# Patient Record
Sex: Male | Born: 1970
Health system: Southern US, Community
[De-identification: ages and names within clinical notes are randomized; demographics above are authoritative.]

## PROBLEM LIST (undated history)

## (undated) DIAGNOSIS — N186 End stage renal disease: Secondary | ICD-10-CM

## (undated) DIAGNOSIS — E079 Disorder of thyroid, unspecified: Secondary | ICD-10-CM

## (undated) DIAGNOSIS — N189 Chronic kidney disease, unspecified: Secondary | ICD-10-CM

## (undated) DIAGNOSIS — Z992 Dependence on renal dialysis: Secondary | ICD-10-CM

## (undated) DIAGNOSIS — IMO0002 Reserved for concepts with insufficient information to code with codable children: Secondary | ICD-10-CM

## (undated) DIAGNOSIS — M329 Systemic lupus erythematosus, unspecified: Secondary | ICD-10-CM

## (undated) HISTORY — DX: Chronic kidney disease, unspecified: N18.9

## (undated) HISTORY — DX: Disorder of thyroid, unspecified: E07.9

---

## 2004-05-10 ENCOUNTER — Ambulatory Visit (HOSPITAL_COMMUNITY): Admission: RE | Admit: 2004-05-10 | Discharge: 2004-05-10 | Payer: Self-pay | Admitting: Emergency Medicine

## 2004-05-10 ENCOUNTER — Ambulatory Visit: Payer: Self-pay | Admitting: Cardiology

## 2004-05-10 ENCOUNTER — Inpatient Hospital Stay (HOSPITAL_COMMUNITY): Admission: AD | Admit: 2004-05-10 | Discharge: 2004-05-13 | Payer: Self-pay | Admitting: Cardiology

## 2004-05-12 ENCOUNTER — Encounter: Payer: Self-pay | Admitting: Cardiology

## 2004-06-24 ENCOUNTER — Ambulatory Visit: Payer: Self-pay | Admitting: Cardiology

## 2006-11-21 ENCOUNTER — Emergency Department (HOSPITAL_COMMUNITY): Admission: EM | Admit: 2006-11-21 | Discharge: 2006-11-21 | Payer: Self-pay | Admitting: Emergency Medicine

## 2006-11-23 ENCOUNTER — Emergency Department (HOSPITAL_COMMUNITY): Admission: EM | Admit: 2006-11-23 | Discharge: 2006-11-23 | Payer: Self-pay | Admitting: Emergency Medicine

## 2007-09-14 ENCOUNTER — Ambulatory Visit: Payer: Self-pay | Admitting: Family Medicine

## 2007-09-14 ENCOUNTER — Inpatient Hospital Stay (HOSPITAL_COMMUNITY): Admission: AD | Admit: 2007-09-14 | Discharge: 2007-09-15 | Payer: Self-pay | Admitting: Family Medicine

## 2007-09-15 ENCOUNTER — Encounter: Payer: Self-pay | Admitting: Family Medicine

## 2010-09-09 NOTE — Discharge Summary (Signed)
[name], [name]               ACCOUNT NO.:  [id number]   MEDICAL RECORD NO.:  [id number]          PATIENT TYPE:  INP   LOCATION:  [id number]                         FACILITY:  [facility]   PHYSICIAN:  Talbert Cage, M.D.DATE OF BIRTH:  1971/03/31   DATE OF ADMISSION:  09/14/2007  DATE OF DISCHARGE:  09/15/2007                               DISCHARGE SUMMARY   .   REASON FOR HOSPITALIZATION:  Shortness of breath and right-sided chest  pain.   DISCHARGE DIAGNOSIS:  Lupus-related pleuritis with right-sided pleural  effusion.   ADDITIONAL DIAGNOSES:  1. Systemic lupus erythematosus.  2. Cardiomegaly.  3. Recurrent lupus pleuritis.   DISCHARGE MEDICATIONS:  1. Plaquenil 200 mg p.o. daily.  2. Prednisone 60 mg p.o. daily for the next 10 days.  3. Ibuprofen 800 mg three times daily for the next 10 days.  4. Percocet 5/325 mg 1-2 tabs every 4-6 hours as needed for chest      pain.   DISCHARGE INSTRUCTIONS:  1. The patient is taking medications as mentioned previously.  2. The patient is seeking medical attention for worsening chest pain,      shortness of breath, feeling like he will pass out, or any other      concerning symptoms.  3. The patient is to call Dr. Elmon Else office to seek followup      appointment sooner than it scheduled in August, preferably within      the next 2-3 weeks.   SIGNIFICANT FINDINGS:  1. Admission workup.  The patient presented as a direct admission from      [facility] with an outside chest x-ray showed a right-sided      pleural effusion.  Chest x-ray was repeated at [facility] and      corroborated the right-sided pleural effusion that was at moderate      size.  Cardiomegaly was also demonstrated on this chest x-ray.      This looked to me like an interval decrease from previous chest x-      ray that was done in July 2008, although that chest x-ray showed      cardiomegaly as well.  Lateral decubitus film was obtained and      showed  free-flowing right-sided pleural effusion.  Laboratory      workup showed a CBC with a white count of 5.5, hemoglobin of 12.5,      and platelet count of 304.  Complete metabolic panel was completely      within normal limits except for slightly low sodium at 134 and      slightly low potassium at 3.4 and a slightly low albumin at 3.0.      Cardiac enzymes were cycled x1 and found to be negative.      Erythrocyte sedimentation rate was elevated at 91 and C-reactive      protein was also elevated at 17.9.  2. Further inpatient workup given the cardiomegaly demonstrated on      chest x-ray.  The patient was sent for echocardiogram at the time      of this dictation.  That echocardiogram has not been read and will      be pending at the time of the patient's discharge.   BRIEF HOSPITAL COURSE:  The patient is a 40 year old man diagnosed with  lupus in 2006.  He presented to [facility] with shortness of  breath and oxygen saturation of 91%.  The patient states that for the  previous 2-3 days, he has not been able to a deep breath without sharp  right-sided pain.  He has also not been able to lay flat.  He denied any  chest heaviness or chest pressure.  Given the patient's past history of  pleural effusions of the same sort that had responded well to  prednisone, the patient was admitted and placed on oral prednisone 60 mg  once daily.  The patient was also placed on IV Toradol scheduled and was  given morphine and Percocet for pain control.  The patient did well with  this regimen and by hospital day #2 was breathing more comfortably.  He  had stable oxygen saturations and stable vital signs.  The patient  remained afebrile throughout his hospital course, did not demonstrate  any rash or indication of an infectious etiology.  Given the patient's  demonstrated cardiomegaly on chest x-ray that looked like it possibly  might  be marginally increased from previous chest x-ray in July  2008.  Cardiac echocardiogram was ordered.  EKG was performed on this admission  and did not show findings consistent with pericarditis, pericardial  effusion, myocarditis, or other cardiac myopathy.  Echocardiogram was  ordered to evaluate chest x-ray findings further and provided a baseline  for the patient showed subsequent evaluation and workup be necessary.  The patient did not demonstrate any signs of an infected pericarditis or  any other significant cardiac pathology.  On hospital day #2, the  patient was discharged to complete a 9-day course of prednisone with  instructions to call Dr. Dr. Elmon Else office to see if he could be seen  before his next scheduled appointment in August.  The patient expresses  agreement and understanding with this plan and is agreeable to discharge  to home.   PROCEDURES:  None.   CONSULTATIONS:  None.   ISSUES FOR FOLLOWUP:  Pending issues at the time of discharge.  The  patient has cardiac echo that was performed on the date of discharge,  Sep 15, 2007.  His cardiac echo has not been read yet.  We have asked  our echo lab to fax a copy of the read to Dr. Elmon Else office.  They  have agreed to do this but if Dr. Charlestine Night has not received this fax, he  should call [facility] to have a copy of this read sent to his  office.   DISCHARGE CONDITION:  Stable, good.   DISPOSITION:  The patient is discharged to home.      Eugenie Norrie, MD  Electronically Signed      Talbert Cage, M.D.  Electronically Signed    TE/MEDQ  D:  09/15/2007  T:  09/16/2007  Job:  [id number]

## 2010-09-09 NOTE — H&P (Signed)
[name], [name]               ACCOUNT NO.:  [id number]   MEDICAL RECORD NO.:  [id number]          PATIENT TYPE:  INP   LOCATION:  [id number]                         FACILITY:  [facility]   PHYSICIAN:  Blane Ohara McDiarmid, M.D.DATE OF BIRTH:  1970/07/17   DATE OF ADMISSION:  09/14/2007  DATE OF DISCHARGE:                              HISTORY & PHYSICAL   REASON FOR HOSPITALIZATION:  Shortness of breath with reported right  pleural effusion on outside chest x-ray.   HISTORY OF PRESENT ILLNESS:  The patient is a 40 year old male diagnosed  with lupus in 2006, who presents as a direct admit from [facility] with a complaint of shortness of breath and chest pain on deep  inspiration.  Chest x-ray at [facility] showed a right pleural  effusion with cardiomegaly and an O2 sat was performed there that read  91% on room air.  [facility] doctor discussed the patient with  his rheumatologist, Dr. Charlestine Night, who advised admission directly to [facility].  The patient reports a sharp right-sided chest  pain with deep inspiration, radiating to his right shoulder.  He also  complains of shortness of breath with lying supine.  He has to maintain  a posture of 90 degrees upright to keep from getting short of breath.  The patient denies any chest pressure or heaviness.  He denies any  nausea and vomiting.  He does report subjective chills and sweats.  Denies fevers.  He denies a rash or history of rash with his history of  lupus.  The patient denies any problems with gait or speech.  He states  that his pain is almost gone  as long as he sits upright and he is  taking shallow breath.  The patient reports he had an episode similar to  this about 1 year ago that responded well to a course of prednisone.  The patient thinks the flares associated with his lupus might come on  with increased stress, which has been the case for him lately, as he has  had an increased work  load and stress at work.   ALLERGIES:  No known drug allergies.   MEDICATIONS:  1. Plaquenil 200 mg p.o. daily.  2. Ibuprofen as needed.   PAST MEDICAL HISTORY:  1. Lupus, diagnosed in 2006.  2. Cardiomegaly.  3. History of lupus.  4. Pneumonitis.   FAMILY HISTORY:  Negative for hypertension, coronary artery disease, and  diabetes.  Positive rheumatoid arthritis in his family.  The patient has  no other family members with lupus.   SOCIAL HISTORY:  The patient lives with room mates, in [place].  He  works for [employer] and has an increased work load and stress, now  that prom season has started.   REVIEW OF SYSTEMS:  Negative for chest pressure or heaviness.  No  nausea, vomiting, or diarrhea.  No bowel or bladder problems.  The  patient denies rash.  He does have positional shortness of breath.  He  does have chills and sweats.  No fever.  The patient denies  any problems  with vision or gait.  He denies any numbness or tingling.   PHYSICAL EXAMINATION:  VITAL SIGNS:  At [place], temperature 98.1,  respiratory rate 16, heart rate 85, and blood pressure 112/75.  GENERAL:  No acute distress, sitting upright, talks incomplete  sentences, has subtle shallowed breathing.  HEENT:  Conjunctivae pink.  Sclerae are clear.  The extraocular muscles  are intact.  CARDIOVASCULAR:  Regular rate and rhythm.  No murmurs, gallops, or rubs.  NECK:  There is no evidence of JVD, on exam.  LUNGS:  The patient exhibits shallow respirations.  Decreased breath  sounds are noted at the right base.  There are positive rales in the  right base and he is also dull to percussion in the right base.  His  left side is normal to auscultation and percussion.  ABDOMEN:  Positive bowel sounds.  Soft, nontender, and nondistended.  EXTREMITIES:  Distal pulses 2+.  No cyanosis, clubbing, or edema.  SKIN:  Warm with good turgor, well-perfused.  There is no rash on the  extensor surfaces or otherwise.    Chest x-ray from [place] read by [facility] radiologist  is significant for right-sided pleural effusion with cardiomegaly.  Cardiac silhouette appears increased from chest x-ray performed in July  2008, although cardiomegaly was noted on this chest x-ray as well.   EKG:  There is no diffuse ST-elevation that would indicate pericarditis.  There is evidence of LV overload.  There is normal QRS voltage.  Normal  sinus rhythm.  There is T-wave inversion noticed in aVF in lead 3.  The  patient does not present with any lab work.   ASSESSMENT AND PLAN:  The patient is a 40 year old male who presents  [facility] with shortness of breath and evidence for right-sided  pleural effusion on chest x-ray at an outside facility.  1. Pulmonary.  We will follow the patient's O2 saturations.  We will      consider obtaining a lateral decubitus film for evaluation for a      possible therapeutic tap.  Currently, we will start the patient on      prednisone 60 mg p.o. daily with consideration for conservative      management, waiting for his pleural effusion to resorb, and      monitoring for a clinical improvement.  Supplemental O2 as      necessary for decreased oxygen saturation.  The patient does not      appear to be in respiratory distress at this time.  2. Cardiac.  Cardiomegaly is demonstrated on chest x-ray.  Although      this does not appear to be pericarditis, we will obtain a      transthoracic echo to evaluate for pericardial effusion,      pericarditis, myocarditis, or cardiac myopathy.  We will also      obtain one set of cardiac enzymes x1.  We will also obtain a repeat      EKG while the patient is here.  Steroids as mentioned previously      for pleural effusion.  We will likely provide the treatment for      possible pericarditis or pericardial effusion.  If the patient does      appear to be symptomatic, consider pericardiocentesis.  We will      follow an EKG  and cardiac enzymes as already mentioned.  3. Lupus.  We will continue the patient on his home Plaquenil  200 mg      p.o. daily.  We will also add prednisone 60 mg p.o. daily as well.      We will obtain the baseline set of electrolytes including a      complete metabolic panel.  We will also obtain CBC with      differential.  The patient has not demonstrated any fevers and this      does not appear to be an infection-related phenomenon but problems      of pulmonary and cardiac are most likely related to the patient's      lupus.  4. Prophylaxis.  We will allow the patient to ambulate ad lib.  5. FEN/GI.  We will allow the patient to have a regular diet and we      will not provide any IV fluids at this time.   DISPOSITION:  Pending.   FOLLOWUP:  Chest x-ray, Echocardiography, cardiac enzymes, and followup  with the lab work.   The patient is admitted for pain control, for initiation of therapy for  this likely lupus-related exacerbation and inflammatory process.  We  will monitor the patient for clinical improvement and initiate the  management as indicated, including the possibility of therapeutic tap  and possibly the pericardiocentesis or further cardiac evaluation beyond  transthoracic echo.      Eugenie Norrie, MD  Electronically Signed      Blane Ohara McDiarmid, M.D.  Electronically Signed    TE/MEDQ  D:  09/14/2007  T:  09/15/2007  Job:  [id number]
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
## 2010-09-12 NOTE — H&P (Signed)
[name], [name]               ACCOUNT NO.:  [id number]   MEDICAL RECORD NO.:  [id number]          PATIENT TYPE:  INP   LOCATION:  2035                         FACILITY:  [facility]   PHYSICIAN:  Iona Beard L. Andree Elk, M.D.  DATE OF BIRTH:  1970/07/09   DATE OF ADMISSION:  05/10/2004  DATE OF DISCHARGE:                                HISTORY & PHYSICAL   His primary ID is Dr. Delaney Meigs, who is in family medicine, who referred him  here today.  He has no cardiologist.  It is May 10, 2004, at 8 p.m.   CHIEF COMPLAINT:  Chest pain, presyncope.   HISTORY OF PRESENT ILLNESS:  A 40 year old Caucasian male with no past  medical history presents with presyncope and chest pain to Dr. Delaney Meigs, at the  family medicine urgent care clinic.  The patient states for approximately 1-  1/2 years he has had some left upper shoulder pain, sharp in nature, which  has progressively gotten worse, especially over the last 3 weeks.  The chest  pain has progressed to the point that it is in the left part of his lower  chest in addition to the left upper chest.  He states laying down or laying  flat makes the pain worse and sitting up relieves it.  He also has had a  fever which has been agile in nature, today it was 101.  He denies shortness  of breath, no nausea, vomiting.  He does have diaphoresis.  No PND.  He does  have a cough, no lower extremity edema.  Does have orthopnea.  No lumps or  bumps or rashes.  No loss or gain of weight.  No problems with urination or  bowel movements.  The reason the patient presented to the family care doctor  is because symptoms progressed to the point he became presyncope last night.  His family medicine doctor called me when he referred him to get a chest CT.  I saw the patient in the CT scanner and evaluated him and directly admitted  him to room 2035.   PAST MEDICAL HISTORY:  He has none.   REVIEW OF SYSTEMS:  No recent travel, no sick contacts.   CT scan of the chest  showed a 1.________-cm pericardial effusion and also a  left pleural effusion, no tamponade.   SOCIAL HISTORY:  Lives with his wife.  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  He  has no insurance.  No tobacco, alcohol or drugs.   FAMILY HISTORY:  Mother died of cancer.  Father is alive, in 40s, and  healthy.  Siblings healthy x2.   REVIEW OF SYSTEMS:  Positive for fevers, chills, sweats, chest pain,  orthopnea or presyncope.   PHYSICAL EXAMINATION:  In general, alert and oriented x3 with pain laying  flat mild in nature.  His pulse was 110, other vitals are pending.  NECK:  JVD is negligible, no bruits.  LYMPHADENOPATHY:  None.  CARDIOVASCULAR:  Normal S1, S2.  A questionable rub.  LUNGS:  Decreased breath sounds approximately 1/4 the way up in the left  lung base,  otherwise clear to auscultation.  SKIN:  No rashes or lesions.  ABDOMEN:  Soft, nontender, nondistended.  Positive bowel sounds.  EXTREMITIES:  No clubbing, cyanosis or edema.  Strong pulses.   CHEST X-RAY:  Cardiomegaly, left pleural effusion.   LABS:  Pending.   EKG:  Pending.   ASSESSMENT AND PLAN:  Pericarditis with pericardial effusion, most likely  post-viral, but considering symptomatology and CT scan results will complete  the workup.  I will order a CBC with differential, viral titer, __________,  ESR, ANA, rheumatoid factor, place a PPD, Chem-7, blood cultures x2, cardiac  enzymes x1.  Will start nafcillin concerning for bacterial etiology, after  the cultures are performed, and also given nonsteroidal anti-inflammatory  drugs q.8 hours for pain.  Patient is Full Code.       GLA/MEDQ  D:  05/10/2004  T:  05/10/2004  Job:  [id number]

## 2011-01-21 LAB — COMPREHENSIVE METABOLIC PANEL
ALT: 35
AST: 25
Albumin: 3 — ABNORMAL LOW
Alkaline Phosphatase: 83
BUN: 10
CO2: 28
Calcium: 8.5
Chloride: 99
Creatinine, Ser: 0.85
GFR calc Af Amer: >60
GFR calc non Af Amer: >60
Glucose, Bld: 99
Potassium: 3.4 — ABNORMAL LOW
Sodium: 134 — ABNORMAL LOW
Total Bilirubin: 0.8
Total Protein: 8.2

## 2011-01-21 LAB — CBC
HCT: 36.9 — ABNORMAL LOW
HCT: 37.7 — ABNORMAL LOW
Hemoglobin: 12.5 — ABNORMAL LOW
Hemoglobin: 12.9 — ABNORMAL LOW
MCHC: 33.9
MCHC: 34.1
MCV: 85.8
MCV: 86.4
Platelets: 301
Platelets: 304
RBC: 4.27
RBC: 4.4
RDW: 12.9
RDW: 13.3
WBC: 5.5
WBC: 6.4

## 2011-01-21 LAB — DIFFERENTIAL
Basophils Absolute: 0
Basophils Relative: 0
Eosinophils Absolute: 0.1
Eosinophils Relative: 1
Lymphocytes Relative: 19
Lymphs Abs: 1.2
Monocytes Absolute: 0.7
Monocytes Relative: 10
Neutro Abs: 4.5
Neutrophils Relative %: 70

## 2011-01-21 LAB — SEDIMENTATION RATE: Sed Rate: 91 — ABNORMAL HIGH

## 2011-01-21 LAB — CARDIAC PANEL(CRET KIN+CKTOT+MB+TROPI)
CK, MB: 0.6
Relative Index: INVALID
Total CK: 33
Troponin I: 0.01

## 2011-01-21 LAB — C-REACTIVE PROTEIN: CRP: 17.9 — ABNORMAL HIGH (ref <0.6)

## 2011-02-09 LAB — POCT CARDIAC MARKERS
CKMB, poc: <1 — ABNORMAL LOW
Myoglobin, poc: 48.5
Operator id: 277751
Troponin i, poc: <0.05

## 2011-02-09 LAB — I-STAT 8, (EC8 V) (CONVERTED LAB)
Acid-Base Excess: 2
BUN: 13
Bicarbonate: 28.5 — ABNORMAL HIGH
Chloride: 103
Glucose, Bld: 122 — ABNORMAL HIGH
HCT: 41
Hemoglobin: 13.9
Operator id: 189501
Potassium: 4.3
Sodium: 138
TCO2: 30
pCO2, Ven: 49.9
pH, Ven: 7.365 — ABNORMAL HIGH

## 2011-02-09 LAB — CBC
HCT: 38.1 — ABNORMAL LOW
HCT: 38.3 — ABNORMAL LOW
Hemoglobin: 13
Hemoglobin: 13
MCHC: 33.9
MCHC: 34.2
MCV: 85.9
MCV: 85.9
Platelets: 312
Platelets: 405 — ABNORMAL HIGH
RBC: 4.43
RBC: 4.46
RDW: 11.9
RDW: 12
WBC: 5.9
WBC: 6.5

## 2011-02-09 LAB — DIFFERENTIAL
Basophils Absolute: 0
Basophils Absolute: 0
Basophils Relative: 0
Basophils Relative: 0
Eosinophils Absolute: 0
Eosinophils Absolute: 0.1
Eosinophils Relative: 0
Eosinophils Relative: 1
Lymphocytes Relative: 13
Lymphocytes Relative: 18
Lymphs Abs: 0.8
Lymphs Abs: 1.2
Monocytes Absolute: 0.2
Monocytes Absolute: 0.5
Monocytes Relative: 4
Monocytes Relative: 8
Neutro Abs: 4.7
Neutro Abs: 4.9
Neutrophils Relative %: 73
Neutrophils Relative %: 83 — ABNORMAL HIGH

## 2011-02-09 LAB — COMPREHENSIVE METABOLIC PANEL
ALT: 24
AST: 21
Albumin: 3.1 — ABNORMAL LOW
Alkaline Phosphatase: 79
BUN: 12
CO2: 30
Calcium: 8.9
Chloride: 101
Creatinine, Ser: 0.91
GFR calc Af Amer: >60
GFR calc non Af Amer: >60
Glucose, Bld: 90
Potassium: 3.9
Sodium: 137
Total Bilirubin: 0.5
Total Protein: 7.7

## 2011-02-09 LAB — POCT I-STAT CREATININE
Creatinine, Ser: 0.7
Operator id: 189501

## 2011-02-09 LAB — B-NATRIURETIC PEPTIDE (CONVERTED LAB): Pro B Natriuretic peptide (BNP): 31

## 2011-04-26 ENCOUNTER — Ambulatory Visit: Payer: Self-pay

## 2011-04-26 DIAGNOSIS — J159 Unspecified bacterial pneumonia: Secondary | ICD-10-CM

## 2012-09-02 ENCOUNTER — Ambulatory Visit: Payer: Self-pay | Admitting: Family Medicine

## 2012-09-02 ENCOUNTER — Ambulatory Visit: Payer: Self-pay

## 2012-09-02 VITALS — BP 128/80 | HR 82 | Temp 98.3°F | Resp 16 | Ht 71.0 in | Wt 190.0 lb

## 2012-09-02 DIAGNOSIS — R0789 Other chest pain: Secondary | ICD-10-CM

## 2012-09-02 DIAGNOSIS — R0781 Pleurodynia: Secondary | ICD-10-CM

## 2012-09-02 DIAGNOSIS — R079 Chest pain, unspecified: Secondary | ICD-10-CM

## 2012-09-02 MED ORDER — HYDROCODONE-ACETAMINOPHEN 5-325 MG PO TABS: 1.0000 | ORAL_TABLET | Freq: Every evening | ORAL | Status: DC | PRN
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
## 2012-09-19 ENCOUNTER — Telehealth: Payer: Self-pay

## 2012-09-19 NOTE — Telephone Encounter (Signed)
Pt is calling to get results of xrays done last week of ribs and sternum Please call pt to advise

## 2012-09-20 NOTE — Telephone Encounter (Signed)
No rib fx no sternal fx. Have called patient to advise. Left message. He is advised to call back if he has further questions

## 2015-03-09 ENCOUNTER — Ambulatory Visit (INDEPENDENT_AMBULATORY_CARE_PROVIDER_SITE_OTHER): Payer: Self-pay

## 2015-03-09 ENCOUNTER — Ambulatory Visit (INDEPENDENT_AMBULATORY_CARE_PROVIDER_SITE_OTHER): Payer: Self-pay | Admitting: Emergency Medicine

## 2015-03-09 VITALS — BP 148/62 | HR 87 | Temp 98.8°F | Resp 16 | Ht 72.0 in | Wt 204.4 lb

## 2015-03-09 DIAGNOSIS — M329 Systemic lupus erythematosus, unspecified: Secondary | ICD-10-CM

## 2015-03-09 DIAGNOSIS — M3213 Lung involvement in systemic lupus erythematosus: Secondary | ICD-10-CM | POA: Insufficient documentation

## 2015-03-09 MED ORDER — CHLOROQUINE PHOSPHATE 250 MG PO TABS: 250.0000 mg | ORAL_TABLET | Freq: Every day | ORAL | Status: DC

## 2015-03-09 NOTE — Progress Notes (Signed)
Subjective:  Patient ID: [name], male    DOB: 01-Jul-1970  Age: 44 y.o. MRN: [id number]  CC: Edema and Lupus   HPI [name] presents   Patient with a history of systemic lupus erythematosus. He has not seen his rheumatologist for 4 years. Is not taking any maintenance medication. He over the last several days noticed a progressive increase swelling and discomfort in his fingers particularly the metacarpophalangeal and PIP joints. He has no history of injury or overuse. He has shortness of breath with exertion. Says he is unable take a deep breath without pain in his left chest. He denies any cough fever chills nausea vomiting or other complaints. He has no history of injury.  History [name] has no past medical history on file.   He has no past surgical history on file.   His  family history is not on file.  He   reports that he has never smoked. He does not have any smokeless tobacco history on file. He reports that he does not drink alcohol or use illicit drugs.  Outpatient Prescriptions Prior to Visit  Medication Sig Dispense Refill  . HYDROcodone-acetaminophen (NORCO/VICODIN) 5-325 MG per tablet Take 1 tablet by mouth at bedtime as needed for pain. (Patient not taking: Reported on 03/09/2015) 10 tablet 0   No facility-administered medications prior to visit.    Social History   Social History  . Marital Status: Single    Spouse Name: N/A  . Number of Children: N/A  . Years of Education: N/A   Social History Main Topics  . Smoking status: Never Smoker   . Smokeless tobacco: None  . Alcohol Use: No  . Drug Use: No  . Sexual Activity: Not Asked   Other Topics Concern  . None   Social History Narrative     Review of Systems  Constitutional: Negative for fever, chills and appetite change.  HENT: Negative for congestion, ear pain, postnasal drip, sinus pressure and sore throat.   Eyes: Negative for pain and redness.  Respiratory: Positive for cough  and shortness of breath. Negative for wheezing.   Cardiovascular: Negative for leg swelling.  Gastrointestinal: Negative for nausea, vomiting, abdominal pain, diarrhea, constipation and blood in stool.  Endocrine: Negative for polyuria.  Genitourinary: Negative for dysuria, urgency, frequency and flank pain.  Musculoskeletal: Negative for gait problem.  Skin: Negative for rash.  Neurological: Negative for weakness and headaches.  Psychiatric/Behavioral: Negative for confusion and decreased concentration. The patient is not nervous/anxious.     Objective:  BP 148/62 mmHg  Pulse 87  Temp(Src) 98.8 F (37.1 C) (Oral)  Resp 16  Ht 6' (1.829 m)  Wt 204 lb 6.4 oz (92.715 kg)  BMI 27.72 kg/m2  SpO2 96%  Physical Exam  Constitutional: He is oriented to person, place, and time. He appears well-developed and well-nourished. No distress.  HENT:  Head: Normocephalic and atraumatic.  Right Ear: External ear normal.  Left Ear: External ear normal.  Nose: Nose normal.  Eyes: Conjunctivae and EOM are normal. Pupils are equal, round, and reactive to light. No scleral icterus.  Neck: Normal range of motion. Neck supple. No tracheal deviation present.  Cardiovascular: Normal rate, regular rhythm and normal heart sounds.   Pulmonary/Chest: Effort normal. No respiratory distress. He has decreased breath sounds in the left middle field and the left lower field. He has no wheezes. He has no rales.  Abdominal: He exhibits no mass. There is no tenderness. There is no  rebound and no guarding.  Musculoskeletal: He exhibits no edema.  Lymphadenopathy:    He has no cervical adenopathy.  Neurological: He is alert and oriented to person, place, and time. Coordination normal.  Skin: Skin is warm and dry. No rash noted.  Psychiatric: He has a normal mood and affect. His behavior is normal.      Assessment & Plan:   [name] was seen today for edema and lupus.  Diagnoses and all orders for this  visit:  Lupus (systemic lupus erythematosus) ([place]) -     Ambulatory referral to Rheumatology -     DG Chest 2 View; Future  Other orders -     chloroquine (ARALEN) 250 MG tablet; Take 1 tablet (250 mg total) by mouth daily.   I am having [name] on chloroquine. I am also having him maintain his HYDROcodone-acetaminophen and ibuprofen.  Meds ordered this encounter  Medications  . ibuprofen (ADVIL,MOTRIN) 200 MG tablet    Sig: Take 200 mg by mouth every 6 (six) hours as needed.  . chloroquine (ARALEN) 250 MG tablet    Sig: Take 1 tablet (250 mg total) by mouth daily.    Dispense:  30 tablet    Refill:  2     he was referred to the emergency room for further evaluation treatment of his pleural effusion  Appropriate red flag conditions were discussed with the patient as well as actions that should be taken.  Patient expressed his understanding.  Follow-up: Return if symptoms worsen or fail to improve.  Roselee Culver, MD  [facility] reading (PRIMARY) by  Dr. Ouida Sills.  Large left pleural effusion.

## 2015-03-10 ENCOUNTER — Observation Stay (HOSPITAL_COMMUNITY)
Admission: EM | Admit: 2015-03-10 | Discharge: 2015-03-11 | Disposition: A | Discharge status: Home / Self Care | Payer: Self-pay | Attending: Internal Medicine | Admitting: Internal Medicine

## 2015-03-10 ENCOUNTER — Encounter (HOSPITAL_COMMUNITY): Payer: Self-pay | Admitting: Emergency Medicine

## 2015-03-10 ENCOUNTER — Emergency Department (HOSPITAL_COMMUNITY): Payer: MEDICAID

## 2015-03-10 ENCOUNTER — Emergency Department (HOSPITAL_COMMUNITY): Payer: Self-pay

## 2015-03-10 DIAGNOSIS — D72819 Decreased white blood cell count, unspecified: Secondary | ICD-10-CM | POA: Insufficient documentation

## 2015-03-10 DIAGNOSIS — M3213 Lung involvement in systemic lupus erythematosus: Secondary | ICD-10-CM | POA: Diagnosis present

## 2015-03-10 DIAGNOSIS — Z9889 Other specified postprocedural states: Secondary | ICD-10-CM | POA: Insufficient documentation

## 2015-03-10 DIAGNOSIS — R0600 Dyspnea, unspecified: Secondary | ICD-10-CM | POA: Diagnosis present

## 2015-03-10 DIAGNOSIS — Z9981 Dependence on supplemental oxygen: Secondary | ICD-10-CM | POA: Insufficient documentation

## 2015-03-10 DIAGNOSIS — M329 Systemic lupus erythematosus, unspecified: Principal | ICD-10-CM | POA: Insufficient documentation

## 2015-03-10 DIAGNOSIS — J90 Pleural effusion, not elsewhere classified: Secondary | ICD-10-CM | POA: Insufficient documentation

## 2015-03-10 HISTORY — DX: Systemic lupus erythematosus, unspecified: M32.9

## 2015-03-10 HISTORY — DX: Reserved for concepts with insufficient information to code with codable children: IMO0002

## 2015-03-10 LAB — CBC WITH DIFFERENTIAL/PLATELET
Basophils Absolute: 0 10*3/uL (ref 0.0–0.1)
Basophils Relative: 0 %
Eosinophils Absolute: 0 10*3/uL (ref 0.0–0.7)
Eosinophils Relative: 0 %
HCT: 38.5 % — ABNORMAL LOW (ref 39.0–52.0)
Hemoglobin: 13.3 g/dL (ref 13.0–17.0)
Lymphocytes Relative: 16 %
Lymphs Abs: 0.5 10*3/uL — ABNORMAL LOW (ref 0.7–4.0)
MCH: 30.9 pg (ref 26.0–34.0)
MCHC: 34.5 g/dL (ref 30.0–36.0)
MCV: 89.5 fL (ref 78.0–100.0)
Monocytes Absolute: 0.1 10*3/uL (ref 0.1–1.0)
Monocytes Relative: 3 %
Neutro Abs: 2.4 10*3/uL (ref 1.7–7.7)
Neutrophils Relative %: 81 %
Platelets: 247 10*3/uL (ref 150–400)
RBC: 4.3 MIL/uL (ref 4.22–5.81)
RDW: 12.1 % (ref 11.5–15.5)
WBC: 3 10*3/uL — ABNORMAL LOW (ref 4.0–10.5)

## 2015-03-10 LAB — I-STAT CHEM 8, ED
BUN: 12 mg/dL (ref 6–20)
Calcium, Ion: 1.1 mmol/L — ABNORMAL LOW (ref 1.12–1.23)
Chloride: 107 mmol/L (ref 101–111)
Creatinine, Ser: 0.8 mg/dL (ref 0.61–1.24)
Glucose, Bld: 115 mg/dL — ABNORMAL HIGH (ref 65–99)
HCT: 41 % (ref 39.0–52.0)
Hemoglobin: 13.9 g/dL (ref 13.0–17.0)
Potassium: 4.3 mmol/L (ref 3.5–5.1)
Sodium: 141 mmol/L (ref 135–145)
TCO2: 20 mmol/L (ref 0–100)

## 2015-03-10 LAB — I-STAT TROPONIN, ED: Troponin i, poc: 0 ng/mL (ref 0.00–0.08)

## 2015-03-10 MED ORDER — METHYLPREDNISOLONE SODIUM SUCC 40 MG IJ SOLR
40.0000 mg | Freq: Every day | INTRAMUSCULAR | Status: DC
  Administered 2015-03-11: 40 mg via INTRAVENOUS
  Filled 2015-03-10: qty 1

## 2015-03-10 MED ORDER — METHYLPREDNISOLONE SODIUM SUCC 125 MG IJ SOLR
125.0000 mg | Freq: Once | INTRAMUSCULAR | Status: AC
  Administered 2015-03-10: 125 mg via INTRAVENOUS
  Filled 2015-03-10: qty 2

## 2015-03-10 MED ORDER — IOHEXOL 350 MG/ML SOLN
100.0000 mL | Freq: Once | INTRAVENOUS | Status: AC | PRN
  Administered 2015-03-10: 100 mL via INTRAVENOUS

## 2015-03-10 MED ORDER — HYDROCODONE-ACETAMINOPHEN 5-325 MG PO TABS: 1.0000 | ORAL_TABLET | ORAL | Status: DC | PRN

## 2015-03-10 MED ORDER — ONDANSETRON HCL 4 MG PO TABS: 4.0000 mg | ORAL_TABLET | Freq: Four times a day (QID) | ORAL | Status: DC | PRN

## 2015-03-10 MED ORDER — ENOXAPARIN SODIUM 40 MG/0.4ML ~~LOC~~ SOLN
40.0000 mg | SUBCUTANEOUS | Status: DC
  Administered 2015-03-10: 40 mg via SUBCUTANEOUS
  Filled 2015-03-10: qty 0.4

## 2015-03-10 MED ORDER — KETOROLAC TROMETHAMINE 30 MG/ML IJ SOLN
30.0000 mg | Freq: Four times a day (QID) | INTRAMUSCULAR | Status: DC
  Administered 2015-03-10 – 2015-03-11 (×2): 30 mg via INTRAVENOUS
  Filled 2015-03-10 (×6): qty 1

## 2015-03-10 MED ORDER — SODIUM CHLORIDE 0.9 % IJ SOLN
3.0000 mL | Freq: Two times a day (BID) | INTRAMUSCULAR | Status: DC
  Administered 2015-03-10 – 2015-03-11 (×2): 3 mL via INTRAVENOUS

## 2015-03-10 MED ORDER — KETOROLAC TROMETHAMINE 30 MG/ML IJ SOLN
30.0000 mg | Freq: Once | INTRAMUSCULAR | Status: AC
  Administered 2015-03-10: 30 mg via INTRAVENOUS
  Filled 2015-03-10: qty 1

## 2015-03-10 MED ORDER — HYDROMORPHONE HCL 1 MG/ML IJ SOLN
1.0000 mg | INTRAMUSCULAR | Status: DC | PRN
  Administered 2015-03-11: 1 mg via INTRAVENOUS
  Filled 2015-03-10: qty 1

## 2015-03-10 MED ORDER — ONDANSETRON HCL 4 MG/2ML IJ SOLN: 4.0000 mg | Freq: Four times a day (QID) | INTRAMUSCULAR | Status: DC | PRN

## 2015-03-10 NOTE — ED Notes (Addendum)
Pt c/o fluid on the lungs. He reports he was seen at [facility] today and they told him to come here for fluid on L lung. Hx of thoracentesis. He reports shortness of breath

## 2015-03-10 NOTE — ED Notes (Signed)
Pt's O2 sat on room air fluctuated between 89% and 93% while ambulating in the hall.

## 2015-03-10 NOTE — ED Notes (Signed)
Discontinue DG Chest 2 View, per EDP's verbal order.

## 2015-03-10 NOTE — ED Notes (Signed)
Pt and pt's sister refused to have chest xray done---- stated, "I just had an x-ray a few hours ago and they told me that you can access the results.  I will not be paying double."

## 2015-03-10 NOTE — ED Notes (Addendum)
Resting quietly with eye closed. Easily arousable. Verbally responsive. A/O x4. Resp even and unlabored. ABC's intact. SR on monitor. IV saline lock patent and intact. Sister at bedside.

## 2015-03-10 NOTE — Progress Notes (Signed)
Pt signed out to me this AM. No holding orders placed yet. Asked for tele bed.  Faye Ramsay, MD  [facility] Pager [phone]  If 7PM-7AM, please contact night-coverage [email or web address] Password [email or web address]

## 2015-03-10 NOTE — ED Notes (Addendum)
Dr. Sabra Heck, admitting MD at bedside.

## 2015-03-10 NOTE — ED Provider Notes (Signed)
CSN: [id number]     Arrival date & time 03/10/15  0231 History   First MD Initiated Contact with Patient 03/10/15 [phone]     Chief Complaint  Patient presents with  . Lupus  . Pleural Effusion     (Consider location/radiation/quality/duration/timing/severity/associated sxs/prior Treatment) Patient is a 44 y.o. male presenting with shortness of breath. The history is provided by the patient.  Shortness of Breath Severity:  Moderate Onset quality:  Gradual Timing:  Constant Progression:  Worsening Chronicity:  Recurrent Context: activity   Relieved by:  Nothing Worsened by:  Nothing tried Ineffective treatments:  None tried Associated symptoms: no abdominal pain, no diaphoresis, no fever, no neck pain, no swollen glands, no vomiting and no wheezing   Associated symptoms comment:  Joint pain and swelling Risk factors: no recent surgery     Past Medical History  Diagnosis Date  . Lupus ([place])    History reviewed. No pertinent past surgical history. No family history on file. Social History  Substance Use Topics  . Smoking status: Never Smoker   . Smokeless tobacco: None  . Alcohol Use: No    Review of Systems  Constitutional: Negative for fever and diaphoresis.  Respiratory: Positive for shortness of breath. Negative for wheezing.   Cardiovascular: Negative for palpitations.  Gastrointestinal: Negative for vomiting and abdominal pain.  Musculoskeletal: Negative for neck pain.  All other systems reviewed and are negative.     Allergies  Review of patient's allergies indicates no known allergies.  Home Medications   Prior to Admission medications   Medication Sig Start Date End Date Taking? Authorizing Provider  ibuprofen (ADVIL,MOTRIN) 200 MG tablet Take 400 mg by mouth every 6 (six) hours as needed for moderate pain.    Yes Historical Provider, MD  chloroquine (ARALEN) 250 MG tablet Take 1 tablet (250 mg total) by mouth daily. Patient not taking: Reported on  03/10/2015 03/09/15   Roselee Culver, MD   BP 126/81 mmHg  Pulse 92  Temp(Src) 97.9 F (36.6 C) (Oral)  Resp 20  SpO2 93% Physical Exam  Constitutional: He is oriented to person, place, and time. He appears well-developed and well-nourished. No distress.  HENT:  Head: Normocephalic and atraumatic.  Mouth/Throat: Oropharynx is clear and moist.  Eyes: Conjunctivae are normal. Pupils are equal, round, and reactive to light.  Neck: Normal range of motion. Neck supple.  Cardiovascular: Normal rate, regular rhythm and intact distal pulses.   Pulmonary/Chest: Effort normal. No respiratory distress. He has decreased breath sounds in the left middle field and the left lower field. He has no wheezes. He has no rhonchi. He has no rales.  Abdominal: Soft. Bowel sounds are normal. There is no tenderness. There is no rebound and no guarding.  Musculoskeletal: Normal range of motion.  Neurological: He is alert and oriented to person, place, and time.  Skin: Skin is warm and dry. He is not diaphoretic.  Psychiatric: He has a normal mood and affect.    ED Course  Procedures (including critical care time) Labs Review Labs Reviewed  CBC WITH DIFFERENTIAL/PLATELET - Abnormal; Notable for the following:    WBC 3.0 (*)    HCT 38.5 (*)    Lymphs Abs 0.5 (*)    All other components within normal limits  I-STAT CHEM 8, ED - Abnormal; Notable for the following:    Glucose, Bld 115 (*)    Calcium, Ion 1.10 (*)    All other components within normal limits  Randolm Idol, ED  Imaging Review Dg Chest 2 View  03/09/2015  CLINICAL DATA:  Chest pain, shortness of breath EXAM: CHEST  2 VIEW COMPARISON:  09/02/2012 FINDINGS: Moderate left pleural effusion. Associated left lower lobe opacity, likely compressive atelectasis. Right lung is clear. No pneumothorax. The heart is normal in size. Visualized osseous structures are within normal limits. IMPRESSION: Moderate left pleural effusion.  Electronically Signed   By: Julian Hy M.D.   On: 03/09/2015 15:41   Ct Angio Chest Pe W/cm &/or Wo Cm  03/10/2015  CLINICAL DATA:  Effusion with pain. EXAM: CT ANGIOGRAPHY CHEST WITH CONTRAST TECHNIQUE: Multidetector CT imaging of the chest was performed using the standard protocol during bolus administration of intravenous contrast. Multiplanar CT image reconstructions and MIPs were obtained to evaluate the vascular anatomy. CONTRAST:  177mL OMNIPAQUE IOHEXOL 350 MG/ML SOLN COMPARISON:  05/10/2004 FINDINGS: THORACIC INLET/BODY WALL: No acute abnormality. MEDIASTINUM: Normal heart size. No pericardial effusion. Motion degraded CTA but overall diagnostic to the segmental level and likely best obtainable- negative for pulmonary embolism. Negative thoracic aorta. No adenopathy. LUNG WINDOWS: There is a moderate layering left pleural effusion with borderline thin pleural enhancement dependently. Trace right pleural effusion. Multi segment left lower lobe atelectasis. Small pulmonary nodules in the right lower lobe on series 8, image 53 are stable and considered benign. UPPER ABDOMEN: No acute findings. OSSEOUS: Remote, healed sternal body fracture. No acute fracture. No suspicious lytic or blastic lesions. Review of the MIP images confirms the above findings. IMPRESSION: 1. No evidence of pulmonary embolism. 2. Moderate left and small right pleural effusion with multi segment left lower lobe atelectasis. Effusions were also seen by chest CT 05/10/2004, question relationship to patient's lupus. Electronically Signed   By: Monte Fantasia M.D.   On: 03/10/2015 06:40   I have personally reviewed and evaluated these images and lab results as part of my medical decision-making.   EKG Interpretation   Date/Time:  Sunday March 10 2015 05:06:39 EST Ventricular Rate:  98 PR Interval:  162 QRS Duration: 83 QT Interval:  353 QTC Calculation: 451 R Axis:   53 Text Interpretation:  Sinus rhythm Confirmed  by [facility]  MD, Burak Zerbe  ([id number]) on 03/10/2015 5:30:45 AM      MDM   Final diagnoses:  Lupus ([place])  Pleural effusion    Has history of pericarditis and pericardial effusion with pleural effusion with new O2 requirement will need admission for thoracentesis and medication stabilization.    Case d/w Dr. Eudelia Bunch, will sign off to am team     Deazia Lampi, MD 03/10/15 [phone]

## 2015-03-10 NOTE — H&P (Signed)
[facility] History and Physical  [name] [name] DOB: Jan 06, 1971 DOA: 03/10/2015  Referring physician: ED physician, [place]  PCP: No PCP Per Patient   Chief Complaint: dyspnea   HPI:  Pt is 44 yo male with known history of lupus on Chloroquine, presented to [facility] ED with main concern of several days duration of progressively worsening dyspnea that initially started with exertion and has progressed to dyspnea at rest over the past 24 - 48 hours, associated with malaise and poor oral intake. Pt denies fevers, chills, chest pain, no recent similar events.   In ED, pt noted to be hemodynamically stable with VSS, imaging studies notable for left pleural effusion. TRH asked to admit for evaluation and management. Pt started on solumedrol 125 mg IV in ED.   Assessment and Plan: Active Problems:   Acute respiratory distress with hypoxia due to Left sided pleural effusion, ? Related to lupus flare  - oxygen saturation 92%on 2 L oxygen in ED - will admit to telemetry unit - keep on oxygen and continue Solumedrol IV for now - IR consulted fot thoracentesis, Ok to be done in AM - will request fluid to be sent for analysis     Leukopenia - from lupus - CBC in AM  DVT prophylaxis - Lovenox SQ  Radiological Exams on Admission: Dg Chest 2 View 03/09/2015   Moderate left pleural effusion.  Ct Angio Chest Pe W/cm &/or Wo Cm 03/10/2015   No evidence of pulmonary embolism. 2. Moderate left and small right pleural effusion with multi segment left lower lobe atelectasis. Effusions were also seen by chest CT 05/10/2004, question relationship to patient's lupus.   Code Status: Full Family Communication: Pt and sister at bedside Disposition Plan: Admit for further evaluation    [name] [facility] [name]  Review of Systems:  Constitutional: Negative for diaphoresis.  HENT: Negative for hearing loss, ear pain, nosebleeds, congestion, sore throat, neck pain, tinnitus and ear  discharge.   Eyes: Negative for blurred vision, double vision, photophobia, pain, discharge and redness.  Respiratory: Negative for wheezing and stridor.   Cardiovascular: Negative for chest pain, palpitations, orthopnea, claudication and leg swelling.  Gastrointestinal: Negative for nausea, vomiting and abdominal pain. Genitourinary: Negative for dysuria, urgency, frequency, hematuria and flank pain.  Musculoskeletal: Negative for myalgias, back pain, joint pain and falls.  Skin: Negative for itching and rash.  Neurological: Negative for dizziness and weakness.  Endo/Heme/Allergies: Negative for environmental allergies and polydipsia. Does not bruise/bleed easily.  Psychiatric/Behavioral: Negative for suicidal ideas. The patient is not nervous/anxious.      Past Medical History  Diagnosis Date  . Lupus ([place])    Social History:  reports that he has never smoked. He does not have any smokeless tobacco history on file. He reports that he does not drink alcohol or use illicit drugs.  No Known Allergies  Pt denies family history of HTN or DM   Prior to Admission medications   Medication Sig Start Date End Date Taking? Authorizing Provider  ibuprofen (ADVIL,MOTRIN) 200 MG tablet Take 400 mg by mouth every 6 (six) hours as needed for moderate pain.    Yes Historical Provider, MD  chloroquine (ARALEN) 250 MG tablet Take 1 tablet (250 mg total) by mouth daily. Patient not taking: Reported on 03/10/2015 03/09/15   Roselee Culver, MD    Physical Exam: Filed Vitals:   03/10/15 0700 03/10/15 0730 03/10/15 0800 03/10/15 0830  BP: 121/81 122/70 116/83 120/85  Pulse: 94 91 90  85  Temp:      TempSrc:      Resp: 17 18 17 16   SpO2: 92% 97% 99% 98%    Physical Exam  Constitutional: Appears well-developed. No distress.  HENT: Normocephalic. External right and left ear normal.  Eyes: Conjunctivae and EOM are normal. PERRLA, no scleral icterus.  Neck: Normal ROM. Neck supple. No JVD. No  tracheal deviation. No thyromegaly.  CVS: RRR, S1/S2 +, no murmurs, no gallops, no carotid bruit.  Pulmonary: Effort and breath sounds normal, left sided crackles  Abdominal: Soft. BS +,  no distension, tenderness, rebound or guarding.  Musculoskeletal: Normal range of motion. No edema and no tenderness.  Lymphadenopathy: No lymphadenopathy noted, cervical, inguinal. Neuro: Alert. Normal reflexes, muscle tone coordination. No cranial nerve deficit. Skin: Skin is warm and dry. No rash noted. Not diaphoretic. No erythema. No pallor.  Psychiatric: Normal mood and affect. Behavior, judgment, thought content normal.   Labs on Admission:  Basic Metabolic Panel:  Recent Labs Lab 03/10/15 0513  NA 141  K 4.3  CL 107  GLUCOSE 115*  BUN 12  CREATININE 0.80   CBC:  Recent Labs Lab 03/10/15 0508 03/10/15 0513  WBC 3.0*  --   NEUTROABS 2.4  --   HGB 13.3 13.9  HCT 38.5* 41.0  MCV 89.5  --   PLT 247  --    EKG: pending   If 7PM-7AM, please contact night-coverage [email or web address] Password [facility] 03/10/2015, 8:44 AM

## 2015-03-10 NOTE — ED Notes (Signed)
Ambulated the pt from the room to the end of the  hallway and back to room 91%-93% O2 did drop to 89% once

## 2015-03-11 ENCOUNTER — Observation Stay (HOSPITAL_COMMUNITY): Payer: Self-pay

## 2015-03-11 DIAGNOSIS — Z9889 Other specified postprocedural states: Secondary | ICD-10-CM | POA: Insufficient documentation

## 2015-03-11 DIAGNOSIS — J90 Pleural effusion, not elsewhere classified: Secondary | ICD-10-CM | POA: Insufficient documentation

## 2015-03-11 LAB — CBC WITH DIFFERENTIAL/PLATELET
Basophils Absolute: 0 10*3/uL (ref 0.0–0.1)
Basophils Relative: 0 %
Eosinophils Absolute: 0 10*3/uL (ref 0.0–0.7)
Eosinophils Relative: 0 %
HCT: 36.1 % — ABNORMAL LOW (ref 39.0–52.0)
Hemoglobin: 12.6 g/dL — ABNORMAL LOW (ref 13.0–17.0)
Lymphocytes Relative: 16 %
Lymphs Abs: 0.9 10*3/uL (ref 0.7–4.0)
MCH: 30.7 pg (ref 26.0–34.0)
MCHC: 34.9 g/dL (ref 30.0–36.0)
MCV: 87.8 fL (ref 78.0–100.0)
Monocytes Absolute: 0.6 10*3/uL (ref 0.1–1.0)
Monocytes Relative: 11 %
Neutro Abs: 3.9 10*3/uL (ref 1.7–7.7)
Neutrophils Relative %: 73 %
Platelets: 248 10*3/uL (ref 150–400)
RBC: 4.11 MIL/uL — ABNORMAL LOW (ref 4.22–5.81)
RDW: 12.1 % (ref 11.5–15.5)
WBC: 5.4 10*3/uL (ref 4.0–10.5)

## 2015-03-11 LAB — BODY FLUID CELL COUNT WITH DIFFERENTIAL
Eos, Fluid: 0 %
Lymphs, Fluid: 50 %
Monocyte-Macrophage-Serous Fluid: 43 % — ABNORMAL LOW (ref 50–90)
Neutrophil Count, Fluid: 7 % (ref 0–25)
Total Nucleated Cell Count, Fluid: 1098 cu mm — ABNORMAL HIGH (ref 0–1000)

## 2015-03-11 LAB — GLUCOSE, PERITONEAL FLUID: Glucose, Peritoneal Fluid: 95 mg/dL

## 2015-03-11 LAB — LACTATE DEHYDROGENASE, PLEURAL OR PERITONEAL FLUID: LD, Fluid: 92 U/L — ABNORMAL HIGH (ref 3–23)

## 2015-03-11 LAB — BASIC METABOLIC PANEL
Anion gap: 7 (ref 5–15)
BUN: 22 mg/dL — ABNORMAL HIGH (ref 6–20)
CO2: 20 mmol/L — ABNORMAL LOW (ref 22–32)
Calcium: 8.2 mg/dL — ABNORMAL LOW (ref 8.9–10.3)
Chloride: 105 mmol/L (ref 101–111)
Creatinine, Ser: 0.74 mg/dL (ref 0.61–1.24)
GFR calc Af Amer: >60 mL/min (ref >60)
GFR calc non Af Amer: >60 mL/min (ref >60)
Glucose, Bld: 116 mg/dL — ABNORMAL HIGH (ref 65–99)
Potassium: 3.8 mmol/L (ref 3.5–5.1)
Sodium: 132 mmol/L — ABNORMAL LOW (ref 135–145)

## 2015-03-11 LAB — GRAM STAIN

## 2015-03-11 LAB — PROTEIN, BODY FLUID: Total protein, fluid: 4.8 g/dL

## 2015-03-11 LAB — PATHOLOGIST SMEAR REVIEW

## 2015-03-11 MED ORDER — PREDNISONE 5 MG PO TABS: ORAL_TABLET | ORAL | Status: DC

## 2015-03-11 MED ORDER — IBUPROFEN 600 MG PO TABS: 600.0000 mg | ORAL_TABLET | Freq: Four times a day (QID) | ORAL | Status: DC | PRN

## 2015-03-11 MED ORDER — OMEPRAZOLE 20 MG PO CPDR: 20.0000 mg | DELAYED_RELEASE_CAPSULE | Freq: Every day | ORAL | Status: DC

## 2015-03-11 NOTE — Progress Notes (Signed)
Patient discharged home with family, discharge instructions given and explain to patient and he verbalized understanding, denies any pain/distress. No wound noted, skin intact.

## 2015-03-11 NOTE — Discharge Summary (Signed)
Physician Discharge Summary  [name] [id number] DOB: October 31, 1970 DOA: 03/10/2015  PCP: No PCP Per Patient  Admit date: 03/10/2015 Discharge date: 03/11/2015  Recommendations for Outpatient Follow-up:  1. Pt will need to follow up with PCP in 2-3 weeks post discharge 2. Please obtain BMP to evaluate electrolytes and kidney function  Discharge Diagnoses:  Active Problems:   Lupus ([place])   Dyspnea  Discharge Condition: Stable  Diet recommendation: Heart healthy diet discussed in details   HPI:  Pt is 44 yo male with known history of lupus on Chloroquine, presented to [facility] ED with main concern of several days duration of progressively worsening dyspnea that initially started with exertion and has progressed to dyspnea at rest over the past 24 - 48 hours, associated with malaise and poor oral intake. Pt denies fevers, chills, chest pain, no recent similar events.   In ED, pt noted to be hemodynamically stable with VSS, imaging studies notable for left pleural effusion. TRH asked to admit for evaluation and management. Pt started on solumedrol 125 mg IV in ED.   Assessment and Plan: Active Problems:  Acute respiratory distress with hypoxia due to Left sided pleural effusion, ? Related to lupus flare  - pt wants to go home, pleural fluid analysis pending and pt made aware to call me back in 1-2 days to find out results  - keep on prednisone with planned tapering  - pt is s/p thoracentesis, 1.3 L fluid removed    Leukopenia - from lupus  Radiological Exams on Admission: Dg Chest 2 View 03/09/2015 Moderate left pleural effusion.  Ct Angio Chest Pe W/cm &/or Wo Cm 03/10/2015 No evidence of pulmonary embolism. 2. Moderate left and small right pleural effusion with multi segment left lower lobe atelectasis. Effusions were also seen by chest CT 05/10/2004, question relationship to patient's lupus.   Code Status: Full Family Communication: Pt and sister at  bedside Disposition Plan: Home   Discharge Exam: Filed Vitals:   03/11/15 1000  BP: 133/94  Pulse: 74  Temp:   Resp: 20   Filed Vitals:   03/11/15 0429 03/11/15 0908 03/11/15 0920 03/11/15 1000  BP: 126/83 128/78 133/84 133/94  Pulse: 61   74  Temp: 97.7 F (36.5 C)     TempSrc: Oral     Resp: 18   20  Height:      Weight:      SpO2: 98%   95%    General: Pt is alert, follows commands appropriately, not in acute distress Cardiovascular: Regular rate and rhythm, S1/S2 +, no murmurs, no rubs, no gallops Respiratory: Clear to auscultation bilaterally, no wheezing, no crackles, no rhonchi Abdominal: Soft, non tender, non distended, bowel sounds +, no guarding  Discharge Instructions  Discharge Instructions    Diet - low sodium heart healthy    Complete by:  As directed      Increase activity slowly    Complete by:  As directed             Medication List    STOP taking these medications        chloroquine 250 MG tablet  Commonly known as:  ARALEN      TAKE these medications        ibuprofen 600 MG tablet  Commonly known as:  ADVIL,MOTRIN  Take 1 tablet (600 mg total) by mouth every 6 (six) hours as needed for moderate pain.     omeprazole 20 MG capsule  Commonly known as:  PRILOSEC  Take 1 capsule (20 mg total) by mouth daily.     predniSONE 5 MG tablet  Commonly known as:  DELTASONE  Take 40 mg tablet tomorrow 03/12/2015 and taper down by 5 mg daily until completed           Follow-up Information    Schedule an appointment as soon as possible for a visit with Campbell Lerner, MD.   Specialty:  Rheumatology   Contact information:   [address] [place] [address] [phone]       Call Faye Ramsay, MD.   Specialty:  Internal Medicine   Why:  As needed call my cell phone [phone]   Contact information:   [address] [place] [address] [phone]        The results of  significant diagnostics from this hospitalization (including imaging, microbiology, ancillary and laboratory) are listed below for reference.     Microbiology: No results found for this or any previous visit (from the past 240 hour(s)).   Labs: Basic Metabolic Panel:  Recent Labs Lab 03/10/15 0513 03/11/15 0419  NA 141 132*  K 4.3 3.8  CL 107 105  CO2  --  20*  GLUCOSE 115* 116*  BUN 12 22*  CREATININE 0.80 0.74  CALCIUM  --  8.2*   CBC:  Recent Labs Lab 03/10/15 0508 03/10/15 0513 03/11/15 0419  WBC 3.0*  --  5.4  NEUTROABS 2.4  --  3.9  HGB 13.3 13.9 12.6*  HCT 38.5* 41.0 36.1*  MCV 89.5  --  87.8  PLT 247  --  248   SIGNED: Time coordinating discharge: 30 minutes  Faye Ramsay, MD  [facility] 03/11/2015, 10:28 AM Pager [phone]  If 7PM-7AM, please contact night-coverage [email or web address] Password [email or web address]

## 2015-03-11 NOTE — Procedures (Signed)
Successful US guided left thoracentesis. Yielded 1 liter of serous fluid. Pt tolerated procedure well. No immediate complications.  Specimen was sent for labs. CXR ordered.  Tsosie Billing D PA-C 03/11/2015 9:29 AM

## 2015-03-11 NOTE — Discharge Instructions (Signed)
Pleural Effusion °A pleural effusion is an abnormal buildup of fluid in the layers of tissue between your lungs and the inside of your chest (pleural space). These two layers of tissue that line both your lungs and the inside of your chest are called pleura. Usually, there is no air in the space between the pleura, only a thin layer of fluid. If left untreated, a large amount of fluid can build up and cause the lung to collapse. A pleural effusion is usually caused by another disease that requires treatment. °The two main types of pleural effusion are: °· Transudative pleural effusion. This happens when fluid leaks into the pleural space because of a low protein count in your blood or high blood pressure in your vessels. Heart failure often causes this. °· Exudative infusion. This occurs when fluid collects in the pleural space from blocked blood vessels or lymph vessels. Some lung diseases, injuries, and cancers can cause this type of effusion. °CAUSES °Pleural effusion can be caused by: °· Heart failure. °· A blood clot in the lung (pulmonary embolism). °· Pneumonia. °· Cancer. °· Liver failure (cirrhosis). °· Kidney disease. °· Complications from surgery, such as from open heart surgery. °SIGNS AND SYMPTOMS °In some cases, pleural effusion may cause no symptoms. Symptoms can include: °· Shortness of breath, especially when lying down. °· Chest pain, often worse when taking a deep breath. °· Fever. °· Dry cough that is lasting (chronic). °· Hiccups. °· Rapid breathing. °An underlying condition that is causing the pleural effusion (such as heart failure, pneumonia, blood clots, tuberculosis, or cancer) may also cause additional symptoms. °DIAGNOSIS °Your health care provider may suspect pleural effusion based on your symptoms and medical history. Your health care provider will also do a physical exam and a chest X-ray. If the X-ray shows there is fluid in your chest, you may need to have this fluid removed using a  needle (thoracentesis) so it can be tested. °You may also have: °· Imaging studies of the chest, such as: °¨ Ultrasound. °¨ CT scan. °· Blood tests for kidney and liver function. °TREATMENT °Treatment depends on the cause of the pleural effusion. Treatment may include: °· Taking antibiotic medicines to clear up an infection that is causing the pleural effusion. °· Placing a tube in the chest to drain the effusion (tube thoracostomy). This procedure is often used when there is an infection in the fluid. °· Surgery to remove the fibrous outer layer of tissue from the pleural space (decortication). °· Thoracentesis, which can improve cough and shortness of breath. °· A procedure to put medicine into the chest cavity to seal the pleural space to prevent fluid buildup (pleurodesis). °· Chemotherapy and radiation therapy. These may be required in the case of cancerous (malignant) pleural effusion. °HOME CARE INSTRUCTIONS °· Take medicines only as directed by your health care provider. °· Keep track of how long you can gently exercise before you get short of breath. Try simply walking at first. °· Do not use any tobacco products, including cigarettes, chewing tobacco, or electronic cigarettes. If you need help quitting, ask your health care provider. °· Keep all follow-up visits as directed by your health care provider. This is important. °SEEK MEDICAL CARE IF: °· The amount of time that you are able to exercise decreases or does not improve with time. °· You have pain or signs of infection at the puncture site if you had thoracentesis. Watch for: °¨ Drainage. °¨ Redness. °¨ Swelling. °· You have a fever. °  SEEK IMMEDIATE MEDICAL CARE IF: °· You are short of breath. °· You develop chest pain. °· You develop a new cough. °MAKE SURE YOU: °· Understand these instructions. °· Will watch your condition. °· Will get help right away if you are not doing well or get worse. °  °This information is not intended to replace advice  given to you by your health care provider. Make sure you discuss any questions you have with your health care provider. °  °Document Released: 04/13/2005 Document Revised: 05/04/2014 Document Reviewed: 09/06/2013 °Elsevier Interactive Patient Education ©2016 [employer] ° °

## 2015-03-12 LAB — PH, BODY FLUID: pH, Body Fluid: 7.9

## 2015-03-14 ENCOUNTER — Telehealth: Payer: Self-pay

## 2015-03-16 LAB — CULTURE, BODY FLUID W GRAM STAIN -BOTTLE

## 2015-03-16 LAB — CULTURE, BODY FLUID-BOTTLE: Culture: NO GROWTH

## 2015-04-26 ENCOUNTER — Emergency Department (HOSPITAL_COMMUNITY): Payer: Self-pay

## 2015-04-26 ENCOUNTER — Emergency Department (HOSPITAL_COMMUNITY)
Admission: EM | Admit: 2015-04-26 | Discharge: 2015-04-26 | Disposition: A | Discharge status: Home / Self Care | Payer: Self-pay | Attending: Emergency Medicine | Admitting: Emergency Medicine

## 2015-04-26 ENCOUNTER — Encounter (HOSPITAL_COMMUNITY): Payer: Self-pay | Admitting: Emergency Medicine

## 2015-04-26 DIAGNOSIS — M2550 Pain in unspecified joint: Secondary | ICD-10-CM

## 2015-04-26 DIAGNOSIS — M329 Systemic lupus erythematosus, unspecified: Secondary | ICD-10-CM | POA: Insufficient documentation

## 2015-04-26 DIAGNOSIS — R091 Pleurisy: Secondary | ICD-10-CM | POA: Insufficient documentation

## 2015-04-26 DIAGNOSIS — R0602 Shortness of breath: Secondary | ICD-10-CM | POA: Insufficient documentation

## 2015-04-26 LAB — BASIC METABOLIC PANEL
Anion gap: 6 (ref 5–15)
BUN: 14 mg/dL (ref 6–20)
CO2: 28 mmol/L (ref 22–32)
Calcium: 8.4 mg/dL — ABNORMAL LOW (ref 8.9–10.3)
Chloride: 104 mmol/L (ref 101–111)
Creatinine, Ser: 0.95 mg/dL (ref 0.61–1.24)
GFR calc Af Amer: >60 mL/min (ref >60)
GFR calc non Af Amer: >60 mL/min (ref >60)
Glucose, Bld: 157 mg/dL — ABNORMAL HIGH (ref 65–99)
Potassium: 3.9 mmol/L (ref 3.5–5.1)
Sodium: 138 mmol/L (ref 135–145)

## 2015-04-26 LAB — I-STAT TROPONIN, ED: Troponin i, poc: 0 ng/mL (ref 0.00–0.08)

## 2015-04-26 LAB — DIFFERENTIAL
Basophils Absolute: 0 10*3/uL (ref 0.0–0.1)
Basophils Relative: 0 %
Eosinophils Absolute: 0 10*3/uL (ref 0.0–0.7)
Eosinophils Relative: 1 %
Lymphocytes Relative: 22 %
Lymphs Abs: 0.5 10*3/uL — ABNORMAL LOW (ref 0.7–4.0)
Monocytes Absolute: 0.2 10*3/uL (ref 0.1–1.0)
Monocytes Relative: 10 %
Neutro Abs: 1.5 10*3/uL — ABNORMAL LOW (ref 1.7–7.7)
Neutrophils Relative %: 67 %

## 2015-04-26 LAB — CBC
HCT: 40.2 % (ref 39.0–52.0)
Hemoglobin: 13.6 g/dL (ref 13.0–17.0)
MCH: 30.1 pg (ref 26.0–34.0)
MCHC: 33.8 g/dL (ref 30.0–36.0)
MCV: 88.9 fL (ref 78.0–100.0)
Platelets: 260 10*3/uL (ref 150–400)
RBC: 4.52 MIL/uL (ref 4.22–5.81)
RDW: 13 % (ref 11.5–15.5)
WBC: 2.7 10*3/uL — ABNORMAL LOW (ref 4.0–10.5)

## 2015-04-26 LAB — SEDIMENTATION RATE: Sed Rate: 30 mm/hr — ABNORMAL HIGH (ref 0–16)

## 2015-04-26 LAB — C-REACTIVE PROTEIN: CRP: 0.7 mg/dL (ref <1.0)

## 2015-04-26 MED ORDER — HYDROCODONE-ACETAMINOPHEN 5-325 MG PO TABS: 1.0000 | ORAL_TABLET | Freq: Four times a day (QID) | ORAL | Status: DC | PRN

## 2015-04-26 MED ORDER — IBUPROFEN 600 MG PO TABS: 600.0000 mg | ORAL_TABLET | Freq: Four times a day (QID) | ORAL | Status: DC | PRN

## 2015-04-26 MED ORDER — PREDNISONE 20 MG PO TABS
60.0000 mg | ORAL_TABLET | Freq: Once | ORAL | Status: AC
  Administered 2015-04-26: 60 mg via ORAL
  Filled 2015-04-26: qty 3

## 2015-04-26 MED ORDER — PREDNISONE 10 MG (21) PO TBPK
10.0000 mg | ORAL_TABLET | Freq: Every day | ORAL | Status: DC
Start: 2015-04-26 — End: 2015-06-14

## 2015-04-26 MED ORDER — HYDROCODONE-ACETAMINOPHEN 5-325 MG PO TABS
1.0000 | ORAL_TABLET | Freq: Once | ORAL | Status: AC
  Administered 2015-04-26: 1 via ORAL
  Filled 2015-04-26: qty 1

## 2015-04-26 NOTE — ED Notes (Signed)
Patient presents for pleurisy, joint swelling x1 week. History of lupus, pericarditis. Rates pain 2/10. Denies N/V, lightheadedness, dizziness.

## 2015-04-26 NOTE — ED Provider Notes (Signed)
CSN: [id number]     Arrival date & time 04/26/15  1347 History   First MD Initiated Contact with Patient 04/26/15 1535     Chief Complaint  Patient presents with  . Pleurisy  . Joint Swelling  . Lupus     (Consider location/radiation/quality/duration/timing/severity/associated sxs/prior Treatment) HPI 44 year old male with history of SLE who presents with joint pain and pleurisy. Recently admitted in 02/2015 for lupus flare up c/b left pleural effusion s/p 1.3 L thoracentesis. States his symptoms improved with steroids. Over past week, developed increased polyarthralgia (worst in morning, gradually improving throughout the days), involving his hands, feet, knees, shoulders, and elbows. During this time also with chest pain, worse with deep inspiration and mild sob. States this is similar to pleurisy that he has in past. No prior history of PE/DVT, leg swelling or pain, or recent immobolization. No cough, fever, chills, n/v/d, abd pain. States just established care with rheumatology and has yet to follow-up. Currently no maintenance medication and states he ran out of steroids.   Past Medical History  Diagnosis Date  . Lupus ([place])    History reviewed. No pertinent past surgical history. History reviewed. No pertinent family history. Social History  Substance Use Topics  . Smoking status: Never Smoker   . Smokeless tobacco: None  . Alcohol Use: Yes    Review of Systems 10/14 systems reviewed and are negative other than those stated in the HPI   Allergies  Review of patient's allergies indicates no known allergies.  Home Medications   Prior to Admission medications   Medication Sig Start Date End Date Taking? Authorizing Provider  ibuprofen (ADVIL,MOTRIN) 600 MG tablet Take 1 tablet (600 mg total) by mouth every 6 (six) hours as needed for moderate pain. 03/11/15  Yes Theodis Blaze, MD  HYDROcodone-acetaminophen (NORCO/VICODIN) 5-325 MG tablet Take 1 tablet by mouth every 6  (six) hours as needed for moderate pain or severe pain. 04/26/15   Forde Dandy, MD  ibuprofen (ADVIL,MOTRIN) 600 MG tablet Take 1 tablet (600 mg total) by mouth every 6 (six) hours as needed. 04/26/15   Forde Dandy, MD  omeprazole (PRILOSEC) 20 MG capsule Take 1 capsule (20 mg total) by mouth daily. Patient not taking: Reported on 04/26/2015 03/11/15   Theodis Blaze, MD  predniSONE (DELTASONE) 5 MG tablet Take 40 mg tablet tomorrow 03/12/2015 and taper down by 5 mg daily until completed Patient not taking: Reported on 04/26/2015 03/11/15   Theodis Blaze, MD  predniSONE (STERAPRED UNI-PAK 21 TAB) 10 MG (21) TBPK tablet Take 1 tablet (10 mg total) by mouth daily. Take 6 tabs by mouth daily  for 3 days, then 5 tabs for 2 days, then 4 tabs for 2 days, then 3 tabs for 2 days, 2 tabs for 2 days, then 1 tab by mouth daily for 2 days 04/26/15   Forde Dandy, MD   BP 159/99 mmHg  Pulse 88  Temp(Src) 97.9 F (36.6 C) (Oral)  Resp 23  SpO2 99% Physical Exam Physical Exam  Nursing note and vitals reviewed. Constitutional: Well developed, well nourished, non-toxic, and in no acute distress Head: Normocephalic and atraumatic.  Mouth/Throat: Oropharynx is clear and moist.  Neck: Normal range of motion. Neck supple.  Cardiovascular: Normal rate and regular rhythm.   Pulmonary/Chest: Effort normal and breath sounds normal.  Abdominal: Soft. There is no tenderness. There is no rebound and no guarding.  Musculoskeletal: Normal range of motion.  Neurological: Alert, no  facial droop, fluent speech, moves all extremities symmetrically Skin: Skin is warm and dry.  Psychiatric: Cooperative  ED Course  Procedures (including critical care time) Labs Review Labs Reviewed  BASIC METABOLIC PANEL - Abnormal; Notable for the following:    Glucose, Bld 157 (*)    Calcium 8.4 (*)    All other components within normal limits  CBC - Abnormal; Notable for the following:    WBC 2.7 (*)    All other components  within normal limits  SEDIMENTATION RATE - Abnormal; Notable for the following:    Sed Rate 30 (*)    All other components within normal limits  DIFFERENTIAL - Abnormal; Notable for the following:    Neutro Abs 1.5 (*)    Lymphs Abs 0.5 (*)    All other components within normal limits  C-REACTIVE PROTEIN  I-STAT TROPOININ, ED    Imaging Review Dg Chest 2 View  04/26/2015  CLINICAL DATA:  Chest pain for 2-3 days, worsening with deep breath. History of lupus. EXAM: CHEST  2 VIEW COMPARISON:  Chest x-ray dated 03/11/2015. Comparison also made to a chest CT dated 03/10/2015. FINDINGS: There is a left pleural effusion, small to moderate in size, better seen on the previous chest CT. Lungs are otherwise clear. No pneumothorax. Cardiomediastinal silhouette is stable in size and configuration. Osseous structures about the chest are unremarkable. IMPRESSION: 1. Left pleural effusion, small to moderate in size, probably decreased in size compared to the amount seen on chest CT of 03/10/2015. On the earlier chest CT, there was also associated compressive atelectasis at the left lung base. 2. No new lung findings. Electronically Signed   By: Franki Cabot M.D.   On: 04/26/2015 14:41   I have personally reviewed and evaluated these images and lab results as part of my medical decision-making.   EKG Interpretation   Date/Time:  Friday April 26 2015 13:55:21 EST Ventricular Rate:  84 PR Interval:  152 QRS Duration: 86 QT Interval:  367 QTC Calculation: 434 R Axis:   19 Text Interpretation:  Sinus rhythm Baseline wander in lead(s) V4 No  significant change since last tracing Confirmed by LIU MD, DANA [id number]) on  04/26/2015 3:45:59 PM      MDM   Final diagnoses:  SLE exacerbation ([place])  Polyarthralgia  Pleurisy     44 year old male with history of lupus who presents with polyarthralgias and pleurisy. Eyes well-appearing and in no acute distress. Vital signs within normal limits.  Cardiopulmonary exam is unremarkable. He has some diffuse joint pains, but no area that would be concerning for septic arthritis. Basic blood work with mild leukopenia but no neutropenia, and is otherwise unremarkable. Has elevated and her inflammatory markers, and presentation overall is consistent with likely a lupus flare. His chest x-ray shows a slightly improved pleural effusion compared to prior admission. Just recently received CTA chest for evaluation of PE one month ago, I feel overall this is unlikely that he has developed additional PE in this setting. I discussed with the patient that he has increased risk for thromboembolic disease given his underlying lupus, and without tachycardia, dyspnea, hypoxia, feels more consistent with pleurisy and less likely PE. Patient understands his risks but agrees that deferring CT PE today best option and to return if progressive or worsening symptoms for re-evaluation. Requested outpatient treatment with steroids, and will discharge with steroid burst and tape. Will set up follow-up with rheumatology in next 3-4 days. Strict return and follow-up instructions reviewed. He expressed  understanding of all discharge instructions and felt comfortable with the plan of care.   Forde Dandy, MD 04/26/15 [id number]

## 2015-04-26 NOTE — Discharge Instructions (Signed)
Take motrin/ibuprofen for pain control. Norco for break through pain. Return without fail for worsening symptoms, including worsening pain, difficulty breathing, or any other symptoms concerning to you.  Joint Pain Joint pain, which is also called arthralgia, can be caused by many things. Joint pain often goes away when you follow your health care provider's instructions for relieving pain at home. However, joint pain can also be caused by conditions that require further treatment. Common causes of joint pain include:  Bruising in the area of the joint.  Overuse of the joint.  Wear and tear on the joints that occur with aging (osteoarthritis).  Various other forms of arthritis.  A buildup of a crystal form of uric acid in the joint (gout).  Infections of the joint (septic arthritis) or of the bone (osteomyelitis). Your health care provider may recommend medicine to help with the pain. If your joint pain continues, additional tests may be needed to diagnose your condition. HOME CARE INSTRUCTIONS Watch your condition for any changes. Follow these instructions as directed to lessen the pain that you are feeling.  Take medicines only as directed by your health care provider.  Rest the affected area for as long as your health care provider says that you should. If directed to do so, raise the painful joint above the level of your heart while you are sitting or lying down.  Do not do things that cause or worsen pain.  If directed, apply ice to the painful area:  Put ice in a plastic bag.  Place a towel between your skin and the bag.  Leave the ice on for 20 minutes, 2-3 times per day.  Wear an elastic bandage, splint, or sling as directed by your health care provider. Loosen the elastic bandage or splint if your fingers or toes become numb and tingle, or if they turn cold and blue.  Begin exercising or stretching the affected area as directed by your health care provider. Ask your  health care provider what types of exercise are safe for you.  Keep all follow-up visits as directed by your health care provider. This is important. SEEK MEDICAL CARE IF:  Your pain increases, and medicine does not help.  Your joint pain does not improve within 3 days.  You have increased bruising or swelling.  You have a fever.  You lose 10 lb (4.5 kg) or more without trying. SEEK IMMEDIATE MEDICAL CARE IF:  You are not able to move the joint.  Your fingers or toes become numb or they turn cold and blue.   This information is not intended to replace advice given to you by your health care provider. Make sure you discuss any questions you have with your health care provider.   Document Released: 04/13/2005 Document Revised: 05/04/2014 Document Reviewed: 01/23/2014 Elsevier Interactive Patient Education 2016 Else[employer] Inc.  Systemic Lupus Erythematosus, Adult Systemic lupus erythematosus is a long-term (chronic) disease that can affect many parts of the body. It can damage the skin, joints, blood vessels, brain, kidneys, lungs, heart, and other internal organs. It causes pain, irritation, and inflammation. Systemic lupus erythematosus is an autoimmune disease. With this type of disease, the body's defense system (immune system) mistakenly attacks normal tissues instead of attacking germs or abnormal growths. CAUSES The cause of this condition is not known. RISK FACTORS This condition is more likely to develop in:  Females.  People of Asian descent.  People of African-American descent.  People who have a family history of the condition.  SYMPTOMS General symptoms include:  Joint pain and swelling (common).  Fever.  Fatigue.  Unusual weight loss or weight gain.  Skin rashes, especially over the nose and cheeks (butterfly rash) and after sun exposure.  Sores inside the mouth or nose. Other symptoms depend on which parts of the body are affected. They can  include:  Shortness of breath.  Chest pain.  Frequent urination.  Blood in the urine.  Seizures.  Mental changes.  Hair loss.  Swollen and tender lymph nodes.  Swelling of the hands or feet. Symptoms can come and go. A period of time when symptoms get worse or come back is called a flare. A period of time with no symptoms is called a remission. DIAGNOSIS This condition is diagnosed based on symptoms, a medical history, and a physical exam. You may also have tests, including:  Blood tests.  Urine tests.  A chest X-ray.  A skin or kidney biopsy. For this test, a sample of tissue is taken from the skin or kidney and studied under a microscope. You may be referred to an autoimmune disease specialist (rheumatologist). TREATMENT There is no cure for this condition, but treatment can keep the disease in remission, help to control symptoms, and prevent damage to the heart, lungs, kidneys, and other organs. Treatment may involve taking a combination of medicines over time. HOME CARE INSTRUCTIONS Medicines  Take medicines only as directed by your health care provider.  Do not take any medicines that contain estrogen without first checking with your health care provider. Estrogen can trigger flares and may increase your risk for blood clots. Lifestyle  Eat a heart-healthy diet.  Stay active as directed by your health care provider.  Do not smoke. If you need help quitting, ask your health care provider.  Protect your skin from the sun by applying sunblock and wearing protective hats and clothing.  Learn as much as you can about your condition and have a good support system in place. Support may come from family, friends, or a lupus support group. General Instructions  Keep all follow-up visits as directed by your health care provider. This is important.  Work closely with all of your health care providers to manage your condition.  Let your health care provider know right  away if you become pregnant or if you plan to become pregnant. Pregnancy in women with this condition is considered high risk. SEEK MEDICAL CARE IF:  You have a fever.  Your symptoms flare.  You develop new symptoms.  You develop swollen feet or hands.  You develop puffiness around your eyes.  Your medicines are not working.  You have bloody, foamy, or coffee-colored urine.  There are changes in your urination. For example, you urinate more often at night.  You think that you may be depressed or have anxiety. SEEK IMMEDIATE MEDICAL CARE IF:  You have chest pain.  You have trouble breathing.  You have a seizure.  You suddenly get a very bad headache.  You suddenly develop facial or body weakness.  You cannot speak.  You cannot understand speech.   This information is not intended to replace advice given to you by your health care provider. Make sure you discuss any questions you have with your health care provider.   Document Released: 04/03/2002 Document Revised: 08/28/2014 Document Reviewed: 03/21/2014 Elsevier Interactive Patient Education 2016 [place] is redness, puffiness (swelling), and soreness (inflammation) of the lining of the lungs. It can be hard to  breathe and hurt to breathe. Coughing or deep breathing will make it hurt more. It is often caused by an existing infection or disease.  HOME CARE  Only take medicine as told by your doctor.  Only take antibiotic medicine as directed. Make sure to finish it even if you start to feel better. GET HELP RIGHT AWAY IF:   Your lips, fingernails, or toenails are blue or dark.  You cough up blood.  You have a hard time breathing.  Your pain is not controlled with medicine or it lasts for more than 1 week.  Your pain spreads (radiates) into your neck, arms, or jaw.  You are short of breath or wheezing.  You develop a fever, rash, throw up (vomit), or faint. MAKE SURE YOU:    Understand these instructions.  Will watch your condition.  Will get help right away if you are not doing well or get worse.   This information is not intended to replace advice given to you by your health care provider. Make sure you discuss any questions you have with your health care provider.   Document Released: 03/26/2008 Document Revised: 12/14/2012 Document Reviewed: 09/25/2012 Elsevier Interactive Patient Education [employer].

## 2015-04-26 NOTE — ED Notes (Signed)
Patient ambulated well with pulse oximetry. O2 sat stayed at 100

## 2015-04-26 NOTE — ED Notes (Signed)
Explained to patient in delay from being discharged.  MD is waiting for test results

## 2015-06-11 ENCOUNTER — Encounter (HOSPITAL_COMMUNITY): Payer: Self-pay | Admitting: Emergency Medicine

## 2015-06-11 ENCOUNTER — Inpatient Hospital Stay (HOSPITAL_COMMUNITY): Payer: Self-pay

## 2015-06-11 ENCOUNTER — Inpatient Hospital Stay (HOSPITAL_COMMUNITY)
Admission: EM | Admit: 2015-06-11 | Discharge: 2015-06-14 | DRG: 188 | Disposition: A | Discharge status: Home / Self Care | Payer: Self-pay | Attending: Internal Medicine | Admitting: Internal Medicine

## 2015-06-11 ENCOUNTER — Emergency Department (HOSPITAL_COMMUNITY): Payer: Self-pay

## 2015-06-11 DIAGNOSIS — M2550 Pain in unspecified joint: Secondary | ICD-10-CM | POA: Diagnosis present

## 2015-06-11 DIAGNOSIS — Z9889 Other specified postprocedural states: Secondary | ICD-10-CM

## 2015-06-11 DIAGNOSIS — R0600 Dyspnea, unspecified: Secondary | ICD-10-CM | POA: Diagnosis present

## 2015-06-11 DIAGNOSIS — M329 Systemic lupus erythematosus, unspecified: Secondary | ICD-10-CM | POA: Diagnosis present

## 2015-06-11 DIAGNOSIS — J90 Pleural effusion, not elsewhere classified: Principal | ICD-10-CM | POA: Diagnosis present

## 2015-06-11 DIAGNOSIS — Z79899 Other long term (current) drug therapy: Secondary | ICD-10-CM

## 2015-06-11 DIAGNOSIS — M3213 Lung involvement in systemic lupus erythematosus: Secondary | ICD-10-CM | POA: Diagnosis present

## 2015-06-11 LAB — GRAM STAIN

## 2015-06-11 LAB — CBC WITH DIFFERENTIAL/PLATELET
Basophils Absolute: 0 10*3/uL (ref 0.0–0.1)
Basophils Relative: 0 %
Eosinophils Absolute: 0.1 10*3/uL (ref 0.0–0.7)
Eosinophils Relative: 2 %
HCT: 36.4 % — ABNORMAL LOW (ref 39.0–52.0)
Hemoglobin: 12.3 g/dL — ABNORMAL LOW (ref 13.0–17.0)
Lymphocytes Relative: 10 %
Lymphs Abs: 0.4 10*3/uL — ABNORMAL LOW (ref 0.7–4.0)
MCH: 29.7 pg (ref 26.0–34.0)
MCHC: 33.8 g/dL (ref 30.0–36.0)
MCV: 87.9 fL (ref 78.0–100.0)
Monocytes Absolute: 0.1 10*3/uL (ref 0.1–1.0)
Monocytes Relative: 3 %
Neutro Abs: 3.1 10*3/uL (ref 1.7–7.7)
Neutrophils Relative %: 85 %
Platelets: 259 10*3/uL (ref 150–400)
RBC: 4.14 MIL/uL — ABNORMAL LOW (ref 4.22–5.81)
RDW: 12.8 % (ref 11.5–15.5)
WBC: 3.7 10*3/uL — ABNORMAL LOW (ref 4.0–10.5)

## 2015-06-11 LAB — LACTATE DEHYDROGENASE, PLEURAL OR PERITONEAL FLUID: LD, Fluid: 548 U/L — ABNORMAL HIGH (ref 3–23)

## 2015-06-11 LAB — BASIC METABOLIC PANEL
Anion gap: 8 (ref 5–15)
BUN: 15 mg/dL (ref 6–20)
CO2: 25 mmol/L (ref 22–32)
Calcium: 8.3 mg/dL — ABNORMAL LOW (ref 8.9–10.3)
Chloride: 103 mmol/L (ref 101–111)
Creatinine, Ser: 0.81 mg/dL (ref 0.61–1.24)
GFR calc Af Amer: >60 mL/min (ref >60)
GFR calc non Af Amer: >60 mL/min (ref >60)
Glucose, Bld: 105 mg/dL — ABNORMAL HIGH (ref 65–99)
Potassium: 3.8 mmol/L (ref 3.5–5.1)
Sodium: 136 mmol/L (ref 135–145)

## 2015-06-11 LAB — BODY FLUID CELL COUNT WITH DIFFERENTIAL
Eos, Fluid: 0 %
Lymphs, Fluid: 9 %
Monocyte-Macrophage-Serous Fluid: 15 % — ABNORMAL LOW (ref 50–90)
Neutrophil Count, Fluid: 76 % — ABNORMAL HIGH (ref 0–25)
Total Nucleated Cell Count, Fluid: 6444 cu mm — ABNORMAL HIGH (ref 0–1000)

## 2015-06-11 LAB — PROTEIN, BODY FLUID: Total protein, fluid: 4.6 g/dL

## 2015-06-11 LAB — TROPONIN I: Troponin I: <0.03 ng/mL (ref <0.031)

## 2015-06-11 LAB — GLUCOSE, SEROUS FLUID: Glucose, Fluid: <20 mg/dL

## 2015-06-11 LAB — SEDIMENTATION RATE: Sed Rate: 42 mm/hr — ABNORMAL HIGH (ref 0–16)

## 2015-06-11 MED ORDER — ONDANSETRON HCL 4 MG PO TABS: 4.0000 mg | ORAL_TABLET | Freq: Four times a day (QID) | ORAL | Status: DC | PRN

## 2015-06-11 MED ORDER — OXYCODONE HCL 5 MG PO TABS
5.0000 mg | ORAL_TABLET | ORAL | Status: DC | PRN
  Administered 2015-06-11 – 2015-06-14 (×7): 5 mg via ORAL
  Filled 2015-06-11 (×7): qty 1

## 2015-06-11 MED ORDER — IBUPROFEN 200 MG PO TABS
600.0000 mg | ORAL_TABLET | Freq: Four times a day (QID) | ORAL | Status: DC | PRN
Start: 2015-06-11 — End: 2015-06-12

## 2015-06-11 MED ORDER — MORPHINE SULFATE (PF) 4 MG/ML IV SOLN
4.0000 mg | INTRAVENOUS | Status: DC | PRN
  Administered 2015-06-11: 4 mg via INTRAVENOUS
  Filled 2015-06-11: qty 1

## 2015-06-11 MED ORDER — ONDANSETRON HCL 4 MG/2ML IJ SOLN: 4.0000 mg | Freq: Four times a day (QID) | INTRAMUSCULAR | Status: DC | PRN

## 2015-06-11 MED ORDER — PREDNISONE 20 MG PO TABS
60.0000 mg | ORAL_TABLET | Freq: Two times a day (BID) | ORAL | Status: DC
  Administered 2015-06-12: 60 mg via ORAL
  Filled 2015-06-11: qty 3

## 2015-06-11 MED ORDER — ONDANSETRON HCL 4 MG/2ML IJ SOLN
4.0000 mg | Freq: Once | INTRAMUSCULAR | Status: AC
  Administered 2015-06-11: 4 mg via INTRAVENOUS
  Filled 2015-06-11 (×2): qty 2

## 2015-06-11 MED ORDER — METHYLPREDNISOLONE SODIUM SUCC 125 MG IJ SOLR
125.0000 mg | Freq: Once | INTRAMUSCULAR | Status: AC
  Administered 2015-06-11: 125 mg via INTRAVENOUS
  Filled 2015-06-11: qty 2

## 2015-06-11 MED ORDER — PANTOPRAZOLE SODIUM 40 MG PO TBEC
40.0000 mg | DELAYED_RELEASE_TABLET | Freq: Every day | ORAL | Status: DC
Start: 2015-06-11 — End: 2015-06-14
  Administered 2015-06-11 – 2015-06-14 (×4): 40 mg via ORAL
  Filled 2015-06-11 (×4): qty 1

## 2015-06-11 MED ORDER — HYDROXYCHLOROQUINE SULFATE 200 MG PO TABS
400.0000 mg | ORAL_TABLET | Freq: Every day | ORAL | Status: DC
  Administered 2015-06-11 – 2015-06-14 (×4): 400 mg via ORAL
  Filled 2015-06-11 (×4): qty 2

## 2015-06-11 MED ORDER — MORPHINE SULFATE (PF) 2 MG/ML IV SOLN
2.0000 mg | INTRAVENOUS | Status: DC | PRN
  Administered 2015-06-11 – 2015-06-12 (×2): 2 mg via INTRAVENOUS
  Filled 2015-06-11 (×2): qty 1

## 2015-06-11 MED ORDER — SODIUM CHLORIDE 0.9 % IV SOLN
INTRAVENOUS | Status: AC
  Administered 2015-06-11: 75 mL/h via INTRAVENOUS

## 2015-06-11 MED ORDER — ACETAMINOPHEN 650 MG RE SUPP
650.0000 mg | Freq: Four times a day (QID) | RECTAL | Status: DC | PRN
Start: 2015-06-11 — End: 2015-06-14

## 2015-06-11 MED ORDER — ALBUTEROL SULFATE (2.5 MG/3ML) 0.083% IN NEBU: 2.5000 mg | INHALATION_SOLUTION | RESPIRATORY_TRACT | Status: DC | PRN

## 2015-06-11 MED ORDER — DOCUSATE SODIUM 100 MG PO CAPS
100.0000 mg | ORAL_CAPSULE | Freq: Two times a day (BID) | ORAL | Status: DC
  Administered 2015-06-11 – 2015-06-13 (×4): 100 mg via ORAL
  Filled 2015-06-11 (×6): qty 1

## 2015-06-11 MED ORDER — IOHEXOL 350 MG/ML SOLN
100.0000 mL | Freq: Once | INTRAVENOUS | Status: AC | PRN
  Administered 2015-06-11: 100 mL via INTRAVENOUS

## 2015-06-11 MED ORDER — ACETAMINOPHEN 325 MG PO TABS
650.0000 mg | ORAL_TABLET | Freq: Four times a day (QID) | ORAL | Status: DC | PRN
Start: 2015-06-11 — End: 2015-06-14

## 2015-06-11 NOTE — ED Provider Notes (Signed)
CSN: [id number]     Arrival date & time 06/11/15  0705 History   First MD Initiated Contact with Patient 06/11/15 [phone]     Chief Complaint  Patient presents with  . Chest Pain  . Shortness of Breath      HPI  Pt presents for evaluation of chest pain and joint pain. Patient states that he was diagnosed with lupus 10 years ago when being evaluated and undergoing workup for episode of pericarditis. He has been followed with her hematology intermittently since that time. Had a flareup in November, in December. Had left pleural effusion that required thoracentesis. Talk to his rheumatologist about 3 weeks ago for increasing symptoms of shortness of breath chest pain and joint pain.Back on prednisone 20 mg. Was on this for 1 week and taper down to 15, approximately 3-4 days ago taper down to 10 mg per day which she is on currently. His symptoms improved on the 20 mg. Over the last week have recurred. He states that this is the worst pain that he has had with his lupus.  He follows with Dr. Ardeen Jourdain, at [facility] rheumatology.  His medications for his lupus include plaque window which he is currently taking. In the prednisone taper as above, down to 10 mg per day now     Past Medical History  Diagnosis Date  . Lupus ([place])    History reviewed. No pertinent past surgical history. History reviewed. No pertinent family history. Social History  Substance Use Topics  . Smoking status: Never Smoker   . Smokeless tobacco: None  . Alcohol Use: Yes    Review of Systems  Constitutional: Negative for fever, chills, diaphoresis, appetite change and fatigue.  HENT: Negative for mouth sores, sore throat and trouble swallowing.   Eyes: Negative for visual disturbance.  Respiratory: Positive for shortness of breath. Negative for cough, chest tightness and wheezing.   Cardiovascular: Positive for chest pain.  Gastrointestinal: Negative for nausea, vomiting, abdominal pain, diarrhea and abdominal  distention.  Endocrine: Negative for polydipsia, polyphagia and polyuria.  Genitourinary: Negative for dysuria, frequency and hematuria.  Musculoskeletal: Positive for joint swelling and arthralgias. Negative for gait problem.  Skin: Negative for color change, pallor and rash.  Neurological: Negative for dizziness, syncope, light-headedness and headaches.  Hematological: Does not bruise/bleed easily.  Psychiatric/Behavioral: Negative for behavioral problems and confusion.      Allergies  Review of patient's allergies indicates no known allergies.  Home Medications   Prior to Admission medications   Medication Sig Start Date End Date Taking? Authorizing Provider  hydroxychloroquine (PLAQUENIL) 200 MG tablet Take 400 mg by mouth daily.   Yes Historical Provider, MD  ibuprofen (ADVIL,MOTRIN) 200 MG tablet Take 600 mg by mouth every 6 (six) hours as needed for headache or moderate pain.   Yes Historical Provider, MD  omeprazole (PRILOSEC) 20 MG capsule Take 1 capsule (20 mg total) by mouth daily. Patient taking differently: Take 20 mg by mouth daily as needed (acid reflux).  03/11/15  Yes Theodis Blaze, MD  predniSONE (DELTASONE) 5 MG tablet Take 40 mg tablet tomorrow 03/12/2015 and taper down by 5 mg daily until completed 03/11/15  Yes Theodis Blaze, MD  HYDROcodone-acetaminophen (NORCO/VICODIN) 5-325 MG tablet Take 1 tablet by mouth every 6 (six) hours as needed for moderate pain or severe pain. 04/26/15   Forde Dandy, MD  ibuprofen (ADVIL,MOTRIN) 600 MG tablet Take 1 tablet (600 mg total) by mouth every 6 (six) hours as needed for  moderate pain. 03/11/15   Theodis Blaze, MD  ibuprofen (ADVIL,MOTRIN) 600 MG tablet Take 1 tablet (600 mg total) by mouth every 6 (six) hours as needed. 04/26/15   Forde Dandy, MD  predniSONE (STERAPRED UNI-PAK 21 TAB) 10 MG (21) TBPK tablet Take 1 tablet (10 mg total) by mouth daily. Take 6 tabs by mouth daily  for 3 days, then 5 tabs for 2 days, then 4 tabs  for 2 days, then 3 tabs for 2 days, 2 tabs for 2 days, then 1 tab by mouth daily for 2 days 04/26/15   Forde Dandy, MD   BP 133/93 mmHg  Pulse 92  Temp(Src) 98.3 F (36.8 C) (Oral)  Resp 23  SpO2 95% Physical Exam  Constitutional: He is oriented to person, place, and time. He appears well-developed and well-nourished. No distress.  HENT:  Head: Normocephalic.  Eyes: Conjunctivae are normal. Pupils are equal, round, and reactive to light. No scleral icterus.  Neck: Normal range of motion. Neck supple. No thyromegaly present.  Cardiovascular: Normal rate and regular rhythm.  Exam reveals no gallop and no friction rub.   No murmur heard. No muffled heart tones. No JVD. No pericardial friction rub.  Pulmonary/Chest: Effort normal. No respiratory distress. He has decreased breath sounds in the left middle field and the left lower field. He has no wheezes. He has no rales.    Abdominal: Soft. Bowel sounds are normal. He exhibits no distension. There is no tenderness. There is no rebound.  Musculoskeletal: Normal range of motion.  Stiffness and appearance of swelling of the hands and wrists. No diffuse or generalized edema. Discomfort with movement of the ankles and knees. No obvious effusions.  Neurological: He is alert and oriented to person, place, and time.  Skin: Skin is warm and dry. No rash noted.  Psychiatric: He has a normal mood and affect. His behavior is normal.    ED Course  Procedures (including critical care time) Labs Review Labs Reviewed  CBC WITH DIFFERENTIAL/PLATELET - Abnormal; Notable for the following:    WBC 3.7 (*)    RBC 4.14 (*)    Hemoglobin 12.3 (*)    HCT 36.4 (*)    Lymphs Abs 0.4 (*)    All other components within normal limits  BASIC METABOLIC PANEL - Abnormal; Notable for the following:    Glucose, Bld 105 (*)    Calcium 8.3 (*)    All other components within normal limits  SEDIMENTATION RATE - Abnormal; Notable for the following:    Sed Rate 42  (*)    All other components within normal limits  CULTURE, BLOOD (ROUTINE X 2)  CULTURE, BLOOD (ROUTINE X 2)  TROPONIN I  COMPLEMENT, TOTAL  C3 COMPLEMENT  C4 COMPLEMENT  ANA, BODY FLUID  CREATININE, URINE, RANDOM  PROTEIN / CREATININE RATIO, URINE    Imaging Review Ct Angio Chest Pe W/cm &/or Wo Cm  06/11/2015  CLINICAL DATA:  Mid chest pain and shortness of Breath for 3 days, history of lupus EXAM: CT ANGIOGRAPHY CHEST WITH CONTRAST TECHNIQUE: Multidetector CT imaging of the chest was performed using the standard protocol during bolus administration of intravenous contrast. Multiplanar CT image reconstructions and MIPs were obtained to evaluate the vascular anatomy. CONTRAST:  182mL OMNIPAQUE IOHEXOL 350 MG/ML SOLN COMPARISON:  03/10/2015 FINDINGS: Bilateral pleural effusions left greater than right are noted. Some associated left basilar consolidation is seen. The overall appearance is not significantly changed from the prior exam. The thoracic  inlet is within normal limits. The thoracic aorta demonstrates a normal branching pattern. No aneurysmal dilatation or focal dissection is seen. Pulmonary artery is well visualized and demonstrates a normal branching pattern. No definitive filling defects to suggest pulmonary embolism are identified. No significant hilar or mediastinal adenopathy is noted. Scanning into the upper abdomen reveals no acute abnormality. No bony abnormality is seen. Review of the MIP images confirms the above findings. IMPRESSION: No evidence of pulmonary emboli. Bilateral pleural effusions left greater than right with associated left lower lobe consolidation. Electronically Signed   By: Inez Catalina M.D.   On: 06/11/2015 09:06   I have personally reviewed and evaluated these images and lab results as part of my medical decision-making.   EKG Interpretation None      MDM   Final diagnoses:  Pleural effusion  Lupus (systemic lupus erythematosus) ([place])    Plan is  CT of the chest. This will evaluate effusion. We'll evaluate for any pericardial effusion, as well as rule out PE. His pain is primarily pleuritic and right-sided. Clinically has recurrent left-sided effusion. IV site Medrol pain medication. Sedimentation rate labs. I will discuss further care with him, as well as his rheumatologist after completion of today's studies.  CT scan shows a large left pleural effusion with lower lobe infiltrate. He is not febrile, hypoxemic, does not have leukocytosis. His sedimentation rate is elevated. Findings consistent with exacerbation of his SLE. Discussed with Dr. [place] Pink. I also discussed the case with Dr. Ardeen Jourdain, the patient's rheumatologist. She recommends lupus panel complement ANA urine protein/creatinine. If not empyema on pleurocentesis, thin 60 per day of prednisone, and begin taper.    Tanna Furry, MD 06/11/15 [phone]

## 2015-06-11 NOTE — ED Notes (Signed)
Patient stated he did not feel like urinating. Patient informed that a urine specimen was needed.

## 2015-06-11 NOTE — Procedures (Signed)
Ultrasound-guided diagnostic and therapeutic left thoracentesis performed yielding 650 cc of turbid, yellow fluid. No immediate complications. Follow-up chest x-ray pending.The fluid was sent to the lab for preordered studies.

## 2015-06-11 NOTE — Progress Notes (Addendum)
Pt was seen by Marzetta Board from [facility] ans offered resources from [facility]  Pt confirms not pcp no insurance and will be working on resources provided to find a pcp States resources in his bookbag Male family member present at bedside Cm offered also for uninsured accepting pcps, discussed the importance of pcp vs EDP services for f/u care, www.needymeds.org, www.goodrx.com, discounted pharmacies and other [employer] such as [employer], affordable care act, financial assistance, uninsured dental services, [place] med assist, DSS and  health department  Reviewed resources for [employer] uninsured accepting pcps like [facility], family medicine at [employer], [facility] [place], [facility], local urgent care centers, [facility], [facility] family practice, general medical clinics, family services of the [place], [facility] urgent care plus others, medication resources, [facility] out patient pharmacies and housing Pt voiced understanding and appreciation of resources provided    Entered in d/c instructions Please use the resources provided to you in emergency room by case manager to assist with doctor for follow up These [employer] uninsured resources provide possible primary care providers, resources for discounted medications, housing, dental resources, affordable care act information, plus other resources for [employer]

## 2015-06-11 NOTE — ED Notes (Signed)
Pt states he has intermittent chest pain when he takes a deep breath  Pt states the pain is all over in his chest and has been going on off and on for months  Pt states because it hurts to take a deep breath he has been shallow breathing  Pt states he has lupus and is having joint pain  Pt states his sxs started again really on Sunday night  Pt states he has swelling in his joints

## 2015-06-11 NOTE — ED Notes (Signed)
Patient transported to Ultrasound 

## 2015-06-11 NOTE — ED Notes (Signed)
Patient gone to ultrasound  

## 2015-06-11 NOTE — H&P (Signed)
[facility] History and Physical  [name] [id number] DOB: 1970/11/25 DOA: 06/11/2015   PCP: Patient does not have a PCP Specialists: Dr. Gavin Pound is his rheumatologist  Chief Complaint: Pain in the joints and shortness of breath  HPI: [name] is a 45 y.o. male with a past medical history of systemic lupus erythematosus who was hospitalized back in November 2016 for dyspnea and was found to have pleural effusion. He underwent thoracentesis. He was placed on steroids and discharged home. Over the last 3-4 weeks he has experienced worsening pain in the joints of his hands as well as knees and elbows. He's had decreased mobility. He went to see his rheumatologist 3 weeks ago and was started on 20 mg of prednisone. Tapered down to 10 mg recently. However, his symptoms have not improved. He also started getting short of breath. Started having sharp chest pains in the right chest, especially with deep breathing. Denies any nausea, vomiting or diarrhea. No fever, no chills. No cough. As a result of his worsening symptoms, he decided to come into the hospital.  In the emergency department. CT scan of the chest revealed bilateral pleural effusions, left greater than right. He is likely experiencing a flareup of his lupus. He will be admitted for stabilization and further evaluation.  Home Medications: Prior to Admission medications   Medication Sig Start Date End Date Taking? Authorizing Provider  hydroxychloroquine (PLAQUENIL) 200 MG tablet Take 400 mg by mouth daily.   Yes Historical Provider, MD  ibuprofen (ADVIL,MOTRIN) 200 MG tablet Take 600 mg by mouth every 6 (six) hours as needed for headache or moderate pain.   Yes Historical Provider, MD  omeprazole (PRILOSEC) 20 MG capsule Take 1 capsule (20 mg total) by mouth daily. Patient taking differently: Take 20 mg by mouth daily as needed (acid reflux).  03/11/15  Yes Theodis Blaze, MD  predniSONE (DELTASONE) 5 MG tablet  Take 40 mg tablet tomorrow 03/12/2015 and taper down by 5 mg daily until completed 03/11/15  Yes Theodis Blaze, MD  HYDROcodone-acetaminophen (NORCO/VICODIN) 5-325 MG tablet Take 1 tablet by mouth every 6 (six) hours as needed for moderate pain or severe pain. 04/26/15   Forde Dandy, MD  ibuprofen (ADVIL,MOTRIN) 600 MG tablet Take 1 tablet (600 mg total) by mouth every 6 (six) hours as needed for moderate pain. 03/11/15   Theodis Blaze, MD  ibuprofen (ADVIL,MOTRIN) 600 MG tablet Take 1 tablet (600 mg total) by mouth every 6 (six) hours as needed. 04/26/15   Forde Dandy, MD  predniSONE (STERAPRED UNI-PAK 21 TAB) 10 MG (21) TBPK tablet Take 1 tablet (10 mg total) by mouth daily. Take 6 tabs by mouth daily  for 3 days, then 5 tabs for 2 days, then 4 tabs for 2 days, then 3 tabs for 2 days, 2 tabs for 2 days, then 1 tab by mouth daily for 2 days 04/26/15   Forde Dandy, MD    Allergies: No Known Allergies  Past Medical History: Past Medical History  Diagnosis Date  . Lupus ([place])     History reviewed. No pertinent past surgical history.  Social History: Patient lives alone in [name]. He works in [employer]. Denies any smoking. Occasional alcohol use. No illicit drug use. Independent with daily activities.  Family History:  Family History  Problem Relation Age of Onset  . Cancer Mother      Review of Systems - History obtained from the patient General ROS: positive  for  - fatigue Psychological ROS: negative Ophthalmic ROS: negative ENT ROS: negative Allergy and Immunology ROS: negative Hematological and Lymphatic ROS: negative Endocrine ROS: negative Respiratory ROS: as in hpi Cardiovascular ROS: as in hpi Gastrointestinal ROS: no abdominal pain, change in bowel habits, or black or bloody stools Genito-Urinary ROS: no dysuria, trouble voiding, or hematuria Musculoskeletal ROS: as in hpi Neurological ROS: no TIA or stroke symptoms Dermatological ROS: negative  Physical  Examination  Filed Vitals:   06/11/15 0721 06/11/15 0728 06/11/15 0956  BP: 133/93  136/97  Pulse: 92  97  Temp:  98.3 F (36.8 C)   TempSrc:  Oral Oral  Resp: 23  22  SpO2: 95%  93%    BP 136/97 mmHg  Pulse 97  Temp(Src) 98.3 F (36.8 C) (Oral)  Resp 22  SpO2 93%  General appearance: alert, cooperative, appears stated age and no distress Head: Normocephalic, without obvious abnormality, atraumatic Eyes: conjunctivae/corneas clear. PERRL, EOM's intact.  Throat: lips, mucosa, and tongue normal; teeth and gums normal Neck: no adenopathy, no carotid bruit, no JVD, supple, symmetrical, trachea midline and thyroid not enlarged, symmetric, no tenderness/mass/nodules Resp: Diminished air entry at the bases with a few crackles. Dullness to percussion, left greater than right. Cardio: regular rate and rhythm, S1, S2 normal, no murmur, click, rub or gallop GI: soft, non-tender; bowel sounds normal; no masses,  no organomegaly Extremities: There is evidence for erythema over her small joints of hands. No erythema over the knees. Pulses: 2+ and symmetric Skin: Skin color, texture, turgor normal. No rashes or lesions Lymph nodes: Cervical, supraclavicular, and axillary nodes normal. Neurologic: Awake and alert. Oriented 3. No focal neurological deficits.  Laboratory Data: Results for orders placed or performed during the hospital encounter of 06/11/15 (from the past 48 hour(s))  CBC with Differential/Platelet     Status: Abnormal   Collection Time: 06/11/15  7:56 AM  Result Value Ref Range   WBC 3.7 (L) 4.0 - 10.5 K/uL   RBC 4.14 (L) 4.22 - 5.81 MIL/uL   Hemoglobin 12.3 (L) 13.0 - 17.0 g/dL   HCT 89.0 (L) 22.8 - 40.6 %   MCV 87.9 78.0 - 100.0 fL   MCH 29.7 26.0 - 34.0 pg   MCHC 33.8 30.0 - 36.0 g/dL   RDW 98.6 14.8 - 30.7 %   Platelets 259 150 - 400 K/uL   Neutrophils Relative % 85 %   Neutro Abs 3.1 1.7 - 7.7 K/uL   Lymphocytes Relative 10 %   Lymphs Abs 0.4 (L) 0.7 - 4.0 K/uL    Monocytes Relative 3 %   Monocytes Absolute 0.1 0.1 - 1.0 K/uL   Eosinophils Relative 2 %   Eosinophils Absolute 0.1 0.0 - 0.7 K/uL   Basophils Relative 0 %   Basophils Absolute 0.0 0.0 - 0.1 K/uL  Basic metabolic panel     Status: Abnormal   Collection Time: 06/11/15  7:56 AM  Result Value Ref Range   Sodium 136 135 - 145 mmol/L   Potassium 3.8 3.5 - 5.1 mmol/L   Chloride 103 101 - 111 mmol/L   CO2 25 22 - 32 mmol/L   Glucose, Bld 105 (H) 65 - 99 mg/dL   BUN 15 6 - 20 mg/dL   Creatinine, Ser 3.54 0.61 - 1.24 mg/dL   Calcium 8.3 (L) 8.9 - 10.3 mg/dL   GFR calc non Af Amer >60 >60 mL/min   GFR calc Af Amer >60 >60 mL/min    Comment: (NOTE)  The eGFR has been calculated using the CKD EPI equation. This calculation has not been validated in all clinical situations. eGFR's persistently <60 mL/min signify possible Chronic Kidney Disease.    Anion gap 8 5 - 15  Troponin I     Status: None   Collection Time: 06/11/15  7:56 AM  Result Value Ref Range   Troponin I <0.03 <0.031 ng/mL    Comment:        NO INDICATION OF MYOCARDIAL INJURY.   Sedimentation rate     Status: Abnormal   Collection Time: 06/11/15  7:56 AM  Result Value Ref Range   Sed Rate 42 (H) 0 - 16 mm/hr    Radiology Reports: Ct Angio Chest Pe W/cm &/or Wo Cm  06/11/2015  CLINICAL DATA:  Mid chest pain and shortness of Breath for 3 days, history of lupus EXAM: CT ANGIOGRAPHY CHEST WITH CONTRAST TECHNIQUE: Multidetector CT imaging of the chest was performed using the standard protocol during bolus administration of intravenous contrast. Multiplanar CT image reconstructions and MIPs were obtained to evaluate the vascular anatomy. CONTRAST:  175m OMNIPAQUE IOHEXOL 350 MG/ML SOLN COMPARISON:  03/10/2015 FINDINGS: Bilateral pleural effusions left greater than right are noted. Some associated left basilar consolidation is seen. The overall appearance is not significantly changed from the prior exam. The thoracic inlet is  within normal limits. The thoracic aorta demonstrates a normal branching pattern. No aneurysmal dilatation or focal dissection is seen. Pulmonary artery is well visualized and demonstrates a normal branching pattern. No definitive filling defects to suggest pulmonary embolism are identified. No significant hilar or mediastinal adenopathy is noted. Scanning into the upper abdomen reveals no acute abnormality. No bony abnormality is seen. Review of the MIP images confirms the above findings. IMPRESSION: No evidence of pulmonary emboli. Bilateral pleural effusions left greater than right with associated left lower lobe consolidation. Electronically Signed   By: MInez CatalinaM.D.   On: 06/11/2015 09:06    My interpretation of Electrocardiogram: Sinus rhythm in the 90s. Poor R wave progression. Intervals are normal. No definite Q waves. No concerning ST or T-wave changes.  Problem List  Principal Problem:   Pleural effusion Active Problems:   Lupus (systemic lupus erythematosus) ([facility])   Dyspnea   Polyarthralgia   Assessment: This is a 45year old Caucasian male with a past medical history of SLE who comes in with polyarthralgia and dyspnea. He is found to have bilateral pleural effusions. He has evidence for joint inflammation on examination. He is experiencing most likely exacerbation of his lupus. No evidence for infection at this time.  Plan: #1 dyspnea with bilateral pleural effusion: Most likely secondary to SLE. ED physician has discussed the case with the patient's rheumatologist, Dr. HTrudie Reed She does recommend thoracentesis and pleural fluid analysis to rule out infection. I think this is reasonable. Ultrasound-guided thoracentesis has been ordered. Hold off on antibiotics. Patient has been given a dose of Solu-Medrol. Patient had a similar presentation in November 2016 and underwent thoracentesis at that time.  #2 History of SLE with acute flare: He has polyarthralgia. His pleural effusions  is thought to be secondary to SLE. He has been given 125 mg of Solu-Medrol. If pleural fluid does not suggest infection, prednisone can be given at 1 mg per kilogram and then can be tapered down per his rheumatologist. His rheumatologist has also recommended some blood work including ANA, complement levels, ESR, which have been ordered. Continue with his hydroxychloroquine.   DVT Prophylaxis: SCD Code Status: Full  code Family Communication: Discussed with the patient  Disposition Plan: Admit to MedSurg   Further management decisions will depend on results of further testing and patient's response to treatment.   [facility]  [facility] Pager [phone]  If 7PM-7AM, please contact night-coverage [email or web address] Password [facility]  06/11/2015, 10:37 AM

## 2015-06-12 DIAGNOSIS — R0600 Dyspnea, unspecified: Secondary | ICD-10-CM

## 2015-06-12 DIAGNOSIS — J90 Pleural effusion, not elsewhere classified: Principal | ICD-10-CM

## 2015-06-12 DIAGNOSIS — M329 Systemic lupus erythematosus, unspecified: Secondary | ICD-10-CM

## 2015-06-12 LAB — PH, BODY FLUID: pH, Body Fluid: 7.5

## 2015-06-12 LAB — COMPREHENSIVE METABOLIC PANEL
ALT: 16 U/L — ABNORMAL LOW (ref 17–63)
AST: 16 U/L (ref 15–41)
Albumin: 2.8 g/dL — ABNORMAL LOW (ref 3.5–5.0)
Alkaline Phosphatase: 57 U/L (ref 38–126)
Anion gap: 7 (ref 5–15)
BUN: 12 mg/dL (ref 6–20)
CO2: 27 mmol/L (ref 22–32)
Calcium: 8.1 mg/dL — ABNORMAL LOW (ref 8.9–10.3)
Chloride: 104 mmol/L (ref 101–111)
Creatinine, Ser: 0.76 mg/dL (ref 0.61–1.24)
GFR calc Af Amer: >60 mL/min (ref >60)
GFR calc non Af Amer: >60 mL/min (ref >60)
Glucose, Bld: 116 mg/dL — ABNORMAL HIGH (ref 65–99)
Potassium: 3.6 mmol/L (ref 3.5–5.1)
Sodium: 138 mmol/L (ref 135–145)
Total Bilirubin: 0.5 mg/dL (ref 0.3–1.2)
Total Protein: 6.5 g/dL (ref 6.5–8.1)

## 2015-06-12 LAB — PROCALCITONIN: Procalcitonin: <0.1 ng/mL

## 2015-06-12 LAB — CBC
HCT: 34.9 % — ABNORMAL LOW (ref 39.0–52.0)
Hemoglobin: 11.6 g/dL — ABNORMAL LOW (ref 13.0–17.0)
MCH: 29.4 pg (ref 26.0–34.0)
MCHC: 33.2 g/dL (ref 30.0–36.0)
MCV: 88.6 fL (ref 78.0–100.0)
Platelets: 309 10*3/uL (ref 150–400)
RBC: 3.94 MIL/uL — ABNORMAL LOW (ref 4.22–5.81)
RDW: 12.9 % (ref 11.5–15.5)
WBC: 3.1 10*3/uL — ABNORMAL LOW (ref 4.0–10.5)

## 2015-06-12 LAB — PATHOLOGIST SMEAR REVIEW

## 2015-06-12 LAB — C3 COMPLEMENT: C3 Complement: 84 mg/dL (ref 82–167)

## 2015-06-12 LAB — MRSA PCR SCREENING: MRSA by PCR: NEGATIVE

## 2015-06-12 LAB — C4 COMPLEMENT: Complement C4, Body Fluid: 11 mg/dL — ABNORMAL LOW (ref 14–44)

## 2015-06-12 LAB — CREATININE, URINE, RANDOM: Creatinine, Urine: 86.71 mg/dL

## 2015-06-12 LAB — PROTEIN / CREATININE RATIO, URINE
Creatinine, Urine: 86.29 mg/dL
Protein Creatinine Ratio: 0.22 mg/mg{Cre} — ABNORMAL HIGH (ref 0.00–0.15)
Total Protein, Urine: 19 mg/dL

## 2015-06-12 LAB — URIC ACID: Uric Acid, Serum: 4.8 mg/dL (ref 4.4–7.6)

## 2015-06-12 LAB — COMPLEMENT, TOTAL: Compl, Total (CH50): 40 U/mL — ABNORMAL LOW (ref 42–60)

## 2015-06-12 MED ORDER — PREDNISONE 20 MG PO TABS
60.0000 mg | ORAL_TABLET | Freq: Every day | ORAL | Status: DC
  Administered 2015-06-13 – 2015-06-14 (×2): 60 mg via ORAL
  Filled 2015-06-12 (×2): qty 3

## 2015-06-12 MED ORDER — MORPHINE SULFATE (PF) 2 MG/ML IV SOLN
2.0000 mg | INTRAVENOUS | Status: DC | PRN
  Administered 2015-06-12 – 2015-06-14 (×8): 4 mg via INTRAVENOUS
  Filled 2015-06-12 (×8): qty 2

## 2015-06-12 MED ORDER — PIPERACILLIN-TAZOBACTAM 3.375 G IVPB
3.3750 g | Freq: Three times a day (TID) | INTRAVENOUS | Status: DC
  Administered 2015-06-12 – 2015-06-14 (×6): 3.375 g via INTRAVENOUS
  Filled 2015-06-12 (×5): qty 50

## 2015-06-12 MED ORDER — ENOXAPARIN SODIUM 40 MG/0.4ML ~~LOC~~ SOLN
40.0000 mg | SUBCUTANEOUS | Status: DC
  Administered 2015-06-13 – 2015-06-14 (×2): 40 mg via SUBCUTANEOUS
  Filled 2015-06-12 (×2): qty 0.4

## 2015-06-12 MED ORDER — MORPHINE SULFATE (PF) 4 MG/ML IV SOLN
4.0000 mg | Freq: Once | INTRAVENOUS | Status: AC
Start: 2015-06-12 — End: 2015-06-12
  Administered 2015-06-12: 4 mg via INTRAVENOUS
  Filled 2015-06-12: qty 1

## 2015-06-12 NOTE — Progress Notes (Signed)
Patient Demographics  [name], is a 45 y.o. male, DOB - 08/07/1970, [id number]  Admit date - 06/11/2015   Admitting Physician Bonnielee Haff, MD  Outpatient Primary MD for the patient is No PCP Per Patient  LOS - 1   Chief Complaint  Patient presents with  . Chest Pain  . Shortness of Breath       Admission HPI/Brief narrative: 45 year old male with history of SLE, recurrent pleural effusion in the past, status post recent hospitalization and thoracentesis November 2016, presents with reportedly a dysplasia and recurrent pleural effusion, status post thoracentesis 2/14, workup significant for exudate, afebrile, no leukocytosis.  Subjective:   [name] today has, No headache, No chest pain, No abdominal pain - No Nausea, No new weakness tingling or numbness, complaints of polyarthralgia .reports dyspnea significantly improved  Assessment & Plan    Principal Problem:   Pleural effusion Active Problems:   Lupus (systemic lupus erythematosus) (HCC)   Dyspnea   Polyarthralgia  Dyspnea with bilateral pleural effusion  - This is most likely secondary to SLE , status post thoracentesis 2/14, left pleural fluid with 650 mL drained , workup significant for exudate , given significantly elevated LDH, and poor protein/total protein ratio >0.6 - Discussed with pulmonary, usually lupus transudate 1 have low glucose level , so suspicion for rheumatoid factor overlapping with lupus versus parapneumonic effusion , so will check rheumatoid factor, will start empirically on IV Zosyn for possible polyp monitor fusion especially patient is on steroids , pending pleural fluid culture results .  SLE  - Discussed with patient rheumatologist Dr. Trudie Reed, will check a NA panel, are F, uric acid, and CCP , as might have overlapping our F with his LAD especially in the presence of polyarthralgia . - We'll  continue 60 mg oral prednisone daily  - Continue with Plaquenil   Code Status: Full  Family Communication: Discussed with patient  Disposition Plan: Pending pleural effusion culture results  Procedures  Thoracentesis 2/14 by IR   Consults   Discussed with pulmonary via phone Discussed with patient primary rheumatologist via phone   Medications  Scheduled Meds: . docusate sodium  100 mg Oral BID  . hydroxychloroquine  400 mg Oral Daily  . pantoprazole  40 mg Oral Daily  . piperacillin-tazobactam (ZOSYN)  IV  3.375 g Intravenous Q8H  . [START ON 06/13/2015] predniSONE  60 mg Oral Daily   Continuous Infusions:  PRN Meds:.acetaminophen **OR** acetaminophen, albuterol, ibuprofen, morphine injection, ondansetron **OR** ondansetron (ZOFRAN) IV, oxyCODONE  DVT Prophylaxis  Lovenox   Lab Results  Component Value Date   PLT 309 06/12/2015    Antibiotics    Anti-infectives    Start     Dose/Rate Route Frequency Ordered Stop   06/12/15 1400  piperacillin-tazobactam (ZOSYN) IVPB 3.375 g     3.375 g 12.5 mL/hr over 240 Minutes Intravenous Every 8 hours 06/12/15 1338     06/11/15 1300  hydroxychloroquine (PLAQUENIL) tablet 400 mg     400 mg Oral Daily 06/11/15 1154            Objective:   Filed Vitals:   06/11/15 2002 06/11/15 2253 06/12/15 0519 06/12/15 1343  BP: 124/91 139/87 128/88 136/87  Pulse: 96 96 98 100  Temp:  99 F (37.2 C) 98.4 F (36.9 C) 98.1 F (36.7 C)  TempSrc:  Oral Oral Oral  Resp: 9 11 12 14   Height:  5' 11.5" (1.816 m)    Weight:  86.32 kg (190 lb 4.8 oz)    SpO2: 100% 95% 95% 98%    Wt Readings from Last 3 Encounters:  06/11/15 86.32 kg (190 lb 4.8 oz)  03/10/15 90.2 kg (198 lb 13.7 oz)  03/09/15 92.715 kg (204 lb 6.4 oz)     Intake/Output Summary (Last 24 hours) at 06/12/15 1520 Last data filed at 06/12/15 1343  Gross per 24 hour  Intake    720 ml  Output      0 ml  Net    720 ml     Physical Exam  Awake Alert, Oriented X  3, No new F.N deficits, Normal affect [place].AT,PERRAL Supple Neck,No JVD, No cervical lymphadenopathy appriciated.  Symmetrical Chest wall movement, Good air movement bilaterally, diminished air entry as basis, course lung sounds in left lung base is  RRR,No Gallops,Rubs or new Murmurs, No Parasternal Heave +ve B.Sounds, Abd Soft, No tenderness, No organomegaly appriciated, No rebound - guarding or rigidity. No Cyanosis, Clubbing or edema, No new Rash or bruise  , no evidence of erythema or effusion over knees elbows and digit joints   Data Review   Micro Results Recent Results (from the past 240 hour(s))  Culture, body fluid-bottle     Status: None (Preliminary result)   Collection Time: 06/11/15 11:47 AM  Result Value Ref Range Status   Specimen Description FLUID LEFT PLEURAL  Final   Special Requests BOTTLES DRAWN AEROBIC AND ANAEROBIC 10CC  Final   Culture   Final    NO GROWTH < 24 HOURS Performed at [facility]    Report Status PENDING  Incomplete  Gram stain     Status: None   Collection Time: 06/11/15 11:47 AM  Result Value Ref Range Status   Specimen Description FLUID LEFT PLEURAL  Final   Special Requests NONE  Final   Gram Stain   Final    ABUNDANT WBC PRESENT,BOTH PMN AND MONONUCLEAR NO ORGANISMS SEEN    Report Status 06/11/2015 FINAL  Final  Culture, blood (Routine X 2) w Reflex to ID Panel     Status: None (Preliminary result)   Collection Time: 06/11/15 12:04 PM  Result Value Ref Range Status   Specimen Description BLOOD RIGHT ANTECUBITAL  Final   Special Requests BOTTLES DRAWN AEROBIC AND ANAEROBIC 5CC  Final   Culture   Final    NO GROWTH < 24 HOURS Performed at [facility]    Report Status PENDING  Incomplete    Radiology Reports Dg Chest 1 View  06/11/2015  CLINICAL DATA:  Status post left thoracentesis EXAM: CHEST 1 VIEW COMPARISON:  06/11/2015 FINDINGS: Cardiac shadow is within normal limits. Small residual left pleural effusion is noted  following thoracentesis. No pneumothorax is noted. The right leads is clear. No acute bony abnormality is seen. IMPRESSION: No new pneumothorax following left thoracentesis. Small residual left effusion is noted. Electronically Signed   By: Inez Catalina M.D.   On: 06/11/2015 11:58   Ct Angio Chest Pe W/cm &/or Wo Cm  06/11/2015  CLINICAL DATA:  Mid chest pain and shortness of Breath for 3 days, history of lupus EXAM: CT ANGIOGRAPHY CHEST WITH CONTRAST TECHNIQUE: Multidetector CT imaging of the chest was performed using the standard protocol during bolus administration of intravenous  contrast. Multiplanar CT image reconstructions and MIPs were obtained to evaluate the vascular anatomy. CONTRAST:  160mL OMNIPAQUE IOHEXOL 350 MG/ML SOLN COMPARISON:  03/10/2015 FINDINGS: Bilateral pleural effusions left greater than right are noted. Some associated left basilar consolidation is seen. The overall appearance is not significantly changed from the prior exam. The thoracic inlet is within normal limits. The thoracic aorta demonstrates a normal branching pattern. No aneurysmal dilatation or focal dissection is seen. Pulmonary artery is well visualized and demonstrates a normal branching pattern. No definitive filling defects to suggest pulmonary embolism are identified. No significant hilar or mediastinal adenopathy is noted. Scanning into the upper abdomen reveals no acute abnormality. No bony abnormality is seen. Review of the MIP images confirms the above findings. IMPRESSION: No evidence of pulmonary emboli. Bilateral pleural effusions left greater than right with associated left lower lobe consolidation. Electronically Signed   By: Inez Catalina M.D.   On: 06/11/2015 09:06   US Thoracentesis Asp Pleural Space W/img Guide  06/11/2015  INDICATION: Lupus, dyspnea and left pleural effusion. Request is made for diagnostic and therapeutic left thoracentesis. EXAM: ULTRASOUND GUIDED DIAGNOSTIC AND THERAPEUTIC LEFT  THORACENTESIS MEDICATIONS: None. COMPLICATIONS: None immediate. PROCEDURE: An ultrasound guided thoracentesis was thoroughly discussed with the patient and questions answered. The benefits, risks, alternatives and complications were also discussed. The patient understands and wishes to proceed with the procedure. Written consent was obtained. Ultrasound was performed to localize and mark an adequate pocket of fluid in the left chest. The area was then prepped and draped in the normal sterile fashion. 1% Lidocaine was used for local anesthesia. Under ultrasound guidance a 8 Fr Safe-T-Centesis catheter was introduced. Thoracentesis was performed. The catheter was removed and a dressing applied. FINDINGS: A total of approximately 650 cc of turbid, yellow fluid was removed. Samples were sent to the laboratory as requested by the clinical team. IMPRESSION: Successful ultrasound guided diagnostic and therapeutic left thoracentesis yielding 650 cc of pleural fluid. Read by: Rowe Robert, PA-C Electronically Signed   By: Markus Daft M.D.   On: 06/11/2015 11:48     CBC  Recent Labs Lab 06/11/15 0756 06/12/15 0344  WBC 3.7* 3.1*  HGB 12.3* 11.6*  HCT 36.4* 34.9*  PLT 259 309  MCV 87.9 88.6  MCH 29.7 29.4  MCHC 33.8 33.2  RDW 12.8 12.9  LYMPHSABS 0.4*  --   MONOABS 0.1  --   EOSABS 0.1  --   BASOSABS 0.0  --     Chemistries   Recent Labs Lab 06/11/15 0756 06/12/15 0344  NA 136 138  K 3.8 3.6  CL 103 104  CO2 25 27  GLUCOSE 105* 116*  BUN 15 12  CREATININE 0.81 0.76  CALCIUM 8.3* 8.1*  AST  --  16  ALT  --  16*  ALKPHOS  --  57  BILITOT  --  0.5   ------------------------------------------------------------------------------------------------------------------ estimated creatinine clearance is 126.2 mL/min (by C-G formula based on Cr of 0.76). ------------------------------------------------------------------------------------------------------------------ No results for input(s):  HGBA1C in the last 72 hours. ------------------------------------------------------------------------------------------------------------------ No results for input(s): CHOL, HDL, LDLCALC, TRIG, CHOLHDL, LDLDIRECT in the last 72 hours. ------------------------------------------------------------------------------------------------------------------ No results for input(s): TSH, T4TOTAL, T3FREE, THYROIDAB in the last 72 hours.  Invalid input(s): FREET3 ------------------------------------------------------------------------------------------------------------------ No results for input(s): VITAMINB12, FOLATE, FERRITIN, TIBC, IRON, RETICCTPCT in the last 72 hours.  Coagulation profile No results for input(s): INR, PROTIME in the last 168 hours.  No results for input(s): DDIMER in the last 72 hours.  Cardiac Enzymes  Recent Labs Lab 06/11/15 0756  TROPONINI <0.03   ------------------------------------------------------------------------------------------------------------------ Invalid input(s): POCBNP     Time Spent in minutes   35 minutes   Tarry Blayney M.D on 06/12/2015 at 3:20 PM  Between 7am to 7pm - Pager - [phone]  After 7pm go to [email or web address] - password [place]  [facility]   Office  [phone]

## 2015-06-12 NOTE — Progress Notes (Signed)
Pharmacy Antibiotic Note  [name] is a 45 y.o. male with SLEadmitted on 06/11/2015 with c/o SOB.  Chest CTA showed bilateral pleural efffusions with associated LLL consolidation-- s/p thoracentesis on 2/15. Pharmacy has been consulted for zosyn dosing for empiric coverage.  Plan: - zosyn 3.375 gm IV q8h (infuse over 4 hours)  Height: 5' 11.5" (181.6 cm) Weight: 190 lb 4.8 oz (86.32 kg) IBW/kg (Calculated) : 76.45  Temp (24hrs), Avg:98.6 F (37 C), Min:98.3 F (36.8 C), Max:99 F (37.2 C)   Recent Labs Lab 06/11/15 0756 06/12/15 0344  WBC 3.7* 3.1*  CREATININE 0.81 0.76    Estimated Creatinine Clearance: 126.2 mL/min (by C-G formula based on Cr of 0.76).    No Known Allergies  Antimicrobials this admission: 2/15 zosyn>>  Dose adjustments this admission: n/a  Microbiology results: 2/14 bcx x2: 2/14 L pleural fluid:  Thank you for allowing pharmacy to be a part of this patient's care.  Lynelle Doctor 06/12/2015 1:29 PM

## 2015-06-13 LAB — CYCLIC CITRUL PEPTIDE ANTIBODY, IGG/IGA: CCP Antibodies IgG/IgA: 5 units (ref 0–19)

## 2015-06-13 LAB — FANA STAINING PATTERNS: Homogeneous Pattern: 1:640 {titer}

## 2015-06-13 LAB — RHEUMATOID FACTOR: Rhuematoid fact SerPl-aCnc: <10 IU/mL (ref 0.0–13.9)

## 2015-06-13 LAB — ANTINUCLEAR ANTIBODIES, IFA: ANA Ab, IFA: POSITIVE — AB

## 2015-06-13 MED ORDER — KETOROLAC TROMETHAMINE 30 MG/ML IJ SOLN
30.0000 mg | Freq: Once | INTRAMUSCULAR | Status: AC
  Administered 2015-06-13: 30 mg via INTRAVENOUS
  Filled 2015-06-13: qty 1

## 2015-06-13 NOTE — Progress Notes (Signed)
Patient Demographics  [name], is a 45 y.o. male, DOB - Sep 07, 1970, [id number]  Admit date - 06/11/2015   Admitting Physician Bonnielee Haff, MD  Outpatient Primary MD for the patient is No PCP Per Patient  LOS - 2   Chief Complaint  Patient presents with  . Chest Pain  . Shortness of Breath       Admission HPI/Brief narrative: 45 year old male with history of SLE, recurrent pleural effusion in the past, status post recent hospitalization and thoracentesis November 2016, presents with reportedly a dysplasia and recurrent pleural effusion, status post thoracentesis 2/14, workup significant for exudate, afebrile, no leukocytosis.  Subjective:   [name] today has, No headache, No chest pain, No abdominal pain - No Nausea, No new weakness tingling or numbness, complaints of polyarthralgia .reports dyspnea significantly improved  Assessment & Plan    Principal Problem:   Pleural effusion Active Problems:   Lupus (systemic lupus erythematosus) (HCC)   Dyspnea   Polyarthralgia  Dyspnea with bilateral pleural effusion  - This is most likely secondary to SLE , status post thoracentesis 2/14, left pleural fluid with 650 mL drained , workup significant for exudate , given significantly elevated LDH, and poor protein/total protein ratio >0.6 - Discussed with pulmonary, usually lupus transudate don't have low glucose level , so suspicion for rheumatoid disease overlapping with lupus versus parapneumonic effusion , started empirically on IV Zosyn to cover for para pneumonic effusion especially patient is on steroids , pending pleural fluid culture results .  SLE  - Discussed with patient rheumatologist Dr. Trudie Reed, requested to check ANA panel,  rheumatoid factor , uric acid, and CCP , as might have overlapping our F with his LAD especially in the presence of polyarthralgia , so far rheumatoid  factor and uric acid within normal limits. - We'll continue 60 mg oral prednisone daily  - Continue with Plaquenil   Code Status: Full  Family Communication: Discussed with patient  Disposition Plan: Pending pleural effusion culture results  Procedures  Thoracentesis 2/14 by IR   Consults   Discussed with pulmonary via phone Discussed with patient primary rheumatologist via phone   Medications  Scheduled Meds: . docusate sodium  100 mg Oral BID  . enoxaparin (LOVENOX) injection  40 mg Subcutaneous Q24H  . hydroxychloroquine  400 mg Oral Daily  . pantoprazole  40 mg Oral Daily  . piperacillin-tazobactam (ZOSYN)  IV  3.375 g Intravenous Q8H  . predniSONE  60 mg Oral Daily   Continuous Infusions:  PRN Meds:.acetaminophen **OR** acetaminophen, albuterol, morphine injection, ondansetron **OR** ondansetron (ZOFRAN) IV, oxyCODONE  DVT Prophylaxis  Lovenox   Lab Results  Component Value Date   PLT 309 06/12/2015    Antibiotics    Anti-infectives    Start     Dose/Rate Route Frequency Ordered Stop   06/12/15 1400  piperacillin-tazobactam (ZOSYN) IVPB 3.375 g     3.375 g 12.5 mL/hr over 240 Minutes Intravenous Every 8 hours 06/12/15 1338     06/11/15 1300  hydroxychloroquine (PLAQUENIL) tablet 400 mg     400 mg Oral Daily 06/11/15 1154            Objective:   Filed Vitals:   06/12/15 2151 06/13/15 0513 06/13/15 0800 06/13/15  1330  BP: 127/82 121/74 125/75 125/74  Pulse: 88 81 84 76  Temp: 98.6 F (37 C) 98.2 F (36.8 C) 98.3 F (36.8 C) 98.6 F (37 C)  TempSrc: Oral Oral Oral Oral  Resp: 14 14 14 14   Height:      Weight:      SpO2: 97% 95% 93% 93%    Wt Readings from Last 3 Encounters:  06/11/15 86.32 kg (190 lb 4.8 oz)  03/10/15 90.2 kg (198 lb 13.7 oz)  03/09/15 92.715 kg (204 lb 6.4 oz)     Intake/Output Summary (Last 24 hours) at 06/13/15 1514 Last data filed at 06/13/15 0513  Gross per 24 hour  Intake    700 ml  Output      0 ml  Net     700 ml     Physical Exam  Awake Alert, Oriented X 3, No new F.N deficits, Normal affect .AT,PERRAL Supple Neck,No JVD, No cervical lymphadenopathy appriciated.  Symmetrical Chest wall movement, Good air movement bilaterally, diminished air entry as basis, course lung sounds in left lung base is  RRR,No Gallops,Rubs or new Murmurs, No Parasternal Heave +ve B.Sounds, Abd Soft, No tenderness, No organomegaly appriciated, No rebound - guarding or rigidity. No Cyanosis, Clubbing or edema, No new Rash or bruise  , no evidence of erythema or effusion over knees elbows and digit joints   Data Review   Micro Results Recent Results (from the past 240 hour(s))  Culture, body fluid-bottle     Status: None (Preliminary result)   Collection Time: 06/11/15 11:47 AM  Result Value Ref Range Status   Specimen Description FLUID LEFT PLEURAL  Final   Special Requests BOTTLES DRAWN AEROBIC AND ANAEROBIC 10CC  Final   Culture   Final    NO GROWTH < 24 HOURS Performed at [facility]    Report Status PENDING  Incomplete  Gram stain     Status: None   Collection Time: 06/11/15 11:47 AM  Result Value Ref Range Status   Specimen Description FLUID LEFT PLEURAL  Final   Special Requests NONE  Final   Gram Stain   Final    ABUNDANT WBC PRESENT,BOTH PMN AND MONONUCLEAR NO ORGANISMS SEEN    Report Status 06/11/2015 FINAL  Final  Culture, blood (Routine X 2) w Reflex to ID Panel     Status: None (Preliminary result)   Collection Time: 06/11/15 12:04 PM  Result Value Ref Range Status   Specimen Description BLOOD RIGHT ANTECUBITAL  Final   Special Requests BOTTLES DRAWN AEROBIC AND ANAEROBIC 5CC  Final   Culture   Final    NO GROWTH 2 DAYS Performed at [facility]    Report Status PENDING  Incomplete  Culture, blood (Routine X 2) w Reflex to ID Panel     Status: None (Preliminary result)   Collection Time: 06/11/15  9:25 PM  Result Value Ref Range Status   Specimen Description  BLOOD LEFT ANTECUBITAL  Final   Special Requests BOTTLES DRAWN AEROBIC AND ANAEROBIC 5ML  Final   Culture   Final    NO GROWTH 1 DAY Performed at [facility]    Report Status PENDING  Incomplete  MRSA PCR Screening     Status: None   Collection Time: 06/12/15  3:22 PM  Result Value Ref Range Status   MRSA by PCR NEGATIVE NEGATIVE Final    Comment:        The GeneXpert MRSA Assay (FDA approved  for NASAL specimens only), is one component of a comprehensive MRSA colonization surveillance program. It is not intended to diagnose MRSA infection nor to guide or monitor treatment for MRSA infections.     Radiology Reports Dg Chest 1 View  06/11/2015  CLINICAL DATA:  Status post left thoracentesis EXAM: CHEST 1 VIEW COMPARISON:  06/11/2015 FINDINGS: Cardiac shadow is within normal limits. Small residual left pleural effusion is noted following thoracentesis. No pneumothorax is noted. The right leads is clear. No acute bony abnormality is seen. IMPRESSION: No new pneumothorax following left thoracentesis. Small residual left effusion is noted. Electronically Signed   By: Inez Catalina M.D.   On: 06/11/2015 11:58   Ct Angio Chest Pe W/cm &/or Wo Cm  06/11/2015  CLINICAL DATA:  Mid chest pain and shortness of Breath for 3 days, history of lupus EXAM: CT ANGIOGRAPHY CHEST WITH CONTRAST TECHNIQUE: Multidetector CT imaging of the chest was performed using the standard protocol during bolus administration of intravenous contrast. Multiplanar CT image reconstructions and MIPs were obtained to evaluate the vascular anatomy. CONTRAST:  136mL OMNIPAQUE IOHEXOL 350 MG/ML SOLN COMPARISON:  03/10/2015 FINDINGS: Bilateral pleural effusions left greater than right are noted. Some associated left basilar consolidation is seen. The overall appearance is not significantly changed from the prior exam. The thoracic inlet is within normal limits. The thoracic aorta demonstrates a normal branching pattern. No  aneurysmal dilatation or focal dissection is seen. Pulmonary artery is well visualized and demonstrates a normal branching pattern. No definitive filling defects to suggest pulmonary embolism are identified. No significant hilar or mediastinal adenopathy is noted. Scanning into the upper abdomen reveals no acute abnormality. No bony abnormality is seen. Review of the MIP images confirms the above findings. IMPRESSION: No evidence of pulmonary emboli. Bilateral pleural effusions left greater than right with associated left lower lobe consolidation. Electronically Signed   By: Inez Catalina M.D.   On: 06/11/2015 09:06   US Thoracentesis Asp Pleural Space W/img Guide  06/11/2015  INDICATION: Lupus, dyspnea and left pleural effusion. Request is made for diagnostic and therapeutic left thoracentesis. EXAM: ULTRASOUND GUIDED DIAGNOSTIC AND THERAPEUTIC LEFT THORACENTESIS MEDICATIONS: None. COMPLICATIONS: None immediate. PROCEDURE: An ultrasound guided thoracentesis was thoroughly discussed with the patient and questions answered. The benefits, risks, alternatives and complications were also discussed. The patient understands and wishes to proceed with the procedure. Written consent was obtained. Ultrasound was performed to localize and mark an adequate pocket of fluid in the left chest. The area was then prepped and draped in the normal sterile fashion. 1% Lidocaine was used for local anesthesia. Under ultrasound guidance a 8 Fr Safe-T-Centesis catheter was introduced. Thoracentesis was performed. The catheter was removed and a dressing applied. FINDINGS: A total of approximately 650 cc of turbid, yellow fluid was removed. Samples were sent to the laboratory as requested by the clinical team. IMPRESSION: Successful ultrasound guided diagnostic and therapeutic left thoracentesis yielding 650 cc of pleural fluid. Read by: Rowe Robert, PA-C Electronically Signed   By: Markus Daft M.D.   On: 06/11/2015 11:48      CBC  Recent Labs Lab 06/11/15 0756 06/12/15 0344  WBC 3.7* 3.1*  HGB 12.3* 11.6*  HCT 36.4* 34.9*  PLT 259 309  MCV 87.9 88.6  MCH 29.7 29.4  MCHC 33.8 33.2  RDW 12.8 12.9  LYMPHSABS 0.4*  --   MONOABS 0.1  --   EOSABS 0.1  --   BASOSABS 0.0  --     Chemistries  Recent Labs Lab 06/11/15 0756 06/12/15 0344  NA 136 138  K 3.8 3.6  CL 103 104  CO2 25 27  GLUCOSE 105* 116*  BUN 15 12  CREATININE 0.81 0.76  CALCIUM 8.3* 8.1*  AST  --  16  ALT  --  16*  ALKPHOS  --  57  BILITOT  --  0.5   ------------------------------------------------------------------------------------------------------------------ estimated creatinine clearance is 126.2 mL/min (by C-G formula based on Cr of 0.76). ------------------------------------------------------------------------------------------------------------------ No results for input(s): HGBA1C in the last 72 hours. ------------------------------------------------------------------------------------------------------------------ No results for input(s): CHOL, HDL, LDLCALC, TRIG, CHOLHDL, LDLDIRECT in the last 72 hours. ------------------------------------------------------------------------------------------------------------------ No results for input(s): TSH, T4TOTAL, T3FREE, THYROIDAB in the last 72 hours.  Invalid input(s): FREET3 ------------------------------------------------------------------------------------------------------------------ No results for input(s): VITAMINB12, FOLATE, FERRITIN, TIBC, IRON, RETICCTPCT in the last 72 hours.  Coagulation profile No results for input(s): INR, PROTIME in the last 168 hours.  No results for input(s): DDIMER in the last 72 hours.  Cardiac Enzymes  Recent Labs Lab 06/11/15 0756  TROPONINI <0.03   ------------------------------------------------------------------------------------------------------------------ Invalid input(s): POCBNP     Time Spent in minutes   25  minutes   Paradise Vensel M.D on 06/13/2015 at 3:14 PM  Between 7am to 7pm - Pager - [phone]  After 7pm go to [email or web address] - password [facility]  [facility]   Office  [phone]

## 2015-06-14 LAB — PROCALCITONIN: Procalcitonin: <0.1 ng/mL

## 2015-06-14 MED ORDER — ACETAMINOPHEN 325 MG PO TABS: 650.0000 mg | ORAL_TABLET | Freq: Four times a day (QID) | ORAL | Status: DC | PRN

## 2015-06-14 MED ORDER — PANTOPRAZOLE SODIUM 40 MG PO TBEC: 40.0000 mg | DELAYED_RELEASE_TABLET | Freq: Every day | ORAL | Status: DC

## 2015-06-14 MED ORDER — OXYCODONE HCL 5 MG PO TABS: 5.0000 mg | ORAL_TABLET | ORAL | Status: DC | PRN

## 2015-06-14 MED ORDER — PREDNISONE 10 MG PO TABS: ORAL_TABLET | ORAL | Status: DC

## 2015-06-14 NOTE — Discharge Instructions (Signed)
Follow with your rheumatology physician in 7 days   Get CBC, CMP, 2 view Chest X ray checked  by Primary MD next visit.    Activity: As tolerated with Full fall precautions use walker/cane & assistance as needed   Disposition Home   Diet: Regular , with feeding assistance and aspiration precautions.  On your next visit with your primary care physician please Get Medicines reviewed and adjusted.   Please request your Prim.MD to go over all Hospital Tests and Procedure/Radiological results at the follow up, please get all Hospital records sent to your Prim MD by signing hospital release before you go home.   If you experience worsening of your admission symptoms, develop shortness of breath, life threatening emergency, suicidal or homicidal thoughts you must seek medical attention immediately by calling 911 or calling your MD immediately  if symptoms less severe.  You Must read complete instructions/literature along with all the possible adverse reactions/side effects for all the Medicines you take and that have been prescribed to you. Take any new Medicines after you have completely understood and accpet all the possible adverse reactions/side effects.   Do not drive, operating heavy machinery, perform activities at heights, swimming or participation in water activities or provide baby sitting services if your were admitted for syncope or siezures until you have seen by Primary MD or a Neurologist and advised to do so again.  Do not drive when taking Pain medications.    Do not take more than prescribed Pain, Sleep and Anxiety Medications  Special Instructions: If you have smoked or chewed Tobacco  in the last 2 yrs please stop smoking, stop any regular Alcohol  and or any Recreational drug use.  Wear Seat belts while driving.   Please note  You were cared for by a hospitalist during your hospital stay. If you have any questions about your discharge medications or the care you  received while you were in the hospital after you are discharged, you can call the unit and asked to speak with the hospitalist on call if the hospitalist that took care of you is not available. Once you are discharged, your primary care physician will handle any further medical issues. Please note that NO REFILLS for any discharge medications will be authorized once you are discharged, as it is imperative that you return to your primary care physician (or establish a relationship with a primary care physician if you do not have one) for your aftercare needs so that they can reassess your need for medications and monitor your lab values.

## 2015-06-14 NOTE — Progress Notes (Signed)
Discharge instructions reviewed with pt. No current questions or concerns. Pt is stable at baseline.

## 2015-06-14 NOTE — Progress Notes (Signed)
Pharmacy Antibiotic Note  [name] is a 45 y.o. male with hx of SLE admitted on 06/11/2015 with complaints of SOB off and on for several months. Chest CTA showed bilateral pleural effusions with associated LLL consolidation - s/p thoracentesis on 2/15. Pharmacy has been consulted for Zosyn dosing for empiric coverage of possible parapneumonic effusion.  Plan: Zosyn 3.375g IV q8h (4 hour infusion).  Height: 5' 11.5" (181.6 cm) Weight: 190 lb 4.8 oz (86.32 kg) IBW/kg (Calculated) : 76.45  Temp (24hrs), Avg:98.2 F (36.8 C), Min:97.9 F (36.6 C), Max:98.6 F (37 C)   Recent Labs Lab 06/11/15 0756 06/12/15 0344  WBC 3.7* 3.1*  CREATININE 0.81 0.76    Estimated Creatinine Clearance: 126.2 mL/min (by C-G formula based on Cr of 0.76).    No Known Allergies  Antimicrobials this admission:  2/15 Zosyn >>  Dose adjustments this admission: None  Microbiology results: 2/14 BCx x 2: 2/14 L pleural fluid: 2/15 MRSA PCR: Neg  Thank you for allowing pharmacy to be a part of this patient's care.  Darvin Neighbours  [facility] PharmD Candidate 06/14/2015 9:45 AM

## 2015-06-14 NOTE — Discharge Summary (Signed)
[name], is a 45 y.o. male  DOB 21-Oct-1970  MRN [id number].  Admission date:  06/11/2015  Admitting Physician  Bonnielee Haff, MD  Discharge Date:  06/14/2015   Primary MD  No PCP Per Patient  Recommendations for primary care physician for things to follow:  - Please check CBC, BMP during next visit - Please check 2 view chest x-ray during next visit   Admission Diagnosis  Dyspnea [R06.00] Pleural effusion [J90] Lupus (systemic lupus erythematosus) ([place]) [M32.9] S/P thoracentesis [Z98.890]   Discharge Diagnosis  Dyspnea [R06.00] Pleural effusion [J90] Lupus (systemic lupus erythematosus) ([place]) [M32.9] S/P thoracentesis [Z98.890]    Principal Problem:   Pleural effusion Active Problems:   Lupus (systemic lupus erythematosus) ([place])   Dyspnea   Polyarthralgia      Past Medical History  Diagnosis Date  . Lupus ([place])     History reviewed. No pertinent past surgical history.     History of present illness and  Hospital Course:     Kindly see H&P for history of present illness and admission details, please review complete Labs, Consult reports and Test reports for all details in brief  HPI  from the history and physical done on the day of admission 06/11/2015  HPI: [name] is a 45 y.o. male with a past medical history of systemic lupus erythematosus who was hospitalized back in November 2016 for dyspnea and was found to have pleural effusion. He underwent thoracentesis. He was placed on steroids and discharged home. Over the last 3-4 weeks he has experienced worsening pain in the joints of his hands as well as knees and elbows. He's had decreased mobility. He went to see his rheumatologist 3 weeks ago and was started on 20 mg of prednisone. Tapered down to 10 mg recently. However, his symptoms have not improved. He also started getting short of breath. Started having sharp chest  pains in the right chest, especially with deep breathing. Denies any nausea, vomiting or diarrhea. No fever, no chills. No cough. As a result of his worsening symptoms, he decided to come into the hospital.  In the emergency department. CT scan of the chest revealed bilateral pleural effusions, left greater than right. He is likely experiencing a flareup of his lupus. He will be admitted for stabilization and further evaluation.  Hospital Course  45 year old male with history of SLE, recurrent pleural effusion in the past, status post recent hospitalization and thoracentesis November 2016, presents with reportedly dyspnea and recurrent pleural effusion, status post thoracentesis 2/14, workup significant for exudate, afebrile, no leukocytosis, treated empirically with IV Zosyn, pleura culture negative over last 48 hours, so antibiotic been stopped, will be discharged on by mouth prednisone.  Dyspnea with bilateral pleural effusion  - This is most likely secondary to SLE , status post thoracentesis 2/14, left pleural fluid with 650 mL drained , workup significant for exudate , given significantly elevated LDH, and poor protein/total protein ratio >0.6 - Discussed with pulmonary, usually lupus transudate don't have low glucose level ,  so suspicion for rheumatoid disease overlapping with lupus versus parapneumonic effusion , rheumatoid factor within normal limits ,started empirically on IV Zosyn to cover for para pneumonic effusion especially patient is on steroids , patient to Fu with cultures remain negative over last 48 hours, so antibodies has been stopped, patient will be discharged on by mouth steroid tapering dose, encouraged again to use incentive spirometry.  SLE  - Discussed with patient rheumatologist Dr. Trudie Reed, requested to check ANA panel, rheumatoid factor , uric acid, and CCP , rheumatoid factor , uric acid , CCP ,within normal limits, patient has positive ANA antibodies. - We'll  discharge on prednisone tapered dose. - Continue with Plaquenil - Patient instructed to follow with rheumatology in 1 week   Discharge Condition:  stable   Follow UP  Follow-up Information    Follow up with Please use the resources provided to you in emergency room by case manager to assist with doctor for follow up .   Contact information:   These [place] uninsured resources provide possible primary care providers, resources for discounted medications, housing, dental resources, affordable care act information, plus other resources for [facility]        Follow up with Campbell Lerner, MD. Schedule an appointment as soon as possible for a visit in 1 week.   Specialty:  Rheumatology   Contact information:   [place] [address] [phone]         Discharge Instructions  and  Discharge Medications     Discharge Instructions    Discharge instructions    Complete by:  As directed   Follow with your rheumatology physician in 7 days   Get CBC, CMP, 2 view Chest X ray checked  by Primary MD next visit.    Activity: As tolerated with Full fall precautions use walker/cane & assistance as needed   Disposition Home   Diet: Regular , with feeding assistance and aspiration precautions.  On your next visit with your primary care physician please Get Medicines reviewed and adjusted.   Please request your Prim.MD to go over all Hospital Tests and Procedure/Radiological results at the follow up, please get all Hospital records sent to your Prim MD by signing hospital release before you go home.   If you experience worsening of your admission symptoms, develop shortness of breath, life threatening emergency, suicidal or homicidal thoughts you must seek medical attention immediately by calling 911 or calling your MD immediately  if symptoms less severe.  You Must read complete instructions/literature along with all the possible adverse  reactions/side effects for all the Medicines you take and that have been prescribed to you. Take any new Medicines after you have completely understood and accpet all the possible adverse reactions/side effects.   Do not drive, operating heavy machinery, perform activities at heights, swimming or participation in water activities or provide baby sitting services if your were admitted for syncope or siezures until you have seen by Primary MD or a Neurologist and advised to do so again.  Do not drive when taking Pain medications.    Do not take more than prescribed Pain, Sleep and Anxiety Medications  Special Instructions: If you have smoked or chewed Tobacco  in the last 2 yrs please stop smoking, stop any regular Alcohol  and or any Recreational drug use.  Wear Seat belts while driving.   Please note  You were cared for by a hospitalist during your hospital stay. If  you have any questions about your discharge medications or the care you received while you were in the hospital after you are discharged, you can call the unit and asked to speak with the hospitalist on call if the hospitalist that took care of you is not available. Once you are discharged, your primary care physician will handle any further medical issues. Please note that NO REFILLS for any discharge medications will be authorized once you are discharged, as it is imperative that you return to your primary care physician (or establish a relationship with a primary care physician if you do not have one) for your aftercare needs so that they can reassess your need for medications and monitor your lab values.            Medication List    STOP taking these medications        HYDROcodone-acetaminophen 5-325 MG tablet  Commonly known as:  NORCO/VICODIN     ibuprofen 200 MG tablet  Commonly known as:  ADVIL,MOTRIN     ibuprofen 600 MG tablet  Commonly known as:  ADVIL,MOTRIN     omeprazole 20 MG capsule  Commonly known as:   PRILOSEC  Replaced by:  pantoprazole 40 MG tablet      TAKE these medications        acetaminophen 325 MG tablet  Commonly known as:  TYLENOL  Take 2 tablets (650 mg total) by mouth every 6 (six) hours as needed for mild pain (or Fever >/= 101).     hydroxychloroquine 200 MG tablet  Commonly known as:  PLAQUENIL  Take 400 mg by mouth daily.     oxyCODONE 5 MG immediate release tablet  Commonly known as:  Oxy IR/ROXICODONE  Take 1 tablet (5 mg total) by mouth every 4 (four) hours as needed for moderate pain.     pantoprazole 40 MG tablet  Commonly known as:  PROTONIX  Take 1 tablet (40 mg total) by mouth daily.     predniSONE 10 MG tablet  Commonly known as:  DELTASONE  Please take 6 tablets for 3 days, then 5 tablets for 3 days, then 4 tablets for 3 days, then 2 tablets for 3 days, then 1 tablet for 3 days then stop.          Diet and Activity recommendation: See Discharge Instructions above   Consults obtained -  Discussed with pulmonary via phone Discussed with patient primary rheumatologist via phone   Major procedures and Radiology Reports - PLEASE review detailed and final reports for all details, in brief -      Dg Chest 1 View  06/11/2015  CLINICAL DATA:  Status post left thoracentesis EXAM: CHEST 1 VIEW COMPARISON:  06/11/2015 FINDINGS: Cardiac shadow is within normal limits. Small residual left pleural effusion is noted following thoracentesis. No pneumothorax is noted. The right leads is clear. No acute bony abnormality is seen. IMPRESSION: No new pneumothorax following left thoracentesis. Small residual left effusion is noted. Electronically Signed   By: Inez Catalina M.D.   On: 06/11/2015 11:58   Ct Angio Chest Pe W/cm &/or Wo Cm  06/11/2015  CLINICAL DATA:  Mid chest pain and shortness of Breath for 3 days, history of lupus EXAM: CT ANGIOGRAPHY CHEST WITH CONTRAST TECHNIQUE: Multidetector CT imaging of the chest was performed using the standard protocol  during bolus administration of intravenous contrast. Multiplanar CT image reconstructions and MIPs were obtained to evaluate the vascular anatomy. CONTRAST:  151mL OMNIPAQUE IOHEXOL 350 MG/ML  SOLN COMPARISON:  03/10/2015 FINDINGS: Bilateral pleural effusions left greater than right are noted. Some associated left basilar consolidation is seen. The overall appearance is not significantly changed from the prior exam. The thoracic inlet is within normal limits. The thoracic aorta demonstrates a normal branching pattern. No aneurysmal dilatation or focal dissection is seen. Pulmonary artery is well visualized and demonstrates a normal branching pattern. No definitive filling defects to suggest pulmonary embolism are identified. No significant hilar or mediastinal adenopathy is noted. Scanning into the upper abdomen reveals no acute abnormality. No bony abnormality is seen. Review of the MIP images confirms the above findings. IMPRESSION: No evidence of pulmonary emboli. Bilateral pleural effusions left greater than right with associated left lower lobe consolidation. Electronically Signed   By: Inez Catalina M.D.   On: 06/11/2015 09:06   US Thoracentesis Asp Pleural Space W/img Guide  06/11/2015  INDICATION: Lupus, dyspnea and left pleural effusion. Request is made for diagnostic and therapeutic left thoracentesis. EXAM: ULTRASOUND GUIDED DIAGNOSTIC AND THERAPEUTIC LEFT THORACENTESIS MEDICATIONS: None. COMPLICATIONS: None immediate. PROCEDURE: An ultrasound guided thoracentesis was thoroughly discussed with the patient and questions answered. The benefits, risks, alternatives and complications were also discussed. The patient understands and wishes to proceed with the procedure. Written consent was obtained. Ultrasound was performed to localize and mark an adequate pocket of fluid in the left chest. The area was then prepped and draped in the normal sterile fashion. 1% Lidocaine was used for local anesthesia. Under  ultrasound guidance a 8 Fr Safe-T-Centesis catheter was introduced. Thoracentesis was performed. The catheter was removed and a dressing applied. FINDINGS: A total of approximately 650 cc of turbid, yellow fluid was removed. Samples were sent to the laboratory as requested by the clinical team. IMPRESSION: Successful ultrasound guided diagnostic and therapeutic left thoracentesis yielding 650 cc of pleural fluid. Read by: Rowe Robert, PA-C Electronically Signed   By: Markus Daft M.D.   On: 06/11/2015 11:48    Micro Results     Recent Results (from the past 240 hour(s))  Culture, body fluid-bottle     Status: None (Preliminary result)   Collection Time: 06/11/15 11:47 AM  Result Value Ref Range Status   Specimen Description FLUID LEFT PLEURAL  Final   Special Requests BOTTLES DRAWN AEROBIC AND ANAEROBIC 10CC  Final   Culture   Final    NO GROWTH 2 DAYS Performed at [facility]    Report Status PENDING  Incomplete  Gram stain     Status: None   Collection Time: 06/11/15 11:47 AM  Result Value Ref Range Status   Specimen Description FLUID LEFT PLEURAL  Final   Special Requests NONE  Final   Gram Stain   Final    ABUNDANT WBC PRESENT,BOTH PMN AND MONONUCLEAR NO ORGANISMS SEEN    Report Status 06/11/2015 FINAL  Final  Culture, blood (Routine X 2) w Reflex to ID Panel     Status: None (Preliminary result)   Collection Time: 06/11/15 12:04 PM  Result Value Ref Range Status   Specimen Description BLOOD RIGHT ANTECUBITAL  Final   Special Requests BOTTLES DRAWN AEROBIC AND ANAEROBIC 5CC  Final   Culture   Final    NO GROWTH 2 DAYS Performed at [facility]    Report Status PENDING  Incomplete  Culture, blood (Routine X 2) w Reflex to ID Panel     Status: None (Preliminary result)   Collection Time: 06/11/15  9:25 PM  Result Value Ref Range Status  Specimen Description BLOOD LEFT ANTECUBITAL  Final   Special Requests BOTTLES DRAWN AEROBIC AND ANAEROBIC 5ML  Final    Culture   Final    NO GROWTH 1 DAY Performed at [facility]    Report Status PENDING  Incomplete  MRSA PCR Screening     Status: None   Collection Time: 06/12/15  3:22 PM  Result Value Ref Range Status   MRSA by PCR NEGATIVE NEGATIVE Final    Comment:        The GeneXpert MRSA Assay (FDA approved for NASAL specimens only), is one component of a comprehensive MRSA colonization surveillance program. It is not intended to diagnose MRSA infection nor to guide or monitor treatment for MRSA infections.        Today   Subjective:   Michele Risner today has no headache,no abdominal pain, reports pleuritic chest pain significantly improved ,no new weakness tingling or numbness, feels much better wants to go home today.   Objective:   Blood pressure 121/74, pulse 77, temperature 98.1 F (36.7 C), temperature source Oral, resp. rate 16, height 5' 11.5" (1.816 m), weight 86.32 kg (190 lb 4.8 oz), SpO2 96 %.   Intake/Output Summary (Last 24 hours) at 06/14/15 1223 Last data filed at 06/14/15 1023  Gross per 24 hour  Intake   1856 ml  Output      0 ml  Net   1856 ml    Exam Awake Alert, Oriented x 3, No new F.N deficits, Normal affect [place].AT,PERRAL Supple Neck,No JVD, No cervical lymphadenopathy appriciated.  Symmetrical Chest wall movement, Good air movement bilaterally, CTAB RRR,No Gallops,Rubs or new Murmurs, No Parasternal Heave +ve B.Sounds, Abd Soft, Non tender, No organomegaly appriciated, No rebound -guarding or rigidity. No Cyanosis, Clubbing or edema, No new Rash or bruise  Data Review   CBC w Diff: Lab Results  Component Value Date   WBC 3.1* 06/12/2015   HGB 11.6* 06/12/2015   HCT 34.9* 06/12/2015   PLT 309 06/12/2015   LYMPHOPCT 10 06/11/2015   MONOPCT 3 06/11/2015   EOSPCT 2 06/11/2015   BASOPCT 0 06/11/2015    CMP: Lab Results  Component Value Date   NA 138 06/12/2015   K 3.6 06/12/2015   CL 104 06/12/2015   CO2 27 06/12/2015   BUN 12  06/12/2015   CREATININE 0.76 06/12/2015   PROT 6.5 06/12/2015   ALBUMIN 2.8* 06/12/2015   BILITOT 0.5 06/12/2015   ALKPHOS 57 06/12/2015   AST 16 06/12/2015   ALT 16* 06/12/2015  .   Total Time in preparing paper work, data evaluation and todays exam - 35 minutes  ELGERGAWY, DAWOOD M.D on 06/14/2015 at 12:23 PM  [facility]   Office  [phone]

## 2015-06-16 LAB — CULTURE, BLOOD (ROUTINE X 2): Culture: NO GROWTH

## 2015-06-16 LAB — CULTURE, BODY FLUID W GRAM STAIN -BOTTLE: Culture: NO GROWTH

## 2015-06-16 LAB — CULTURE, BODY FLUID-BOTTLE

## 2015-06-17 LAB — CULTURE, BLOOD (ROUTINE X 2): Culture: NO GROWTH

## 2016-05-20 DIAGNOSIS — J90 Pleural effusion, not elsewhere classified: Secondary | ICD-10-CM | POA: Diagnosis not present

## 2016-05-20 DIAGNOSIS — I313 Pericardial effusion (noninflammatory): Secondary | ICD-10-CM | POA: Diagnosis not present

## 2016-05-20 DIAGNOSIS — M329 Systemic lupus erythematosus, unspecified: Secondary | ICD-10-CM | POA: Diagnosis not present

## 2016-05-20 DIAGNOSIS — Z79899 Other long term (current) drug therapy: Secondary | ICD-10-CM | POA: Diagnosis not present

## 2016-05-20 DIAGNOSIS — M064 Inflammatory polyarthropathy: Secondary | ICD-10-CM | POA: Diagnosis not present

## 2016-06-03 DIAGNOSIS — M329 Systemic lupus erythematosus, unspecified: Secondary | ICD-10-CM | POA: Diagnosis not present

## 2016-06-17 DIAGNOSIS — M329 Systemic lupus erythematosus, unspecified: Secondary | ICD-10-CM | POA: Diagnosis not present

## 2016-07-02 DIAGNOSIS — M329 Systemic lupus erythematosus, unspecified: Secondary | ICD-10-CM | POA: Diagnosis not present

## 2016-07-22 DIAGNOSIS — Z6825 Body mass index (BMI) 25.0-25.9, adult: Secondary | ICD-10-CM | POA: Diagnosis not present

## 2016-07-22 DIAGNOSIS — M064 Inflammatory polyarthropathy: Secondary | ICD-10-CM | POA: Diagnosis not present

## 2016-07-22 DIAGNOSIS — M329 Systemic lupus erythematosus, unspecified: Secondary | ICD-10-CM | POA: Diagnosis not present

## 2016-07-22 DIAGNOSIS — E663 Overweight: Secondary | ICD-10-CM | POA: Diagnosis not present

## 2016-08-12 DIAGNOSIS — M329 Systemic lupus erythematosus, unspecified: Secondary | ICD-10-CM | POA: Diagnosis not present

## 2016-09-09 DIAGNOSIS — M329 Systemic lupus erythematosus, unspecified: Secondary | ICD-10-CM | POA: Diagnosis not present

## 2016-09-21 ENCOUNTER — Emergency Department (HOSPITAL_COMMUNITY): Payer: BLUE CROSS/BLUE SHIELD

## 2016-09-21 ENCOUNTER — Encounter (HOSPITAL_COMMUNITY): Payer: Self-pay | Admitting: Emergency Medicine

## 2016-09-21 ENCOUNTER — Emergency Department (HOSPITAL_COMMUNITY)
Admission: EM | Admit: 2016-09-21 | Discharge: 2016-09-21 | Disposition: A | Discharge status: Home / Self Care | Payer: BLUE CROSS/BLUE SHIELD | Attending: Emergency Medicine | Admitting: Emergency Medicine

## 2016-09-21 DIAGNOSIS — Z79899 Other long term (current) drug therapy: Secondary | ICD-10-CM | POA: Diagnosis not present

## 2016-09-21 DIAGNOSIS — M25511 Pain in right shoulder: Secondary | ICD-10-CM | POA: Diagnosis not present

## 2016-09-21 DIAGNOSIS — M329 Systemic lupus erythematosus, unspecified: Secondary | ICD-10-CM | POA: Diagnosis not present

## 2016-09-21 DIAGNOSIS — M2550 Pain in unspecified joint: Secondary | ICD-10-CM

## 2016-09-21 DIAGNOSIS — L930 Discoid lupus erythematosus: Secondary | ICD-10-CM | POA: Diagnosis not present

## 2016-09-21 DIAGNOSIS — M130 Polyarthritis, unspecified: Secondary | ICD-10-CM | POA: Insufficient documentation

## 2016-09-21 DIAGNOSIS — R079 Chest pain, unspecified: Secondary | ICD-10-CM | POA: Diagnosis not present

## 2016-09-21 DIAGNOSIS — M25512 Pain in left shoulder: Secondary | ICD-10-CM | POA: Diagnosis not present

## 2016-09-21 LAB — BASIC METABOLIC PANEL
Anion gap: 10 (ref 5–15)
BUN: 8 mg/dL (ref 6–20)
CO2: 21 mmol/L — ABNORMAL LOW (ref 22–32)
Calcium: 7.8 mg/dL — ABNORMAL LOW (ref 8.9–10.3)
Chloride: 105 mmol/L (ref 101–111)
Creatinine, Ser: 0.67 mg/dL (ref 0.61–1.24)
GFR calc Af Amer: >60 mL/min (ref >60)
GFR calc non Af Amer: >60 mL/min (ref >60)
Glucose, Bld: 112 mg/dL — ABNORMAL HIGH (ref 65–99)
Potassium: 3.5 mmol/L (ref 3.5–5.1)
Sodium: 136 mmol/L (ref 135–145)

## 2016-09-21 LAB — CBC
HCT: 40.4 % (ref 39.0–52.0)
Hemoglobin: 14.5 g/dL (ref 13.0–17.0)
MCH: 31 pg (ref 26.0–34.0)
MCHC: 35.9 g/dL (ref 30.0–36.0)
MCV: 86.3 fL (ref 78.0–100.0)
Platelets: 214 10*3/uL (ref 150–400)
RBC: 4.68 MIL/uL (ref 4.22–5.81)
RDW: 12.7 % (ref 11.5–15.5)
WBC: 3.7 10*3/uL — ABNORMAL LOW (ref 4.0–10.5)

## 2016-09-21 LAB — I-STAT TROPONIN, ED: Troponin i, poc: 0 ng/mL (ref 0.00–0.08)

## 2016-09-21 LAB — SEDIMENTATION RATE: Sed Rate: 9 mm/hr (ref 0–16)

## 2016-09-21 MED ORDER — INDOMETHACIN 50 MG PO CAPS
50.0000 mg | ORAL_CAPSULE | Freq: Three times a day (TID) | ORAL | 0 refills | Status: DC
Start: 2016-09-21 — End: 2016-09-22

## 2016-09-21 MED ORDER — HYDROCODONE-ACETAMINOPHEN 5-325 MG PO TABS: 1.0000 | ORAL_TABLET | Freq: Four times a day (QID) | ORAL | 0 refills | Status: DC | PRN

## 2016-09-21 MED ORDER — MORPHINE SULFATE (PF) 2 MG/ML IV SOLN
6.0000 mg | Freq: Once | INTRAVENOUS | Status: AC
  Administered 2016-09-21: 6 mg via INTRAVENOUS
  Filled 2016-09-21: qty 3

## 2016-09-21 MED ORDER — OXYCODONE-ACETAMINOPHEN 5-325 MG PO TABS
1.0000 | ORAL_TABLET | Freq: Once | ORAL | Status: AC
  Administered 2016-09-21: 1 via ORAL
  Filled 2016-09-21: qty 1

## 2016-09-21 MED ORDER — KETOROLAC TROMETHAMINE 30 MG/ML IJ SOLN
30.0000 mg | Freq: Once | INTRAMUSCULAR | Status: AC
  Administered 2016-09-21: 30 mg via INTRAVENOUS
  Filled 2016-09-21: qty 1

## 2016-09-21 NOTE — ED Triage Notes (Signed)
Pt verbalizes constant left chest sharpness radiating down left arm causing awakening from sleep at 0300. Pt believes pain is related to lupus; similar episodes in the past.

## 2016-09-21 NOTE — ED Provider Notes (Signed)
[facility] DEPT Provider Note   CSN: [id number] Arrival date & time: 09/21/16  6213     History   Chief Complaint Chief Complaint  Patient presents with  . Chest Pain    HPI [name] is a 46 y.o. male.  The history is provided by the patient and medical records. No language interpreter was used.    [name] is a 46 y.o. male  with a PMH of lupus who presents to the Emergency Department complaining of aching sharp pain to central chest, bilateral wrists, knees, ankles and shoulders which began acutely at 3 AM this morning. Chest pain does not radiate. No better or worse with deep breathing. No alleviating or aggravating factors noted. No medications taken prior to arrival for symptoms. States this does feel similar to lupus flares, however pain does not typically come on so suddenly. No shortness of breath, back pain, abdominal pain, n/v/d, diaphoresis, jar pain. History of pleural effusion in the past, but states this does not feel anything similar. No fevers or chills. Followed by rheumatology, however clinic not open today.   Past Medical History:  Diagnosis Date  . Lupus     Patient Active Problem List   Diagnosis Date Noted  . Polyarthralgia 06/11/2015  . Pleural effusion   . S/P thoracentesis   . Lupus 03/10/2015  . Dyspnea 03/10/2015  . Lupus (systemic lupus erythematosus) ([place]) 03/09/2015    History reviewed. No pertinent surgical history.     Home Medications    Prior to Admission medications   Medication Sig Start Date End Date Taking? Authorizing Provider  ibuprofen (ADVIL,MOTRIN) 200 MG tablet Take 200 mg by mouth every 6 (six) hours as needed (pain).   Yes [provider]  acetaminophen (TYLENOL) 325 MG tablet Take 2 tablets (650 mg total) by mouth every 6 (six) hours as needed for mild pain (or Fever >/= 101). Patient not taking: Reported on 09/21/2016 06/14/15   Elgergawy, Silver Huguenin, MD  HYDROcodone-acetaminophen (NORCO/VICODIN)  5-325 MG tablet Take 1 tablet by mouth every 6 (six) hours as needed. 09/21/16   Reilley Latorre, Ozella Almond, PA-C  indomethacin (INDOCIN) 50 MG capsule Take 1 capsule (50 mg total) by mouth 3 (three) times daily with meals. 09/21/16   Atom Solivan, Ozella Almond, PA-C  oxyCODONE (OXY IR/ROXICODONE) 5 MG immediate release tablet Take 1 tablet (5 mg total) by mouth every 4 (four) hours as needed for moderate pain. Patient not taking: Reported on 09/21/2016 06/14/15   Elgergawy, Silver Huguenin, MD  pantoprazole (PROTONIX) 40 MG tablet Take 1 tablet (40 mg total) by mouth daily. Patient not taking: Reported on 09/21/2016 06/14/15   Elgergawy, Silver Huguenin, MD  predniSONE (DELTASONE) 10 MG tablet Please take 6 tablets for 3 days, then 5 tablets for 3 days, then 4 tablets for 3 days, then 2 tablets for 3 days, then 1 tablet for 3 days then stop. Patient not taking: Reported on 09/21/2016 06/14/15   Elgergawy, Silver Huguenin, MD    Family History Family History  Problem Relation Age of Onset  . Cancer Mother     Social History Social History  Substance Use Topics  . Smoking status: Never Smoker  . Smokeless tobacco: Not on file  . Alcohol use Yes     Allergies   Patient has no known allergies.   Review of Systems Review of Systems  Cardiovascular: Positive for chest pain. Negative for palpitations and leg swelling.  Musculoskeletal: Positive for arthralgias.  All other systems reviewed  and are negative.    Physical Exam Updated Vital Signs BP (!) 161/99   Pulse 81   Temp 98 F (36.7 C) (Oral)   Resp 17   Ht 5\' 11"  (1.803 m)   Wt 81.6 kg (180 lb)   SpO2 95%   BMI 25.10 kg/m   Physical Exam  Constitutional: He is oriented to person, place, and time. He appears well-developed and well-nourished. No distress.  HENT:  Head: Normocephalic and atraumatic.  Cardiovascular: Normal rate, regular rhythm and normal heart sounds.   No murmur heard. Pulmonary/Chest: Effort normal and breath sounds normal. No respiratory  distress. He has no wheezes. He has no rales. He exhibits no tenderness.  Abdominal: Soft. He exhibits no distension. There is no tenderness.  Musculoskeletal:  Full ROM of bilateral upper extremities. Mild diffuse tenderness to shoulder and wrists. No joint swelling appreciated. No erythema or warmth to any joints. 5/5 muscle strength.  Neurological: He is alert and oriented to person, place, and time.  Skin: Skin is warm and dry.  Nursing note and vitals reviewed.    ED Treatments / Results  Labs (all labs ordered are listed, but only abnormal results are displayed) Labs Reviewed  BASIC METABOLIC PANEL - Abnormal; Notable for the following:       Result Value   CO2 21 (*)    Glucose, Bld 112 (*)    Calcium 7.8 (*)    All other components within normal limits  CBC - Abnormal; Notable for the following:    WBC 3.7 (*)    All other components within normal limits  SEDIMENTATION RATE  I-STAT TROPOININ, ED    EKG  EKG Interpretation  Date/Time:  Monday Sep 21 2016 07:50:22 EDT Ventricular Rate:  95 PR Interval:    QRS Duration: 87 QT Interval:  351 QTC Calculation: 442 R Axis:   -2 Text Interpretation:  Sinus rhythm No significant change since last tracing Confirmed by Duffy Bruce [id number]) on 09/21/2016 9:15:36 AM       Radiology Dg Chest 2 View  Result Date: 09/21/2016 CLINICAL DATA:  Chest pain EXAM: CHEST  2 VIEW COMPARISON:  06/11/2015 FINDINGS: Cardiac shadow is within normal limits. Lungs are well aerated bilaterally. No focal infiltrate or sizable effusion is seen. Chronic pleural thickening is noted in the right base. IMPRESSION: No acute abnormality noted. Electronically Signed   By: Inez Catalina M.D.   On: 09/21/2016 08:19    Procedures Procedures (including critical care time)  Medications Ordered in ED Medications  morphine 2 MG/ML injection 6 mg (6 mg Intravenous Given 09/21/16 0851)  ketorolac (TORADOL) 30 MG/ML injection 30 mg (30 mg Intravenous  Given 09/21/16 0924)  oxyCODONE-acetaminophen (PERCOCET/ROXICET) 5-325 MG per tablet 1 tablet (1 tablet Oral Given 09/21/16 1052)     Initial Impression / Assessment and Plan / ED Course  I have reviewed the triage vital signs and the nursing notes.  Pertinent labs & imaging results that were available during my care of the patient were reviewed by me and considered in my medical decision making (see chart for details).    [name] is a 46 y.o. male who presents to ED for bilateral shoulder, wrist, knee and ankle pain as well as central chest pain. All described as feeling similar, sharp and throbbing. Hx of Lupus and feels similar to prior flares, however pain never occurred as suddenly as it did today. On exam, patient is afebrile, hemodynamically stable with normal cardiopulmonary examination. No joint  swelling/warmth/erythema. Pain controlled in ED. CXR negative. EKG reassuring with no change from prior tracing. Troponin negative. Sed rate wdl. CBC and BMP reassuring. Evaluation does not show pathology that would require ongoing emergent intervention or inpatient treatment. Will treat symptomatically with scheduled NSAIDS. Patient is followed by rheumatology and agrees to call first thing in the morning. We discussed at length reasons to return to the emergency department. Patient expressed understanding and agreement with plan as dictated above and all questions were answered.  Patient discussed with Dr. Ellender Hose who agrees with treatment plan.    Final Clinical Impressions(s) / ED Diagnoses   Final diagnoses:  Polyarthralgia  Lupus erythematosus, unspecified form    New Prescriptions Discharge Medication List as of 09/21/2016 11:14 AM    START taking these medications   Details  HYDROcodone-acetaminophen (NORCO/VICODIN) 5-325 MG tablet Take 1 tablet by mouth every 6 (six) hours as needed., Starting Mon 09/21/2016, Print    indomethacin (INDOCIN) 50 MG capsule Take 1 capsule (50  mg total) by mouth 3 (three) times daily with meals., Starting Mon 09/21/2016, Print         Rache Klimaszewski, Ozella Almond, PA-C 09/21/16 1214    Duffy Bruce, MD 09/22/16 [phone]

## 2016-09-21 NOTE — Discharge Instructions (Signed)
Take indomethacin as directed. For severe pain, I have given you a very short course of pain medication - This can make you very drowsy - please do not drink alcohol, operate heavy machinery or drive on this medication. It is very important that you call your rheumatologist tomorrow morning and discuss today's visit. Return to ER for fevers, joint redness or warmth, trouble breathing, new or worsening symptoms, any additional concerns.

## 2016-09-22 ENCOUNTER — Ambulatory Visit (INDEPENDENT_AMBULATORY_CARE_PROVIDER_SITE_OTHER): Payer: BLUE CROSS/BLUE SHIELD | Admitting: Physician Assistant

## 2016-09-22 ENCOUNTER — Encounter: Payer: Self-pay | Admitting: Physician Assistant

## 2016-09-22 VITALS — BP 149/90 | HR 79 | Temp 98.0°F | Resp 16 | Ht 71.0 in | Wt 184.4 lb

## 2016-09-22 DIAGNOSIS — L98499 Non-pressure chronic ulcer of skin of other sites with unspecified severity: Secondary | ICD-10-CM | POA: Diagnosis not present

## 2016-09-22 DIAGNOSIS — R21 Rash and other nonspecific skin eruption: Secondary | ICD-10-CM | POA: Diagnosis not present

## 2016-09-22 MED ORDER — TRIAMCINOLONE ACETONIDE 0.1 % EX CREA
1.0000 | TOPICAL_CREAM | Freq: Two times a day (BID) | CUTANEOUS | 0 refills | Status: DC
Start: 2016-09-22 — End: 2018-11-01

## 2016-09-22 MED ORDER — TRIAMCINOLONE ACETONIDE 0.025 % EX CREA
1.0000 | TOPICAL_CREAM | Freq: Two times a day (BID) | CUTANEOUS | 0 refills | Status: DC
Start: 2016-09-22 — End: 2018-11-01

## 2016-09-22 NOTE — Patient Instructions (Addendum)
WOUND CARE Please return in 7-10 days to have your stitches/staples removed or sooner if you have concerns. Marland Kitchen Keep area clean and dry for 24 hours. Do not remove bandage, if applied. . After 24 hours, remove bandage and wash wound gently with mild soap and warm water. Reapply a new bandage after cleaning wound, if directed. . Continue daily cleansing with soap and water until stitches/staples are removed. . Do not apply any ointments or creams to the wound while stitches/staples are in place, as this may cause delayed healing. . Notify the office if you experience any of the following signs of infection: Swelling, redness, pus drainage, streaking, fever >101.0 F . Notify the office if you experience excessive bleeding that does not stop after 15-20 minutes of constant, firm pressure.   Apply 0.1% cream to arms and trunk and 0.025% cream to face twice daily. I will contact you with pathology results. Please follow up with dermatology. They should call you in the next week to schedule appointment. If symptoms worsen in the meantime, seek care immediately. Thank you for letting me participate in your health and well being.    IF you received an x-ray today, you will receive an invoice from [facility]. Please contact [facility] at [phone] with questions or concerns regarding your invoice.   IF you received labwork today, you will receive an invoice from [facility]. Please contact [facility] at [phone] with questions or concerns regarding your invoice.   Our billing staff will not be able to assist you with questions regarding bills from these companies.  You will be contacted with the lab results as soon as they are available. The fastest way to get your results is to activate your My Chart account. Instructions are located on the last page of this paperwork. If you have not heard from [place] regarding the results in 2 weeks, please contact this office.

## 2016-09-22 NOTE — Addendum Note (Signed)
Addended by: Tenna Delaine D on: 09/22/2016 08:04 PM   Modules accepted: Level of Service

## 2016-09-22 NOTE — Progress Notes (Addendum)
[name]  MRN: [id number] DOB: [birth year]  Subjective:  [name] is a 45 y.o. male who presents for evaluation of a rash involving the face and upper extremity. Rash started several weeks  ago. Lesions are red, and blistering in texture but then appear as an erosion.  Rash has changed over time. Rash is mildy irritating. Associated symptoms: arthralgia. Patient denies: abdominal pain, congestion, cough, crankiness, decrease in appetite, decrease in energy level, fever, headache, irritability, myalgia, nausea, sore throat and vomiting. Patient has not had contacts with similar rash. Patient has had new exposures (soaps, lotions, laundry detergents, foods, medications, plants, insects or animals). Pt had similar rash last year which ended up causing hypopigmented scars on his arms. The rash appeared around the same time last year (April-May) so he thinks it has something to do with the heat. He has an appointment with dermatology but not until 10/2016. Has not tried anything for releif.   Has dx of lupus since 2006.Followed by rheumatologist, Dr. Lenna Gilford, closely. She examined the rash last year and did not think it is associated to lupus.   Review of Systems  Per HPI  Patient Active Problem List   Diagnosis Date Noted  . Polyarthralgia 06/11/2015  . Pleural effusion   . S/P thoracentesis   . Lupus 03/10/2015  . Dyspnea 03/10/2015  . Lupus (systemic lupus erythematosus) ([place]) 03/09/2015    Current Outpatient Prescriptions on File Prior to Visit  Medication Sig Dispense Refill  . HYDROcodone-acetaminophen (NORCO/VICODIN) 5-325 MG tablet Take 1 tablet by mouth every 6 (six) hours as needed. 8 tablet 0  . ibuprofen (ADVIL,MOTRIN) 200 MG tablet Take 200 mg by mouth every 6 (six) hours as needed (pain).    Marland Kitchen acetaminophen (TYLENOL) 325 MG tablet Take 2 tablets (650 mg total) by mouth every 6 (six) hours as needed for mild pain (or Fever >/= 101). (Patient not taking: Reported on  09/21/2016)     No current facility-administered medications on file prior to visit.     No Known Allergies     Social History   Social History  . Marital status: Single    Spouse name: N/A  . Number of children: N/A  . Years of education: N/A   Occupational History  . Not on file.   Social History Main Topics  . Smoking status: Never Smoker  . Smokeless tobacco: Never Used  . Alcohol use Yes  . Drug use: No  . Sexual activity: Not on file   Other Topics Concern  . Not on file   Social History Narrative  . No narrative on file    Objective:  BP (!) 149/90   Pulse 79   Temp 98 F (36.7 C) (Oral)   Resp 16   Ht 5\' 11"  (1.803 m)   Wt 184 lb 6.4 oz (83.6 kg)   SpO2 97%   BMI 25.72 kg/m   Physical Exam  Constitutional: He is oriented to person, place, and time and well-developed, well-nourished, and in no distress.  HENT:  Head: Normocephalic and atraumatic.  Eyes: Conjunctivae are normal.  Neck: Normal range of motion.  Pulmonary/Chest: Effort normal.  Neurological: He is alert and oriented to person, place, and time. Gait normal.  Skin: Skin is warm and dry.  Multiple new erosions with surrounding induration and mild erythema noted on bilateral upper extremities and bilateral cheeks of varying sizes. Older annular hypopigmented and hyperpigmented patches of varying sizes noted on bilateral upper extremities and posterior  trunk. No warmth or tenderness palpated. See images below.   Psychiatric: Affect normal.  Vitals reviewed.              PROCEDURE NOTE: Punch biopsy Verbal consent obtain from the patient.  Skin cleansed with alcohol pad, then local anesthesia with 3 cc 1% lidocaine.   A 61mm punch biopsy performed and specimen sent for pathology review. Biopsy site closed with #1 simple interrupted suture using 4-0 Ethilon. Bandage applied.  Local wound care reviewed.  Assessment and Plan :  1. Rash and nonspecific skin eruption Etiology  unclear. Could possibly be related to lupus as rash is in sun exposed areas. Punch biopsy obtained. Wound care instructions given. While awaiting results, will treat with topical steroids, given Rx for two different strengths (one for extremities/trunk and one for face). Follow up with dermatology. Return to clinic in 7-10 days for suture removal or sooner if symptoms worsen.  - Ambulatory referral to Dermatology - triamcinolone cream (KENALOG) 0.1 %; Apply 1 application topically 2 (two) times daily. To upper extremities and trunk. Do not apply to face or genital region.  Dispense: 30 g; Refill: 0 - triamcinolone (KENALOG) 0.025 % cream; Apply 1 application topically 2 (two) times daily. To the face.  Dispense: 30 g; Refill: 0 - Dermatology pathology  Tenna Delaine, PA-C  [facility] 09/22/2016 8:03 PM

## 2016-10-02 ENCOUNTER — Telehealth: Payer: Self-pay | Admitting: General Practice

## 2016-10-02 NOTE — Telephone Encounter (Signed)
Pt returned our call and wanted to let [place] know that he has not heard from the dermatologist and also the cream is not working   [employer] number [phone]

## 2016-10-02 NOTE — Telephone Encounter (Signed)
Can we check on status

## 2016-10-02 NOTE — Telephone Encounter (Signed)
Sent to [facility] on 6/8. [facility] phone number is [phone].

## 2016-10-03 ENCOUNTER — Encounter: Payer: Self-pay | Admitting: *Deleted

## 2016-10-03 NOTE — Telephone Encounter (Signed)
Can you please call patient and let him know that it was sent to [facility] on 6/8. Their phone number is [phone]. They should contact him but he can also contact them. Thank you.

## 2016-10-05 NOTE — Telephone Encounter (Signed)
Pt made aware Dermatology referral placed 6/8. Pt is requesting additional medicine for skin ulcer. Stated medication prescribed not working [facility] will re-evaluate

## 2016-10-06 NOTE — Telephone Encounter (Signed)
Thank you [name]. Please let him know he should just avoid picking at them and make sure they are staying clean and dry. He should stop using the steroid cream if it is not helping. If it gets infected he should come for reevaluation. Signs of infection are redness, warmth, purulent drainage and fever. Otherwise, he should follow up with derm.

## 2016-10-08 DIAGNOSIS — M329 Systemic lupus erythematosus, unspecified: Secondary | ICD-10-CM | POA: Diagnosis not present

## 2016-10-09 NOTE — Telephone Encounter (Signed)
Pt given message and has appointment with Dermatologist 7/26

## 2016-10-15 DIAGNOSIS — L989 Disorder of the skin and subcutaneous tissue, unspecified: Secondary | ICD-10-CM | POA: Diagnosis not present

## 2016-10-15 DIAGNOSIS — L905 Scar conditions and fibrosis of skin: Secondary | ICD-10-CM | POA: Diagnosis not present

## 2016-10-15 DIAGNOSIS — L739 Follicular disorder, unspecified: Secondary | ICD-10-CM | POA: Diagnosis not present

## 2016-10-23 DIAGNOSIS — J90 Pleural effusion, not elsewhere classified: Secondary | ICD-10-CM | POA: Diagnosis not present

## 2016-10-23 DIAGNOSIS — M064 Inflammatory polyarthropathy: Secondary | ICD-10-CM | POA: Diagnosis not present

## 2016-10-23 DIAGNOSIS — M329 Systemic lupus erythematosus, unspecified: Secondary | ICD-10-CM | POA: Diagnosis not present

## 2016-11-06 DIAGNOSIS — M329 Systemic lupus erythematosus, unspecified: Secondary | ICD-10-CM | POA: Diagnosis not present

## 2016-12-08 DIAGNOSIS — M329 Systemic lupus erythematosus, unspecified: Secondary | ICD-10-CM | POA: Diagnosis not present

## 2016-12-08 DIAGNOSIS — Z79899 Other long term (current) drug therapy: Secondary | ICD-10-CM | POA: Diagnosis not present

## 2016-12-10 DIAGNOSIS — M329 Systemic lupus erythematosus, unspecified: Secondary | ICD-10-CM | POA: Diagnosis not present

## 2016-12-10 DIAGNOSIS — R21 Rash and other nonspecific skin eruption: Secondary | ICD-10-CM | POA: Diagnosis not present

## 2017-01-04 DIAGNOSIS — R21 Rash and other nonspecific skin eruption: Secondary | ICD-10-CM | POA: Diagnosis not present

## 2017-01-05 DIAGNOSIS — M329 Systemic lupus erythematosus, unspecified: Secondary | ICD-10-CM | POA: Diagnosis not present

## 2017-01-19 DIAGNOSIS — R21 Rash and other nonspecific skin eruption: Secondary | ICD-10-CM | POA: Diagnosis not present

## 2017-01-26 DIAGNOSIS — M064 Inflammatory polyarthropathy: Secondary | ICD-10-CM | POA: Diagnosis not present

## 2017-01-26 DIAGNOSIS — J90 Pleural effusion, not elsewhere classified: Secondary | ICD-10-CM | POA: Diagnosis not present

## 2017-01-26 DIAGNOSIS — M329 Systemic lupus erythematosus, unspecified: Secondary | ICD-10-CM | POA: Diagnosis not present

## 2017-01-26 DIAGNOSIS — I313 Pericardial effusion (noninflammatory): Secondary | ICD-10-CM | POA: Diagnosis not present

## 2017-02-05 DIAGNOSIS — M329 Systemic lupus erythematosus, unspecified: Secondary | ICD-10-CM | POA: Diagnosis not present

## 2017-02-05 DIAGNOSIS — Z79899 Other long term (current) drug therapy: Secondary | ICD-10-CM | POA: Diagnosis not present

## 2017-03-08 DIAGNOSIS — M329 Systemic lupus erythematosus, unspecified: Secondary | ICD-10-CM | POA: Diagnosis not present

## 2017-03-29 DIAGNOSIS — M329 Systemic lupus erythematosus, unspecified: Secondary | ICD-10-CM | POA: Diagnosis not present

## 2017-03-29 DIAGNOSIS — J90 Pleural effusion, not elsewhere classified: Secondary | ICD-10-CM | POA: Diagnosis not present

## 2017-03-29 DIAGNOSIS — I313 Pericardial effusion (noninflammatory): Secondary | ICD-10-CM | POA: Diagnosis not present

## 2017-03-29 DIAGNOSIS — M064 Inflammatory polyarthropathy: Secondary | ICD-10-CM | POA: Diagnosis not present

## 2017-04-07 DIAGNOSIS — M329 Systemic lupus erythematosus, unspecified: Secondary | ICD-10-CM | POA: Diagnosis not present

## 2017-04-09 ENCOUNTER — Ambulatory Visit: Payer: Self-pay

## 2017-04-09 NOTE — Telephone Encounter (Signed)
Pt states he took 2 methotrexate for his Lupus and then later drank 2 glasses of wine. Pt states he felt "tingling" lips yesterday and awoke this am with upper and lower lip edema. He stated he took childrens liquid Benadryl and edema is unchanged. Advised pt to go to nearest urgent care asap.   Answer Assessment - Initial Assessment Questions 1. ONSET: "When did the swelling start?" (e.g., minutes, hours, days)  0830 this am  2. LOCATION: "What part of the face is swollen?"      Lips  3. SEVERITY: "How swollen is it?"     Very noticeable  4. ITCHING: "Is there any itching?" If so, ask: "How much?"   (Scale 1-10; mild, moderate or severe)     no 5. PAIN: "Is the swelling painful to touch?" If so, ask: "How painful is it?"   (Scale 1-10; mild, moderate or severe)     no 6. FEVER: "Do you have a fever?" If so, ask: "What is it, how was it measured, and when did it start?"      no 7. CAUSE: "What do you think is causing the face swelling?"     Wine that he  drank  8. RECURRENT SYMPTOM: "Have you had face swelling before?" If so, ask: "When was the last time?" "What happened that time?"     no 9. OTHER SYMPTOMS: "Do you have any other symptoms?" (e.g., toothache, leg swelling)     no 10. PREGNANCY: "Is there any chance you are pregnant?" "When was your last menstrual period?"       n/a  Protocols used: [facility]

## 2018-03-15 DIAGNOSIS — R21 Rash and other nonspecific skin eruption: Secondary | ICD-10-CM | POA: Diagnosis not present

## 2018-03-15 DIAGNOSIS — L718 Other rosacea: Secondary | ICD-10-CM | POA: Diagnosis not present

## 2018-05-17 DIAGNOSIS — L718 Other rosacea: Secondary | ICD-10-CM | POA: Diagnosis not present

## 2018-05-17 DIAGNOSIS — R21 Rash and other nonspecific skin eruption: Secondary | ICD-10-CM | POA: Diagnosis not present

## 2018-10-31 ENCOUNTER — Telehealth: Payer: Self-pay

## 2018-10-31 ENCOUNTER — Ambulatory Visit: Payer: BLUE CROSS/BLUE SHIELD | Admitting: Family Medicine

## 2018-10-31 NOTE — Telephone Encounter (Signed)

## 2018-11-01 ENCOUNTER — Other Ambulatory Visit: Payer: Self-pay

## 2018-11-01 ENCOUNTER — Encounter: Payer: Self-pay | Admitting: Family Medicine

## 2018-11-01 ENCOUNTER — Ambulatory Visit (INDEPENDENT_AMBULATORY_CARE_PROVIDER_SITE_OTHER): Payer: BC Managed Care – PPO

## 2018-11-01 ENCOUNTER — Ambulatory Visit (INDEPENDENT_AMBULATORY_CARE_PROVIDER_SITE_OTHER): Payer: BC Managed Care – PPO | Admitting: Family Medicine

## 2018-11-01 VITALS — BP 120/72 | HR 90 | Ht 71.0 in | Wt 163.5 lb

## 2018-11-01 DIAGNOSIS — L981 Factitial dermatitis: Secondary | ICD-10-CM

## 2018-11-01 DIAGNOSIS — Z0001 Encounter for general adult medical examination with abnormal findings: Secondary | ICD-10-CM | POA: Diagnosis not present

## 2018-11-01 DIAGNOSIS — F321 Major depressive disorder, single episode, moderate: Secondary | ICD-10-CM | POA: Diagnosis not present

## 2018-11-01 DIAGNOSIS — R7612 Nonspecific reaction to cell mediated immunity measurement of gamma interferon antigen response without active tuberculosis: Secondary | ICD-10-CM

## 2018-11-01 DIAGNOSIS — E559 Vitamin D deficiency, unspecified: Secondary | ICD-10-CM

## 2018-11-01 DIAGNOSIS — R21 Rash and other nonspecific skin eruption: Secondary | ICD-10-CM

## 2018-11-01 DIAGNOSIS — R0989 Other specified symptoms and signs involving the circulatory and respiratory systems: Secondary | ICD-10-CM | POA: Diagnosis not present

## 2018-11-01 DIAGNOSIS — M3213 Lung involvement in systemic lupus erythematosus: Secondary | ICD-10-CM

## 2018-11-01 DIAGNOSIS — Z23 Encounter for immunization: Secondary | ICD-10-CM | POA: Diagnosis not present

## 2018-11-01 DIAGNOSIS — Z8739 Personal history of other diseases of the musculoskeletal system and connective tissue: Secondary | ICD-10-CM | POA: Diagnosis not present

## 2018-11-01 DIAGNOSIS — D61818 Other pancytopenia: Secondary | ICD-10-CM

## 2018-11-01 DIAGNOSIS — Z0000 Encounter for general adult medical examination without abnormal findings: Secondary | ICD-10-CM | POA: Insufficient documentation

## 2018-11-01 LAB — CBC
HCT: 36 % — ABNORMAL LOW (ref 39.0–52.0)
Hemoglobin: 12.3 g/dL — ABNORMAL LOW (ref 13.0–17.0)
MCHC: 34.2 g/dL (ref 30.0–36.0)
MCV: 87 fl (ref 78.0–100.0)
Platelets: 118 10*3/uL — ABNORMAL LOW (ref 150.0–400.0)
RBC: 4.14 Mil/uL — ABNORMAL LOW (ref 4.22–5.81)
RDW: 13.4 % (ref 11.5–15.5)
WBC: 1.3 10*3/uL — CL (ref 4.0–10.5)

## 2018-11-01 LAB — COMPREHENSIVE METABOLIC PANEL
ALT: 27 U/L (ref 0–53)
AST: 52 U/L — ABNORMAL HIGH (ref 0–37)
Albumin: 2.2 g/dL — ABNORMAL LOW (ref 3.5–5.2)
Alkaline Phosphatase: 114 U/L (ref 39–117)
BUN: 20 mg/dL (ref 6–23)
CO2: 27 mEq/L (ref 19–32)
Calcium: 7.2 mg/dL — ABNORMAL LOW (ref 8.4–10.5)
Chloride: 99 mEq/L (ref 96–112)
Creatinine, Ser: 0.92 mg/dL (ref 0.40–1.50)
GFR: 87.4 mL/min (ref >60.00)
Glucose, Bld: 84 mg/dL (ref 70–99)
Potassium: 3.4 mEq/L — ABNORMAL LOW (ref 3.5–5.1)
Sodium: 132 mEq/L — ABNORMAL LOW (ref 135–145)
Total Bilirubin: 0.5 mg/dL (ref 0.2–1.2)
Total Protein: 7.4 g/dL (ref 6.0–8.3)

## 2018-11-01 LAB — LIPID PANEL
Cholesterol: 85 mg/dL (ref 0–200)
HDL: 16.2 mg/dL — ABNORMAL LOW (ref >39.00)
LDL Cholesterol: 44 mg/dL (ref 0–99)
NonHDL: 68.93
Total CHOL/HDL Ratio: 5
Triglycerides: 126 mg/dL (ref 0.0–149.0)
VLDL: 25.2 mg/dL (ref 0.0–40.0)

## 2018-11-01 LAB — VITAMIN D 25 HYDROXY (VIT D DEFICIENCY, FRACTURES): VITD: <7 ng/mL — ABNORMAL LOW (ref 30.00–100.00)

## 2018-11-01 LAB — LDL CHOLESTEROL, DIRECT: Direct LDL: 39 mg/dL

## 2018-11-01 MED ORDER — DULOXETINE HCL 30 MG PO CPEP: 30.0000 mg | ORAL_CAPSULE | Freq: Every day | ORAL | 1 refills | Status: DC

## 2018-11-01 MED ORDER — PREDNISONE 10 MG (48) PO TBPK: ORAL_TABLET | ORAL | 0 refills | Status: DC

## 2018-11-01 NOTE — Addendum Note (Signed)
Addended by: Lynnea Ferrier on: 11/01/2018 02:03 PM   Modules accepted: Orders

## 2018-11-01 NOTE — Progress Notes (Addendum)
Established Patient Office Visit  Subjective:  Patient ID: [name], male    DOB: 04/08/1971  Age: 48 y.o. MRN: [id number]  CC:  Chief Complaint  Patient presents with  . Annual Exam    HPI [name] presents for establishment of care accompanied by his concerned sister who is a Marine scientist.  He has a history of lupus erythematosus and chronic pain involving multiple joints.  He has been under rheumatoid otologist care in the past but discontinued that care when he did not feel like it was helping him.  He has a chronic rash on his face and lower extremities.  He is seeing dermatology for this in the past.  He does not have discoid lupus as far as he knows.  He says that the lupus has affected his lungs heart.  He lives alone and shares custody of his 41 year old son.  Patient is able to perform most of his ADLs.  His son helps him around the house with things that he cannot do himself.  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  He is currently working from home.  He does not smoke or use illicit drugs.  He rarely drinks alcohol.  He has used alcohol heavily in the past for control of his pain. He admits to picking at the rash on his face at times.  He says that the pain is affected him to the point to where his appetite is been diminished.  He has lost a significant amount of weight.  Past Medical History:  Diagnosis Date  . Lupus ([name])     History reviewed. No pertinent surgical history.  Family History  Problem Relation Age of Onset  . Cancer Mother     Social History   Socioeconomic History  . Marital status: Single    Spouse name: Not on file  . Number of children: Not on file  . Years of education: Not on file  . Highest education level: Not on file  Occupational History  . Not on file  Social Needs  . Financial resource strain: Not on file  . Food insecurity    Worry: Not on file    Inability: Not on file  . Transportation needs    Medical: Not on file   Non-medical: Not on file  Tobacco Use  . Smoking status: Never Smoker  . Smokeless tobacco: Never Used  Substance and Sexual Activity  . Alcohol use: Yes  . Drug use: No  . Sexual activity: Not on file  Lifestyle  . Physical activity    Days per week: Not on file    Minutes per session: Not on file  . Stress: Not on file  Relationships  . Social Herbalist on phone: Not on file    Gets together: Not on file    Attends religious service: Not on file    Active member of club or organization: Not on file    Attends meetings of clubs or organizations: Not on file    Relationship status: Not on file  . Intimate partner violence    Fear of current or ex partner: Not on file    Emotionally abused: Not on file    Physically abused: Not on file    Forced sexual activity: Not on file  Other Topics Concern  . Not on file  Social History Narrative  . Not on file    Outpatient Medications Prior to Visit  Medication Sig Dispense  Refill  . acetaminophen (TYLENOL) 325 MG tablet Take 2 tablets (650 mg total) by mouth every 6 (six) hours as needed for mild pain (or Fever >/= 101).    Marland Kitchen ibuprofen (ADVIL,MOTRIN) 200 MG tablet Take 200 mg by mouth every 6 (six) hours as needed (pain).    Marland Kitchen HYDROcodone-acetaminophen (NORCO/VICODIN) 5-325 MG tablet Take 1 tablet by mouth every 6 (six) hours as needed. 8 tablet 0  . triamcinolone (KENALOG) 0.025 % cream Apply 1 application topically 2 (two) times daily. To the face. 30 g 0  . triamcinolone cream (KENALOG) 0.1 % Apply 1 application topically 2 (two) times daily. To upper extremities and trunk. Do not apply to face or genital region. 30 g 0   No facility-administered medications prior to visit.     No Known Allergies  ROS Review of Systems  Constitutional: Positive for appetite change. Negative for chills, diaphoresis, fatigue, fever and unexpected weight change.  HENT: Negative.   Eyes: Negative for photophobia and visual  disturbance.  Respiratory: Negative.   Cardiovascular: Negative.   Gastrointestinal: Negative.   Endocrine: Negative for polyphagia and polyuria.  Genitourinary: Negative.   Musculoskeletal: Positive for arthralgias and joint swelling.  Skin: Positive for color change, rash and wound.  Allergic/Immunologic: Negative for immunocompromised state.  Neurological: Negative for seizures, light-headedness and numbness.  Hematological: Does not bruise/bleed easily.  Psychiatric/Behavioral: Positive for dysphoric mood and self-injury. The patient is nervous/anxious.        Depression screen [facility] 2/9 11/01/2018 09/22/2016 03/09/2015  Decreased Interest 3 0 0  Down, Depressed, Hopeless 2 0 0  PHQ - 2 Score 5 0 0  Altered sleeping 2 - -  Tired, decreased energy 3 - -  Change in appetite 3 - -  Feeling bad or failure about yourself  2 - -  Trouble concentrating 1 - -  Moving slowly or fidgety/restless 0 - -  Suicidal thoughts 0 - -  PHQ-9 Score 16 - -    Objective:    Physical Exam  Constitutional: He is oriented to person, place, and time. He appears well-developed and well-nourished. No distress.  HENT:  Head: Normocephalic and atraumatic.  Right Ear: External ear normal.  Left Ear: External ear normal.  Mouth/Throat: Oropharynx is clear and moist. No oropharyngeal exudate.  Eyes: Pupils are equal, round, and reactive to light. Conjunctivae are normal. Right eye exhibits no discharge. Left eye exhibits no discharge. No scleral icterus.  Neck: No JVD present. No tracheal deviation present. No thyromegaly present.  Cardiovascular: Normal rate, regular rhythm and normal heart sounds.  Pulmonary/Chest: Effort normal. No stridor. He has rhonchi in the right lower field and the left lower field.  Abdominal: Bowel sounds are normal.  Musculoskeletal:       Hands:  Lymphadenopathy:    He has no cervical adenopathy.  Neurological: He is alert and oriented to person, place, and time.  Skin:  He is not diaphoretic.     Psychiatric: He has a normal mood and affect. His behavior is normal.    BP 120/72   Pulse 90   Ht 5\' 11"  (1.803 m)   Wt 163 lb 8 oz (74.2 kg)   SpO2 100%   BMI 22.80 kg/m  Wt Readings from Last 3 Encounters:  11/01/18 163 lb 8 oz (74.2 kg)  09/22/16 184 lb 6.4 oz (83.6 kg)  09/21/16 180 lb (81.6 kg)   BP Readings from Last 3 Encounters:  11/01/18 120/72  09/22/16 (!) 149/90  09/21/16 Marland Kitchen)  161/99   Guideline developer:  UpToDate (see UpToDate for funding source) Date Released: June 2014  There are no preventive care reminders to display for this patient.  There are no preventive care reminders to display for this patient.  No results found for: TSH Lab Results  Component Value Date   WBC 1.3 Repeated and verified X2. (LL) 11/01/2018   HGB 12.3 (L) 11/01/2018   HCT 36.0 (L) 11/01/2018   MCV 87.0 11/01/2018   PLT 118.0 (L) 11/01/2018   Lab Results  Component Value Date   NA 132 (L) 11/01/2018   K 3.4 (L) 11/01/2018   CO2 27 11/01/2018   GLUCOSE 84 11/01/2018   BUN 20 11/01/2018   CREATININE 0.92 11/01/2018   BILITOT 0.5 11/01/2018   ALKPHOS 114 11/01/2018   AST 52 (H) 11/01/2018   ALT 27 11/01/2018   PROT 7.4 11/01/2018   ALBUMIN 2.2 (L) 11/01/2018   CALCIUM 7.2 (L) 11/01/2018   ANIONGAP 10 09/21/2016   GFR 87.40 11/01/2018   Lab Results  Component Value Date   CHOL 85 11/01/2018   Lab Results  Component Value Date   HDL 16.20 (L) 11/01/2018   Lab Results  Component Value Date   LDLCALC 44 11/01/2018   Lab Results  Component Value Date   TRIG 126.0 11/01/2018   Lab Results  Component Value Date   CHOLHDL 5 11/01/2018   No results found for: HGBA1C    Assessment & Plan:   Problem List Items Addressed This Visit      Musculoskeletal and Integument   Neurotic excoriations   Relevant Orders   Ambulatory referral to Psychiatry   Ambulatory referral to Pulmonology   Ambulatory referral to Cardiology    Ambulatory referral to Rheumatology     Other   Lupus (systemic lupus erythematosus) (HCC) - Primary   Relevant Medications   predniSONE (STERAPRED UNI-PAK 48 TAB) 10 MG (48) TBPK tablet   Other Relevant Orders   DG Chest 2 View (Completed)   Urinalysis, Routine w reflex microscopic (Completed)   Sedimentation rate (Completed)   Encounter for health maintenance examination with abnormal findings   Relevant Orders   CBC (Completed)   Comprehensive metabolic panel (Completed)   LDL cholesterol, direct (Completed)   Lipid panel (Completed)   HIV Antibody (routine testing w rflx) (Completed)   Hepatitis C antibody (Completed)   QuantiFERON-TB Gold Plus (Completed)   VITAMIN D 25 Hydroxy (Vit-D Deficiency, Fractures) (Completed)   Urinalysis, Routine w reflex microscopic (Completed)   Sedimentation rate (Completed)   MICROSCOPIC MESSAGE (Completed)   Depression, major, single episode, moderate (HCC)   Relevant Medications   DULoxetine (CYMBALTA) 30 MG capsule   Other Relevant Orders   Ambulatory referral to Psychiatry    Other Visit Diagnoses    Rash       Relevant Orders   Ambulatory referral to Dermatology   Need for Tdap vaccination       Relevant Orders   Tdap vaccine greater than or equal to 7yo IM (Completed)   Need for 23-polyvalent pneumococcal polysaccharide vaccine       Relevant Orders   Pneumococcal polysaccharide vaccine 23-valent greater than or equal to 2yo subcutaneous/IM (Completed)   Vitamin D deficiency       Relevant Medications   Vitamin D, Ergocalciferol, (DRISDOL) 1.25 MG (50000 UT) CAPS capsule   Pancytopenia (HCC)       Relevant Orders   Protein Electrophoresis, (serum)   False positive interferon-gamma release assay (IGRA)  for tuberculosis       Relevant Orders   QuantiFERON-TB Gold Plus      Meds ordered this encounter  Medications  . DULoxetine (CYMBALTA) 30 MG capsule    Sig: Take 1 capsule (30 mg total) by mouth daily. For one week and  then increase to 2 daily.    Dispense:  60 capsule    Refill:  1  . predniSONE (STERAPRED UNI-PAK 48 TAB) 10 MG (48) TBPK tablet    Sig: Pharm to instruct a 12 day dose pack.    Dispense:  48 tablet    Refill:  0  . Vitamin D, Ergocalciferol, (DRISDOL) 1.25 MG (50000 UT) CAPS capsule    Sig: Take 1 capsule (50,000 Units total) by mouth every 7 (seven) days.    Dispense:  5 capsule    Refill:  5    Follow-up: Return in about 1 month (around 12/02/2018).

## 2018-11-02 LAB — URINALYSIS, ROUTINE W REFLEX MICROSCOPIC
Bilirubin Urine: NEGATIVE
Glucose, UA: NEGATIVE
Hgb urine dipstick: NEGATIVE
Ketones, ur: NEGATIVE
Leukocytes,Ua: NEGATIVE
Nitrite: NEGATIVE
Specific Gravity, Urine: 1.019 (ref 1.001–1.03)
pH: 5.5 (ref 5.0–8.0)

## 2018-11-02 LAB — SEDIMENTATION RATE: Sed Rate: 55 mm/h — ABNORMAL HIGH (ref 0–15)

## 2018-11-03 ENCOUNTER — Other Ambulatory Visit: Payer: Self-pay

## 2018-11-03 DIAGNOSIS — Z0000 Encounter for general adult medical examination without abnormal findings: Secondary | ICD-10-CM

## 2018-11-03 LAB — QUANTIFERON-TB GOLD PLUS
Mitogen-NIL: 0.26 IU/mL
NIL: 0.02 IU/mL
QuantiFERON-TB Gold Plus: UNDETERMINED — AB
TB1-NIL: 0 IU/mL
TB2-NIL: 0 IU/mL

## 2018-11-03 LAB — HEPATITIS C ANTIBODY
Hepatitis C Ab: NONREACTIVE
SIGNAL TO CUT-OFF: 0.24 (ref <1.00)

## 2018-11-03 LAB — HIV ANTIBODY (ROUTINE TESTING W REFLEX): HIV 1&2 Ab, 4th Generation: NONREACTIVE

## 2018-11-03 MED ORDER — VITAMIN D (ERGOCALCIFEROL) 1.25 MG (50000 UNIT) PO CAPS: 50000.0000 [IU] | ORAL_CAPSULE | ORAL | 5 refills | Status: DC

## 2018-11-03 NOTE — Addendum Note (Signed)
Addended by: Abelino Derrick A on: 11/03/2018 11:26 AM   Modules accepted: Orders

## 2018-11-07 DIAGNOSIS — L988 Other specified disorders of the skin and subcutaneous tissue: Secondary | ICD-10-CM | POA: Diagnosis not present

## 2018-11-07 NOTE — Addendum Note (Signed)
Addended by: Jon Billings on: 11/07/2018 10:39 AM   Modules accepted: Orders

## 2018-11-09 ENCOUNTER — Encounter: Payer: Self-pay | Admitting: Family Medicine

## 2018-11-10 ENCOUNTER — Other Ambulatory Visit: Payer: BC Managed Care – PPO

## 2018-11-10 ENCOUNTER — Other Ambulatory Visit: Payer: Self-pay

## 2018-11-10 DIAGNOSIS — R7612 Nonspecific reaction to cell mediated immunity measurement of gamma interferon antigen response without active tuberculosis: Secondary | ICD-10-CM

## 2018-11-10 DIAGNOSIS — D61818 Other pancytopenia: Secondary | ICD-10-CM | POA: Diagnosis not present

## 2018-11-10 DIAGNOSIS — Z0000 Encounter for general adult medical examination without abnormal findings: Secondary | ICD-10-CM | POA: Diagnosis not present

## 2018-11-10 NOTE — Addendum Note (Signed)
Addended by: Rodrigo Ran on: 11/10/2018 08:54 AM   Modules accepted: Orders

## 2018-11-14 LAB — PROTEIN ELECTROPHORESIS, SERUM
Albumin ELP: 2.5 g/dL — ABNORMAL LOW (ref 3.8–4.8)
Alpha 1: 0.2 g/dL (ref 0.2–0.3)
Alpha 2: 0.5 g/dL (ref 0.5–0.9)
Beta 2: 0.4 g/dL (ref 0.2–0.5)
Beta Globulin: 0.3 g/dL — ABNORMAL LOW (ref 0.4–0.6)
Gamma Globulin: 2.9 g/dL — ABNORMAL HIGH (ref 0.8–1.7)
Total Protein: 6.8 g/dL (ref 6.1–8.1)

## 2018-11-14 LAB — QUANTIFERON-TB GOLD PLUS
Mitogen-NIL: 0.27 IU/mL
NIL: 0.01 IU/mL
QuantiFERON-TB Gold Plus: UNDETERMINED — AB
TB1-NIL: 0 IU/mL
TB2-NIL: 0 IU/mL

## 2018-11-14 LAB — RPR: RPR Ser Ql: NONREACTIVE

## 2018-11-15 NOTE — Progress Notes (Signed)
IGRA test for T.B. was indeterminate twice. We need to do a skin test. Have ordered. Will discuss Hgb electrophoresis results at next visit.

## 2018-11-22 DIAGNOSIS — M329 Systemic lupus erythematosus, unspecified: Secondary | ICD-10-CM | POA: Diagnosis not present

## 2018-11-22 DIAGNOSIS — M79641 Pain in right hand: Secondary | ICD-10-CM | POA: Diagnosis not present

## 2018-11-22 DIAGNOSIS — D72819 Decreased white blood cell count, unspecified: Secondary | ICD-10-CM | POA: Diagnosis not present

## 2018-11-22 DIAGNOSIS — Z13828 Encounter for screening for other musculoskeletal disorder: Secondary | ICD-10-CM | POA: Diagnosis not present

## 2018-11-22 DIAGNOSIS — M79642 Pain in left hand: Secondary | ICD-10-CM | POA: Diagnosis not present

## 2018-11-22 DIAGNOSIS — M1990 Unspecified osteoarthritis, unspecified site: Secondary | ICD-10-CM | POA: Diagnosis not present

## 2018-11-22 DIAGNOSIS — D696 Thrombocytopenia, unspecified: Secondary | ICD-10-CM | POA: Diagnosis not present

## 2018-11-22 DIAGNOSIS — M79671 Pain in right foot: Secondary | ICD-10-CM | POA: Diagnosis not present

## 2018-12-05 ENCOUNTER — Ambulatory Visit (INDEPENDENT_AMBULATORY_CARE_PROVIDER_SITE_OTHER): Payer: BC Managed Care – PPO | Admitting: Family Medicine

## 2018-12-05 ENCOUNTER — Encounter: Payer: Self-pay | Admitting: Family Medicine

## 2018-12-05 VITALS — BP 126/80 | HR 73 | Ht 71.0 in | Wt 161.2 lb

## 2018-12-05 DIAGNOSIS — M3213 Lung involvement in systemic lupus erythematosus: Secondary | ICD-10-CM | POA: Diagnosis not present

## 2018-12-05 DIAGNOSIS — E559 Vitamin D deficiency, unspecified: Secondary | ICD-10-CM | POA: Diagnosis not present

## 2018-12-05 DIAGNOSIS — F321 Major depressive disorder, single episode, moderate: Secondary | ICD-10-CM

## 2018-12-05 DIAGNOSIS — R7612 Nonspecific reaction to cell mediated immunity measurement of gamma interferon antigen response without active tuberculosis: Secondary | ICD-10-CM

## 2018-12-05 NOTE — Progress Notes (Signed)
Established Patient Office Visit  Subjective:  Patient ID: [name], male    DOB: 1971-03-04  Age: 48 y.o. MRN: [id number]  CC:  Chief Complaint  Patient presents with  . Follow-up    HPI [name] presents for follow-up of his depression and vitamin D deficiency.  Patient is seeing rheumatology and his apparent lupus seems to be better controlled with decreased joint aches and swelling.  Skin lesions are clearing up with the addition of doxycycline.  Plaquenil has been added and he continues taking 20 mg of prednisone daily.  He has not started prescribed methotrexate.  Also here for follow-up of igra x2.  No recent history of hemoptysis cough.  Past Medical History:  Diagnosis Date  . Lupus ([place])     History reviewed. No pertinent surgical history.  Family History  Problem Relation Age of Onset  . Cancer Mother     Social History   Socioeconomic History  . Marital status: Single    Spouse name: Not on file  . Number of children: Not on file  . Years of education: Not on file  . Highest education level: Not on file  Occupational History  . Not on file  Social Needs  . Financial resource strain: Not on file  . Food insecurity    Worry: Not on file    Inability: Not on file  . Transportation needs    Medical: Not on file    Non-medical: Not on file  Tobacco Use  . Smoking status: Never Smoker  . Smokeless tobacco: Never Used  Substance and Sexual Activity  . Alcohol use: Yes  . Drug use: No  . Sexual activity: Not on file  Lifestyle  . Physical activity    Days per week: Not on file    Minutes per session: Not on file  . Stress: Not on file  Relationships  . Social Herbalist on phone: Not on file    Gets together: Not on file    Attends religious service: Not on file    Active member of club or organization: Not on file    Attends meetings of clubs or organizations: Not on file    Relationship status: Not on file  . Intimate  partner violence    Fear of current or ex partner: Not on file    Emotionally abused: Not on file    Physically abused: Not on file    Forced sexual activity: Not on file  Other Topics Concern  . Not on file  Social History Narrative  . Not on file    Outpatient Medications Prior to Visit  Medication Sig Dispense Refill  . acetaminophen (TYLENOL) 325 MG tablet Take 2 tablets (650 mg total) by mouth every 6 (six) hours as needed for mild pain (or Fever >/= 101).    Marland Kitchen doxycycline (VIBRA-TABS) 100 MG tablet Take 100 mg by mouth 2 (two) times daily.    . DULoxetine (CYMBALTA) 30 MG capsule Take 1 capsule (30 mg total) by mouth daily. For one week and then increase to 2 daily. 60 capsule 1  . ibuprofen (ADVIL,MOTRIN) 200 MG tablet Take 200 mg by mouth every 6 (six) hours as needed (pain).    . predniSONE (DELTASONE) 20 MG tablet Take 20 mg by mouth daily with breakfast.    . Vitamin D, Ergocalciferol, (DRISDOL) 1.25 MG (50000 UT) CAPS capsule Take 1 capsule (50,000 Units total) by mouth every 7 (seven) days.  5 capsule 5  . predniSONE (STERAPRED UNI-PAK 48 TAB) 10 MG (48) TBPK tablet Pharm to instruct a 12 day dose pack. 48 tablet 0  . hydroxychloroquine (PLAQUENIL) 200 MG tablet Take 200 mg by mouth daily.     No facility-administered medications prior to visit.     No Known Allergies  ROS Review of Systems  Constitutional: Negative.   HENT: Negative.   Respiratory: Negative for cough and chest tightness.   Cardiovascular: Negative for chest pain and palpitations.  Gastrointestinal: Negative.   Endocrine: Negative for polyphagia and polyuria.  Allergic/Immunologic: Positive for immunocompromised state.   Depression screen [facility] 2/9 12/05/2018 11/01/2018 09/22/2016  Decreased Interest 3 3 0  Down, Depressed, Hopeless 1 2 0  PHQ - 2 Score 4 5 0  Altered sleeping 1 2 -  Tired, decreased energy 1 3 -  Change in appetite 2 3 -  Feeling bad or failure about yourself  1 2 -  Trouble  concentrating 0 1 -  Moving slowly or fidgety/restless 0 0 -  Suicidal thoughts 0 0 -  PHQ-9 Score 9 16 -      Objective:    Physical Exam  Constitutional: He is oriented to person, place, and time. He appears well-developed. No distress.  HENT:  Head: Normocephalic and atraumatic.  Right Ear: External ear normal.  Left Ear: External ear normal.  Eyes: Right eye exhibits no discharge. Left eye exhibits no discharge. No scleral icterus.  Neck: No JVD present. No tracheal deviation present.  Pulmonary/Chest: Effort normal. No stridor.  Neurological: He is alert and oriented to person, place, and time.  Skin: Skin is warm and dry. He is not diaphoretic.  Psychiatric: He has a normal mood and affect. His behavior is normal.    BP 126/80   Pulse 73   Ht 5\' 11"  (1.803 m)   Wt 161 lb 4 oz (73.1 kg)   SpO2 97%   BMI 22.49 kg/m  Wt Readings from Last 3 Encounters:  12/05/18 161 lb 4 oz (73.1 kg)  11/01/18 163 lb 8 oz (74.2 kg)  09/22/16 184 lb 6.4 oz (83.6 kg)   BP Readings from Last 3 Encounters:  12/05/18 126/80  11/01/18 120/72  09/22/16 (!) 149/90   Guideline developer:  UpToDate (see UpToDate for funding source) Date Released: June 2014  Health Maintenance Due  Topic Date Due  . INFLUENZA VACCINE  11/26/2018    There are no preventive care reminders to display for this patient.  No results found for: TSH Lab Results  Component Value Date   WBC 1.3 Repeated and verified X2. (LL) 11/01/2018   HGB 12.3 (L) 11/01/2018   HCT 36.0 (L) 11/01/2018   MCV 87.0 11/01/2018   PLT 118.0 (L) 11/01/2018   Lab Results  Component Value Date   NA 132 (L) 11/01/2018   K 3.4 (L) 11/01/2018   CO2 27 11/01/2018   GLUCOSE 84 11/01/2018   BUN 20 11/01/2018   CREATININE 0.92 11/01/2018   BILITOT 0.5 11/01/2018   ALKPHOS 114 11/01/2018   AST 52 (H) 11/01/2018   ALT 27 11/01/2018   PROT 6.8 11/10/2018   ALBUMIN 2.2 (L) 11/01/2018   CALCIUM 7.2 (L) 11/01/2018   ANIONGAP 10  09/21/2016   GFR 87.40 11/01/2018   Lab Results  Component Value Date   CHOL 85 11/01/2018   Lab Results  Component Value Date   HDL 16.20 (L) 11/01/2018   Lab Results  Component Value Date   LDLCALC  44 11/01/2018   Lab Results  Component Value Date   TRIG 126.0 11/01/2018   Lab Results  Component Value Date   CHOLHDL 5 11/01/2018   No results found for: HGBA1C    Assessment & Plan:   Problem List Items Addressed This Visit      Respiratory   Systemic lupus erythematosus with lung involvement ([place]) - Primary     Other   Depression, major, single episode, moderate ([place])   Vitamin D deficiency   False positive interferon-gamma release assay (IGRA) for tuberculosis   Relevant Orders   PPD   Ambulatory referral to Infectious Disease      No orders of the defined types were placed in this encounter.   Follow-up: Return in about 2 months (around 02/04/2019).   Discussed his elevated gammaglobulin fraction and that it was probably due to his lupus.  We will go ahead and place a PPD today even though it may be a false negative.  Seeking ID opinion.  Continue high-dose vitamin D and Cymbalta.  Follow-up in 2 months.  PHQ score is lower today.

## 2018-12-06 ENCOUNTER — Telehealth: Payer: Self-pay | Admitting: Behavioral Health

## 2018-12-06 NOTE — Telephone Encounter (Signed)

## 2018-12-07 ENCOUNTER — Ambulatory Visit: Payer: BC Managed Care – PPO

## 2018-12-09 ENCOUNTER — Ambulatory Visit: Payer: BC Managed Care – PPO | Admitting: Family Medicine

## 2018-12-13 DIAGNOSIS — D696 Thrombocytopenia, unspecified: Secondary | ICD-10-CM | POA: Diagnosis not present

## 2018-12-13 DIAGNOSIS — M1990 Unspecified osteoarthritis, unspecified site: Secondary | ICD-10-CM | POA: Diagnosis not present

## 2018-12-13 DIAGNOSIS — D72819 Decreased white blood cell count, unspecified: Secondary | ICD-10-CM | POA: Diagnosis not present

## 2018-12-13 DIAGNOSIS — M329 Systemic lupus erythematosus, unspecified: Secondary | ICD-10-CM | POA: Diagnosis not present

## 2019-01-04 DIAGNOSIS — Z79899 Other long term (current) drug therapy: Secondary | ICD-10-CM | POA: Diagnosis not present

## 2019-01-05 ENCOUNTER — Other Ambulatory Visit: Payer: Self-pay | Admitting: Family Medicine

## 2019-01-05 DIAGNOSIS — F321 Major depressive disorder, single episode, moderate: Secondary | ICD-10-CM

## 2019-02-06 ENCOUNTER — Encounter: Payer: Self-pay | Admitting: Family Medicine

## 2019-02-06 ENCOUNTER — Ambulatory Visit (INDEPENDENT_AMBULATORY_CARE_PROVIDER_SITE_OTHER): Payer: BC Managed Care – PPO | Admitting: Family Medicine

## 2019-02-06 ENCOUNTER — Other Ambulatory Visit: Payer: Self-pay

## 2019-02-06 VITALS — BP 120/70 | HR 78 | Ht 71.0 in | Wt 170.0 lb

## 2019-02-06 DIAGNOSIS — L981 Factitial dermatitis: Secondary | ICD-10-CM

## 2019-02-06 DIAGNOSIS — R7989 Other specified abnormal findings of blood chemistry: Secondary | ICD-10-CM | POA: Diagnosis not present

## 2019-02-06 DIAGNOSIS — F1090 Alcohol use, unspecified, uncomplicated: Secondary | ICD-10-CM | POA: Insufficient documentation

## 2019-02-06 DIAGNOSIS — F321 Major depressive disorder, single episode, moderate: Secondary | ICD-10-CM

## 2019-02-06 DIAGNOSIS — E559 Vitamin D deficiency, unspecified: Secondary | ICD-10-CM

## 2019-02-06 DIAGNOSIS — D61818 Other pancytopenia: Secondary | ICD-10-CM | POA: Insufficient documentation

## 2019-02-06 DIAGNOSIS — R7309 Other abnormal glucose: Secondary | ICD-10-CM

## 2019-02-06 DIAGNOSIS — Z789 Other specified health status: Secondary | ICD-10-CM | POA: Insufficient documentation

## 2019-02-06 DIAGNOSIS — Z7289 Other problems related to lifestyle: Secondary | ICD-10-CM

## 2019-02-06 LAB — COMPREHENSIVE METABOLIC PANEL
ALT: 14 U/L (ref 0–53)
AST: 28 U/L (ref 0–37)
Albumin: 2.3 g/dL — ABNORMAL LOW (ref 3.5–5.2)
Alkaline Phosphatase: 103 U/L (ref 39–117)
BUN: 7 mg/dL (ref 6–23)
CO2: 29 mEq/L (ref 19–32)
Calcium: 7.4 mg/dL — ABNORMAL LOW (ref 8.4–10.5)
Chloride: 103 mEq/L (ref 96–112)
Creatinine, Ser: 0.74 mg/dL (ref 0.40–1.50)
GFR: 112.24 mL/min (ref >60.00)
Glucose, Bld: 136 mg/dL — ABNORMAL HIGH (ref 70–99)
Potassium: 3.7 mEq/L (ref 3.5–5.1)
Sodium: 137 mEq/L (ref 135–145)
Total Bilirubin: 0.3 mg/dL (ref 0.2–1.2)
Total Protein: 6.1 g/dL (ref 6.0–8.3)

## 2019-02-06 LAB — CBC
HCT: 37.2 % — ABNORMAL LOW (ref 39.0–52.0)
Hemoglobin: 12.7 g/dL — ABNORMAL LOW (ref 13.0–17.0)
MCHC: 34.1 g/dL (ref 30.0–36.0)
MCV: 94 fl (ref 78.0–100.0)
Platelets: 184 10*3/uL (ref 150.0–400.0)
RBC: 3.95 Mil/uL — ABNORMAL LOW (ref 4.22–5.81)
RDW: 14 % (ref 11.5–15.5)
WBC: 1.6 10*3/uL — CL (ref 4.0–10.5)

## 2019-02-06 LAB — TB SKIN TEST
Induration: 0 mm
TB Skin Test: NEGATIVE

## 2019-02-06 LAB — VITAMIN D 25 HYDROXY (VIT D DEFICIENCY, FRACTURES): VITD: 18.78 ng/mL — ABNORMAL LOW (ref 30.00–100.00)

## 2019-02-06 LAB — TSH: TSH: 6.32 u[IU]/mL — ABNORMAL HIGH (ref 0.35–4.50)

## 2019-02-06 MED ORDER — DULOXETINE HCL 30 MG PO CPEP: 60.0000 mg | ORAL_CAPSULE | Freq: Every day | ORAL | 2 refills | Status: DC

## 2019-02-06 NOTE — Progress Notes (Addendum)
Established Patient Office Visit  Subjective:  Patient ID: [name], male    DOB: 08/07/1970  Age: 48 y.o. MRN: [id number]  CC:  Chief Complaint  Patient presents with  . Follow-up    HPI CORNELLIUS Pennington presents for follow-up of his depression, pancytopenia, vitamin D deficiency, elevated LFTs and neurotic excoriations.  Depression is better with the Cymbalta.  Believes the medication is helping as well as is decreased pain with his treated lupus.  He is now taking a DMARD for his lupus but is not sure of the name of it.  He says that his PPD test was negative he could not find the mark where he had been injected.  Continues to take his vitamin D.  He does not smoke or use illicit drugs.  He is drinking 2 to 4 glasses of wine and a given setting 2-3 times weekly.  He says that his sister is not been concerned about his drinking as far as he knows.  She is not present today.  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  He works from home.  Past Medical History:  Diagnosis Date  . Lupus ([place])     History reviewed. No pertinent surgical history.  Family History  Problem Relation Age of Onset  . Cancer Mother     Social History   Socioeconomic History  . Marital status: Single    Spouse name: Not on file  . Number of children: Not on file  . Years of education: Not on file  . Highest education level: Not on file  Occupational History  . Not on file  Social Needs  . Financial resource strain: Not on file  . Food insecurity    Worry: Not on file    Inability: Not on file  . Transportation needs    Medical: Not on file    Non-medical: Not on file  Tobacco Use  . Smoking status: Never Smoker  . Smokeless tobacco: Never Used  Substance and Sexual Activity  . Alcohol use: Yes    Comment: 2-4 glasses of wine 2-3 times weekly  . Drug use: No  . Sexual activity: Not on file  Lifestyle  . Physical activity    Days per week: Not on file    Minutes per session: Not on file  . Stress: Not on file  Relationships  . Social Herbalist on phone: Not on file    Gets together: Not on file    Attends religious service: Not on file    Active member of club or organization: Not on file    Attends meetings of clubs or organizations: Not on file    Relationship status: Not on file  . Intimate partner violence    Fear of current or ex partner: Not on file    Emotionally abused: Not on file    Physically abused: Not on file    Forced sexual activity: Not on file  Other Topics Concern  . Not on file  Social History Narrative  . Not on file    Outpatient Medications Prior to Visit  Medication Sig Dispense Refill  . acetaminophen (TYLENOL) 325 MG tablet Take 2 tablets (650 mg total) by mouth every 6 (six) hours as needed for mild pain (or Fever >/= 101).    Marland Kitchen doxycycline (VIBRA-TABS) 100 MG tablet Take 100 mg by mouth 2 (two) times daily.    . hydroxychloroquine (  PLAQUENIL) 200 MG tablet Take 200 mg by mouth daily.    Marland Kitchen ibuprofen (ADVIL,MOTRIN) 200 MG tablet Take 200 mg by mouth every 6 (six) hours as needed (pain).    . predniSONE (DELTASONE) 20 MG tablet Take 20 mg by mouth daily with breakfast.    . Vitamin D, Ergocalciferol, (DRISDOL) 1.25 MG (50000 UT) CAPS capsule Take 1 capsule (50,000 Units total) by mouth every 7 (seven) days. 5 capsule 5  . DULoxetine (CYMBALTA) 30 MG capsule TAKE 1 CAPSULE BY MOUTH ONCE DAILY FOR  1  WEEK  AND  THEN  INCREASE  TO  2  ONCE  DAILY 60 capsule 0   No facility-administered medications prior to visit.     No Known Allergies  ROS Review of Systems  Constitutional: Negative.   HENT: Negative.   Eyes: Negative for photophobia and visual disturbance.  Respiratory: Negative.   Cardiovascular: Negative.   Gastrointestinal: Negative.   Endocrine: Negative for polyphagia and polyuria.  Musculoskeletal: Negative for gait problem.  Skin: Positive for rash and wound.   Allergic/Immunologic: Negative for immunocompromised state.  Neurological: Negative for speech difficulty.  Hematological: Does not bruise/bleed easily.  Psychiatric/Behavioral: Negative.    PHQ9 SCORE ONLY 02/06/2019 12/05/2018 11/01/2018  Score 7 9 16    Depression screen [facility] 2/9 02/06/2019 12/05/2018 11/01/2018  Decreased Interest 3 3 3   Down, Depressed, Hopeless 0 1 2  PHQ - 2 Score 3 4 5   Altered sleeping 1 1 2   Tired, decreased energy 1 1 3   Change in appetite 1 2 3   Feeling bad or failure about yourself  0 1 2  Trouble concentrating 1 0 1  Moving slowly or fidgety/restless 0 0 0  Suicidal thoughts 0 0 0  PHQ-9 Score 7 9 16       Objective:    Physical Exam  Constitutional: He is oriented to person, place, and time. He appears well-developed and well-nourished. No distress.  HENT:  Head: Normocephalic and atraumatic.  Right Ear: External ear normal.  Left Ear: External ear normal.  Eyes: Conjunctivae are normal. Right eye exhibits no discharge. Left eye exhibits no discharge. No scleral icterus.  Neck: Neck supple. No JVD present. No tracheal deviation present. No thyromegaly present.  Cardiovascular: Normal rate, regular rhythm and normal heart sounds.  Pulmonary/Chest: Effort normal and breath sounds normal.  Lymphadenopathy:    He has no cervical adenopathy.  Neurological: He is alert and oriented to person, place, and time.  Skin: Skin is warm and dry. He is not diaphoretic.     Psychiatric: He has a normal mood and affect. His behavior is normal.    BP 120/70   Pulse 78   Ht 5\' 11"  (1.803 m)   Wt 170 lb (77.1 kg)   SpO2 100%   BMI 23.71 kg/m  Wt Readings from Last 3 Encounters:  02/06/19 170 lb (77.1 kg)  12/05/18 161 lb 4 oz (73.1 kg)  11/01/18 163 lb 8 oz (74.2 kg)   BP Readings from Last 3 Encounters:  02/06/19 120/70  12/05/18 126/80  11/01/18 120/72   Guideline developer:  UpToDate (see UpToDate for funding source) Date Released: June 2014   There are no preventive care reminders to display for this patient.  There are no preventive care reminders to display for this patient.  Lab Results  Component Value Date   TSH 6.32 (H) 02/06/2019   Lab Results  Component Value Date   WBC 1.6 Repeated and verified X2. (LL) 02/06/2019  HGB 12.7 (L) 02/06/2019   HCT 37.2 (L) 02/06/2019   MCV 94.0 02/06/2019   PLT 184.0 02/06/2019   Lab Results  Component Value Date   NA 137 02/06/2019   K 3.7 02/06/2019   CO2 29 02/06/2019   GLUCOSE 136 (H) 02/06/2019   BUN 7 02/06/2019   CREATININE 0.74 02/06/2019   BILITOT 0.3 02/06/2019   ALKPHOS 103 02/06/2019   AST 28 02/06/2019   ALT 14 02/06/2019   PROT 6.1 02/06/2019   ALBUMIN 2.3 (L) 02/06/2019   CALCIUM 7.4 (L) 02/06/2019   ANIONGAP 10 09/21/2016   GFR 112.24 02/06/2019   Lab Results  Component Value Date   CHOL 85 11/01/2018   Lab Results  Component Value Date   HDL 16.20 (L) 11/01/2018   Lab Results  Component Value Date   LDLCALC 44 11/01/2018   Lab Results  Component Value Date   TRIG 126.0 11/01/2018   Lab Results  Component Value Date   CHOLHDL 5 11/01/2018   No results found for: HGBA1C    Assessment & Plan:   Problem List Items Addressed This Visit      Musculoskeletal and Integument   Neurotic excoriations     Hematopoietic and Hemostatic   Pancytopenia (HCC)   Relevant Orders   CBC (Completed)   TSH (Completed)   Ambulatory referral to Hematology     Other   Depression, major, single episode, moderate (HCC) - Primary   Relevant Medications   DULoxetine (CYMBALTA) 30 MG capsule   Vitamin D deficiency   Relevant Orders   VITAMIN D 25 Hydroxy (Vit-D Deficiency, Fractures) (Completed)   Alcohol use   Elevated LFTs   Relevant Orders   Comprehensive metabolic panel (Completed)    Other Visit Diagnoses    Abnormal TSH       Relevant Orders   TSH   Elevated glucose       Relevant Orders   Hemoglobin A1c      Meds ordered this  encounter  Medications  . DULoxetine (CYMBALTA) 30 MG capsule    Sig: Take 2 capsules (60 mg total) by mouth daily.    Dispense:  60 capsule    Refill:  2    Follow-up: Return in about 3 months (around 05/09/2019).   Discussed his abnormal CBC in the hemoglobin electrophoresis.  Hopefully these were due to his untreated lupus.  Repeating the CBC today.  Vitamin D level drawn today.  We will continue to follow his alcohol use and depression.

## 2019-02-08 DIAGNOSIS — L988 Other specified disorders of the skin and subcutaneous tissue: Secondary | ICD-10-CM | POA: Diagnosis not present

## 2019-02-10 DIAGNOSIS — M329 Systemic lupus erythematosus, unspecified: Secondary | ICD-10-CM | POA: Diagnosis not present

## 2019-02-10 DIAGNOSIS — D696 Thrombocytopenia, unspecified: Secondary | ICD-10-CM | POA: Diagnosis not present

## 2019-02-10 DIAGNOSIS — M1990 Unspecified osteoarthritis, unspecified site: Secondary | ICD-10-CM | POA: Diagnosis not present

## 2019-02-10 DIAGNOSIS — D72819 Decreased white blood cell count, unspecified: Secondary | ICD-10-CM | POA: Diagnosis not present

## 2019-02-13 NOTE — Addendum Note (Signed)
Addended by: Jon Billings on: 02/13/2019 12:51 PM   Modules accepted: Orders

## 2019-02-13 NOTE — Addendum Note (Signed)
Addended by: Jon Billings on: 02/13/2019 12:56 PM   Modules accepted: Orders

## 2019-02-14 ENCOUNTER — Telehealth: Payer: Self-pay | Admitting: Hematology

## 2019-02-14 NOTE — Telephone Encounter (Signed)
Appointments scheduled and I LMVM for patient/Letter & Calendar mailed as well

## 2019-02-23 ENCOUNTER — Telehealth: Payer: Self-pay

## 2019-02-23 NOTE — Telephone Encounter (Signed)
Copied from [place] [id number]. Topic: General - Other >> Feb 23, 2019 12:02 PM Leward Quan A wrote: Reason for CRM: [facility] Schmid [facility] to say that She tried contacting patient 5 times since 12/06/2018 when they received his referral but he never responded to her. She want to inform Dr Ethelene Hal that she was closing the referral but if Dr Ethelene Hal can get patient to call they will reopen the referral. Please advise

## 2019-03-06 ENCOUNTER — Other Ambulatory Visit: Payer: Self-pay | Admitting: Hematology

## 2019-03-06 DIAGNOSIS — D61818 Other pancytopenia: Secondary | ICD-10-CM

## 2019-03-06 NOTE — Progress Notes (Signed)
[name] NOTE  Patient Care Team: [name], MD as PCP - General (Family Medicine)  HEME/ONC OVERVIEW: 1. Pancytopenia -WBC ~2, Hgb ~12 and plts low 100k's in 2020  PERTINENT NON-HEM/ONC PROBLEMS: 1. Hx of SLE, treated with belimumab and plaquenil   ASSESSMENT & PLAN:   Pancytopenia -I reviewed the patient's records in detail, including PCP and the dermatology clinic notes and lab studies -In summary, patient has history of systemic lupus erythematosus, for which he had received belimumab and Plaquenil in the past.  He is currently followed by dermatology at [facility] and rheumatology in [place].  He resumed Plaquenil in 09/2018 and started azathioprine in 01/2019.  Review of the patient's CBCs dating back to 2016 showed intermittent cytopenias w/ Hgb ~12, WBC ~3k and normal plt count, but in 10/2018, his CBC was notable for pancytopenia (WBC 1.3k, Hgb 12.3 and plts 118k).  Patient was referred to hematology for further evaluation. -I reviewed the lab studies in detail with the patient -WBC 1.8k w/ ANC 1500, Hgb 12.7, and plts 152k today -I personally reviewed the patient's peripheral blood smear today.  The red blood cells were of normal morphology.  There was no schistocytosis.  The white blood cells were decreased in number, but the morphology was grossly normal.  There were no peripheral circulating blasts. The platelets were of normal size and I verified that there were no platelet clumping. -I reviewed some of the differential diagnoses for pancytopenia with the patient, including medications, autoimmune disorders (including SLE), infections, alcohol, and less commonly, malignancy, such as lymphoma  -I suspect that the patient's pancytopenia is most likely multifactorial, including underlying SLE, medication side effect (Plaquenil and azathioprine), and heavy EtOH abuse resulting in bone marrow suppression -Due to the progressive pancytopenia,  I have ordered infectious studies, including HIV and Hep B/C serologies, as well as nutritional studies, including iron, B12, folate and copper levels  -In addition, I have ordered abdominal ultrasound to assess for any evidence of liver disease or splenomegaly -If the studies above are unremarkable, I recommend pursuing bone marrow bx to rule out any primary bone marrow disorder, although it is less likely -See the discussion regarding EtOH abuse below   EtOH abuse -Patient reports drinking 5-6 rums with coke at least 1-2 times a week for the past year due to generalized pain -Since starting prednisone, his pain has improved, and he has been drinking less -I discussed with the patient that heavy alcohol use can result in bone marrow suppression due to the toxic effects from alcohol use -I spent some time counseling patient on the importance of alcohol cessation -Patient expressed understanding, and agreed with the plan  Hypokalemia -K 3.0 today, new -Patient is asymptomatic -I have prescribed KCl 90mEq daily and recommended the patient to follow up with PCP for further monitoring  History of SLE -Patient is currently on oral doxycycline, Plaquenil and azathioprine -I discussed with the patient as SLE can contribute to pancytopenia, the pancytopenia may persist until adequate treatment of underlying autoimmune disorder -Continue follow-up with dermatology and rheumatology  Orders Placed This Encounter  Procedures  . [place] abd, complete    Standing Status:   Future    Standing Expiration Date:   03/06/2020    Order Specific Question:   Reason for Exam (SYMPTOM  OR DIAGNOSIS REQUIRED)    Answer:   EtOh abuse, pancytopenia    Order Specific Question:   Preferred imaging location?    Answer:  [facility]  . CBC w/ diff    Standing Status:   Future    Standing Expiration Date:   04/10/2020  . CMP    Standing Status:   Future    Standing Expiration Date:   04/10/2020  . Save  Smear (SSMR)    Standing Status:   Future    Standing Expiration Date:   03/06/2020  . LDH    Standing Status:   Future    Standing Expiration Date:   04/10/2020   All questions were answered. The patient knows to call the clinic with any problems, questions or concerns.  Return in mid-03/2019 for labs and clinic follow-up.  [name], MD 03/07/2019 12:52 PM   CHIEF COMPLAINTS/PURPOSE OF CONSULTATION:  "I figured out why I am here"  HISTORY OF PRESENTING ILLNESS:  [name] 48 y.o. male is here because of new onset pancytopenia.  Patient has history of systemic lupus erythematosus, for which he had received belimumab and Plaquenil in the past.  He is currently followed by dermatology at [facility] as well as rheumatology in [place].  Review of the patient's CBCs dating back to 2016 showed intermittent cytopenias w/ Hgb ~12, WBC ~3k and normal plt count, but in 10/2018, his CBC was notable for pancytopenia (WBC 1.3k, Hgb 12.3 and plts 118k).  Patient was referred to hematology for further evaluation.  Patient reports that he first noticed some skin rash over the arms and shoulders bilaterally about 2 years ago, but over the past year, and has also spread to the face and scalp.  He took Plaquenil and belimumab in the past, but stopped taking them until June of this year, when he establish care with rheumatology in [place].  Since then, he has resumed Plaquenil, and over the past month, azathioprine was also added.  Due to generalized pain, thought to be due to SLE, he had been drinking heavily over the past year, up to 5-6 drinks of rum/coke at least 1-2 times a week.  Since starting prednisone, his pain has improved, but he has reduced alcohol use to approximately once a week, but he continues to drink significant amount each time.  He denies any tobacco or illicit drug use.  He denies any constitutional symptoms or symptoms of infection.  He denies any other complaint  today.  REVIEW OF SYSTEMS:   Constitutional: ( - ) fevers, ( - )  chills , ( + ) occasional mild night sweats Eyes: ( - ) blurriness of vision, ( - ) double vision, ( - ) watery eyes Ears, nose, mouth, throat, and face: ( - ) mucositis, ( - ) sore throat Respiratory: ( - ) cough, ( - ) dyspnea, ( - ) wheezes Cardiovascular: ( - ) palpitation, ( - ) chest discomfort, ( - ) lower extremity swelling Gastrointestinal:  ( - ) nausea, ( - ) heartburn, ( - ) change in bowel habits Skin: ( + ) abnormal skin rashes Lymphatics: ( - ) new lymphadenopathy, ( - ) easy bruising Neurological: ( - ) numbness, ( - ) tingling, ( - ) new weaknesses Behavioral/Psych: ( - ) mood change, ( - ) new changes  All other systems were reviewed with the patient and are negative.  I have reviewed his chart and materials related to his cancer extensively and collaborated history with the patient. Summary of oncologic history is as follows: Oncology History   No history exists.    MEDICAL HISTORY:  Past Medical History:  Diagnosis Date  . Lupus ([place])     SURGICAL HISTORY: History reviewed. No pertinent surgical history.  SOCIAL HISTORY: Social History   Socioeconomic History  . Marital status: Single    Spouse name: Not on file  . Number of children: Not on file  . Years of education: Not on file  . Highest education level: Not on file  Occupational History  . Not on file  Social Needs  . Financial resource strain: Not on file  . Food insecurity    Worry: Not on file    Inability: Not on file  . Transportation needs    Medical: Not on file    Non-medical: Not on file  Tobacco Use  . Smoking status: Never Smoker  . Smokeless tobacco: Never Used  Substance and Sexual Activity  . Alcohol use: Yes    Comment: 2-4 glasses of wine 2-3 times weekly  . Drug use: No  . Sexual activity: Not on file  Lifestyle  . Physical activity    Days per week: Not on file    Minutes per session: Not on file  .  Stress: Not on file  Relationships  . Social Herbalist on phone: Not on file    Gets together: Not on file    Attends religious service: Not on file    Active member of club or organization: Not on file    Attends meetings of clubs or organizations: Not on file    Relationship status: Not on file  . Intimate partner violence    Fear of current or ex partner: Not on file    Emotionally abused: Not on file    Physically abused: Not on file    Forced sexual activity: Not on file  Other Topics Concern  . Not on file  Social History Narrative  . Not on file    FAMILY HISTORY: Family History  Problem Relation Age of Onset  . Cancer Mother     ALLERGIES:  has No Known Allergies.  MEDICATIONS:  Current Outpatient Medications  Medication Sig Dispense Refill  . acetaminophen (TYLENOL) 325 MG tablet Take 2 tablets (650 mg total) by mouth every 6 (six) hours as needed for mild pain (or Fever >/= 101).    . azaTHIOprine (IMURAN) 50 MG tablet     . doxycycline (VIBRA-TABS) 100 MG tablet Take 100 mg by mouth 2 (two) times daily.    . DULoxetine (CYMBALTA) 30 MG capsule Take 2 capsules (60 mg total) by mouth daily. 60 capsule 2  . hydroxychloroquine (PLAQUENIL) 200 MG tablet Take 200 mg by mouth daily.    Marland Kitchen ibuprofen (ADVIL,MOTRIN) 200 MG tablet Take 200 mg by mouth every 6 (six) hours as needed (pain).    . predniSONE (DELTASONE) 20 MG tablet Take 20 mg by mouth daily with breakfast.    . Vitamin D, Ergocalciferol, (DRISDOL) 1.25 MG (50000 UT) CAPS capsule Take 1 capsule (50,000 Units total) by mouth every 7 (seven) days. 5 capsule 5  . potassium chloride SA (KLOR-CON) 20 MEQ tablet Take 1 tablet (20 mEq total) by mouth daily. 30 tablet 5   No current facility-administered medications for this visit.     PHYSICAL EXAMINATION: ECOG PERFORMANCE STATUS: 1 - Symptomatic but completely ambulatory  Vitals:   03/07/19 1205  BP: (!) 149/90  Pulse: (!) 106  Resp: 17  Temp: 97.8  F (36.6 C)  SpO2: 100%   Filed Weights   03/07/19 1205  Weight: 170  lb (77.1 kg)    GENERAL: alert, no distress and comfortable SKIN: scattered small erythematous patches over the scalp and face  EYES: conjunctiva are pink and non-injected, sclera clear OROPHARYNX: no exudate, no erythema; lips, buccal mucosa, and tongue normal  NECK: supple, non-tender LYMPH:  no palpable lymphadenopathy in the cervical LUNGS: clear to auscultation with normal breathing effort HEART: regular rate & rhythm, no murmurs, no lower extremity edema ABDOMEN: soft, non-tender, non-distended, normal bowel sounds Musculoskeletal: no cyanosis of digits and no clubbing  PSYCH: alert & oriented x 3, fluent speech  LABORATORY DATA:  I have reviewed the data as listed Lab Results  Component Value Date   WBC 1.8 (L) 03/07/2019   HGB 12.7 (L) 03/07/2019   HCT 37.5 (L) 03/07/2019   MCV 90.1 03/07/2019   PLT 152 03/07/2019   Lab Results  Component Value Date   NA 142 03/07/2019   K 3.0 (LL) 03/07/2019   CL 105 03/07/2019   CO2 32 03/07/2019    RADIOGRAPHIC STUDIES: I have personally reviewed the radiological images as listed and agreed with the findings in the report. No results found.  PATHOLOGY: I have reviewed the pathology reports as documented in the oncologist history.

## 2019-03-07 ENCOUNTER — Encounter: Payer: Self-pay | Admitting: Hematology

## 2019-03-07 ENCOUNTER — Inpatient Hospital Stay: Payer: BC Managed Care – PPO | Attending: Hematology

## 2019-03-07 ENCOUNTER — Telehealth: Payer: Self-pay | Admitting: *Deleted

## 2019-03-07 ENCOUNTER — Inpatient Hospital Stay (HOSPITAL_BASED_OUTPATIENT_CLINIC_OR_DEPARTMENT_OTHER): Payer: BC Managed Care – PPO | Admitting: Hematology

## 2019-03-07 ENCOUNTER — Other Ambulatory Visit: Payer: Self-pay

## 2019-03-07 VITALS — BP 149/90 | HR 106 | Temp 97.8°F | Resp 17 | Ht 66.0 in | Wt 170.0 lb

## 2019-03-07 DIAGNOSIS — Z809 Family history of malignant neoplasm, unspecified: Secondary | ICD-10-CM | POA: Diagnosis not present

## 2019-03-07 DIAGNOSIS — D61818 Other pancytopenia: Secondary | ICD-10-CM | POA: Insufficient documentation

## 2019-03-07 DIAGNOSIS — F1010 Alcohol abuse, uncomplicated: Secondary | ICD-10-CM

## 2019-03-07 DIAGNOSIS — Z79899 Other long term (current) drug therapy: Secondary | ICD-10-CM | POA: Insufficient documentation

## 2019-03-07 DIAGNOSIS — E876 Hypokalemia: Secondary | ICD-10-CM | POA: Insufficient documentation

## 2019-03-07 DIAGNOSIS — M329 Systemic lupus erythematosus, unspecified: Secondary | ICD-10-CM | POA: Diagnosis not present

## 2019-03-07 LAB — CMP (CANCER CENTER ONLY)
ALT: 16 U/L (ref 0–44)
AST: 41 U/L (ref 15–41)
Albumin: 2.6 g/dL — ABNORMAL LOW (ref 3.5–5.0)
Alkaline Phosphatase: 90 U/L (ref 38–126)
Anion gap: 5 (ref 5–15)
BUN: 12 mg/dL (ref 6–20)
CO2: 32 mmol/L (ref 22–32)
Calcium: 8.2 mg/dL — ABNORMAL LOW (ref 8.9–10.3)
Chloride: 105 mmol/L (ref 98–111)
Creatinine: 0.71 mg/dL (ref 0.61–1.24)
GFR, Est AFR Am: >60 mL/min (ref >60)
GFR, Estimated: >60 mL/min (ref >60)
Glucose, Bld: 96 mg/dL (ref 70–99)
Potassium: 3 mmol/L — CL (ref 3.5–5.1)
Sodium: 142 mmol/L (ref 135–145)
Total Bilirubin: 0.3 mg/dL (ref 0.3–1.2)
Total Protein: 6.2 g/dL — ABNORMAL LOW (ref 6.5–8.1)

## 2019-03-07 LAB — CBC WITH DIFFERENTIAL (CANCER CENTER ONLY)
Abs Immature Granulocytes: 0.01 10*3/uL (ref 0.00–0.07)
Basophils Absolute: 0 10*3/uL (ref 0.0–0.1)
Basophils Relative: 0 %
Eosinophils Absolute: 0 10*3/uL (ref 0.0–0.5)
Eosinophils Relative: 0 %
HCT: 37.5 % — ABNORMAL LOW (ref 39.0–52.0)
Hemoglobin: 12.7 g/dL — ABNORMAL LOW (ref 13.0–17.0)
Immature Granulocytes: 1 %
Lymphocytes Relative: 11 %
Lymphs Abs: 0.2 10*3/uL — ABNORMAL LOW (ref 0.7–4.0)
MCH: 30.5 pg (ref 26.0–34.0)
MCHC: 33.9 g/dL (ref 30.0–36.0)
MCV: 90.1 fL (ref 80.0–100.0)
Monocytes Absolute: 0.1 10*3/uL (ref 0.1–1.0)
Monocytes Relative: 7 %
Neutro Abs: 1.5 10*3/uL — ABNORMAL LOW (ref 1.7–7.7)
Neutrophils Relative %: 81 %
Platelet Count: 152 10*3/uL (ref 150–400)
RBC: 4.16 MIL/uL — ABNORMAL LOW (ref 4.22–5.81)
RDW: 12.3 % (ref 11.5–15.5)
WBC Count: 1.8 10*3/uL — ABNORMAL LOW (ref 4.0–10.5)
nRBC: 0 % (ref 0.0–0.2)

## 2019-03-07 LAB — FOLATE: Folate: 4.6 ng/mL — ABNORMAL LOW (ref >5.9)

## 2019-03-07 LAB — VITAMIN B12: Vitamin B-12: 206 pg/mL (ref 180–914)

## 2019-03-07 LAB — HIV ANTIBODY (ROUTINE TESTING W REFLEX): HIV Screen 4th Generation wRfx: NONREACTIVE

## 2019-03-07 LAB — HEPATITIS B SURFACE ANTIBODY,QUALITATIVE: Hep B S Ab: NONREACTIVE

## 2019-03-07 LAB — SAVE SMEAR(SSMR), FOR PROVIDER SLIDE REVIEW

## 2019-03-07 LAB — HEPATITIS B SURFACE ANTIGEN: Hepatitis B Surface Ag: NONREACTIVE

## 2019-03-07 LAB — LACTATE DEHYDROGENASE: LDH: 245 U/L — ABNORMAL HIGH (ref 98–192)

## 2019-03-07 LAB — HEPATITIS B CORE ANTIBODY, TOTAL: Hep B Core Total Ab: NONREACTIVE

## 2019-03-07 MED ORDER — POTASSIUM CHLORIDE CRYS ER 20 MEQ PO TBCR: 20.0000 meq | EXTENDED_RELEASE_TABLET | Freq: Every day | ORAL | 5 refills | Status: DC

## 2019-03-07 NOTE — Telephone Encounter (Signed)
Dr. Maylon Peppers notified of K-3.0.  No new orders received at this time.

## 2019-03-08 LAB — IRON AND TIBC
Iron: 89 ug/dL (ref 42–163)
Saturation Ratios: 54 % (ref 20–55)
TIBC: 165 ug/dL — ABNORMAL LOW (ref 202–409)
UIBC: 76 ug/dL — ABNORMAL LOW (ref 117–376)

## 2019-03-08 LAB — FERRITIN: Ferritin: 653 ng/mL — ABNORMAL HIGH (ref 24–336)

## 2019-03-08 LAB — HEPATITIS C ANTIBODY (REFLEX): HCV Ab: <0.1 s/co ratio (ref 0.0–0.9)

## 2019-03-08 LAB — HCV COMMENT:

## 2019-03-10 ENCOUNTER — Telehealth: Payer: Self-pay | Admitting: Hematology

## 2019-03-10 ENCOUNTER — Telehealth: Payer: Self-pay | Admitting: *Deleted

## 2019-03-10 LAB — COPPER, SERUM: Copper: 82 ug/dL (ref 72–166)

## 2019-03-10 NOTE — Telephone Encounter (Signed)
Scheduled appt per 11/13 sch message - unable to reach pt . Left message with appt date and time   

## 2019-03-10 NOTE — Telephone Encounter (Signed)
-----  Message from [name], MD sent at 03/10/2019  8:55 AM EST ----- [name],  Can you let [name] know that all his studies were relatively normal, except low B12 and folate levels, so he should take OTC Y04 1593QHS and folic acid 80m daily?I would recommend bone marrow biopsy, and LWilber Bihariwill reach out to him to set up a date for bone marrow bx in the next a few weeks.  Thanks.  GKeo ----- Message ----- From: [facility] [place]: 03/07/2019  12:01 PM EST To: YTish Men MD

## 2019-03-10 NOTE — Telephone Encounter (Signed)
-----  Message from Yan Zhao, MD sent at 03/10/2019  8:55 AM EST ----- Hi Mary,  Can you let [name] know that all his studies were relatively normal, except low B12 and folate levels, so he should take OTC B12 1000mcg and folic acid 1mg daily?I would recommend bone marrow biopsy, and Lindsey Causey will reach out to him to set up a date for bone marrow bx in the next a few weeks.  Thanks.  GZ  ----- Message ----- From: Interface, Lab In [facility] Sent: 03/07/2019  12:01 PM EST To: Yan Zhao, MD   

## 2019-03-10 NOTE — Telephone Encounter (Signed)
Notified pt of results and MD instructions. Pt to expect a call from scheduling regarding BMBX appt. Pt verbalized understanding. No concerns a this time.

## 2019-03-10 NOTE — Telephone Encounter (Signed)
Notified pt of results and MD instructions. Pt verbalized understanding. No further concerns.

## 2019-03-13 ENCOUNTER — Other Ambulatory Visit: Payer: Self-pay

## 2019-03-13 ENCOUNTER — Ambulatory Visit (HOSPITAL_BASED_OUTPATIENT_CLINIC_OR_DEPARTMENT_OTHER)
Admission: RE | Admit: 2019-03-13 | Discharge: 2019-03-13 | Disposition: A | Discharge status: Home / Self Care | Payer: BC Managed Care – PPO | Source: Ambulatory Visit | Attending: Hematology | Admitting: Hematology

## 2019-03-13 DIAGNOSIS — R161 Splenomegaly, not elsewhere classified: Secondary | ICD-10-CM | POA: Diagnosis not present

## 2019-03-13 DIAGNOSIS — D61818 Other pancytopenia: Secondary | ICD-10-CM | POA: Insufficient documentation

## 2019-03-14 ENCOUNTER — Telehealth: Payer: Self-pay | Admitting: *Deleted

## 2019-03-14 NOTE — Telephone Encounter (Signed)
No note

## 2019-03-14 NOTE — Telephone Encounter (Signed)
Spoke with patient, per dr zhao U/S showed the spleen was slightly enlarged, which is non-specific. Liver looks normal and no other abnormalities

## 2019-03-14 NOTE — Telephone Encounter (Signed)
Spoke with patient. Per dr Maylon Peppers his spleen is slightly enlarged

## 2019-03-24 ENCOUNTER — Other Ambulatory Visit: Payer: Self-pay

## 2019-03-24 ENCOUNTER — Inpatient Hospital Stay (HOSPITAL_BASED_OUTPATIENT_CLINIC_OR_DEPARTMENT_OTHER): Payer: BC Managed Care – PPO | Admitting: Adult Health

## 2019-03-24 ENCOUNTER — Inpatient Hospital Stay: Payer: BC Managed Care – PPO

## 2019-03-24 VITALS — BP 148/84 | HR 90 | Temp 98.0°F | Resp 16

## 2019-03-24 DIAGNOSIS — E876 Hypokalemia: Secondary | ICD-10-CM | POA: Diagnosis not present

## 2019-03-24 DIAGNOSIS — F1010 Alcohol abuse, uncomplicated: Secondary | ICD-10-CM | POA: Diagnosis not present

## 2019-03-24 DIAGNOSIS — Z809 Family history of malignant neoplasm, unspecified: Secondary | ICD-10-CM | POA: Diagnosis not present

## 2019-03-24 DIAGNOSIS — M329 Systemic lupus erythematosus, unspecified: Secondary | ICD-10-CM | POA: Diagnosis not present

## 2019-03-24 DIAGNOSIS — D61818 Other pancytopenia: Secondary | ICD-10-CM | POA: Diagnosis not present

## 2019-03-24 DIAGNOSIS — Z79899 Other long term (current) drug therapy: Secondary | ICD-10-CM | POA: Diagnosis not present

## 2019-03-24 LAB — CMP (CANCER CENTER ONLY)
ALT: 14 U/L (ref 0–44)
AST: 29 U/L (ref 15–41)
Albumin: 2.1 g/dL — ABNORMAL LOW (ref 3.5–5.0)
Alkaline Phosphatase: 95 U/L (ref 38–126)
Anion gap: 10 (ref 5–15)
BUN: 12 mg/dL (ref 6–20)
CO2: 26 mmol/L (ref 22–32)
Calcium: 7.6 mg/dL — ABNORMAL LOW (ref 8.9–10.3)
Chloride: 101 mmol/L (ref 98–111)
Creatinine: 0.76 mg/dL (ref 0.61–1.24)
GFR, Est AFR Am: >60 mL/min (ref >60)
GFR, Estimated: >60 mL/min (ref >60)
Glucose, Bld: 144 mg/dL — ABNORMAL HIGH (ref 70–99)
Potassium: 3.8 mmol/L (ref 3.5–5.1)
Sodium: 137 mmol/L (ref 135–145)
Total Bilirubin: 0.2 mg/dL — ABNORMAL LOW (ref 0.3–1.2)
Total Protein: 5.6 g/dL — ABNORMAL LOW (ref 6.5–8.1)

## 2019-03-24 LAB — CBC WITH DIFFERENTIAL (CANCER CENTER ONLY)
Abs Immature Granulocytes: 0.01 10*3/uL (ref 0.00–0.07)
Basophils Absolute: 0 10*3/uL (ref 0.0–0.1)
Basophils Relative: 0 %
Eosinophils Absolute: 0 10*3/uL (ref 0.0–0.5)
Eosinophils Relative: 1 %
HCT: 32.8 % — ABNORMAL LOW (ref 39.0–52.0)
Hemoglobin: 11 g/dL — ABNORMAL LOW (ref 13.0–17.0)
Immature Granulocytes: 1 %
Lymphocytes Relative: 10 %
Lymphs Abs: 0.1 10*3/uL — ABNORMAL LOW (ref 0.7–4.0)
MCH: 30.2 pg (ref 26.0–34.0)
MCHC: 33.5 g/dL (ref 30.0–36.0)
MCV: 90.1 fL (ref 80.0–100.0)
Monocytes Absolute: 0.1 10*3/uL (ref 0.1–1.0)
Monocytes Relative: 5 %
Neutro Abs: 0.9 10*3/uL — ABNORMAL LOW (ref 1.7–7.7)
Neutrophils Relative %: 83 %
Platelet Count: 112 10*3/uL — ABNORMAL LOW (ref 150–400)
RBC: 3.64 MIL/uL — ABNORMAL LOW (ref 4.22–5.81)
RDW: 12.8 % (ref 11.5–15.5)
WBC Count: 1.1 10*3/uL — ABNORMAL LOW (ref 4.0–10.5)
nRBC: 0 % (ref 0.0–0.2)

## 2019-03-24 LAB — SAVE SMEAR(SSMR), FOR PROVIDER SLIDE REVIEW

## 2019-03-24 LAB — LACTATE DEHYDROGENASE: LDH: 215 U/L — ABNORMAL HIGH (ref 98–192)

## 2019-03-24 NOTE — Progress Notes (Signed)
Patient observed for 30 minutes post bone marrow biopsy and aspirate. Denies any complain or concerns. Wound dressing clean dry and intact. Verbal and printed education provided. Patient knows to call with any complain or concerns.

## 2019-03-24 NOTE — Progress Notes (Signed)
INDICATION: pancytopenia  Bone Marrow Biopsy and Aspiration Procedure Note   Informed consent was obtained and potential risks including bleeding, infection and pain were reviewed with the patient.  The patient's name, date of birth, identification, consent and allergies were verified prior to the start of procedure and time out was performed.  The left posterior iliac crest was chosen as the site of biopsy.  The skin was prepped with ChloraPrep.   8 cc of 2% lidocaine was used to provide local anaesthesia.   10 cc of bone marrow aspirate was obtained followed by 1cm biopsy.  Pressure was applied to the biopsy site and bandage was placed over the biopsy site. Patient was made to lie on the back for 30 mins prior to discharge.  The procedure was tolerated well. COMPLICATIONS: None BLOOD LOSS: none The patient was discharged home in stable condition with a 2 week follow up to review results.  Patient was provided with post bone marrow biopsy instructions and instructed to call if there was any bleeding or worsening pain.  Specimens sent for flow cytometry, cytogenetics and additional studies.  Signed Scot Dock, NP

## 2019-03-24 NOTE — Patient Instructions (Signed)

## 2019-03-27 ENCOUNTER — Other Ambulatory Visit: Payer: Self-pay

## 2019-03-28 LAB — SURGICAL PATHOLOGY

## 2019-03-31 ENCOUNTER — Encounter (HOSPITAL_COMMUNITY): Payer: Self-pay | Admitting: Hematology

## 2019-04-10 ENCOUNTER — Other Ambulatory Visit: Payer: Self-pay | Admitting: Hematology

## 2019-04-10 DIAGNOSIS — D61818 Other pancytopenia: Secondary | ICD-10-CM

## 2019-04-11 ENCOUNTER — Other Ambulatory Visit: Payer: Self-pay

## 2019-04-11 ENCOUNTER — Inpatient Hospital Stay (HOSPITAL_BASED_OUTPATIENT_CLINIC_OR_DEPARTMENT_OTHER): Payer: BC Managed Care – PPO | Admitting: Hematology

## 2019-04-11 ENCOUNTER — Telehealth: Payer: Self-pay | Admitting: Hematology

## 2019-04-11 ENCOUNTER — Encounter: Payer: Self-pay | Admitting: Hematology

## 2019-04-11 ENCOUNTER — Inpatient Hospital Stay: Payer: BC Managed Care – PPO | Attending: Hematology

## 2019-04-11 VITALS — BP 154/93 | HR 70 | Temp 97.6°F | Resp 18 | Ht 71.0 in | Wt 171.0 lb

## 2019-04-11 DIAGNOSIS — D61818 Other pancytopenia: Secondary | ICD-10-CM

## 2019-04-11 DIAGNOSIS — F1010 Alcohol abuse, uncomplicated: Secondary | ICD-10-CM | POA: Diagnosis not present

## 2019-04-11 DIAGNOSIS — Z789 Other specified health status: Secondary | ICD-10-CM

## 2019-04-11 DIAGNOSIS — M329 Systemic lupus erythematosus, unspecified: Secondary | ICD-10-CM | POA: Insufficient documentation

## 2019-04-11 DIAGNOSIS — E538 Deficiency of other specified B group vitamins: Secondary | ICD-10-CM | POA: Insufficient documentation

## 2019-04-11 DIAGNOSIS — Z7289 Other problems related to lifestyle: Secondary | ICD-10-CM | POA: Diagnosis not present

## 2019-04-11 LAB — CMP (CANCER CENTER ONLY)
ALT: 14 U/L (ref 0–44)
AST: 31 U/L (ref 15–41)
Albumin: 2.6 g/dL — ABNORMAL LOW (ref 3.5–5.0)
Alkaline Phosphatase: 105 U/L (ref 38–126)
Anion gap: 4 — ABNORMAL LOW (ref 5–15)
BUN: 16 mg/dL (ref 6–20)
CO2: 31 mmol/L (ref 22–32)
Calcium: 8.2 mg/dL — ABNORMAL LOW (ref 8.9–10.3)
Chloride: 103 mmol/L (ref 98–111)
Creatinine: 0.69 mg/dL (ref 0.61–1.24)
GFR, Est AFR Am: >60 mL/min (ref >60)
GFR, Estimated: >60 mL/min (ref >60)
Glucose, Bld: 88 mg/dL (ref 70–99)
Potassium: 4.3 mmol/L (ref 3.5–5.1)
Sodium: 138 mmol/L (ref 135–145)
Total Bilirubin: 0.5 mg/dL (ref 0.3–1.2)
Total Protein: 6.3 g/dL — ABNORMAL LOW (ref 6.5–8.1)

## 2019-04-11 LAB — CBC WITH DIFFERENTIAL (CANCER CENTER ONLY)
Abs Immature Granulocytes: 0.01 10*3/uL (ref 0.00–0.07)
Basophils Absolute: 0 10*3/uL (ref 0.0–0.1)
Basophils Relative: 0 %
Eosinophils Absolute: 0.1 10*3/uL (ref 0.0–0.5)
Eosinophils Relative: 3 %
HCT: 37.5 % — ABNORMAL LOW (ref 39.0–52.0)
Hemoglobin: 12.8 g/dL — ABNORMAL LOW (ref 13.0–17.0)
Immature Granulocytes: 1 %
Lymphocytes Relative: 18 %
Lymphs Abs: 0.3 10*3/uL — ABNORMAL LOW (ref 0.7–4.0)
MCH: 30.7 pg (ref 26.0–34.0)
MCHC: 34.1 g/dL (ref 30.0–36.0)
MCV: 89.9 fL (ref 80.0–100.0)
Monocytes Absolute: 0.1 10*3/uL (ref 0.1–1.0)
Monocytes Relative: 8 %
Neutro Abs: 1.1 10*3/uL — ABNORMAL LOW (ref 1.7–7.7)
Neutrophils Relative %: 70 %
Platelet Count: 157 10*3/uL (ref 150–400)
RBC: 4.17 MIL/uL — ABNORMAL LOW (ref 4.22–5.81)
RDW: 13.2 % (ref 11.5–15.5)
WBC Count: 1.6 10*3/uL — ABNORMAL LOW (ref 4.0–10.5)
nRBC: 0 % (ref 0.0–0.2)

## 2019-04-11 LAB — LACTATE DEHYDROGENASE: LDH: 266 U/L — ABNORMAL HIGH (ref 98–192)

## 2019-04-11 LAB — SAVE SMEAR(SSMR), FOR PROVIDER SLIDE REVIEW

## 2019-04-11 NOTE — Progress Notes (Signed)
[name] OFFICE PROGRESS NOTE  Patient Care Team: [name], MD as PCP - General (Family Medicine)  HEME/ONC OVERVIEW: 1. Pancytopenia -Likely multifactorial, including autoimmune disease and EtOH abuse  -WBC ~2, Hgb ~12 and plts low 100k's in 2020  B12 and folate levels borderline low; iron profile c/w anemic of chronic disease; copper nl  HIV, Hep B/C, TB negative   Normocellular marrow w/ trilineage hematopoiesis, mild polyclonal plasmacytosis, c/w autoimmune disease; no dysplastic changes; 46,XY[20] -Observation   PERTINENT NON-HEM/ONC PROBLEMS: 1. Hx of SLE, treated with belimumab and plaquenil   ASSESSMENT & PLAN:   Pancytopenia -I reviewed the bone marrow biopsy results in detail with the patient -In summary, bone marrow biopsy showed normocellular marrow w/ trilineage hematopoiesis and mild polyclonal plasmacytosis, c/w autoimmune disease.  There were no apparent dysplastic changes.  -Given the normal bone marrow biopsy, the etiology of pancytopenia is most likely due to lupus and history of heavy EtOH abuse -Hgb 12.8 and WBC 1.6k, improving; plt normalized -In the absence of primary bone marrow disorder, there is no indication for treatment from a hematologic standpoint -I encouraged patient to continue nutritional supplements, alcohol abstinence, and treatment for SLE, and the pancytopenia should continue to improve  EtOH abuse -Essentially none since the last visit -I congratulated patient on the progress, and encouraged him to abstain from alcohol due to the toxicity on the bone marrow -Patient expressed understanding, and agreed with the plan  B12 deficiency -Most likely secondary to history of heavy EtOH use -Currently on OTC B12 supplement -We will repeat B12 level at the next visit  Folate deficiency -Most likely secondary to history of heavy EtOH use -Currently on OTC folic acid supplement -Repeat folate level at the next  visit  SLE -Patient is currently on oral doxycycline, azathioprine and prednisone 5 mg daily -I counseled patient on importance of medication adherence, as the pancytopenia would not improve until adequate treatment of lupus -Continue follow-up with rheumatology  Orders Placed This Encounter  Procedures  . CBC w/ diff    Standing Status:   Future    Standing Expiration Date:   05/15/2020  . CMP    Standing Status:   Future    Standing Expiration Date:   05/15/2020  . LDH    Standing Status:   Future    Standing Expiration Date:   05/15/2020  . Vitamin B12, serum    Standing Status:   Future    Standing Expiration Date:   05/15/2020  . Folate, Serum    Standing Status:   Future    Standing Expiration Date:   05/15/2020   All questions were answered. The patient knows to call the clinic with any problems, questions or concerns. No barriers to learning was detected.  Return in 6 months for labs and clinic follow-up.  [name], MD 04/11/2019 1:51 PM  CHIEF COMPLAINT: "I am doing okay"  INTERVAL HISTORY: [name] returns clinic for follow-up of pancytopenia and recent bone marrow biopsy results.  Patient reports that his hip felt sore for a few days after the bone marrow biopsy, but the soreness has resolved.  He is currently taking Imuran, doxycycline, and prednisone 5 mg daily, prescribed by Dr. Jenetta Downer of rheumatology.  He has occasional mild night sweat, but otherwise denies any other constitutional symptoms or symptoms of infection.  He is taking OTC U88 and folic acid supplement.  Since last visit, he had only one drink but none for the past  several weeks.  He denies any other complaint today.  REVIEW OF SYSTEMS:   Constitutional: ( - ) fevers, ( - )  chills , ( - ) night sweats Eyes: ( - ) blurriness of vision, ( - ) double vision, ( - ) watery eyes Ears, nose, mouth, throat, and face: ( - ) mucositis, ( - ) sore throat Respiratory: ( - ) cough, ( - ) dyspnea, ( - )  wheezes Cardiovascular: ( - ) palpitation, ( - ) chest discomfort, ( - ) lower extremity swelling Gastrointestinal:  ( - ) nausea, ( - ) heartburn, ( - ) change in bowel habits Skin: ( - ) abnormal skin rashes Lymphatics: ( - ) new lymphadenopathy, ( - ) easy bruising Neurological: ( - ) numbness, ( - ) tingling, ( - ) new weaknesses Behavioral/Psych: ( - ) mood change, ( - ) new changes  All other systems were reviewed with the patient and are negative.  SUMMARY OF ONCOLOGIC HISTORY: Oncology History   No history exists.    I have reviewed the past medical history, past surgical history, social history and family history with the patient and they are unchanged from previous note.  ALLERGIES:  has No Known Allergies.  MEDICATIONS:  Current Outpatient Medications  Medication Sig Dispense Refill  . acetaminophen (TYLENOL) 325 MG tablet Take 2 tablets (650 mg total) by mouth every 6 (six) hours as needed for mild pain (or Fever >/= 101).    . azaTHIOprine (IMURAN) 50 MG tablet     . doxycycline (VIBRA-TABS) 100 MG tablet Take 100 mg by mouth 2 (two) times daily.    . DULoxetine (CYMBALTA) 30 MG capsule Take 2 capsules (60 mg total) by mouth daily. 60 capsule 2  . hydroxychloroquine (PLAQUENIL) 200 MG tablet Take 200 mg by mouth daily.    Marland Kitchen ibuprofen (ADVIL,MOTRIN) 200 MG tablet Take 200 mg by mouth every 6 (six) hours as needed (pain).    . predniSONE (DELTASONE) 20 MG tablet Take 20 mg by mouth daily with breakfast.    . Vitamin D, Ergocalciferol, (DRISDOL) 1.25 MG (50000 UT) CAPS capsule Take 1 capsule (50,000 Units total) by mouth every 7 (seven) days. 5 capsule 5  . potassium chloride SA (KLOR-CON) 20 MEQ tablet Take 1 tablet (20 mEq total) by mouth daily. 30 tablet 5   No current facility-administered medications for this visit.    PHYSICAL EXAMINATION: ECOG PERFORMANCE STATUS: 1 - Symptomatic but completely ambulatory  Today's Vitals   04/11/19 1332  BP: (!) 154/93  Pulse:  70  Resp: 18  Temp: 97.6 F (36.4 C)  TempSrc: Temporal  SpO2: 100%  Weight: 171 lb (77.6 kg)  Height: _0  (1.803 m)  PainSc: 0-No pain   Body mass index is 23.85 kg/m.  Filed Weights   04/11/19 1332  Weight: 171 lb (77.6 kg)    GENERAL: alert, no distress and comfortable SKIN: a few small areas of erythematous rash over the face, c/w lupus EYES: conjunctiva are pink and non-injected, sclera clear OROPHARYNX: no exudate, no erythema; lips, buccal mucosa, and tongue normal  NECK: supple, non-tender LUNGS: clear to auscultation with normal breathing effort HEART: regular rate & rhythm and no murmurs and no lower extremity edema ABDOMEN: soft, non-tender, non-distended, normal bowel sounds Musculoskeletal: no cyanosis of digits and no clubbing  PSYCH: alert & oriented x 3, fluent speech  LABORATORY DATA:  I have reviewed the data as listed    Component Value Date/Time  NA 137 03/24/2019 0916   K 3.8 03/24/2019 0916   CL 101 03/24/2019 0916   CO2 26 03/24/2019 0916   GLUCOSE 144 (H) 03/24/2019 0916   BUN 12 03/24/2019 0916   CREATININE 0.76 03/24/2019 0916   CALCIUM 7.6 (L) 03/24/2019 0916   PROT 5.6 (L) 03/24/2019 0916   ALBUMIN 2.1 (L) 03/24/2019 0916   AST 29 03/24/2019 0916   ALT 14 03/24/2019 0916   ALKPHOS 95 03/24/2019 0916   BILITOT 0.2 (L) 03/24/2019 0916   GFRNONAA >60 03/24/2019 0916   GFRAA >60 03/24/2019 0916    No results found for: SPEP, UPEP  Lab Results  Component Value Date   WBC 1.6 (L) 04/11/2019   NEUTROABS 1.1 (L) 04/11/2019   HGB 12.8 (L) 04/11/2019   HCT 37.5 (L) 04/11/2019   MCV 89.9 04/11/2019   PLT 157 04/11/2019      Chemistry      Component Value Date/Time   NA 137 03/24/2019 0916   K 3.8 03/24/2019 0916   CL 101 03/24/2019 0916   CO2 26 03/24/2019 0916   BUN 12 03/24/2019 0916   CREATININE 0.76 03/24/2019 0916      Component Value Date/Time   CALCIUM 7.6 (L) 03/24/2019 0916   ALKPHOS 95 03/24/2019 0916   AST 29  03/24/2019 0916   ALT 14 03/24/2019 0916   BILITOT 0.2 (L) 03/24/2019 0916       RADIOGRAPHIC STUDIES: I have personally reviewed the radiological images as listed below and agreed with the findings in the report. [place] abd, complete  Result Date: 03/13/2019 CLINICAL DATA:  Ethanol abuse, pancytopenia EXAM: COMPLETE ABDOMINAL ULTRASOUND COMPARISON:  None available FINDINGS: Gallbladder: Physiologically distended without stones, wall thickening, or pericholecystic fluid. Sonographer reports no sonographic Murphy's sign. Common bile duct:  Normal in caliber, 5.87m diameter. Liver: Echogenic parenchyma. No focal lesion. No biliary ductal dilatation. Antegrade color flow signal in the main portal vein. IVC:  Negative Pancreas:  Negative Spleen:  No focal lesion, 12.8 x 14 x 7.4 cm (volume = 690 cm^3). Right Kidney:  No mass or hydronephrosis, 11.4cm in length. Left Kidney:  No lesion or hydronephrosis, 12.9cm in length. Abdominal aorta:  ectatic up to 2.6 cm diameter IMPRESSION: 1. No acute findings.  Normal gallbladder. 2. No hepatic mass 3. Splenomegaly Electronically Signed   By: DLucrezia EuropeM.D.   On: 03/13/2019 20:39

## 2019-04-11 NOTE — Telephone Encounter (Signed)
Appointments scheduled calendar declined he will get update on My Chart per 12/15

## 2019-04-12 DIAGNOSIS — D72819 Decreased white blood cell count, unspecified: Secondary | ICD-10-CM | POA: Diagnosis not present

## 2019-04-12 DIAGNOSIS — D696 Thrombocytopenia, unspecified: Secondary | ICD-10-CM | POA: Diagnosis not present

## 2019-04-12 DIAGNOSIS — M329 Systemic lupus erythematosus, unspecified: Secondary | ICD-10-CM | POA: Diagnosis not present

## 2019-04-12 DIAGNOSIS — M1990 Unspecified osteoarthritis, unspecified site: Secondary | ICD-10-CM | POA: Diagnosis not present

## 2019-05-09 ENCOUNTER — Ambulatory Visit (INDEPENDENT_AMBULATORY_CARE_PROVIDER_SITE_OTHER): Payer: BC Managed Care – PPO | Admitting: Family Medicine

## 2019-05-09 ENCOUNTER — Other Ambulatory Visit: Payer: Self-pay

## 2019-05-09 ENCOUNTER — Encounter: Payer: Self-pay | Admitting: Family Medicine

## 2019-05-09 VITALS — BP 136/82 | HR 109 | Temp 98.6°F | Ht 71.0 in | Wt 175.0 lb

## 2019-05-09 DIAGNOSIS — Z7289 Other problems related to lifestyle: Secondary | ICD-10-CM | POA: Diagnosis not present

## 2019-05-09 DIAGNOSIS — E559 Vitamin D deficiency, unspecified: Secondary | ICD-10-CM | POA: Diagnosis not present

## 2019-05-09 DIAGNOSIS — Z789 Other specified health status: Secondary | ICD-10-CM

## 2019-05-09 DIAGNOSIS — F321 Major depressive disorder, single episode, moderate: Secondary | ICD-10-CM

## 2019-05-09 MED ORDER — DULOXETINE HCL 30 MG PO CPEP: 60.0000 mg | ORAL_CAPSULE | Freq: Every day | ORAL | 5 refills | Status: DC

## 2019-05-09 MED ORDER — VITAMIN D (ERGOCALCIFEROL) 1.25 MG (50000 UNIT) PO CAPS: 50000.0000 [IU] | ORAL_CAPSULE | ORAL | 5 refills | Status: DC

## 2019-05-09 NOTE — Progress Notes (Signed)
Established Patient Office Visit  Subjective:  Patient ID: [name], male    DOB: 01-Aug-1970  Age: 49 y.o. MRN: [id number]  CC:  Chief Complaint  Patient presents with  . Follow-up    3 month f/u no concerns.     HPI ZIER PALANCA presents for follow-up of his depression.  Continues to do well with the Cymbalta.  Mood has been elevated.  He is try to do some things around the house to increase his level of activity.  Tells me that he used to be an avid cyclist but has not felt like doing that until more recently.  He has been out of his vitamin D for a few weeks and needs a refill.  Oncologist has spoken to him about his drinking.  Patient continues to drink some but tells me that he has curtailed it a great deal.  Past Medical History:  Diagnosis Date  . Lupus ([place])     No past surgical history on file.  Family History  Problem Relation Age of Onset  . Cancer Mother     Social History   Socioeconomic History  . Marital status: Single    Spouse name: Not on file  . Number of children: Not on file  . Years of education: Not on file  . Highest education level: Not on file  Occupational History  . Not on file  Tobacco Use  . Smoking status: Never Smoker  . Smokeless tobacco: Never Used  Substance and Sexual Activity  . Alcohol use: Yes    Comment: 2-4 glasses of wine 2-3 times weekly  . Drug use: No  . Sexual activity: Not on file  Other Topics Concern  . Not on file  Social History Narrative  . Not on file   Social Determinants of Health   Financial Resource Strain:   . Difficulty of Paying Living Expenses: Not on file  Food Insecurity:   . Worried About Charity fundraiser in the Last Year: Not on file  . Ran Out of Food in the Last Year: Not on file  Transportation Needs:   . Lack of Transportation (Medical): Not on file  . Lack of Transportation (Non-Medical): Not on file  Physical Activity:   . Days of Exercise per Week: Not on file  .  Minutes of Exercise per Session: Not on file  Stress:   . Feeling of Stress : Not on file  Social Connections:   . Frequency of Communication with Friends and Family: Not on file  . Frequency of Social Gatherings with Friends and Family: Not on file  . Attends Religious Services: Not on file  . Active Member of Clubs or Organizations: Not on file  . Attends Archivist Meetings: Not on file  . Marital Status: Not on file  Intimate Partner Violence:   . Fear of Current or Ex-Partner: Not on file  . Emotionally Abused: Not on file  . Physically Abused: Not on file  . Sexually Abused: Not on file    Outpatient Medications Prior to Visit  Medication Sig Dispense Refill  . azaTHIOprine (IMURAN) 50 MG tablet     . doxycycline (VIBRA-TABS) 100 MG tablet Take 100 mg by mouth 2 (two) times daily.    . hydroxychloroquine (PLAQUENIL) 200 MG tablet Take 200 mg by mouth daily.    Marland Kitchen ibuprofen (ADVIL,MOTRIN) 200 MG tablet Take 200 mg by mouth every 6 (six) hours as needed (pain).    Marland Kitchen  predniSONE (DELTASONE) 20 MG tablet Take 20 mg by mouth daily with breakfast.    . DULoxetine (CYMBALTA) 30 MG capsule Take 2 capsules (60 mg total) by mouth daily. 60 capsule 2  . Vitamin D, Ergocalciferol, (DRISDOL) 1.25 MG (50000 UT) CAPS capsule Take 1 capsule (50,000 Units total) by mouth every 7 (seven) days. 5 capsule 5  . acetaminophen (TYLENOL) 325 MG tablet Take 2 tablets (650 mg total) by mouth every 6 (six) hours as needed for mild pain (or Fever >/= 101). (Patient not taking: Reported on 05/09/2019)    . potassium chloride SA (KLOR-CON) 20 MEQ tablet Take 1 tablet (20 mEq total) by mouth daily. 30 tablet 5   No facility-administered medications prior to visit.    No Known Allergies  ROS Review of Systems  Constitutional: Negative for diaphoresis, fatigue, fever and unexpected weight change.  HENT: Negative.   Eyes: Negative for photophobia and visual disturbance.  Respiratory: Negative.    Cardiovascular: Negative.   Gastrointestinal: Negative.   Endocrine: Negative for polyphagia and polyuria.  Genitourinary: Negative.   Musculoskeletal: Negative for arthralgias and gait problem.  Allergic/Immunologic: Negative for immunocompromised state.  Neurological: Negative for light-headedness and numbness.  Hematological: Does not bruise/bleed easily.   Depression screen [facility] 2/9 05/09/2019 05/09/2019 02/06/2019  Decreased Interest 1 0 3  Down, Depressed, Hopeless 0 0 0  PHQ - 2 Score 1 0 3  Altered sleeping 0 - 1  Tired, decreased energy 1 - 1  Change in appetite 0 - 1  Feeling bad or failure about yourself  0 - 0  Trouble concentrating 0 - 1  Moving slowly or fidgety/restless 0 - 0  Suicidal thoughts 0 - 0  PHQ-9 Score 2 - 7      Objective:    Physical Exam  Constitutional: He is oriented to person, place, and time. He appears well-developed and well-nourished. No distress.  HENT:  Head: Normocephalic and atraumatic.  Right Ear: External ear normal.  Left Ear: External ear normal.  Eyes: Conjunctivae are normal. Right eye exhibits no discharge. Left eye exhibits no discharge. No scleral icterus.  Neck: No JVD present. No tracheal deviation present.  Cardiovascular: Normal rate, regular rhythm and normal heart sounds.  Pulmonary/Chest: Effort normal and breath sounds normal. No stridor.  Neurological: He is alert and oriented to person, place, and time.  Skin: Skin is warm and dry. He is not diaphoretic.  Psychiatric: He has a normal mood and affect. His behavior is normal.    BP 136/82   Pulse (!) 109   Temp 98.6 F (37 C) (Tympanic)   Ht 5\' 11"  (1.803 m)   Wt 175 lb (79.4 kg)   SpO2 97%   BMI 24.41 kg/m  Wt Readings from Last 3 Encounters:  05/09/19 175 lb (79.4 kg)  04/11/19 171 lb (77.6 kg)  03/07/19 170 lb (77.1 kg)     There are no preventive care reminders to display for this patient.  There are no preventive care reminders to display for this  patient.  Lab Results  Component Value Date   TSH 6.32 (H) 02/06/2019   Lab Results  Component Value Date   WBC 1.6 (L) 04/11/2019   HGB 12.8 (L) 04/11/2019   HCT 37.5 (L) 04/11/2019   MCV 89.9 04/11/2019   PLT 157 04/11/2019   Lab Results  Component Value Date   NA 138 04/11/2019   K 4.3 04/11/2019   CO2 31 04/11/2019   GLUCOSE 88 04/11/2019  BUN 16 04/11/2019   CREATININE 0.69 04/11/2019   BILITOT 0.5 04/11/2019   ALKPHOS 105 04/11/2019   AST 31 04/11/2019   ALT 14 04/11/2019   PROT 6.3 (L) 04/11/2019   ALBUMIN 2.6 (L) 04/11/2019   CALCIUM 8.2 (L) 04/11/2019   ANIONGAP 4 (L) 04/11/2019   GFR 112.24 02/06/2019   Lab Results  Component Value Date   CHOL 85 11/01/2018   Lab Results  Component Value Date   HDL 16.20 (L) 11/01/2018   Lab Results  Component Value Date   LDLCALC 44 11/01/2018   Lab Results  Component Value Date   TRIG 126.0 11/01/2018   Lab Results  Component Value Date   CHOLHDL 5 11/01/2018   No results found for: HGBA1C    Assessment & Plan:   Problem List Items Addressed This Visit      Other   Depression, major, single episode, moderate (HCC) - Primary   Relevant Medications   DULoxetine (CYMBALTA) 30 MG capsule   Vitamin D deficiency   Relevant Medications   Vitamin D, Ergocalciferol, (DRISDOL) 1.25 MG (50000 UNIT) CAPS capsule   Other Relevant [facility]   VITAMIN D 25 Hydroxy (Vit-D Deficiency, Fractures)   Alcohol use      Meds ordered this encounter  Medications  . DULoxetine (CYMBALTA) 30 MG capsule    Sig: Take 2 capsules (60 mg total) by mouth daily.    Dispense:  60 capsule    Refill:  5  . Vitamin D, Ergocalciferol, (DRISDOL) 1.25 MG (50000 UNIT) CAPS capsule    Sig: Take 1 capsule (50,000 Units total) by mouth every 7 (seven) days.    Dispense:  5 capsule    Refill:  5    Follow-up: Return in about 6 months (around 11/06/2019), or return in a few months for repeat vit d. It would be best to stop drinking  entirely.[name], MD

## 2019-07-17 ENCOUNTER — Encounter: Payer: Self-pay | Admitting: Family Medicine

## 2019-10-10 ENCOUNTER — Ambulatory Visit: Payer: BC Managed Care – PPO | Admitting: Hematology

## 2019-10-10 ENCOUNTER — Inpatient Hospital Stay: Payer: BC Managed Care – PPO | Admitting: Family

## 2019-10-10 ENCOUNTER — Inpatient Hospital Stay: Payer: BC Managed Care – PPO | Attending: Hematology & Oncology

## 2019-10-10 ENCOUNTER — Other Ambulatory Visit: Payer: BC Managed Care – PPO

## 2019-11-06 ENCOUNTER — Other Ambulatory Visit: Payer: Self-pay

## 2019-11-06 ENCOUNTER — Ambulatory Visit (INDEPENDENT_AMBULATORY_CARE_PROVIDER_SITE_OTHER): Payer: BC Managed Care – PPO | Admitting: Family Medicine

## 2019-11-06 ENCOUNTER — Encounter: Payer: Self-pay | Admitting: Family Medicine

## 2019-11-06 VITALS — BP 162/90 | HR 103 | Temp 97.6°F | Ht 71.0 in | Wt 193.2 lb

## 2019-11-06 DIAGNOSIS — Z7289 Other problems related to lifestyle: Secondary | ICD-10-CM

## 2019-11-06 DIAGNOSIS — R7989 Other specified abnormal findings of blood chemistry: Secondary | ICD-10-CM | POA: Diagnosis not present

## 2019-11-06 DIAGNOSIS — I5022 Chronic systolic (congestive) heart failure: Secondary | ICD-10-CM | POA: Insufficient documentation

## 2019-11-06 DIAGNOSIS — I509 Heart failure, unspecified: Secondary | ICD-10-CM | POA: Diagnosis not present

## 2019-11-06 DIAGNOSIS — E039 Hypothyroidism, unspecified: Secondary | ICD-10-CM

## 2019-11-06 DIAGNOSIS — Z789 Other specified health status: Secondary | ICD-10-CM

## 2019-11-06 DIAGNOSIS — F321 Major depressive disorder, single episode, moderate: Secondary | ICD-10-CM

## 2019-11-06 DIAGNOSIS — E559 Vitamin D deficiency, unspecified: Secondary | ICD-10-CM | POA: Diagnosis not present

## 2019-11-06 DIAGNOSIS — M3213 Lung involvement in systemic lupus erythematosus: Secondary | ICD-10-CM

## 2019-11-06 DIAGNOSIS — E538 Deficiency of other specified B group vitamins: Secondary | ICD-10-CM

## 2019-11-06 DIAGNOSIS — I5020 Unspecified systolic (congestive) heart failure: Secondary | ICD-10-CM | POA: Insufficient documentation

## 2019-11-06 MED ORDER — POTASSIUM CHLORIDE CRYS ER 20 MEQ PO TBCR: 20.0000 meq | EXTENDED_RELEASE_TABLET | Freq: Every day | ORAL | 3 refills | Status: DC

## 2019-11-06 MED ORDER — FUROSEMIDE 20 MG PO TABS: 20.0000 mg | ORAL_TABLET | Freq: Two times a day (BID) | ORAL | 1 refills | Status: DC

## 2019-11-06 NOTE — Progress Notes (Addendum)
He has not   Established Patient Office Visit  Subjective:  Patient ID: [name], male    DOB: 06-20-1970  Age: 49 y.o. MRN: [id number]  CC:  Chief Complaint  Patient presents with  . Follow-up    6 month follow up, patient states that legs are swollen x 6 weeks.     HPI [name] presents for evaluation and treatment ongoing dyspnea on exertion, shortness of breath, swelling in his legs, abdominal bloating.  Denies chest pain, diaphoresis nausea or vomiting.  Self discontinued medicines for lupus and depression.  Admits to drinking 2 to 3 glasses of wine 3 to 4 days weekly.  His only thing that seems to help with his pain.  History of CHF associated with lupus.  Lives alone.  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  Past Medical History:  Diagnosis Date  . Lupus ([place])     History reviewed. No pertinent surgical history.  Family History  Problem Relation Age of Onset  . Cancer Mother     Social History   Socioeconomic History  . Marital status: Single    Spouse name: Not on file  . Number of children: Not on file  . Years of education: Not on file  . Highest education level: Not on file  Occupational History  . Not on file  Tobacco Use  . Smoking status: Never Smoker  . Smokeless tobacco: Never Used  Vaping Use  . Vaping Use: Never used  Substance and Sexual Activity  . Alcohol use: Yes    Comment: 2-4 glasses of wine 2-3 times weekly  . Drug use: No  . Sexual activity: Not on file  Other Topics Concern  . Not on file  Social History Narrative  . Not on file   Social Determinants of Health   Financial Resource Strain:   . Difficulty of Paying Living Expenses:   Food Insecurity:   . Worried About Charity fundraiser in the Last Year:   . Arboriculturist in the Last Year:   Transportation Needs:   . Film/video editor (Medical):   Marland Kitchen Lack of Transportation (Non-Medical):   Physical Activity:   . Days of Exercise per Week:   . Minutes of  Exercise per Session:   Stress:   . Feeling of Stress :   Social Connections:   . Frequency of Communication with Friends and Family:   . Frequency of Social Gatherings with Friends and Family:   . Attends Religious Services:   . Active Member of Clubs or Organizations:   . Attends Archivist Meetings:   Marland Kitchen Marital Status:   Intimate Partner Violence:   . Fear of Current or Ex-Partner:   . Emotionally Abused:   Marland Kitchen Physically Abused:   . Sexually Abused:     Outpatient Medications Prior to Visit  Medication Sig Dispense Refill  . ibuprofen (ADVIL,MOTRIN) 200 MG tablet Take 200 mg by mouth every 6 (six) hours as needed (pain).    Marland Kitchen acetaminophen (TYLENOL) 325 MG tablet Take 2 tablets (650 mg total) by mouth every 6 (six) hours as needed for mild pain (or Fever >/= 101). (Patient not taking: Reported on 05/09/2019)    . azaTHIOprine (IMURAN) 50 MG tablet  (Patient not taking: Reported on 11/06/2019)    . doxycycline (VIBRA-TABS) 100 MG tablet Take 100 mg by mouth 2 (two) times daily. (Patient not taking: Reported on 11/06/2019)    . DULoxetine (CYMBALTA) 30  MG capsule Take 2 capsules (60 mg total) by mouth daily. (Patient not taking: Reported on 11/06/2019) 60 capsule 5  . hydroxychloroquine (PLAQUENIL) 200 MG tablet Take 200 mg by mouth daily. (Patient not taking: Reported on 11/06/2019)    . potassium chloride SA (KLOR-CON) 20 MEQ tablet Take 1 tablet (20 mEq total) by mouth daily. 30 tablet 5  . predniSONE (DELTASONE) 20 MG tablet Take 20 mg by mouth daily with breakfast. (Patient not taking: Reported on 11/06/2019)    . Vitamin D, Ergocalciferol, (DRISDOL) 1.25 MG (50000 UNIT) CAPS capsule Take 1 capsule (50,000 Units total) by mouth every 7 (seven) days. (Patient not taking: Reported on 11/06/2019) 5 capsule 5   No facility-administered medications prior to visit.    No Known Allergies  ROS Review of Systems  Constitutional: Positive for unexpected weight change. Negative for  chills, diaphoresis, fatigue and fever.  HENT: Negative.   Eyes: Negative for photophobia and visual disturbance.  Respiratory: Positive for shortness of breath. Negative for choking and wheezing.   Cardiovascular: Positive for leg swelling. Negative for chest pain and palpitations.  Gastrointestinal: Negative for nausea and vomiting.  Endocrine: Negative for polyphagia and polyuria.  Genitourinary: Negative.   Musculoskeletal: Negative for gait problem and joint swelling.  Skin: Negative for pallor and wound.  Allergic/Immunologic: Negative for immunocompromised state.  Neurological: Negative for tremors, speech difficulty and light-headedness.   Depression screen [facility] 2/9 11/06/2019 05/09/2019 05/09/2019  Decreased Interest 1 1 0  Down, Depressed, Hopeless 0 0 0  PHQ - 2 Score 1 1 0  Altered sleeping 0 0 -  Tired, decreased energy 1 1 -  Change in appetite 1 0 -  Feeling bad or failure about yourself  0 0 -  Trouble concentrating 0 0 -  Moving slowly or fidgety/restless 0 0 -  Suicidal thoughts 0 0 -  PHQ-9 Score 3 2 -  Difficult doing work/chores Not difficult at all - -      Objective:    Physical Exam Vitals and nursing note reviewed.  Constitutional:      General: He is not in acute distress.    Appearance: Normal appearance. He is not ill-appearing, toxic-appearing or diaphoretic.  HENT:     Head: Normocephalic and atraumatic.     Right Ear: External ear normal.     Left Ear: External ear normal.  Eyes:     General: No scleral icterus.       Right eye: No discharge.        Left eye: No discharge.     Conjunctiva/sclera: Conjunctivae normal.     Pupils: Pupils are equal, round, and reactive to light.  Cardiovascular:     Rate and Rhythm: Regular rhythm. Tachycardia present.     Heart sounds: Gallop present.   Pulmonary:     Effort: Pulmonary effort is normal.     Breath sounds: Rhonchi present.  Abdominal:     General: There is no distension.     Palpations:  There is no mass.     Tenderness: There is no abdominal tenderness. There is no guarding or rebound.     Hernia: No hernia is present.     Comments: Without hepatojugular venous response.   Musculoskeletal:     Cervical back: No rigidity or tenderness.     Right lower leg: 2+ Edema present.     Left lower leg: 3+ Edema present.  Lymphadenopathy:     Cervical: No cervical adenopathy.  Skin:  General: Skin is warm and dry.     Coloration: Skin is not jaundiced.  Neurological:     Mental Status: He is alert and oriented to person, place, and time.  Psychiatric:        Mood and Affect: Mood normal.        Behavior: Behavior normal.     BP (!) 162/90   Pulse (!) 103   Temp 97.6 F (36.4 C) (Tympanic)   Ht 5\' 11"  (1.803 m)   Wt 193 lb 3.2 oz (87.6 kg)   SpO2 94%   BMI 26.95 kg/m  Wt Readings from Last 3 Encounters:  11/06/19 193 lb 3.2 oz (87.6 kg)  05/09/19 175 lb (79.4 kg)  04/11/19 171 lb (77.6 kg)     Health Maintenance Due  Topic Date Due  . COVID-19 Vaccine (1) Never done  . COLONOSCOPY  Never done    There are no preventive care reminders to display for this patient.  Lab Results  Component Value Date   TSH 9.75 (H) 11/06/2019   Lab Results  Component Value Date   WBC 2.3 Repeated and verified X2. (L) 11/06/2019   HGB 12.7 (L) 11/06/2019   HCT 38.2 (L) 11/06/2019   MCV 93.2 11/06/2019   PLT 165.0 11/06/2019   Lab Results  Component Value Date   NA 137 11/06/2019   K 4.0 11/06/2019   CO2 25 11/06/2019   GLUCOSE 80 11/06/2019   BUN 15 11/06/2019   CREATININE 0.79 11/06/2019   BILITOT 0.2 11/06/2019   ALKPHOS 104 11/06/2019   AST 56 (H) 11/06/2019   ALT 27 11/06/2019   PROT 5.5 (L) 11/06/2019   ALBUMIN 1.7 (L) 11/06/2019   CALCIUM 7.0 (L) 11/06/2019   ANIONGAP 4 (L) 04/11/2019   GFR 103.76 11/06/2019   Lab Results  Component Value Date   CHOL 85 11/01/2018   Lab Results  Component Value Date   HDL 16.20 (L) 11/01/2018   Lab Results    Component Value Date   LDLCALC 44 11/01/2018   Lab Results  Component Value Date   TRIG 126.0 11/01/2018   Lab Results  Component Value Date   CHOLHDL 5 11/01/2018   No results found for: HGBA1C    Assessment & Plan:   Problem List Items Addressed This Visit      Cardiovascular and Mediastinum   Congestive heart failure (HCC)   Relevant Medications   furosemide (LASIX) 20 MG tablet   potassium chloride SA (KLOR-CON) 20 MEQ tablet   Other Relevant Orders   Ambulatory referral to Cardiology   CBC (Completed)   Comprehensive metabolic panel (Completed)   B Nat Peptide (Completed)     Respiratory   Systemic lupus erythematosus with lung involvement (HCC)     Other   Depression, major, single episode, moderate (HCC)   Vitamin D deficiency   Relevant Medications   Vitamin D, Ergocalciferol, (DRISDOL) 1.25 MG (50000 UNIT) CAPS capsule   Other Relevant Orders   VITAMIN D 25 Hydroxy (Vit-D Deficiency, Fractures) (Completed)   Alcohol use - Primary   Relevant Orders   CBC (Completed)   Comprehensive metabolic panel (Completed)   Gamma GT (Completed)   B12 and Folate Panel (Completed)   Vitamin B1 (Completed)   Elevated TSH   Relevant Medications   levothyroxine (SYNTHROID) 50 MCG tablet   Other Relevant Orders   TSH (Completed)    Other Visit Diagnoses    Folate deficiency       Relevant  Medications   folic acid (FOLVITE) 1 MG tablet   Acquired hypothyroidism       Relevant Medications   levothyroxine (SYNTHROID) 50 MCG tablet      Meds ordered this encounter  Medications  . furosemide (LASIX) 20 MG tablet    Sig: Take 1 tablet (20 mg total) by mouth 2 (two) times daily.    Dispense:  60 tablet    Refill:  1  . potassium chloride SA (KLOR-CON) 20 MEQ tablet    Sig: Take 1 tablet (20 mEq total) by mouth daily.    Dispense:  30 tablet    Refill:  3  . Vitamin D, Ergocalciferol, (DRISDOL) 1.25 MG (50000 UNIT) CAPS capsule    Sig: Take 1 capsule (50,000  Units total) by mouth every 7 (seven) days.    Dispense:  5 capsule    Refill:  5  . folic acid (FOLVITE) 1 MG tablet    Sig: Take 1 tablet (1 mg total) by mouth daily.    Dispense:  90 tablet    Refill:  0  . levothyroxine (SYNTHROID) 50 MCG tablet    Sig: Take 1 tablet (50 mcg total) by mouth daily before breakfast.    Dispense:  90 tablet    Refill:  0    Follow-up: Return in about 3 weeks (around 11/27/2019).    [name], MD

## 2019-11-06 NOTE — Patient Instructions (Signed)
Heart Failure, Self Care Heart failure is a serious condition. This document explains the things you need to do to take care of yourself after a heart failure diagnosis. You may be asked to change your diet, take certain medicines, and make other lifestyle changes in order to stay as healthy as possible. Your health care provider may also give you more specific instructions. If you have problems or questions, contact your health care provider. What are the risks? Having heart failure puts you at higher risk for certain problems. These problems can get worse if you do not take good care of yourself. Problems may include:  Blood clotting problems. This may cause a stroke.  Damage to the kidneys, liver, or lungs.  Abnormal heart rhythms. Supplies needed:  Scale for monitoring weight.  Blood pressure monitor.  Notebook.  Medicines. How to care for yourself when you have heart failure Medicines Take over-the-counter and prescription medicines only as told by your health care provider. Medicines reduce the workload of your heart, slow the progression of heart failure, and improve symptoms. Take your medicines every day.  Do not stop taking your medicine unless your health care provider tells you to do so.  Do not skip any dose of medicine.  Refill your prescriptions before you run out of medicine. Eating and drinking   Eat heart-healthy foods. Talk with a dietitian to make an eating plan that is right for you. ? Choose foods that contain no trans fat and are low in saturated fat and cholesterol. Healthy choices include fresh or frozen fruits and vegetables, fish, lean meats, legumes, fat-free or low-fat dairy products, and whole-grain or high-fiber foods. ? Limit salt (sodium) if told by your health care provider. Sodium restriction may reduce symptoms of heart failure. Ask a dietitian to recommend heart-healthy seasonings. ? Use healthy cooking methods instead of frying. Healthy methods  include roasting, grilling, broiling, baking, poaching, steaming, and stir-frying.  Limit your fluid intake, if directed by your health care provider. Fluid restriction may reduce symptoms of heart failure. Alcohol use  Do not drink alcohol if: ? Your health care provider tells you not to drink. ? Your heart was damaged by alcohol, or you have severe heart failure. ? You are pregnant, may be pregnant, or are planning to become pregnant.  If you drink alcohol: ? Limit how much you use to:  0-1 drink a day for women.  0-2 drinks a day for men. ? Be aware of how much alcohol is in your drink. In the [place] one drink equals one 12 oz bottle of beer (355 mL), one 5 oz glass of wine (148 mL), or one 1 oz glass of hard liquor (44 mL). Lifestyle   Do not use any products that contain nicotine or tobacco, such as cigarettes, e-cigarettes, and chewing tobacco. If you need help quitting, ask your health care provider. ? Do not use nicotine gum or patches before talking to your health care provider.  Do not use illegal drugs.  Work with your health care provider to safely reach the right body weight.  Do physical activity if told by your health care provider. Talk to your health care provider before you begin an exercise if: ? You are an older adult. ? You have severe heart failure.  Learn to manage stress. If you need help to do this, ask your health care provider.  Participate in or seek rehabilitation as needed to keep or improve your independence and quality of life.  Plan  rest periods when you get tired. Monitoring important information   Weigh yourself every day. This will help you to notice if too much fluid is building up in your body. ? Weigh yourself every morning after you urinate and before you eat breakfast. ? Wear the same amount of clothing each time you weigh yourself. ? Record your daily weight. Provide your health care provider with your weight record.  Monitor and  record your pulse and blood pressure as told by your health care provider. Dealing with extreme temperatures  If the weather is extremely hot: ? Avoid vigorous physical activity. ? Use air conditioning or fans, or find a cooler location. ? Avoid caffeine and alcohol. ? Wear loose-fitting, lightweight, and light-colored clothing.  If the weather is extremely cold: ? Avoid vigorous activity. ? Layer your clothes. ? Wear mittens or gloves, a hat, and a scarf when you go outside. ? Avoid alcohol. Follow these instructions at home:  Stay up to date with vaccines. Pneumococcal and flu (influenza) vaccines are especially important in preventing infections of the airways.  Keep all follow-up visits as told by your health care provider. This is important. Contact a health care provider if you:  Have a rapid weight gain.  Have increasing shortness of breath.  Are unable to participate in your usual physical activities.  Get tired easily.  Cough more than normal, especially with physical activity.  Lose your appetite or feel nauseous.  Have any swelling or more swelling in areas such as your hands, feet, ankles, or abdomen.  Are unable to sleep because it is hard to breathe.  Feel like your heart is beating quickly (palpitations).  Become dizzy or light-headed when you stand up. Get help right away if you:  Have trouble breathing.  Notice or your family notices a change in your awareness, such as having trouble staying awake or concentrating.  Have pain or discomfort in your chest.  Have an episode of fainting (syncope). These symptoms may represent a serious problem that is an emergency. Do not wait to see if the symptoms will go away. Get medical help right away. Call your local emergency services (911 in the [place] Do not drive yourself to the hospital. Summary  Heart failure is a serious condition. To care for yourself, you may be asked to change your diet, take certain  medicines, and make other lifestyle changes.  Take your medicines every day. Do not stop taking them unless your health care provider tells you to do so.  Eat heart-healthy foods, such as fresh or frozen fruits and vegetables, fish, lean meats, legumes, fat-free or low-fat dairy products, and whole-grain or high-fiber foods.  Ask your health care provider if you have any alcohol restrictions. You may have to stop drinking alcohol if you have severe heart failure.  Contact your health care provider if you notice problems, such as rapid weight gain or a fast heartbeat. Get help right away if you faint, or have chest pain or trouble breathing. This information is not intended to replace advice given to you by your health care provider. Make sure you discuss any questions you have with your health care provider. Document Revised: 07/26/2018 Document Reviewed: 07/27/2018 Elsevier Patient Education  [place].

## 2019-11-07 LAB — COMPREHENSIVE METABOLIC PANEL
ALT: 27 U/L (ref 0–53)
AST: 56 U/L — ABNORMAL HIGH (ref 0–37)
Albumin: 1.7 g/dL — ABNORMAL LOW (ref 3.5–5.2)
Alkaline Phosphatase: 104 U/L (ref 39–117)
BUN: 15 mg/dL (ref 6–23)
CO2: 25 mEq/L (ref 19–32)
Calcium: 7 mg/dL — ABNORMAL LOW (ref 8.4–10.5)
Chloride: 108 mEq/L (ref 96–112)
Creatinine, Ser: 0.79 mg/dL (ref 0.40–1.50)
GFR: 103.76 mL/min (ref >60.00)
Glucose, Bld: 80 mg/dL (ref 70–99)
Potassium: 4 mEq/L (ref 3.5–5.1)
Sodium: 137 mEq/L (ref 135–145)
Total Bilirubin: 0.2 mg/dL (ref 0.2–1.2)
Total Protein: 5.5 g/dL — ABNORMAL LOW (ref 6.0–8.3)

## 2019-11-07 LAB — BRAIN NATRIURETIC PEPTIDE: Pro B Natriuretic peptide (BNP): 337 pg/mL — ABNORMAL HIGH (ref 0.0–100.0)

## 2019-11-07 LAB — CBC
HCT: 38.2 % — ABNORMAL LOW (ref 39.0–52.0)
Hemoglobin: 12.7 g/dL — ABNORMAL LOW (ref 13.0–17.0)
MCHC: 33.1 g/dL (ref 30.0–36.0)
MCV: 93.2 fl (ref 78.0–100.0)
Platelets: 165 10*3/uL (ref 150.0–400.0)
RBC: 4.1 Mil/uL — ABNORMAL LOW (ref 4.22–5.81)
RDW: 16 % — ABNORMAL HIGH (ref 11.5–15.5)
WBC: 2.3 10*3/uL — ABNORMAL LOW (ref 4.0–10.5)

## 2019-11-07 LAB — TSH: TSH: 9.75 u[IU]/mL — ABNORMAL HIGH (ref 0.35–4.50)

## 2019-11-07 LAB — B12 AND FOLATE PANEL
Folate: 4.8 ng/mL — ABNORMAL LOW (ref >5.9)
Vitamin B-12: 495 pg/mL (ref 211–911)

## 2019-11-07 LAB — GAMMA GT: GGT: 31 U/L (ref 7–51)

## 2019-11-07 LAB — VITAMIN D 25 HYDROXY (VIT D DEFICIENCY, FRACTURES): VITD: <7 ng/mL — ABNORMAL LOW (ref 30.00–100.00)

## 2019-11-11 ENCOUNTER — Encounter: Payer: Self-pay | Admitting: Family Medicine

## 2019-11-11 DIAGNOSIS — I509 Heart failure, unspecified: Secondary | ICD-10-CM

## 2019-11-11 LAB — VITAMIN B1: Vitamin B1 (Thiamine): 10 nmol/L (ref 8–30)

## 2019-11-12 MED ORDER — VITAMIN D (ERGOCALCIFEROL) 1.25 MG (50000 UNIT) PO CAPS: 50000.0000 [IU] | ORAL_CAPSULE | ORAL | 5 refills | Status: DC

## 2019-11-12 MED ORDER — LEVOTHYROXINE SODIUM 50 MCG PO TABS: 50.0000 ug | ORAL_TABLET | Freq: Every day | ORAL | 0 refills | Status: DC

## 2019-11-12 MED ORDER — FOLIC ACID 1 MG PO TABS: 1.0000 mg | ORAL_TABLET | Freq: Every day | ORAL | 0 refills | Status: DC

## 2019-11-12 NOTE — Addendum Note (Signed)
Addended by: Jon Billings on: 11/12/2019 08:20 PM   Modules accepted: Orders

## 2019-11-13 MED ORDER — FUROSEMIDE 40 MG PO TABS: ORAL_TABLET | ORAL | 1 refills | Status: DC

## 2019-11-13 NOTE — Telephone Encounter (Signed)
Increase lasix to 40mg  in the morning and 20mg  in the late afternoon.

## 2019-11-13 NOTE — Telephone Encounter (Signed)
Please follow up also with Rheumatologist to prevent this from happening.

## 2019-11-24 ENCOUNTER — Other Ambulatory Visit: Payer: Self-pay

## 2019-11-24 ENCOUNTER — Encounter: Payer: Self-pay | Admitting: *Deleted

## 2019-11-24 ENCOUNTER — Ambulatory Visit: Payer: BC Managed Care – PPO | Admitting: Cardiology

## 2019-11-24 ENCOUNTER — Encounter: Payer: Self-pay | Admitting: Cardiology

## 2019-11-24 VITALS — BP 148/84 | HR 76 | Ht 71.5 in | Wt 178.0 lb

## 2019-11-24 DIAGNOSIS — M328 Other forms of systemic lupus erythematosus: Secondary | ICD-10-CM | POA: Diagnosis not present

## 2019-11-24 DIAGNOSIS — I5020 Unspecified systolic (congestive) heart failure: Secondary | ICD-10-CM | POA: Diagnosis not present

## 2019-11-24 DIAGNOSIS — Z7289 Other problems related to lifestyle: Secondary | ICD-10-CM

## 2019-11-24 DIAGNOSIS — Z789 Other specified health status: Secondary | ICD-10-CM

## 2019-11-24 DIAGNOSIS — R0609 Other forms of dyspnea: Secondary | ICD-10-CM

## 2019-11-24 DIAGNOSIS — R0600 Dyspnea, unspecified: Secondary | ICD-10-CM

## 2019-11-24 DIAGNOSIS — R011 Cardiac murmur, unspecified: Secondary | ICD-10-CM | POA: Insufficient documentation

## 2019-11-24 NOTE — Patient Instructions (Signed)
Medication Instructions:  Your physician recommends that you continue on your current medications as directed. Please refer to the Current Medication list given to you today.  *If you need a refill on your cardiac medications before your next appointment, please call your pharmacy*   Lab Work: None ordered  If you have labs (blood work) drawn today and your tests are completely normal, you will receive your results only by:  [place] (if you have MyChart) OR  A paper copy in the mail If you have any lab test that is abnormal or we need to change your treatment, we will call you to review the results.   Testing/Procedures: Your physician has requested that you have an echocardiogram. Echocardiography is a painless test that uses sound waves to create images of your heart. It provides your doctor with information about the size and shape of your heart and how well your hearts chambers and valves are working. This procedure takes approximately one hour. There are no restrictions for this procedure.   Your physician has requested that you have a lexiscan myoview. For further information please visit [email or web address]. Please follow instruction sheet, as given.     Follow-Up: At [facility], you and your health needs are our priority.  As part of our continuing mission to provide you with exceptional heart care, we have created designated Provider Care Teams.  These Care Teams include your primary Cardiologist (physician) and Advanced Practice Providers (APPs -  Physician Assistants and Nurse Practitioners) who all work together to provide you with the care you need, when you need it.  We recommend signing up for the patient portal called "MyChart".  Sign up information is provided on this After Visit Summary.  MyChart is used to connect with patients for Virtual Visits (Telemedicine).  Patients are able to view lab/test results, encounter notes, upcoming appointments, etc.   Non-urgent messages can be sent to your provider as well.   To learn more about what you can do with MyChart, go to [email or web address].    Your next appointment:   2 month(s)  The format for your next appointment:   In Person  Provider:   Jyl Heinz, MD   Other Instructions  Echocardiogram An echocardiogram is a procedure that uses painless sound waves (ultrasound) to produce an image of the heart. Images from an echocardiogram can provide important information about:  Signs of coronary artery disease (CAD).  Aneurysm detection. An aneurysm is a weak or damaged part of an artery wall that bulges out from the normal force of blood pumping through the body.  Heart size and shape. Changes in the size or shape of the heart can be associated with certain conditions, including heart failure, aneurysm, and CAD.  Heart muscle function.  Heart valve function.  Signs of a past heart attack.  Fluid buildup around the heart.  Thickening of the heart muscle.  A tumor or infectious growth around the heart valves. Tell a health care provider about:  Any allergies you have.  All medicines you are taking, including vitamins, herbs, eye drops, creams, and over-the-counter medicines.  Any blood disorders you have.  Any surgeries you have had.  Any medical conditions you have.  Whether you are pregnant or may be pregnant. What are the risks? Generally, this is a safe procedure. However, problems may occur, including:  Allergic reaction to dye (contrast) that may be used during the procedure. What happens before the procedure? No specific preparation is needed. You may eat  and drink normally. What happens during the procedure?   An IV tube may be inserted into one of your veins.  You may receive contrast through this tube. A contrast is an injection that improves the quality of the pictures from your heart.  A gel will be applied to your chest.  A wand-like tool  (transducer) will be moved over your chest. The gel will help to transmit the sound waves from the transducer.  The sound waves will harmlessly bounce off of your heart to allow the heart images to be captured in real-time motion. The images will be recorded on a computer. The procedure may vary among health care providers and hospitals. What happens after the procedure?  You may return to your normal, everyday life, including diet, activities, and medicines, unless your health care provider tells you not to do that. Summary  An echocardiogram is a procedure that uses painless sound waves (ultrasound) to produce an image of the heart.  Images from an echocardiogram can provide important information about the size and shape of your heart, heart muscle function, heart valve function, and fluid buildup around your heart.  You do not need to do anything to prepare before this procedure. You may eat and drink normally.  After the echocardiogram is completed, you may return to your normal, everyday life, unless your health care provider tells you not to do that. This information is not intended to replace advice given to you by your health care provider. Make sure you discuss any questions you have with your health care provider. Document Revised: 08/04/2018 Document Reviewed: 05/16/2016 Elsevier Patient Education  2020 [place].     Cardiac Nuclear Scan A cardiac nuclear scan is a test that is done to check the flow of blood to your heart. It is done when you are resting and when you are exercising. The test looks for problems such as:  Not enough blood reaching a portion of the heart.  The heart muscle not working as it should. You may need this test if:  You have heart disease.  You have had lab results that are not normal.  You have had heart surgery or a balloon procedure to open up blocked arteries (angioplasty).  You have chest pain.  You have shortness of breath. In  this test, a special dye (tracer) is put into your bloodstream. The tracer will travel to your heart. A camera will then take pictures of your heart to see how the tracer moves through your heart. This test is usually done at a hospital and takes 2-4 hours. Tell a doctor about:  Any allergies you have.  All medicines you are taking, including vitamins, herbs, eye drops, creams, and over-the-counter medicines.  Any problems you or family members have had with anesthetic medicines.  Any blood disorders you have.  Any surgeries you have had.  Any medical conditions you have.  Whether you are pregnant or may be pregnant. What are the risks? Generally, this is a safe test. However, problems may occur, such as:  Serious chest pain and heart attack. This is only a risk if the stress portion of the test is done.  Rapid heartbeat.  A feeling of warmth in your chest. This feeling usually does not last long.  Allergic reaction to the tracer. What happens before the test?  Ask your doctor about changing or stopping your normal medicines. This is important.  Follow instructions from your doctor about what you cannot eat or drink.  Remove your jewelry on the day of the test. What happens during the test?  An IV tube will be inserted into one of your veins.  Your doctor will give you a small amount of tracer through the IV tube.  You will wait for 20-40 minutes while the tracer moves through your bloodstream.  Your heart will be monitored with an electrocardiogram (ECG).  You will lie down on an exam table.  Pictures of your heart will be taken for about 15-20 minutes.  You may also have a stress test. For this test, one of these things may be done: ? You will be asked to exercise on a treadmill or a stationary bike. ? You will be given medicines that will make your heart work harder. This is done if you are unable to exercise.  When blood flow to your heart has peaked, a tracer  will again be given through the IV tube.  After 20-40 minutes, you will get back on the exam table. More pictures will be taken of your heart.  Depending on the tracer that is used, more pictures may need to be taken 3-4 hours later.  Your IV tube will be removed when the test is over. The test may vary among doctors and hospitals. What happens after the test?  Ask your doctor: ? Whether you can return to your normal schedule, including diet, activities, and medicines. ? Whether you should drink more fluids. This will help to remove the tracer from your body. Drink enough fluid to keep your pee (urine) pale yellow.  Ask your doctor, or the department that is doing the test: ? When will my results be ready? ? How will I get my results? Summary  A cardiac nuclear scan is a test that is done to check the flow of blood to your heart.  Tell your doctor whether you are pregnant or may be pregnant.  Before the test, ask your doctor about changing or stopping your normal medicines. This is important.  Ask your doctor whether you can return to your normal activities. You may be asked to drink more fluids. This information is not intended to replace advice given to you by your health care provider. Make sure you discuss any questions you have with your health care provider. Document Revised: 08/03/2018 Document Reviewed: 09/27/2017 Elsevier Patient Education  [place].

## 2019-11-24 NOTE — Progress Notes (Signed)
Cardiology Office Note:    Date:  11/24/2019   ID:  [name], DOB 1970/07/01, MRN [id number]  PCP:  Angel Maw, MD  Cardiologist:  Jenean Lindau, MD   Referring MD: [name],*    ASSESSMENT:    1. Systolic congestive heart failure, unspecified HF chronicity ([place])   2. Alcohol use   3. Other forms of systemic lupus erythematosus, unspecified organ involvement status ([place])   4. Dyspnea on exertion    PLAN:    In order of problems listed above:  1. Primary prevention stressed with the patient.  Importance of compliance with diet medication stressed and he vocalized understanding. 2. Congestive heart failure and dyspnea on exertion: The symptoms are concerning.  He has responded well to diuretic therapy.  In view of this I discussed with him about salt intake issues.  Also echocardiogram will be done to assess this and normal heart and auscultation. 3. Dyspnea on exertion: Lexiscan sestamibi will be done to assess the symptom profile.  He has multiple risk factors for coronary artery disease. 4.  I discussed with him about reducing alcohol use significantly and subsequently quitting alcohol use altogether.  Risks were explained and he vocalized understanding and promised to do so. 5.  Patient will be seen in follow-up appointment in 6 weeks or earlier if the patient has any concerns  Medication Adjustments/Labs and Tests Ordered: Current medicines are reviewed at length with the patient today.  Concerns regarding medicines are outlined above.  No orders of the defined types were placed in this encounter.  No orders of the defined types were placed in this encounter.    History of Present Illness:    [name] is a 49 y.o. male who is being seen today for the evaluation of congestive heart failure and dyspnea on exertion at the request of [name],*.  Patient is a pleasant 48 year old male.  He medical history of systemic lupus  erythematosus.  He uses alcohol in a significant fashion.  He has been noticing shortness of breath on exertion and also when lying down.  He gives history of orthopnea.  Patient mentions to me with diuretic therapy symptoms have gotten significantly better.  No chest pain orthopnea.  At this time.  These have improved.  He has bilateral pedal edema.  At the time of my evaluation, the patient is alert awake oriented and in no distress.  He occasionally complains of chest discomfort which is stabbing-like sensation unrelated to exertion.  He leads a sedentary lifestyle.  He does not smoke.  No history of hypertension dyslipidemia or diabetes mellitus.  Past Medical History:  Diagnosis Date  . Lupus ([place])     History reviewed. No pertinent surgical history.  Current Medications: Current Meds  Medication Sig  . folic acid (FOLVITE) 1 MG tablet Take 1 tablet (1 mg total) by mouth daily.  . furosemide (LASIX) 40 MG tablet Take one each morning and 1/2 tablet in the late afternoon.  Marland Kitchen ibuprofen (ADVIL,MOTRIN) 200 MG tablet Take 200 mg by mouth every 6 (six) hours as needed (pain).  Marland Kitchen levothyroxine (SYNTHROID) 50 MCG tablet Take 1 tablet (50 mcg total) by mouth daily before breakfast.  . potassium chloride SA (KLOR-CON) 20 MEQ tablet Take 1 tablet (20 mEq total) by mouth daily.     Allergies:   Patient has no known allergies.   Social History   Socioeconomic History  . Marital status: Single    Spouse  name: Not on file  . Number of children: Not on file  . Years of education: Not on file  . Highest education level: Not on file  Occupational History  . Not on file  Tobacco Use  . Smoking status: Never Smoker  . Smokeless tobacco: Never Used  Vaping Use  . Vaping Use: Never used  Substance and Sexual Activity  . Alcohol use: Yes    Comment: 2-4 glasses of wine 2-3 times weekly  . Drug use: No  . Sexual activity: Not on file  Other Topics Concern  . Not on file  Social History  Narrative  . Not on file   Social Determinants of Health   Financial Resource Strain:   . Difficulty of Paying Living Expenses:   Food Insecurity:   . Worried About Charity fundraiser in the Last Year:   . Arboriculturist in the Last Year:   Transportation Needs:   . Film/video editor (Medical):   Marland Kitchen Lack of Transportation (Non-Medical):   Physical Activity:   . Days of Exercise per Week:   . Minutes of Exercise per Session:   Stress:   . Feeling of Stress :   Social Connections:   . Frequency of Communication with Friends and Family:   . Frequency of Social Gatherings with Friends and Family:   . Attends Religious Services:   . Active Member of Clubs or Organizations:   . Attends Archivist Meetings:   Marland Kitchen Marital Status:      Family History: The patient's family history includes Cancer in his mother.  ROS:   Please see the history of present illness.    All other systems reviewed and are negative.  EKGs/Labs/Other Studies Reviewed:    The following studies were reviewed today: EKG was sinus rhythm poor anterior forces and nonspecific ST-T changes.   Recent Labs: 11/06/2019: ALT 27; BUN 15; Creatinine, Ser 0.79; Hemoglobin 12.7; Platelets 165.0; Potassium 4.0; Pro B Natriuretic peptide (BNP) 337.0; Sodium 137; TSH 9.75  Recent Lipid Panel    Component Value Date/Time   CHOL 85 11/01/2018 1156   TRIG 126.0 11/01/2018 1156   HDL 16.20 (L) 11/01/2018 1156   CHOLHDL 5 11/01/2018 1156   VLDL 25.2 11/01/2018 1156   LDLCALC 44 11/01/2018 1156   LDLDIRECT 39.0 11/01/2018 1156    Physical Exam:    VS:  BP (!) 148/84   Pulse 76   Ht 5' 11.5" (1.816 m)   Wt 178 lb 0.6 oz (80.8 kg)   SpO2 99%   BMI 24.49 kg/m     Wt Readings from Last 3 Encounters:  11/24/19 178 lb 0.6 oz (80.8 kg)  11/06/19 193 lb 3.2 oz (87.6 kg)  05/09/19 175 lb (79.4 kg)     GEN: Patient is in no acute distress HEENT: Normal NECK: No JVD; No carotid bruits LYMPHATICS: No  lymphadenopathy CARDIAC: S1 S2 regular, 2/6 systolic murmur at the apex. RESPIRATORY:  Clear to auscultation without rales, wheezing or rhonchi  ABDOMEN: Soft, non-tender, non-distended MUSCULOSKELETAL:  No edema; No deformity  SKIN: Warm and dry NEUROLOGIC:  Alert and oriented x 3 PSYCHIATRIC:  Normal affect    Signed, Jenean Lindau, MD  11/24/2019 11:18 AM    [place]

## 2019-11-27 ENCOUNTER — Other Ambulatory Visit: Payer: Self-pay

## 2019-11-27 ENCOUNTER — Encounter: Payer: Self-pay | Admitting: Family Medicine

## 2019-11-27 ENCOUNTER — Ambulatory Visit (INDEPENDENT_AMBULATORY_CARE_PROVIDER_SITE_OTHER): Payer: BC Managed Care – PPO | Admitting: Family Medicine

## 2019-11-27 VITALS — BP 156/82 | HR 82 | Temp 98.4°F | Ht 71.0 in | Wt 175.8 lb

## 2019-11-27 DIAGNOSIS — I5020 Unspecified systolic (congestive) heart failure: Secondary | ICD-10-CM | POA: Diagnosis not present

## 2019-11-27 DIAGNOSIS — M3213 Lung involvement in systemic lupus erythematosus: Secondary | ICD-10-CM | POA: Diagnosis not present

## 2019-11-27 DIAGNOSIS — E039 Hypothyroidism, unspecified: Secondary | ICD-10-CM | POA: Diagnosis not present

## 2019-11-27 DIAGNOSIS — E559 Vitamin D deficiency, unspecified: Secondary | ICD-10-CM | POA: Diagnosis not present

## 2019-11-27 DIAGNOSIS — E538 Deficiency of other specified B group vitamins: Secondary | ICD-10-CM

## 2019-11-27 DIAGNOSIS — I10 Essential (primary) hypertension: Secondary | ICD-10-CM | POA: Diagnosis not present

## 2019-11-27 MED ORDER — B-12 1000 MCG PO CAPS: ORAL_CAPSULE | ORAL | 1 refills | Status: DC

## 2019-11-27 MED ORDER — LISINOPRIL 20 MG PO TABS: 20.0000 mg | ORAL_TABLET | Freq: Every day | ORAL | 3 refills | Status: DC

## 2019-11-27 NOTE — Progress Notes (Signed)
Established Patient Office Visit  Subjective:  Patient ID: [name], male    DOB: Oct 27, 1970  Age: 49 y.o. MRN: [id number]  CC:  Chief Complaint  Patient presents with  . Follow-up    3 week follow up, no concerns.     HPI [name] presents for follow-up of CHF, hypertension, hypothyroidism, B12 deficiency, lupus.  Decreased DOE and swelling in legs status post treatment with Lasix.  He is taking his potassium with it.  Echocardiogram is planned on the 18th.  Blood pressure remains elevated.  He has started his levothyroxine.  Planning follow-up with his rheumatologist.  He was unable to go secondary to finances.  Taking thiamine as directed.  Past Medical History:  Diagnosis Date  . Lupus ([place])     No past surgical history on file.  Family History  Problem Relation Age of Onset  . Cancer Mother     Social History   Socioeconomic History  . Marital status: Single    Spouse name: Not on file  . Number of children: Not on file  . Years of education: Not on file  . Highest education level: Not on file  Occupational History  . Not on file  Tobacco Use  . Smoking status: Never Smoker  . Smokeless tobacco: Never Used  Vaping Use  . Vaping Use: Never used  Substance and Sexual Activity  . Alcohol use: Yes    Comment: 2-4 glasses of wine 2-3 times weekly  . Drug use: No  . Sexual activity: Not on file  Other Topics Concern  . Not on file  Social History Narrative  . Not on file   Social Determinants of Health   Financial Resource Strain:   . Difficulty of Paying Living Expenses:   Food Insecurity:   . Worried About Charity fundraiser in the Last Year:   . Arboriculturist in the Last Year:   Transportation Needs:   . Film/video editor (Medical):   Marland Kitchen Lack of Transportation (Non-Medical):   Physical Activity:   . Days of Exercise per Week:   . Minutes of Exercise per Session:   Stress:   . Feeling of Stress :   Social Connections:   .  Frequency of Communication with Friends and Family:   . Frequency of Social Gatherings with Friends and Family:   . Attends Religious Services:   . Active Member of Clubs or Organizations:   . Attends Archivist Meetings:   Marland Kitchen Marital Status:   Intimate Partner Violence:   . Fear of Current or Ex-Partner:   . Emotionally Abused:   Marland Kitchen Physically Abused:   . Sexually Abused:     Outpatient Medications Prior to Visit  Medication Sig Dispense Refill  . folic acid (FOLVITE) 1 MG tablet Take 1 tablet (1 mg total) by mouth daily. 90 tablet 0  . furosemide (LASIX) 40 MG tablet Take one each morning and 1/2 tablet in the late afternoon. 60 tablet 1  . ibuprofen (ADVIL,MOTRIN) 200 MG tablet Take 200 mg by mouth every 6 (six) hours as needed (pain).    Marland Kitchen levothyroxine (SYNTHROID) 50 MCG tablet Take 1 tablet (50 mcg total) by mouth daily before breakfast. 90 tablet 0  . potassium chloride SA (KLOR-CON) 20 MEQ tablet Take 1 tablet (20 mEq total) by mouth daily. 30 tablet 3  . Vitamin D, Ergocalciferol, (DRISDOL) 1.25 MG (50000 UNIT) CAPS capsule Take 1 capsule (50,000 Units total) by  mouth every 7 (seven) days. 5 capsule 5  . potassium chloride SA (KLOR-CON) 20 MEQ tablet Take 1 tablet (20 mEq total) by mouth daily. 30 tablet 5   No facility-administered medications prior to visit.    No Known Allergies  ROS Review of Systems  Constitutional: Negative.  Negative for diaphoresis, fatigue and unexpected weight change.  HENT: Negative.   Respiratory: Negative for shortness of breath.   Cardiovascular: Negative.   Gastrointestinal: Negative.   Genitourinary: Negative.   Skin: Negative for color change and pallor.  Neurological: Negative for tremors and speech difficulty.      Objective:    Physical Exam Constitutional:      General: He is not in acute distress.    Appearance: Normal appearance. He is not ill-appearing, toxic-appearing or diaphoretic.  HENT:     Head:  Normocephalic and atraumatic.     Right Ear: External ear normal.     Left Ear: External ear normal.  Eyes:     General:        Right eye: No discharge.        Left eye: No discharge.     Conjunctiva/sclera: Conjunctivae normal.  Cardiovascular:     Rate and Rhythm: Normal rate and regular rhythm.  Pulmonary:     Effort: Pulmonary effort is normal. No respiratory distress.     Breath sounds: Rhonchi (at bases) present.  Musculoskeletal:     Cervical back: No rigidity or tenderness.     Right lower leg: Edema present.     Left lower leg: Edema present.  Lymphadenopathy:     Cervical: No cervical adenopathy.  Skin:    General: Skin is warm and dry.  Neurological:     Mental Status: He is alert and oriented to person, place, and time.  Psychiatric:        Mood and Affect: Mood normal.        Behavior: Behavior normal.     BP (!) 156/82   Pulse 82   Temp 98.4 F (36.9 C) (Tympanic)   Ht 5\' 11"  (1.803 m)   Wt 175 lb 12.8 oz (79.7 kg)   SpO2 97%   BMI 24.52 kg/m  Wt Readings from Last 3 Encounters:  11/27/19 175 lb 12.8 oz (79.7 kg)  11/24/19 178 lb 0.6 oz (80.8 kg)  11/06/19 193 lb 3.2 oz (87.6 kg)     Health Maintenance Due  Topic Date Due  . COVID-19 Vaccine (1) Never done  . COLONOSCOPY  Never done  . INFLUENZA VACCINE  11/26/2019    There are no preventive care reminders to display for this patient.  Lab Results  Component Value Date   TSH 9.75 (H) 11/06/2019   Lab Results  Component Value Date   WBC 2.3 Repeated and verified X2. (L) 11/06/2019   HGB 12.7 (L) 11/06/2019   HCT 38.2 (L) 11/06/2019   MCV 93.2 11/06/2019   PLT 165.0 11/06/2019   Lab Results  Component Value Date   NA 137 11/06/2019   K 4.0 11/06/2019   CO2 25 11/06/2019   GLUCOSE 80 11/06/2019   BUN 15 11/06/2019   CREATININE 0.79 11/06/2019   BILITOT 0.2 11/06/2019   ALKPHOS 104 11/06/2019   AST 56 (H) 11/06/2019   ALT 27 11/06/2019   PROT 5.5 (L) 11/06/2019   ALBUMIN 1.7 (L)  11/06/2019   CALCIUM 7.0 (L) 11/06/2019   ANIONGAP 4 (L) 04/11/2019   GFR 103.76 11/06/2019   Lab Results  Component  Value Date   CHOL 85 11/01/2018   Lab Results  Component Value Date   HDL 16.20 (L) 11/01/2018   Lab Results  Component Value Date   LDLCALC 44 11/01/2018   Lab Results  Component Value Date   TRIG 126.0 11/01/2018   Lab Results  Component Value Date   CHOLHDL 5 11/01/2018   No results found for: HGBA1C    Assessment & Plan:   Problem List Items Addressed This Visit      Cardiovascular and Mediastinum   Congestive heart failure (HCC)   Relevant Medications   lisinopril (ZESTRIL) 20 MG tablet   Other Relevant Orders   Basic metabolic panel     Respiratory   Systemic lupus erythematosus with lung involvement (HCC)   Relevant Medications   lisinopril (ZESTRIL) 20 MG tablet     Endocrine   Acquired hypothyroidism     Other   Vitamin D deficiency   Folate deficiency - Primary   B12 deficiency   Relevant Medications   Cyanocobalamin (B-12) 1000 MCG CAPS    Other Visit Diagnoses    Essential hypertension       Relevant Medications   lisinopril (ZESTRIL) 20 MG tablet   Other Relevant Orders   Basic metabolic panel      Meds ordered this encounter  Medications  . Cyanocobalamin (B-12) 1000 MCG CAPS    Sig: Take one daily    Dispense:  90 capsule    Refill:  1  . lisinopril (ZESTRIL) 20 MG tablet    Sig: Take 1 tablet (20 mg total) by mouth daily.    Dispense:  90 tablet    Refill:  3    Follow-up: Return in about 6 weeks (around 01/08/2020).   Continue Lasix POTASSIUM levothyroxine and vitamin D.  Added B12, lisinopril.  Will schedule follow-up with rheumatology.  Follow-up in 6 weeks to recheck all labs. [name], MD

## 2019-11-28 LAB — BASIC METABOLIC PANEL
BUN: 19 mg/dL (ref 6–23)
CO2: 28 mEq/L (ref 19–32)
Calcium: 7.5 mg/dL — ABNORMAL LOW (ref 8.4–10.5)
Chloride: 103 mEq/L (ref 96–112)
Creatinine, Ser: 0.93 mg/dL (ref 0.40–1.50)
GFR: 85.94 mL/min (ref >60.00)
Glucose, Bld: 80 mg/dL (ref 70–99)
Potassium: 4.3 mEq/L (ref 3.5–5.1)
Sodium: 133 mEq/L — ABNORMAL LOW (ref 135–145)

## 2019-12-11 ENCOUNTER — Telehealth (HOSPITAL_COMMUNITY): Payer: Self-pay

## 2019-12-11 NOTE — Telephone Encounter (Signed)
Spoke with the patient, detailed instructions given. He stated that he understood and would be here for his test. Asked to call back with any questions. S.Harshal Sirmon EMTP 

## 2019-12-14 ENCOUNTER — Ambulatory Visit (HOSPITAL_BASED_OUTPATIENT_CLINIC_OR_DEPARTMENT_OTHER): Payer: BC Managed Care – PPO

## 2019-12-14 ENCOUNTER — Encounter (HOSPITAL_COMMUNITY): Payer: BC Managed Care – PPO

## 2019-12-14 ENCOUNTER — Ambulatory Visit (HOSPITAL_COMMUNITY): Payer: BC Managed Care – PPO | Attending: Cardiology

## 2019-12-14 ENCOUNTER — Other Ambulatory Visit: Payer: Self-pay

## 2019-12-14 DIAGNOSIS — I77819 Aortic ectasia, unspecified site: Secondary | ICD-10-CM

## 2019-12-14 DIAGNOSIS — R0600 Dyspnea, unspecified: Secondary | ICD-10-CM | POA: Insufficient documentation

## 2019-12-14 DIAGNOSIS — M328 Other forms of systemic lupus erythematosus: Secondary | ICD-10-CM | POA: Insufficient documentation

## 2019-12-14 DIAGNOSIS — Z7289 Other problems related to lifestyle: Secondary | ICD-10-CM | POA: Diagnosis not present

## 2019-12-14 DIAGNOSIS — R0609 Other forms of dyspnea: Secondary | ICD-10-CM

## 2019-12-14 DIAGNOSIS — Z789 Other specified health status: Secondary | ICD-10-CM

## 2019-12-14 DIAGNOSIS — I5020 Unspecified systolic (congestive) heart failure: Secondary | ICD-10-CM

## 2019-12-14 LAB — MYOCARDIAL PERFUSION IMAGING
LV dias vol: 170 mL (ref 62–150)
LV sys vol: 90 mL
Peak HR: 101 {beats}/min
Rest HR: 63 {beats}/min
SDS: 0
SRS: 0
SSS: 0
TID: 1.03

## 2019-12-14 LAB — ECHOCARDIOGRAM COMPLETE
Area-P 1/2: 2.62 cm2
Height: 71 in
S' Lateral: 3.5 cm
Weight: 2800 oz

## 2019-12-14 MED ORDER — REGADENOSON 0.4 MG/5ML IV SOLN
0.4000 mg | Freq: Once | INTRAVENOUS | Status: AC
  Administered 2019-12-14: 0.4 mg via INTRAVENOUS

## 2019-12-14 MED ORDER — TECHNETIUM TC 99M TETROFOSMIN IV KIT
31.0000 | PACK | Freq: Once | INTRAVENOUS | Status: AC | PRN
  Administered 2019-12-14: 31 via INTRAVENOUS
  Filled 2019-12-14: qty 31

## 2019-12-14 MED ORDER — TECHNETIUM TC 99M TETROFOSMIN IV KIT
10.6000 | PACK | Freq: Once | INTRAVENOUS | Status: AC | PRN
  Administered 2019-12-14: 10.6 via INTRAVENOUS
  Filled 2019-12-14: qty 11

## 2019-12-20 NOTE — Progress Notes (Unsigned)
ct 

## 2020-01-08 ENCOUNTER — Ambulatory Visit (INDEPENDENT_AMBULATORY_CARE_PROVIDER_SITE_OTHER): Payer: BC Managed Care – PPO | Admitting: Family Medicine

## 2020-01-08 ENCOUNTER — Other Ambulatory Visit: Payer: Self-pay

## 2020-01-08 ENCOUNTER — Encounter: Payer: Self-pay | Admitting: Family Medicine

## 2020-01-08 VITALS — BP 138/80 | HR 84 | Temp 98.1°F | Ht 71.0 in | Wt 178.4 lb

## 2020-01-08 DIAGNOSIS — I5020 Unspecified systolic (congestive) heart failure: Secondary | ICD-10-CM

## 2020-01-08 DIAGNOSIS — E559 Vitamin D deficiency, unspecified: Secondary | ICD-10-CM | POA: Diagnosis not present

## 2020-01-08 DIAGNOSIS — L981 Factitial dermatitis: Secondary | ICD-10-CM | POA: Diagnosis not present

## 2020-01-08 DIAGNOSIS — M3213 Lung involvement in systemic lupus erythematosus: Secondary | ICD-10-CM

## 2020-01-08 DIAGNOSIS — Z7289 Other problems related to lifestyle: Secondary | ICD-10-CM | POA: Diagnosis not present

## 2020-01-08 DIAGNOSIS — E538 Deficiency of other specified B group vitamins: Secondary | ICD-10-CM

## 2020-01-08 DIAGNOSIS — E039 Hypothyroidism, unspecified: Secondary | ICD-10-CM

## 2020-01-08 DIAGNOSIS — R7989 Other specified abnormal findings of blood chemistry: Secondary | ICD-10-CM | POA: Diagnosis not present

## 2020-01-08 DIAGNOSIS — Z789 Other specified health status: Secondary | ICD-10-CM

## 2020-01-08 NOTE — Progress Notes (Addendum)
Established Patient Office Visit  Subjective:  Patient ID: [name], male    DOB: 03-07-1971  Age: 49 y.o. MRN: [id number]  CC:  Chief Complaint  Patient presents with  . Follow-up    6 week follow up on BP and labs, no concerns.     HPI [name] presents for follow-up of hypertension, vitamin D deficiency, hypothyroidism, CHF.  Recent echocardiogram was essentially normal with question of borderline LVH.  Cardiology is ordered further studies.  Patient has an appointment with his rheumatologist next week.  He has started his high-dose vitamin D, B12 and folate.  Continues with lisinopril furosemide and potassium.  He knows to take his levothyroxine on a fasting stomach.  He has not had the Covid vaccine.  Past Medical History:  Diagnosis Date  . Lupus ([place])     History reviewed. No pertinent surgical history.  Family History  Problem Relation Age of Onset  . Cancer Mother     Social History   Socioeconomic History  . Marital status: Single    Spouse name: Not on file  . Number of children: Not on file  . Years of education: Not on file  . Highest education level: Not on file  Occupational History  . Not on file  Tobacco Use  . Smoking status: Never Smoker  . Smokeless tobacco: Never Used  Vaping Use  . Vaping Use: Never used  Substance and Sexual Activity  . Alcohol use: Yes    Comment: 2-4 glasses of wine 2-3 times weekly  . Drug use: No  . Sexual activity: Not on file  Other Topics Concern  . Not on file  Social History Narrative  . Not on file   Social Determinants of Health   Financial Resource Strain:   . Difficulty of Paying Living Expenses: Not on file  Food Insecurity:   . Worried About Charity fundraiser in the Last Year: Not on file  . Ran Out of Food in the Last Year: Not on file  Transportation Needs:   . Lack of Transportation (Medical): Not on file  . Lack of Transportation (Non-Medical): Not on file  Physical Activity:    . Days of Exercise per Week: Not on file  . Minutes of Exercise per Session: Not on file  Stress:   . Feeling of Stress : Not on file  Social Connections:   . Frequency of Communication with Friends and Family: Not on file  . Frequency of Social Gatherings with Friends and Family: Not on file  . Attends Religious Services: Not on file  . Active Member of Clubs or Organizations: Not on file  . Attends Archivist Meetings: Not on file  . Marital Status: Not on file  Intimate Partner Violence:   . Fear of Current or Ex-Partner: Not on file  . Emotionally Abused: Not on file  . Physically Abused: Not on file  . Sexually Abused: Not on file    Outpatient Medications Prior to Visit  Medication Sig Dispense Refill  . Cyanocobalamin (B-12) 1000 MCG CAPS Take one daily 90 capsule 1  . folic acid (FOLVITE) 1 MG tablet Take 1 tablet (1 mg total) by mouth daily. 90 tablet 0  . furosemide (LASIX) 40 MG tablet Take one each morning and 1/2 tablet in the late afternoon. 60 tablet 1  . ibuprofen (ADVIL,MOTRIN) 200 MG tablet Take 200 mg by mouth every 6 (six) hours as needed (pain).    Marland Kitchen  levothyroxine (SYNTHROID) 50 MCG tablet Take 1 tablet (50 mcg total) by mouth daily before breakfast. 90 tablet 0  . lisinopril (ZESTRIL) 20 MG tablet Take 1 tablet (20 mg total) by mouth daily. 90 tablet 3  . potassium chloride SA (KLOR-CON) 20 MEQ tablet Take 1 tablet (20 mEq total) by mouth daily. 30 tablet 3  . Vitamin D, Ergocalciferol, (DRISDOL) 1.25 MG (50000 UNIT) CAPS capsule Take 1 capsule (50,000 Units total) by mouth every 7 (seven) days. 5 capsule 5  . potassium chloride SA (KLOR-CON) 20 MEQ tablet Take 1 tablet (20 mEq total) by mouth daily. 30 tablet 5   No facility-administered medications prior to visit.    No Known Allergies  ROS Review of Systems  Constitutional: Negative.   HENT: Negative.   Eyes: Negative for photophobia and visual disturbance.  Respiratory: Negative for chest  tightness, shortness of breath and wheezing.   Cardiovascular: Negative for palpitations.  Gastrointestinal: Negative.   Genitourinary: Negative.   Musculoskeletal: Negative for gait problem.  Skin: Positive for color change and rash.  Allergic/Immunologic: Negative for immunocompromised state.  Neurological: Negative for tremors and speech difficulty.  Hematological: Does not bruise/bleed easily.      Objective:    Physical Exam Vitals and nursing note reviewed.  Constitutional:      General: He is not in acute distress.    Appearance: Normal appearance. He is normal weight. He is not ill-appearing, toxic-appearing or diaphoretic.  HENT:     Head: Normocephalic and atraumatic.     Right Ear: External ear normal.     Left Ear: External ear normal.  Eyes:     General: No scleral icterus.       Right eye: No discharge.        Left eye: No discharge.     Conjunctiva/sclera: Conjunctivae normal.     Pupils: Pupils are equal, round, and reactive to light.  Pulmonary:     Effort: Pulmonary effort is normal.     Breath sounds: Examination of the right-lower field reveals rhonchi. Examination of the left-lower field reveals rhonchi. Rhonchi present.  Abdominal:     General: Bowel sounds are normal.  Musculoskeletal:     Cervical back: Normal range of motion and neck supple.     Right lower leg: Edema (trace) present.     Left lower leg: Edema (trace) present.  Skin:    General: Skin is warm and dry.  Neurological:     Mental Status: He is alert and oriented to person, place, and time.  Psychiatric:        Mood and Affect: Mood normal.        Behavior: Behavior normal.     BP 138/80   Pulse 84   Temp 98.1 F (36.7 C) (Tympanic)   Ht 5\' 11"  (1.803 m)   Wt 178 lb 6.4 oz (80.9 kg)   SpO2 97%   BMI 24.88 kg/m  Wt Readings from Last 3 Encounters:  01/08/20 178 lb 6.4 oz (80.9 kg)  12/14/19 175 lb (79.4 kg)  11/27/19 175 lb 12.8 oz (79.7 kg)     Health Maintenance Due   Topic Date Due  . COLONOSCOPY  Never done  . INFLUENZA VACCINE  11/26/2019    There are no preventive care reminders to display for this patient.  Lab Results  Component Value Date   TSH 9.75 (H) 11/06/2019   Lab Results  Component Value Date   WBC 2.3 Repeated and verified X2. (L)  11/06/2019   HGB 12.7 (L) 11/06/2019   HCT 38.2 (L) 11/06/2019   MCV 93.2 11/06/2019   PLT 165.0 11/06/2019   Lab Results  Component Value Date   NA 133 (L) 11/27/2019   K 4.3 11/27/2019   CO2 28 11/27/2019   GLUCOSE 80 11/27/2019   BUN 19 11/27/2019   CREATININE 0.93 11/27/2019   BILITOT 0.2 11/06/2019   ALKPHOS 104 11/06/2019   AST 56 (H) 11/06/2019   ALT 27 11/06/2019   PROT 5.5 (L) 11/06/2019   ALBUMIN 1.7 (L) 11/06/2019   CALCIUM 7.5 (L) 11/27/2019   ANIONGAP 4 (L) 04/11/2019   GFR 85.94 11/27/2019   Lab Results  Component Value Date   CHOL 85 11/01/2018   Lab Results  Component Value Date   HDL 16.20 (L) 11/01/2018   Lab Results  Component Value Date   LDLCALC 44 11/01/2018   Lab Results  Component Value Date   TRIG 126.0 11/01/2018   Lab Results  Component Value Date   CHOLHDL 5 11/01/2018   No results found for: HGBA1C    Assessment & Plan:   Problem List Items Addressed This Visit      Cardiovascular and Mediastinum   Congestive heart failure ([place])   Relevant Orders   Basic metabolic panel   CBC     Respiratory   Systemic lupus erythematosus with lung involvement ([place])     Endocrine   Acquired hypothyroidism     Musculoskeletal and Integument   Neurotic excoriations   Relevant Orders   Ambulatory referral to Psychology     Other   Vitamin D deficiency   Relevant Orders   VITAMIN D 25 Hydroxy (Vit-D Deficiency, Fractures)   Alcohol use   Relevant Orders   Hepatic function panel   Elevated LFTs   Relevant Orders   Hepatic function panel   Folate deficiency - Primary   Relevant Orders   B12 and Folate Panel   B12 deficiency   Relevant  Orders   B12 and Folate Panel      No orders of the defined types were placed in this encounter.   Follow-up: Return in about 2 months (around 03/09/2020).   Urged patient to go ahead and have the Covid vaccine.  Counseled him that Covid could be difficult for him with his health history.  We will continue Lasix and potassium for now.  Continue lisinopril.  Be sure to take vitamin D and levothyroxine.  Continue with B12 and folate.  Be sure to follow-up with rheumatology and cardiology as planned.?  Lupus pneumonitis. [name], MD

## 2020-01-09 LAB — HEPATIC FUNCTION PANEL
ALT: 15 U/L (ref 0–53)
AST: 27 U/L (ref 0–37)
Albumin: 2 g/dL — ABNORMAL LOW (ref 3.5–5.2)
Alkaline Phosphatase: 81 U/L (ref 39–117)
Bilirubin, Direct: 0 mg/dL (ref 0.0–0.3)
Total Bilirubin: 0.2 mg/dL (ref 0.2–1.2)
Total Protein: 5.9 g/dL — ABNORMAL LOW (ref 6.0–8.3)

## 2020-01-09 LAB — BASIC METABOLIC PANEL
BUN: 23 mg/dL (ref 6–23)
CO2: 27 mEq/L (ref 19–32)
Calcium: 7.2 mg/dL — ABNORMAL LOW (ref 8.4–10.5)
Chloride: 102 mEq/L (ref 96–112)
Creatinine, Ser: 1.52 mg/dL — ABNORMAL HIGH (ref 0.40–1.50)
GFR: 48.73 mL/min — ABNORMAL LOW (ref >60.00)
Glucose, Bld: 101 mg/dL — ABNORMAL HIGH (ref 70–99)
Potassium: 4.2 mEq/L (ref 3.5–5.1)
Sodium: 133 mEq/L — ABNORMAL LOW (ref 135–145)

## 2020-01-09 LAB — CBC
HCT: 30.2 % — ABNORMAL LOW (ref 39.0–52.0)
Hemoglobin: 10.4 g/dL — ABNORMAL LOW (ref 13.0–17.0)
MCHC: 34.3 g/dL (ref 30.0–36.0)
MCV: 89.5 fl (ref 78.0–100.0)
Platelets: 122 10*3/uL — ABNORMAL LOW (ref 150.0–400.0)
RBC: 3.38 Mil/uL — ABNORMAL LOW (ref 4.22–5.81)
RDW: 13.3 % (ref 11.5–15.5)
WBC: 1.3 10*3/uL — CL (ref 4.0–10.5)

## 2020-01-09 LAB — B12 AND FOLATE PANEL
Folate: 17.6 ng/mL (ref >5.9)
Vitamin B-12: 491 pg/mL (ref 211–911)

## 2020-01-09 LAB — VITAMIN D 25 HYDROXY (VIT D DEFICIENCY, FRACTURES): VITD: <7 ng/mL — ABNORMAL LOW (ref 30.00–100.00)

## 2020-01-09 NOTE — Addendum Note (Signed)
Addended by: Abelino Derrick A on: 01/09/2020 01:07 PM   Modules accepted: Orders

## 2020-01-12 ENCOUNTER — Encounter: Payer: Self-pay | Admitting: Family Medicine

## 2020-01-12 NOTE — Telephone Encounter (Signed)
At this point, I think that the abnormalities seen in your CBC are associated with your Lupus. I will check your iron levels when I see you next time. It is imperative that you see a Rheumatologist as your have planned to do.  On exam I noted that I thought that the neurodermatitis is worse. I have asked a psychologist to see you as well.

## 2020-01-15 ENCOUNTER — Other Ambulatory Visit: Payer: Self-pay | Admitting: Family Medicine

## 2020-01-15 ENCOUNTER — Other Ambulatory Visit: Payer: Self-pay

## 2020-01-15 ENCOUNTER — Encounter: Payer: Self-pay | Admitting: Family Medicine

## 2020-01-15 ENCOUNTER — Telehealth (INDEPENDENT_AMBULATORY_CARE_PROVIDER_SITE_OTHER): Payer: BC Managed Care – PPO | Admitting: Family Medicine

## 2020-01-15 ENCOUNTER — Ambulatory Visit (HOSPITAL_BASED_OUTPATIENT_CLINIC_OR_DEPARTMENT_OTHER)
Admission: RE | Admit: 2020-01-15 | Discharge: 2020-01-15 | Disposition: A | Discharge status: Home / Self Care | Payer: BC Managed Care – PPO | Source: Ambulatory Visit | Attending: Cardiology | Admitting: Cardiology

## 2020-01-15 VITALS — Ht 71.0 in | Wt 178.0 lb

## 2020-01-15 DIAGNOSIS — R7989 Other specified abnormal findings of blood chemistry: Secondary | ICD-10-CM

## 2020-01-15 DIAGNOSIS — E559 Vitamin D deficiency, unspecified: Secondary | ICD-10-CM | POA: Diagnosis not present

## 2020-01-15 DIAGNOSIS — L981 Factitial dermatitis: Secondary | ICD-10-CM

## 2020-01-15 DIAGNOSIS — D61818 Other pancytopenia: Secondary | ICD-10-CM

## 2020-01-15 DIAGNOSIS — I712 Thoracic aortic aneurysm, without rupture: Secondary | ICD-10-CM | POA: Diagnosis not present

## 2020-01-15 DIAGNOSIS — M3213 Lung involvement in systemic lupus erythematosus: Secondary | ICD-10-CM

## 2020-01-15 DIAGNOSIS — E039 Hypothyroidism, unspecified: Secondary | ICD-10-CM

## 2020-01-15 DIAGNOSIS — I77819 Aortic ectasia, unspecified site: Secondary | ICD-10-CM

## 2020-01-15 DIAGNOSIS — N1831 Chronic kidney disease, stage 3a: Secondary | ICD-10-CM | POA: Insufficient documentation

## 2020-01-15 DIAGNOSIS — I700 Atherosclerosis of aorta: Secondary | ICD-10-CM | POA: Diagnosis not present

## 2020-01-15 DIAGNOSIS — E538 Deficiency of other specified B group vitamins: Secondary | ICD-10-CM

## 2020-01-15 NOTE — Progress Notes (Addendum)
Established Patient Office Visit  Subjective:  Patient ID: [name], male    DOB: 1970-05-23  Age: 49 y.o. MRN: [id number]  CC:  Chief Complaint  Patient presents with  . Follow-up    lab results from 01/09/20    HPI [name] presents for discussion of his blood work.  Pancytopenia persists with a significant drop in hemoglobin from 12.7-10.4.  He has seen no blood in his urine or stool.  There is no dark tarry stools.  Renal function has declined from a GFR of 85-48.  Vitamin D remains low.  It is hard to tell whether or not the patient is actually taking his weekly vitamin D medication.  Patient has an appointment with the rheumatologist this coming Thursday.  It is not certain as to whether or not the rash on his face and arms is lupus related.  He has seen 3 different dermatologist he tells me.  Antibiotics and creams have been minimally helpful.  He does admit to some picking.  Biopsy was suggested by one of the dermatologist and he refused it.  Follow-up CT scan of chest and heart is scheduled for today.  Past Medical History:  Diagnosis Date  . Lupus ([place])     History reviewed. No pertinent surgical history.  Family History  Problem Relation Age of Onset  . Cancer Mother     Social History   Socioeconomic History  . Marital status: Single    Spouse name: Not on file  . Number of children: Not on file  . Years of education: Not on file  . Highest education level: Not on file  Occupational History  . Not on file  Tobacco Use  . Smoking status: Never Smoker  . Smokeless tobacco: Never Used  Vaping Use  . Vaping Use: Never used  Substance and Sexual Activity  . Alcohol use: Yes    Comment: 2-4 glasses of wine 2-3 times weekly  . Drug use: No  . Sexual activity: Not on file  Other Topics Concern  . Not on file  Social History Narrative  . Not on file   Social Determinants of Health   Financial Resource Strain:   . Difficulty of Paying Living  Expenses: Not on file  Food Insecurity:   . Worried About Charity fundraiser in the Last Year: Not on file  . Ran Out of Food in the Last Year: Not on file  Transportation Needs:   . Lack of Transportation (Medical): Not on file  . Lack of Transportation (Non-Medical): Not on file  Physical Activity:   . Days of Exercise per Week: Not on file  . Minutes of Exercise per Session: Not on file  Stress:   . Feeling of Stress : Not on file  Social Connections:   . Frequency of Communication with Friends and Family: Not on file  . Frequency of Social Gatherings with Friends and Family: Not on file  . Attends Religious Services: Not on file  . Active Member of Clubs or Organizations: Not on file  . Attends Archivist Meetings: Not on file  . Marital Status: Not on file  Intimate Partner Violence:   . Fear of Current or Ex-Partner: Not on file  . Emotionally Abused: Not on file  . Physically Abused: Not on file  . Sexually Abused: Not on file    Outpatient Medications Prior to Visit  Medication Sig Dispense Refill  . Cyanocobalamin (B-12) 1000 MCG CAPS  Take one daily 90 capsule 1  . folic acid (FOLVITE) 1 MG tablet Take 1 tablet (1 mg total) by mouth daily. 90 tablet 0  . furosemide (LASIX) 40 MG tablet Take one each morning and 1/2 tablet in the late afternoon. 60 tablet 1  . ibuprofen (ADVIL,MOTRIN) 200 MG tablet Take 200 mg by mouth every 6 (six) hours as needed (pain).    Marland Kitchen levothyroxine (SYNTHROID) 50 MCG tablet Take 1 tablet (50 mcg total) by mouth daily before breakfast. 90 tablet 0  . lisinopril (ZESTRIL) 20 MG tablet Take 1 tablet (20 mg total) by mouth daily. 90 tablet 3  . potassium chloride SA (KLOR-CON) 20 MEQ tablet Take 1 tablet (20 mEq total) by mouth daily. 30 tablet 3  . Vitamin D, Ergocalciferol, (DRISDOL) 1.25 MG (50000 UNIT) CAPS capsule Take 1 capsule (50,000 Units total) by mouth every 7 (seven) days. 5 capsule 5   No facility-administered medications  prior to visit.    No Known Allergies  ROS Review of Systems  Constitutional: Negative.   HENT: Negative.   Eyes: Negative for photophobia and visual disturbance.  Respiratory: Negative.   Cardiovascular: Negative.   Gastrointestinal: Negative.  Negative for abdominal pain, anal bleeding and blood in stool.  Endocrine: Negative for polyphagia and polyuria.  Genitourinary: Negative for hematuria.  Musculoskeletal: Positive for arthralgias.  Skin: Positive for rash and wound.  Allergic/Immunologic: Negative for immunocompromised state.  Neurological: Negative for tremors and speech difficulty.  Hematological: Does not bruise/bleed easily.  Psychiatric/Behavioral: Negative.       Objective:    Physical Exam Vitals and nursing note reviewed.  Constitutional:      General: He is not in acute distress.    Appearance: He is ill-appearing. He is not toxic-appearing or diaphoretic.  HENT:     Head: Atraumatic.     Right Ear: External ear normal.     Left Ear: External ear normal.  Eyes:     General:        Right eye: No discharge.        Left eye: No discharge.  Pulmonary:     Effort: Pulmonary effort is normal.  Skin:      Neurological:     Mental Status: He is oriented to person, place, and time.  Psychiatric:        Mood and Affect: Mood normal.        Behavior: Behavior normal.     Ht 5\' 11"  (1.803 m)   Wt 178 lb (80.7 kg) Comment: pt reported  BMI 24.83 kg/m  Wt Readings from Last 3 Encounters:  01/15/20 178 lb (80.7 kg)  01/08/20 178 lb 6.4 oz (80.9 kg)  12/14/19 175 lb (79.4 kg)     Health Maintenance Due  Topic Date Due  . COLONOSCOPY  Never done  . INFLUENZA VACCINE  11/26/2019    There are no preventive care reminders to display for this patient.  Lab Results  Component Value Date   TSH 9.75 (H) 11/06/2019   Lab Results  Component Value Date   WBC 1.3 Repeated and verified X2. (LL) 01/08/2020   HGB 10.4 (L) 01/08/2020   HCT 30.2 (L)  01/08/2020   MCV 89.5 01/08/2020   PLT 122.0 (L) 01/08/2020   Lab Results  Component Value Date   NA 133 (L) 01/08/2020   K 4.2 01/08/2020   CO2 27 01/08/2020   GLUCOSE 101 (H) 01/08/2020   BUN 23 01/08/2020   CREATININE 1.52 (H) 01/08/2020  BILITOT 0.2 01/08/2020   ALKPHOS 81 01/08/2020   AST 27 01/08/2020   ALT 15 01/08/2020   PROT 5.9 (L) 01/08/2020   ALBUMIN 2.0 (L) 01/08/2020   CALCIUM 7.2 (L) 01/08/2020   ANIONGAP 4 (L) 04/11/2019   GFR 48.73 (L) 01/08/2020   Lab Results  Component Value Date   CHOL 85 11/01/2018   Lab Results  Component Value Date   HDL 16.20 (L) 11/01/2018   Lab Results  Component Value Date   LDLCALC 44 11/01/2018   Lab Results  Component Value Date   TRIG 126.0 11/01/2018   Lab Results  Component Value Date   CHOLHDL 5 11/01/2018   No results found for: HGBA1C    Assessment & Plan:   Problem List Items Addressed This Visit      Respiratory   Systemic lupus erythematosus with lung involvement ([place])     Endocrine   Acquired hypothyroidism   Relevant Orders   TSH     Musculoskeletal and Integument   Neurotic excoriations     Genitourinary   Stage 3a chronic kidney disease     Hematopoietic and Hemostatic   Pancytopenia ([place])     Other   Vitamin D deficiency - Primary      No orders of the defined types were placed in this encounter.   Follow-up: Return in about 6 weeks (around 02/26/2020).  Will take his vitamin D weekly, he assures me.  Will see rheumatology.  Am concerned that lupus is probably affecting multiple organ systems.  I tried to stress test to him.  Treatment of his lupus is critical.  Asked for psychology consultation to help with the neuroses associated with his skin rash.  Will consider adding an ACE inhibitor at our next visit.  Virtual Visit via Video Note  I connected with Gunnar Hereford Daughenbaugh on 01/16/20 at  3:00 PM EDT by a video enabled telemedicine application and verified that I am speaking with  the correct person using two identifiers.  Location: Patient: home with sister.  Provider:    I discussed the limitations of evaluation and management by telemedicine and the availability of in person appointments. The patient expressed understanding and agreed to proceed.  History of Present Illness:    Observations/Objective:   Assessment and Plan:   Follow Up Instructions:    I discussed the assessment and treatment plan with the patient. The patient was provided an opportunity to ask questions and all were answered. The patient agreed with the plan and demonstrated an understanding of the instructions.   The patient was advised to call back or seek an in-person evaluation if the symptoms worsen or if the condition fails to improve as anticipated.  I provided 20 minutes of non-face-to-face time during this encounter.   [name], MD   [name], MD

## 2020-01-16 NOTE — Addendum Note (Signed)
Addended by: Jon Billings on: 01/16/2020 07:52 AM   Modules accepted: Orders

## 2020-01-17 NOTE — Telephone Encounter (Signed)
Patient aware of message below.

## 2020-01-26 ENCOUNTER — Ambulatory Visit: Payer: BC Managed Care – PPO | Admitting: Cardiology

## 2020-03-11 ENCOUNTER — Ambulatory Visit: Payer: BC Managed Care – PPO | Admitting: Family Medicine

## 2020-04-08 ENCOUNTER — Other Ambulatory Visit: Payer: Self-pay | Admitting: Family Medicine

## 2020-04-08 DIAGNOSIS — I509 Heart failure, unspecified: Secondary | ICD-10-CM

## 2020-04-17 ENCOUNTER — Encounter: Payer: Self-pay | Admitting: Family Medicine

## 2020-04-18 ENCOUNTER — Other Ambulatory Visit: Payer: Self-pay | Admitting: Nurse Practitioner

## 2020-04-18 ENCOUNTER — Ambulatory Visit (HOSPITAL_COMMUNITY): Payer: BC Managed Care – PPO

## 2020-04-18 DIAGNOSIS — I5020 Unspecified systolic (congestive) heart failure: Secondary | ICD-10-CM

## 2020-04-18 DIAGNOSIS — M3213 Lung involvement in systemic lupus erythematosus: Secondary | ICD-10-CM

## 2020-04-18 DIAGNOSIS — U071 COVID-19: Secondary | ICD-10-CM

## 2020-04-18 NOTE — Progress Notes (Signed)
I connected by phone with [name] on 04/18/2020 at 1:59 PM to discuss the potential use of a new treatment for mild to moderate COVID-19 viral infection in non-hospitalized patients.  This patient is a 49 y.o. male that meets the FDA criteria for Emergency Use Authorization of COVID monoclonal antibody casirivimab/imdevimab, bamlanivimab/etesevimab, or sotrovimab.  Has a (+) direct SARS-CoV-2 viral test result  Has mild or moderate COVID-19   Is NOT hospitalized due to COVID-19  Is within 10 days of symptom onset  Has at least one of the high risk factor(s) for progression to severe COVID-19 and/or hospitalization as defined in EUA.  Specific high risk criteria : BMI > 25, Immunosuppressive Disease or Treatment, Cardiovascular disease or hypertension and Other high risk medical condition per CDC:  Lupus    I have spoken and communicated the following to the patient or parent/caregiver regarding COVID monoclonal antibody treatment:  1. FDA has authorized the emergency use for the treatment of mild to moderate COVID-19 in adults and pediatric patients with positive results of direct SARS-CoV-2 viral testing who are 71 years of age and older weighing at least 40 kg, and who are at high risk for progressing to severe COVID-19 and/or hospitalization.  2. The significant known and potential risks and benefits of COVID monoclonal antibody, and the extent to which such potential risks and benefits are unknown.  3. Information on available alternative treatments and the risks and benefits of those alternatives, including clinical trials.  4. Patients treated with COVID monoclonal antibody should continue to self-isolate and use infection control measures (e.g., wear mask, isolate, social distance, avoid sharing personal items, clean and disinfect "high touch" surfaces, and frequent handwashing) according to CDC guidelines.   5. The patient or parent/caregiver has the option to accept or  refuse COVID monoclonal antibody treatment.  After reviewing this information with the patient, the patient has agreed to receive one of the available covid 19 monoclonal antibodies and will be provided an appropriate fact sheet prior to infusion. [name], NP 04/18/2020 1:59 PM

## 2020-04-18 NOTE — Telephone Encounter (Signed)
Called pt and explained possible monoclonal antibody treatment.. Pt is not vaccinated. Lives in [place]. Sx started 12/19 or 12/20. His son tested positive on 12/20. Pt said he has similar sx as his son, but he has not tested. Michela Pitcher he will test today. He reports SOB. Said he has shortness of breath at baseline due to lupus, but this is different. He reports his fingertips are pink, he can still breathe, but coughs when he breathes in. Additional sx include chest congestion and cough. Qualifying risk factors include congestive heart failure, pleural effusion, systemic lupus erythematosus with lung involvement, HTN, chronic kidney disease, and pericarditis. Pt interested in tx. Informed pt an APP will call back to possibly schedule an appointment. Gave pt insurance CPT code [phone] to call insurance company regarding coverage. Asked him to text test results to [phone] or email [email or web address]

## 2020-04-22 ENCOUNTER — Encounter: Payer: Self-pay | Admitting: Family Medicine

## 2020-04-22 ENCOUNTER — Ambulatory Visit (HOSPITAL_COMMUNITY)
Admission: RE | Admit: 2020-04-22 | Discharge: 2020-04-22 | Disposition: A | Discharge status: Home / Self Care | Payer: BC Managed Care – PPO | Source: Ambulatory Visit | Attending: Pulmonary Disease | Admitting: Pulmonary Disease

## 2020-04-22 DIAGNOSIS — U071 COVID-19: Secondary | ICD-10-CM | POA: Diagnosis not present

## 2020-04-22 DIAGNOSIS — I5020 Unspecified systolic (congestive) heart failure: Secondary | ICD-10-CM | POA: Diagnosis not present

## 2020-04-22 DIAGNOSIS — M3213 Lung involvement in systemic lupus erythematosus: Secondary | ICD-10-CM

## 2020-04-22 MED ORDER — SODIUM CHLORIDE 0.9 % IV SOLN: INTRAVENOUS | Status: DC | PRN

## 2020-04-22 MED ORDER — METHYLPREDNISOLONE SODIUM SUCC 125 MG IJ SOLR: 125.0000 mg | Freq: Once | INTRAMUSCULAR | Status: DC | PRN

## 2020-04-22 MED ORDER — FAMOTIDINE IN NACL 20-0.9 MG/50ML-% IV SOLN: 20.0000 mg | Freq: Once | INTRAVENOUS | Status: DC | PRN

## 2020-04-22 MED ORDER — SODIUM CHLORIDE 0.9 % IV SOLN: Freq: Once | INTRAVENOUS | Status: AC

## 2020-04-22 MED ORDER — EPINEPHRINE 0.3 MG/0.3ML IJ SOAJ: 0.3000 mg | Freq: Once | INTRAMUSCULAR | Status: DC | PRN

## 2020-04-22 MED ORDER — DIPHENHYDRAMINE HCL 50 MG/ML IJ SOLN: 50.0000 mg | Freq: Once | INTRAMUSCULAR | Status: DC | PRN

## 2020-04-22 MED ORDER — ALBUTEROL SULFATE HFA 108 (90 BASE) MCG/ACT IN AERS: 2.0000 | INHALATION_SPRAY | Freq: Once | RESPIRATORY_TRACT | Status: DC | PRN

## 2020-04-22 NOTE — Discharge Instructions (Signed)
10 Things You Can Do to Manage Your COVID-19 Symptoms at Home If you have possible or confirmed COVID-19: 1. Stay home from work and school. And stay away from other public places. If you must go out, avoid using any kind of public transportation, ridesharing, or taxis. 2. Monitor your symptoms carefully. If your symptoms get worse, call your healthcare provider immediately. 3. Get rest and stay hydrated. 4. If you have a medical appointment, call the healthcare provider ahead of time and tell them that you have or may have COVID-19. 5. For medical emergencies, call 911 and notify the dispatch personnel that you have or may have COVID-19. 6. Cover your cough and sneezes with a tissue or use the inside of your elbow. 7. Wash your hands often with soap and water for at least 20 seconds or clean your hands with an alcohol-based hand sanitizer that contains at least 60% alcohol. 8. As much as possible, stay in a specific room and away from other people in your home. Also, you should use a separate bathroom, if available. If you need to be around other people in or outside of the home, wear a mask. 9. Avoid sharing personal items with other people in your household, like dishes, towels, and bedding. 10. Clean all surfaces that are touched often, like counters, tabletops, and doorknobs. Use household cleaning sprays or wipes according to the label instructions. cdc.gov/coronavirus 10/26/2018 This information is not intended to replace advice given to you by your health care provider. Make sure you discuss any questions you have with your health care provider. Document Revised: 03/30/2019 Document Reviewed: 03/30/2019 Elsevier Patient Education  2020 Elsevier Inc. What types of side effects do monoclonal antibody drugs cause?  Common side effects  In general, the more common side effects caused by monoclonal antibody drugs include: . Allergic reactions, such as hives or itching . Flu-like signs and  symptoms, including chills, fatigue, fever, and muscle aches and pains . Nausea, vomiting . Diarrhea . Skin rashes . Low blood pressure   The CDC is recommending patients who receive monoclonal antibody treatments wait at least 90 days before being vaccinated.  Currently, there are no data on the safety and efficacy of mRNA COVID-19 vaccines in persons who received monoclonal antibodies or convalescent plasma as part of COVID-19 treatment. Based on the estimated half-life of such therapies as well as evidence suggesting that reinfection is uncommon in the 90 days after initial infection, vaccination should be deferred for at least 90 days, as a precautionary measure until additional information becomes available, to avoid interference of the antibody treatment with vaccine-induced immune responses. If you have any questions or concerns after the infusion please call the Advanced Practice Provider on call at [phone]. This number is ONLY intended for your use regarding questions or concerns about the infusion post-treatment side-effects.  Please do not provide this number to others for use. For return to work notes please contact your primary care provider.   If someone you know is interested in receiving treatment please have them call the COVID hotline at [phone].   

## 2020-04-22 NOTE — Progress Notes (Signed)
Patient reviewed Fact Sheet for Patients, Parents, and Caregivers for Emergency Use Authorization (EUA) of Casirivimab and Imdevimab for the Treatment of Coronavirus. Patient also reviewed and is agreeable to the estimated cost of treatment. Patient is agreeable to proceed.   

## 2020-04-22 NOTE — Progress Notes (Signed)
  Diagnosis: COVID-19  Physician:  Asencion Noble  Procedure: [facility] Med: casirivimab\imdevimab infusion - Provided patient with casirivimab\imdevimab fact sheet for patients, parents and caregivers prior to infusion.  Complications: No immediate complications noted.  Discharge: Discharged home   Dorene Sorrow 04/22/2020

## 2020-04-27 ENCOUNTER — Emergency Department (HOSPITAL_COMMUNITY): Payer: BC Managed Care – PPO

## 2020-04-27 ENCOUNTER — Other Ambulatory Visit: Payer: Self-pay

## 2020-04-27 ENCOUNTER — Inpatient Hospital Stay (HOSPITAL_COMMUNITY)
Admission: EM | Admit: 2020-04-27 | Discharge: 2020-05-11 | DRG: 291 | Disposition: A | Discharge status: Home / Self Care | Payer: BC Managed Care – PPO | Attending: Internal Medicine | Admitting: Internal Medicine

## 2020-04-27 DIAGNOSIS — I509 Heart failure, unspecified: Secondary | ICD-10-CM | POA: Diagnosis not present

## 2020-04-27 DIAGNOSIS — I313 Pericardial effusion (noninflammatory): Secondary | ICD-10-CM | POA: Diagnosis present

## 2020-04-27 DIAGNOSIS — E039 Hypothyroidism, unspecified: Secondary | ICD-10-CM | POA: Diagnosis present

## 2020-04-27 DIAGNOSIS — Z8739 Personal history of other diseases of the musculoskeletal system and connective tissue: Secondary | ICD-10-CM

## 2020-04-27 DIAGNOSIS — M3213 Lung involvement in systemic lupus erythematosus: Secondary | ICD-10-CM | POA: Diagnosis present

## 2020-04-27 DIAGNOSIS — E875 Hyperkalemia: Secondary | ICD-10-CM | POA: Diagnosis present

## 2020-04-27 DIAGNOSIS — I130 Hypertensive heart and chronic kidney disease with heart failure and stage 1 through stage 4 chronic kidney disease, or unspecified chronic kidney disease: Secondary | ICD-10-CM | POA: Diagnosis not present

## 2020-04-27 DIAGNOSIS — R0602 Shortness of breath: Secondary | ICD-10-CM | POA: Diagnosis not present

## 2020-04-27 DIAGNOSIS — N179 Acute kidney failure, unspecified: Secondary | ICD-10-CM | POA: Diagnosis not present

## 2020-04-27 DIAGNOSIS — Z79890 Hormone replacement therapy (postmenopausal): Secondary | ICD-10-CM

## 2020-04-27 DIAGNOSIS — R7989 Other specified abnormal findings of blood chemistry: Secondary | ICD-10-CM | POA: Diagnosis present

## 2020-04-27 DIAGNOSIS — M329 Systemic lupus erythematosus, unspecified: Secondary | ICD-10-CM | POA: Diagnosis present

## 2020-04-27 DIAGNOSIS — R809 Proteinuria, unspecified: Secondary | ICD-10-CM | POA: Diagnosis present

## 2020-04-27 DIAGNOSIS — E8770 Fluid overload, unspecified: Secondary | ICD-10-CM | POA: Diagnosis not present

## 2020-04-27 DIAGNOSIS — D61818 Other pancytopenia: Secondary | ICD-10-CM | POA: Diagnosis present

## 2020-04-27 DIAGNOSIS — R000 Tachycardia, unspecified: Secondary | ICD-10-CM | POA: Diagnosis not present

## 2020-04-27 DIAGNOSIS — J9811 Atelectasis: Secondary | ICD-10-CM | POA: Diagnosis not present

## 2020-04-27 DIAGNOSIS — Z20822 Contact with and (suspected) exposure to covid-19: Secondary | ICD-10-CM | POA: Diagnosis present

## 2020-04-27 DIAGNOSIS — K661 Hemoperitoneum: Secondary | ICD-10-CM | POA: Diagnosis not present

## 2020-04-27 DIAGNOSIS — Z6825 Body mass index (BMI) 25.0-25.9, adult: Secondary | ICD-10-CM

## 2020-04-27 DIAGNOSIS — R609 Edema, unspecified: Secondary | ICD-10-CM | POA: Diagnosis not present

## 2020-04-27 DIAGNOSIS — N189 Chronic kidney disease, unspecified: Secondary | ICD-10-CM | POA: Diagnosis not present

## 2020-04-27 DIAGNOSIS — I5031 Acute diastolic (congestive) heart failure: Secondary | ICD-10-CM | POA: Diagnosis present

## 2020-04-27 DIAGNOSIS — E872 Acidosis: Secondary | ICD-10-CM | POA: Diagnosis present

## 2020-04-27 DIAGNOSIS — R3129 Other microscopic hematuria: Secondary | ICD-10-CM | POA: Diagnosis present

## 2020-04-27 DIAGNOSIS — N269 Renal sclerosis, unspecified: Secondary | ICD-10-CM | POA: Diagnosis not present

## 2020-04-27 DIAGNOSIS — D62 Acute posthemorrhagic anemia: Secondary | ICD-10-CM | POA: Diagnosis not present

## 2020-04-27 DIAGNOSIS — K567 Ileus, unspecified: Secondary | ICD-10-CM | POA: Diagnosis not present

## 2020-04-27 DIAGNOSIS — J90 Pleural effusion, not elsewhere classified: Secondary | ICD-10-CM | POA: Diagnosis not present

## 2020-04-27 DIAGNOSIS — R188 Other ascites: Secondary | ICD-10-CM | POA: Diagnosis not present

## 2020-04-27 DIAGNOSIS — Z9119 Patient's noncompliance with other medical treatment and regimen: Secondary | ICD-10-CM

## 2020-04-27 DIAGNOSIS — I361 Nonrheumatic tricuspid (valve) insufficiency: Secondary | ICD-10-CM | POA: Diagnosis not present

## 2020-04-27 DIAGNOSIS — E871 Hypo-osmolality and hyponatremia: Secondary | ICD-10-CM | POA: Diagnosis not present

## 2020-04-27 DIAGNOSIS — I129 Hypertensive chronic kidney disease with stage 1 through stage 4 chronic kidney disease, or unspecified chronic kidney disease: Secondary | ICD-10-CM | POA: Diagnosis not present

## 2020-04-27 DIAGNOSIS — R1900 Intra-abdominal and pelvic swelling, mass and lump, unspecified site: Secondary | ICD-10-CM | POA: Diagnosis not present

## 2020-04-27 DIAGNOSIS — Z79899 Other long term (current) drug therapy: Secondary | ICD-10-CM

## 2020-04-27 DIAGNOSIS — M328 Other forms of systemic lupus erythematosus: Secondary | ICD-10-CM | POA: Diagnosis not present

## 2020-04-27 DIAGNOSIS — M7989 Other specified soft tissue disorders: Secondary | ICD-10-CM | POA: Diagnosis not present

## 2020-04-27 DIAGNOSIS — E440 Moderate protein-calorie malnutrition: Secondary | ICD-10-CM | POA: Insufficient documentation

## 2020-04-27 DIAGNOSIS — R109 Unspecified abdominal pain: Secondary | ICD-10-CM

## 2020-04-27 DIAGNOSIS — R0600 Dyspnea, unspecified: Secondary | ICD-10-CM | POA: Diagnosis not present

## 2020-04-27 DIAGNOSIS — N1831 Chronic kidney disease, stage 3a: Secondary | ICD-10-CM | POA: Diagnosis not present

## 2020-04-27 DIAGNOSIS — R2240 Localized swelling, mass and lump, unspecified lower limb: Secondary | ICD-10-CM | POA: Diagnosis not present

## 2020-04-27 DIAGNOSIS — E538 Deficiency of other specified B group vitamins: Secondary | ICD-10-CM | POA: Diagnosis present

## 2020-04-27 DIAGNOSIS — K5730 Diverticulosis of large intestine without perforation or abscess without bleeding: Secondary | ICD-10-CM | POA: Diagnosis present

## 2020-04-27 DIAGNOSIS — D649 Anemia, unspecified: Secondary | ICD-10-CM | POA: Diagnosis not present

## 2020-04-27 DIAGNOSIS — Z4901 Encounter for fitting and adjustment of extracorporeal dialysis catheter: Secondary | ICD-10-CM | POA: Diagnosis not present

## 2020-04-27 LAB — CBC WITH DIFFERENTIAL/PLATELET
Abs Immature Granulocytes: 0.02 10*3/uL (ref 0.00–0.07)
Basophils Absolute: 0 10*3/uL (ref 0.0–0.1)
Basophils Relative: 0 %
Eosinophils Absolute: 0.2 10*3/uL (ref 0.0–0.5)
Eosinophils Relative: 5 %
HCT: 25.9 % — ABNORMAL LOW (ref 39.0–52.0)
Hemoglobin: 8.6 g/dL — ABNORMAL LOW (ref 13.0–17.0)
Immature Granulocytes: 1 %
Lymphocytes Relative: 16 %
Lymphs Abs: 0.4 10*3/uL — ABNORMAL LOW (ref 0.7–4.0)
MCH: 30.2 pg (ref 26.0–34.0)
MCHC: 33.2 g/dL (ref 30.0–36.0)
MCV: 90.9 fL (ref 80.0–100.0)
Monocytes Absolute: 0.2 10*3/uL (ref 0.1–1.0)
Monocytes Relative: 9 %
Neutro Abs: 1.9 10*3/uL (ref 1.7–7.7)
Neutrophils Relative %: 69 %
Platelets: 188 10*3/uL (ref 150–400)
RBC: 2.85 MIL/uL — ABNORMAL LOW (ref 4.22–5.81)
RDW: 12.6 % (ref 11.5–15.5)
WBC: 2.8 10*3/uL — ABNORMAL LOW (ref 4.0–10.5)
nRBC: 0 % (ref 0.0–0.2)

## 2020-04-27 LAB — RESP PANEL BY RT-PCR (FLU A&B, COVID) ARPGX2
Influenza A by PCR: NEGATIVE
Influenza B by PCR: NEGATIVE
SARS Coronavirus 2 by RT PCR: NEGATIVE

## 2020-04-27 LAB — COMPREHENSIVE METABOLIC PANEL
ALT: 19 U/L (ref 0–44)
AST: 21 U/L (ref 15–41)
Albumin: 1.6 g/dL — ABNORMAL LOW (ref 3.5–5.0)
Alkaline Phosphatase: 66 U/L (ref 38–126)
Anion gap: 9 (ref 5–15)
BUN: 82 mg/dL — ABNORMAL HIGH (ref 6–20)
CO2: 11 mmol/L — ABNORMAL LOW (ref 22–32)
Calcium: 7.3 mg/dL — ABNORMAL LOW (ref 8.9–10.3)
Chloride: 113 mmol/L — ABNORMAL HIGH (ref 98–111)
Creatinine, Ser: 4.19 mg/dL — ABNORMAL HIGH (ref 0.61–1.24)
GFR, Estimated: 16 mL/min — ABNORMAL LOW (ref >60)
Glucose, Bld: 97 mg/dL (ref 70–99)
Potassium: 5.3 mmol/L — ABNORMAL HIGH (ref 3.5–5.1)
Sodium: 133 mmol/L — ABNORMAL LOW (ref 135–145)
Total Bilirubin: 0.3 mg/dL (ref 0.3–1.2)
Total Protein: 5.6 g/dL — ABNORMAL LOW (ref 6.5–8.1)

## 2020-04-27 LAB — BRAIN NATRIURETIC PEPTIDE: B Natriuretic Peptide: 36.5 pg/mL (ref 0.0–100.0)

## 2020-04-27 MED ORDER — FUROSEMIDE 10 MG/ML IJ SOLN
40.0000 mg | Freq: Once | INTRAMUSCULAR | Status: AC
  Administered 2020-04-27: 40 mg via INTRAVENOUS
  Filled 2020-04-27: qty 4

## 2020-04-27 NOTE — H&P (Signed)
[facility] History and Physical   Patient: [name] MVH:[id number]   PCP: [name], MD DOB: 22-Jan-1971   DOA: 04/27/2020   DOS: 04/27/2020   DOS: the patient was seen and examined on 04/27/2020  Patient coming from: The patient is coming from Home  Chief Complaint: Cough and shortness of breath with edema and stomach swelling  HPI: [name] is a 50 y.o. male with Past medical history of lupus, chronic pancytopenia, B12 deficiency, hypothyroidism. Patient presented with complaints of shortness of breath as well as abdominal distention and swelling of the leg. He tells me that his symptoms have been progressively getting worse over last 3 weeks. Patient reportedly has history of lupus but does not take any medication. Patient tells me that he has seen his rheumatologist 3 to 4 months ago but has still stopped taking his Imuran on his own and currently does not take any prednisone. Patient tells me that before 3 weeks he was at baseline without any complaints. He reports progressively worsening swelling of his legs as well as abdomen.  Denies any alcohol abuse denies any drug abuse. Reports dizziness and lightheadedness.  Also reports dry cough.  Reports chronic intermittent chest pain pleuritic in nature. Reports negative output for 48 hours. No diarrhea no constipation reported. No blood in the stool. Tells me that he is compliant with his Lasix, potassium as well as lisinopril although stopped taking it a week ago because he was not making a lot of urine. Never seen nephrologist.  ED Course: Presents with above complaint.  Found to have severe abdominal distention as well as leg swelling.  CT scan of the chest and abdomen shows evidence of ascites as well as anasarca.  Work-up also shows hypoalbuminemia.  Patient was referred for further treatment of work-up.  Review of Systems: as mentioned in the history of present illness.  All other systems reviewed and  are negative.  Past Medical History:  Diagnosis Date   Lupus ([place])    No past surgical history on file. Social History:  reports that he has never smoked. He has never used smokeless tobacco. He reports current alcohol use. He reports that he does not use drugs.  No Known Allergies  Family history reviewed and not pertinent Family History  Problem Relation Age of Onset   Cancer Mother      Prior to Admission medications   Medication Sig Start Date End Date Taking? Authorizing Provider  folic acid (FOLVITE) 1 MG tablet Take 1 tablet by mouth once daily 01/16/20  Yes [name], MD  furosemide (LASIX) 40 MG tablet TAKE 1 TABLET BY MOUTH IN THE MORNING AND  1/2  (ONE-HALF)  TABLET  BY  MOUTH  IN  THE  LATE  AFTERNOON 04/09/20  Yes [name], MD  ibuprofen (ADVIL,MOTRIN) 200 MG tablet Take 200 mg by mouth every 6 (six) hours as needed (pain).   Yes [provider]  levothyroxine (SYNTHROID) 50 MCG tablet TAKE 1 TABLET BY MOUTH ONCE DAILY BEFORE BREAKFAST 01/16/20  Yes [name], MD  lisinopril (ZESTRIL) 20 MG tablet Take 1 tablet (20 mg total) by mouth daily. 11/27/19  Yes [name], MD  potassium chloride SA (KLOR-CON) 20 MEQ tablet Take 1 tablet (20 mEq total) by mouth daily. 11/06/19  Yes [name], MD  vitamin B-12 (CYANOCOBALAMIN) 1000 MCG tablet Take 1,000 mcg by mouth daily. 04/09/20  Yes [provider]  Vitamin D, Ergocalciferol, (DRISDOL)  1.25 MG (50000 UNIT) CAPS capsule Take 1 capsule (50,000 Units total) by mouth every 7 (seven) days. 11/12/19  Yes Mliss Sax, MD  Cyanocobalamin (B-12) 1000 MCG CAPS Take one daily Patient not taking: Reported on 04/27/2020 11/27/19   Mliss Sax, MD    Physical Exam: Vitals:   04/27/20 2045 04/27/20 2100 04/27/20 2130 04/27/20 2256  BP:   (!) 149/100 120/87  Pulse: (!) 101 97 (!) 106 87  Resp: 19 19 (!) 24 15  Temp:      TempSrc:       SpO2: 100% 100% 98% 100%  Weight:      Height:        General: alert and oriented to time, place, and person. Appear in moderate distress, affect appropriate Eyes: PERRL, Conjunctiva normal ENT: Oral Mucosa Clear, moist  Neck: positive JVD, no Abnormal Mass Or lumps Cardiovascular: S1 and S2 Present, no Murmur, peripheral pulses symmetrical Respiratory: increased respiratory effort, Bilateral Air entry equal and Decreased, no signs of accessory muscle use, bilateral faint Crackles, no wheezes Abdomen: Bowel Sound present, Soft and diffuse, mild tenderness, no hernia Skin: no rashes  Extremities: bilateral  Pedal edema, no calf tenderness Neurologic: without any new focal findings Gait not checked due to patient safety concerns  Data Reviewed: I have personally reviewed and interpreted labs, imaging as discussed below.  CBC: Recent Labs  Lab 04/27/20 2016  WBC 2.8*  NEUTROABS 1.9  HGB 8.6*  HCT 25.9*  MCV 90.9  PLT 188   Basic Metabolic Panel: Recent Labs  Lab 04/27/20 2016  NA 133*  K 5.3*  CL 113*  CO2 11*  GLUCOSE 97  BUN 82*  CREATININE 4.19*  CALCIUM 7.3*   GFR: Estimated Creatinine Clearance: 22.5 mL/min (A) (by C-G formula based on SCr of 4.19 mg/dL (H)). Liver Function Tests: Recent Labs  Lab 04/27/20 2016  AST 21  ALT 19  ALKPHOS 66  BILITOT 0.3  PROT 5.6*  ALBUMIN 1.6*   No results for input(s): LIPASE, AMYLASE in the last 168 hours. No results for input(s): AMMONIA in the last 168 hours. Coagulation Profile: No results for input(s): INR, PROTIME in the last 168 hours. Cardiac Enzymes: No results for input(s): CKTOTAL, CKMB, CKMBINDEX, TROPONINI in the last 168 hours. BNP (last 3 results) Recent Labs    11/06/19 1613  PROBNP 337.0*   HbA1C: No results for input(s): HGBA1C in the last 72 hours. CBG: No results for input(s): GLUCAP in the last 168 hours. Lipid Profile: No results for input(s): CHOL, HDL, LDLCALC, TRIG, CHOLHDL,  LDLDIRECT in the last 72 hours. Thyroid Function Tests: No results for input(s): TSH, T4TOTAL, FREET4, T3FREE, THYROIDAB in the last 72 hours. Anemia Panel: No results for input(s): VITAMINB12, FOLATE, FERRITIN, TIBC, IRON, RETICCTPCT in the last 72 hours. Urine analysis:    Component Value Date/Time   COLORURINE DARK YELLOW 11/01/2018 1325   APPEARANCEUR TURBID (A) 11/01/2018 1325   LABSPEC 1.019 11/01/2018 1325   PHURINE 5.5 11/01/2018 1325   GLUCOSEU NEGATIVE 11/01/2018 1325   HGBUR NEGATIVE 11/01/2018 1325   KETONESUR NEGATIVE 11/01/2018 1325   PROTEINUR 1+ (A) 11/01/2018 1325   NITRITE NEGATIVE 11/01/2018 1325   LEUKOCYTESUR NEGATIVE 11/01/2018 1325    Radiological Exams on Admission: CT ABDOMEN PELVIS WO CONTRAST  Result Date: 04/27/2020 CLINICAL DATA:  Congestive heart failure, tachycardia, dyspnea, abdominal distension, lower extremity edema EXAM: CT CHEST, ABDOMEN AND PELVIS WITHOUT CONTRAST TECHNIQUE: Multidetector CT imaging of the chest, abdomen and pelvis  was performed following the standard protocol without IV contrast. COMPARISON:  None. FINDINGS: CT CHEST FINDINGS Cardiovascular: Mild coronary artery calcification. Global cardiac size within normal limits. Small pericardial effusion. No CT evidence of cardiac tamponade. Central pulmonary arteries are of normal caliber. Thoracic aorta is unremarkable. Mediastinum/Nodes: Visualized thyroid unremarkable. No pathologic thoracic adenopathy. Esophagus unremarkable. Lungs/Pleura: Trace right and small left pleural effusions are present with mild bibasilar atelectasis. Lungs are otherwise clear. No pneumothorax. Central airways are widely patent. Musculoskeletal: No acute bone abnormality. No lytic or blastic bone lesion. CT ABDOMEN PELVIS FINDINGS Hepatobiliary: No focal liver abnormality is seen. No gallstones, gallbladder wall thickening, or biliary dilatation. Pancreas: Unremarkable Spleen: Unremarkable Adrenals/Urinary Tract:  Adrenal glands are unremarkable. Kidneys are normal, without renal calculi, focal lesion, or hydronephrosis. Bladder is unremarkable. Stomach/Bowel: Moderate descending and sigmoid colonic diverticulosis. Stomach, small bowel, and large bowel are otherwise unremarkable. Appendix normal. No free intraperitoneal gas. Large volume ascites is present. Vascular/Lymphatic: No significant vascular findings are present. No enlarged abdominal or pelvic lymph nodes. Reproductive: Prostate is unremarkable. Other: Rectum unremarkable.  No abdominal wall hernia. Musculoskeletal: Degenerative changes are seen at the lumbosacral junction. No lytic or blastic bone lesion is identified. No acute bone abnormality. There is moderate subcutaneous edema noted within the flanks and within the visualized lower extremities bilaterally. IMPRESSION: Large volume ascites and moderate subcutaneous edema involving the flanks and visualized lower extremities bilaterally demonstrating a dependent, lower extremity gradient. While this can be seen in the setting of right heart failure, this is nonspecific. Trace bilateral pleural effusions. Moderate sigmoid diverticulosis. Electronically Signed   By: Fidela Salisbury MD   On: 04/27/2020 23:14   CT Chest Wo Contrast  Result Date: 04/27/2020 CLINICAL DATA:  Congestive heart failure, tachycardia, dyspnea, abdominal distension, lower extremity edema EXAM: CT CHEST, ABDOMEN AND PELVIS WITHOUT CONTRAST TECHNIQUE: Multidetector CT imaging of the chest, abdomen and pelvis was performed following the standard protocol without IV contrast. COMPARISON:  None. FINDINGS: CT CHEST FINDINGS Cardiovascular: Mild coronary artery calcification. Global cardiac size within normal limits. Small pericardial effusion. No CT evidence of cardiac tamponade. Central pulmonary arteries are of normal caliber. Thoracic aorta is unremarkable. Mediastinum/Nodes: Visualized thyroid unremarkable. No pathologic thoracic adenopathy.  Esophagus unremarkable. Lungs/Pleura: Trace right and small left pleural effusions are present with mild bibasilar atelectasis. Lungs are otherwise clear. No pneumothorax. Central airways are widely patent. Musculoskeletal: No acute bone abnormality. No lytic or blastic bone lesion. CT ABDOMEN PELVIS FINDINGS Hepatobiliary: No focal liver abnormality is seen. No gallstones, gallbladder wall thickening, or biliary dilatation. Pancreas: Unremarkable Spleen: Unremarkable Adrenals/Urinary Tract: Adrenal glands are unremarkable. Kidneys are normal, without renal calculi, focal lesion, or hydronephrosis. Bladder is unremarkable. Stomach/Bowel: Moderate descending and sigmoid colonic diverticulosis. Stomach, small bowel, and large bowel are otherwise unremarkable. Appendix normal. No free intraperitoneal gas. Large volume ascites is present. Vascular/Lymphatic: No significant vascular findings are present. No enlarged abdominal or pelvic lymph nodes. Reproductive: Prostate is unremarkable. Other: Rectum unremarkable.  No abdominal wall hernia. Musculoskeletal: Degenerative changes are seen at the lumbosacral junction. No lytic or blastic bone lesion is identified. No acute bone abnormality. There is moderate subcutaneous edema noted within the flanks and within the visualized lower extremities bilaterally. IMPRESSION: Large volume ascites and moderate subcutaneous edema involving the flanks and visualized lower extremities bilaterally demonstrating a dependent, lower extremity gradient. While this can be seen in the setting of right heart failure, this is nonspecific. Trace bilateral pleural effusions. Moderate sigmoid diverticulosis. Electronically Signed   By:  Fidela Salisbury MD   On: 04/27/2020 23:14   DG Chest Portable 1 View  Result Date: 04/27/2020 CLINICAL DATA:  Shortness of breath today.  History of CHF. EXAM: PORTABLE CHEST 1 VIEW COMPARISON:  11/01/2018 FINDINGS: Shallow inspiration with linear atelectasis in  the lung bases, greater on the left. Heart size and pulmonary vascularity are probably normal for technique. No pleural effusions. No pneumothorax. Mediastinal contours appear intact. IMPRESSION: Shallow inspiration with linear atelectasis in the lung bases, greater on the left. Electronically Signed   By: Lucienne Capers M.D.   On: 04/27/2020 20:42   I reviewed all nursing notes, pharmacy notes, vitals, pertinent old records.  Assessment/Plan 1.  Acute HFpEF Significantly volume overloaded. We will treat with IV Lasix. Along with that we will also provide IV albumin. Echocardiogram August 2021 shows preserved EF without any significant abnormality.  RV is also normal. Stress test negative as well. Monitor response.  2.  Hypoalbuminemia Etiology not clear. Likely cause of patient's ascites and anasarca. Monitor for now.  3.  Acute kidney injury with hyperkalemia. Metabolic acidosis. Family combination of venous congestion, cardiorenal hemodynamics as well as lisinopril, Lasix and potassium supplements. Foley catheter for close urine output monitoring. No evidence of obstruction seen on CT scan. Monitor renal function Further work-up with osmolality, urine sodium urine creatinine for further work-up. Check CK. Avoid nephrotoxic medication. IV albumin support.  4.  Ascites. Likely secondary to patient's lupus. IR guided paracentesis. 2 L max. IV albumin support.  5.  Lupus. Patient likely noncompliant with medical regimen. His presentation can also be secondary to lupus nephritis. We will follow up on further work-up and recommendation from nephrology. Monitor.  5.  Chronic pancytopenia specifically leukopenia. Check differential endocrine dyspnea. Monitor. Patient has seen oncology in the past as well as underwent bone marrow biopsy. No further work-up for now.  6.  Hypothyroidism Check TSH and free T4: Continue Synthroid.  Nutrition: Cardiac diet DVT Prophylaxis:  Subcutaneous Heparin   Advance goals of care discussion: Full code   Consults: EDP discussed with nephrology.  Family Communication: no family was present at bedside, at the time of interview.   Disposition:  From: Home Likely will need Home on discharge.   Author: Berle Mull, MD [facility] 04/27/2020 11:59 PM   To reach On-call, see care teams to locate the attending and reach out to them via [email or web address]. If 7PM-7AM, please contact night-coverage If you still have difficulty reaching the attending provider, please page the [facility] (Director on Call) for [facility] on amion for assistance.

## 2020-04-27 NOTE — ED Provider Notes (Signed)
[facility] DEPT Provider Note   CSN: [id number] Arrival date & time: 04/27/20  1943     History Chief Complaint  Patient presents with  . Abdominal Pain  . Leg Swelling    [name] is a 50 y.o. male.  HPI Patient is a 50 year old male with a history of lupus, stage IIIa CKD, hypothyroidism, elevated LFTs, and presents the emergency department due to leg swelling.  Patient reports a history of leg swelling in the past.  He was started on Lasix and this typically resolves his symptoms.  His symptoms began worsening about 2 weeks ago.  They have now spread to his abdomen.  He reports a significantly protuberant abdomen that is diffusely tender.  He states he has never experienced swelling the abdomen before.  He periodically drinks alcohol but denies any regular alcohol use.  He states that he has been having a normal amount of fluid intake but has had decreased urination for the past 2 weeks.  Has continued to take Lasix without any relief.  Reports some mild shortness of breath, dry cough, as well as orthopnea.  No fevers or chest pain.  Echocardiogram on 12/14/2019:  1. Left ventricular ejection fraction, by estimation, is 60 to 65%. The  left ventricle has normal function. The left ventricle has no regional  wall motion abnormalities. Left ventricular diastolic parameters are  indeterminate.  2. Right ventricular systolic function is normal. The right ventricular  size is normal. There is normal pulmonary artery systolic pressure.      Past Medical History:  Diagnosis Date  . Lupus [facility])     Patient Active Problem List   Diagnosis Date Noted  . Stage 3a chronic kidney disease ([place]) 01/15/2020  . Acquired hypothyroidism 11/27/2019  . Cardiac murmur 11/24/2019  . Congestive heart failure ([place]) 11/06/2019  . Elevated TSH 11/06/2019  . Folate deficiency 04/11/2019  . B12 deficiency 04/11/2019  . Pancytopenia ([place]) 02/06/2019  . Alcohol  use 02/06/2019  . Elevated LFTs 02/06/2019  . Systemic lupus erythematosus with lung involvement ([place]) 12/05/2018  . Vitamin D deficiency 12/05/2018  . False positive interferon-gamma release assay (IGRA) for tuberculosis 12/05/2018  . Neurotic excoriations 11/01/2018  . Encounter for health maintenance examination with abnormal findings 11/01/2018  . Polyarthralgia 06/11/2015  . Pleural effusion   . S/P thoracentesis   . Lupus ([place]) 03/10/2015  . Dyspnea 03/10/2015  . Lupus (systemic lupus erythematosus) ([place]) 03/09/2015    No past surgical history on file.     Family History  Problem Relation Age of Onset  . Cancer Mother     Social History   Tobacco Use  . Smoking status: Never Smoker  . Smokeless tobacco: Never Used  Vaping Use  . Vaping Use: Never used  Substance Use Topics  . Alcohol use: Yes    Comment: 2-4 glasses of wine 2-3 times weekly  . Drug use: No    Home Medications Prior to Admission medications   Medication Sig Start Date End Date Taking? Authorizing Provider  folic acid (FOLVITE) 1 MG tablet Take 1 tablet by mouth once daily 01/16/20  Yes [name], MD  furosemide (LASIX) 40 MG tablet TAKE 1 TABLET BY MOUTH IN THE MORNING AND  1/2  (ONE-HALF)  TABLET  BY  MOUTH  IN  THE  LATE  AFTERNOON 04/09/20  Yes [name], MD  ibuprofen (ADVIL,MOTRIN) 200 MG tablet Take 200 mg by mouth every 6 (six) hours as needed (  pain).   Yes [provider]  levothyroxine (SYNTHROID) 50 MCG tablet TAKE 1 TABLET BY MOUTH ONCE DAILY BEFORE BREAKFAST 01/16/20  Yes [name], MD  lisinopril (ZESTRIL) 20 MG tablet Take 1 tablet (20 mg total) by mouth daily. 11/27/19  Yes [name], MD  potassium chloride SA (KLOR-CON) 20 MEQ tablet Take 1 tablet (20 mEq total) by mouth daily. 11/06/19  Yes [name], MD  vitamin B-12 (CYANOCOBALAMIN) 1000 MCG tablet Take 1,000 mcg by mouth daily. 04/09/20  Yes [provider]  Vitamin D, Ergocalciferol, (DRISDOL) 1.25 MG (50000 UNIT) CAPS capsule Take 1 capsule (50,000 Units total) by mouth every 7 (seven) days. 11/12/19  Yes [name], MD  Cyanocobalamin (B-12) 1000 MCG CAPS Take one daily Patient not taking: Reported on 04/27/2020 11/27/19   [name], MD    Allergies    Patient has no known allergies.  Review of Systems   Review of Systems  All other systems reviewed and are negative. Ten systems reviewed and are negative for acute change, except as noted in the HPI.    Physical Exam Updated Vital Signs Ht 5\' 11"  (1.803 m)   Wt 81.6 kg   BMI 25.10 kg/m   Physical Exam Vitals and nursing note reviewed.  Constitutional:      General: He is not in acute distress.    Appearance: Normal appearance. He is not ill-appearing, toxic-appearing or diaphoretic.  HENT:     Head: Normocephalic and atraumatic.     Right Ear: External ear normal.     Left Ear: External ear normal.     Nose: Nose normal.     Mouth/Throat:     Mouth: Mucous membranes are moist.     Pharynx: Oropharynx is clear. No oropharyngeal exudate or posterior oropharyngeal erythema.  Eyes:     Extraocular Movements: Extraocular movements intact.  Cardiovascular:     Rate and Rhythm: Regular rhythm. Tachycardia present.     Pulses: Normal pulses.     Heart sounds: Normal heart sounds. No murmur heard. No friction rub. No gallop.   Pulmonary:     Effort: Pulmonary effort is normal. No respiratory distress.     Breath sounds: No stridor. Rales present. No wheezing or rhonchi.  Abdominal:     General: Abdomen is flat and protuberant. There is distension.     Palpations: There is fluid wave.     Tenderness: There is generalized abdominal tenderness.  Musculoskeletal:        General: Normal range of motion.     Cervical back: Normal range of motion and neck supple. No tenderness.     Comments: 3-4+ pitting edema noted in the bilateral lower  extremities.  Palpable pedal pulses.  No visible cellulitis.  Skin:    General: Skin is warm and dry.  Neurological:     General: No focal deficit present.     Mental Status: He is alert and oriented to person, place, and time.  Psychiatric:        Mood and Affect: Mood normal.        Behavior: Behavior normal.    ED Results / Procedures / Treatments   Labs (all labs ordered are listed, but only abnormal results are displayed) Labs Reviewed  COMPREHENSIVE METABOLIC PANEL - Abnormal; Notable for the following components:      Result Value   Sodium 133 (*)    Potassium 5.3 (*)    Chloride 113 (*)  CO2 11 (*)    BUN 82 (*)    Creatinine, Ser 4.19 (*)    Calcium 7.3 (*)    Total Protein 5.6 (*)    Albumin 1.6 (*)    GFR, Estimated 16 (*)    All other components within normal limits  CBC WITH DIFFERENTIAL/PLATELET - Abnormal; Notable for the following components:   WBC 2.8 (*)    RBC 2.85 (*)    Hemoglobin 8.6 (*)    HCT 25.9 (*)    Lymphs Abs 0.4 (*)    All other components within normal limits  RESP PANEL BY RT-PCR (FLU A&B, COVID) ARPGX2  BRAIN NATRIURETIC PEPTIDE  URINALYSIS, ROUTINE W REFLEX MICROSCOPIC   EKG EKG Interpretation  Date/Time:  Saturday April 27 2020 20:44:05 EST Ventricular Rate:  102 PR Interval:    QRS Duration: 88 QT Interval:  329 QTC Calculation: 429 R Axis:   4 Text Interpretation: Sinus tachycardia Probable left atrial enlargement Low voltage, extremity leads No significant change since last tracing Confirmed by Wandra Arthurs [phone]) on 04/27/2020 9:29:33 PM  Radiology CT ABDOMEN PELVIS WO CONTRAST  Result Date: 04/27/2020 CLINICAL DATA:  Congestive heart failure, tachycardia, dyspnea, abdominal distension, lower extremity edema EXAM: CT CHEST, ABDOMEN AND PELVIS WITHOUT CONTRAST TECHNIQUE: Multidetector CT imaging of the chest, abdomen and pelvis was performed following the standard protocol without IV contrast. COMPARISON:  None. FINDINGS:  CT CHEST FINDINGS Cardiovascular: Mild coronary artery calcification. Global cardiac size within normal limits. Small pericardial effusion. No CT evidence of cardiac tamponade. Central pulmonary arteries are of normal caliber. Thoracic aorta is unremarkable. Mediastinum/Nodes: Visualized thyroid unremarkable. No pathologic thoracic adenopathy. Esophagus unremarkable. Lungs/Pleura: Trace right and small left pleural effusions are present with mild bibasilar atelectasis. Lungs are otherwise clear. No pneumothorax. Central airways are widely patent. Musculoskeletal: No acute bone abnormality. No lytic or blastic bone lesion. CT ABDOMEN PELVIS FINDINGS Hepatobiliary: No focal liver abnormality is seen. No gallstones, gallbladder wall thickening, or biliary dilatation. Pancreas: Unremarkable Spleen: Unremarkable Adrenals/Urinary Tract: Adrenal glands are unremarkable. Kidneys are normal, without renal calculi, focal lesion, or hydronephrosis. Bladder is unremarkable. Stomach/Bowel: Moderate descending and sigmoid colonic diverticulosis. Stomach, small bowel, and large bowel are otherwise unremarkable. Appendix normal. No free intraperitoneal gas. Large volume ascites is present. Vascular/Lymphatic: No significant vascular findings are present. No enlarged abdominal or pelvic lymph nodes. Reproductive: Prostate is unremarkable. Other: Rectum unremarkable.  No abdominal wall hernia. Musculoskeletal: Degenerative changes are seen at the lumbosacral junction. No lytic or blastic bone lesion is identified. No acute bone abnormality. There is moderate subcutaneous edema noted within the flanks and within the visualized lower extremities bilaterally. IMPRESSION: Large volume ascites and moderate subcutaneous edema involving the flanks and visualized lower extremities bilaterally demonstrating a dependent, lower extremity gradient. While this can be seen in the setting of right heart failure, this is nonspecific. Trace bilateral  pleural effusions. Moderate sigmoid diverticulosis. Electronically Signed   By: Fidela Salisbury MD   On: 04/27/2020 23:14   CT Chest Wo Contrast  Result Date: 04/27/2020 CLINICAL DATA:  Congestive heart failure, tachycardia, dyspnea, abdominal distension, lower extremity edema EXAM: CT CHEST, ABDOMEN AND PELVIS WITHOUT CONTRAST TECHNIQUE: Multidetector CT imaging of the chest, abdomen and pelvis was performed following the standard protocol without IV contrast. COMPARISON:  None. FINDINGS: CT CHEST FINDINGS Cardiovascular: Mild coronary artery calcification. Global cardiac size within normal limits. Small pericardial effusion. No CT evidence of cardiac tamponade. Central pulmonary arteries are of normal caliber. Thoracic aorta is  unremarkable. Mediastinum/Nodes: Visualized thyroid unremarkable. No pathologic thoracic adenopathy. Esophagus unremarkable. Lungs/Pleura: Trace right and small left pleural effusions are present with mild bibasilar atelectasis. Lungs are otherwise clear. No pneumothorax. Central airways are widely patent. Musculoskeletal: No acute bone abnormality. No lytic or blastic bone lesion. CT ABDOMEN PELVIS FINDINGS Hepatobiliary: No focal liver abnormality is seen. No gallstones, gallbladder wall thickening, or biliary dilatation. Pancreas: Unremarkable Spleen: Unremarkable Adrenals/Urinary Tract: Adrenal glands are unremarkable. Kidneys are normal, without renal calculi, focal lesion, or hydronephrosis. Bladder is unremarkable. Stomach/Bowel: Moderate descending and sigmoid colonic diverticulosis. Stomach, small bowel, and large bowel are otherwise unremarkable. Appendix normal. No free intraperitoneal gas. Large volume ascites is present. Vascular/Lymphatic: No significant vascular findings are present. No enlarged abdominal or pelvic lymph nodes. Reproductive: Prostate is unremarkable. Other: Rectum unremarkable.  No abdominal wall hernia. Musculoskeletal: Degenerative changes are seen at the  lumbosacral junction. No lytic or blastic bone lesion is identified. No acute bone abnormality. There is moderate subcutaneous edema noted within the flanks and within the visualized lower extremities bilaterally. IMPRESSION: Large volume ascites and moderate subcutaneous edema involving the flanks and visualized lower extremities bilaterally demonstrating a dependent, lower extremity gradient. While this can be seen in the setting of right heart failure, this is nonspecific. Trace bilateral pleural effusions. Moderate sigmoid diverticulosis. Electronically Signed   By: Fidela Salisbury MD   On: 04/27/2020 23:14   DG Chest Portable 1 View  Result Date: 04/27/2020 CLINICAL DATA:  Shortness of breath today.  History of CHF. EXAM: PORTABLE CHEST 1 VIEW COMPARISON:  11/01/2018 FINDINGS: Shallow inspiration with linear atelectasis in the lung bases, greater on the left. Heart size and pulmonary vascularity are probably normal for technique. No pleural effusions. No pneumothorax. Mediastinal contours appear intact. IMPRESSION: Shallow inspiration with linear atelectasis in the lung bases, greater on the left. Electronically Signed   By: Lucienne Capers M.D.   On: 04/27/2020 20:42   Procedures Procedures (including critical care time)  Medications Ordered in ED Medications  furosemide (LASIX) injection 40 mg (40 mg Intravenous Given 04/27/20 2029)   ED Course  I have reviewed the triage vital signs and the nursing notes.  Pertinent labs & imaging results that were available during my care of the patient were reviewed by me and considered in my medical decision making (see chart for details).  Clinical Course as of 04/27/20 2332  Sat Apr 27, 2020  2119 WBC(!): 2.8 Based on records it appears patient has chronic leukopenia [LJ]  2119 Hemoglobin(!): 8.6 Down from 10.4, 3 months ago. [LJ]  2119 DG Chest Portable 1 View IMPRESSION: Shallow inspiration with linear atelectasis in the lung bases, greater on  the left. [LJ]  2134 B Natriuretic Peptide: 36.5 [LJ]  2134 SARS Coronavirus 2 by RT PCR: NEGATIVE [LJ]  2207 Creatinine(!): 4.19 [LJ]  2207 GFR, Estimated(!): 16 [LJ]  2234 BUN(!): 82 [LJ]    Clinical Course User Index [LJ] Rayna Sexton, PA-C    MDM Rules/Calculators/A&P                          Patient is a 50 year old male with a history of lupus, CKD 3, who presents to the emergency department due to worsening leg swelling as well as new onset ascites.  Appears that his symptoms have been progressing for the past 2 weeks.  Reports decreased urine output as well as mild shortness of breath.  Sodium 133, elevated potassium of 5.3, elevated chloride of 113.  Decreased CO2 at 11.  Uremic with a BUN at 82.  Significantly elevated creatinine at 4.19 with a GFR of 16.  Appears to be in acute renal failure.  Decreased calcium at 7.3.  Decreased albumin at 1.6.  Patient has a history of pancytopenia.  He is leukopenic at 2.8.  Hemoglobin down to 8.6.  No elevation in BNP.  LFTs within normal limits.  Respiratory panel was negative.  Is elevated creatinine obtain a CT scan without contrast of the chest, abdomen, and pelvis.  Findings as noted below:  Large volume ascites and moderate subcutaneous edema involving the  flanks and visualized lower extremities bilaterally demonstrating a  dependent, lower extremity gradient. While this can be seen in the  setting of right heart failure, this is nonspecific.    Trace bilateral pleural effusions.    Moderate sigmoid diverticulosis.   Given patient's worsening renal function as well as his large volume ascites, feel that admission is warranted.  Patient has been evaluated as well as discussed with my attending physician who was in agreement.  Patient is amenable with admission.  We will discuss with the hospitalist team for likely admission.  I sent a secure chat to Dr. Jonnie Finner with nephrology to him aware of patient's admission as well.    Final Clinical Impression(s) / ED Diagnoses Final diagnoses:  AKI (acute kidney injury) ([place])  Other ascites  Leg swelling    Rx / DC Orders ED Discharge Orders    None       Rayna Sexton, PA-C 04/27/20 2333    Drenda Freeze, MD 04/28/20 1504

## 2020-04-27 NOTE — ED Triage Notes (Signed)
Pt came in from home with c/o ascites and leg swelling. Pt has hx of CHF. Pt has 4+ pitting edema bilat with some brawny discoloration. Pt c/o abdominal pressure. Pt also c/o slight SOB. Pt tachycardic with HR 110

## 2020-04-28 ENCOUNTER — Encounter (HOSPITAL_COMMUNITY): Payer: Self-pay | Admitting: Internal Medicine

## 2020-04-28 ENCOUNTER — Inpatient Hospital Stay (HOSPITAL_COMMUNITY): Payer: BC Managed Care – PPO

## 2020-04-28 ENCOUNTER — Other Ambulatory Visit: Payer: Self-pay

## 2020-04-28 DIAGNOSIS — M328 Other forms of systemic lupus erythematosus: Secondary | ICD-10-CM | POA: Diagnosis not present

## 2020-04-28 DIAGNOSIS — N1831 Chronic kidney disease, stage 3a: Secondary | ICD-10-CM

## 2020-04-28 DIAGNOSIS — I361 Nonrheumatic tricuspid (valve) insufficiency: Secondary | ICD-10-CM | POA: Diagnosis not present

## 2020-04-28 DIAGNOSIS — M3213 Lung involvement in systemic lupus erythematosus: Secondary | ICD-10-CM

## 2020-04-28 DIAGNOSIS — R188 Other ascites: Secondary | ICD-10-CM

## 2020-04-28 DIAGNOSIS — R0602 Shortness of breath: Secondary | ICD-10-CM | POA: Diagnosis not present

## 2020-04-28 DIAGNOSIS — I5031 Acute diastolic (congestive) heart failure: Secondary | ICD-10-CM

## 2020-04-28 DIAGNOSIS — N179 Acute kidney failure, unspecified: Secondary | ICD-10-CM | POA: Diagnosis not present

## 2020-04-28 LAB — CBC WITH DIFFERENTIAL/PLATELET
Abs Immature Granulocytes: 0.01 10*3/uL (ref 0.00–0.07)
Basophils Absolute: 0 10*3/uL (ref 0.0–0.1)
Basophils Relative: 0 %
Eosinophils Absolute: 0.1 10*3/uL (ref 0.0–0.5)
Eosinophils Relative: 3 %
HCT: 25 % — ABNORMAL LOW (ref 39.0–52.0)
Hemoglobin: 8.3 g/dL — ABNORMAL LOW (ref 13.0–17.0)
Immature Granulocytes: 0 %
Lymphocytes Relative: 6 %
Lymphs Abs: 0.2 10*3/uL — ABNORMAL LOW (ref 0.7–4.0)
MCH: 30.3 pg (ref 26.0–34.0)
MCHC: 33.2 g/dL (ref 30.0–36.0)
MCV: 91.2 fL (ref 80.0–100.0)
Monocytes Absolute: 0.2 10*3/uL (ref 0.1–1.0)
Monocytes Relative: 8 %
Neutro Abs: 2.1 10*3/uL (ref 1.7–7.7)
Neutrophils Relative %: 83 %
Platelets: 166 10*3/uL (ref 150–400)
RBC: 2.74 MIL/uL — ABNORMAL LOW (ref 4.22–5.81)
RDW: 12.7 % (ref 11.5–15.5)
WBC: 2.6 10*3/uL — ABNORMAL LOW (ref 4.0–10.5)
nRBC: 0 % (ref 0.0–0.2)

## 2020-04-28 LAB — RENAL FUNCTION PANEL
Albumin: 1.9 g/dL — ABNORMAL LOW (ref 3.5–5.0)
Anion gap: 8 (ref 5–15)
BUN: 84 mg/dL — ABNORMAL HIGH (ref 6–20)
CO2: 14 mmol/L — ABNORMAL LOW (ref 22–32)
Calcium: 7.6 mg/dL — ABNORMAL LOW (ref 8.9–10.3)
Chloride: 113 mmol/L — ABNORMAL HIGH (ref 98–111)
Creatinine, Ser: 4.19 mg/dL — ABNORMAL HIGH (ref 0.61–1.24)
GFR, Estimated: 16 mL/min — ABNORMAL LOW (ref >60)
Glucose, Bld: 114 mg/dL — ABNORMAL HIGH (ref 70–99)
Phosphorus: 8.8 mg/dL — ABNORMAL HIGH (ref 2.5–4.6)
Potassium: 5.4 mmol/L — ABNORMAL HIGH (ref 3.5–5.1)
Sodium: 135 mmol/L (ref 135–145)

## 2020-04-28 LAB — BODY FLUID CELL COUNT WITH DIFFERENTIAL
Lymphs, Fluid: 19 %
Monocyte-Macrophage-Serous Fluid: 33 % — ABNORMAL LOW (ref 50–90)
Neutrophil Count, Fluid: 48 % — ABNORMAL HIGH (ref 0–25)
Total Nucleated Cell Count, Fluid: 163 cu mm (ref 0–1000)

## 2020-04-28 LAB — URINALYSIS, COMPLETE (UACMP) WITH MICROSCOPIC
Bilirubin Urine: NEGATIVE
Glucose, UA: NEGATIVE mg/dL
Ketones, ur: NEGATIVE mg/dL
Nitrite: NEGATIVE
Protein, ur: >=300 mg/dL — AB
Specific Gravity, Urine: 1.015 (ref 1.005–1.030)
WBC, UA: >50 WBC/hpf — ABNORMAL HIGH (ref 0–5)
pH: 5 (ref 5.0–8.0)

## 2020-04-28 LAB — GRAM STAIN

## 2020-04-28 LAB — CREATININE, URINE, RANDOM: Creatinine, Urine: 378.67 mg/dL

## 2020-04-28 LAB — ECHOCARDIOGRAM COMPLETE
Area-P 1/2: 2.03 cm2
Height: 71 in
S' Lateral: 3.4 cm
Weight: 3182.4 oz

## 2020-04-28 LAB — OSMOLALITY, URINE: Osmolality, Ur: 318 mOsm/kg (ref 300–900)

## 2020-04-28 LAB — NA AND K (SODIUM & POTASSIUM), RAND UR
Potassium Urine: 45 mmol/L
Sodium, Ur: 22 mmol/L

## 2020-04-28 LAB — TSH: TSH: 7.851 u[IU]/mL — ABNORMAL HIGH (ref 0.350–4.500)

## 2020-04-28 LAB — HIV ANTIBODY (ROUTINE TESTING W REFLEX): HIV Screen 4th Generation wRfx: NONREACTIVE

## 2020-04-28 LAB — PROTEIN, PLEURAL OR PERITONEAL FLUID: Total protein, fluid: <3 g/dL

## 2020-04-28 LAB — TECHNOLOGIST SMEAR REVIEW: Plt Morphology: NORMAL

## 2020-04-28 LAB — LACTATE DEHYDROGENASE, PLEURAL OR PERITONEAL FLUID: LD, Fluid: 60 U/L — ABNORMAL HIGH (ref 3–23)

## 2020-04-28 LAB — ALBUMIN, PLEURAL OR PERITONEAL FLUID: Albumin, Fluid: 0.8 g/dL

## 2020-04-28 LAB — T4, FREE: Free T4: 0.89 ng/dL (ref 0.61–1.12)

## 2020-04-28 LAB — OSMOLALITY: Osmolality: 312 mOsm/kg — ABNORMAL HIGH (ref 275–295)

## 2020-04-28 LAB — CK: Total CK: 65 U/L (ref 49–397)

## 2020-04-28 MED ORDER — SODIUM CHLORIDE 0.9% FLUSH
3.0000 mL | Freq: Two times a day (BID) | INTRAVENOUS | Status: DC
  Administered 2020-04-28 – 2020-05-11 (×23): 3 mL via INTRAVENOUS

## 2020-04-28 MED ORDER — ALBUMIN HUMAN 25 % IV SOLN: 12.5000 g | Freq: Once | INTRAVENOUS | Status: DC

## 2020-04-28 MED ORDER — HEPARIN SODIUM (PORCINE) 5000 UNIT/ML IJ SOLN
5000.0000 [IU] | Freq: Three times a day (TID) | INTRAMUSCULAR | Status: DC
  Administered 2020-04-28 – 2020-04-29 (×5): 5000 [IU] via SUBCUTANEOUS
  Filled 2020-04-28 (×5): qty 1

## 2020-04-28 MED ORDER — FOLIC ACID 1 MG PO TABS
1.0000 mg | ORAL_TABLET | Freq: Every day | ORAL | Status: DC
  Administered 2020-04-28 – 2020-05-11 (×14): 1 mg via ORAL
  Filled 2020-04-28 (×14): qty 1

## 2020-04-28 MED ORDER — CHLORHEXIDINE GLUCONATE CLOTH 2 % EX PADS
6.0000 | MEDICATED_PAD | Freq: Every day | CUTANEOUS | Status: DC
  Administered 2020-04-29 – 2020-05-09 (×8): 6 via TOPICAL

## 2020-04-28 MED ORDER — ACETAMINOPHEN 325 MG PO TABS
650.0000 mg | ORAL_TABLET | ORAL | Status: DC | PRN
  Administered 2020-04-28: 650 mg via ORAL
  Filled 2020-04-28: qty 2

## 2020-04-28 MED ORDER — FUROSEMIDE 10 MG/ML IJ SOLN
80.0000 mg | Freq: Two times a day (BID) | INTRAMUSCULAR | Status: DC
  Administered 2020-04-28 – 2020-04-29 (×2): 80 mg via INTRAVENOUS
  Filled 2020-04-28 (×2): qty 8

## 2020-04-28 MED ORDER — SODIUM CHLORIDE 0.9% FLUSH: 3.0000 mL | INTRAVENOUS | Status: DC | PRN

## 2020-04-28 MED ORDER — SODIUM CHLORIDE 0.9 % IV SOLN: 250.0000 mL | INTRAVENOUS | Status: DC | PRN

## 2020-04-28 MED ORDER — LEVOTHYROXINE SODIUM 50 MCG PO TABS
50.0000 ug | ORAL_TABLET | Freq: Every day | ORAL | Status: DC
  Administered 2020-04-28 – 2020-05-10 (×13): 50 ug via ORAL
  Filled 2020-04-28 (×14): qty 1

## 2020-04-28 MED ORDER — LIDOCAINE HCL 1 % IJ SOLN
INTRAMUSCULAR | Status: AC
  Filled 2020-04-28: qty 20

## 2020-04-28 MED ORDER — OXYCODONE-ACETAMINOPHEN 5-325 MG PO TABS
1.0000 | ORAL_TABLET | Freq: Four times a day (QID) | ORAL | Status: DC | PRN
  Administered 2020-04-28 – 2020-05-11 (×33): 2 via ORAL
  Filled 2020-04-28 (×34): qty 2

## 2020-04-28 MED ORDER — VITAMIN B-12 1000 MCG PO TABS
1000.0000 ug | ORAL_TABLET | Freq: Every day | ORAL | Status: DC
  Administered 2020-04-28 – 2020-05-11 (×14): 1000 ug via ORAL
  Filled 2020-04-28 (×14): qty 1

## 2020-04-28 MED ORDER — ONDANSETRON HCL 4 MG/2ML IJ SOLN
4.0000 mg | Freq: Four times a day (QID) | INTRAMUSCULAR | Status: DC | PRN
  Administered 2020-04-29 – 2020-05-09 (×24): 4 mg via INTRAVENOUS
  Filled 2020-04-28 (×25): qty 2

## 2020-04-28 MED ORDER — FLAVOXATE HCL 100 MG PO TABS
100.0000 mg | ORAL_TABLET | Freq: Three times a day (TID) | ORAL | Status: DC | PRN
  Filled 2020-04-28: qty 1

## 2020-04-28 MED ORDER — ALBUMIN HUMAN 25 % IV SOLN
12.5000 g | Freq: Four times a day (QID) | INTRAVENOUS | Status: AC
  Administered 2020-04-28 (×4): 12.5 g via INTRAVENOUS
  Filled 2020-04-28 (×4): qty 50

## 2020-04-28 MED ORDER — FUROSEMIDE 10 MG/ML IJ SOLN
40.0000 mg | Freq: Two times a day (BID) | INTRAMUSCULAR | Status: DC
  Administered 2020-04-28: 40 mg via INTRAVENOUS
  Filled 2020-04-28: qty 4

## 2020-04-28 MED ORDER — TRAMADOL HCL 50 MG PO TABS
50.0000 mg | ORAL_TABLET | Freq: Once | ORAL | Status: AC
Start: 2020-04-28 — End: 2020-04-28
  Administered 2020-04-28: 50 mg via ORAL
  Filled 2020-04-28: qty 1

## 2020-04-28 NOTE — Progress Notes (Signed)
Foley cath inserted per MD order. Patient complain of discomfort, tylenol given per prn. Pt complain of discomfort again this morning. Asked how long the foley will stay and said he does not need a foley.Np on call made aware of pt discomfort, new order given. Pt sleeping at this time.

## 2020-04-28 NOTE — ED Notes (Signed)
Report called to [place], RN on 4th floor

## 2020-04-28 NOTE — Consult Note (Signed)
Renal Service Consult Note [name]  [name] 04/28/2020 [name], MD Requesting Physician: Dr. Louanne Belton, L.   Reason for Consult: Renal failure HPI: The patient is a 50 y.o. year-old w/ hx of lupus, hypothyroidism, B12 def, pancytopenia presented w/ c/o SOB, abd distension and leg swelling getting worse over 3 wks period. Stopped his imuran on his own 3-4 mos ago and not taking any prednisone now. In ED pt noted to have marked abd distension and leg swelling. CT showed +ascites and anasarca. Labs showed low albumin and renal failure w/ creat 4.19, BUN 84, K 5.4, CO2 14.  Asked to see for renal failure.   Patient seen in the room.  Had SLE in the past w/ primarily what sounds like pleural and pericardial symptoms, he says he was treated with thoracentesis, prednisone, toradol. No hx lupus kidney involvement.   Foley cath placed. No SOB at rest, no chest pain. No fevers.   ROS  denies CP  no joint pain   no HA  no blurry vision  no rash  no diarrhea  no nausea/ vomiting  no dysuria  no difficulty voiding  no change in urine color    Past Medical History  Past Medical History:  Diagnosis Date  . Lupus [facility])    Past Surgical History History reviewed. No pertinent surgical history. Family History  Family History  Problem Relation Age of Onset  . Cancer Mother    Social History  reports that he has never smoked. He has never used smokeless tobacco. He reports current alcohol use. He reports that he does not use drugs. Allergies No Known Allergies Home medications Prior to Admission medications   Medication Sig Start Date End Date Taking? Authorizing Provider  folic acid (FOLVITE) 1 MG tablet Take 1 tablet by mouth once daily 01/16/20  Yes [name], MD  furosemide (LASIX) 40 MG tablet TAKE 1 TABLET BY MOUTH IN THE MORNING AND  1/2  (ONE-HALF)  TABLET  BY  MOUTH  IN  THE  LATE  AFTERNOON 04/09/20  Yes [name], MD   ibuprofen (ADVIL,MOTRIN) 200 MG tablet Take 200 mg by mouth every 6 (six) hours as needed (pain).   Yes [provider]  levothyroxine (SYNTHROID) 50 MCG tablet TAKE 1 TABLET BY MOUTH ONCE DAILY BEFORE BREAKFAST 01/16/20  Yes [name], MD  lisinopril (ZESTRIL) 20 MG tablet Take 1 tablet (20 mg total) by mouth daily. 11/27/19  Yes [name], MD  potassium chloride SA (KLOR-CON) 20 MEQ tablet Take 1 tablet (20 mEq total) by mouth daily. 11/06/19  Yes [name], MD  vitamin B-12 (CYANOCOBALAMIN) 1000 MCG tablet Take 1,000 mcg by mouth daily. 04/09/20  Yes [provider]  Vitamin D, Ergocalciferol, (DRISDOL) 1.25 MG (50000 UNIT) CAPS capsule Take 1 capsule (50,000 Units total) by mouth every 7 (seven) days. 11/12/19  Yes [name], MD  Cyanocobalamin (B-12) 1000 MCG CAPS Take one daily Patient not taking: Reported on 04/27/2020 11/27/19   [name], MD     Vitals:   04/28/20 0310 04/28/20 0609 04/28/20 0614 04/28/20 1306  BP: (!) 150/97 (!) 155/107  (!) 155/100  Pulse: (!) 106 (!) 103  84  Resp: _0 Temp: 97.8 F (36.6 C) (!) 97.4 F (36.3 C)  (!) 97.5 F (36.4 C)  TempSrc: Oral Oral  Oral  SpO2: 100% 100%  100%  Weight:   90.2 kg  Height:       Exam Gen chron ill appearing, not in distress No rash, cyanosis or gangrene Sclera anicteric, throat clear  No jvd or bruits Chest clear bilat RRR no MRG Abd soft, +prob ascites 1-2+, nontender abd GU normal male w/ foley cath draining clear amber urine MS no joint effusions or deformity Ext diffuse bilat LE edema Neuro is alert, Ox 3 , nf, no asterixis    Home meds:  - lasix 40 am, 20 pm/ lisinopril 20 qd/ Kdur 20 qd  - synthroid 50 ug  - advil 200 qid prn  - prn's/ vitamins/ supplements     UA 1/2 - > 300 prot, 21-50 rbc, >50 wbc, 0-5 epi, few bact    UNa 22,  UCr 378     CT abd noncont 1/1 - Adrenals/Urinary Tract: Adrenal glands are  unremarkable. Kidneys are normal, without renal calculi, focal lesion, or hydronephrosis. Bladder is unremarkable.       CT abd / general >> large volume ascites and moderate subcutaneous edema involving the flanks and visualized lower extremities bilaterally demonstrating a dependent, lower extremity gradient. While this can be seen in the setting of right heart failure, this is nonspecific.      CT chest wo contrast - Lungs/Pleura: Trace right and small left pleural effusions are present with mild bibasilar atelectasis. Lungs are otherwise clear. No pneumothorax. Central airways are widely patent. Trace bilateral pleural effusions. Moderate sigmoid diverticulosis.    CXR -  IMPRESSION: Shallow inspiration with linear atelectasis in the lung bases, greater on the left.    ECHO - Left ventricular ejection fraction, by estimation, is 55 to 60%. The  left ventricle has normal function. The left ventricle has no regional  wall motion abnormalities. There is moderate left ventricular hypertrophy.  Left ventricular diastolic parameters were normal.        Na 135  Creat 4.19  BUN 84   K 5.4   CO2 14       WBC 2.6  Hb 8.3  plt 166        Date  Creat  eGFR        10/2019  0.79  103        11/2019  0.93  86        12/2019  1.52  48        04/27/2020  4.19  16         1/2/ 2022  4.19  16   Assessment/ Plan: 1. Renal failure - renal decline since Sept 2021 apparently. Vol overload may be secondary to renal failure, vs HF / cardiorenal syndrome causing AKI/ CKD, however echo today not showing syst or diastolic changes.  UA +protein +microhematuria.  Could have lupus nephritis w/ these findings.  Send off serologies, complements, start IV lasix for vol removal, quantify proteinuria, consider biopsy. Foley in place. Will follow.   2. HTN - BP's high. Avoid acei/ ARB for now.  3. H/o lupus - stopped imuran 3-4 mos ago, hx of pleuropericardial disease mostly.  4. Pancytopenia      Kelly Splinter  MD 04/28/2020,  6:38 PM  Recent Labs  Lab 04/27/20 2016 04/28/20 0431  WBC 2.8* 2.6*  HGB 8.6* 8.3*   Recent Labs  Lab 04/27/20 2016 04/28/20 0431  K 5.3* 5.4*  BUN 82* 84*  CREATININE 4.19* 4.19*  CALCIUM 7.3* 7.6*  PHOS  --  8.8*

## 2020-04-28 NOTE — ED Notes (Signed)
Pt. Documented in error see above note in chart. 

## 2020-04-28 NOTE — Procedures (Signed)
PROCEDURE SUMMARY:  Successful image-guided paracentesis from the right lateral abdomen.  Yielded 2.0 liters of clear yellow fluid.  No immediate complications.  EBL = 0 mL. Patient tolerated well.   Specimen was sent for labs.  Please see imaging section of Epic for full dictation.   Claris Pong Mariyah Upshaw PA-C 04/28/2020 10:38 AM

## 2020-04-28 NOTE — Progress Notes (Addendum)
PROGRESS NOTE  [name] EVO:[id number] DOB: 05/04/1970 DOA: 04/27/2020 PCP: [name], MD   LOS: 1 day   Brief narrative: [name] is a 50 y.o. male with past medical history of lupus, chronic pancytopenia, B12 deficiency, hypothyroidism presented to hospital with shortness of breath abdominal distention and swelling of the legs for 3 weeks.  Patient does have history of lupus but is not taking any medication at this time.  Had been seen by rheumatologist 3 to 4 months back but stopped taking Imuran by herself and was not on prednisone.  In the ED, patient was to have severe abdominal distention as well as leg swelling.  CT scan of the chest and abdomen showed evidence of ascites as well as anasarca.  Work-up also showed hypoalbuminemia.  Patient was then admitted hospital for further treatment  Assessment/Plan:  Principal Problem:   Acute heart failure with preserved ejection fraction (HFpEF) (HCC) Active Problems:   Lupus (systemic lupus erythematosus) (HCC)   Dyspnea   Systemic lupus erythematosus with lung involvement (HCC)   Elevated TSH   Stage 3a chronic kidney disease (HCC)   Acute HFpEF Significantly volume overloaded presentation.  Continue Lasix.  2D echocardiogram on 11/2019 showed preserved ejection fraction.  Stress test was normal as well.  Continue intake and output charting, daily weights, continue diuresis.  Fluid restriction.  Currently on IV Lasix and albumin.  We will hold off with further IV Lasix dose.  We will get nephrology opinion. Currently on room air.   Acute kidney injury with hyperkalemia and metabolic acidosis.  Documented chronic kidney disease 3a in the computer.  Significant rise in creatinine recently.  Likely multifactorial from congestion, use of lisinopril Lasix, NSAIDs, possibility of lupus nephritis. Continue urine output monitoring.  CT scan of the abdomen without any hydronephrosis/obstruction.  Urinary sodium of 22.  Serum  creatinine at 4.1.  Recent creatinine on 01/08/20 at at 1.5.  Urine osmolality 318.  Serum osmolality 312.  Urine analysis shows cloudy urine with dipstick +20 1-50 RBCs, more than 300 protein and WBCs.  Hyaline casts present.  Will need nephrology evaluation.  Will send urine for culture and sensitivity.   Urine analysis with source of hematuria proteinuria and pyuria.  Strong suspicion for renal involvement from lupus as well. Foley catheter.  We will continue for now.  Might benefit from steroids if okay with nephrology.   history of lupus pleurisy pericardial effusion.  Will get 2D echocardiogram as well.  Patient would likely benefit from steroids.  Hypoalbuminemia Patient with proteinuria.  Likely secondary to malnutrition/proteinuria.  Bladder pain likely spasms.  Complains of pleuritic chest pain as well.  Add flavoxate, oxycodone    Lab Results  Component Value Date   CREATININE 4.19 (H) 04/28/2020   CREATININE 4.19 (H) 04/27/2020   CREATININE 1.52 (H) 01/08/2020    Ascites. Likely secondary to congestive heart failure and lupus.  Paracentesis has been requested.   History of Lupus. Noncompliant with medication regimen.  Follow nephrology.  Chronic pancytopenia/ leukopenia. Continue to monitor closely.  Was seen by oncology in the past and had bone marrow biopsy.  No further work-up planned.  Monitor for bleeding.   Hypothyroidism TSH of 7.8. Continue Synthroid.   DVT prophylaxis: heparin injection 5,000 Units Start: 04/28/20 0030   Code Status: Full code  Family Communication: None  Status is: Inpatient  Remains inpatient appropriate because:IV treatments appropriate due to intensity of illness or inability to take PO and Inpatient level  of care appropriate due to severity of illness, acute kidney injury, ascites, fluid overload   Dispo: The patient is from: Home              Anticipated d/c is to: Home              Anticipated d/c date is: 2 days or more               Patient currently is not medically stable to d/c.   Consultants: Nephrology  Procedures:  Paracentesis planned  Antibiotics:  . None  Anti-infectives (From admission, onward)   None     Subjective: Today, patient was seen and examined at bedside.  Complains of abdominal discomfort and pain some shortness of breath.  Pain around the penile area.  Denies any nausea or vomiting.  Objective: Vitals:   04/28/20 0310 04/28/20 0609  BP: (!) 150/97 (!) 155/107  Pulse: (!) 106 (!) 103  Resp: 20 19  Temp: 97.8 F (36.6 C) (!) 97.4 F (36.3 C)  SpO2: 100% 100%    Intake/Output Summary (Last 24 hours) at 04/28/2020 0841 Last data filed at 04/28/2020 0653 Gross per 24 hour  Intake 170 ml  Output 200 ml  Net -30 ml   Filed Weights   04/27/20 1954 04/28/20 0614  Weight: 81.6 kg 90.2 kg   Body mass index is 27.74 kg/m.   Physical Exam:  GENERAL: Patient is alert awake and oriented. Not in obvious distress. HENT: No scleral pallor or icterus. Pupils equally reactive to light. Oral mucosa is moist NECK: is supple, no gross swelling noted. CHEST: Decreased breath sounds.  Coarse breath sounds noted. CVS: S1 and S2 heard, no murmur. Regular rate and rhythm.  ABDOMEN: Soft, non-tender, bowel sounds are present.  Tender abdomen with tense ascites present.  Foley catheter in place. EXTREMITIES: Bilateral pedal edema++ penile swelling CNS: Cranial nerves are intact. No focal motor deficits. SKIN: warm and dry , pigmentation over the skin.  Data Review: I have personally reviewed the following laboratory data and studies,  CBC: Recent Labs  Lab 04/27/20 2016 04/28/20 0431  WBC 2.8* 2.6*  NEUTROABS 1.9 2.1  HGB 8.6* 8.3*  HCT 25.9* 25.0*  MCV 90.9 91.2  PLT 188 165   Basic Metabolic Panel: Recent Labs  Lab 04/27/20 2016 04/28/20 0431  NA 133* 135  K 5.3* 5.4*  CL 113* 113*  CO2 11* 14*  GLUCOSE 97 114*  BUN 82* 84*  CREATININE 4.19* 4.19*  CALCIUM 7.3*  7.6*  PHOS  --  8.8*   Liver Function Tests: Recent Labs  Lab 04/27/20 2016 04/28/20 0431  AST 21  --   ALT 19  --   ALKPHOS 66  --   BILITOT 0.3  --   PROT 5.6*  --   ALBUMIN 1.6* 1.9*   No results for input(s): LIPASE, AMYLASE in the last 168 hours. No results for input(s): AMMONIA in the last 168 hours. Cardiac Enzymes: Recent Labs  Lab 04/28/20 0431  CKTOTAL 65   BNP (last 3 results) Recent Labs    04/27/20 2016  BNP 36.5    ProBNP (last 3 results) Recent Labs    11/06/19 1613  PROBNP 337.0*    CBG: No results for input(s): GLUCAP in the last 168 hours. Recent Results (from the past 240 hour(s))  Resp Panel by RT-PCR (Flu A&B, Covid) Nasopharyngeal Swab     Status: None   Collection Time: 04/27/20  8:22 PM  Specimen: Nasopharyngeal Swab; Nasopharyngeal(NP) swabs in vial transport medium  Result Value Ref Range Status   SARS Coronavirus 2 by RT PCR NEGATIVE NEGATIVE Final    Comment: (NOTE) SARS-CoV-2 target nucleic acids are NOT DETECTED.  The SARS-CoV-2 RNA is generally detectable in upper respiratory specimens during the acute phase of infection. The lowest concentration of SARS-CoV-2 viral copies this assay can detect is 138 copies/mL. A negative result does not preclude SARS-Cov-2 infection and should not be used as the sole basis for treatment or other patient management decisions. A negative result may occur with  improper specimen collection/handling, submission of specimen other than nasopharyngeal swab, presence of viral mutation(s) within the areas targeted by this assay, and inadequate number of viral copies(<138 copies/mL). A negative result must be combined with clinical observations, patient history, and epidemiological information. The expected result is Negative.  Fact Sheet for Patients:  EntrepreneurPulse.com.au  Fact Sheet for Healthcare Providers:  IncredibleEmployment.be  This test is no t  yet approved or cleared by the [place] FDA and  has been authorized for detection and/or diagnosis of SARS-CoV-2 by FDA under an Emergency Use Authorization (EUA). This EUA will remain  in effect (meaning this test can be used) for the duration of the COVID-19 declaration under Section 564(b)(1) of the Act, 21 U.S.C.section 360bbb-3(b)(1), unless the authorization is terminated  or revoked sooner.       Influenza A by PCR NEGATIVE NEGATIVE Final   Influenza B by PCR NEGATIVE NEGATIVE Final    Comment: (NOTE) The Xpert Xpress SARS-CoV-2/FLU/RSV plus assay is intended as an aid in the diagnosis of influenza from Nasopharyngeal swab specimens and should not be used as a sole basis for treatment. Nasal washings and aspirates are unacceptable for Xpert Xpress SARS-CoV-2/FLU/RSV testing.  Fact Sheet for Patients: EntrepreneurPulse.com.au  Fact Sheet for Healthcare Providers: IncredibleEmployment.be  This test is not yet approved or cleared by the [place] FDA and has been authorized for detection and/or diagnosis of SARS-CoV-2 by FDA under an Emergency Use Authorization (EUA). This EUA will remain in effect (meaning this test can be used) for the duration of the COVID-19 declaration under Section 564(b)(1) of the Act, 21 U.S.C. section 360bbb-3(b)(1), unless the authorization is terminated or revoked.  Performed at [facility], [place] [address] [place], [place] [address]      Studies: CT ABDOMEN PELVIS WO CONTRAST  Result Date: 04/27/2020 CLINICAL DATA:  Congestive heart failure, tachycardia, dyspnea, abdominal distension, lower extremity edema EXAM: CT CHEST, ABDOMEN AND PELVIS WITHOUT CONTRAST TECHNIQUE: Multidetector CT imaging of the chest, abdomen and pelvis was performed following the standard protocol without IV contrast. COMPARISON:  None. FINDINGS: CT CHEST FINDINGS Cardiovascular: Mild coronary artery  calcification. Global cardiac size within normal limits. Small pericardial effusion. No CT evidence of cardiac tamponade. Central pulmonary arteries are of normal caliber. Thoracic aorta is unremarkable. Mediastinum/Nodes: Visualized thyroid unremarkable. No pathologic thoracic adenopathy. Esophagus unremarkable. Lungs/Pleura: Trace right and small left pleural effusions are present with mild bibasilar atelectasis. Lungs are otherwise clear. No pneumothorax. Central airways are widely patent. Musculoskeletal: No acute bone abnormality. No lytic or blastic bone lesion. CT ABDOMEN PELVIS FINDINGS Hepatobiliary: No focal liver abnormality is seen. No gallstones, gallbladder wall thickening, or biliary dilatation. Pancreas: Unremarkable Spleen: Unremarkable Adrenals/Urinary Tract: Adrenal glands are unremarkable. Kidneys are normal, without renal calculi, focal lesion, or hydronephrosis. Bladder is unremarkable. Stomach/Bowel: Moderate descending and sigmoid colonic diverticulosis. Stomach, small bowel, and large bowel are otherwise unremarkable. Appendix normal. No  free intraperitoneal gas. Large volume ascites is present. Vascular/Lymphatic: No significant vascular findings are present. No enlarged abdominal or pelvic lymph nodes. Reproductive: Prostate is unremarkable. Other: Rectum unremarkable.  No abdominal wall hernia. Musculoskeletal: Degenerative changes are seen at the lumbosacral junction. No lytic or blastic bone lesion is identified. No acute bone abnormality. There is moderate subcutaneous edema noted within the flanks and within the visualized lower extremities bilaterally. IMPRESSION: Large volume ascites and moderate subcutaneous edema involving the flanks and visualized lower extremities bilaterally demonstrating a dependent, lower extremity gradient. While this can be seen in the setting of right heart failure, this is nonspecific. Trace bilateral pleural effusions. Moderate sigmoid diverticulosis.  Electronically Signed   By: Fidela Salisbury MD   On: 04/27/2020 23:14   CT Chest Wo Contrast  Result Date: 04/27/2020 CLINICAL DATA:  Congestive heart failure, tachycardia, dyspnea, abdominal distension, lower extremity edema EXAM: CT CHEST, ABDOMEN AND PELVIS WITHOUT CONTRAST TECHNIQUE: Multidetector CT imaging of the chest, abdomen and pelvis was performed following the standard protocol without IV contrast. COMPARISON:  None. FINDINGS: CT CHEST FINDINGS Cardiovascular: Mild coronary artery calcification. Global cardiac size within normal limits. Small pericardial effusion. No CT evidence of cardiac tamponade. Central pulmonary arteries are of normal caliber. Thoracic aorta is unremarkable. Mediastinum/Nodes: Visualized thyroid unremarkable. No pathologic thoracic adenopathy. Esophagus unremarkable. Lungs/Pleura: Trace right and small left pleural effusions are present with mild bibasilar atelectasis. Lungs are otherwise clear. No pneumothorax. Central airways are widely patent. Musculoskeletal: No acute bone abnormality. No lytic or blastic bone lesion. CT ABDOMEN PELVIS FINDINGS Hepatobiliary: No focal liver abnormality is seen. No gallstones, gallbladder wall thickening, or biliary dilatation. Pancreas: Unremarkable Spleen: Unremarkable Adrenals/Urinary Tract: Adrenal glands are unremarkable. Kidneys are normal, without renal calculi, focal lesion, or hydronephrosis. Bladder is unremarkable. Stomach/Bowel: Moderate descending and sigmoid colonic diverticulosis. Stomach, small bowel, and large bowel are otherwise unremarkable. Appendix normal. No free intraperitoneal gas. Large volume ascites is present. Vascular/Lymphatic: No significant vascular findings are present. No enlarged abdominal or pelvic lymph nodes. Reproductive: Prostate is unremarkable. Other: Rectum unremarkable.  No abdominal wall hernia. Musculoskeletal: Degenerative changes are seen at the lumbosacral junction. No lytic or blastic bone  lesion is identified. No acute bone abnormality. There is moderate subcutaneous edema noted within the flanks and within the visualized lower extremities bilaterally. IMPRESSION: Large volume ascites and moderate subcutaneous edema involving the flanks and visualized lower extremities bilaterally demonstrating a dependent, lower extremity gradient. While this can be seen in the setting of right heart failure, this is nonspecific. Trace bilateral pleural effusions. Moderate sigmoid diverticulosis. Electronically Signed   By: Fidela Salisbury MD   On: 04/27/2020 23:14   DG Chest Portable 1 View  Result Date: 04/27/2020 CLINICAL DATA:  Shortness of breath today.  History of CHF. EXAM: PORTABLE CHEST 1 VIEW COMPARISON:  11/01/2018 FINDINGS: Shallow inspiration with linear atelectasis in the lung bases, greater on the left. Heart size and pulmonary vascularity are probably normal for technique. No pleural effusions. No pneumothorax. Mediastinal contours appear intact. IMPRESSION: Shallow inspiration with linear atelectasis in the lung bases, greater on the left. Electronically Signed   By: Lucienne Capers M.D.   On: 04/27/2020 20:42      Flora Lipps, MD  [facility] 04/28/2020  If 7PM-7AM, please contact night-coverage

## 2020-04-28 NOTE — Progress Notes (Signed)
   04/28/20 0058  Assess: MEWS Score  Temp 97.7 F (36.5 C)  BP (!) 154/95  Pulse Rate 97  Resp 20  SpO2 100 %  O2 Device Room Air  Assess: MEWS Score  MEWS Temp 0  MEWS Systolic 0  MEWS Pulse 0  MEWS RR 0  MEWS LOC 2  MEWS Score 2  MEWS Score Color Yellow  Assess: if the MEWS score is Yellow or Red  Were vital signs taken at a resting state? Yes  Focused Assessment No change from prior assessment  Early Detection of Sepsis Score *See Row Information* Medium  MEWS guidelines implemented *See Row Information* Yes  Treat  MEWS Interventions Other (Comment)  Pain Scale 0-10  Pain Score 0  Take Vital Signs  Increase Vital Sign Frequency  Yellow: Q 2hr X 2 then Q 4hr X 2, if remains yellow, continue Q 4hrs  Escalate  MEWS: Escalate Yellow: discuss with charge nurse/RN and consider discussing with provider and RRT  Notify: Charge Nurse/RN  Name of Charge Nurse/RN Notified Michelle, RN  Date Charge Nurse/RN Notified 04/28/20  Time Charge Nurse/RN Notified [phone]

## 2020-04-28 NOTE — Progress Notes (Signed)
  Echocardiogram 2D Echocardiogram with 3D has been performed.  [name] 04/28/2020, 2:32 PM

## 2020-04-29 ENCOUNTER — Inpatient Hospital Stay (HOSPITAL_COMMUNITY): Payer: BC Managed Care – PPO

## 2020-04-29 ENCOUNTER — Ambulatory Visit: Payer: BC Managed Care – PPO | Admitting: Family Medicine

## 2020-04-29 DIAGNOSIS — N179 Acute kidney failure, unspecified: Secondary | ICD-10-CM | POA: Diagnosis not present

## 2020-04-29 DIAGNOSIS — N1831 Chronic kidney disease, stage 3a: Secondary | ICD-10-CM | POA: Diagnosis not present

## 2020-04-29 DIAGNOSIS — R188 Other ascites: Secondary | ICD-10-CM | POA: Diagnosis not present

## 2020-04-29 DIAGNOSIS — R0600 Dyspnea, unspecified: Secondary | ICD-10-CM

## 2020-04-29 DIAGNOSIS — M328 Other forms of systemic lupus erythematosus: Secondary | ICD-10-CM | POA: Diagnosis not present

## 2020-04-29 LAB — RENAL FUNCTION PANEL
Albumin: 2 g/dL — ABNORMAL LOW (ref 3.5–5.0)
Anion gap: 9 (ref 5–15)
BUN: 85 mg/dL — ABNORMAL HIGH (ref 6–20)
CO2: 12 mmol/L — ABNORMAL LOW (ref 22–32)
Calcium: 7.7 mg/dL — ABNORMAL LOW (ref 8.9–10.3)
Chloride: 112 mmol/L — ABNORMAL HIGH (ref 98–111)
Creatinine, Ser: 4.83 mg/dL — ABNORMAL HIGH (ref 0.61–1.24)
GFR, Estimated: 14 mL/min — ABNORMAL LOW (ref >60)
Glucose, Bld: 90 mg/dL (ref 70–99)
Phosphorus: 9.3 mg/dL — ABNORMAL HIGH (ref 2.5–4.6)
Potassium: 5.4 mmol/L — ABNORMAL HIGH (ref 3.5–5.1)
Sodium: 133 mmol/L — ABNORMAL LOW (ref 135–145)

## 2020-04-29 LAB — HEPATITIS PANEL, ACUTE
HCV Ab: NONREACTIVE
Hep A IgM: NONREACTIVE
Hep B C IgM: NONREACTIVE
Hepatitis B Surface Ag: NONREACTIVE

## 2020-04-29 LAB — SODIUM, URINE, RANDOM: Sodium, Ur: 24 mmol/L

## 2020-04-29 LAB — CREATININE, URINE, RANDOM: Creatinine, Urine: 382.59 mg/dL

## 2020-04-29 LAB — PROTIME-INR
INR: 1.1 (ref 0.8–1.2)
Prothrombin Time: 13.7 seconds (ref 11.4–15.2)

## 2020-04-29 MED ORDER — HEPARIN SODIUM (PORCINE) 5000 UNIT/ML IJ SOLN
5000.0000 [IU] | Freq: Three times a day (TID) | INTRAMUSCULAR | Status: DC
  Administered 2020-04-30 – 2020-05-01 (×2): 5000 [IU] via SUBCUTANEOUS
  Filled 2020-04-29 (×2): qty 1

## 2020-04-29 MED ORDER — SODIUM ZIRCONIUM CYCLOSILICATE 10 G PO PACK
10.0000 g | PACK | Freq: Every day | ORAL | Status: DC
  Administered 2020-04-29 – 2020-05-01 (×3): 10 g via ORAL
  Filled 2020-04-29 (×3): qty 1

## 2020-04-29 MED ORDER — SODIUM CHLORIDE 0.9 % IV SOLN
500.0000 mg | INTRAVENOUS | Status: AC
  Administered 2020-04-29 – 2020-05-01 (×3): 500 mg via INTRAVENOUS
  Filled 2020-04-29 (×3): qty 4

## 2020-04-29 MED ORDER — FUROSEMIDE 10 MG/ML IJ SOLN
160.0000 mg | Freq: Three times a day (TID) | INTRAVENOUS | Status: DC
  Administered 2020-04-29 – 2020-05-01 (×5): 160 mg via INTRAVENOUS
  Filled 2020-04-29 (×3): qty 16
  Filled 2020-04-29 (×2): qty 10
  Filled 2020-04-29 (×3): qty 16
  Filled 2020-04-29: qty 10

## 2020-04-29 MED ORDER — BISACODYL 5 MG PO TBEC
5.0000 mg | DELAYED_RELEASE_TABLET | Freq: Every day | ORAL | Status: DC | PRN
  Administered 2020-04-30 – 2020-05-07 (×6): 5 mg via ORAL
  Filled 2020-04-29 (×6): qty 1

## 2020-04-29 MED ORDER — POLYETHYLENE GLYCOL 3350 17 G PO PACK
17.0000 g | PACK | Freq: Every day | ORAL | Status: DC
  Administered 2020-05-02 – 2020-05-03 (×2): 17 g via ORAL
  Filled 2020-04-29 (×4): qty 1

## 2020-04-29 NOTE — Progress Notes (Signed)
PROGRESS NOTE  [name] NTZ:[id number] DOB: 04-17-1971 DOA: 04/27/2020 PCP: [name], MD   LOS: 2 days   Brief narrative: [name] is a 50 y.o. male with past medical history of lupus, chronic pancytopenia, B12 deficiency, hypothyroidism presented to hospital with shortness of breath abdominal distention and swelling of the legs for 3 weeks.  Patient does have history of lupus but is not taking any medication at this time.  Had been seen by rheumatologist 3 to 4 months back but stopped taking Imuran by herself and was not on prednisone.  In the ED, patient was to have severe abdominal distention as well as leg swelling.  CT scan of the chest and abdomen showed evidence of ascites as well as anasarca.  Work-up also showed hypoalbuminemia.  Patient was then admitted hospital for further treatment  Assessment/Plan:  Principal Problem:   Acute heart failure with preserved ejection fraction (HFpEF) (HCC) Active Problems:   Lupus (systemic lupus erythematosus) (HCC)   Dyspnea   Systemic lupus erythematosus with lung involvement (HCC)   Elevated TSH   Stage 3a chronic kidney disease ([place])   Acute kidney injury with hyperkalemia and metabolic acidosis.  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  Management as per nephrology.  Renal biopsy has been ordered.  Patient has been started on high-dose steroids.  Increase the dose of IV Lasix.  Possibility of lupus nephritis.  Avoid nephrotoxic medication.  CT scan of the abdomen pelvis without hydronephrosis.  On Foley catheter.  Strict intake and output charting.  Lokelma has been added to the regimen daily.  B[place]ick acidosis but unable to use sodium bicarb due to hypervolemia.   Acute HFpEF Significantly volume overloaded presentation.  Continue IV Lasix.  Dose has been increased by nephrology today.  Minimal urinary output.  2D echocardiogram on 11/2019 showed preserved ejection fraction.  Stress test was normal as well.  Continue intake  and output charting, daily weights, continue diuresis.  Fluid restriction.   Currently on room air.    history of lupus, pleurisy pericardial effusion.  Will get 2D echocardiogram as well.  Patient would likely benefit from steroids.  Followed by Dr. Kathlene November in the past.  Hypoalbuminemia Patient with proteinuria.  states that he has eaten poorly recently abdominal discomfort pain and diminished appetite.  Benefit from nutrition evaluation.  Bladder pain likely spasms.  Complains of pleuritic chest pain as well.  Added flavoxate, oxycodone    Lab Results  Component Value Date   CREATININE 4.83 (H) 04/29/2020   CREATININE 4.19 (H) 04/28/2020   CREATININE 4.19 (H) 04/27/2020    Ascites. Likely secondary to congestive heart failure and lupus.  Status post paracentesis with 2 L of fluid removal yesterday.  Peritoneal fluid culture and sensitivity negative.  Gram stain without organisms.  History of Lupus. Noncompliant with medication regimen.  Follow nephrology.  Chronic pancytopenia/ leukopenia. Continue to monitor closely.  Was seen by oncology in the past and had bone marrow biopsy.  No further work-up planned.  Monitor for bleeding.   Hypothyroidism TSH of 7.8. Continue Synthroid.   DVT prophylaxis: heparin injection 5,000 Units Start: 04/28/20 0030   Code Status: Full code  Family Communication: None.  Status is: Inpatient  Remains inpatient appropriate because:IV treatments appropriate due to intensity of illness or inability to take PO and Inpatient level of care appropriate due to severity of illness, acute kidney injury likely RPGN, ascites, fluid overload, need for renal biopsy, IV high-dose diuretics and Solu-Medrol  Dispo: The patient is from: Home              Anticipated d/c is to: Home              Anticipated d/c date is: 3 or more days              Patient currently is not medically stable to d/c.   Consultants: Nephrology  Procedures:  Paracentesis  planned  Antibiotics:  . None  Anti-infectives (From admission, onward)   None     Subjective: Today, patient was seen and examined at bedside.  Patient has minimal urinary output.  States that his abdomen is still very distended despite paracentesis.  Denies chest pain fever or chills.  Shortness of breath has slightly improved  Objective: Vitals:   04/29/20 0414 04/29/20 1324  BP: 121/82 124/81  Pulse: 86 89  Resp: 14 10  Temp: 97.7 F (36.5 C) 97.9 F (36.6 C)  SpO2: 100% 99%    Intake/Output Summary (Last 24 hours) at 04/29/2020 1428 Last data filed at 04/29/2020 0500 Gross per 24 hour  Intake 280.44 ml  Output 200 ml  Net 80.44 ml   Filed Weights   04/27/20 1954 04/28/20 0614 04/29/20 0414  Weight: 81.6 kg 90.2 kg 88.5 kg   Body mass index is 27.22 kg/m.   Physical Exam:  GENERAL: Patient is alert awake and oriented. Not in obvious distress.  Feels ill and frail. HENT: No scleral pallor or icterus. Pupils equally reactive to light. Oral mucosa is moist NECK: is supple, no gross swelling noted. CHEST: Decreased breath sounds.  Coarse breath sounds noted. CVS: S1 and S2 heard, no murmur. Regular rate and rhythm.  ABDOMEN: Soft, non-tender, bowel sounds are present.  Distended abdomen with ascites, Foley catheter in place. EXTREMITIES: Bilateral pedal edema++ penile swelling CNS: Cranial nerves are intact. No focal motor deficits. SKIN: warm and dry , pigmentation over the skin.  Data Review: I have personally reviewed the following laboratory data and studies,  CBC: Recent Labs  Lab 04/27/20 2016 04/28/20 0431  WBC 2.8* 2.6*  NEUTROABS 1.9 2.1  HGB 8.6* 8.3*  HCT 25.9* 25.0*  MCV 90.9 91.2  PLT 188 818   Basic Metabolic Panel: Recent Labs  Lab 04/27/20 2016 04/28/20 0431 04/29/20 0445  NA 133* 135 133*  K 5.3* 5.4* 5.4*  CL 113* 113* 112*  CO2 11* 14* 12*  GLUCOSE 97 114* 90  BUN 82* 84* 85*  CREATININE 4.19* 4.19* 4.83*  CALCIUM 7.3* 7.6*  7.7*  PHOS  --  8.8* 9.3*   Liver Function Tests: Recent Labs  Lab 04/27/20 2016 04/28/20 0431 04/29/20 0445  AST 21  --   --   ALT 19  --   --   ALKPHOS 66  --   --   BILITOT 0.3  --   --   PROT 5.6*  --   --   ALBUMIN 1.6* 1.9* 2.0*   No results for input(s): LIPASE, AMYLASE in the last 168 hours. No results for input(s): AMMONIA in the last 168 hours. Cardiac Enzymes: Recent Labs  Lab 04/28/20 0431  CKTOTAL 65   BNP (last 3 results) Recent Labs    04/27/20 2016  BNP 36.5    ProBNP (last 3 results) Recent Labs    11/06/19 1613  PROBNP 337.0*    CBG: No results for input(s): GLUCAP in the last 168 hours. Recent Results (from the past 240 hour(s))  Resp Panel by  RT-PCR (Flu A&B, Covid) Nasopharyngeal Swab     Status: None   Collection Time: 04/27/20  8:22 PM   Specimen: Nasopharyngeal Swab; Nasopharyngeal(NP) swabs in vial transport medium  Result Value Ref Range Status   SARS Coronavirus 2 by RT PCR NEGATIVE NEGATIVE Final    Comment: (NOTE) SARS-CoV-2 target nucleic acids are NOT DETECTED.  The SARS-CoV-2 RNA is generally detectable in upper respiratory specimens during the acute phase of infection. The lowest concentration of SARS-CoV-2 viral copies this assay can detect is 138 copies/mL. A negative result does not preclude SARS-Cov-2 infection and should not be used as the sole basis for treatment or other patient management decisions. A negative result may occur with  improper specimen collection/handling, submission of specimen other than nasopharyngeal swab, presence of viral mutation(s) within the areas targeted by this assay, and inadequate number of viral copies(<138 copies/mL). A negative result must be combined with clinical observations, patient history, and epidemiological information. The expected result is Negative.  Fact Sheet for Patients:  EntrepreneurPulse.com.au  Fact Sheet for Healthcare Providers:   IncredibleEmployment.be  This test is no t yet approved or cleared by the [place] FDA and  has been authorized for detection and/or diagnosis of SARS-CoV-2 by FDA under an Emergency Use Authorization (EUA). This EUA will remain  in effect (meaning this test can be used) for the duration of the COVID-19 declaration under Section 564(b)(1) of the Act, 21 U.S.C.section 360bbb-3(b)(1), unless the authorization is terminated  or revoked sooner.       Influenza A by PCR NEGATIVE NEGATIVE Final   Influenza B by PCR NEGATIVE NEGATIVE Final    Comment: (NOTE) The Xpert Xpress SARS-CoV-2/FLU/RSV plus assay is intended as an aid in the diagnosis of influenza from Nasopharyngeal swab specimens and should not be used as a sole basis for treatment. Nasal washings and aspirates are unacceptable for Xpert Xpress SARS-CoV-2/FLU/RSV testing.  Fact Sheet for Patients: EntrepreneurPulse.com.au  Fact Sheet for Healthcare Providers: IncredibleEmployment.be  This test is not yet approved or cleared by the [place] FDA and has been authorized for detection and/or diagnosis of SARS-CoV-2 by FDA under an Emergency Use Authorization (EUA). This EUA will remain in effect (meaning this test can be used) for the duration of the COVID-19 declaration under Section 564(b)(1) of the Act, 21 U.S.C. section 360bbb-3(b)(1), unless the authorization is terminated or revoked.  Performed at [facility], [place] [address] [place], [place] [address]   Culture, Urine     Status: Abnormal (Preliminary result)   Collection Time: 04/28/20 11:27 AM   Specimen: Urine, Clean Catch  Result Value Ref Range Status   Specimen Description   Final    URINE, CLEAN CATCH Performed at [facility], [place] [address], [place], [place] [address]    Special Requests   Final    NONE Performed at [facility], [place] [address] [place], [place] [address]    Culture (A)  Final    >=100,000 COLONIES/mL STAPHYLOCOCCUS AUREUS SUSCEPTIBILITIES TO FOLLOW Performed at [facility], [place] [address] [place], [place] [address]    Report Status PENDING  Incomplete  Culture, body fluid-bottle     Status: None (Preliminary result)   Collection Time: 04/28/20 11:30 AM   Specimen: Peritoneal Washings  Result Value Ref Range Status   Specimen Description PERITONEAL FLUID  Final   Special Requests NONE  Final   Culture   Final    NO GROWTH < 24  HOURS Performed at [facility], [place] [address], [place], [place] [address]    Report Status PENDING  Incomplete  Gram stain     Status: None   Collection Time: 04/28/20 11:30 AM   Specimen: Peritoneal Washings  Result Value Ref Range Status   Specimen Description PERITONEAL FLUID  Final   Special Requests NONE  Final   Gram Stain   Final    WBC PRESENT,BOTH PMN AND MONONUCLEAR NO ORGANISMS SEEN CYTOSPIN SMEAR Performed at [facility], [address], [place], [place] [address]    Report Status 04/28/2020 FINAL  Final     Studies: CT ABDOMEN PELVIS WO CONTRAST  Result Date: 04/27/2020 CLINICAL DATA:  Congestive heart failure, tachycardia, dyspnea, abdominal distension, lower extremity edema EXAM: CT CHEST, ABDOMEN AND PELVIS WITHOUT CONTRAST TECHNIQUE: Multidetector CT imaging of the chest, abdomen and pelvis was performed following the standard protocol without IV contrast. COMPARISON:  None. FINDINGS: CT CHEST FINDINGS Cardiovascular: Mild coronary artery calcification. Global cardiac size within normal limits. Small pericardial effusion. No CT evidence of cardiac tamponade. Central pulmonary arteries are of normal caliber. Thoracic aorta is unremarkable. Mediastinum/Nodes: Visualized thyroid unremarkable. No pathologic thoracic adenopathy. Esophagus unremarkable. Lungs/Pleura: Trace right and small left pleural effusions are present with mild  bibasilar atelectasis. Lungs are otherwise clear. No pneumothorax. Central airways are widely patent. Musculoskeletal: No acute bone abnormality. No lytic or blastic bone lesion. CT ABDOMEN PELVIS FINDINGS Hepatobiliary: No focal liver abnormality is seen. No gallstones, gallbladder wall thickening, or biliary dilatation. Pancreas: Unremarkable Spleen: Unremarkable Adrenals/Urinary Tract: Adrenal glands are unremarkable. Kidneys are normal, without renal calculi, focal lesion, or hydronephrosis. Bladder is unremarkable. Stomach/Bowel: Moderate descending and sigmoid colonic diverticulosis. Stomach, small bowel, and large bowel are otherwise unremarkable. Appendix normal. No free intraperitoneal gas. Large volume ascites is present. Vascular/Lymphatic: No significant vascular findings are present. No enlarged abdominal or pelvic lymph nodes. Reproductive: Prostate is unremarkable. Other: Rectum unremarkable.  No abdominal wall hernia. Musculoskeletal: Degenerative changes are seen at the lumbosacral junction. No lytic or blastic bone lesion is identified. No acute bone abnormality. There is moderate subcutaneous edema noted within the flanks and within the visualized lower extremities bilaterally. IMPRESSION: Large volume ascites and moderate subcutaneous edema involving the flanks and visualized lower extremities bilaterally demonstrating a dependent, lower extremity gradient. While this can be seen in the setting of right heart failure, this is nonspecific. Trace bilateral pleural effusions. Moderate sigmoid diverticulosis. Electronically Signed   By: Fidela Salisbury MD   On: 04/27/2020 23:14   CT Chest Wo Contrast  Result Date: 04/27/2020 CLINICAL DATA:  Congestive heart failure, tachycardia, dyspnea, abdominal distension, lower extremity edema EXAM: CT CHEST, ABDOMEN AND PELVIS WITHOUT CONTRAST TECHNIQUE: Multidetector CT imaging of the chest, abdomen and pelvis was performed following the standard protocol  without IV contrast. COMPARISON:  None. FINDINGS: CT CHEST FINDINGS Cardiovascular: Mild coronary artery calcification. Global cardiac size within normal limits. Small pericardial effusion. No CT evidence of cardiac tamponade. Central pulmonary arteries are of normal caliber. Thoracic aorta is unremarkable. Mediastinum/Nodes: Visualized thyroid unremarkable. No pathologic thoracic adenopathy. Esophagus unremarkable. Lungs/Pleura: Trace right and small left pleural effusions are present with mild bibasilar atelectasis. Lungs are otherwise clear. No pneumothorax. Central airways are widely patent. Musculoskeletal: No acute bone abnormality. No lytic or blastic bone lesion. CT ABDOMEN PELVIS FINDINGS Hepatobiliary: No focal liver abnormality is seen. No gallstones, gallbladder wall thickening, or biliary dilatation. Pancreas: Unremarkable Spleen: Unremarkable Adrenals/Urinary Tract: Adrenal glands are unremarkable. Kidneys  are normal, without renal calculi, focal lesion, or hydronephrosis. Bladder is unremarkable. Stomach/Bowel: Moderate descending and sigmoid colonic diverticulosis. Stomach, small bowel, and large bowel are otherwise unremarkable. Appendix normal. No free intraperitoneal gas. Large volume ascites is present. Vascular/Lymphatic: No significant vascular findings are present. No enlarged abdominal or pelvic lymph nodes. Reproductive: Prostate is unremarkable. Other: Rectum unremarkable.  No abdominal wall hernia. Musculoskeletal: Degenerative changes are seen at the lumbosacral junction. No lytic or blastic bone lesion is identified. No acute bone abnormality. There is moderate subcutaneous edema noted within the flanks and within the visualized lower extremities bilaterally. IMPRESSION: Large volume ascites and moderate subcutaneous edema involving the flanks and visualized lower extremities bilaterally demonstrating a dependent, lower extremity gradient. While this can be seen in the setting of right  heart failure, this is nonspecific. Trace bilateral pleural effusions. Moderate sigmoid diverticulosis. Electronically Signed   By: Fidela Salisbury MD   On: 04/27/2020 23:14   US RENAL  Result Date: 04/29/2020 CLINICAL DATA:  Acute kidney injury. EXAM: RENAL / URINARY TRACT ULTRASOUND COMPLETE COMPARISON:  CT scan 04/27/2020 FINDINGS: Right Kidney: Renal measurements: 11.1 x 4.2 x 5.1 = volume: 126 mL. Echogenicity within normal limits. No mass or hydronephrosis visualized. Left Kidney: Renal measurements: 11.6 x 5.7 x 5.9 cm = volume: 202 mL. Echogenicity within normal limits. No mass or hydronephrosis visualized. Bladder: Appears normal for degree of bladder distention. Other: Ascites IMPRESSION: 1. No evidence for hydronephrosis. 2. Large volume ascites Electronically Signed   By: Misty Stanley M.D.   On: 04/29/2020 07:17   US Paracentesis  Result Date: 04/28/2020 INDICATION: Patient with history of SLE, acute HF in fluid overload, abdominal distension, and ascites. Request is made for diagnostic and therapeutic paracentesis up to 2 L. EXAM: ULTRASOUND GUIDED DIAGNOSTIC AND THERAPEUTIC PARACENTESIS MEDICATIONS: 10 mL 1% lidocaine COMPLICATIONS: None immediate. PROCEDURE: Informed written consent was obtained from the patient after a discussion of the risks, benefits and alternatives to treatment. A timeout was performed prior to the initiation of the procedure. Initial ultrasound scanning demonstrates a large amount of ascites within the right lower abdominal quadrant. The right lower abdomen was prepped and draped in the usual sterile fashion. 1% lidocaine was used for local anesthesia. Following this, a 19 gauge, 7-cm, Yueh catheter was introduced. An ultrasound image was saved for documentation purposes. The paracentesis was performed. The catheter was removed and a dressing was applied. The patient tolerated the procedure well without immediate post procedural complication. Patient received  post-procedure intravenous albumin; see nursing notes for details. FINDINGS: A total of approximately 2 L of clear yellow fluid was removed. Samples were sent to the laboratory as requested by the clinical team. IMPRESSION: Successful ultrasound-guided paracentesis yielding 2 L of peritoneal fluid. Read by: Earley Abide, PA-C Electronically Signed   By: Jacqulynn Cadet M.D.   On: 04/28/2020 11:17   DG Chest Portable 1 View  Result Date: 04/27/2020 CLINICAL DATA:  Shortness of breath today.  History of CHF. EXAM: PORTABLE CHEST 1 VIEW COMPARISON:  11/01/2018 FINDINGS: Shallow inspiration with linear atelectasis in the lung bases, greater on the left. Heart size and pulmonary vascularity are probably normal for technique. No pleural effusions. No pneumothorax. Mediastinal contours appear intact. IMPRESSION: Shallow inspiration with linear atelectasis in the lung bases, greater on the left. Electronically Signed   By: Lucienne Capers M.D.   On: 04/27/2020 20:42   ECHOCARDIOGRAM COMPLETE  Result Date: 04/28/2020    ECHOCARDIOGRAM REPORT   Patient Name:  Max E Trentham Date of Exam: 04/28/2020 Medical Rec #:  [id number]       Height:       71.0 in Accession #:    [id number]      Weight:       198.9 lb Date of Birth:  Aug 04, 1970       BSA:          2.104 m Patient Age:    37 years        BP:           155/100 mmHg Patient Gender: M               HR:           84 bpm. Exam Location:  Inpatient Procedure: 2D Echo, 3D Echo, Cardiac Doppler, Color Doppler and Strain Analysis Indications:    Dyspnea R06.00  History:        Patient has prior history of Echocardiogram examinations, most                 recent 12/14/2019. Lupus- not on any medication. Chronic                 pancytopenia, hypothyroid. Shortness of breath, abdominal                 distention, and swelling of the legs for 3 weeks. Acute kidney                 injury with hyperkalemia and metabolic acidosis.  Sonographer:    Darlina Sicilian RDCS Referring  Phys: [id number] [place]  1. Left ventricular ejection fraction, by estimation, is 55 to 60%. The left ventricle has normal function. The left ventricle has no regional wall motion abnormalities. There is moderate left ventricular hypertrophy. Left ventricular diastolic parameters were normal.  2. Right ventricular systolic function is normal. The right ventricular size is normal. Tricuspid regurgitation signal is inadequate for assessing PA pressure.  3. The mitral valve is grossly normal. Trivial mitral valve regurgitation.  4. The aortic valve is tricuspid. There is mild calcification of the aortic valve including nodular calcification of the tip of the noncoronary cusp with restricted motion. Cannot completely exclude small vegetation involving the noncoronary cusp, although could be acoustic shadowing. If there is high concern for endocarditis, a TEE can be considered. Aortic valve regurgitation is trivial.  5. The inferior vena cava is normal in size with <50% respiratory variability, suggesting right atrial pressure of 8 mmHg.  6. Pleural fluid/possible ascites noted. FINDINGS  Left Ventricle: Left ventricular ejection fraction, by estimation, is 55 to 60%. The left ventricle has normal function. The left ventricle has no regional wall motion abnormalities. The left ventricular internal cavity size was normal in size. There is  moderate left ventricular hypertrophy. Left ventricular diastolic parameters were normal. Right Ventricle: The right ventricular size is normal. No increase in right ventricular wall thickness. Right ventricular systolic function is normal. Tricuspid regurgitation signal is inadequate for assessing PA pressure. Left Atrium: Left atrial size was normal in size. Right Atrium: Right atrial size was normal in size. Pericardium: There is no evidence of pericardial effusion. Mitral Valve: The mitral valve is grossly normal. There is mild thickening of the mitral valve  leaflet(s). Trivial mitral valve regurgitation. Tricuspid Valve: The tricuspid valve is grossly normal. Tricuspid valve regurgitation is mild. Aortic Valve: The aortic valve is tricuspid. There is mild calcification of the aortic valve. Aortic valve regurgitation is trivial. Pulmonic  Valve: The pulmonic valve was grossly normal. Pulmonic valve regurgitation is trivial. Aorta: The aortic root is normal in size and structure. Venous: The inferior vena cava is normal in size with less than 50% respiratory variability, suggesting right atrial pressure of 8 mmHg. IAS/Shunts: No atrial level shunt detected by color flow Doppler.  LEFT VENTRICLE PLAX 2D LVIDd:         4.60 cm  Diastology LVIDs:         3.40 cm  LV e' medial:    8.49 cm/s LV PW:         1.30 cm  LV E/e' medial:  6.4 LV IVS:        1.80 cm  LV e' lateral:   10.40 cm/s LVOT diam:     2.60 cm  LV E/e' lateral: 5.2 LV SV:         121 LV SV Index:   57 LVOT Area:     5.31 cm                          3D Volume EF:                         3D EF:        58 %                         LV EDV:       169 ml                         LV ESV:       70 ml                         LV SV:        98 ml RIGHT VENTRICLE RV S prime:     15.80 cm/s TAPSE (M-mode): 2.2 cm LEFT ATRIUM             Index       RIGHT ATRIUM          Index LA diam:        3.50 cm 1.66 cm/m  RA Area:     9.47 cm LA Vol (A2C):   42.6 ml 20.25 ml/m RA Volume:   12.90 ml 6.13 ml/m LA Vol (A4C):   35.7 ml 16.97 ml/m LA Biplane Vol: 42.3 ml 20.11 ml/m  AORTIC VALVE LVOT Vmax:   112.00 cm/s LVOT Vmean:  77.300 cm/s LVOT VTI:    0.227 m  AORTA Ao Root diam: 3.80 cm Ao Asc diam:  3.50 cm MITRAL VALVE MV Area (PHT): 2.03 cm    SHUNTS MV Decel Time: 373 msec    Systemic VTI:  0.23 m MV E velocity: 54.40 cm/s  Systemic Diam: 2.60 cm MV A velocity: 81.80 cm/s MV E/A ratio:  0.67 Rozann Lesches MD Electronically signed by Rozann Lesches MD Signature Date/Time: 04/28/2020/2:47:01 PM    Final       Flora Lipps, MD  [facility] 04/29/2020  If 7PM-7AM, please contact night-coverage

## 2020-04-29 NOTE — Plan of Care (Signed)

## 2020-04-29 NOTE — Evaluation (Signed)
Physical Therapy Evaluation Patient Details Name: [name] MRN: [id number] DOB: 01-13-1971 Today's Date: 04/29/2020   History of Present Illness  49 y.o. male with past medical history of lupus, chronic pancytopenia, B12 deficiency, hypothyroidism presented to hospital with shortness of breath abdominal distention and swelling of the legs for 3 weeks.  Patient does have history of lupus but is not taking any medication at this time.  Had been seen by rheumatologist 3 to 4 months back but stopped taking Imuran by herself and was not on prednisone.  In the ED, patient was to have severe abdominal distention as well as leg swelling.  CT scan of the chest and abdomen showed evidence of ascites as well as anasarca.  Work-up also showed hypoalbuminemia.  Clinical Impression  Pt admitted with above diagnosis.  Pt currently with functional limitations due to the deficits listed below (see PT Problem List). Pt will benefit from skilled PT to increase their independence and safety with mobility to allow discharge to the venue listed below.   Pt reports mobilizing to/from bathroom and able to ambulate around unit.  Pt reports LE edema is preventing him from mobilizing has he is trying to keep his LEs elevated in bed.  Pt would benefit from ambulating with nursing.  Will check back on pt during acute stay however no f/u PT needs upon d/c at this time.     Follow Up Recommendations No PT follow up    Equipment Recommendations  None recommended by PT    Recommendations for Other Services       Precautions / Restrictions Precautions Precautions: None      Mobility  Bed Mobility Overal bed mobility: Modified Independent                  Transfers Overall transfer level: Modified independent                  Ambulation/Gait Ambulation/Gait assistance: Supervision Gait Distance (Feet): 400 Feet Assistive device: None Gait Pattern/deviations: Step-through pattern;Wide base of  support;Decreased stride length     General Gait Details: slow but steady gait, no unsteadiness or LOB, pt reports mild SOB upon return to room  Stairs            Wheelchair Mobility    Modified Rankin (Stroke Patients Only)       Balance Overall balance assessment: No apparent balance deficits (not formally assessed)                                           Pertinent Vitals/Pain Pain Assessment: No/denies pain    Home Living Family/patient expects to be discharged to:: Private residence Living Arrangements: Alone   Type of Home: House       Home Layout: Two level Home Equipment: None      Prior Function Level of Independence: Independent               Hand Dominance        Extremity/Trunk Assessment        Lower Extremity Assessment Lower Extremity Assessment: Generalized weakness (edematous lower legs)    Cervical / Trunk Assessment Cervical / Trunk Assessment: Normal  Communication   Communication: No difficulties  Cognition Arousal/Alertness: Awake/alert Behavior During Therapy: WFL for tasks assessed/performed Overall Cognitive Status: Within Functional Limits for tasks assessed  General Comments      Exercises     Assessment/Plan    PT Assessment Patient needs continued PT services  PT Problem List Decreased mobility;Decreased activity tolerance;Cardiopulmonary status limiting activity       PT Treatment Interventions DME instruction;Gait training;Balance training;Therapeutic exercise;Stair training;Functional mobility training;Therapeutic activities;Patient/family education    PT Goals (Current goals can be found in the Care Plan section)  Acute Rehab PT Goals PT Goal Formulation: With patient Time For Goal Achievement: 05/13/20 Potential to Achieve Goals: Good    Frequency Min 3X/week   Barriers to discharge        Co-evaluation                AM-PAC PT "6 Clicks" Mobility  Outcome Measure Help needed turning from your back to your side while in a flat bed without using bedrails?: None Help needed moving from lying on your back to sitting on the side of a flat bed without using bedrails?: None Help needed moving to and from a bed to a chair (including a wheelchair)?: None Help needed standing up from a chair using your arms (e.g., wheelchair or bedside chair)?: None Help needed to walk in hospital room?: A Little Help needed climbing 3-5 steps with a railing? : A Little 6 Click Score: 22    End of Session   Activity Tolerance: Patient tolerated treatment well Patient left: in bed;with call bell/phone within reach   PT Visit Diagnosis: Difficulty in walking, not elsewhere classified (R26.2)    Time: 8208-1388 PT Time Calculation (min) (ACUTE ONLY): 13 min   Charges:   PT Evaluation $PT Eval Low Complexity: 1 Low        Kati PT, DPT Acute Rehabilitation Services Pager: [phone] Office: [phone]  York Ram E 04/29/2020, 12:50 PM

## 2020-04-29 NOTE — Progress Notes (Signed)
Admit: 04/27/2020 LOS: 2  61M hx/o SLE off IS presenting with AKI, hypervolemia/ascites, hematuria, proteinuria  Subjective:  . No interval events . No sig UOP despite lasix 80 BID . HIV, HBV, HCV neg, other serologies pending   01/02 0701 - 01/03 0700 In: 926.1 [P.O.:835; IV Piggyback:91.1] Out: 200 [Urine:200]  Filed Weights   04/27/20 1954 04/28/20 0614 04/29/20 0414  Weight: 81.6 kg 90.2 kg 88.5 kg    Scheduled Meds: . Chlorhexidine Gluconate Cloth  6 each Topical Daily  . folic acid  1 mg Oral Daily  . heparin  5,000 Units Subcutaneous Q8H  . levothyroxine  50 mcg Oral Q0600  . sodium chloride flush  3 mL Intravenous Q12H  . vitamin B-12  1,000 mcg Oral Daily   Continuous Infusions: . sodium chloride    . furosemide    . methylPREDNISolone (SOLU-MEDROL) injection     PRN Meds:.sodium chloride, acetaminophen, flavoxATE, ondansetron (ZOFRAN) IV, oxyCODONE-acetaminophen, sodium chloride flush  Current Labs: reviewed  Results for [name], [name] (MRN [id number]) as of 04/29/2020 13:40 Hepatitis B Surface Ag Latest Ref Range: NON REACTIVE  NON REACTIVE    Ref. Range 04/28/2020 19:21  HCV Ab Latest Ref Range: NON REACTIVE  NON REACTIVE   Results for [name], [name] (MRN [id number]) as of 04/29/2020 13:40 IV Screen 4th Generation wRfx Latest Ref Range: Non Reactive  Non Reactive   Physical Exam:  Blood pressure 124/81, pulse 89, temperature 97.9 F (36.6 C), temperature source Oral, resp. rate 10, height 5\' 11"  (1.803 m), weight 88.5 kg, SpO2 99 %. Mildly unwell appearing RRR no rub Bibasilar crackles and diminished in bases 3 to 4+ LEE Distended, soft NCAT EOMI  A 1. AKI/RPGN with hematuria/proteinuria and hx/o #2 2. Hx/o SLE, followed by [facility], prev on imuran off sev mo, hx/o pleuritis/pericarditis 3. Ascites/Hypervolemia likely related #1 and #4; TTE w/ nL LVEF and R sided function 4. Hypoalbuminemia 5. Pancytopenia 6. Hyperkalemia, mild 7. Metabolic Acidosis,  NAG related to #1 8. Hyperphosphatemia  P . 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 Start solumedrol 500 IV x3d . Renal Biopsy requested, nephropathology form in paper chart . Inc lasix to 160 TID IV . No NaHCO3 given hypervolemia . Daily lokelma 10gm . Might req HD during this admission, will re-eval tomorrow . Daily weights, Daily Renal Panel, Strict I/Os, Avoid nephrotoxins (NSAIDs, judicious IV Contrast)   Pearson Grippe MD 04/29/2020, 1:39 PM  Recent Labs  Lab 04/27/20 2016 04/28/20 0431 04/29/20 0445  NA 133* 135 133*  K 5.3* 5.4* 5.4*  CL 113* 113* 112*  CO2 11* 14* 12*  GLUCOSE 97 114* 90  BUN 82* 84* 85*  CREATININE 4.19* 4.19* 4.83*  CALCIUM 7.3* 7.6* 7.7*  PHOS  --  8.8* 9.3*   Recent Labs  Lab 04/27/20 2016 04/28/20 0431  WBC 2.8* 2.6*  NEUTROABS 1.9 2.1  HGB 8.6* 8.3*  HCT 25.9* 25.0*  MCV 90.9 91.2  PLT 188 166

## 2020-04-29 NOTE — Progress Notes (Signed)
Referring Physician(s): Sanford,R  Supervising Physician: Ruthann Cancer  Patient Status:  [facility] - In-pt  Chief Complaint: abdominal distension/lower extremity edema/renal failure   Subjective: Patient familiar to IR service from thoracentesis in 2016, 2017 as well as paracentesis on 04/29/2019.  He has a history of lupus and now presents with acute kidney injury, hypervolemia/ascites, RPGN,  microhematuria, proteinuria, and lower extremity edema.  Request now received for image guided random core renal biopsy for further evaluation.  Currently denies fever, headache, chest pain, back pain.  He does have some dyspnea with exertion, occasional cough, abdominal distention, recent nausea and vomiting.   Past Medical History:  Diagnosis Date  . Lupus ([place])    History reviewed. No pertinent surgical history.  Allergies: Patient has no known allergies.  Medications: Prior to Admission medications   Medication Sig Start Date End Date Taking? Authorizing Provider  folic acid (FOLVITE) 1 MG tablet Take 1 tablet by mouth once daily 01/16/20  Yes [name], MD  furosemide (LASIX) 40 MG tablet TAKE 1 TABLET BY MOUTH IN THE MORNING AND  1/2  (ONE-HALF)  TABLET  BY  MOUTH  IN  THE  LATE  AFTERNOON 04/09/20  Yes [name], MD  ibuprofen (ADVIL,MOTRIN) 200 MG tablet Take 200 mg by mouth every 6 (six) hours as needed (pain).   Yes [provider]  levothyroxine (SYNTHROID) 50 MCG tablet TAKE 1 TABLET BY MOUTH ONCE DAILY BEFORE BREAKFAST 01/16/20  Yes [name], MD  lisinopril (ZESTRIL) 20 MG tablet Take 1 tablet (20 mg total) by mouth daily. 11/27/19  Yes [name], MD  potassium chloride SA (KLOR-CON) 20 MEQ tablet Take 1 tablet (20 mEq total) by mouth daily. 11/06/19  Yes [name], MD  vitamin B-12 (CYANOCOBALAMIN) 1000 MCG tablet Take 1,000 mcg by mouth daily. 04/09/20  Yes [provider]  Vitamin D, Ergocalciferol,  (DRISDOL) 1.25 MG (50000 UNIT) CAPS capsule Take 1 capsule (50,000 Units total) by mouth every 7 (seven) days. 11/12/19  Yes [name], MD  Cyanocobalamin (B-12) 1000 MCG CAPS Take one daily Patient not taking: Reported on 04/27/2020 11/27/19   [name], MD     Vital Signs: BP 124/81 (BP Location: Right Arm)   Pulse 89   Temp 97.9 F (36.6 C) (Oral)   Resp 10   Ht 5\' 11"  (1.803 m)   Wt 195 lb 3.2 oz (88.5 kg)   SpO2 99%   BMI 27.22 kg/m   Physical Exam awake, alert.  Chest with slightly diminished breath sounds bases.  Heart with regular rate and rhythm.  Abdomen distended, few bowel sounds, nontender.  Bilateral lower extremity edema noted.  Imaging: CT ABDOMEN PELVIS WO CONTRAST  Result Date: 04/27/2020 CLINICAL DATA:  Congestive heart failure, tachycardia, dyspnea, abdominal distension, lower extremity edema EXAM: CT CHEST, ABDOMEN AND PELVIS WITHOUT CONTRAST TECHNIQUE: Multidetector CT imaging of the chest, abdomen and pelvis was performed following the standard protocol without IV contrast. COMPARISON:  None. FINDINGS: CT CHEST FINDINGS Cardiovascular: Mild coronary artery calcification. Global cardiac size within normal limits. Small pericardial effusion. No CT evidence of cardiac tamponade. Central pulmonary arteries are of normal caliber. Thoracic aorta is unremarkable. Mediastinum/Nodes: Visualized thyroid unremarkable. No pathologic thoracic adenopathy. Esophagus unremarkable. Lungs/Pleura: Trace right and small left pleural effusions are present with mild bibasilar atelectasis. Lungs are otherwise clear. No pneumothorax. Central airways are widely patent. Musculoskeletal: No acute bone abnormality. No lytic or blastic bone lesion. CT ABDOMEN  PELVIS FINDINGS Hepatobiliary: No focal liver abnormality is seen. No gallstones, gallbladder wall thickening, or biliary dilatation. Pancreas: Unremarkable Spleen: Unremarkable Adrenals/Urinary Tract: Adrenal glands are  unremarkable. Kidneys are normal, without renal calculi, focal lesion, or hydronephrosis. Bladder is unremarkable. Stomach/Bowel: Moderate descending and sigmoid colonic diverticulosis. Stomach, small bowel, and large bowel are otherwise unremarkable. Appendix normal. No free intraperitoneal gas. Large volume ascites is present. Vascular/Lymphatic: No significant vascular findings are present. No enlarged abdominal or pelvic lymph nodes. Reproductive: Prostate is unremarkable. Other: Rectum unremarkable.  No abdominal wall hernia. Musculoskeletal: Degenerative changes are seen at the lumbosacral junction. No lytic or blastic bone lesion is identified. No acute bone abnormality. There is moderate subcutaneous edema noted within the flanks and within the visualized lower extremities bilaterally. IMPRESSION: Large volume ascites and moderate subcutaneous edema involving the flanks and visualized lower extremities bilaterally demonstrating a dependent, lower extremity gradient. While this can be seen in the setting of right heart failure, this is nonspecific. Trace bilateral pleural effusions. Moderate sigmoid diverticulosis. Electronically Signed   By: Fidela Salisbury MD   On: 04/27/2020 23:14   CT Chest Wo Contrast  Result Date: 04/27/2020 CLINICAL DATA:  Congestive heart failure, tachycardia, dyspnea, abdominal distension, lower extremity edema EXAM: CT CHEST, ABDOMEN AND PELVIS WITHOUT CONTRAST TECHNIQUE: Multidetector CT imaging of the chest, abdomen and pelvis was performed following the standard protocol without IV contrast. COMPARISON:  None. FINDINGS: CT CHEST FINDINGS Cardiovascular: Mild coronary artery calcification. Global cardiac size within normal limits. Small pericardial effusion. No CT evidence of cardiac tamponade. Central pulmonary arteries are of normal caliber. Thoracic aorta is unremarkable. Mediastinum/Nodes: Visualized thyroid unremarkable. No pathologic thoracic adenopathy. Esophagus  unremarkable. Lungs/Pleura: Trace right and small left pleural effusions are present with mild bibasilar atelectasis. Lungs are otherwise clear. No pneumothorax. Central airways are widely patent. Musculoskeletal: No acute bone abnormality. No lytic or blastic bone lesion. CT ABDOMEN PELVIS FINDINGS Hepatobiliary: No focal liver abnormality is seen. No gallstones, gallbladder wall thickening, or biliary dilatation. Pancreas: Unremarkable Spleen: Unremarkable Adrenals/Urinary Tract: Adrenal glands are unremarkable. Kidneys are normal, without renal calculi, focal lesion, or hydronephrosis. Bladder is unremarkable. Stomach/Bowel: Moderate descending and sigmoid colonic diverticulosis. Stomach, small bowel, and large bowel are otherwise unremarkable. Appendix normal. No free intraperitoneal gas. Large volume ascites is present. Vascular/Lymphatic: No significant vascular findings are present. No enlarged abdominal or pelvic lymph nodes. Reproductive: Prostate is unremarkable. Other: Rectum unremarkable.  No abdominal wall hernia. Musculoskeletal: Degenerative changes are seen at the lumbosacral junction. No lytic or blastic bone lesion is identified. No acute bone abnormality. There is moderate subcutaneous edema noted within the flanks and within the visualized lower extremities bilaterally. IMPRESSION: Large volume ascites and moderate subcutaneous edema involving the flanks and visualized lower extremities bilaterally demonstrating a dependent, lower extremity gradient. While this can be seen in the setting of right heart failure, this is nonspecific. Trace bilateral pleural effusions. Moderate sigmoid diverticulosis. Electronically Signed   By: Fidela Salisbury MD   On: 04/27/2020 23:14   US RENAL  Result Date: 04/29/2020 CLINICAL DATA:  Acute kidney injury. EXAM: RENAL / URINARY TRACT ULTRASOUND COMPLETE COMPARISON:  CT scan 04/27/2020 FINDINGS: Right Kidney: Renal measurements: 11.1 x 4.2 x 5.1 = volume: 126 mL.  Echogenicity within normal limits. No mass or hydronephrosis visualized. Left Kidney: Renal measurements: 11.6 x 5.7 x 5.9 cm = volume: 202 mL. Echogenicity within normal limits. No mass or hydronephrosis visualized. Bladder: Appears normal for degree of bladder distention. Other: Ascites IMPRESSION: 1. No  evidence for hydronephrosis. 2. Large volume ascites Electronically Signed   By: Misty Stanley M.D.   On: 04/29/2020 07:17   US Paracentesis  Result Date: 04/28/2020 INDICATION: Patient with history of SLE, acute HF in fluid overload, abdominal distension, and ascites. Request is made for diagnostic and therapeutic paracentesis up to 2 L. EXAM: ULTRASOUND GUIDED DIAGNOSTIC AND THERAPEUTIC PARACENTESIS MEDICATIONS: 10 mL 1% lidocaine COMPLICATIONS: None immediate. PROCEDURE: Informed written consent was obtained from the patient after a discussion of the risks, benefits and alternatives to treatment. A timeout was performed prior to the initiation of the procedure. Initial ultrasound scanning demonstrates a large amount of ascites within the right lower abdominal quadrant. The right lower abdomen was prepped and draped in the usual sterile fashion. 1% lidocaine was used for local anesthesia. Following this, a 19 gauge, 7-cm, Yueh catheter was introduced. An ultrasound image was saved for documentation purposes. The paracentesis was performed. The catheter was removed and a dressing was applied. The patient tolerated the procedure well without immediate post procedural complication. Patient received post-procedure intravenous albumin; see nursing notes for details. FINDINGS: A total of approximately 2 L of clear yellow fluid was removed. Samples were sent to the laboratory as requested by the clinical team. IMPRESSION: Successful ultrasound-guided paracentesis yielding 2 L of peritoneal fluid. Read by: Earley Abide, PA-C Electronically Signed   By: Jacqulynn Cadet M.D.   On: 04/28/2020 11:17   DG Chest  Portable 1 View  Result Date: 04/27/2020 CLINICAL DATA:  Shortness of breath today.  History of CHF. EXAM: PORTABLE CHEST 1 VIEW COMPARISON:  11/01/2018 FINDINGS: Shallow inspiration with linear atelectasis in the lung bases, greater on the left. Heart size and pulmonary vascularity are probably normal for technique. No pleural effusions. No pneumothorax. Mediastinal contours appear intact. IMPRESSION: Shallow inspiration with linear atelectasis in the lung bases, greater on the left. Electronically Signed   By: Lucienne Capers M.D.   On: 04/27/2020 20:42   ECHOCARDIOGRAM COMPLETE  Result Date: 04/28/2020    ECHOCARDIOGRAM REPORT   Patient Name:   Angel VIDRIO Date of Exam: 04/28/2020 Medical Rec #:  [id number]       Height:       71.0 in Accession #:    [id number]      Weight:       198.9 lb Date of Birth:  1970/11/04       BSA:          2.104 m Patient Age:    52 years        BP:           155/100 mmHg Patient Gender: M               HR:           84 bpm. Exam Location:  Inpatient Procedure: 2D Echo, 3D Echo, Cardiac Doppler, Color Doppler and Strain Analysis Indications:    Dyspnea R06.00  History:        Patient has prior history of Echocardiogram examinations, most                 recent 12/14/2019. Lupus- not on any medication. Chronic                 pancytopenia, hypothyroid. Shortness of breath, abdominal                 distention, and swelling of the legs for 3 weeks. Acute kidney  injury with hyperkalemia and metabolic acidosis.  Sonographer:    Darlina Sicilian RDCS Referring Phys: [id number] [place]  1. Left ventricular ejection fraction, by estimation, is 55 to 60%. The left ventricle has normal function. The left ventricle has no regional wall motion abnormalities. There is moderate left ventricular hypertrophy. Left ventricular diastolic parameters were normal.  2. Right ventricular systolic function is normal. The right ventricular size is normal. Tricuspid  regurgitation signal is inadequate for assessing PA pressure.  3. The mitral valve is grossly normal. Trivial mitral valve regurgitation.  4. The aortic valve is tricuspid. There is mild calcification of the aortic valve including nodular calcification of the tip of the noncoronary cusp with restricted motion. Cannot completely exclude small vegetation involving the noncoronary cusp, although could be acoustic shadowing. If there is high concern for endocarditis, a TEE can be considered. Aortic valve regurgitation is trivial.  5. The inferior vena cava is normal in size with <50% respiratory variability, suggesting right atrial pressure of 8 mmHg.  6. Pleural fluid/possible ascites noted. FINDINGS  Left Ventricle: Left ventricular ejection fraction, by estimation, is 55 to 60%. The left ventricle has normal function. The left ventricle has no regional wall motion abnormalities. The left ventricular internal cavity size was normal in size. There is  moderate left ventricular hypertrophy. Left ventricular diastolic parameters were normal. Right Ventricle: The right ventricular size is normal. No increase in right ventricular wall thickness. Right ventricular systolic function is normal. Tricuspid regurgitation signal is inadequate for assessing PA pressure. Left Atrium: Left atrial size was normal in size. Right Atrium: Right atrial size was normal in size. Pericardium: There is no evidence of pericardial effusion. Mitral Valve: The mitral valve is grossly normal. There is mild thickening of the mitral valve leaflet(s). Trivial mitral valve regurgitation. Tricuspid Valve: The tricuspid valve is grossly normal. Tricuspid valve regurgitation is mild. Aortic Valve: The aortic valve is tricuspid. There is mild calcification of the aortic valve. Aortic valve regurgitation is trivial. Pulmonic Valve: The pulmonic valve was grossly normal. Pulmonic valve regurgitation is trivial. Aorta: The aortic root is normal in size and  structure. Venous: The inferior vena cava is normal in size with less than 50% respiratory variability, suggesting right atrial pressure of 8 mmHg. IAS/Shunts: No atrial level shunt detected by color flow Doppler.  LEFT VENTRICLE PLAX 2D LVIDd:         4.60 cm  Diastology LVIDs:         3.40 cm  LV e' medial:    8.49 cm/s LV PW:         1.30 cm  LV E/e' medial:  6.4 LV IVS:        1.80 cm  LV e' lateral:   10.40 cm/s LVOT diam:     2.60 cm  LV E/e' lateral: 5.2 LV SV:         121 LV SV Index:   57 LVOT Area:     5.31 cm                          3D Volume EF:                         3D EF:        58 %                         LV  EDV:       169 ml                         LV ESV:       70 ml                         LV SV:        98 ml RIGHT VENTRICLE RV S prime:     15.80 cm/s TAPSE (M-mode): 2.2 cm LEFT ATRIUM             Index       RIGHT ATRIUM          Index LA diam:        3.50 cm 1.66 cm/m  RA Area:     9.47 cm LA Vol (A2C):   42.6 ml 20.25 ml/m RA Volume:   12.90 ml 6.13 ml/m LA Vol (A4C):   35.7 ml 16.97 ml/m LA Biplane Vol: 42.3 ml 20.11 ml/m  AORTIC VALVE LVOT Vmax:   112.00 cm/s LVOT Vmean:  77.300 cm/s LVOT VTI:    0.227 m  AORTA Ao Root diam: 3.80 cm Ao Asc diam:  3.50 cm MITRAL VALVE MV Area (PHT): 2.03 cm    SHUNTS MV Decel Time: 373 msec    Systemic VTI:  0.23 m MV E velocity: 54.40 cm/s  Systemic Diam: 2.60 cm MV A velocity: 81.80 cm/s MV E/A ratio:  0.67 Rozann Lesches MD Electronically signed by Rozann Lesches MD Signature Date/Time: 04/28/2020/2:47:01 PM    Final     Labs:  CBC: Recent Labs    11/06/19 1613 01/08/20 1545 04/27/20 2016 04/28/20 0431  WBC 2.3 Repeated and verified X2.* 1.3 Repeated and verified X2.* 2.8* 2.6*  HGB 12.7* 10.4* 8.6* 8.3*  HCT 38.2* 30.2* 25.9* 25.0*  PLT 165.0 122.0* 188 166    COAGS: No results for input(s): INR, APTT in the last 8760 hours.  BMP: Recent Labs    01/08/20 1545 04/27/20 2016 04/28/20 0431 04/29/20 0445  NA 133* 133* 135  133*  K 4.2 5.3* 5.4* 5.4*  CL 102 113* 113* 112*  CO2 27 11* 14* 12*  GLUCOSE 101* 97 114* 90  BUN 23 82* 84* 85*  CALCIUM 7.2* 7.3* 7.6* 7.7*  CREATININE 1.52* 4.19* 4.19* 4.83*  GFRNONAA  --  16* 16* 14*    LIVER FUNCTION TESTS: Recent Labs    11/06/19 1613 01/08/20 1545 04/27/20 2016 04/28/20 0431 04/29/20 0445  BILITOT 0.2 0.2 0.3  --   --   AST 56* 27 21  --   --   ALT 27 15 19   --   --   ALKPHOS 104 81 66  --   --   PROT 5.5* 5.9* 5.6*  --   --   ALBUMIN 1.7* 2.0* 1.6* 1.9* 2.0*    Assessment and Plan: Patient familiar to IR service from thoracentesis in 2016, 2017 as well as paracentesis on 04/29/2019.  He has a history of lupus and now presents with acute kidney injury, hypervolemia/ascites, RPGN,  microhematuria, proteinuria, and lower extremity edema.  Request now received for image guided random core renal biopsy for further evaluation.  Prior imaging studies were reviewed by Dr. Pascal Lux.  Recommend paracentesis right before renal biopsy to minimize associated risks of bleeding/ infection.  Details/risks of procedure, including but not limited to as listed above, discussed with patient with his understanding and consent.  Procedure scheduled for 1/4.  Depending upon follow-up lab studies patient may require HD catheter placement soon as well per nephrology.    Electronically Signed: D. Rowe Robert, PA-C 04/29/2020, 2:26 PM   I spent a total of 30 minutes at the the patient's bedside AND on the patient's hospital floor or unit, greater than 50% of which was counseling/coordinating care for paracentesis, image guided random core renal biopsy    Patient ID: [name], male   DOB: 11-29-1970, 50 y.o.   MRN: [id number]

## 2020-04-30 ENCOUNTER — Inpatient Hospital Stay (HOSPITAL_COMMUNITY): Payer: BC Managed Care – PPO

## 2020-04-30 DIAGNOSIS — N179 Acute kidney failure, unspecified: Secondary | ICD-10-CM | POA: Diagnosis not present

## 2020-04-30 DIAGNOSIS — N1831 Chronic kidney disease, stage 3a: Secondary | ICD-10-CM | POA: Diagnosis not present

## 2020-04-30 DIAGNOSIS — M328 Other forms of systemic lupus erythematosus: Secondary | ICD-10-CM | POA: Diagnosis not present

## 2020-04-30 DIAGNOSIS — R188 Other ascites: Secondary | ICD-10-CM | POA: Diagnosis not present

## 2020-04-30 HISTORY — PX: IR FLUORO GUIDE CV LINE RIGHT: IMG2283

## 2020-04-30 HISTORY — PX: IR US GUIDE VASC ACCESS RIGHT: IMG2390

## 2020-04-30 LAB — CBC WITH DIFFERENTIAL/PLATELET
Abs Immature Granulocytes: 0.02 10*3/uL (ref 0.00–0.07)
Basophils Absolute: 0 10*3/uL (ref 0.0–0.1)
Basophils Relative: 0 %
Eosinophils Absolute: 0 10*3/uL (ref 0.0–0.5)
Eosinophils Relative: 0 %
HCT: 24.2 % — ABNORMAL LOW (ref 39.0–52.0)
Hemoglobin: 8 g/dL — ABNORMAL LOW (ref 13.0–17.0)
Immature Granulocytes: 1 %
Lymphocytes Relative: 7 %
Lymphs Abs: 0.2 10*3/uL — ABNORMAL LOW (ref 0.7–4.0)
MCH: 29.7 pg (ref 26.0–34.0)
MCHC: 33.1 g/dL (ref 30.0–36.0)
MCV: 90 fL (ref 80.0–100.0)
Monocytes Absolute: 0 10*3/uL — ABNORMAL LOW (ref 0.1–1.0)
Monocytes Relative: 1 %
Neutro Abs: 1.9 10*3/uL (ref 1.7–7.7)
Neutrophils Relative %: 91 %
Platelets: 174 10*3/uL (ref 150–400)
RBC: 2.69 MIL/uL — ABNORMAL LOW (ref 4.22–5.81)
RDW: 12.7 % (ref 11.5–15.5)
WBC: 2 10*3/uL — ABNORMAL LOW (ref 4.0–10.5)
nRBC: 0 % (ref 0.0–0.2)

## 2020-04-30 LAB — COMPREHENSIVE METABOLIC PANEL
ALT: 17 U/L (ref 0–44)
AST: 15 U/L (ref 15–41)
Albumin: 1.8 g/dL — ABNORMAL LOW (ref 3.5–5.0)
Alkaline Phosphatase: 52 U/L (ref 38–126)
Anion gap: 11 (ref 5–15)
BUN: 90 mg/dL — ABNORMAL HIGH (ref 6–20)
CO2: 12 mmol/L — ABNORMAL LOW (ref 22–32)
Calcium: 7.8 mg/dL — ABNORMAL LOW (ref 8.9–10.3)
Chloride: 108 mmol/L (ref 98–111)
Creatinine, Ser: 5.54 mg/dL — ABNORMAL HIGH (ref 0.61–1.24)
GFR, Estimated: 12 mL/min — ABNORMAL LOW (ref >60)
Glucose, Bld: 146 mg/dL — ABNORMAL HIGH (ref 70–99)
Potassium: 6.3 mmol/L (ref 3.5–5.1)
Sodium: 131 mmol/L — ABNORMAL LOW (ref 135–145)
Total Bilirubin: 0.4 mg/dL (ref 0.3–1.2)
Total Protein: 5.5 g/dL — ABNORMAL LOW (ref 6.5–8.1)

## 2020-04-30 LAB — FANA STAINING PATTERNS: Homogeneous Pattern: 1 — ABNORMAL HIGH

## 2020-04-30 LAB — PHOSPHORUS: Phosphorus: 9.1 mg/dL — ABNORMAL HIGH (ref 2.5–4.6)

## 2020-04-30 LAB — MAGNESIUM: Magnesium: 2.4 mg/dL (ref 1.7–2.4)

## 2020-04-30 LAB — URINE CULTURE: Culture: >=100000 — AB

## 2020-04-30 LAB — RENAL FUNCTION PANEL
Albumin: 1.8 g/dL — ABNORMAL LOW (ref 3.5–5.0)
Anion gap: 12 (ref 5–15)
BUN: 89 mg/dL — ABNORMAL HIGH (ref 6–20)
CO2: 11 mmol/L — ABNORMAL LOW (ref 22–32)
Calcium: 7.8 mg/dL — ABNORMAL LOW (ref 8.9–10.3)
Chloride: 108 mmol/L (ref 98–111)
Creatinine, Ser: 5.58 mg/dL — ABNORMAL HIGH (ref 0.61–1.24)
GFR, Estimated: 12 mL/min — ABNORMAL LOW (ref >60)
Glucose, Bld: 146 mg/dL — ABNORMAL HIGH (ref 70–99)
Phosphorus: 10 mg/dL — ABNORMAL HIGH (ref 2.5–4.6)
Potassium: 6.3 mmol/L (ref 3.5–5.1)
Sodium: 131 mmol/L — ABNORMAL LOW (ref 135–145)

## 2020-04-30 LAB — C4 COMPLEMENT: Complement C4, Body Fluid: 5 mg/dL — ABNORMAL LOW (ref 12–38)

## 2020-04-30 LAB — ANTINUCLEAR ANTIBODIES, IFA: ANA Ab, IFA: POSITIVE — AB

## 2020-04-30 LAB — GLUCOSE, CAPILLARY: Glucose-Capillary: 148 mg/dL — ABNORMAL HIGH (ref 70–99)

## 2020-04-30 LAB — GLOMERULAR BASEMENT MEMBRANE ANTIBODIES: GBM Ab: 3 units (ref 0–20)

## 2020-04-30 LAB — ANTI-DNA ANTIBODY, DOUBLE-STRANDED: ds DNA Ab: >300 IU/mL — ABNORMAL HIGH (ref 0–9)

## 2020-04-30 LAB — C3 COMPLEMENT: C3 Complement: 47 mg/dL — ABNORMAL LOW (ref 82–167)

## 2020-04-30 LAB — ANTISTREPTOLYSIN O TITER: ASO: 355 IU/mL — ABNORMAL HIGH (ref 0.0–200.0)

## 2020-04-30 LAB — MRSA PCR SCREENING: MRSA by PCR: NEGATIVE

## 2020-04-30 MED ORDER — INSULIN ASPART 100 UNIT/ML IV SOLN
10.0000 [IU] | Freq: Once | INTRAVENOUS | Status: AC
  Administered 2020-04-30: 10 [IU] via INTRAVENOUS

## 2020-04-30 MED ORDER — HEPARIN SODIUM (PORCINE) 1000 UNIT/ML IJ SOLN
INTRAMUSCULAR | Status: AC | PRN
  Administered 2020-04-30: 2.6 mL

## 2020-04-30 MED ORDER — LIDOCAINE-EPINEPHRINE 1 %-1:100000 IJ SOLN
INTRAMUSCULAR | Status: AC
  Filled 2020-04-30: qty 1

## 2020-04-30 MED ORDER — HEPARIN SODIUM (PORCINE) 1000 UNIT/ML IJ SOLN
2600.0000 [IU] | Freq: Once | INTRAMUSCULAR | Status: AC
  Administered 2020-04-30: 2600 [IU]

## 2020-04-30 MED ORDER — CALCIUM GLUCONATE-NACL 2-0.675 GM/100ML-% IV SOLN
2.0000 g | Freq: Once | INTRAVENOUS | Status: AC
  Administered 2020-04-30: 2000 mg via INTRAVENOUS
  Filled 2020-04-30: qty 100

## 2020-04-30 MED ORDER — FENTANYL CITRATE (PF) 100 MCG/2ML IJ SOLN
INTRAMUSCULAR | Status: AC
  Filled 2020-04-30: qty 2

## 2020-04-30 MED ORDER — HEPARIN SODIUM (PORCINE) 1000 UNIT/ML IJ SOLN
INTRAMUSCULAR | Status: AC
  Filled 2020-04-30: qty 3

## 2020-04-30 MED ORDER — LIDOCAINE HCL 1 % IJ SOLN
INTRAMUSCULAR | Status: AC
  Filled 2020-04-30: qty 20

## 2020-04-30 MED ORDER — PROSOURCE PLUS PO LIQD
30.0000 mL | Freq: Three times a day (TID) | ORAL | Status: DC
  Administered 2020-04-30 – 2020-05-11 (×21): 30 mL via ORAL
  Filled 2020-04-30 (×22): qty 30

## 2020-04-30 MED ORDER — MIDAZOLAM HCL 2 MG/2ML IJ SOLN
INTRAMUSCULAR | Status: AC | PRN
  Administered 2020-04-30 (×2): 1 mg via INTRAVENOUS

## 2020-04-30 MED ORDER — LIDOCAINE HCL (PF) 1 % IJ SOLN
INTRAMUSCULAR | Status: AC | PRN
  Administered 2020-04-30: 10 mL via INTRADERMAL

## 2020-04-30 MED ORDER — DEXTROSE 50 % IV SOLN: 1.0000 | Freq: Once | INTRAVENOUS | Status: AC

## 2020-04-30 MED ORDER — CALCIUM GLUCONATE-NACL 1-0.675 GM/50ML-% IV SOLN
1.0000 g | Freq: Once | INTRAVENOUS | Status: DC
  Filled 2020-04-30: qty 50

## 2020-04-30 MED ORDER — RENA-VITE PO TABS
1.0000 | ORAL_TABLET | Freq: Every day | ORAL | Status: DC
  Administered 2020-04-30 – 2020-05-10 (×11): 1 via ORAL
  Filled 2020-04-30 (×12): qty 1

## 2020-04-30 MED ORDER — MIDAZOLAM HCL 2 MG/2ML IJ SOLN
INTRAMUSCULAR | Status: AC
  Filled 2020-04-30: qty 2

## 2020-04-30 MED ORDER — SODIUM BICARBONATE 8.4 % IV SOLN: 100.0000 meq | Freq: Once | INTRAVENOUS | Status: DC

## 2020-04-30 MED ORDER — FENTANYL CITRATE (PF) 100 MCG/2ML IJ SOLN
INTRAMUSCULAR | Status: AC | PRN
  Administered 2020-04-30: 50 ug via INTRAVENOUS

## 2020-04-30 MED ORDER — LIDOCAINE-EPINEPHRINE 1 %-1:100000 IJ SOLN
INTRAMUSCULAR | Status: AC | PRN
  Administered 2020-04-30: 10 mL via INTRADERMAL

## 2020-04-30 MED ORDER — INSULIN ASPART 100 UNIT/ML IV SOLN: 10.0000 [IU] | Freq: Once | INTRAVENOUS | Status: AC

## 2020-04-30 MED ORDER — HEPARIN SODIUM (PORCINE) 1000 UNIT/ML IJ SOLN
INTRAMUSCULAR | Status: AC
  Filled 2020-04-30: qty 1

## 2020-04-30 MED ORDER — SODIUM BICARBONATE 8.4 % IV SOLN
100.0000 meq | Freq: Once | INTRAVENOUS | Status: AC
  Administered 2020-04-30: 100 meq via INTRAVENOUS
  Filled 2020-04-30: qty 100

## 2020-04-30 MED ORDER — DEXTROSE 50 % IV SOLN
1.0000 | Freq: Once | INTRAVENOUS | Status: AC
  Administered 2020-04-30: 50 mL via INTRAVENOUS
  Filled 2020-04-30: qty 50

## 2020-04-30 NOTE — Plan of Care (Signed)

## 2020-04-30 NOTE — Progress Notes (Signed)
PROGRESS NOTE  [name] GEZ:[id number] DOB: 18-Oct-1970 DOA: 04/27/2020 PCP: [name], MD   LOS: 3 days   Brief narrative:  [name] is a 50 y.o. male with past medical history of lupus, chronic pancytopenia, B12 deficiency, hypothyroidism presented to the hospital with shortness of breath, abdominal distention and swelling of the legs for 3 weeks.  Patient does have history of lupus but is not taking any medication at this time.  Had been seen by rheumatologist 3 to 4 months back but stopped taking Imuran by himself and was not on prednisone.  In the ED, patient was to have severe abdominal distention as well as leg swelling.  CT scan of the chest and abdomen showed evidence of ascites as well as anasarca.  Work-up also showed hypoalbuminemia and significant AKI.  Patient was then admitted hospital for further evaluation and treatment.  Assessment/Plan:  Principal Problem:   Acute heart failure with preserved ejection fraction (HFpEF) (HCC) Active Problems:   Lupus (systemic lupus erythematosus) (HCC)   Dyspnea   Systemic lupus erythematosus with lung involvement (HCC)   Elevated TSH   Stage 3a chronic kidney disease (HCC)  Acute kidney injury with hyperkalemia and metabolic acidosis.  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  Management as per nephrology.  Renal biopsy has been ordered.  Patient has been started on high-dose steroids and high dose of IV Lasix.  Possibility of lupus nephritis.  Avoid nephrotoxic medications as much as possible.  CT scan of the abdomen pelvis without hydronephrosis.  On Foley catheter.  Strict intake and output charting.  Not much urinary output despite high-dose IV Lasix.    EKG stat due to significant hyperkalemia.  Communicated with nephrology.  On Lokelma.  Nephrology recommended sodium bicarb tablet as well.  Will give 1 dose of IV calcium gluconate, IV dextrose, insulin stat.  Awaiting for renal biopsy today.  Will likely need hemodialysis  soon.  I spoke with the transfer center for transfer to [facility] for transfer.  Nephrology is considering hemodialysis catheter placement today.   Acute HFpEF Significantly volume overloaded presentation.  Continue high-dose IV Lasix.  Not much urinary output.  2D echocardiogram on 11/2019 showed preserved ejection fraction.  Stress test was normal as well.  Continue intake and output charting, daily weights, continue diuresis.  Currently on room air.   Hyponatremia.  We will continue to monitor closely.  Check BMP in a.m.   history of lupus, pleurisy, pericardial effusion.  2D echocardiogram from 04/28/2020 showed LV ejection fraction of 55 to 60% with moderate LVH but no pericardial effusion but trace pleural effusion.  Followed by Dr. Kathlene November in the past.  Currently on IV Solu-Medrol.  Hypoalbuminemia Patient with proteinuria.  Poor appetite overall.  Dietary has been consulted  Bladder pain likely spasms.  after Foley catheter.  Improved.  On flavoxate, oxycodone    Lab Results  Component Value Date   CREATININE 5.58 (H) 04/30/2020   CREATININE 5.54 (H) 04/30/2020   CREATININE 4.83 (H) 04/29/2020    Ascites. Likely secondary to congestive heart failure and lupus.  Status post paracentesis with 2 L of fluid removal yesterday.  Still with significant ascites.  Considering repeat tapping today.  Peritoneal fluid culture and sensitivity negative so far.  Gram stain without organisms.  History of Lupus. Noncompliant with medication regimen in the past.  Will need continued follow-up as outpatient with rheumatologist.  Chronic pancytopenia/ leukopenia. Continue to monitor closely.  Was seen by oncology in  the past and had bone marrow biopsy.  No further intervention was planned at that time.   Hypothyroidism TSH of 7.8. Continue Synthroid.  Repeat her thyroid function as outpatient.   DVT prophylaxis: heparin injection 5,000 Units Start: 04/30/20 2200   Code Status: Full  code  Family Communication: None.  Status is: Inpatient  Remains inpatient appropriate because:IV treatments appropriate due to intensity of illness or inability to take PO and Inpatient level of care appropriate due to severity of illness, acute kidney injury likely RPGN, ascites, fluid overload, need for renal biopsy, IV high-dose diuretics and Solu-Medrol, likely need hemodialysis, will need transfer to [place]: The patient is from: Home              Anticipated d/c is to: Home              Anticipated d/c date is: 3 or more days              Patient currently is not medically stable to d/c.   Consultants: Nephrology  Procedures:  Paracentesis  Renal biopsy plan  Antibiotics:  . None  Anti-infectives (From admission, onward)   None     Subjective: Today, patient was seen and examined at bedside.  Has some urinary output.  Complains of abdominal distention.  Feels little hungry today without nausea.  Awaiting for renal biopsy.  Denies shortness of breath, chest pain or fever.  Objective: Vitals:   04/29/20 2310 04/30/20 0405  BP: (!) 149/86 (!) 144/89  Pulse: 96 89  Resp: 13 16  Temp: 98.1 F (36.7 C) 98.6 F (37 C)  SpO2: 98% 99%    Intake/Output Summary (Last 24 hours) at 04/30/2020 0829 Last data filed at 04/30/2020 0641 Gross per 24 hour  Intake 678.79 ml  Output 250 ml  Net 428.79 ml   Filed Weights   04/28/20 0614 04/29/20 0414 04/30/20 0500  Weight: 90.2 kg 88.5 kg 88.5 kg   Body mass index is 27.21 kg/m.   Physical Exam:  General:  Average built, not in obvious distress, alert awake and oriented. HENT:   No scleral pallor or icterus noted. Oral mucosa is moist.  Chest:  Diminished breath sounds bilaterally.  Coarse breath sounds noted. CVS: S1 &S2 heard. No murmur.  Regular rate and rhythm. Abdomen: Soft, nontender, distended abdomen with ascites, Foley catheter in place,bowel sounds are heard.   Extremities: No cyanosis,  clubbing but bilateral pitting edema noted, penile swelling peripheral pulses are palpable. Psych: Alert, awake and oriented, normal mood CNS:  No cranial nerve deficits.  Power equal in all extremities.   Skin: Warm and dry.  Pigmentation over the skin.  Data Review: I have personally reviewed the following laboratory data and studies,  CBC: Recent Labs  Lab 04/27/20 2016 04/28/20 0431 04/30/20 0403  WBC 2.8* 2.6* 2.0*  NEUTROABS 1.9 2.1 1.9  HGB 8.6* 8.3* 8.0*  HCT 25.9* 25.0* 24.2*  MCV 90.9 91.2 90.0  PLT 188 166 497   Basic Metabolic Panel: Recent Labs  Lab 04/27/20 2016 04/28/20 0431 04/29/20 0445 04/30/20 0403  NA 133* 135 133* 131*  131*  K 5.3* 5.4* 5.4* 6.3*  6.3*  CL 113* 113* 112* 108  108  CO2 11* 14* 12* 12*  11*  GLUCOSE 97 114* 90 146*  146*  BUN 82* 84* 85* 90*  89*  CREATININE 4.19* 4.19* 4.83* 5.54*  5.58*  CALCIUM 7.3* 7.6* 7.7* 7.8*  7.8*  MG  --   --   --  2.4  PHOS  --  8.8* 9.3* 9.1*  10.0*   Liver Function Tests: Recent Labs  Lab 04/27/20 2016 04/28/20 0431 04/29/20 0445 04/30/20 0403  AST 21  --   --  15  ALT 19  --   --  17  ALKPHOS 66  --   --  52  BILITOT 0.3  --   --  0.4  PROT 5.6*  --   --  5.5*  ALBUMIN 1.6* 1.9* 2.0* 1.8*  1.8*   No results for input(s): LIPASE, AMYLASE in the last 168 hours. No results for input(s): AMMONIA in the last 168 hours. Cardiac Enzymes: Recent Labs  Lab 04/28/20 0431  CKTOTAL 65   BNP (last 3 results) Recent Labs    04/27/20 2016  BNP 36.5    ProBNP (last 3 results) Recent Labs    11/06/19 1613  PROBNP 337.0*    CBG: No results for input(s): GLUCAP in the last 168 hours. Recent Results (from the past 240 hour(s))  Resp Panel by RT-PCR (Flu A&B, Covid) Nasopharyngeal Swab     Status: None   Collection Time: 04/27/20  8:22 PM   Specimen: Nasopharyngeal Swab; Nasopharyngeal(NP) swabs in vial transport medium  Result Value Ref Range Status   SARS Coronavirus 2 by RT PCR  NEGATIVE NEGATIVE Final    Comment: (NOTE) SARS-CoV-2 target nucleic acids are NOT DETECTED.  The SARS-CoV-2 RNA is generally detectable in upper respiratory specimens during the acute phase of infection. The lowest concentration of SARS-CoV-2 viral copies this assay can detect is 138 copies/mL. A negative result does not preclude SARS-Cov-2 infection and should not be used as the sole basis for treatment or other patient management decisions. A negative result may occur with  improper specimen collection/handling, submission of specimen other than nasopharyngeal swab, presence of viral mutation(s) within the areas targeted by this assay, and inadequate number of viral copies(<138 copies/mL). A negative result must be combined with clinical observations, patient history, and epidemiological information. The expected result is Negative.  Fact Sheet for Patients:  EntrepreneurPulse.com.au  Fact Sheet for Healthcare Providers:  IncredibleEmployment.be  This test is no t yet approved or cleared by the [place] FDA and  has been authorized for detection and/or diagnosis of SARS-CoV-2 by FDA under an Emergency Use Authorization (EUA). This EUA will remain  in effect (meaning this test can be used) for the duration of the COVID-19 declaration under Section 564(b)(1) of the Act, 21 U.S.C.section 360bbb-3(b)(1), unless the authorization is terminated  or revoked sooner.       Influenza A by PCR NEGATIVE NEGATIVE Final   Influenza B by PCR NEGATIVE NEGATIVE Final    Comment: (NOTE) The Xpert Xpress SARS-CoV-2/FLU/RSV plus assay is intended as an aid in the diagnosis of influenza from Nasopharyngeal swab specimens and should not be used as a sole basis for treatment. Nasal washings and aspirates are unacceptable for Xpert Xpress SARS-CoV-2/FLU/RSV testing.  Fact Sheet for Patients: EntrepreneurPulse.com.au  Fact Sheet for  Healthcare Providers: IncredibleEmployment.be  This test is not yet approved or cleared by the [place] FDA and has been authorized for detection and/or diagnosis of SARS-CoV-2 by FDA under an Emergency Use Authorization (EUA). This EUA will remain in effect (meaning this test can be used) for the duration of the COVID-19 declaration under Section 564(b)(1) of the Act, 21 U.S.C. section 360bbb-3(b)(1), unless the authorization is terminated or revoked.  Performed at [facility], [place] [address] [place], [place] [address]  Culture, Urine     Status: Abnormal (Preliminary result)   Collection Time: 04/28/20 11:27 AM   Specimen: Urine, Clean Catch  Result Value Ref Range Status   Specimen Description   Final    URINE, CLEAN CATCH Performed at [facility], [place] [address] [place], [place] [address]    Special Requests   Final    NONE Performed at [facility], [place] [address], [place], [place] [address]    Culture (A)  Final    >=100,000 COLONIES/mL STAPHYLOCOCCUS AUREUS SUSCEPTIBILITIES TO FOLLOW Performed at [facility], [place] [address], [place], [place] [address]    Report Status PENDING  Incomplete  Culture, body fluid-bottle     Status: None (Preliminary result)   Collection Time: 04/28/20 11:30 AM   Specimen: Peritoneal Washings  Result Value Ref Range Status   Specimen Description PERITONEAL FLUID  Final   Special Requests NONE  Final   Culture   Final    NO GROWTH 2 DAYS Performed at [facility] [address], [place], [place] [address]    Report Status PENDING  Incomplete  Gram stain     Status: None   Collection Time: 04/28/20 11:30 AM   Specimen: Peritoneal Washings  Result Value Ref Range Status   Specimen Description PERITONEAL FLUID  Final   Special Requests NONE  Final   Gram Stain   Final    WBC PRESENT,BOTH PMN AND MONONUCLEAR NO ORGANISMS SEEN CYTOSPIN  SMEAR Performed at [facility], [address] [place], [place] [address]    Report Status 04/28/2020 FINAL  Final     Studies: US RENAL  Result Date: 04/29/2020 CLINICAL DATA:  Acute kidney injury. EXAM: RENAL / URINARY TRACT ULTRASOUND COMPLETE COMPARISON:  CT scan 04/27/2020 FINDINGS: Right Kidney: Renal measurements: 11.1 x 4.2 x 5.1 = volume: 126 mL. Echogenicity within normal limits. No mass or hydronephrosis visualized. Left Kidney: Renal measurements: 11.6 x 5.7 x 5.9 cm = volume: 202 mL. Echogenicity within normal limits. No mass or hydronephrosis visualized. Bladder: Appears normal for degree of bladder distention. Other: Ascites IMPRESSION: 1. No evidence for hydronephrosis. 2. Large volume ascites Electronically Signed   By: Misty Stanley M.D.   On: 04/29/2020 07:17   US Paracentesis  Result Date: 04/28/2020 INDICATION: Patient with history of SLE, acute HF in fluid overload, abdominal distension, and ascites. Request is made for diagnostic and therapeutic paracentesis up to 2 L. EXAM: ULTRASOUND GUIDED DIAGNOSTIC AND THERAPEUTIC PARACENTESIS MEDICATIONS: 10 mL 1% lidocaine COMPLICATIONS: None immediate. PROCEDURE: Informed written consent was obtained from the patient after a discussion of the risks, benefits and alternatives to treatment. A timeout was performed prior to the initiation of the procedure. Initial ultrasound scanning demonstrates a large amount of ascites within the right lower abdominal quadrant. The right lower abdomen was prepped and draped in the usual sterile fashion. 1% lidocaine was used for local anesthesia. Following this, a 19 gauge, 7-cm, Yueh catheter was introduced. An ultrasound image was saved for documentation purposes. The paracentesis was performed. The catheter was removed and a dressing was applied. The patient tolerated the procedure well without immediate post procedural complication. Patient received post-procedure intravenous albumin; see nursing  notes for details. FINDINGS: A total of approximately 2 L of clear yellow fluid was removed. Samples were sent to the laboratory as requested by the clinical team. IMPRESSION: Successful ultrasound-guided paracentesis yielding 2 L of peritoneal fluid. Read by: Earley Abide, PA-C  Electronically Signed   By: Jacqulynn Cadet M.D.   On: 04/28/2020 11:17   ECHOCARDIOGRAM COMPLETE  Result Date: 04/28/2020    ECHOCARDIOGRAM REPORT   Patient Name:   [name] Date of Exam: 04/28/2020 Medical Rec #:  [id number]       Height:       71.0 in Accession #:    [id number]      Weight:       198.9 lb Date of Birth:  1971/04/02       BSA:          2.104 m Patient Age:    64 years        BP:           155/100 mmHg Patient Gender: M               HR:           84 bpm. Exam Location:  Inpatient Procedure: 2D Echo, 3D Echo, Cardiac Doppler, Color Doppler and Strain Analysis Indications:    Dyspnea R06.00  History:        Patient has prior history of Echocardiogram examinations, most                 recent 12/14/2019. Lupus- not on any medication. Chronic                 pancytopenia, hypothyroid. Shortness of breath, abdominal                 distention, and swelling of the legs for 3 weeks. Acute kidney                 injury with hyperkalemia and metabolic acidosis.  Sonographer:    Darlina Sicilian RDCS Referring Phys: [id number] [place]  1. Left ventricular ejection fraction, by estimation, is 55 to 60%. The left ventricle has normal function. The left ventricle has no regional wall motion abnormalities. There is moderate left ventricular hypertrophy. Left ventricular diastolic parameters were normal.  2. Right ventricular systolic function is normal. The right ventricular size is normal. Tricuspid regurgitation signal is inadequate for assessing PA pressure.  3. The mitral valve is grossly normal. Trivial mitral valve regurgitation.  4. The aortic valve is tricuspid. There is mild calcification of the aortic  valve including nodular calcification of the tip of the noncoronary cusp with restricted motion. Cannot completely exclude small vegetation involving the noncoronary cusp, although could be acoustic shadowing. If there is high concern for endocarditis, a TEE can be considered. Aortic valve regurgitation is trivial.  5. The inferior vena cava is normal in size with <50% respiratory variability, suggesting right atrial pressure of 8 mmHg.  6. Pleural fluid/possible ascites noted. FINDINGS  Left Ventricle: Left ventricular ejection fraction, by estimation, is 55 to 60%. The left ventricle has normal function. The left ventricle has no regional wall motion abnormalities. The left ventricular internal cavity size was normal in size. There is  moderate left ventricular hypertrophy. Left ventricular diastolic parameters were normal. Right Ventricle: The right ventricular size is normal. No increase in right ventricular wall thickness. Right ventricular systolic function is normal. Tricuspid regurgitation signal is inadequate for assessing PA pressure. Left Atrium: Left atrial size was normal in size. Right Atrium: Right atrial size was normal in size. Pericardium: There is no evidence of pericardial effusion. Mitral Valve: The mitral valve is grossly normal. There is mild thickening of the mitral valve leaflet(s). Trivial mitral valve regurgitation. Tricuspid Valve:  The tricuspid valve is grossly normal. Tricuspid valve regurgitation is mild. Aortic Valve: The aortic valve is tricuspid. There is mild calcification of the aortic valve. Aortic valve regurgitation is trivial. Pulmonic Valve: The pulmonic valve was grossly normal. Pulmonic valve regurgitation is trivial. Aorta: The aortic root is normal in size and structure. Venous: The inferior vena cava is normal in size with less than 50% respiratory variability, suggesting right atrial pressure of 8 mmHg. IAS/Shunts: No atrial level shunt detected by color flow Doppler.   LEFT VENTRICLE PLAX 2D LVIDd:         4.60 cm  Diastology LVIDs:         3.40 cm  LV e' medial:    8.49 cm/s LV PW:         1.30 cm  LV E/e' medial:  6.4 LV IVS:        1.80 cm  LV e' lateral:   10.40 cm/s LVOT diam:     2.60 cm  LV E/e' lateral: 5.2 LV SV:         121 LV SV Index:   57 LVOT Area:     5.31 cm                          3D Volume EF:                         3D EF:        58 %                         LV EDV:       169 ml                         LV ESV:       70 ml                         LV SV:        98 ml RIGHT VENTRICLE RV S prime:     15.80 cm/s TAPSE (M-mode): 2.2 cm LEFT ATRIUM             Index       RIGHT ATRIUM          Index LA diam:        3.50 cm 1.66 cm/m  RA Area:     9.47 cm LA Vol (A2C):   42.6 ml 20.25 ml/m RA Volume:   12.90 ml 6.13 ml/m LA Vol (A4C):   35.7 ml 16.97 ml/m LA Biplane Vol: 42.3 ml 20.11 ml/m  AORTIC VALVE LVOT Vmax:   112.00 cm/s LVOT Vmean:  77.300 cm/s LVOT VTI:    0.227 m  AORTA Ao Root diam: 3.80 cm Ao Asc diam:  3.50 cm MITRAL VALVE MV Area (PHT): 2.03 cm    SHUNTS MV Decel Time: 373 msec    Systemic VTI:  0.23 m MV E velocity: 54.40 cm/s  Systemic Diam: 2.60 cm MV A velocity: 81.80 cm/s MV E/A ratio:  0.67 Rozann Lesches MD Electronically signed by Rozann Lesches MD Signature Date/Time: 04/28/2020/2:47:01 PM    Final      Flora Lipps, MD  [facility] 04/30/2020  If 7PM-7AM, please contact night-coverage

## 2020-04-30 NOTE — Procedures (Signed)
Ultrasound-guided  therapeutic paracentesis performed yielding 5 liters of yellow fluid. No immediate complications. EBL< 2 cc.

## 2020-04-30 NOTE — Progress Notes (Addendum)
Initial Nutrition Assessment  DOCUMENTATION CODES:   Non-severe (moderate) malnutrition in context of chronic illness  INTERVENTION:  - will order 30 ml Prosource Plus TID, each supplement provides 100 kcal and 15 grams protein.  - will order 1 tablet rena-vite/day. - will provide more in-depth diet education during hospitalization.   NUTRITION DIAGNOSIS:   Moderate Malnutrition related to chronic illness (lupus) as evidenced by mild fat depletion,mild muscle depletion,moderate fat depletion,moderate muscle depletion.  GOAL:   Patient will meet greater than or equal to 90% of their needs  MONITOR:   PO intake,Supplement acceptance,Labs,Weight trends,I & O's  REASON FOR ASSESSMENT:   Malnutrition Screening Tool,Consult Assessment of nutrition requirement/status  ASSESSMENT:   50 y.o. male with medical history of lupus (not on taking any medications), chronic pancytopenia, vitamin B12 deficiency, and hypothyroidism. He presented to the ED with SOB, abdominal distention, and BLE swelling x3 weeks. In the ED, CT chest and abdomen showed ascites and anasarca. Workup revealed hypoalbuminemia and significant AKI.  Patient recently got back from IR for renal biopsy and temp HD cath placement. Patient laying in bed with no family or visitors present.   He had been NPO since midnight until diet changed back to Renal, 1.2 L restriction at 1403.   He reports that he lives alone but his son stays with him 50% of the time. He cooks at home or gets food from restaurants, stresses that he does not eat from fast food restaurants often and chooses restaurants with more nutritious foods.   He typically eats 1-2 meals/day. Over the past 2-3 weeks all of his symptoms began. He started to notice abdominal swelling and as swelling increased, his PO intake decreased. He gave the example that he would eat small bag of chips and be full for 2 days. He experienced nausea with PO intakes but did not vomit.  Since admission, appetite has increased, he's been eating more, continues to have nausea with intakes, and vomits with all PO intake since hospitalization. He denies pain with or difficulty with chewing or swallowing.   He reports BLE edema is chronic and that it makes ambulation difficulty because legs are heavy. He denies pain with ambulation at any point PTA. He does not use a cane or walker to aid with ambulation.  He reports UBW of 172-185 lb but is unsure the last time he was in this range. He has not been weighing himself at home because he does not like to see how much weight he has gained, even if it is fluid weight.   He has had 2 paracenteses since admission: one today with 5L removed and one on 1/2 with 2L removed.   He reports that since symptoms onset, he has had severe constipation. He tried stool softeners x2-4 days with no improvement and discontinued use as he was nervous that it would lead to diarrhea and that with difficulty ambulating, he would not be able to get to the toilet in time. Patient is unsure of the date of last BM.   Patient with questions about the Renal diet. Discussed fluid restriction and discussed that the Renal diet limits Na, K, and Phos and the rationale for this. Provided examples of foods high in each of these nutrients.   Informed patient that more in-depth diet education will be provided after transfer to [facility] and that handouts will be provided at that time so they are not lost in transfer.    Labs reviewed; CBG: 148 mg/dl, Na: 131 mmol/l,  K: 6.3 mmol/l, BUN: 89 mg/dl, creatinine: 5.58 mg/dl, Ca: 7.8 mg/dl, Phos: 10 mg/dl, GFR: 12.  Medications reviewed; 1 mg folvite/day, 160 mg IV lasix/day, 50 mcg oral synthroid/day, 500 mg solu-medrol x1 dose/day x3 days (1/3-1/5), 17 g miralax/day, 10 g lokelma/day, 1000 mcg oral cyanocobalamin/day.     NUTRITION - FOCUSED PHYSICAL EXAM:  Flowsheet Row Most Recent Value  Orbital Region Mild depletion  Upper Arm  Region Moderate depletion  Thoracic and Lumbar Region Unable to assess  [ascites]  Buccal Region Moderate depletion  Temple Region Mild depletion  Clavicle Bone Region Moderate depletion  Clavicle and Acromion Bone Region Moderate depletion  Scapular Bone Region Moderate depletion  Dorsal Hand Mild depletion  Patellar Region Unable to assess  Anterior Thigh Region Unable to assess  Posterior Calf Region Unable to assess  Edema (RD Assessment) Severe  [BLE]  Hair Reviewed  Eyes Reviewed  Mouth Reviewed  Skin Reviewed  Nails Reviewed       Diet Order:   Diet Order            Diet renal with fluid restriction Fluid restriction: 1200 mL Fluid; Room service appropriate? Yes; Fluid consistency: Thin  Diet effective now                 EDUCATION NEEDS:   Education needs have been addressed  Skin:  Skin Assessment: Reviewed RN Assessment  Last BM:  PTA/unknown  Height:   Ht Readings from Last 1 Encounters:  04/29/20 5\' 11"  (1.803 m)    Weight:   Wt Readings from Last 1 Encounters:  04/30/20 88.5 kg    Estimated Nutritional Needs:  Kcal:  2100-2300 kcal Protein:  100-115 grams Fluid:  >/= 1 L/day     Jarome Matin, MS, RD, LDN, CNSC Inpatient Clinical Dietitian RD pager # available in [facility]  After hours/weekend pager # available in [facility]

## 2020-04-30 NOTE — Progress Notes (Signed)
Admit: 04/27/2020 LOS: 3  33M hx/o SLE off IS presenting with AKI, hypervolemia/ascites, hematuria, proteinuria  Subjective:  Marland Kitchen SCr further worsened, K 6.3 this AM, rec Ca/Lokelma/Ins/HCO3 . IR assisted with LVP, temp HD cath, and rena lBx today, appreciate their help . Started on high dose solumedrol . Minimal UOP on high dose lasix . Further serologies; +dsDNA > 300; suppressed C3 and C4; weakly + ASO, neg antiGBM Ab  01/03 0701 - 01/04 0700 In: 678.8 [P.O.:420; IV Piggyback:258.8] Out: 250 [Urine:250]  Filed Weights   04/28/20 0614 04/29/20 0414 04/30/20 0500  Weight: 90.2 kg 88.5 kg 88.5 kg    Scheduled Meds: . Chlorhexidine Gluconate Cloth  6 each Topical Daily  . fentaNYL      . folic acid  1 mg Oral Daily  . heparin  5,000 Units Subcutaneous Q8H  . heparin sodium (porcine)      . levothyroxine  50 mcg Oral Q0600  . lidocaine      . lidocaine      . lidocaine-EPINEPHrine      . midazolam      . polyethylene glycol  17 g Oral Daily  . sodium chloride flush  3 mL Intravenous Q12H  . sodium zirconium cyclosilicate  10 g Oral Daily  . vitamin B-12  1,000 mcg Oral Daily   Continuous Infusions: . sodium chloride    . calcium gluconate    . furosemide 160 mg (04/30/20 0441)  . methylPREDNISolone (SOLU-MEDROL) injection 500 mg (04/29/20 1715)   PRN Meds:.sodium chloride, acetaminophen, bisacodyl, flavoxATE, ondansetron (ZOFRAN) IV, oxyCODONE-acetaminophen, sodium chloride flush  Current Labs: reviewed  Results for [name], [name] (MRN [id number]) as of 04/29/2020 13:40 Hepatitis B Surface Ag Latest Ref Range: NON REACTIVE  NON REACTIVE    Ref. Range 04/28/2020 19:21  HCV Ab Latest Ref Range: NON REACTIVE  NON REACTIVE   Results for [name], [name] (MRN [id number]) as of 04/29/2020 13:40 IV Screen 4th Generation wRfx  Latest Ref Range: Non Reactive  Non Reactive   Results for [name], [name] (MRN [id number]) as of 04/30/2020 14:03  Ref. Range 04/28/2020 19:21  ds DNA Ab  Latest Ref Range: 0 - 9 IU/mL >300 (H)    C4 5, C3 47  Physical Exam:  Blood pressure 117/60, pulse (!) 106, temperature 98.6 F (37 C), temperature source Oral, resp. rate 11, height 5\' 11"  (1.803 m), weight 88.5 kg, SpO2 100 %. Mildly unwell appearing RRR no rub Bibasilar crackles and diminished in bases 3 to 4+ LEE Distended, soft NCAT EOMI  A 1. AKI/RPGN with hematuria/proteinuria and hx/o #2;  1. hypocomplementemia with inc dsDNA; neg AntiGBM; pending ANA, ANCA studies 2. Renal Bx 04/30/20 3. Temp HD cath 04/30/20 4. Start solumedrol 500mg  IV x3d 04/29/20 5. Suspect Class III or IV, mabye V LN 2. Hx/o SLE, followed by [facility], prev on imuran off sev mo, hx/o pleuritis/pericarditis 3. Ascites/Hypervolemia likely related #1 and #4; TTE w/ nL LVEF and R sided function 1. LVP 2L 1/2 and then 5L on 1.4 4. Hypoalbuminemia 5. Pancytopenia 6. Hyperkalemia, mild 7. Metabolic Acidosis, NAG related to #1 8. Hyperphosphatemia  P . 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 Cont solumedrol 500 IV x3d then pred 60mg /d . Await renal Bx results . Needs HD with inc K and progressive renal failure, in for [facility] tfer then start HD today: 2K, 2h, 300/500, No heparin, 1L UF . Daily weights, Daily Renal Panel, Strict I/Os, Avoid nephrotoxins (NSAIDs, judicious  IV Contrast)   Pearson Grippe MD 04/30/2020, 2:04 PM  Recent Labs  Lab 04/28/20 0431 04/29/20 0445 04/30/20 0403  NA 135 133* 131*  131*  K 5.4* 5.4* 6.3*  6.3*  CL 113* 112* 108  108  CO2 14* 12* 12*  11*  GLUCOSE 114* 90 146*  146*  BUN 84* 85* 90*  89*  CREATININE 4.19* 4.83* 5.54*  5.58*  CALCIUM 7.6* 7.7* 7.8*  7.8*  PHOS 8.8* 9.3* 9.1*  10.0*   Recent Labs  Lab 04/27/20 2016 04/28/20 0431 04/30/20 0403  WBC 2.8* 2.6* 2.0*  NEUTROABS 1.9 2.1 1.9  HGB 8.6* 8.3* 8.0*  HCT 25.9* 25.0* 24.2*  MCV 90.9 91.2 90.0  PLT 188 166 174

## 2020-04-30 NOTE — Procedures (Signed)
Interventional Radiology Procedure Note  Procedure:  1) Ultrasound guided non-focal renal biopsy 2) Temporary hemodialysis catheter placement  Findings: Please refer to procedural dictation for full description.  Left lower pole 16 ga cortical biopsy x 2.  Samples placed in formalin and sent to Pathology.  Right IJ 14 Fr, 16 cm non-tunneled hemodialysis catheter placement, tip in the right atrium.  Complications: None immediate  Estimated Blood Loss: < 5 mL  Recommendations: Strict bedrest for 3 hours. Follow up Pathology results. Catheter ready for immediate use.  Low IJ puncture, amenable to conversion to tunneled HD catheter in the future if needed.   Ruthann Cancer, MD Pager: [phone]

## 2020-04-30 NOTE — Progress Notes (Signed)
Male called, identified herself as the sister of the Pt, Oren Section.  She wanted updated information on the Pt, such as status, procedure and labwork.  I advised her that the Pt was not on the floor, that he was off the floor, that she knew he was in dialysis.Marland Kitchen  She wanted to know if he would be getting blood, and if he would be getting labs after dialysis and the when after that. And wanted to speak the the MD tonight or tomorrow.  I told her the Pt's MD was gone for the night, and he would be back in the AM and we would have the day RN pass along to the MD to call the her, to call.   I told her that, with out asking her brother, permission to speak to her, I could not give her information, due to HIPPA, because her brother was A & O x 4.  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 I advised that with her brother off the floor, I could not verify who she was, and that right now, I could not give her any information, other than the fact, that her brother was here, arrived around 6 PM today, he was doing fine, there were not issued, and as she knew he was in dialysis at this time.  She was not happy to the fact that I would not give her medical information about the Pt, and medical orders and treatment plan about the Pt, even though she said she had spoken to her brother after he arrived to the unit and prior to him going to dialysis.

## 2020-04-30 NOTE — Progress Notes (Signed)
CRITICAL VALUE ALERT  Critical Value:  K-6.3 from 5.3 04/30/2019  Date & Time Notied: 0518 04/30/2020  Provider Notified: Stark Klein NP  Orders Received/Actions taken: Made aware/Awaitng Orders if any

## 2020-04-30 NOTE — Plan of Care (Signed)
  Problem: Education: Goal: Knowledge of General Education information will improve Description: Including pain rating scale, medication(s)/side effects and non-pharmacologic comfort measures Outcome: Progressing   Problem: Elimination: Goal: Will not experience complications related to bowel motility Outcome: Progressing Goal: Will not experience complications related to urinary retention Outcome: Progressing   Problem: Safety: Goal: Ability to remain free from injury will improve Outcome: Progressing   

## 2020-05-01 ENCOUNTER — Inpatient Hospital Stay (HOSPITAL_COMMUNITY): Payer: BC Managed Care – PPO

## 2020-05-01 DIAGNOSIS — I5031 Acute diastolic (congestive) heart failure: Secondary | ICD-10-CM | POA: Diagnosis not present

## 2020-05-01 DIAGNOSIS — E440 Moderate protein-calorie malnutrition: Secondary | ICD-10-CM | POA: Insufficient documentation

## 2020-05-01 LAB — CBC
HCT: 17.6 % — ABNORMAL LOW (ref 39.0–52.0)
HCT: 22.6 % — ABNORMAL LOW (ref 39.0–52.0)
HCT: 21.2 % — ABNORMAL LOW (ref 39.0–52.0)
Hemoglobin: 6.3 g/dL — CL (ref 13.0–17.0)
Hemoglobin: 8 g/dL — ABNORMAL LOW (ref 13.0–17.0)
Hemoglobin: 7.6 g/dL — ABNORMAL LOW (ref 13.0–17.0)
MCH: 30.6 pg (ref 26.0–34.0)
MCH: 30.3 pg (ref 26.0–34.0)
MCH: 30.8 pg (ref 26.0–34.0)
MCHC: 35.8 g/dL (ref 30.0–36.0)
MCHC: 35.4 g/dL (ref 30.0–36.0)
MCHC: 35.8 g/dL (ref 30.0–36.0)
MCV: 85.4 fL (ref 80.0–100.0)
MCV: 85.6 fL (ref 80.0–100.0)
MCV: 85.8 fL (ref 80.0–100.0)
Platelets: 141 10*3/uL — ABNORMAL LOW (ref 150–400)
Platelets: 189 10*3/uL (ref 150–400)
Platelets: 173 10*3/uL (ref 150–400)
RBC: 2.06 MIL/uL — ABNORMAL LOW (ref 4.22–5.81)
RBC: 2.64 MIL/uL — ABNORMAL LOW (ref 4.22–5.81)
RBC: 2.47 MIL/uL — ABNORMAL LOW (ref 4.22–5.81)
RDW: 12.3 % (ref 11.5–15.5)
RDW: 12.7 % (ref 11.5–15.5)
RDW: 12.7 % (ref 11.5–15.5)
WBC: 2.9 10*3/uL — ABNORMAL LOW (ref 4.0–10.5)
WBC: 5.2 10*3/uL (ref 4.0–10.5)
WBC: 5 10*3/uL (ref 4.0–10.5)
nRBC: 0 % (ref 0.0–0.2)
nRBC: 0 % (ref 0.0–0.2)
nRBC: 0 % (ref 0.0–0.2)

## 2020-05-01 LAB — CYTOLOGY - NON PAP

## 2020-05-01 LAB — COMPREHENSIVE METABOLIC PANEL
ALT: 16 U/L (ref 0–44)
AST: 15 U/L (ref 15–41)
Albumin: 1.5 g/dL — ABNORMAL LOW (ref 3.5–5.0)
Alkaline Phosphatase: 52 U/L (ref 38–126)
Anion gap: 7 (ref 5–15)
BUN: 66 mg/dL — ABNORMAL HIGH (ref 6–20)
CO2: 20 mmol/L — ABNORMAL LOW (ref 22–32)
Calcium: 7.4 mg/dL — ABNORMAL LOW (ref 8.9–10.3)
Chloride: 105 mmol/L (ref 98–111)
Creatinine, Ser: 5.12 mg/dL — ABNORMAL HIGH (ref 0.61–1.24)
GFR, Estimated: 13 mL/min — ABNORMAL LOW (ref >60)
Glucose, Bld: 175 mg/dL — ABNORMAL HIGH (ref 70–99)
Potassium: 4.7 mmol/L (ref 3.5–5.1)
Sodium: 132 mmol/L — ABNORMAL LOW (ref 135–145)
Total Bilirubin: 0.7 mg/dL (ref 0.3–1.2)
Total Protein: 4.8 g/dL — ABNORMAL LOW (ref 6.5–8.1)

## 2020-05-01 LAB — PHOSPHORUS: Phosphorus: 8 mg/dL — ABNORMAL HIGH (ref 2.5–4.6)

## 2020-05-01 LAB — MPO/PR-3 (ANCA) ANTIBODIES
ANCA Proteinase 3: <3.5 U/mL (ref 0.0–3.5)
Myeloperoxidase Abs: <9 U/mL (ref 0.0–9.0)

## 2020-05-01 LAB — HEMOGLOBIN AND HEMATOCRIT, BLOOD
HCT: 21.7 % — ABNORMAL LOW (ref 39.0–52.0)
Hemoglobin: 7.4 g/dL — ABNORMAL LOW (ref 13.0–17.0)

## 2020-05-01 LAB — ABO/RH: ABO/RH(D): O POS

## 2020-05-01 LAB — PREPARE RBC (CROSSMATCH)

## 2020-05-01 LAB — MAGNESIUM: Magnesium: 2 mg/dL (ref 1.7–2.4)

## 2020-05-01 MED ORDER — SODIUM CHLORIDE 0.9% IV SOLUTION: Freq: Once | INTRAVENOUS | Status: DC

## 2020-05-01 NOTE — Plan of Care (Signed)

## 2020-05-01 NOTE — Progress Notes (Signed)
Physical Therapy Treatment Patient Details Name: [name] MRN: [id number] DOB: [birth year] Today's Date: 05/01/2020    History of Present Illness 50 y.o. male with past medical history of lupus, chronic pancytopenia, B12 deficiency, hypothyroidism presented to hospital with shortness of breath abdominal distention and swelling of the legs for 3 weeks.  Patient does have history of lupus but is not taking any medication at this time.  Had been seen by rheumatologist 3 to 4 months back but stopped taking Imuran by himrself and was not on prednisone.  In the ED, patient was to have severe abdominal distention as well as leg swelling.  CT scan of the chest and abdomen showed evidence of ascites as well as anasarca.  Work-up also showed hypoalbuminemia. Transferred to [name] to start hemodialysis 04/30/20 s/p renal biopsy resulting in retropereneal hematoma.    PT Comments    Pt reports feeling tired today but agreeable to ambulation with therapy. Pt with continued LE swelling impacting proprioception of feet and mild instability with gait today, however pt able to self steady without assist. Pt is mod I for bed mobility and transfers and min guard for ambulation without AD. D/c plans remain appropriate. PT will continue to follow acutely.   Follow Up Recommendations  No PT follow up     Equipment Recommendations  None recommended by PT       Precautions / Restrictions Precautions Precautions: None    Mobility  Bed Mobility Overal bed mobility: Modified Independent                Transfers Overall transfer level: Modified independent                  Ambulation/Gait Ambulation/Gait assistance: Supervision Gait Distance (Feet): 250 Feet Assistive device: None Gait Pattern/deviations: Step-through pattern;Wide base of support;Decreased stride length Gait velocity: slowed Gait velocity interpretation: 1.31 - 2.62 ft/sec, indicative of limited community  ambulator General Gait Details: slow, mildly unsteady gait, no LoB but describes decreased prorpioception due to LE edema       Balance Overall balance assessment: Mild deficits observed, not formally tested                                          Cognition Arousal/Alertness: Awake/alert Behavior During Therapy: WFL for tasks assessed/performed Overall Cognitive Status: Within Functional Limits for tasks assessed                                           General Comments General comments (skin integrity, edema, etc.): VSS on RA      Pertinent Vitals/Pain Pain Assessment: Faces Faces Pain Scale: Hurts a little bit Pain Descriptors / Indicators: Grimacing;Guarding Pain Intervention(s): Limited activity within patient's tolerance;Monitored during session;Premedicated before session           PT Goals (current goals can now be found in the care plan section) Acute Rehab PT Goals PT Goal Formulation: With patient Time For Goal Achievement: 05/13/20 Potential to Achieve Goals: Good Progress towards PT goals: Progressing toward goals    Frequency    Min 3X/week      PT Plan Current plan remains appropriate       AM-PAC PT "6 Clicks" Mobility   Outcome Measure  Help needed turning from  your back to your side while in a flat bed without using bedrails?: None Help needed moving from lying on your back to sitting on the side of a flat bed without using bedrails?: None Help needed moving to and from a bed to a chair (including a wheelchair)?: None Help needed standing up from a chair using your arms (e.g., wheelchair or bedside chair)?: None Help needed to walk in hospital room?: A Little Help needed climbing 3-5 steps with a railing? : A Little 6 Click Score: 22    End of Session Equipment Utilized During Treatment: Gait belt Activity Tolerance: Patient tolerated treatment well Patient left: Other (comment) (in bathroom  performing self foley care) Nurse Communication: Mobility status;Other (comment) (left pt in bathroom) PT Visit Diagnosis: Difficulty in walking, not elsewhere classified (R26.2)     Time: 1520-1540 PT Time Calculation (min) (ACUTE ONLY): 20 min  Charges:  $Therapeutic Exercise: 8-22 mins                     Merina Behrendt B. [name] PT, DPT Acute Rehabilitation Services Pager [phone] Office [phone]    [place] 05/01/2020, 3:59 PM

## 2020-05-01 NOTE — Progress Notes (Addendum)
Referring Physician(s): Rexene Agent (nephrology)  Supervising Physician: Corrie Mckusick  Patient Status:  [facility] - In-pt  Chief Complaint: "Tired"  Subjective:  History of AKI s/p left random renal biopsy complicated by development of left retroperitoneal hematoma. Patient laying in bed resting comfortably. He responds to voice and answers simple questions appropriately. States he is tired today. Denies additional complaints or pain in any location. VSS. Hgb 7.4 (up from 6.3) following transfusion.  CT abdomen/pelvis 05/01/2020: 1. There is a left-sided retroperitoneal hematoma, posterior to the left kidney, measuring 11.4 x 6.8 x 2.8 cm. 2. Moderate volume ascites throughout the abdomen and pelvis, reduced compared to prior examination. 3. Anasarca.   Allergies: Patient has no known allergies.  Medications: Prior to Admission medications   Medication Sig Start Date End Date Taking? Authorizing Provider  folic acid (FOLVITE) 1 MG tablet Take 1 tablet by mouth once daily 01/16/20  Yes [name], MD  furosemide (LASIX) 40 MG tablet TAKE 1 TABLET BY MOUTH IN THE MORNING AND  1/2  (ONE-HALF)  TABLET  BY  MOUTH  IN  THE  LATE  AFTERNOON 04/09/20  Yes [name], MD  ibuprofen (ADVIL,MOTRIN) 200 MG tablet Take 200 mg by mouth every 6 (six) hours as needed (pain).   Yes [provider]  levothyroxine (SYNTHROID) 50 MCG tablet TAKE 1 TABLET BY MOUTH ONCE DAILY BEFORE BREAKFAST 01/16/20  Yes [name], MD  lisinopril (ZESTRIL) 20 MG tablet Take 1 tablet (20 mg total) by mouth daily. 11/27/19  Yes [name], MD  potassium chloride SA (KLOR-CON) 20 MEQ tablet Take 1 tablet (20 mEq total) by mouth daily. 11/06/19  Yes [name], MD  vitamin B-12 (CYANOCOBALAMIN) 1000 MCG tablet Take 1,000 mcg by mouth daily. 04/09/20  Yes [provider]  Vitamin D, Ergocalciferol, (DRISDOL) 1.25 MG (50000 UNIT) CAPS capsule Take 1  capsule (50,000 Units total) by mouth every 7 (seven) days. 11/12/19  Yes [name], MD  Cyanocobalamin (B-12) 1000 MCG CAPS Take one daily Patient not taking: Reported on 04/27/2020 11/27/19   [name], MD     Vital Signs: BP 139/89 (BP Location: Left Arm)   Pulse 87   Temp 97.9 F (36.6 C) (Oral)   Resp 15   Ht _0  (1.803 m)   Wt 183 lb 3.2 oz (83.1 kg)   SpO2 94%   BMI 25.55 kg/m   Physical Exam Vitals and nursing note reviewed.  Constitutional:      General: He is not in acute distress.    Appearance: Normal appearance.  Cardiovascular:     Rate and Rhythm: Normal rate and regular rhythm.     Heart sounds: Normal heart sounds. No murmur heard.   Pulmonary:     Effort: Pulmonary effort is normal. No respiratory distress.     Breath sounds: Normal breath sounds. No wheezing.     Comments: On RA. Abdominal:     Comments: Left flank at renal biopsy site soft without tenderness, active bleeding, or hematoma.  Skin:    General: Skin is warm and dry.  Neurological:     Mental Status: He is oriented to person, place, and time.     Imaging: CT ABDOMEN PELVIS WO CONTRAST  Result Date: 05/01/2020 CLINICAL DATA:  Anemia, falling hemoglobin, status post renal biopsy and paracentesis EXAM: CT ABDOMEN AND PELVIS WITHOUT CONTRAST TECHNIQUE: Multidetector CT imaging of the abdomen and pelvis was performed following the  standard protocol without IV contrast. COMPARISON:  04/27/2020 FINDINGS: Lower chest: Trace bilateral pleural effusions. Hepatobiliary: No solid liver abnormality is seen. No gallstones, gallbladder wall thickening, or biliary dilatation. Pancreas: Unremarkable. No pancreatic ductal dilatation or surrounding inflammatory changes. Spleen: Normal in size without significant abnormality. Adrenals/Urinary Tract: Adrenal glands are unremarkable. Kidneys are normal, without renal calculi, solid lesion, or hydronephrosis. Foley catheter in the bladder.  Stomach/Bowel: Stomach is within normal limits. Appendix appears normal. No evidence of bowel wall thickening, distention, or inflammatory changes. Sigmoid diverticulosis. Vascular/Lymphatic: No significant vascular findings are present. No enlarged abdominal or pelvic lymph nodes. Reproductive: No mass or other significant abnormality. Other: Anasarca. Moderate volume ascites throughout the abdomen and pelvis, reduced compared to prior examination. There is a left-sided retroperitoneal hematoma, posterior to the left kidney, measuring 11.4 x 6.8 x 2.8 cm (series 3, image 44, series 7, image 141). Musculoskeletal: No acute or significant osseous findings. IMPRESSION: 1. There is a left-sided retroperitoneal hematoma, posterior to the left kidney, measuring 11.4 x 6.8 x 2.8 cm. 2. Moderate volume ascites throughout the abdomen and pelvis, reduced compared to prior examination. 3. Anasarca. These results will be called to the ordering clinician or representative by the Radiologist Assistant, and communication documented in the PACS or [employer]. Electronically Signed   By: Eddie Candle M.D.   On: 05/01/2020 12:02   CT ABDOMEN PELVIS WO CONTRAST  Result Date: 04/27/2020 CLINICAL DATA:  Congestive heart failure, tachycardia, dyspnea, abdominal distension, lower extremity edema EXAM: CT CHEST, ABDOMEN AND PELVIS WITHOUT CONTRAST TECHNIQUE: Multidetector CT imaging of the chest, abdomen and pelvis was performed following the standard protocol without IV contrast. COMPARISON:  None. FINDINGS: CT CHEST FINDINGS Cardiovascular: Mild coronary artery calcification. Global cardiac size within normal limits. Small pericardial effusion. No CT evidence of cardiac tamponade. Central pulmonary arteries are of normal caliber. Thoracic aorta is unremarkable. Mediastinum/Nodes: Visualized thyroid unremarkable. No pathologic thoracic adenopathy. Esophagus unremarkable. Lungs/Pleura: Trace right and small left pleural effusions  are present with mild bibasilar atelectasis. Lungs are otherwise clear. No pneumothorax. Central airways are widely patent. Musculoskeletal: No acute bone abnormality. No lytic or blastic bone lesion. CT ABDOMEN PELVIS FINDINGS Hepatobiliary: No focal liver abnormality is seen. No gallstones, gallbladder wall thickening, or biliary dilatation. Pancreas: Unremarkable Spleen: Unremarkable Adrenals/Urinary Tract: Adrenal glands are unremarkable. Kidneys are normal, without renal calculi, focal lesion, or hydronephrosis. Bladder is unremarkable. Stomach/Bowel: Moderate descending and sigmoid colonic diverticulosis. Stomach, small bowel, and large bowel are otherwise unremarkable. Appendix normal. No free intraperitoneal gas. Large volume ascites is present. Vascular/Lymphatic: No significant vascular findings are present. No enlarged abdominal or pelvic lymph nodes. Reproductive: Prostate is unremarkable. Other: Rectum unremarkable.  No abdominal wall hernia. Musculoskeletal: Degenerative changes are seen at the lumbosacral junction. No lytic or blastic bone lesion is identified. No acute bone abnormality. There is moderate subcutaneous edema noted within the flanks and within the visualized lower extremities bilaterally. IMPRESSION: Large volume ascites and moderate subcutaneous edema involving the flanks and visualized lower extremities bilaterally demonstrating a dependent, lower extremity gradient. While this can be seen in the setting of right heart failure, this is nonspecific. Trace bilateral pleural effusions. Moderate sigmoid diverticulosis. Electronically Signed   By: Fidela Salisbury MD   On: 04/27/2020 23:14   CT Chest Wo Contrast  Result Date: 04/27/2020 CLINICAL DATA:  Congestive heart failure, tachycardia, dyspnea, abdominal distension, lower extremity edema EXAM: CT CHEST, ABDOMEN AND PELVIS WITHOUT CONTRAST TECHNIQUE: Multidetector CT imaging of the chest, abdomen and pelvis  was performed following the  standard protocol without IV contrast. COMPARISON:  None. FINDINGS: CT CHEST FINDINGS Cardiovascular: Mild coronary artery calcification. Global cardiac size within normal limits. Small pericardial effusion. No CT evidence of cardiac tamponade. Central pulmonary arteries are of normal caliber. Thoracic aorta is unremarkable. Mediastinum/Nodes: Visualized thyroid unremarkable. No pathologic thoracic adenopathy. Esophagus unremarkable. Lungs/Pleura: Trace right and small left pleural effusions are present with mild bibasilar atelectasis. Lungs are otherwise clear. No pneumothorax. Central airways are widely patent. Musculoskeletal: No acute bone abnormality. No lytic or blastic bone lesion. CT ABDOMEN PELVIS FINDINGS Hepatobiliary: No focal liver abnormality is seen. No gallstones, gallbladder wall thickening, or biliary dilatation. Pancreas: Unremarkable Spleen: Unremarkable Adrenals/Urinary Tract: Adrenal glands are unremarkable. Kidneys are normal, without renal calculi, focal lesion, or hydronephrosis. Bladder is unremarkable. Stomach/Bowel: Moderate descending and sigmoid colonic diverticulosis. Stomach, small bowel, and large bowel are otherwise unremarkable. Appendix normal. No free intraperitoneal gas. Large volume ascites is present. Vascular/Lymphatic: No significant vascular findings are present. No enlarged abdominal or pelvic lymph nodes. Reproductive: Prostate is unremarkable. Other: Rectum unremarkable.  No abdominal wall hernia. Musculoskeletal: Degenerative changes are seen at the lumbosacral junction. No lytic or blastic bone lesion is identified. No acute bone abnormality. There is moderate subcutaneous edema noted within the flanks and within the visualized lower extremities bilaterally. IMPRESSION: Large volume ascites and moderate subcutaneous edema involving the flanks and visualized lower extremities bilaterally demonstrating a dependent, lower extremity gradient. While this can be seen in the  setting of right heart failure, this is nonspecific. Trace bilateral pleural effusions. Moderate sigmoid diverticulosis. Electronically Signed   By: Fidela Salisbury MD   On: 04/27/2020 23:14   US RENAL  Result Date: 04/29/2020 CLINICAL DATA:  Acute kidney injury. EXAM: RENAL / URINARY TRACT ULTRASOUND COMPLETE COMPARISON:  CT scan 04/27/2020 FINDINGS: Right Kidney: Renal measurements: 11.1 x 4.2 x 5.1 = volume: 126 mL. Echogenicity within normal limits. No mass or hydronephrosis visualized. Left Kidney: Renal measurements: 11.6 x 5.7 x 5.9 cm = volume: 202 mL. Echogenicity within normal limits. No mass or hydronephrosis visualized. Bladder: Appears normal for degree of bladder distention. Other: Ascites IMPRESSION: 1. No evidence for hydronephrosis. 2. Large volume ascites Electronically Signed   By: Misty Stanley M.D.   On: 04/29/2020 07:17   US Paracentesis  Result Date: 04/30/2020 INDICATION: Patient with history of lupus, acute kidney injury, microhematuria, proteinuria, lower extremity edema, recurrent ascites; request received for therapeutic paracentesis prior to random renal biopsy. EXAM: ULTRASOUND GUIDED THERAPEUTIC PARACENTESIS MEDICATIONS: 1% lidocaine to skin and subcutaneous tissue COMPLICATIONS: None immediate. PROCEDURE: Informed written consent was obtained from the patient after a discussion of the risks, benefits and alternatives to treatment. A timeout was performed prior to the initiation of the procedure. Initial ultrasound scanning demonstrates a large amount of ascites within the right lower abdominal quadrant. The right lower abdomen was prepped and draped in the usual sterile fashion. 1% lidocaine was used for local anesthesia. Following this, a 19 gauge, 10-cm, Yueh catheter was introduced. An ultrasound image was saved for documentation purposes. The paracentesis was performed. The catheter was removed and a dressing was applied. The patient tolerated the procedure well without  immediate post procedural complication. FINDINGS: A total of approximately 5 liters of yellow fluid was removed. IMPRESSION: Successful ultrasound-guided therapeutic paracentesis yielding 5 liters of peritoneal fluid. Read by: Rowe Robert, PA-C Electronically Signed   By: Ruthann Cancer MD   On: 04/30/2020 12:44   US Paracentesis  Result Date: 04/28/2020  INDICATION: Patient with history of SLE, acute HF in fluid overload, abdominal distension, and ascites. Request is made for diagnostic and therapeutic paracentesis up to 2 L. EXAM: ULTRASOUND GUIDED DIAGNOSTIC AND THERAPEUTIC PARACENTESIS MEDICATIONS: 10 mL 1% lidocaine COMPLICATIONS: None immediate. PROCEDURE: Informed written consent was obtained from the patient after a discussion of the risks, benefits and alternatives to treatment. A timeout was performed prior to the initiation of the procedure. Initial ultrasound scanning demonstrates a large amount of ascites within the right lower abdominal quadrant. The right lower abdomen was prepped and draped in the usual sterile fashion. 1% lidocaine was used for local anesthesia. Following this, a 19 gauge, 7-cm, Yueh catheter was introduced. An ultrasound image was saved for documentation purposes. The paracentesis was performed. The catheter was removed and a dressing was applied. The patient tolerated the procedure well without immediate post procedural complication. Patient received post-procedure intravenous albumin; see nursing notes for details. FINDINGS: A total of approximately 2 L of clear yellow fluid was removed. Samples were sent to the laboratory as requested by the clinical team. IMPRESSION: Successful ultrasound-guided paracentesis yielding 2 L of peritoneal fluid. Read by: Earley Abide, PA-C Electronically Signed   By: Jacqulynn Cadet M.D.   On: 04/28/2020 11:17   IR Fluoro Guide CV Line Right  Result Date: 04/30/2020 INDICATION: 50 year old male with history of acute kidney injury on  chronic kidney disease requiring central venous access for hemodialysis. EXAM: NON-TUNNELED CENTRAL VENOUS HEMODIALYSIS CATHETER PLACEMENT WITH ULTRASOUND AND FLUOROSCOPIC GUIDANCE COMPARISON:  None. MEDICATIONS: None. Moderate (conscious) sedation was employed during this procedure. A total of Versed 0 mg and Fentanyl 50 mcg was administered intravenously. Moderate Sedation Time: 11 minutes. The patient's level of consciousness and vital signs were monitored continuously by radiology nursing throughout the procedure under my direct supervision. FLUOROSCOPY TIME:  0 minutes, 6 seconds (1 mGy) COMPLICATIONS: None immediate. PROCEDURE: Informed written consent was obtained from the patient after a discussion of the risks, benefits, and alternatives to treatment. Questions regarding the procedure were encouraged and answered. The right neck and chest were prepped with chlorhexidine in a sterile fashion, and a sterile drape was applied covering the operative field. Maximum barrier sterile technique with sterile gowns and gloves were used for the procedure. A timeout was performed prior to the initiation of the procedure. After the overlying soft tissues were anesthetized, a small venotomy incision was created and a micropuncture kit was utilized to access the internal jugular vein. Real-time ultrasound guidance was utilized for vascular access including the acquisition of a permanent ultrasound image documenting patency of the accessed vessel. The microwire was utilized to measure appropriate catheter length. A stiff glidewire was advanced to the level of the IVC. Under fluoroscopic guidance, the venotomy was serially dilated, ultimately allowing placement of a 16 cm temporary Trialysis catheter with tip ultimately terminating within the superior aspect of the right atrium. Final catheter positioning was confirmed and documented with a spot radiographic image. The catheter aspirates and flushes normally. The catheter  was flushed with appropriate volume heparin dwells. The catheter exit site was secured with a 0-Prolene retention suture. A dressing was placed. The patient tolerated the procedure well without immediate post procedural complication. IMPRESSION: Successful placement of a right internal jugular approach 16 cm temporary dialysis catheter with tip terminating with in the right atrium. The catheter is ready for immediate use. PLAN: This catheter may be converted to a tunneled dialysis catheter at a later date as indicated. Ruthann Cancer, MD [facility] Electronically Signed   By: Ruthann Cancer MD   On: 04/30/2020 13:27   IR US Guide Vasc Access Right  Result Date: 04/30/2020 INDICATION: 50 year old male with history of acute kidney injury on chronic kidney disease requiring central venous access for hemodialysis. EXAM: NON-TUNNELED CENTRAL VENOUS HEMODIALYSIS CATHETER PLACEMENT WITH ULTRASOUND AND FLUOROSCOPIC GUIDANCE COMPARISON:  None. MEDICATIONS: None. Moderate (conscious) sedation was employed during this procedure. A total of Versed 0 mg and Fentanyl 50 mcg was administered intravenously. Moderate Sedation Time: 11 minutes. The patient's level of consciousness and vital signs were monitored continuously by radiology nursing throughout the procedure under my direct supervision. FLUOROSCOPY TIME:  0 minutes, 6 seconds (1 mGy) COMPLICATIONS: None immediate. PROCEDURE: Informed written consent was obtained from the patient after a discussion of the risks, benefits, and alternatives to treatment. Questions regarding the procedure were encouraged and answered. The right neck and chest were prepped with chlorhexidine in a sterile fashion, and a sterile drape was applied covering the operative field. Maximum barrier sterile technique with sterile gowns and gloves were used for the procedure. A timeout was performed prior to the initiation of the procedure. After  the overlying soft tissues were anesthetized, a small venotomy incision was created and a micropuncture kit was utilized to access the internal jugular vein. Real-time ultrasound guidance was utilized for vascular access including the acquisition of a permanent ultrasound image documenting patency of the accessed vessel. The microwire was utilized to measure appropriate catheter length. A stiff glidewire was advanced to the level of the IVC. Under fluoroscopic guidance, the venotomy was serially dilated, ultimately allowing placement of a 16 cm temporary Trialysis catheter with tip ultimately terminating within the superior aspect of the right atrium. Final catheter positioning was confirmed and documented with a spot radiographic image. The catheter aspirates and flushes normally. The catheter was flushed with appropriate volume heparin dwells. The catheter exit site was secured with a 0-Prolene retention suture. A dressing was placed. The patient tolerated the procedure well without immediate post procedural complication. IMPRESSION: Successful placement of a right internal jugular approach 16 cm temporary dialysis catheter with tip terminating with in the right atrium. The catheter is ready for immediate use. PLAN: This catheter may be converted to a tunneled dialysis catheter at a later date as indicated. Ruthann Cancer, MD [facility] Radiology Electronically Signed   By: Ruthann Cancer MD   On: 04/30/2020 13:27   DG Chest Portable 1 View  Result Date: 04/27/2020 CLINICAL DATA:  Shortness of breath today.  History of CHF. EXAM: PORTABLE CHEST 1 VIEW COMPARISON:  11/01/2018 FINDINGS: Shallow inspiration with linear atelectasis in the lung bases, greater on the left. Heart size and pulmonary vascularity are probably normal for technique. No pleural effusions. No pneumothorax. Mediastinal contours appear intact. IMPRESSION: Shallow inspiration with linear  atelectasis in the lung bases, greater on the left. Electronically Signed   By: Lucienne Capers M.D.   On: 04/27/2020 20:42   ECHOCARDIOGRAM COMPLETE  Result Date: 04/28/2020    ECHOCARDIOGRAM REPORT   Patient Name:   [name] Date of Exam: 04/28/2020 Medical Rec #:  [id number]       Height:       71.0 in Accession #:    [id number]      Weight:       198.9 lb Date of Birth:  Dec 13, 1970       BSA:  2.104 m Patient Age:    1 years        BP:           155/100 mmHg Patient Gender: M               HR:           84 bpm. Exam Location:  Inpatient Procedure: 2D Echo, 3D Echo, Cardiac Doppler, Color Doppler and Strain Analysis Indications:    Dyspnea R06.00  History:        Patient has prior history of Echocardiogram examinations, most                 recent 12/14/2019. Lupus- not on any medication. Chronic                 pancytopenia, hypothyroid. Shortness of breath, abdominal                 distention, and swelling of the legs for 3 weeks. Acute kidney                 injury with hyperkalemia and metabolic acidosis.  Sonographer:    Darlina Sicilian RDCS Referring Phys: [id number] [place]  1. Left ventricular ejection fraction, by estimation, is 55 to 60%. The left ventricle has normal function. The left ventricle has no regional wall motion abnormalities. There is moderate left ventricular hypertrophy. Left ventricular diastolic parameters were normal.  2. Right ventricular systolic function is normal. The right ventricular size is normal. Tricuspid regurgitation signal is inadequate for assessing PA pressure.  3. The mitral valve is grossly normal. Trivial mitral valve regurgitation.  4. The aortic valve is tricuspid. There is mild calcification of the aortic valve including nodular calcification of the tip of the noncoronary cusp with restricted motion. Cannot completely exclude small vegetation involving the noncoronary cusp, although could be acoustic shadowing. If there is high  concern for endocarditis, a TEE can be considered. Aortic valve regurgitation is trivial.  5. The inferior vena cava is normal in size with <50% respiratory variability, suggesting right atrial pressure of 8 mmHg.  6. Pleural fluid/possible ascites noted. FINDINGS  Left Ventricle: Left ventricular ejection fraction, by estimation, is 55 to 60%. The left ventricle has normal function. The left ventricle has no regional wall motion abnormalities. The left ventricular internal cavity size was normal in size. There is  moderate left ventricular hypertrophy. Left ventricular diastolic parameters were normal. Right Ventricle: The right ventricular size is normal. No increase in right ventricular wall thickness. Right ventricular systolic function is normal. Tricuspid regurgitation signal is inadequate for assessing PA pressure. Left Atrium: Left atrial size was normal in size. Right Atrium: Right atrial size was normal in size. Pericardium: There is no evidence of pericardial effusion. Mitral Valve: The mitral valve is grossly normal. There is mild thickening of the mitral valve leaflet(s). Trivial mitral valve regurgitation. Tricuspid Valve: The tricuspid valve is grossly normal. Tricuspid valve regurgitation is mild. Aortic Valve: The aortic valve is tricuspid. There is mild calcification of the aortic valve. Aortic valve regurgitation is trivial. Pulmonic Valve: The pulmonic valve was grossly normal. Pulmonic valve regurgitation is trivial. Aorta: The aortic root is normal in size and structure. Venous: The inferior vena cava is normal in size with less than 50% respiratory variability, suggesting right atrial pressure of 8 mmHg. IAS/Shunts: No atrial level shunt detected by color flow Doppler.  LEFT VENTRICLE PLAX 2D LVIDd:         4.60  cm  Diastology LVIDs:         3.40 cm  LV e' medial:    8.49 cm/s LV PW:         1.30 cm  LV E/e' medial:  6.4 LV IVS:        1.80 cm  LV e' lateral:   10.40 cm/s LVOT diam:     2.60  cm  LV E/e' lateral: 5.2 LV SV:         121 LV SV Index:   57 LVOT Area:     5.31 cm                          3D Volume EF:                         3D EF:        58 %                         LV EDV:       169 ml                         LV ESV:       70 ml                         LV SV:        98 ml RIGHT VENTRICLE RV S prime:     15.80 cm/s TAPSE (M-mode): 2.2 cm LEFT ATRIUM             Index       RIGHT ATRIUM          Index LA diam:        3.50 cm 1.66 cm/m  RA Area:     9.47 cm LA Vol (A2C):   42.6 ml 20.25 ml/m RA Volume:   12.90 ml 6.13 ml/m LA Vol (A4C):   35.7 ml 16.97 ml/m LA Biplane Vol: 42.3 ml 20.11 ml/m  AORTIC VALVE LVOT Vmax:   112.00 cm/s LVOT Vmean:  77.300 cm/s LVOT VTI:    0.227 m  AORTA Ao Root diam: 3.80 cm Ao Asc diam:  3.50 cm MITRAL VALVE MV Area (PHT): 2.03 cm    SHUNTS MV Decel Time: 373 msec    Systemic VTI:  0.23 m MV E velocity: 54.40 cm/s  Systemic Diam: 2.60 cm MV A velocity: 81.80 cm/s MV E/A ratio:  0.67 Rozann Lesches MD Electronically signed by Rozann Lesches MD Signature Date/Time: 04/28/2020/2:47:01 PM    Final    US BIOPSY (KIDNEY)  Result Date: 04/30/2020 INDICATION: 49 year old male with acute kidney injury. EXAM: ULTRASOUND GUIDED RENAL BIOPSY COMPARISON:  None. MEDICATIONS: None. ANESTHESIA/SEDATION: Fentanyl 50 mcg IV; Versed 2 mg IV Total Moderate Sedation time: 10 minutes; The patient was continuously monitored during the procedure by the interventional radiology nurse under my direct supervision. COMPLICATIONS: None immediate. PROCEDURE: Informed written consent was obtained from the patient after a discussion of the risks, benefits and alternatives to treatment. The patient understands and consents the procedure. A timeout was performed prior to the initiation of the procedure. Ultrasound scanning was performed of the bilateral flanks. The inferior pole of the left kidney was selected for biopsy due to location and sonographic window. The procedure was  planned. The operative site was prepped and draped  in the usual sterile fashion. The overlying soft tissues were anesthetized with 1% lidocaine with epinephrine. A 16 gauge core needle biopsy device was advanced into the inferior cortex of the left kidney and 2 core biopsies were obtained under direct ultrasound guidance. Images were saved for documentation purposes. The biopsy device was removed and hemostasis was obtained with manual compression. Post procedural scanning was negative for significant post procedural hemorrhage or additional complication. A dressing was placed. The patient tolerated the procedure well without immediate post procedural complication. IMPRESSION: Technically successful ultrasound guided left renal biopsy. Ruthann Cancer, MD [facility] Electronically Signed   By: Ruthann Cancer MD   On: 04/30/2020 12:47    Labs:  CBC: Recent Labs    04/27/20 2016 04/28/20 0431 04/30/20 0403 05/01/20 0410 05/01/20 1330  WBC 2.8* 2.6* 2.0* 2.9*  --   HGB 8.6* 8.3* 8.0* 6.3* 7.4*  HCT 25.9* 25.0* 24.2* 17.6* 21.7*  PLT 188 166 174 141*  --     COAGS: Recent Labs    04/29/20 1358  INR 1.1    BMP: Recent Labs    04/28/20 0431 04/29/20 0445 04/30/20 0403 05/01/20 0410  NA 135 133* 131*  131* 132*  K 5.4* 5.4* 6.3*  6.3* 4.7  CL 113* 112* 108  108 105  CO2 14* 12* 12*  11* 20*  GLUCOSE 114* 90 146*  146* 175*  BUN 84* 85* 90*  89* 66*  CALCIUM 7.6* 7.7* 7.8*  7.8* 7.4*  CREATININE 4.19* 4.83* 5.54*  5.58* 5.12*  GFRNONAA 16* 14* 12*  12* 13*    LIVER FUNCTION TESTS: Recent Labs    01/08/20 1545 04/27/20 2016 04/28/20 0431 04/29/20 0445 04/30/20 0403 05/01/20 0410  BILITOT 0.2 0.3  --   --  0.4 0.7  AST 27 21  --   --  15 15  ALT 15 19  --   --  17 16  ALKPHOS 81 66  --   --  52 52  PROT 5.9* 5.6*  --   --  5.5* 4.8*  ALBUMIN 2.0* 1.6* 1.9* 2.0* 1.8*  1.8* 1.5*    Assessment and  Plan:  History of AKI s/p left random renal biopsy complicated by development of left retroperitoneal hematoma. Patient is hemodynamically stable at this time. Hgb up to 7.4 (from 6.3) following recent transfusion. Recommend continued monitoring of hemodynamics along with serial H&H. If Hgb continued to trend down, recommend repeat non-contrast (given AKI) CT for further evaluation. No plans for IR interventions at this time. If patient becomes hemodynamically unstable, please call IR regarding possible embolization procedure. Discussed above with patient. He understands he is currently hemodynamically stable and that we will continue to monitor. He is in agreement to proceed with procedure if needed.  Discussed above with patient's sister, Oren Section, via telephone per patient's request. All questions answered and concerns addressed. Further plans per [facility]/nephrology- appreciate and agree with management. IR remains available as needed, please call IR with questions/concerns.  The risks and benefits of embolization were discussed with the patient including, but not limited to bleeding, infection, vascular injury, post operative pain, or contrast induced renal failure. This procedure involves the use of X-rays and because of the nature of the planned procedure, it is possible that we will have prolonged use of X-ray fluoroscopy. Potential radiation risks to you include (but are not limited to) the following: - A slightly elevated risk for cancer several years later in  life. This risk is typically less than 0.5% percent. This risk is low in comparison to the normal incidence of human cancer, which is 33% for women and 50% for men according to the [employer]. - Radiation induced injury can include skin redness, resembling a rash, tissue breakdown / ulcers and hair loss (which can be temporary or permanent).  The likelihood of either of these occurring depends on the difficulty of the  procedure and whether you are sensitive to radiation due to previous procedures, disease, or genetic conditions.  IF your procedure requires a prolonged use of radiation, you will be notified and given written instructions for further action.  It is your responsibility to monitor the irradiated area for the 2 weeks following the procedure and to notify your physician if you are concerned that you have suffered a radiation induced injury.   All of the patient's questions were answered, patient is agreeable to proceed. Consent signed and in chart.   Electronically Signed: Earley Abide, PA-C 05/01/2020, 2:28 PM   I spent a total of 35 Minutes at the the patient's bedside AND on the patient's hospital floor or unit, greater than 50% of which was counseling/coordinating care for left retroperitoneal hematoma.

## 2020-05-01 NOTE — Progress Notes (Signed)
PROGRESS NOTE  [name] GYJ:[id number] DOB: [birth year] DOA: 04/27/2020 PCP: [name], MD   LOS: 4 days   Brief narrative:  [name] is a 50 y.o. male with past medical history of lupus, chronic pancytopenia, B12 deficiency, hypothyroidism presented to the hospital with shortness of breath, abdominal distention and swelling of the legs for 3 weeks.  Patient does have history of lupus, was seen by rheumatologist 3 to 4 months ago but stopped taking Imuran by himself. In the ED, patient was to have severe abdominal distention as well as leg swelling.  CT scan of the chest and abdomen showed evidence of ascites as well as anasarca.  Work-up also showed hypoalbuminemia and significant AKI.  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, suppressed C3 and C4, weakly positive ASO, negative anti-GBM -Nephrology following -Started on high-dose IV Solu-Medrol and high-dose IV Lasix -Remains profoundly volume overloaded, started hemodialysis 1/4 -Underwent kidney biopsy in IR 1/4, complicated by large retroperitoneal hematoma, await biopsy results -Per renal, underwent paracentesis on 1/4, 5 L drained  Acute blood loss anemia Large retroperitoneal hematoma -Following kidney biopsy, CT scan today shows 11 x 6 cm hematoma -1 unit of PRBC transfused, monitor hemoglobin closely, anticipate need for more blood transfusions, remains hemodynamically stable  Hyponatremia.  -Secondary to hypervolemia, monitor with HD   history of lupus, pleurisy, pericardial effusion -  2D echocardiogram from 04/28/2020 showed LV ejection fraction of 55 to 60% with moderate LVH but no pericardial effusion but trace pleural  effusion.  Followed by Dr. Kathlene November in the past.  Currently on IV Solu-Medrol.  Bladder pain likely spasms.  after Foley catheter.  Improved.  On flavoxate, oxycodone  History of Lupus. Noncompliant with medication regimen in the past.  Will need continued follow-up as outpatient with rheumatologist.  Chronic pancytopenia/ leukopenia. Continue to monitor closely.  Was seen by oncology in the past and had bone marrow biopsy.  No further intervention was planned at that time.   Hypothyroidism TSH of 7.8. Continue Synthroid.  Repeat her thyroid function as outpatient.   DVT prophylaxis: SCDs   Code Status: Full code  Family Communication: None.  Status is: Inpatient  Remains inpatient appropriate because:IV treatments appropriate due to intensity of illness or inability to take PO and Inpatient level of care appropriate due to severity of illness, severe AKI with fluid overload  Dispo: The patient is from: Home              Anticipated d/c is to: Home              Anticipated d/c date is: >3days              Patient currently is not medically stable to d/c.   Consultants: Nephrology IR  Procedures:  Paracentesis  Renal biopsy plan  Antibiotics:  . None  Anti-infectives (From admission, onward)   None     Subjective: -Hemoglobin down to 6.3 this morning  Objective: Vitals:   05/01/20 1112 05/01/20 1147  BP: 130/85 139/89  Pulse: 78 87  Resp:    Temp: 97.6 F (36.4 C) 97.9 F (36.6 C)  SpO2: 94%     Intake/Output Summary (Last 24 hours) at 05/01/2020 1328 Last data filed at 05/01/2020 1112 Gross per 24 hour  Intake 706.58 ml  Output 1025 ml  Net -318.42 ml   Filed Weights   04/30/20 1950 04/30/20 2155 05/01/20 0607  Weight: 84.6 kg 83.6 kg 83.1 kg   Body mass index is 25.55 kg/m.   Physical Exam:  General: Pleasant male, laying in bed, AAOx3, no distress HEENT: Right IJ HD catheter noted CVS: S1-S2, regular rate rhythm Lungs: Decreased breath  sounds at both bases Abdomen: Soft, nontender, bowel sounds present Extremities: 3+ edema  Skin: No rashes on exposed skin  Data Review: I have personally reviewed the following laboratory data and studies,  CBC: Recent Labs  Lab 04/27/20 2016 04/28/20 0431 04/30/20 0403 05/01/20 0410  WBC 2.8* 2.6* 2.0* 2.9*  NEUTROABS 1.9 2.1 1.9  --   HGB 8.6* 8.3* 8.0* 6.3*  HCT 25.9* 25.0* 24.2* 17.6*  MCV 90.9 91.2 90.0 85.4  PLT 188 166 174 973*   Basic Metabolic Panel: Recent Labs  Lab 04/27/20 2016 04/28/20 0431 04/29/20 0445 04/30/20 0403 05/01/20 0410  NA 133* 135 133* 131*  131* 132*  K 5.3* 5.4* 5.4* 6.3*  6.3* 4.7  CL 113* 113* 112* 108  108 105  CO2 11* 14* 12* 12*  11* 20*  GLUCOSE 97 114* 90 146*  146* 175*  BUN 82* 84* 85* 90*  89* 66*  CREATININE 4.19* 4.19* 4.83* 5.54*  5.58* 5.12*  CALCIUM 7.3* 7.6* 7.7* 7.8*  7.8* 7.4*  MG  --   --   --  2.4 2.0  PHOS  --  8.8* 9.3* 9.1*  10.0* 8.0*   Liver Function Tests: Recent Labs  Lab 04/27/20 2016 04/28/20 0431 04/29/20 0445 04/30/20 0403 05/01/20 0410  AST 21  --   --  15 15  ALT 19  --   --  17 16  ALKPHOS 66  --   --  52 52  BILITOT 0.3  --   --  0.4 0.7  PROT 5.6*  --   --  5.5* 4.8*  ALBUMIN 1.6* 1.9* 2.0* 1.8*  1.8* 1.5*   No results for input(s): LIPASE, AMYLASE in the last 168 hours. No results for input(s): AMMONIA in the last 168 hours. Cardiac Enzymes: Recent Labs  Lab 04/28/20 0431  CKTOTAL 65   BNP (last 3 results) Recent Labs    04/27/20 2016  BNP 36.5    ProBNP (last 3 results) Recent Labs    11/06/19 1613  PROBNP 337.0*    CBG: Recent Labs  Lab 04/30/20 1056  GLUCAP 148*   Recent Results (from the past 240 hour(s))  Resp Panel by RT-PCR (Flu A&B, Covid) Nasopharyngeal Swab     Status: None   Collection Time: 04/27/20  8:22 PM   Specimen: Nasopharyngeal Swab; Nasopharyngeal(NP) swabs in vial transport medium  Result Value Ref Range Status   SARS Coronavirus 2  by RT PCR NEGATIVE NEGATIVE Final    Comment: (NOTE) SARS-CoV-2 target nucleic acids are NOT DETECTED.  The SARS-CoV-2 RNA is generally detectable in upper respiratory specimens during the acute phase of infection. The lowest concentration of SARS-CoV-2 viral copies this assay can detect is 138 copies/mL. A negative result does not preclude SARS-Cov-2 infection and should not be used as the sole basis for treatment or other patient management decisions. A negative result may occur with  improper specimen collection/handling, submission of specimen other than nasopharyngeal swab, presence of viral mutation(s) within the areas targeted by this assay, and inadequate number of viral copies(<138 copies/mL). A negative result must be  combined with clinical observations, patient history, and epidemiological information. The expected result is Negative.  Fact Sheet for Patients:  EntrepreneurPulse.com.au  Fact Sheet for Healthcare Providers:  IncredibleEmployment.be  This test is no t yet approved or cleared by the [place] FDA and  has been authorized for detection and/or diagnosis of SARS-CoV-2 by FDA under an Emergency Use Authorization (EUA). This EUA will remain  in effect (meaning this test can be used) for the duration of the COVID-19 declaration under Section 564(b)(1) of the Act, 21 U.S.C.section 360bbb-3(b)(1), unless the authorization is terminated  or revoked sooner.       Influenza A by PCR NEGATIVE NEGATIVE Final   Influenza B by PCR NEGATIVE NEGATIVE Final    Comment: (NOTE) The Xpert Xpress SARS-CoV-2/FLU/RSV plus assay is intended as an aid in the diagnosis of influenza from Nasopharyngeal swab specimens and should not be used as a sole basis for treatment. Nasal washings and aspirates are unacceptable for Xpert Xpress SARS-CoV-2/FLU/RSV testing.  Fact Sheet for Patients: EntrepreneurPulse.com.au  Fact  Sheet for Healthcare Providers: IncredibleEmployment.be  This test is not yet approved or cleared by the [place] FDA and has been authorized for detection and/or diagnosis of SARS-CoV-2 by FDA under an Emergency Use Authorization (EUA). This EUA will remain in effect (meaning this test can be used) for the duration of the COVID-19 declaration under Section 564(b)(1) of the Act, 21 U.S.C. section 360bbb-3(b)(1), unless the authorization is terminated or revoked.  Performed at [facility], [place] [address], [place], [place] [address]   Culture, Urine     Status: Abnormal   Collection Time: 04/28/20 11:27 AM   Specimen: Urine, Clean Catch  Result Value Ref Range Status   Specimen Description   Final    URINE, CLEAN CATCH Performed at [facility], [place] [address] [place], [place] [address]    Special Requests   Final    NONE Performed at [facility], [place] [address], [place], [place] [address]    Culture >=100,000 COLONIES/mL STAPHYLOCOCCUS AUREUS (A)  Final   Report Status 04/30/2020 FINAL  Final   Organism ID, Bacteria STAPHYLOCOCCUS AUREUS (A)  Final      Susceptibility   Staphylococcus aureus - MIC*    CIPROFLOXACIN <=0.5 SENSITIVE Sensitive     GENTAMICIN <=0.5 SENSITIVE Sensitive     NITROFURANTOIN <=16 SENSITIVE Sensitive     OXACILLIN <=0.25 SENSITIVE Sensitive     TETRACYCLINE <=1 SENSITIVE Sensitive     VANCOMYCIN 1 SENSITIVE Sensitive     TRIMETH/SULFA <=10 SENSITIVE Sensitive     CLINDAMYCIN <=0.25 SENSITIVE Sensitive     RIFAMPIN <=0.5 SENSITIVE Sensitive     Inducible Clindamycin NEGATIVE Sensitive     * >=100,000 COLONIES/mL STAPHYLOCOCCUS AUREUS  Culture, body fluid-bottle     Status: None (Preliminary result)   Collection Time: 04/28/20 11:30 AM   Specimen: Peritoneal Washings  Result Value Ref Range Status   Specimen Description PERITONEAL FLUID  Final   Special Requests NONE   Final   Culture   Final    NO GROWTH 3 DAYS Performed at [place] [address], [place], [place] [address]    Report Status PENDING  Incomplete  Gram stain     Status: None   Collection Time: 04/28/20 11:30 AM   Specimen: Peritoneal Washings  Result Value Ref Range Status   Specimen Description PERITONEAL FLUID  Final   Special Requests NONE  Final   Gram  Stain   Final    WBC PRESENT,BOTH PMN AND MONONUCLEAR NO ORGANISMS SEEN CYTOSPIN SMEAR Performed at [facility], [place] [address], [place], [place] [address]    Report Status 04/28/2020 FINAL  Final  MRSA PCR Screening     Status: None   Collection Time: 04/30/20  5:59 PM   Specimen: Nasal Mucosa; Nasopharyngeal  Result Value Ref Range Status   MRSA by PCR NEGATIVE NEGATIVE Final    Comment:        The GeneXpert MRSA Assay (FDA approved for NASAL specimens only), is one component of a comprehensive MRSA colonization surveillance program. It is not intended to diagnose MRSA infection nor to guide or monitor treatment for MRSA infections. Performed at [facility], [place] [address] [place], [place] [address]      Studies: CT ABDOMEN PELVIS WO CONTRAST  Result Date: 05/01/2020 CLINICAL DATA:  Anemia, falling hemoglobin, status post renal biopsy and paracentesis EXAM: CT ABDOMEN AND PELVIS WITHOUT CONTRAST TECHNIQUE: Multidetector CT imaging of the abdomen and pelvis was performed following the standard protocol without IV contrast. COMPARISON:  04/27/2020 FINDINGS: Lower chest: Trace bilateral pleural effusions. Hepatobiliary: No solid liver abnormality is seen. No gallstones, gallbladder wall thickening, or biliary dilatation. Pancreas: Unremarkable. No pancreatic ductal dilatation or surrounding inflammatory changes. Spleen: Normal in size without significant abnormality. Adrenals/Urinary Tract: Adrenal glands are unremarkable. Kidneys are normal, without renal calculi, solid lesion, or hydronephrosis.  Foley catheter in the bladder. Stomach/Bowel: Stomach is within normal limits. Appendix appears normal. No evidence of bowel wall thickening, distention, or inflammatory changes. Sigmoid diverticulosis. Vascular/Lymphatic: No significant vascular findings are present. No enlarged abdominal or pelvic lymph nodes. Reproductive: No mass or other significant abnormality. Other: Anasarca. Moderate volume ascites throughout the abdomen and pelvis, reduced compared to prior examination. There is a left-sided retroperitoneal hematoma, posterior to the left kidney, measuring 11.4 x 6.8 x 2.8 cm (series 3, image 44, series 7, image 141). Musculoskeletal: No acute or significant osseous findings. IMPRESSION: 1. There is a left-sided retroperitoneal hematoma, posterior to the left kidney, measuring 11.4 x 6.8 x 2.8 cm. 2. Moderate volume ascites throughout the abdomen and pelvis, reduced compared to prior examination. 3. Anasarca. These results will be called to the ordering clinician or representative by the Radiologist Assistant, and communication documented in the PACS or [employer]. Electronically Signed   By: Eddie Candle M.D.   On: 05/01/2020 12:02   US Paracentesis  Result Date: 04/30/2020 INDICATION: Patient with history of lupus, acute kidney injury, microhematuria, proteinuria, lower extremity edema, recurrent ascites; request received for therapeutic paracentesis prior to random renal biopsy. EXAM: ULTRASOUND GUIDED THERAPEUTIC PARACENTESIS MEDICATIONS: 1% lidocaine to skin and subcutaneous tissue COMPLICATIONS: None immediate. PROCEDURE: Informed written consent was obtained from the patient after a discussion of the risks, benefits and alternatives to treatment. A timeout was performed prior to the initiation of the procedure. Initial ultrasound scanning demonstrates a large amount of ascites within the right lower abdominal quadrant. The right lower abdomen was prepped and draped in the usual sterile  fashion. 1% lidocaine was used for local anesthesia. Following this, a 19 gauge, 10-cm, Yueh catheter was introduced. An ultrasound image was saved for documentation purposes. The paracentesis was performed. The catheter was removed and a dressing was applied. The patient tolerated the procedure well without immediate post procedural complication. FINDINGS: A total of approximately 5 liters of yellow fluid was removed. IMPRESSION: Successful ultrasound-guided therapeutic paracentesis yielding 5 liters of peritoneal fluid.  Read by: Rowe Robert, PA-C Electronically Signed   By: Ruthann Cancer MD   On: 04/30/2020 12:44   IR Fluoro Guide CV Line Right  Result Date: 04/30/2020 INDICATION: 50 year old male with history of acute kidney injury on chronic kidney disease requiring central venous access for hemodialysis. EXAM: NON-TUNNELED CENTRAL VENOUS HEMODIALYSIS CATHETER PLACEMENT WITH ULTRASOUND AND FLUOROSCOPIC GUIDANCE COMPARISON:  None. MEDICATIONS: None. Moderate (conscious) sedation was employed during this procedure. A total of Versed 0 mg and Fentanyl 50 mcg was administered intravenously. Moderate Sedation Time: 11 minutes. The patient's level of consciousness and vital signs were monitored continuously by radiology nursing throughout the procedure under my direct supervision. FLUOROSCOPY TIME:  0 minutes, 6 seconds (1 mGy) COMPLICATIONS: None immediate. PROCEDURE: Informed written consent was obtained from the patient after a discussion of the risks, benefits, and alternatives to treatment. Questions regarding the procedure were encouraged and answered. The right neck and chest were prepped with chlorhexidine in a sterile fashion, and a sterile drape was applied covering the operative field. Maximum barrier sterile technique with sterile gowns and gloves were used for the procedure. A timeout was performed prior to the initiation of the procedure. After the overlying soft tissues were anesthetized, a small  venotomy incision was created and a micropuncture kit was utilized to access the internal jugular vein. Real-time ultrasound guidance was utilized for vascular access including the acquisition of a permanent ultrasound image documenting patency of the accessed vessel. The microwire was utilized to measure appropriate catheter length. A stiff glidewire was advanced to the level of the IVC. Under fluoroscopic guidance, the venotomy was serially dilated, ultimately allowing placement of a 16 cm temporary Trialysis catheter with tip ultimately terminating within the superior aspect of the right atrium. Final catheter positioning was confirmed and documented with a spot radiographic image. The catheter aspirates and flushes normally. The catheter was flushed with appropriate volume heparin dwells. The catheter exit site was secured with a 0-Prolene retention suture. A dressing was placed. The patient tolerated the procedure well without immediate post procedural complication. IMPRESSION: Successful placement of a right internal jugular approach 16 cm temporary dialysis catheter with tip terminating with in the right atrium. The catheter is ready for immediate use. PLAN: This catheter may be converted to a tunneled dialysis catheter at a later date as indicated. Ruthann Cancer, MD [facility] Radiology Electronically Signed   By: Ruthann Cancer MD   On: 04/30/2020 13:27   IR US Guide Vasc Access Right  Result Date: 04/30/2020 INDICATION: 50 year old male with history of acute kidney injury on chronic kidney disease requiring central venous access for hemodialysis. EXAM: NON-TUNNELED CENTRAL VENOUS HEMODIALYSIS CATHETER PLACEMENT WITH ULTRASOUND AND FLUOROSCOPIC GUIDANCE COMPARISON:  None. MEDICATIONS: None. Moderate (conscious) sedation was employed during this procedure. A total of Versed 0 mg and Fentanyl 50 mcg was administered intravenously. Moderate Sedation Time: 11  minutes. The patient's level of consciousness and vital signs were monitored continuously by radiology nursing throughout the procedure under my direct supervision. FLUOROSCOPY TIME:  0 minutes, 6 seconds (1 mGy) COMPLICATIONS: None immediate. PROCEDURE: Informed written consent was obtained from the patient after a discussion of the risks, benefits, and alternatives to treatment. Questions regarding the procedure were encouraged and answered. The right neck and chest were prepped with chlorhexidine in a sterile fashion, and a sterile drape was applied covering the operative field. Maximum barrier sterile technique with sterile gowns and gloves were used for the procedure. A timeout was performed  prior to the initiation of the procedure. After the overlying soft tissues were anesthetized, a small venotomy incision was created and a micropuncture kit was utilized to access the internal jugular vein. Real-time ultrasound guidance was utilized for vascular access including the acquisition of a permanent ultrasound image documenting patency of the accessed vessel. The microwire was utilized to measure appropriate catheter length. A stiff glidewire was advanced to the level of the IVC. Under fluoroscopic guidance, the venotomy was serially dilated, ultimately allowing placement of a 16 cm temporary Trialysis catheter with tip ultimately terminating within the superior aspect of the right atrium. Final catheter positioning was confirmed and documented with a spot radiographic image. The catheter aspirates and flushes normally. The catheter was flushed with appropriate volume heparin dwells. The catheter exit site was secured with a 0-Prolene retention suture. A dressing was placed. The patient tolerated the procedure well without immediate post procedural complication. IMPRESSION: Successful placement of a right internal jugular approach 16 cm temporary dialysis catheter with tip terminating with in the right atrium. The  catheter is ready for immediate use. PLAN: This catheter may be converted to a tunneled dialysis catheter at a later date as indicated. Ruthann Cancer, MD [facility] Electronically Signed   By: Ruthann Cancer MD   On: 04/30/2020 13:27   US BIOPSY (KIDNEY)  Result Date: 04/30/2020 INDICATION: 50 year old male with acute kidney injury. EXAM: ULTRASOUND GUIDED RENAL BIOPSY COMPARISON:  None. MEDICATIONS: None. ANESTHESIA/SEDATION: Fentanyl 50 mcg IV; Versed 2 mg IV Total Moderate Sedation time: 10 minutes; The patient was continuously monitored during the procedure by the interventional radiology nurse under my direct supervision. COMPLICATIONS: None immediate. PROCEDURE: Informed written consent was obtained from the patient after a discussion of the risks, benefits and alternatives to treatment. The patient understands and consents the procedure. A timeout was performed prior to the initiation of the procedure. Ultrasound scanning was performed of the bilateral flanks. The inferior pole of the left kidney was selected for biopsy due to location and sonographic window. The procedure was planned. The operative site was prepped and draped in the usual sterile fashion. The overlying soft tissues were anesthetized with 1% lidocaine with epinephrine. A 16 gauge core needle biopsy device was advanced into the inferior cortex of the left kidney and 2 core biopsies were obtained under direct ultrasound guidance. Images were saved for documentation purposes. The biopsy device was removed and hemostasis was obtained with manual compression. Post procedural scanning was negative for significant post procedural hemorrhage or additional complication. A dressing was placed. The patient tolerated the procedure well without immediate post procedural complication. IMPRESSION: Technically successful ultrasound guided left renal biopsy. Ruthann Cancer, MD [facility] Radiology Electronically Signed   By: Ruthann Cancer MD   On: 04/30/2020 12:47    Domenic Polite, MD  [facility] 05/01/2020

## 2020-05-01 NOTE — Progress Notes (Addendum)
Admit: 04/27/2020 LOS: 4  79M hx/o SLE off IS presenting with AKI, hypervolemia/ascites, hematuria, proteinuria  Subjective:  . HD#1 yesterday, uneventful, 1L UF . Hb 8 to 6.3 this AM, transfused already . Denies sig pain in back/flank/abdomen, no gross hematuria . UOP is anuric despite high dose furosemide . ANA + . Today is day 3/3 of solumedrol   01/04 0701 - 01/05 0700 In: 320 [IV Piggyback:320] Out: 6270 [Urine:25]  Filed Weights   04/30/20 1950 04/30/20 2155 05/01/20 0607  Weight: 84.6 kg 83.6 kg 83.1 kg    Scheduled Meds: . (feeding supplement) PROSource Plus  30 mL Oral TID BM  . sodium chloride   Intravenous Once  . Chlorhexidine Gluconate Cloth  6 each Topical Daily  . folic acid  1 mg Oral Daily  . heparin  5,000 Units Subcutaneous Q8H  . levothyroxine  50 mcg Oral Q0600  . multivitamin  1 tablet Oral QHS  . polyethylene glycol  17 g Oral Daily  . sodium chloride flush  3 mL Intravenous Q12H  . sodium zirconium cyclosilicate  10 g Oral Daily  . vitamin B-12  1,000 mcg Oral Daily   Continuous Infusions: . sodium chloride    . furosemide 160 mg (05/01/20 0547)  . methylPREDNISolone (SOLU-MEDROL) injection Stopped (04/30/20 1712)   PRN Meds:.sodium chloride, acetaminophen, bisacodyl, flavoxATE, ondansetron (ZOFRAN) IV, oxyCODONE-acetaminophen, sodium chloride flush  Current Labs: reviewed  Results for Angel, Pennington (MRN [id number]) as of 04/29/2020 13:40 Hepatitis B Surface Ag Latest Ref Range: NON REACTIVE  NON REACTIVE    Ref. Range 04/28/2020 19:21  HCV Ab Latest Ref Range: NON REACTIVE  NON REACTIVE   Results for Angel, Pennington (MRN [id number]) as of 04/29/2020 13:40 IV Screen 4th Generation wRfx  Latest Ref Range: Non Reactive  Non Reactive   Results for Angel, Pennington (MRN [id number]) as of 04/30/2020 14:03  Ref. Range 04/28/2020 19:21  ds DNA Ab Latest Ref Range: 0 - 9 IU/mL >300 (H)    C4 5, C3 47  Physical Exam:  Blood pressure (!) 133/92, pulse  84, temperature 97.7 F (36.5 C), temperature source Oral, resp. rate 15, height 5\' 11"  (1.803 m), weight 83.1 kg, SpO2 99 %. NAD in bed working on computer RRR no rub diminished in bases nl WOB 3 to 4+ LEE Less distended abdomen, soft No flank or back bruising/tenderness NCAT EOMI  A 1. AKI/RPGN with hematuria/proteinuria and hx/o #2;  1. hypocomplementemia with inc dsDNA and +ANA; neg AntiGBM; pending  ANCA studies 2. Started HD 04/30/20 3. Renal Bx 04/30/20 4. Temp HD cath 04/30/20 5. Start solumedrol 500mg  IV x3d 04/29/20-05/01/20 6. Suspect Class III or IV, +/- V LN 2. Hx/o SLE, followed by [facility], prev on imuran off sev mo, hx/o pleuritis/pericarditis 3. Ascites/Hypervolemia likely related #1 and #4; TTE w/ nL LVEF and R sided function 1. LVP 2L 1/2 and then 5L on 1/4  4. Hypoalbuminemia 5. Pancytopenia 6. Hyperkalemia, mild, resolved with HD 7. Metabolic Acidosis, NAG related to #1, resolved with HD 8. Hyperphosphatemia, trend with HD 9. Anemia, AoC, 1.7 drop in Hb overnight, ? RP or intrabdominal bleed, reassuring exam; transfusing now  P . CT Abd/Pelvis given acute change in Hb after LVP and Bx . HD#2 today if CT neg: 2L UF, 2K, 3h, no heparin  . 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 Cont solumedrol 500 IV x3d then pred 60mg /d . Await renal Bx results . Daily weights, Daily Renal  Panel, Strict I/Os, Avoid nephrotoxins (NSAIDs, judicious IV Contrast)  UPDATE: CT with L sided hematoma / RP hemorrhage at site of biopsy.  Explains ABLA.  He is ASx and hemodynmically stable. D/w Dr. Earleen Newport IR and they will follow along at this time. No immediate need for intervention.  Trend H&H. Hold on HD today, will plan for tomorrow, no heparin.   Pearson Grippe MD 05/01/2020, 9:05 AM  Recent Labs  Lab 04/29/20 0445 04/30/20 0403 05/01/20 0410  NA 133* 131*  131* 132*  K 5.4* 6.3*  6.3* 4.7  CL 112* 108  108 105  CO2 12* 12*  11* 20*  GLUCOSE 90 146*  146* 175*   BUN 85* 90*  89* 66*  CREATININE 4.83* 5.54*  5.58* 5.12*  CALCIUM 7.7* 7.8*  7.8* 7.4*  PHOS 9.3* 9.1*  10.0* 8.0*   Recent Labs  Lab 04/27/20 2016 04/28/20 0431 04/30/20 0403 05/01/20 0410  WBC 2.8* 2.6* 2.0* 2.9*  NEUTROABS 1.9 2.1 1.9  --   HGB 8.6* 8.3* 8.0* 6.3*  HCT 25.9* 25.0* 24.2* 17.6*  MCV 90.9 91.2 90.0 85.4  PLT 188 166 174 141*

## 2020-05-01 NOTE — Progress Notes (Signed)
CRITICAL VALUE ALERT  Critical Value: hgb 6.3  Date & Time Notied:  05/01/2020 0430  Provider Notified: Myna Hidalgo, MD  Orders Received/Actions taken: New orders received. Will continue to monitor.

## 2020-05-02 DIAGNOSIS — I5031 Acute diastolic (congestive) heart failure: Secondary | ICD-10-CM | POA: Diagnosis not present

## 2020-05-02 LAB — CBC
HCT: 21.6 % — ABNORMAL LOW (ref 39.0–52.0)
HCT: 22.5 % — ABNORMAL LOW (ref 39.0–52.0)
Hemoglobin: 7.6 g/dL — ABNORMAL LOW (ref 13.0–17.0)
Hemoglobin: 7.7 g/dL — ABNORMAL LOW (ref 13.0–17.0)
MCH: 30.3 pg (ref 26.0–34.0)
MCH: 29.2 pg (ref 26.0–34.0)
MCHC: 35.2 g/dL (ref 30.0–36.0)
MCHC: 34.2 g/dL (ref 30.0–36.0)
MCV: 86.1 fL (ref 80.0–100.0)
MCV: 85.2 fL (ref 80.0–100.0)
Platelets: 160 10*3/uL (ref 150–400)
Platelets: 143 10*3/uL — ABNORMAL LOW (ref 150–400)
RBC: 2.51 MIL/uL — ABNORMAL LOW (ref 4.22–5.81)
RBC: 2.64 MIL/uL — ABNORMAL LOW (ref 4.22–5.81)
RDW: 12.6 % (ref 11.5–15.5)
RDW: 12.4 % (ref 11.5–15.5)
WBC: 4.8 10*3/uL (ref 4.0–10.5)
WBC: 5 10*3/uL (ref 4.0–10.5)
nRBC: 0 % (ref 0.0–0.2)
nRBC: 0 % (ref 0.0–0.2)

## 2020-05-02 LAB — TYPE AND SCREEN
ABO/RH(D): O POS
Antibody Screen: NEGATIVE
Unit division: 0

## 2020-05-02 LAB — COMPREHENSIVE METABOLIC PANEL
ALT: 18 U/L (ref 0–44)
AST: 16 U/L (ref 15–41)
Albumin: 1.5 g/dL — ABNORMAL LOW (ref 3.5–5.0)
Alkaline Phosphatase: 61 U/L (ref 38–126)
Anion gap: 10 (ref 5–15)
BUN: 88 mg/dL — ABNORMAL HIGH (ref 6–20)
CO2: 18 mmol/L — ABNORMAL LOW (ref 22–32)
Calcium: 7.3 mg/dL — ABNORMAL LOW (ref 8.9–10.3)
Chloride: 102 mmol/L (ref 98–111)
Creatinine, Ser: 6.18 mg/dL — ABNORMAL HIGH (ref 0.61–1.24)
GFR, Estimated: 10 mL/min — ABNORMAL LOW (ref >60)
Glucose, Bld: 157 mg/dL — ABNORMAL HIGH (ref 70–99)
Potassium: 4.8 mmol/L (ref 3.5–5.1)
Sodium: 130 mmol/L — ABNORMAL LOW (ref 135–145)
Total Bilirubin: 0.5 mg/dL (ref 0.3–1.2)
Total Protein: 4.7 g/dL — ABNORMAL LOW (ref 6.5–8.1)

## 2020-05-02 LAB — IRON AND TIBC
Iron: 102 ug/dL (ref 45–182)
Saturation Ratios: 93 % — ABNORMAL HIGH (ref 17.9–39.5)
TIBC: 109 ug/dL — ABNORMAL LOW (ref 250–450)
UIBC: 7 ug/dL

## 2020-05-02 LAB — SURGICAL PATHOLOGY

## 2020-05-02 LAB — BPAM RBC
Blood Product Expiration Date: 202202082359
ISSUE DATE / TIME: 202201050809
Unit Type and Rh: 5100

## 2020-05-02 LAB — FERRITIN: Ferritin: 564 ng/mL — ABNORMAL HIGH (ref 24–336)

## 2020-05-02 MED ORDER — PREDNISONE 50 MG PO TABS
60.0000 mg | ORAL_TABLET | Freq: Every day | ORAL | Status: DC
  Administered 2020-05-03 – 2020-05-09 (×7): 60 mg via ORAL
  Filled 2020-05-02 (×7): qty 1

## 2020-05-02 MED ORDER — NEPRO/CARBSTEADY PO LIQD
237.0000 mL | ORAL | Status: DC
  Administered 2020-05-04 – 2020-05-09 (×3): 237 mL via ORAL

## 2020-05-02 MED ORDER — KIDNEY FAILURE BOOK: Freq: Once | Status: DC

## 2020-05-02 MED ORDER — HEPARIN SODIUM (PORCINE) 1000 UNIT/ML IJ SOLN
INTRAMUSCULAR | Status: AC
  Administered 2020-05-02: 2600 [IU] via INTRAVENOUS_CENTRAL
  Filled 2020-05-02: qty 3

## 2020-05-02 MED ORDER — DARBEPOETIN ALFA 60 MCG/0.3ML IJ SOSY
60.0000 ug | PREFILLED_SYRINGE | INTRAMUSCULAR | Status: DC
  Administered 2020-05-02 – 2020-05-10 (×2): 60 ug via INTRAVENOUS

## 2020-05-02 MED ORDER — DARBEPOETIN ALFA 60 MCG/0.3ML IJ SOSY
PREFILLED_SYRINGE | INTRAMUSCULAR | Status: AC
  Filled 2020-05-02: qty 0.3

## 2020-05-02 NOTE — Progress Notes (Signed)
PROGRESS NOTE  NUNO BRUBACHER BUL:[id number] DOB: 18-Oct-1970 DOA: 04/27/2020 PCP: [name], MD   LOS: 5 days   Brief narrative:  [name] is a 50 y.o. male with past medical history of lupus, chronic pancytopenia, B12 deficiency, hypothyroidism presented to the hospital with shortness of breath, abdominal distention and swelling of the legs for 3 weeks.  Patient does have history of lupus, was seen by rheumatologist 3 to 4 months ago but stopped taking Imuran by himself. In the ED, patient was to have severe abdominal distention as well as leg swelling.  CT scan of the chest and abdomen showed evidence of ascites as well as anasarca.  Work-up also showed hypoalbuminemia and significant AKI.  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, suppressed C3 and C4, weakly positive ASO, negative anti-GBM -Nephrology following -Started on high-dose IV Solu-Medrol, now prednisone 60 mg and high-dose IV Lasix -Remains profoundly volume overloaded, started hemodialysis 1/4 -Underwent kidney biopsy in IR 1/4, complicated by large retroperitoneal hematoma, await biopsy results -Also underwent paracentesis on 1/4, 5 L drained -Per nephrology  Acute blood loss anemia Large retroperitoneal hematoma -Following kidney biopsy, CT scan today shows 11 x 6 cm hematoma -1 unit of PRBC transfused 1/5, hemoglobin relatively stable, 7.5 this a.m., recheck CBC this afternoon remains hemodynamically stable  Hyponatremia.  -Secondary to hypervolemia, monitor with HD  History of lupus, pleurisy, pericardial effusion -  2D echocardiogram from 04/28/2020 showed LV ejection fraction of 55 to 60% with moderate LVH but no pericardial  effusion but trace pleural effusion.  Followed by Dr. Kathlene November in the past.  Currently on high-dose prednisone  Bladder pain likely spasms.  after Foley catheter.  Improved.  On flavoxate, oxycodone  History of Lupus. Noncompliant with medication regimen in the past.  Will need continued follow-up as outpatient with rheumatologist.  Chronic pancytopenia/ leukopenia. -Likely secondary to lupus, was seen by oncology in the past and had bone marrow biopsy.    Hypothyroidism TSH of 7.8. Continue Synthroid.  Repeat her thyroid function as outpatient.   DVT prophylaxis: SCDs Code Status: Full code Family Communication: None Status is: Inpatient  Remains inpatient appropriate because:IV treatments appropriate due to intensity of illness or inability to take PO and Inpatient level of care appropriate due to severity of illness, severe AKI with fluid overload  Dispo: The patient is from: Home              Anticipated d/c is to: Home              Anticipated d/c date is: >3days              Patient currently is not medically stable to d/c.   Consultants: Nephrology IR  Procedures:  Paracentesis  Renal biopsy plan  Antibiotics:  . None  Anti-infectives (From admission, onward)   None     Subjective: -No events overnight, seen on dialysis, dyspnea improving  Objective: Vitals:   05/02/20 1039 05/02/20 1040  BP:  129/85  Pulse:    Resp:  14  Temp: 97.8 F (36.6 C) 97.8 F (36.6 C)  SpO2:      Intake/Output Summary (Last 24 hours) at 05/02/2020 1153 Last data filed at 05/02/2020 1037 Gross per 24 hour  Intake 360 ml  Output  1342 ml  Net -982 ml   Filed Weights   05/02/20 0525 05/02/20 0730 05/02/20 1037  Weight: 84.5 kg 84.5 kg 83.1 kg   Body mass index is 25.55 kg/m.   Physical Exam:  General: Pleasant male, laying in bed, AAOx3, no distress HEENT: Right IJ HD catheter noted CVS: S1-S2, regular rate rhythm Lungs: Decreased breath sounds at both  bases Abdomen: Soft, nontender, bowel sounds present Extremities: 3+ edema  Skin: No rashes on exposed skin  Data Review: I have personally reviewed the following laboratory data and studies,  CBC: Recent Labs  Lab 04/27/20 2016 04/28/20 0431 04/30/20 0403 05/01/20 0410 05/01/20 1330 05/01/20 1620 05/01/20 2320 05/02/20 0500  WBC 2.8* 2.6* 2.0* 2.9*  --  5.2 5.0 4.8  NEUTROABS 1.9 2.1 1.9  --   --   --   --   --   HGB 8.6* 8.3* 8.0* 6.3* 7.4* 8.0* 7.6* 7.6*  HCT 25.9* 25.0* 24.2* 17.6* 21.7* 22.6* 21.2* 21.6*  MCV 90.9 91.2 90.0 85.4  --  85.6 85.8 86.1  PLT 188 166 174 141*  --  189 173 833   Basic Metabolic Panel: Recent Labs  Lab 04/28/20 0431 04/29/20 0445 04/30/20 0403 05/01/20 0410 05/02/20 0500  NA 135 133* 131*  131* 132* 130*  K 5.4* 5.4* 6.3*  6.3* 4.7 4.8  CL 113* 112* 108  108 105 102  CO2 14* 12* 12*  11* 20* 18*  GLUCOSE 114* 90 146*  146* 175* 157*  BUN 84* 85* 90*  89* 66* 88*  CREATININE 4.19* 4.83* 5.54*  5.58* 5.12* 6.18*  CALCIUM 7.6* 7.7* 7.8*  7.8* 7.4* 7.3*  MG  --   --  2.4 2.0  --   PHOS 8.8* 9.3* 9.1*  10.0* 8.0*  --    Liver Function Tests: Recent Labs  Lab 04/27/20 2016 04/28/20 0431 04/29/20 0445 04/30/20 0403 05/01/20 0410 05/02/20 0500  AST 21  --   --  $R'15 15 16  'sW$ ALT 19  --   --  $R'17 16 18  'AJ$ ALKPHOS 66  --   --  52 52 61  BILITOT 0.3  --   --  0.4 0.7 0.5  PROT 5.6*  --   --  5.5* 4.8* 4.7*  ALBUMIN 1.6* 1.9* 2.0* 1.8*  1.8* 1.5* 1.5*   No results for input(s): LIPASE, AMYLASE in the last 168 hours. No results for input(s): AMMONIA in the last 168 hours. Cardiac Enzymes: Recent Labs  Lab 04/28/20 0431  CKTOTAL 65   BNP (last 3 results) Recent Labs    04/27/20 2016  BNP 36.5    ProBNP (last 3 results) Recent Labs    11/06/19 1613  PROBNP 337.0*    CBG: Recent Labs  Lab 04/30/20 1056  GLUCAP 148*   Recent Results (from the past 240 hour(s))  Resp Panel by RT-PCR (Flu A&B, Covid)  Nasopharyngeal Swab     Status: None   Collection Time: 04/27/20  8:22 PM   Specimen: Nasopharyngeal Swab; Nasopharyngeal(NP) swabs in vial transport medium  Result Value Ref Range Status   SARS Coronavirus 2 by RT PCR NEGATIVE NEGATIVE Final    Comment: (NOTE) SARS-CoV-2 target nucleic acids are NOT DETECTED.  The SARS-CoV-2 RNA is generally detectable in upper respiratory specimens during the acute phase of infection. The lowest concentration of SARS-CoV-2 viral copies this assay can detect is 138 copies/mL. A negative result does not preclude SARS-Cov-2 infection and should not be used as the  sole basis for treatment or other patient management decisions. A negative result may occur with  improper specimen collection/handling, submission of specimen other than nasopharyngeal swab, presence of viral mutation(s) within the areas targeted by this assay, and inadequate number of viral copies(<138 copies/mL). A negative result must be combined with clinical observations, patient history, and epidemiological information. The expected result is Negative.  Fact Sheet for Patients:  BloggerCourse.com  Fact Sheet for Healthcare Providers:  SeriousBroker.it  This test is no t yet approved or cleared by the [place] FDA and  has been authorized for detection and/or diagnosis of SARS-CoV-2 by FDA under an Emergency Use Authorization (EUA). This EUA will remain  in effect (meaning this test can be used) for the duration of the COVID-19 declaration under Section 564(b)(1) of the Act, 21 U.S.C.section 360bbb-3(b)(1), unless the authorization is terminated  or revoked sooner.       Influenza A by PCR NEGATIVE NEGATIVE Final   Influenza B by PCR NEGATIVE NEGATIVE Final    Comment: (NOTE) The Xpert Xpress SARS-CoV-2/FLU/RSV plus assay is intended as an aid in the diagnosis of influenza from Nasopharyngeal swab specimens and should not be  used as a sole basis for treatment. Nasal washings and aspirates are unacceptable for Xpert Xpress SARS-CoV-2/FLU/RSV testing.  Fact Sheet for Patients: BloggerCourse.com  Fact Sheet for Healthcare Providers: SeriousBroker.it  This test is not yet approved or cleared by the [place] FDA and has been authorized for detection and/or diagnosis of SARS-CoV-2 by FDA under an Emergency Use Authorization (EUA). This EUA will remain in effect (meaning this test can be used) for the duration of the COVID-19 declaration under Section 564(b)(1) of the Act, 21 U.S.C. section 360bbb-3(b)(1), unless the authorization is terminated or revoked.  Performed at [facility], [address], [place], [place] [address]   Culture, Urine     Status: Abnormal   Collection Time: 04/28/20 11:27 AM   Specimen: Urine, Clean Catch  Result Value Ref Range Status   Specimen Description   Final    URINE, CLEAN CATCH Performed at [facility], [address] [place], [place] [address]    Special Requests   Final    NONE Performed at [facility], [address] [place], [place] [address]    Culture >=100,000 COLONIES/mL STAPHYLOCOCCUS AUREUS (A)  Final   Report Status 04/30/2020 FINAL  Final   Organism ID, Bacteria STAPHYLOCOCCUS AUREUS (A)  Final      Susceptibility   Staphylococcus aureus - MIC*    CIPROFLOXACIN <=0.5 SENSITIVE Sensitive     GENTAMICIN <=0.5 SENSITIVE Sensitive     NITROFURANTOIN <=16 SENSITIVE Sensitive     OXACILLIN <=0.25 SENSITIVE Sensitive     TETRACYCLINE <=1 SENSITIVE Sensitive     VANCOMYCIN 1 SENSITIVE Sensitive     TRIMETH/SULFA <=10 SENSITIVE Sensitive     CLINDAMYCIN <=0.25 SENSITIVE Sensitive     RIFAMPIN <=0.5 SENSITIVE Sensitive     Inducible Clindamycin NEGATIVE Sensitive     * >=100,000 COLONIES/mL STAPHYLOCOCCUS AUREUS  Culture, body fluid-bottle      Status: None (Preliminary result)   Collection Time: 04/28/20 11:30 AM   Specimen: Peritoneal Washings  Result Value Ref Range Status   Specimen Description PERITONEAL FLUID  Final   Special Requests NONE  Final   Culture   Final    NO GROWTH 4 DAYS Performed at [facility], [address], [place], [place] [address]    Report  Status PENDING  Incomplete  Gram stain     Status: None   Collection Time: 04/28/20 11:30 AM   Specimen: Peritoneal Washings  Result Value Ref Range Status   Specimen Description PERITONEAL FLUID  Final   Special Requests NONE  Final   Gram Stain   Final    WBC PRESENT,BOTH PMN AND MONONUCLEAR NO ORGANISMS SEEN CYTOSPIN SMEAR Performed at [facility], [address] [place], [place] [address]    Report Status 04/28/2020 FINAL  Final  MRSA PCR Screening     Status: None   Collection Time: 04/30/20  5:59 PM   Specimen: Nasal Mucosa; Nasopharyngeal  Result Value Ref Range Status   MRSA by PCR NEGATIVE NEGATIVE Final    Comment:        The GeneXpert MRSA Assay (FDA approved for NASAL specimens only), is one component of a comprehensive MRSA colonization surveillance program. It is not intended to diagnose MRSA infection nor to guide or monitor treatment for MRSA infections. Performed at [facility], [place] [address], [place], [place] [address]      Studies: CT ABDOMEN PELVIS WO CONTRAST  Result Date: 05/01/2020 CLINICAL DATA:  Anemia, falling hemoglobin, status post renal biopsy and paracentesis EXAM: CT ABDOMEN AND PELVIS WITHOUT CONTRAST TECHNIQUE: Multidetector CT imaging of the abdomen and pelvis was performed following the standard protocol without IV contrast. COMPARISON:  04/27/2020 FINDINGS: Lower chest: Trace bilateral pleural effusions. Hepatobiliary: No solid liver abnormality is seen. No gallstones, gallbladder wall thickening, or biliary dilatation. Pancreas: Unremarkable. No pancreatic ductal dilatation or surrounding  inflammatory changes. Spleen: Normal in size without significant abnormality. Adrenals/Urinary Tract: Adrenal glands are unremarkable. Kidneys are normal, without renal calculi, solid lesion, or hydronephrosis. Foley catheter in the bladder. Stomach/Bowel: Stomach is within normal limits. Appendix appears normal. No evidence of bowel wall thickening, distention, or inflammatory changes. Sigmoid diverticulosis. Vascular/Lymphatic: No significant vascular findings are present. No enlarged abdominal or pelvic lymph nodes. Reproductive: No mass or other significant abnormality. Other: Anasarca. Moderate volume ascites throughout the abdomen and pelvis, reduced compared to prior examination. There is a left-sided retroperitoneal hematoma, posterior to the left kidney, measuring 11.4 x 6.8 x 2.8 cm (series 3, image 44, series 7, image 141). Musculoskeletal: No acute or significant osseous findings. IMPRESSION: 1. There is a left-sided retroperitoneal hematoma, posterior to the left kidney, measuring 11.4 x 6.8 x 2.8 cm. 2. Moderate volume ascites throughout the abdomen and pelvis, reduced compared to prior examination. 3. Anasarca. These results will be called to the ordering clinician or representative by the Radiologist Assistant, and communication documented in the PACS or [employer]. Electronically Signed   By: Eddie Candle M.D.   On: 05/01/2020 12:02   US Paracentesis  Result Date: 04/30/2020 INDICATION: Patient with history of lupus, acute kidney injury, microhematuria, proteinuria, lower extremity edema, recurrent ascites; request received for therapeutic paracentesis prior to random renal biopsy. EXAM: ULTRASOUND GUIDED THERAPEUTIC PARACENTESIS MEDICATIONS: 1% lidocaine to skin and subcutaneous tissue COMPLICATIONS: None immediate. PROCEDURE: Informed written consent was obtained from the patient after a discussion of the risks, benefits and alternatives to treatment. A timeout was performed prior to the  initiation of the procedure. Initial ultrasound scanning demonstrates a large amount of ascites within the right lower abdominal quadrant. The right lower abdomen was prepped and draped in the usual sterile fashion. 1% lidocaine was used for local anesthesia. Following this, a 19 gauge, 10-cm, Yueh catheter was introduced. An ultrasound image was saved for  documentation purposes. The paracentesis was performed. The catheter was removed and a dressing was applied. The patient tolerated the procedure well without immediate post procedural complication. FINDINGS: A total of approximately 5 liters of yellow fluid was removed. IMPRESSION: Successful ultrasound-guided therapeutic paracentesis yielding 5 liters of peritoneal fluid. Read by: Rowe Robert, PA-C Electronically Signed   By: Ruthann Cancer MD   On: 04/30/2020 12:44   IR Fluoro Guide CV Line Right  Result Date: 04/30/2020 INDICATION: 50 year old male with history of acute kidney injury on chronic kidney disease requiring central venous access for hemodialysis. EXAM: NON-TUNNELED CENTRAL VENOUS HEMODIALYSIS CATHETER PLACEMENT WITH ULTRASOUND AND FLUOROSCOPIC GUIDANCE COMPARISON:  None. MEDICATIONS: None. Moderate (conscious) sedation was employed during this procedure. A total of Versed 0 mg and Fentanyl 50 mcg was administered intravenously. Moderate Sedation Time: 11 minutes. The patient's level of consciousness and vital signs were monitored continuously by radiology nursing throughout the procedure under my direct supervision. FLUOROSCOPY TIME:  0 minutes, 6 seconds (1 mGy) COMPLICATIONS: None immediate. PROCEDURE: Informed written consent was obtained from the patient after a discussion of the risks, benefits, and alternatives to treatment. Questions regarding the procedure were encouraged and answered. The right neck and chest were prepped with chlorhexidine in a sterile fashion, and a sterile drape was applied covering the operative field. Maximum  barrier sterile technique with sterile gowns and gloves were used for the procedure. A timeout was performed prior to the initiation of the procedure. After the overlying soft tissues were anesthetized, a small venotomy incision was created and a micropuncture kit was utilized to access the internal jugular vein. Real-time ultrasound guidance was utilized for vascular access including the acquisition of a permanent ultrasound image documenting patency of the accessed vessel. The microwire was utilized to measure appropriate catheter length. A stiff glidewire was advanced to the level of the IVC. Under fluoroscopic guidance, the venotomy was serially dilated, ultimately allowing placement of a 16 cm temporary Trialysis catheter with tip ultimately terminating within the superior aspect of the right atrium. Final catheter positioning was confirmed and documented with a spot radiographic image. The catheter aspirates and flushes normally. The catheter was flushed with appropriate volume heparin dwells. The catheter exit site was secured with a 0-Prolene retention suture. A dressing was placed. The patient tolerated the procedure well without immediate post procedural complication. IMPRESSION: Successful placement of a right internal jugular approach 16 cm temporary dialysis catheter with tip terminating with in the right atrium. The catheter is ready for immediate use. PLAN: This catheter may be converted to a tunneled dialysis catheter at a later date as indicated. Ruthann Cancer, MD [facility] Radiology Electronically Signed   By: Ruthann Cancer MD   On: 04/30/2020 13:27   IR US Guide Vasc Access Right  Result Date: 04/30/2020 INDICATION: 50 year old male with history of acute kidney injury on chronic kidney disease requiring central venous access for hemodialysis. EXAM: NON-TUNNELED CENTRAL VENOUS HEMODIALYSIS CATHETER PLACEMENT WITH ULTRASOUND AND FLUOROSCOPIC  GUIDANCE COMPARISON:  None. MEDICATIONS: None. Moderate (conscious) sedation was employed during this procedure. A total of Versed 0 mg and Fentanyl 50 mcg was administered intravenously. Moderate Sedation Time: 11 minutes. The patient's level of consciousness and vital signs were monitored continuously by radiology nursing throughout the procedure under my direct supervision. FLUOROSCOPY TIME:  0 minutes, 6 seconds (1 mGy) COMPLICATIONS: None immediate. PROCEDURE: Informed written consent was obtained from the patient after a discussion of the risks, benefits, and alternatives to treatment.  Questions regarding the procedure were encouraged and answered. The right neck and chest were prepped with chlorhexidine in a sterile fashion, and a sterile drape was applied covering the operative field. Maximum barrier sterile technique with sterile gowns and gloves were used for the procedure. A timeout was performed prior to the initiation of the procedure. After the overlying soft tissues were anesthetized, a small venotomy incision was created and a micropuncture kit was utilized to access the internal jugular vein. Real-time ultrasound guidance was utilized for vascular access including the acquisition of a permanent ultrasound image documenting patency of the accessed vessel. The microwire was utilized to measure appropriate catheter length. A stiff glidewire was advanced to the level of the IVC. Under fluoroscopic guidance, the venotomy was serially dilated, ultimately allowing placement of a 16 cm temporary Trialysis catheter with tip ultimately terminating within the superior aspect of the right atrium. Final catheter positioning was confirmed and documented with a spot radiographic image. The catheter aspirates and flushes normally. The catheter was flushed with appropriate volume heparin dwells. The catheter exit site was secured with a 0-Prolene retention suture. A dressing was placed. The patient tolerated the  procedure well without immediate post procedural complication. IMPRESSION: Successful placement of a right internal jugular approach 16 cm temporary dialysis catheter with tip terminating with in the right atrium. The catheter is ready for immediate use. PLAN: This catheter may be converted to a tunneled dialysis catheter at a later date as indicated. Ruthann Cancer, MD [facility] Radiology Electronically Signed   By: Ruthann Cancer MD   On: 04/30/2020 13:27   US BIOPSY (KIDNEY)  Result Date: 04/30/2020 INDICATION: 50 year old male with acute kidney injury. EXAM: ULTRASOUND GUIDED RENAL BIOPSY COMPARISON:  None. MEDICATIONS: None. ANESTHESIA/SEDATION: Fentanyl 50 mcg IV; Versed 2 mg IV Total Moderate Sedation time: 10 minutes; The patient was continuously monitored during the procedure by the interventional radiology nurse under my direct supervision. COMPLICATIONS: None immediate. PROCEDURE: Informed written consent was obtained from the patient after a discussion of the risks, benefits and alternatives to treatment. The patient understands and consents the procedure. A timeout was performed prior to the initiation of the procedure. Ultrasound scanning was performed of the bilateral flanks. The inferior pole of the left kidney was selected for biopsy due to location and sonographic window. The procedure was planned. The operative site was prepped and draped in the usual sterile fashion. The overlying soft tissues were anesthetized with 1% lidocaine with epinephrine. A 16 gauge core needle biopsy device was advanced into the inferior cortex of the left kidney and 2 core biopsies were obtained under direct ultrasound guidance. Images were saved for documentation purposes. The biopsy device was removed and hemostasis was obtained with manual compression. Post procedural scanning was negative for significant post procedural hemorrhage or additional complication. A  dressing was placed. The patient tolerated the procedure well without immediate post procedural complication. IMPRESSION: Technically successful ultrasound guided left renal biopsy. Ruthann Cancer, MD [facility] Radiology Electronically Signed   By: Ruthann Cancer MD   On: 04/30/2020 12:47    Domenic Polite, MD  [facility] 05/02/2020

## 2020-05-02 NOTE — Plan of Care (Signed)
  Problem: Education: Goal: Knowledge of General Education information will improve Description: Including pain rating scale, medication(s)/side effects and non-pharmacologic comfort measures Outcome: Progressing   Problem: Activity: Goal: Risk for activity intolerance will decrease Outcome: Progressing   Problem: Nutrition: Goal: Adequate nutrition will be maintained Outcome: Progressing   Problem: Elimination: Goal: Will not experience complications related to bowel motility Outcome: Progressing Goal: Will not experience complications related to urinary retention Outcome: Progressing   Problem: Safety: Goal: Ability to remain free from injury will improve Outcome: Progressing

## 2020-05-02 NOTE — Progress Notes (Signed)
Hgb 7.6, Dr. Clearence Ped text paged.  Will continue to monitor pt.

## 2020-05-02 NOTE — Progress Notes (Signed)
Round on patient today in correlation to new start HD secondary to AKI. Patient found sitting up in bed with pleasant affect, agreeable to education. Ordered consult to dietician and Kidney Failure book. Patient educated at the bedside regarding care of current temp HD catheter and after placement of possible tunneled dialysis catheter.  Patient also educated on the importance of adhering to scheduled dialysis treatments, the effects of fluid overload, hyperkalemia and hyperphosphatemia. Patient capable of re-verbalizing via teach back method. Also educated patient on services available through the interdisciplinary team in the clinic setting and the differences they will note when transitioning to the outpatient setting from the hospital.   Mr. Nolden notes that he lives at home alone with his 41 year old son who is present every other week. He reports that he would possibly need assistance with transportation. Patient educated that the Renal Navigator will be reaching out to assist him with placement and will also be able to assist him with transportation information. Patient with no further questions at this time. Handouts and contact information provided to patient for any further assistance. Will follow as appropriate.   Dorthey Sawyer, RN  Dialysis Nurse Coordinator Phone: [phone]

## 2020-05-02 NOTE — Progress Notes (Signed)
Admit: 04/27/2020 LOS: 5  44M hx/o SLE off IS presenting with AKI, hypervolemia/ascites, hematuria, proteinuria  Subjective:  . Stable Hb overnight around 8.  VSS . No sig UOP . Denies pain in back/flank . Completed 3d of solumedrol . ANCA neg   01/05 0701 - 01/06 0700 In: 986.6 [P.O.:600; Blood:386.6] Out: 225 [Urine:225]  Filed Weights   05/01/20 0607 05/02/20 0525 05/02/20 0730  Weight: 83.1 kg 84.5 kg 84.5 kg    Scheduled Meds: . (feeding supplement) PROSource Plus  30 mL Oral TID BM  . sodium chloride   Intravenous Once  . Chlorhexidine Gluconate Cloth  6 each Topical Daily  . folic acid  1 mg Oral Daily  . levothyroxine  50 mcg Oral Q0600  . multivitamin  1 tablet Oral QHS  . polyethylene glycol  17 g Oral Daily  . predniSONE  60 mg Oral Q breakfast  . sodium chloride flush  3 mL Intravenous Q12H  . sodium zirconium cyclosilicate  10 g Oral Daily  . vitamin B-12  1,000 mcg Oral Daily   Continuous Infusions: . sodium chloride     PRN Meds:.sodium chloride, acetaminophen, bisacodyl, flavoxATE, ondansetron (ZOFRAN) IV, oxyCODONE-acetaminophen, sodium chloride flush  Current Labs: reviewed  Results for JOAS, MOTTON (MRN [id number]) as of 04/29/2020 13:40 Hepatitis B Surface Ag Latest Ref Range: NON REACTIVE  NON REACTIVE    Ref. Range 04/28/2020 19:21  HCV Ab Latest Ref Range: NON REACTIVE  NON REACTIVE   Results for MANCEL, LARDIZABAL (MRN [id number]) as of 04/29/2020 13:40 IV Screen 4th Generation wRfx  Latest Ref Range: Non Reactive  Non Reactive   Results for DORA, CLAUSS (MRN [id number]) as of 04/30/2020 14:03  Ref. Range 04/28/2020 19:21  ds DNA Ab Latest Ref Range: 0 - 9 IU/mL >300 (H)    C4 5, C3 47  Physical Exam:  Blood pressure (!) 126/94, pulse 80, temperature 98.4 F (36.9 C), temperature source Oral, resp. rate 17, height 5\' 11"  (1.803 m), weight 84.5 kg, SpO2 97 %. NAD in bed  RRR no rub diminished in bases nl WOB 3 LEE Less distended abdomen,  soft No flank or back bruising/tenderness NCAT EOMI Temp HD Cath  A 1. AKI/RPGN with hematuria/proteinuria and hx/o #2;  1. hypocomplementemia with inc dsDNA and +ANA; neg AntiGBM; ANCA neg 2. Started HD 04/30/20 3. Renal Bx 04/30/20 c/b perinephric hematoma and ABLA, see #9 4. Temp HD cath 04/30/20 5. Start solumedrol 500mg  IV x3d 04/29/20-05/01/20 --> Pred 60mg /d for now 6. Suspect Class III or IV, +/- V LN; await prelim Bx 7. Start outpt AKI CLIP anticipating prolonged HD dependence 2. Hx/o SLE, followed by [facility], prev on imuran off sev mo, hx/o pleuritis/pericarditis 3. Ascites/Hypervolemia likely related #1 and #4; TTE w/ nL LVEF and R sided function 1. LVP 2L 1/2 and then 5L on 1/4  2. Hopefully can manage with HD 4. Hypoalbuminemia 5. Pancytopenia 6. Hyperkalemia, mild, resolved with HD 7. Metabolic Acidosis, NAG related to #1, improved with HD 8. Hyperphosphatemia, trend with HD 9. Anemia, AoC, 1.7 drop in Hb in 24h post bx, imaging with hematoma, stabilized and hemodynamics stable; IR following.   P . HD#2 today . Stop Lokelma . Start pred 60mg /d . 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 Check Fe levels, start ESA today . Await renal Bx results . Daily weights, Daily Renal Panel, Strict I/Os, Avoid nephrotoxins (NSAIDs, judicious IV Contrast)  Pearson Grippe MD 05/02/2020, 8:56 AM  Recent Labs  Lab 04/29/20 0445 04/30/20 0403 05/01/20 0410 05/02/20 0500  NA 133* 131*  131* 132* 130*  K 5.4* 6.3*  6.3* 4.7 4.8  CL 112* 108  108 105 102  CO2 12* 12*  11* 20* 18*  GLUCOSE 90 146*  146* 175* 157*  BUN 85* 90*  89* 66* 88*  CREATININE 4.83* 5.54*  5.58* 5.12* 6.18*  CALCIUM 7.7* 7.8*  7.8* 7.4* 7.3*  PHOS 9.3* 9.1*  10.0* 8.0*  --    Recent Labs  Lab 04/27/20 2016 04/28/20 0431 04/30/20 0403 05/01/20 0410 05/01/20 1620 05/01/20 2320 05/02/20 0500  WBC 2.8* 2.6* 2.0*   < > 5.2 5.0 4.8  NEUTROABS 1.9 2.1 1.9  --   --   --   --   HGB 8.6*  8.3* 8.0*   < > 8.0* 7.6* 7.6*  HCT 25.9* 25.0* 24.2*   < > 22.6* 21.2* 21.6*  MCV 90.9 91.2 90.0   < > 85.6 85.8 86.1  PLT 188 166 174   < > 189 173 160   < > = values in this interval not displayed.

## 2020-05-02 NOTE — Progress Notes (Signed)
Nutrition Follow-up  RD working remotely.  DOCUMENTATION CODES:   Non-severe (moderate) malnutrition in context of chronic illness  INTERVENTION:   - Recommend increasing bowel regimen as pt has not had a BM since 04/27/20  - If PO intake does not improve, recommend liberalizing diet to Regular or 2 gram sodium to promote PO intake  - Continue ProSource Plus 30 ml TID, each supplement provides 100 kcal and 15 grams of protein  - Add Nepro Shake po daily, each supplement provides 425 kcal and 19 grams protein  - Continue renal MVI daily  - RD will monitor for ability to provide renal diet education on follow-up  NUTRITION DIAGNOSIS:   Moderate Malnutrition related to chronic illness (lupus) as evidenced by mild fat depletion,mild muscle depletion,moderate fat depletion,moderate muscle depletion.  Ongoing  GOAL:   Patient will meet greater than or equal to 90% of their needs  Progressing  MONITOR:   PO intake,Supplement acceptance,Labs,Weight trends,I & O's  REASON FOR ASSESSMENT:   Malnutrition Screening Tool,Consult Assessment of nutrition requirement/status  ASSESSMENT:   50 y.o. male with medical history of lupus (not on taking any medications), chronic pancytopenia, vitamin B12 deficiency, and hypothyroidism. He presented to the ED with SOB, abdominal distention, and BLE swelling x3 weeks. In the ED, CT chest and abdomen showed ascites and anasarca. Workup revealed hypoalbuminemia and significant AKI.  01/04 - s/p kidney biopsy, first iHD 01/05 - CT showing large retroperitoneal hematoma  RD consulted by MD for assessment and poor PO intake.  Pt currently in HD for second HD treatment. Unable to speak with pt via phone call.  Pt on a renal diet with 1200 ml fluid restriction. Meal completions have been recorded as 0-75%. If PO intake does not begin improving, recommend liberalizing diet to Regular or 2 gram sodium to provide additional food choices and promote  PO intake.  Weight down from 90.2 kg on 1/02 to 84.5 kg on 1/06. Suspect weight loss related to fluid loss as pt is s/p 2 paracentesis (7 L total fluid removed) and has just started iHD with second treatment today. Pt with deep pitting edema to BLE per RN edema assessment this morning.  Per [facility] documentation, pt has been accepting ProSource when offered 100% of the time. RD to add Nepro Shake daily to increase kcal and protein intake.  Noted pt has been without a BM since 04/27/20 per flowsheets. Recommend increasing bowel regimen as constipation is likely contributing to nausea and poor appetite.  Medications reviewed and include: ProSource Plus 30 ml po TID, aranesp, folic acid, rena-vit, miralax, prednisone, vitamin B-12  Labs reviewed: sodium 130, hemoglobin 7.6  UOP: 225 ml x 24 hours I/O's: +1.1 L since admit  Diet Order:   Diet Order            Diet renal with fluid restriction Fluid restriction: 1200 mL Fluid; Room service appropriate? Yes; Fluid consistency: Thin  Diet effective now                 EDUCATION NEEDS:   Education needs have been addressed  Skin:  Skin Assessment: Reviewed RN Assessment  Last BM:  04/27/20  Height:   Ht Readings from Last 1 Encounters:  04/29/20 5\' 11"  (1.803 m)    Weight:   Wt Readings from Last 1 Encounters:  05/02/20 84.5 kg    BMI:  Body mass index is 25.98 kg/m.  Estimated Nutritional Needs:   Kcal:  2100-2300 kcal  Protein:  100-115 grams  Fluid:  >/= 1 L/day    Gustavus Bryant, MS, RD, LDN Inpatient Clinical Dietitian Please see AMiON for contact information.

## 2020-05-03 ENCOUNTER — Encounter (HOSPITAL_COMMUNITY): Payer: Self-pay | Admitting: Internal Medicine

## 2020-05-03 ENCOUNTER — Inpatient Hospital Stay (HOSPITAL_COMMUNITY): Payer: BC Managed Care – PPO

## 2020-05-03 DIAGNOSIS — I5031 Acute diastolic (congestive) heart failure: Secondary | ICD-10-CM | POA: Diagnosis not present

## 2020-05-03 HISTORY — PX: IR US GUIDE VASC ACCESS RIGHT: IMG2390

## 2020-05-03 HISTORY — PX: IR PERC TUN PERIT CATH WO PORT S&I /IMAG: IMG2327

## 2020-05-03 LAB — COMPREHENSIVE METABOLIC PANEL
ALT: 20 U/L (ref 0–44)
AST: 18 U/L (ref 15–41)
Albumin: 1.5 g/dL — ABNORMAL LOW (ref 3.5–5.0)
Alkaline Phosphatase: 64 U/L (ref 38–126)
Anion gap: 7 (ref 5–15)
BUN: 63 mg/dL — ABNORMAL HIGH (ref 6–20)
CO2: 23 mmol/L (ref 22–32)
Calcium: 7.2 mg/dL — ABNORMAL LOW (ref 8.9–10.3)
Chloride: 103 mmol/L (ref 98–111)
Creatinine, Ser: 4.88 mg/dL — ABNORMAL HIGH (ref 0.61–1.24)
GFR, Estimated: 14 mL/min — ABNORMAL LOW (ref >60)
Glucose, Bld: 135 mg/dL — ABNORMAL HIGH (ref 70–99)
Potassium: 3.9 mmol/L (ref 3.5–5.1)
Sodium: 133 mmol/L — ABNORMAL LOW (ref 135–145)
Total Bilirubin: 0.4 mg/dL (ref 0.3–1.2)
Total Protein: 4.6 g/dL — ABNORMAL LOW (ref 6.5–8.1)

## 2020-05-03 LAB — CBC
HCT: 22.9 % — ABNORMAL LOW (ref 39.0–52.0)
Hemoglobin: 7.8 g/dL — ABNORMAL LOW (ref 13.0–17.0)
MCH: 29.5 pg (ref 26.0–34.0)
MCHC: 34.1 g/dL (ref 30.0–36.0)
MCV: 86.7 fL (ref 80.0–100.0)
Platelets: 149 10*3/uL — ABNORMAL LOW (ref 150–400)
RBC: 2.64 MIL/uL — ABNORMAL LOW (ref 4.22–5.81)
RDW: 12.5 % (ref 11.5–15.5)
WBC: 4.8 10*3/uL (ref 4.0–10.5)
nRBC: 0 % (ref 0.0–0.2)

## 2020-05-03 MED ORDER — FENTANYL CITRATE (PF) 100 MCG/2ML IJ SOLN
INTRAMUSCULAR | Status: AC | PRN
  Administered 2020-05-03 (×2): 50 ug via INTRAVENOUS

## 2020-05-03 MED ORDER — GELATIN ABSORBABLE 12-7 MM EX MISC
CUTANEOUS | Status: AC
  Filled 2020-05-03: qty 1

## 2020-05-03 MED ORDER — MIDAZOLAM HCL 2 MG/2ML IJ SOLN
INTRAMUSCULAR | Status: AC | PRN
Start: 2020-05-03 — End: 2020-05-03
  Administered 2020-05-03 (×2): 1 mg via INTRAVENOUS

## 2020-05-03 MED ORDER — MIDAZOLAM HCL 2 MG/2ML IJ SOLN
INTRAMUSCULAR | Status: AC
  Filled 2020-05-03: qty 2

## 2020-05-03 MED ORDER — SULFAMETHOXAZOLE-TRIMETHOPRIM 800-160 MG PO TABS
1.0000 | ORAL_TABLET | ORAL | Status: DC
  Administered 2020-05-03 – 2020-05-10 (×4): 1 via ORAL
  Filled 2020-05-03 (×4): qty 1

## 2020-05-03 MED ORDER — CEFAZOLIN SODIUM-DEXTROSE 2-4 GM/100ML-% IV SOLN: 2.0000 g | Freq: Once | INTRAVENOUS | Status: DC

## 2020-05-03 MED ORDER — LIDOCAINE HCL 1 % IJ SOLN
INTRAMUSCULAR | Status: AC
  Filled 2020-05-03: qty 20

## 2020-05-03 MED ORDER — LIDOCAINE HCL 1 % IJ SOLN
INTRAMUSCULAR | Status: AC | PRN
  Administered 2020-05-03: 10 mL

## 2020-05-03 MED ORDER — HEPARIN SODIUM (PORCINE) 1000 UNIT/ML IJ SOLN
INTRAMUSCULAR | Status: AC
  Filled 2020-05-03: qty 1

## 2020-05-03 MED ORDER — CEFAZOLIN SODIUM-DEXTROSE 2-4 GM/100ML-% IV SOLN
2.0000 g | INTRAVENOUS | Status: AC
  Administered 2020-05-03: 2 g via INTRAVENOUS
  Filled 2020-05-03: qty 100

## 2020-05-03 MED ORDER — FENTANYL CITRATE (PF) 100 MCG/2ML IJ SOLN
INTRAMUSCULAR | Status: AC
  Filled 2020-05-03: qty 2

## 2020-05-03 NOTE — Progress Notes (Signed)
PT Cancellation Note  Patient Details Name: [name] MRN: [id number] DOB: August 19, 1970   Cancelled Treatment:    Reason Eval/Treat Not Completed: (P) Patient at procedure or test/unavailable Pt leaving floor to go to IR. PT will follow back for treatment next week.   [name] B. [name] PT, DPT Acute Rehabilitation Services Pager [phone] Office [phone]    [place] 05/03/2020, 3:15 PM

## 2020-05-03 NOTE — Procedures (Signed)
Interventional Radiology Procedure Note  Procedure: rt IJ TUNNELED HD CATH    Complications: None  Estimated Blood Loss:  MIN  Findings: TIP SVCRA    Tamera Punt, MD

## 2020-05-03 NOTE — Progress Notes (Signed)
PROGRESS NOTE  [name]:[id number] DOB: 03-18-1971 DOA: 04/27/2020 PCP: [name], MD   LOS: 6 days   Brief narrative:  [name] is a 50 y.o. male with past medical history of lupus, chronic pancytopenia, B12 deficiency, hypothyroidism presented to the hospital with shortness of breath, abdominal distention and swelling of the legs for 3 weeks.  Patient does have history of lupus, was seen by rheumatologist 3 to 4 months ago but stopped taking Imuran by himself. In the ED, patient was to have severe abdominal distention as well as leg swelling.  CT scan of the chest and abdomen showed evidence of ascites as well as anasarca.  Work-up also showed hypoalbuminemia and significant AKI.  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, dsDNA>300, suppressed C3 and C4, weakly positive ASO, negative anti-GBM -Nephrology following -Started on high-dose IV Solu-Medrol, now prednisone 60 mg and high-dose IV Lasix -Remains profoundly volume overloaded, started hemodialysis 1/4 -Underwent kidney biopsy in IR 1/4, complicated by large retroperitoneal hematoma, results pending -Also underwent paracentesis on on 1/2 with 2 L drained followed by 1/4, 5 L drained -Per nephrology  Acute blood loss anemia Large retroperitoneal hematoma -Following kidney biopsy, CT scan today shows 11 x 6 cm hematoma -1 unit of PRBC transfused 1/5, hemoglobin relatively stable, 7.8 this a.m. -Check CBC in a.m.  Hyponatremia.  -Secondary to hypervolemia, monitor with HD  History of lupus, pleurisy, pericardial effusion -  2D echocardiogram from 04/28/2020 showed LV ejection fraction of 55 to 60% with moderate LVH but no pericardial effusion  but trace pleural effusion.  Followed by Dr. Kathlene November in the past.  Currently on high-dose prednisone  Bladder pain likely spasms.   -after Foley catheter placement.  Improved.  On flavoxate, oxycodone  History of Lupus. Noncompliant with medication regimen in the past.  Will need continued follow-up as outpatient with rheumatologist.  Chronic pancytopenia/ leukopenia. -Likely secondary to lupus, was seen by oncology in the past and had bone marrow biopsy.    Hypothyroidism TSH of 7.8. Continue Synthroid.  Repeat her thyroid function as outpatient.   DVT prophylaxis: SCDs due to large retroperitoneal hematoma Code Status: Full code Family Communication: None Status is: Inpatient  Remains inpatient appropriate because:IV treatments appropriate due to intensity of illness or inability to take PO and Inpatient level of care appropriate due to severity of illness, severe AKI with fluid overload  Dispo: The patient is from: Home              Anticipated d/c is to: Home              Anticipated d/c date is: >3days              Patient currently is not medically stable to d/c.   Consultants: Nephrology IR  Procedures:  Paracentesis 1/2 and 1/4  Renal biopsy 1/4  Antibiotics:  . None  Anti-infectives (From admission, onward)   Start     Dose/Rate Route Frequency Ordered Stop   05/03/20 1015  sulfamethoxazole-trimethoprim (BACTRIM DS) 800-160 MG per tablet 1 tablet        1 tablet Oral Once per day on Mon Wed Fri 05/03/20 2458       Subjective: -No events overnight, seen on dialysis, dyspnea improving  Objective: Vitals:  05/03/20 0317 05/03/20 0837  BP: 122/78 112/76  Pulse: 90 85  Resp: 16 13  Temp: 98.2 F (36.8 C) 98 F (36.7 C)  SpO2: 98% 98%    Intake/Output Summary (Last 24 hours) at 05/03/2020 1035 Last data filed at 05/03/2020 0800 Gross per 24 hour  Intake 516 ml  Output 1267 ml  Net -751 ml   Filed Weights   05/02/20 0730 05/02/20 1037 05/03/20 0529   Weight: 84.5 kg 83.1 kg 86.6 kg   Body mass index is 26.63 kg/m.   Physical Exam:  General: Pleasant male, laying in bed, AAOx3, no distress HEENT: Right IJ HD catheter noted CVS: S1-S2, regular rate rhythm Lungs: Decreased breath sounds the bases Abdomen: Soft, nontender, bowel sounds present, abdominal wall edema Extremities: 2-3+ edema  Skin: No rashes on exposed skin  Data Review: I have personally reviewed the following laboratory data and studies,  CBC: Recent Labs  Lab 04/27/20 2016 04/28/20 0431 04/30/20 0403 05/01/20 0410 05/01/20 1620 05/01/20 2320 05/02/20 0500 05/02/20 1320 05/03/20 0327  WBC 2.8* 2.6* 2.0*   < > 5.2 5.0 4.8 5.0 4.8  NEUTROABS 1.9 2.1 1.9  --   --   --   --   --   --   HGB 8.6* 8.3* 8.0*   < > 8.0* 7.6* 7.6* 7.7* 7.8*  HCT 25.9* 25.0* 24.2*   < > 22.6* 21.2* 21.6* 22.5* 22.9*  MCV 90.9 91.2 90.0   < > 85.6 85.8 86.1 85.2 86.7  PLT 188 166 174   < > 189 173 160 143* 149*   < > = values in this interval not displayed.   Basic Metabolic Panel: Recent Labs  Lab 04/28/20 0431 04/29/20 0445 04/30/20 0403 05/01/20 0410 05/02/20 0500 05/03/20 0327  NA 135 133* 131*  131* 132* 130* 133*  K 5.4* 5.4* 6.3*  6.3* 4.7 4.8 3.9  CL 113* 112* 108  108 105 102 103  CO2 14* 12* 12*  11* 20* 18* 23  GLUCOSE 114* 90 146*  146* 175* 157* 135*  BUN 84* 85* 90*  89* 66* 88* 63*  CREATININE 4.19* 4.83* 5.54*  5.58* 5.12* 6.18* 4.88*  CALCIUM 7.6* 7.7* 7.8*  7.8* 7.4* 7.3* 7.2*  MG  --   --  2.4 2.0  --   --   PHOS 8.8* 9.3* 9.1*  10.0* 8.0*  --   --    Liver Function Tests: Recent Labs  Lab 04/27/20 2016 04/28/20 0431 04/29/20 0445 04/30/20 0403 05/01/20 0410 05/02/20 0500 05/03/20 0327  AST 21  --   --  $R'15 15 16 18  'XA$ ALT 19  --   --  $R'17 16 18 20  'vG$ ALKPHOS 66  --   --  52 52 61 64  BILITOT 0.3  --   --  0.4 0.7 0.5 0.4  PROT 5.6*  --   --  5.5* 4.8* 4.7* 4.6*  ALBUMIN 1.6*   < > 2.0* 1.8*  1.8* 1.5* 1.5* 1.5*   < > = values in  this interval not displayed.   No results for input(s): LIPASE, AMYLASE in the last 168 hours. No results for input(s): AMMONIA in the last 168 hours. Cardiac Enzymes: Recent Labs  Lab 04/28/20 0431  CKTOTAL 65   BNP (last 3 results) Recent Labs    04/27/20 2016  BNP 36.5    ProBNP (last 3 results) Recent Labs    11/06/19 1613  PROBNP 337.0*    CBG: Recent  Labs  Lab 04/30/20 1056  GLUCAP 148*   Recent Results (from the past 240 hour(s))  Resp Panel by RT-PCR (Flu A&B, Covid) Nasopharyngeal Swab     Status: None   Collection Time: 04/27/20  8:22 PM   Specimen: Nasopharyngeal Swab; Nasopharyngeal(NP) swabs in vial transport medium  Result Value Ref Range Status   SARS Coronavirus 2 by RT PCR NEGATIVE NEGATIVE Final    Comment: (NOTE) SARS-CoV-2 target nucleic acids are NOT DETECTED.  The SARS-CoV-2 RNA is generally detectable in upper respiratory specimens during the acute phase of infection. The lowest concentration of SARS-CoV-2 viral copies this assay can detect is 138 copies/mL. A negative result does not preclude SARS-Cov-2 infection and should not be used as the sole basis for treatment or other patient management decisions. A negative result may occur with  improper specimen collection/handling, submission of specimen other than nasopharyngeal swab, presence of viral mutation(s) within the areas targeted by this assay, and inadequate number of viral copies(<138 copies/mL). A negative result must be combined with clinical observations, patient history, and epidemiological information. The expected result is Negative.  Fact Sheet for Patients:  EntrepreneurPulse.com.au  Fact Sheet for Healthcare Providers:  IncredibleEmployment.be  This test is no t yet approved or cleared by the [place] FDA and  has been authorized for detection and/or diagnosis of SARS-CoV-2 by FDA under an Emergency Use Authorization (EUA).  This EUA will remain  in effect (meaning this test can be used) for the duration of the COVID-19 declaration under Section 564(b)(1) of the Act, 21 U.S.C.section 360bbb-3(b)(1), unless the authorization is terminated  or revoked sooner.       Influenza A by PCR NEGATIVE NEGATIVE Final   Influenza B by PCR NEGATIVE NEGATIVE Final    Comment: (NOTE) The Xpert Xpress SARS-CoV-2/FLU/RSV plus assay is intended as an aid in the diagnosis of influenza from Nasopharyngeal swab specimens and should not be used as a sole basis for treatment. Nasal washings and aspirates are unacceptable for Xpert Xpress SARS-CoV-2/FLU/RSV testing.  Fact Sheet for Patients: EntrepreneurPulse.com.au  Fact Sheet for Healthcare Providers: IncredibleEmployment.be  This test is not yet approved or cleared by the [place] FDA and has been authorized for detection and/or diagnosis of SARS-CoV-2 by FDA under an Emergency Use Authorization (EUA). This EUA will remain in effect (meaning this test can be used) for the duration of the COVID-19 declaration under Section 564(b)(1) of the Act, 21 U.S.C. section 360bbb-3(b)(1), unless the authorization is terminated or revoked.  Performed at [facility], [place] [address] [place], [place] [address]   Culture, Urine     Status: Abnormal   Collection Time: 04/28/20 11:27 AM   Specimen: Urine, Clean Catch  Result Value Ref Range Status   Specimen Description   Final    URINE, CLEAN CATCH Performed at [facility], [place] [address] [place], [place] [address]    Special Requests   Final    NONE Performed at [facility], [place] [address], [place],  [address]    Culture >=100,000 COLONIES/mL STAPHYLOCOCCUS AUREUS (A)  Final   Report Status 04/30/2020 FINAL  Final   Organism ID, Bacteria STAPHYLOCOCCUS AUREUS (A)  Final      Susceptibility   Staphylococcus aureus -  MIC*    CIPROFLOXACIN <=0.5 SENSITIVE Sensitive     GENTAMICIN <=0.5 SENSITIVE Sensitive     NITROFURANTOIN <=16 SENSITIVE Sensitive     OXACILLIN <=0.25 SENSITIVE Sensitive     TETRACYCLINE <=  1 SENSITIVE Sensitive     VANCOMYCIN 1 SENSITIVE Sensitive     TRIMETH/SULFA <=10 SENSITIVE Sensitive     CLINDAMYCIN <=0.25 SENSITIVE Sensitive     RIFAMPIN <=0.5 SENSITIVE Sensitive     Inducible Clindamycin NEGATIVE Sensitive     * >=100,000 COLONIES/mL STAPHYLOCOCCUS AUREUS  Culture, body fluid-bottle     Status: None (Preliminary result)   Collection Time: 04/28/20 11:30 AM   Specimen: Peritoneal Washings  Result Value Ref Range Status   Specimen Description PERITONEAL FLUID  Final   Special Requests NONE  Final   Culture   Final    NO GROWTH 4 DAYS Performed at [facility], [address], [place], [place] [address]    Report Status PENDING  Incomplete  Gram stain     Status: None   Collection Time: 04/28/20 11:30 AM   Specimen: Peritoneal Washings  Result Value Ref Range Status   Specimen Description PERITONEAL FLUID  Final   Special Requests NONE  Final   Gram Stain   Final    WBC PRESENT,BOTH PMN AND MONONUCLEAR NO ORGANISMS SEEN CYTOSPIN SMEAR Performed at [facility], [address] [place], [place] [address]    Report Status 04/28/2020 FINAL  Final  MRSA PCR Screening     Status: None   Collection Time: 04/30/20  5:59 PM   Specimen: Nasal Mucosa; Nasopharyngeal  Result Value Ref Range Status   MRSA by PCR NEGATIVE NEGATIVE Final    Comment:        The GeneXpert MRSA Assay (FDA approved for NASAL specimens only), is one component of a comprehensive MRSA colonization surveillance program. It is not intended to diagnose MRSA infection nor to guide or monitor treatment for MRSA infections. Performed at [facility], [address], [place], [place] [address]      Studies: CT ABDOMEN PELVIS WO CONTRAST  Result Date: 05/01/2020 CLINICAL DATA:   Anemia, falling hemoglobin, status post renal biopsy and paracentesis EXAM: CT ABDOMEN AND PELVIS WITHOUT CONTRAST TECHNIQUE: Multidetector CT imaging of the abdomen and pelvis was performed following the standard protocol without IV contrast. COMPARISON:  04/27/2020 FINDINGS: Lower chest: Trace bilateral pleural effusions. Hepatobiliary: No solid liver abnormality is seen. No gallstones, gallbladder wall thickening, or biliary dilatation. Pancreas: Unremarkable. No pancreatic ductal dilatation or surrounding inflammatory changes. Spleen: Normal in size without significant abnormality. Adrenals/Urinary Tract: Adrenal glands are unremarkable. Kidneys are normal, without renal calculi, solid lesion, or hydronephrosis. Foley catheter in the bladder. Stomach/Bowel: Stomach is within normal limits. Appendix appears normal. No evidence of bowel wall thickening, distention, or inflammatory changes. Sigmoid diverticulosis. Vascular/Lymphatic: No significant vascular findings are present. No enlarged abdominal or pelvic lymph nodes. Reproductive: No mass or other significant abnormality. Other: Anasarca. Moderate volume ascites throughout the abdomen and pelvis, reduced compared to prior examination. There is a left-sided retroperitoneal hematoma, posterior to the left kidney, measuring 11.4 x 6.8 x 2.8 cm (series 3, image 44, series 7, image 141). Musculoskeletal: No acute or significant osseous findings. IMPRESSION: 1. There is a left-sided retroperitoneal hematoma, posterior to the left kidney, measuring 11.4 x 6.8 x 2.8 cm. 2. Moderate volume ascites throughout the abdomen and pelvis, reduced compared to prior examination. 3. Anasarca. These results will be called to the ordering clinician or representative by the Radiologist Assistant, and communication documented in the PACS or [employer]. Electronically Signed   By: Lauralyn Primes M.D.   On: 05/01/2020 12:02    Zannie Cove, MD  [facility] 05/03/2020

## 2020-05-03 NOTE — TOC Progression Note (Signed)
Transition of Care [facility]) - Progression Note    Patient Details  Name: [name] MRN: [id number] Date of Birth: 01-06-1971  Transition of Care [facility]) CM/SW Contact  Zenon Mayo, RN Phone Number: 05/03/2020, 10:41 PM  Clinical Narrative:    From home, HD patient, for tunneled cath today for IHD.  TOC team will follow for dc needs.        Expected Discharge Plan and Services                                                 Social Determinants of Health (SDOH) Interventions    Readmission Risk Interventions No flowsheet data found.

## 2020-05-03 NOTE — Progress Notes (Signed)
Brief Nutrition Note  RD consulted for diet education: "New start HD, hope for acute HD need only." RD is currently following pt during acute admission. Pt with moderate malnutrition and interventions are in place. Please see most recent follow-up note from 05/02/20.  RD working remotely. Attempted to reach pt via phone call to room x 2; however, no answer. RD will attach "Phosphorus Content of Foods," "Potassium Content of Foods," and "Low Sodium Nutrition Therapy" handouts from the Academy of Nutrition and Dietetics to pt's AVS/Discharge Instructions.  Will follow up with pt next week to go over handouts and provide in-person diet education. RD will continue to follow pt during admission.   Gustavus Bryant, MS, RD, LDN Inpatient Clinical Dietitian Please see AMiON for contact information.

## 2020-05-03 NOTE — Plan of Care (Signed)

## 2020-05-03 NOTE — Progress Notes (Signed)
Admit: 04/27/2020 LOS: 6  37M hx/o SLE off IS presenting with AKI, hypervolemia/ascites, hematuria, proteinuria  Subjective:  . Stable Hb this AM is 7.8.  VSS . Sister at bedside, updated by phone yesterday as well . No sig UOP, still has Foley . HD yesterday: 1.1L UF . Path tissue misdirected to [facility], not [facility]; Local path is working to get ot [facility]   01/06 0701 - 01/07 0700 In: 280 [P.O.:240; I.V.:40] Out: 1267 [Urine:150]  Filed Weights   05/02/20 0730 05/02/20 1037 05/03/20 0529  Weight: 84.5 kg 83.1 kg 86.6 kg    Scheduled Meds: . (feeding supplement) PROSource Plus  30 mL Oral TID BM  . sodium chloride   Intravenous Once  . Chlorhexidine Gluconate Cloth  6 each Topical Daily  . darbepoetin (ARANESP) injection - DIALYSIS  60 mcg Intravenous Q Thu-HD  . feeding supplement (NEPRO CARB STEADY)  237 mL Oral Q24H  . folic acid  1 mg Oral Daily  . kidney failure book   Does not apply Once  . levothyroxine  50 mcg Oral Q0600  . multivitamin  1 tablet Oral QHS  . polyethylene glycol  17 g Oral Daily  . predniSONE  60 mg Oral Q breakfast  . sodium chloride flush  3 mL Intravenous Q12H  . vitamin B-12  1,000 mcg Oral Daily   Continuous Infusions: . sodium chloride     PRN Meds:.sodium chloride, acetaminophen, bisacodyl, flavoxATE, ondansetron (ZOFRAN) IV, oxyCODONE-acetaminophen, sodium chloride flush  Current Labs: reviewed  Results for KASEAN, DENHERDER (MRN [id number]) as of 04/29/2020 13:40 Hepatitis B Surface Ag Latest Ref Range: NON REACTIVE  NON REACTIVE    Ref. Range 04/28/2020 19:21  HCV Ab Latest Ref Range: NON REACTIVE  NON REACTIVE   Results for MARRELL, DICAPRIO (MRN [id number]) as of 04/29/2020 13:40 IV Screen 4th Generation wRfx  Latest Ref Range: Non Reactive  Non Reactive   Results for JC, VERON (MRN [id number]) as of 04/30/2020 14:03  Ref. Range 04/28/2020 19:21  ds DNA Ab Latest Ref Range: 0 - 9 IU/mL >300 (H)    C4 5, C3 47  Results for ZAIDAN, KEEBLE (MRN [id number]) as of 05/03/2020 09:26  Ref. Range 05/02/2020 13:05  Saturation Ratios Latest Ref Range: 17.9 - 39.5 % 93 (H)  Ferritin Latest Ref Range: 24 - 336 ng/mL 564 (H)    Physical Exam:  Blood pressure 112/76, pulse 85, temperature 98 F (36.7 C), temperature source Oral, resp. rate 13, height 5\' 11"  (1.803 m), weight 86.6 kg, SpO2 98 %. NAD in bed  RRR no rub diminished in bases nl WOB 3 LEE Less distended abdomen, soft No flank or back bruising/tenderness NCAT EOMI Temp HD Cath  A 1. AKI/RPGN with hematuria/proteinuria and hx/o #2;  1. hypocomplementemia with inc dsDNA and +ANA; neg AntiGBM; ANCA neg 2. Started HD 04/30/20 3. Renal Bx 04/30/20 c/b perinephric hematoma and ABLA, see #9 4. Temp HD cath 04/30/20 5. Start solumedrol 500mg  IV x3d 04/29/20-05/01/20 --> Pred 60mg /d for now 6. Suspect Class III or IV, +/- V LN; await prelim Bx but was not sent to [facility] for nephropathology, local path group working on this 7. Start outpt AKI CLIP anticipating prolonged HD dependence 2. Hx/o SLE, followed by [facility], prev on imuran off sev mo, hx/o pleuritis/pericarditis 3. Ascites/Hypervolemia likely related #1 and #4; TTE w/ nL LVEF and R sided function 1. LVP 2L 1/2 and then 5L on 1/4  2. Hopefully can manage  with HD 4. Hypoalbuminemia 5. Pancytopenia 6. Hyperkalemia, mild, resolved with HD 7. Metabolic Acidosis, NAG related to #1, improved with HD 8. Hyperphosphatemia, trend with HD 9. Anemia, AoC, 1.7 drop in Hb in 24h post bx, imaging with hematoma, stabilized and hemodynamics stable; IR following.  On ESA; High TSAT no role for IV Fe  P . HD#3 tomorrow: 2-3L UF, 400/600, 2K, no heparin, 3.5h . Cont pred 60mg /d . Anticipate using MMF as well, await further info on path . Start TMP/SMX DS TIW fpr PCP ppx . DC foley . Consult IR for tunneled HD cath . 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 Daily weights, Daily Renal Panel, Strict I/Os, Avoid nephrotoxins (NSAIDs,  judicious IV Contrast)  Pearson Grippe MD 05/03/2020, 9:26 AM  Recent Labs  Lab 04/29/20 0445 04/30/20 0403 05/01/20 0410 05/02/20 0500 05/03/20 0327  NA 133* 131*  131* 132* 130* 133*  K 5.4* 6.3*  6.3* 4.7 4.8 3.9  CL 112* 108  108 105 102 103  CO2 12* 12*  11* 20* 18* 23  GLUCOSE 90 146*  146* 175* 157* 135*  BUN 85* 90*  89* 66* 88* 63*  CREATININE 4.83* 5.54*  5.58* 5.12* 6.18* 4.88*  CALCIUM 7.7* 7.8*  7.8* 7.4* 7.3* 7.2*  PHOS 9.3* 9.1*  10.0* 8.0*  --   --    Recent Labs  Lab 04/27/20 2016 04/28/20 0431 04/30/20 0403 05/01/20 0410 05/02/20 0500 05/02/20 1320 05/03/20 0327  WBC 2.8* 2.6* 2.0*   < > 4.8 5.0 4.8  NEUTROABS 1.9 2.1 1.9  --   --   --   --   HGB 8.6* 8.3* 8.0*   < > 7.6* 7.7* 7.8*  HCT 25.9* 25.0* 24.2*   < > 21.6* 22.5* 22.9*  MCV 90.9 91.2 90.0   < > 86.1 85.2 86.7  PLT 188 166 174   < > 160 143* 149*   < > = values in this interval not displayed.

## 2020-05-03 NOTE — Discharge Instructions (Signed)
Phosphorus Content of Foods  Phosphorus is an important mineral that your body uses for energy and overall health. What you eat and drink can affect the amount of phosphorus in your body. The key to selecting foods with phosphorus is having the right balance for your needs.  Natural and Added Phosphorus   Natural Phosphorus: Phosphorus occurs naturally in meats, dairy, grains, and vegetables. Your body absorbs about half of this natural phosphorus from foods and drinks.  Added Phosphorus: Phosphorus is also added to many foods and drinks as a preservative. Your body absorbs nearly all of the added phosphorus from foods and drinks.  How Much Phosphorus is in Food and Drinks?  Nutrition Facts labels don't typically include phosphorus amounts and they don't identify whether the phosphorus in the product is natural or added.  Read the ingredients list to check if a product label displays "phos" in the ingredients. This abbreviation will indicate for sure that phosphorus has been added.  Ingredients with phosphorus that are most commonly added to food include disodium phosphate, sodium hexametaphosphate, phosphoric acid, calcium phosphate, and dipotassium phosphate.  Foods and drinks with the highest added phosphorus are usually processed foods, packaged foods, and fast foods.  Phosphorus in Foods  Lower Phosphorus Choices Higher Phosphorus Choices  Grains and Baked Goods Fresh breads, buns, dinner rolls, bagels English muffins, or pitas without "phos" ingredients Plain cereals such as oatmeal, corn flakes, rice crispies Reduced-salt crackers, rice cakes, pretzels, popcorn, or tortilla chips without "phos" ingredients Processed breads and cereals with phosphorus additives on food label Biscuits, brownies, cakes, muffins, pancakes, pastries, or waffles that are ready-to-eat or made from a dry mix with "phos" on the label Refrigerated or frozen dough for biscuits, cookies, pastries, or sweet rolls  with "phos" ingredients  Protein Foods All-natural chicken, [place], fish or seafood Lean and fresh beef, lamb, pork, veal, or wild game (3 ounces is size of palm of hand) Whole eggs or egg whites (1 egg is 1 ounce) Tofu, beans, lentils, hummus (1/4-1/3 cup) Unsalted nuts (1/4 cup) or nut butters (1 tablespoon) Processed meats like bacon, ham, hot dogs, chicken nuggets or strips, bologna, salami, or sausage Breaded or fried meats, chicken, fish or seafood Organ meats such as kidney or liver  Dairy and Dairy Alternatives Unfortified dairy alternates such as almond or rice beverages Milk or soy beverage (1/2 cup) Cottage cheese with no "phos" ingredients (1/2 cup) Yogurt (6 ounces) all natural, unsweetened, or plain preferred Natural cheese such as brie, feta, Swiss, cheddar, or mozzarella. Only have a small amount (1 ounce - size of your thumb or 2 dice) Regular or low-fat cream cheese, [place], or sour cream (1 tbsp) Sherbet, sorbet, fruit ice, or popsicles (1/2 cup) Non-dairy creamers and some half and half creamers with "phos" Enriched dairy alternates such as almond, oat or rice beverages Processed cheese, such as American Processed cheese spreads and dips Fat free cream cheese or sour cream Ice cream, pudding or frozen yogurt Milk-based or cheese-based soups or sauces  Vegetables Fresh, frozen, or canned without added "phos" ingredients Vegetables with sauces added Frozen or packaged potatoes or vegetables with "phos" ingredients  Fruits  Fresh, frozen or canned without added "phos" ingredients Fresh, frozen or canned with added "phos" ingredients  Beverages Water Fresh brewed coffee or tea Fresh lemonade or pure fruit juice (1/2 cup) Beverages without "phos" ingredients Beverage or powdered mix with "phos" ingredients: most colas, energy and sports drinks, canned or bottled coffees and teas, flavored waters and  drink mixes. Beers and wines  Other (Fast, convenience or  restaurant foods) Hamburgers, fish filet (no cheese), plain chicken wings Grilled, roasted, broiled, baked fish, chicken, [place], fish or seafood. Plain prepared eggs (no cheese, ham, bacon, sausage), [place] toast, English muffin, bagel, hot cereal, toast Pizza without meat or extra cheese Tacos, burritos, enchiladas fajitas with limited toppings. Choose white rice, lettuce, sauted onions, bell peppers on the side Tuna or egg salad sandwich (no cheese) Sides: salad without cheese, coleslaw, apple slices, applesauce, grapes, or carrots Meals with no "phos" ingredients and have less than 600 milligrams of sodium per serving Battered or fried fish or chicken including nuggets, sandwiches, strips or wings Pizza with meat and extra cheese Tacos, burritos, enchiladas with meat and toppings such as cheese and beans Hot dogs and sausages Any sandwich made with ham, processed deli meats, American cheese, or bacon [place] fries or other fried potatoes or battered vegetables, biscuits, or macaroni and cheese Meals or soups with "phos" ingredients and more than 600 millligrams of sodium per serving        Potassium Content of Foods  You can eat healthy foods with potassium every day. Your body needs this mineral to work properly. It helps your nerves to function and muscles to contract and keeps your heartbeat regular. High levels of potassium in the blood can cause irregular heartbeats and can be life threatening. A low blood level can cause severe muscle weakness and cramps.  The key to selecting foods with potassium is having the right balance for your needs. Your registered dietitian nutritionist (RDN) can help create a daily plan that's right for you.  Tips: - Check portion sizes closely. For example, a serving of dried raisins is  cup and a serving of fresh grapes is  cup. - The Nutrition Facts label presents the amount of potassium per serving. - Products labeled "low salt" or "low sodium"  may have more potassium since potassium is used as a salt substitute. - Check the ingredient list for sources of potassium: Potassium chloride, potassium sorbate, tetrapotassium phosphate, dipotassium phosphate.  Potassium in Foods  A low-potassium food can become a high potassium food if you eat a large amount.  A high-potassium food can be considered a low potassium food if you only eat a small amount of it.   Lower Potassium Choices Less than 200 milligrams per serving Higher Potassium Choices 200 milligrams or more per serving  Serving size Serving size of food is  cup, 1 small fruit, or 1 cup leafy green unless otherwise noted. Potassium levels may change if a food is fresh, cooked, or canned  Vegetables Asparagus Beans (green, wax, yellow) Bean sprouts Broccoli Cabbage (red or green) Carrots Cauliflower Celery Corn Cucumbers Eggplant Greens (kale, mustard greens, endive, watercress) Peppers (green, red, orange) Kale Lettuce (all types) Leeks Mushrooms Onions Peas (green, sugar snap, snow peas) Radishes (5) Summer squash Turnip Artichokes (1) Avocado Bamboo shoots (raw) Beets (canned, fresh) Brussels sprouts (fresh, frozen) Chard (cooked) Chinese cabbage/bok choy (cooked) Corn (1 ear) Kohlrabi Potatoes (white, sweet, yams) Parsnips Pumpkin [place]aga Spinach (cooked, canned, frozen) Squash (winter, acorn, butternut, rhubarb) Tomatoes (raw, boiled, canned, purees, sauces) Vegetable or tomato juice   Fruits Apple, applesauce Apricot (1) Berries: blackberries blueberries cranberries, raspberries, strawberries Cherries Clementine (1), mandarin orange, tangerine Dried apples, blueberries, cherries, cranberries ( cup) Fruit cup: any fruit, fruit cocktail Grapes Grapefruit () Lemon and limes Plum (1) Pear Watermelon (1 cup) Fruit juice: apple, cranberry, grape, pineapple Nectars: apricot, mango,  papaya, peach, pear Bananas Dried fruit (such as  apricots, dates, figs, prunes, raisins, currants)  cup Kiwi (1) Melon: Cantaloupe, honeydew Nectarine (1 medium) Orange (fresh, 1 medium) Papaya (1/2 medium) Peach Plantain Pomegranate Fruit juice: pomegranate, prune, orange, carrot   Grains Cereal, whole grain and plain (1cup) Oatmeal (1 cup)  Bread, whole grain, raisin, white (1 slice) Pasta (whole grain and white) Rice (white or brown) Bran cereals Granola  Protein Foods Peanut butter (1 tablespoon) Hummus ( cup) Tofu (firm, soft, silken) Vegetarian patty (2 ounces, size of  palm of hand) Legumes/Pulses: Black, kidney, pinto or white beans, black-eyed peas, split peas, lentils, chickpeas, garbanzos Beef, fish, and poultry (3 ounces, size of palm of hand) Nuts ( cup) Soy: soy nuts , tofu (extra firm or lite)  Dairy and Alternatives Rice, almond, or oat dairy alternates Cream cheese (1 tablespoon) Natural cheese (blue, brie, cheddar, Swiss) (1 ounce) Sour cream (1 tablespoon) Milk (8 ounces) Yogurt (fruit flavors, 8 ounces) Soy milk Cottage cheese (1 cup) Coconut milk  Beverages Lemonade (8 ounces) Tea (8 ounces, fresh brewed)  Coconut water Low-sodium broths and soups (1 cup,  can,1 packet, bouillon cube) Bottled/instant tea       Low Sodium Nutrition Therapy  Eating less sodium can help you if you have high blood pressure, heart failure, or kidney or liver disease.   Your body needs a little sodium, but too much sodium can cause your body to hold onto extra water. This extra water will raise your blood pressure and can cause damage to your heart, kidneys, or liver as they are forced to work harder.   Sometimes you can see how the extra fluid affects you because your hands, legs, or belly swell. You may also hold water around your heart and lungs, which makes it hard to breathe.   Even if you take medication for blood pressure or a water pill (diuretic) to remove fluid, it is still important to have  less salt in your diet.   Check with your primary care provider before drinking alcohol since it may affect the amount of fluid in your body and how your heart, kidneys, or liver work.  Sodium in Food A low-sodium meal plan limits the sodium that you get from food and beverages to 1,500-2,000 milligrams (mg) per day. Salt is the main source of sodium. Read the nutrition label on the package to find out how much sodium is in one serving of a food.  . Select foods with 140 milligrams (mg) of sodium or less per serving.  . You may be able to eat one or two servings of foods with a little more than 140 milligrams (mg) of sodium if you are closely watching how much sodium you eat in a day.  . Check the serving size on the label. The amount of sodium listed on the label shows the amount in one serving of the food. So, if you eat more than one serving, you will get more sodium than the amount listed.  Tips:  Cutting Back on Sodium . Eat more fresh foods.  . Fresh fruits and vegetables are low in sodium, as well as frozen vegetables and fruits that have no added juices or sauces.  . Fresh meats are lower in sodium than processed meats, such as bacon, sausage, and hotdogs.  . Not all processed foods are unhealthy, but some processed foods may have too much sodium.  . Eat less salt at the table and when cooking. One  of the ingredients in salt is sodium.  . One teaspoon of table salt has 2,300 milligrams of sodium.  . Leave the salt out of recipes for pasta, casseroles, and soups. . Be a smart shopper.  . Food packages that say "Salt-free", sodium-free", "very low sodium," and "low sodium" have less than 140 milligrams of sodium per serving.  . Beware of products identified as "Unsalted," "No Salt Added," "Reduced Sodium," or "Lower Sodium." These items may still be high in sodium. You should always check the nutrition label. . Add flavors to your food without adding sodium.  . Try lemon juice, lime  juice, or vinegar.  . Dry or fresh herbs add flavor.  Sharyn Lull a sodium-free seasoning blend or make your own at home. . You can purchase salt-free or sodium-free condiments like barbeque sauce in stores and online. Ask your registered dietitian nutritionist for recommendations and where to find them.   Eating in Restaurants Choose foods carefully when you eat outside your home. Restaurant foods can be very high in sodium. Many restaurants provide nutrition facts on their menus or their websites. If you cannot find that information, ask your server. Let your server know that you want your food to be cooked without salt and that you would like your salad dressing and sauces to be served on the side.    Foods Recommended Food Group Foods Recommended  Grains Bread, bagels, rolls without salted tops Homemade bread made with reduced-sodium baking powder Cold cereals, especially shredded wheat and puffed rice Oats, grits, or cream of wheat Pastas, quinoa, and rice Popcorn, pretzels or crackers without salt Corn tortillas  Protein Foods Fresh meats and fish; [place] bacon (check the nutrition labels - make sure they are not packaged in a sodium solution) Canned or packed tuna (no more than 4 ounces at 1 serving) Beans and peas Soybeans) and tofu Eggs Nuts or nut butters without salt  Dairy Milk or milk powder Plant milks, such as rice and soy Yogurt, including Greek yogurt Small amounts of natural cheese (blocks of cheese) or reduced-sodium cheese can be used in moderation. (Swiss, ricotta, and fresh mozzarella cheese are lower in sodium than the others) Cream Cheese Low sodium cottage cheese  Vegetables Fresh and frozen vegetables without added sauces or salt Homemade soups (without salt) Low-sodium, salt-free or sodium-free canned vegetables and soups  Fruit Fresh and canned fruits Dried fruits, such as raisins, cranberries, and prunes  Oils Tub or liquid margarine, regular or without  salt Canola, corn, peanut, olive, safflower, or sunflower oils  Condiments Fresh or dried herbs such as basil, bay leaf, dill, mustard (dry), nutmeg, paprika, parsley, rosemary, sage, or thyme.  Low sodium ketchup Vinegar  Lemon or lime juice Pepper, red pepper flakes, and cayenne. Hot sauce contains sodium, but if you use just a drop or two, it will not add up to much.  Salt-free or sodium-free seasoning mixes and marinades Simple salad dressings: vinegar and oil   Foods Not Recommended Food Group Foods Not Recommended  Grains Breads or crackers topped with salt Cereals (hot/cold) with more than 300 mg sodium per serving Biscuits, cornbread, and other "quick" breads prepared with baking soda Pre-packaged bread crumbs Seasoned and packaged rice and pasta mixes Self-rising flours  Protein Foods Cured meats: Bacon, ham, sausage, pepperoni and hot dogs Canned meats (chili, vienna sausage, or sardines) Smoked fish and meats Frozen meals that have more than 600 mg of sodium per serving Egg substitute (with added sodium)  Dairy  Buttermilk Processed cheese spreads Cottage cheese (1 cup may have over 500 mg of sodium; look for low-sodium.) American or feta cheese Shredded Cheese has more sodium than blocks of cheese String cheese  Vegetables Canned vegetables (unless they are salt-free, sodium-free or low sodium) Frozen vegetables with seasoning and sauces Sauerkraut and pickled vegetables Canned or dried soups (unless they are salt-free, sodium-free, or low sodium) [place] fries and onion rings  Fruit Dried fruits preserved with additives that have sodium  Oils Salted butter or margarine, all types of olives  Condiments Salt, sea salt, kosher salt, onion salt, and garlic salt Seasoning mixes with salt Bouillon cubes Ketchup Barbeque sauce and Worcestershire sauce unless low sodium Soy sauce Salsa, pickles, olives, relish Salad dressings: ranch, blue cheese, [place], and [place].    Low Sodium Sample 1-Day Menu  Breakfast . 1 cup cooked oatmeal  . 1 slice whole wheat bread toast  . 1 tablespoon peanut butter without salt  . 1 banana  . 1 cup 1% milk  Lunch . Tacos made with: 2 corn tortillas  .  cup black beans, low sodium  .  cup roasted or grilled chicken (without skin)  .  avocado  . Squeeze of lime juice  . 1 cup salad greens  . 1 tablespoon low-sodium salad dressing  .  cup strawberries  . 1 orange  Afternoon Snack . 1/3 cup grapes  . 6 ounces yogurt  Evening Meal . 3 ounces herb-baked fish  . 1 baked potato  . 2 teaspoons olive oil  .  cup cooked carrots  . 2 thick slices tomatoes on:  . 2 lettuce leaves  . 1 teaspoon olive oil  . 1 teaspoon balsamic vinegar  . 1 cup 1% milk  Evening Snack . 1 apple  .  cup almonds without salt   Low-Sodium Vegetarian (Lacto-Ovo) Sample 1-Day Menu  Breakfast . 1 cup cooked oatmeal  . 1 slice whole wheat toast  . 1 tablespoon peanut butter without salt  . 1 banana  . 1 cup 1% milk  Lunch . Tacos made with: 2 corn tortillas  .  cup black beans, low sodium  .  cup roasted or grilled chicken (without skin)  .  avocado  . Squeeze of lime juice  . 1 cup salad greens  . 1 tablespoon low-sodium salad dressing  .  cup strawberries  . 1 orange  Evening Meal . Stir fry made with:  cup tofu  . 1 cup brown rice  .  cup broccoli  .  cup green beans  .  cup peppers  .  tablespoon peanut oil  . 1 orange  . 1 cup 1% milk  Evening Snack . 4 strips celery  . 2 tablespoons hummus  . 1 hard-boiled egg   Low-Sodium Vegan Sample 1-Day Menu  Breakfast . 1 cup cooked oatmeal  . 1 tablespoon peanut butter without salt  . 1 cup blueberries  . 1 cup soymilk fortified with calcium, vitamin B12, and vitamin D  Lunch . 1 small whole wheat pita  .  cup cooked lentils  . 2 tablespoons hummus  . 4 carrot sticks  . 1 medium apple  . 1 cup soymilk fortified with calcium, vitamin B12, and vitamin D   Evening Meal . Stir fry made with:  cup tofu  . 1 cup brown rice  .  cup broccoli  .  cup green beans  .  cup peppers  .  tablespoon peanut oil  .  1 cup cantaloupe  Evening Snack . 1 cup soy yogurt  .  cup mixed nuts   Copyright 2020  [employer]. All rights reserved

## 2020-05-03 NOTE — Progress Notes (Signed)
Referring Physician(s): Dr. Joelyn Oms  Supervising Physician: Daryll Brod  Patient Status:  [facility] - In-pt  Chief Complaint: Renal failure  Subjective: Known to IR from recent kidney biopsy 10/30/15 complicated by retroperitoneal hematoma.  Patient hemoglobin has remained stable at 7.8 though he does complain of expected back soreness.  Patient also s/p temp cath placement 04/30/20 for acute renal failure.  He has not had significant renal recovery and long-term HD is expected.  IR now consulted for tunneled dialysis catheter placement.  Patient has been NPO since 730AM.    Allergies: Patient has no known allergies.  Medications: Prior to Admission medications   Medication Sig Start Date End Date Taking? Authorizing Provider  folic acid (FOLVITE) 1 MG tablet Take 1 tablet by mouth once daily 01/16/20  Yes [name], MD  furosemide (LASIX) 40 MG tablet TAKE 1 TABLET BY MOUTH IN THE MORNING AND  1/2  (ONE-HALF)  TABLET  BY  MOUTH  IN  THE  LATE  AFTERNOON 04/09/20  Yes [name], MD  ibuprofen (ADVIL,MOTRIN) 200 MG tablet Take 200 mg by mouth every 6 (six) hours as needed (pain).   Yes [provider]  levothyroxine (SYNTHROID) 50 MCG tablet TAKE 1 TABLET BY MOUTH ONCE DAILY BEFORE BREAKFAST 01/16/20  Yes [name], MD  lisinopril (ZESTRIL) 20 MG tablet Take 1 tablet (20 mg total) by mouth daily. 11/27/19  Yes [name], MD  potassium chloride SA (KLOR-CON) 20 MEQ tablet Take 1 tablet (20 mEq total) by mouth daily. 11/06/19  Yes [name], MD  vitamin B-12 (CYANOCOBALAMIN) 1000 MCG tablet Take 1,000 mcg by mouth daily. 04/09/20  Yes [provider]  Vitamin D, Ergocalciferol, (DRISDOL) 1.25 MG (50000 UNIT) CAPS capsule Take 1 capsule (50,000 Units total) by mouth every 7 (seven) days. 11/12/19  Yes [name], MD  Cyanocobalamin (B-12) 1000 MCG CAPS Take one daily Patient not taking: Reported on  04/27/2020 11/27/19   [name], MD     Vital Signs: BP 112/76 (BP Location: Right Arm)   Pulse 85   Temp 98 F (36.7 C) (Oral)   Resp 13   Ht 5' 11" (1.803 m)   Wt 190 lb 14.7 oz (86.6 kg)   SpO2 98%   BMI 26.63 kg/m   Physical Exam  NAD, alert Neck: R IJ temp cath in place without issue.  Heart: RRR Chest: CTAB  Imaging: CT ABDOMEN PELVIS WO CONTRAST  Result Date: 05/01/2020 CLINICAL DATA:  Anemia, falling hemoglobin, status post renal biopsy and paracentesis EXAM: CT ABDOMEN AND PELVIS WITHOUT CONTRAST TECHNIQUE: Multidetector CT imaging of the abdomen and pelvis was performed following the standard protocol without IV contrast. COMPARISON:  04/27/2020 FINDINGS: Lower chest: Trace bilateral pleural effusions. Hepatobiliary: No solid liver abnormality is seen. No gallstones, gallbladder wall thickening, or biliary dilatation. Pancreas: Unremarkable. No pancreatic ductal dilatation or surrounding inflammatory changes. Spleen: Normal in size without significant abnormality. Adrenals/Urinary Tract: Adrenal glands are unremarkable. Kidneys are normal, without renal calculi, solid lesion, or hydronephrosis. Foley catheter in the bladder. Stomach/Bowel: Stomach is within normal limits. Appendix appears normal. No evidence of bowel wall thickening, distention, or inflammatory changes. Sigmoid diverticulosis. Vascular/Lymphatic: No significant vascular findings are present. No enlarged abdominal or pelvic lymph nodes. Reproductive: No mass or other significant abnormality. Other: Anasarca. Moderate volume ascites throughout the abdomen and pelvis, reduced compared to prior examination. There is a left-sided retroperitoneal hematoma, posterior to the left kidney, measuring 11.4  x 6.8 x 2.8 cm (series 3, image 44, series 7, image 141). Musculoskeletal: No acute or significant osseous findings. IMPRESSION: 1. There is a left-sided retroperitoneal hematoma, posterior to the left kidney,  measuring 11.4 x 6.8 x 2.8 cm. 2. Moderate volume ascites throughout the abdomen and pelvis, reduced compared to prior examination. 3. Anasarca. These results will be called to the ordering clinician or representative by the Radiologist Assistant, and communication documented in the PACS or [employer]. Electronically Signed   By: Eddie Candle M.D.   On: 05/01/2020 12:02   US Paracentesis  Result Date: 04/30/2020 INDICATION: Patient with history of lupus, acute kidney injury, microhematuria, proteinuria, lower extremity edema, recurrent ascites; request received for therapeutic paracentesis prior to random renal biopsy. EXAM: ULTRASOUND GUIDED THERAPEUTIC PARACENTESIS MEDICATIONS: 1% lidocaine to skin and subcutaneous tissue COMPLICATIONS: None immediate. PROCEDURE: Informed written consent was obtained from the patient after a discussion of the risks, benefits and alternatives to treatment. A timeout was performed prior to the initiation of the procedure. Initial ultrasound scanning demonstrates a large amount of ascites within the right lower abdominal quadrant. The right lower abdomen was prepped and draped in the usual sterile fashion. 1% lidocaine was used for local anesthesia. Following this, a 19 gauge, 10-cm, Yueh catheter was introduced. An ultrasound image was saved for documentation purposes. The paracentesis was performed. The catheter was removed and a dressing was applied. The patient tolerated the procedure well without immediate post procedural complication. FINDINGS: A total of approximately 5 liters of yellow fluid was removed. IMPRESSION: Successful ultrasound-guided therapeutic paracentesis yielding 5 liters of peritoneal fluid. Read by: Rowe Robert, PA-C Electronically Signed   By: Ruthann Cancer MD   On: 04/30/2020 12:44   IR Fluoro Guide CV Line Right  Result Date: 04/30/2020 INDICATION: 50 year old male with history of acute kidney injury on chronic kidney disease requiring central  venous access for hemodialysis. EXAM: NON-TUNNELED CENTRAL VENOUS HEMODIALYSIS CATHETER PLACEMENT WITH ULTRASOUND AND FLUOROSCOPIC GUIDANCE COMPARISON:  None. MEDICATIONS: None. Moderate (conscious) sedation was employed during this procedure. A total of Versed 0 mg and Fentanyl 50 mcg was administered intravenously. Moderate Sedation Time: 11 minutes. The patient's level of consciousness and vital signs were monitored continuously by radiology nursing throughout the procedure under my direct supervision. FLUOROSCOPY TIME:  0 minutes, 6 seconds (1 mGy) COMPLICATIONS: None immediate. PROCEDURE: Informed written consent was obtained from the patient after a discussion of the risks, benefits, and alternatives to treatment. Questions regarding the procedure were encouraged and answered. The right neck and chest were prepped with chlorhexidine in a sterile fashion, and a sterile drape was applied covering the operative field. Maximum barrier sterile technique with sterile gowns and gloves were used for the procedure. A timeout was performed prior to the initiation of the procedure. After the overlying soft tissues were anesthetized, a small venotomy incision was created and a micropuncture kit was utilized to access the internal jugular vein. Real-time ultrasound guidance was utilized for vascular access including the acquisition of a permanent ultrasound image documenting patency of the accessed vessel. The microwire was utilized to measure appropriate catheter length. A stiff glidewire was advanced to the level of the IVC. Under fluoroscopic guidance, the venotomy was serially dilated, ultimately allowing placement of a 16 cm temporary Trialysis catheter with tip ultimately terminating within the superior aspect of the right atrium. Final catheter positioning was confirmed and documented with a spot radiographic image. The catheter aspirates and flushes normally. The catheter was flushed with appropriate  volume heparin  dwells. The catheter exit site was secured with a 0-Prolene retention suture. A dressing was placed. The patient tolerated the procedure well without immediate post procedural complication. IMPRESSION: Successful placement of a right internal jugular approach 16 cm temporary dialysis catheter with tip terminating with in the right atrium. The catheter is ready for immediate use. PLAN: This catheter may be converted to a tunneled dialysis catheter at a later date as indicated. Ruthann Cancer, MD [facility] Radiology Electronically Signed   By: Ruthann Cancer MD   On: 04/30/2020 13:27   IR US Guide Vasc Access Right  Result Date: 04/30/2020 INDICATION: 50 year old male with history of acute kidney injury on chronic kidney disease requiring central venous access for hemodialysis. EXAM: NON-TUNNELED CENTRAL VENOUS HEMODIALYSIS CATHETER PLACEMENT WITH ULTRASOUND AND FLUOROSCOPIC GUIDANCE COMPARISON:  None. MEDICATIONS: None. Moderate (conscious) sedation was employed during this procedure. A total of Versed 0 mg and Fentanyl 50 mcg was administered intravenously. Moderate Sedation Time: 11 minutes. The patient's level of consciousness and vital signs were monitored continuously by radiology nursing throughout the procedure under my direct supervision. FLUOROSCOPY TIME:  0 minutes, 6 seconds (1 mGy) COMPLICATIONS: None immediate. PROCEDURE: Informed written consent was obtained from the patient after a discussion of the risks, benefits, and alternatives to treatment. Questions regarding the procedure were encouraged and answered. The right neck and chest were prepped with chlorhexidine in a sterile fashion, and a sterile drape was applied covering the operative field. Maximum barrier sterile technique with sterile gowns and gloves were used for the procedure. A timeout was performed prior to the initiation of the procedure. After the overlying soft tissues were  anesthetized, a small venotomy incision was created and a micropuncture kit was utilized to access the internal jugular vein. Real-time ultrasound guidance was utilized for vascular access including the acquisition of a permanent ultrasound image documenting patency of the accessed vessel. The microwire was utilized to measure appropriate catheter length. A stiff glidewire was advanced to the level of the IVC. Under fluoroscopic guidance, the venotomy was serially dilated, ultimately allowing placement of a 16 cm temporary Trialysis catheter with tip ultimately terminating within the superior aspect of the right atrium. Final catheter positioning was confirmed and documented with a spot radiographic image. The catheter aspirates and flushes normally. The catheter was flushed with appropriate volume heparin dwells. The catheter exit site was secured with a 0-Prolene retention suture. A dressing was placed. The patient tolerated the procedure well without immediate post procedural complication. IMPRESSION: Successful placement of a right internal jugular approach 16 cm temporary dialysis catheter with tip terminating with in the right atrium. The catheter is ready for immediate use. PLAN: This catheter may be converted to a tunneled dialysis catheter at a later date as indicated. Ruthann Cancer, MD [facility] Radiology Electronically Signed   By: Ruthann Cancer MD   On: 04/30/2020 13:27   US BIOPSY (KIDNEY)  Result Date: 04/30/2020 INDICATION: 50 year old male with acute kidney injury. EXAM: ULTRASOUND GUIDED RENAL BIOPSY COMPARISON:  None. MEDICATIONS: None. ANESTHESIA/SEDATION: Fentanyl 50 mcg IV; Versed 2 mg IV Total Moderate Sedation time: 10 minutes; The patient was continuously monitored during the procedure by the interventional radiology nurse under my direct supervision. COMPLICATIONS: None immediate. PROCEDURE: Informed written consent was obtained from the  patient after a discussion of the risks, benefits and alternatives to treatment. The patient understands and consents the procedure. A timeout was performed prior to the  initiation of the procedure. Ultrasound scanning was performed of the bilateral flanks. The inferior pole of the left kidney was selected for biopsy due to location and sonographic window. The procedure was planned. The operative site was prepped and draped in the usual sterile fashion. The overlying soft tissues were anesthetized with 1% lidocaine with epinephrine. A 16 gauge core needle biopsy device was advanced into the inferior cortex of the left kidney and 2 core biopsies were obtained under direct ultrasound guidance. Images were saved for documentation purposes. The biopsy device was removed and hemostasis was obtained with manual compression. Post procedural scanning was negative for significant post procedural hemorrhage or additional complication. A dressing was placed. The patient tolerated the procedure well without immediate post procedural complication. IMPRESSION: Technically successful ultrasound guided left renal biopsy. Ruthann Cancer, MD [facility] Radiology Electronically Signed   By: Ruthann Cancer MD   On: 04/30/2020 12:47    Labs:  CBC: Recent Labs    05/01/20 2320 05/02/20 0500 05/02/20 1320 05/03/20 0327  WBC 5.0 4.8 5.0 4.8  HGB 7.6* 7.6* 7.7* 7.8*  HCT 21.2* 21.6* 22.5* 22.9*  PLT 173 160 143* 149*    COAGS: Recent Labs    04/29/20 1358  INR 1.1    BMP: Recent Labs    04/30/20 0403 05/01/20 0410 05/02/20 0500 05/03/20 0327  NA 131*  131* 132* 130* 133*  K 6.3*  6.3* 4.7 4.8 3.9  CL 108  108 105 102 103  CO2 12*  11* 20* 18* 23  GLUCOSE 146*  146* 175* 157* 135*  BUN 90*  89* 66* 88* 63*  CALCIUM 7.8*  7.8* 7.4* 7.3* 7.2*  CREATININE 5.54*  5.58* 5.12* 6.18* 4.88*  GFRNONAA 12*  12* 13* 10* 14*    LIVER FUNCTION  TESTS: Recent Labs    04/30/20 0403 05/01/20 0410 05/02/20 0500 05/03/20 0327  BILITOT 0.4 0.7 0.5 0.4  AST _0 ALT _1 ALKPHOS 52 52 61 64  PROT 5.5* 4.8* 4.7* 4.6*  ALBUMIN 1.8*  1.8* 1.5* 1.5* 1.5*    Assessment and Plan: Renal failure requiring intermittent HD Patient with temp cath placed in IR 04/30/20, tolerating dialysis.  Now in need of tunneled HD catheter.  WBC 4.8, afebrile. Plan to proceed with tunneled HD catheter placement today as able.   Patient does say that this was expected on Monday.  Discussed with Dr. Joelyn Oms who reports ok to proceed today vs. Early next week as schedule allows.  [name] has been NPO since 730AM. Risks and benefits discussed with the patient including, but not limited to bleeding, infection, vascular injury, pneumothorax which may require chest tube placement, air embolism or even death  All of the patient's questions were answered, patient is agreeable to proceed. Consent signed and in chart.  Retroperitoneal hematoma s/p kidney biopsy 04/29/20.  Patient with stable hemoglobin for the past several days.  Expected back soreness which is stable.  Appears to be recovery without need for additional intervention.   Electronically Signed: Docia Barrier, PA 05/03/2020, 11:00 AM   I spent a total of 15 Minutes at the the patient's bedside AND on the patient's hospital floor or unit, greater than 50% of which was counseling/coordinating care for acute renal failure, retroperitoneal hematoma.

## 2020-05-04 DIAGNOSIS — I5031 Acute diastolic (congestive) heart failure: Secondary | ICD-10-CM | POA: Diagnosis not present

## 2020-05-04 LAB — BASIC METABOLIC PANEL
Anion gap: 12 (ref 5–15)
BUN: 81 mg/dL — ABNORMAL HIGH (ref 6–20)
CO2: 20 mmol/L — ABNORMAL LOW (ref 22–32)
Calcium: 7.3 mg/dL — ABNORMAL LOW (ref 8.9–10.3)
Chloride: 100 mmol/L (ref 98–111)
Creatinine, Ser: 5.67 mg/dL — ABNORMAL HIGH (ref 0.61–1.24)
GFR, Estimated: 11 mL/min — ABNORMAL LOW (ref >60)
Glucose, Bld: 172 mg/dL — ABNORMAL HIGH (ref 70–99)
Potassium: 4.3 mmol/L (ref 3.5–5.1)
Sodium: 132 mmol/L — ABNORMAL LOW (ref 135–145)

## 2020-05-04 LAB — CBC
HCT: 23.8 % — ABNORMAL LOW (ref 39.0–52.0)
Hemoglobin: 8.3 g/dL — ABNORMAL LOW (ref 13.0–17.0)
MCH: 30.2 pg (ref 26.0–34.0)
MCHC: 34.9 g/dL (ref 30.0–36.0)
MCV: 86.5 fL (ref 80.0–100.0)
Platelets: 150 10*3/uL (ref 150–400)
RBC: 2.75 MIL/uL — ABNORMAL LOW (ref 4.22–5.81)
RDW: 12.7 % (ref 11.5–15.5)
WBC: 4.6 10*3/uL (ref 4.0–10.5)
nRBC: 0 % (ref 0.0–0.2)

## 2020-05-04 LAB — CULTURE, BODY FLUID W GRAM STAIN -BOTTLE: Culture: NO GROWTH

## 2020-05-04 MED ORDER — ALBUMIN HUMAN 25 % IV SOLN
INTRAVENOUS | Status: AC
  Filled 2020-05-04: qty 50

## 2020-05-04 MED ORDER — HEPARIN SODIUM (PORCINE) 1000 UNIT/ML IJ SOLN
INTRAMUSCULAR | Status: AC
  Administered 2020-05-04: 3200 [IU]
  Filled 2020-05-04: qty 4

## 2020-05-04 MED ORDER — ALBUMIN HUMAN 25 % IV SOLN
25.0000 g | Freq: Once | INTRAVENOUS | Status: AC
  Administered 2020-05-04: 25 g via INTRAVENOUS

## 2020-05-04 NOTE — Procedures (Signed)
I was present at this dialysis session. I have reviewed the session itself and made appropriate changes.   Stable VS.  Hb > 8.  TDC Placed yesterday.  Labs not c/w recovery of GFR, not much UOP it appears but not quantified after Foley removed.  Working with path yesterday, it appears that tissue will be sent to [facility], but turn-round time is unclear.  Check with them again next week and will need to decide about empirically add MMF for very likely aggressive lupus nephritis.    Filed Weights   05/02/20 1037 05/03/20 0529 05/04/20 0403  Weight: 83.1 kg 86.6 kg 84.1 kg    Recent Labs  Lab 05/01/20 0410 05/02/20 0500 05/04/20 0101  NA 132*   < > 132*  K 4.7   < > 4.3  CL 105   < > 100  CO2 20*   < > 20*  GLUCOSE 175*   < > 172*  BUN 66*   < > 81*  CREATININE 5.12*   < > 5.67*  CALCIUM 7.4*   < > 7.3*  PHOS 8.0*  --   --    < > = values in this interval not displayed.    Recent Labs  Lab 04/27/20 2016 04/28/20 0431 04/30/20 0403 05/01/20 0410 05/02/20 1320 05/03/20 0327 05/04/20 0101  WBC 2.8* 2.6* 2.0*   < > 5.0 4.8 4.6  NEUTROABS 1.9 2.1 1.9  --   --   --   --   HGB 8.6* 8.3* 8.0*   < > 7.7* 7.8* 8.3*  HCT 25.9* 25.0* 24.2*   < > 22.5* 22.9* 23.8*  MCV 90.9 91.2 90.0   < > 85.2 86.7 86.5  PLT 188 166 174   < > 143* 149* 150   < > = values in this interval not displayed.    Scheduled Meds: . (feeding supplement) PROSource Plus  30 mL Oral TID BM  . sodium chloride   Intravenous Once  . Chlorhexidine Gluconate Cloth  6 each Topical Daily  . darbepoetin (ARANESP) injection - DIALYSIS  60 mcg Intravenous Q Thu-HD  . feeding supplement (NEPRO CARB STEADY)  237 mL Oral Q24H  . folic acid  1 mg Oral Daily  . kidney failure book   Does not apply Once  . levothyroxine  50 mcg Oral Q0600  . multivitamin  1 tablet Oral QHS  . polyethylene glycol  17 g Oral Daily  . predniSONE  60 mg Oral Q breakfast  . sodium chloride flush  3 mL Intravenous Q12H  .  sulfamethoxazole-trimethoprim  1 tablet Oral Once per day on Mon Wed Fri  . vitamin B-12  1,000 mcg Oral Daily   Continuous Infusions: . sodium chloride     PRN Meds:.sodium chloride, acetaminophen, bisacodyl, flavoxATE, ondansetron (ZOFRAN) IV, oxyCODONE-acetaminophen, sodium chloride flush   Pearson Grippe  MD 05/04/2020, 8:21 AM

## 2020-05-04 NOTE — Progress Notes (Signed)
HD was started at [place], DR. Sanford arrived at bedside in the HD suite and verbally requested an increase of the UF goal from 3L net to 3.5 L net. UF goal on the machine was changed right away at bedside to reflect the increase per MD.

## 2020-05-04 NOTE — Progress Notes (Signed)
PROGRESS NOTE  [name] NLG:[id number] DOB: October 06, 1970 DOA: 04/27/2020 PCP: [name], MD   LOS: 7 days   Brief narrative:  [name] is a 50 y.o. male with past medical history of lupus, chronic pancytopenia, B12 deficiency, hypothyroidism presented to the hospital with shortness of breath, abdominal distention and swelling of the legs for 3 weeks.  Patient does have history of lupus, was seen by rheumatologist 3 to 4 months ago but stopped taking Imuran by himself. In the ED, patient was to have severe abdominal distention as well as leg swelling.  CT scan of the chest and abdomen showed evidence of ascites as well as anasarca.  Work-up also showed hypoalbuminemia and significant AKI.  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, suppressed C3 and C4, weakly positive ASO, negative anti-GBM -Nephrology following -Started on high-dose IV Solu-Medrol, now prednisone 60 mg and high-dose Lasix  -Remains volume overloaded, started hemodialysis 1/4 -Underwent kidney biopsy in IR 1/4, complicated by large retroperitoneal hematoma, results pending -Also underwent paracentesis on on 1/2 with 2 L drained followed by 1/4, 5 L drained -Per nephrology, clip for hemodialysis started  Acute blood loss anemia Large retroperitoneal hematoma -Following kidney biopsy, CT scan revealed large 11 x 6 cm hematoma -1 unit of PRBC transfused 1/5, hemoglobin relatively stable,  -CBC 8.3 this a.m.  Hyponatremia.  -Secondary to hypervolemia, monitor with HD  History of lupus, pleurisy, pericardial effusion -  2D echocardiogram from 04/28/2020 showed LV ejection fraction of 55 to 60% with moderate LVH but no  pericardial effusion but trace pleural effusion.  Followed by Dr. Kathlene November in the past.  Currently on high-dose prednisone  Bladder pain likely spasms.   -after Foley catheter placement.  Improved.  On flavoxate, oxycodone  History of Lupus. Noncompliant with medication regimen in the past.  Will need continued follow-up as outpatient with rheumatologist.  Chronic pancytopenia/ leukopenia. -Likely secondary to lupus, was seen by oncology in the past and had bone marrow biopsy.    Hypothyroidism TSH of 7.8. Continue Synthroid.  Repeat her thyroid function as outpatient.   DVT prophylaxis: SCDs due to large retroperitoneal hematoma Code Status: Full code Family Communication: None Status is: Inpatient  Remains inpatient appropriate because:IV treatments appropriate due to intensity of illness or inability to take PO and Inpatient level of care appropriate due to severity of illness, severe AKI with fluid overload  Dispo: The patient is from: Home              Anticipated d/c is to: Home              Anticipated d/c date is: >3days              Patient currently is not medically stable to d/c.   Consultants: Nephrology IR  Procedures:  Paracentesis 1/2 and 1/4  Renal biopsy 1/4  Antibiotics:  . None  Anti-infectives (From admission, onward)   Start     Dose/Rate Route Frequency Ordered Stop   05/03/20 1230  ceFAZolin (ANCEF) IVPB 2g/100 mL premix  Status:  Discontinued        2 g 200 mL/hr over 30 Minutes Intravenous  Once 05/03/20 1132 05/03/20 1132   05/03/20 1230  ceFAZolin (ANCEF) IVPB 2g/100 mL premix  2 g 200 mL/hr over 30 Minutes Intravenous On call 05/03/20 1132 05/03/20 1259   05/03/20 1015  sulfamethoxazole-trimethoprim (BACTRIM DS) 800-160 MG per tablet 1 tablet        1 tablet Oral Once per day on Mon Wed Fri 05/03/20 1914       Subjective: -No events overnight, seen on dialysis, dyspnea improving  Objective: Vitals:   05/04/20 1205 05/04/20 1245   BP: 120/72 122/70  Pulse: 93 92  Resp: 19 15  Temp:  98.4 F (36.9 C)  SpO2: 98% 98%    Intake/Output Summary (Last 24 hours) at 05/04/2020 1317 Last data filed at 05/04/2020 1200 Gross per 24 hour  Intake 613 ml  Output 2590 ml  Net -1977 ml   Filed Weights   05/02/20 1037 05/03/20 0529 05/04/20 0403  Weight: 83.1 kg 86.6 kg 84.1 kg   Body mass index is 25.86 kg/m.   Physical Exam:  General: Pleasant male seen on dialysis, AAOx3, no distress HEENT: Right IJ HD catheter noted CVS: S1-S2, regular rate rhythm Lungs: Decreased breath sounds the bases Abdomen: Soft, nontender, abdominal wall edema,  extremities: 2+ edema  Skin: No rashes on exposed skin  Data Review: I have personally reviewed the following laboratory data and studies,  CBC: Recent Labs  Lab 04/27/20 2016 04/28/20 0431 04/30/20 0403 05/01/20 0410 05/01/20 2320 05/02/20 0500 05/02/20 1320 05/03/20 0327 05/04/20 0101  WBC 2.8* 2.6* 2.0*   < > 5.0 4.8 5.0 4.8 4.6  NEUTROABS 1.9 2.1 1.9  --   --   --   --   --   --   HGB 8.6* 8.3* 8.0*   < > 7.6* 7.6* 7.7* 7.8* 8.3*  HCT 25.9* 25.0* 24.2*   < > 21.2* 21.6* 22.5* 22.9* 23.8*  MCV 90.9 91.2 90.0   < > 85.8 86.1 85.2 86.7 86.5  PLT 188 166 174   < > 173 160 143* 149* 150   < > = values in this interval not displayed.   Basic Metabolic Panel: Recent Labs  Lab 04/28/20 0431 04/29/20 0445 04/30/20 0403 05/01/20 0410 05/02/20 0500 05/03/20 0327 05/04/20 0101  NA 135 133* 131*  131* 132* 130* 133* 132*  K 5.4* 5.4* 6.3*  6.3* 4.7 4.8 3.9 4.3  CL 113* 112* 108  108 105 102 103 100  CO2 14* 12* 12*  11* 20* 18* 23 20*  GLUCOSE 114* 90 146*  146* 175* 157* 135* 172*  BUN 84* 85* 90*  89* 66* 88* 63* 81*  CREATININE 4.19* 4.83* 5.54*  5.58* 5.12* 6.18* 4.88* 5.67*  CALCIUM 7.6* 7.7* 7.8*  7.8* 7.4* 7.3* 7.2* 7.3*  MG  --   --  2.4 2.0  --   --   --   PHOS 8.8* 9.3* 9.1*  10.0* 8.0*  --   --   --    Liver Function Tests: Recent Labs  Lab  04/27/20 2016 04/28/20 0431 04/29/20 0445 04/30/20 0403 05/01/20 0410 05/02/20 0500 05/03/20 0327  AST 21  --   --  _0 ALT 19  --   --  _1 ALKPHOS 66  --   --  52 52 61 64  BILITOT 0.3  --   --  0.4 0.7 0.5 0.4  PROT 5.6*  --   --  5.5* 4.8* 4.7* 4.6*  ALBUMIN 1.6*   < > 2.0* 1.8*  1.8* 1.5* 1.5* 1.5*   < > =  values in this interval not displayed.   No results for input(s): LIPASE, AMYLASE in the last 168 hours. No results for input(s): AMMONIA in the last 168 hours. Cardiac Enzymes: Recent Labs  Lab 04/28/20 0431  CKTOTAL 65   BNP (last 3 results) Recent Labs    04/27/20 2016  BNP 36.5    ProBNP (last 3 results) Recent Labs    11/06/19 1613  PROBNP 337.0*    CBG: Recent Labs  Lab 04/30/20 1056  GLUCAP 148*   Recent Results (from the past 240 hour(s))  Resp Panel by RT-PCR (Flu A&B, Covid) Nasopharyngeal Swab     Status: None   Collection Time: 04/27/20  8:22 PM   Specimen: Nasopharyngeal Swab; Nasopharyngeal(NP) swabs in vial transport medium  Result Value Ref Range Status   SARS Coronavirus 2 by RT PCR NEGATIVE NEGATIVE Final    Comment: (NOTE) SARS-CoV-2 target nucleic acids are NOT DETECTED.  The SARS-CoV-2 RNA is generally detectable in upper respiratory specimens during the acute phase of infection. The lowest concentration of SARS-CoV-2 viral copies this assay can detect is 138 copies/mL. A negative result does not preclude SARS-Cov-2 infection and should not be used as the sole basis for treatment or other patient management decisions. A negative result may occur with  improper specimen collection/handling, submission of specimen other than nasopharyngeal swab, presence of viral mutation(s) within the areas targeted by this assay, and inadequate number of viral copies(<138 copies/mL). A negative result must be combined with clinical observations, patient history, and epidemiological information. The expected result is  Negative.  Fact Sheet for Patients:  EntrepreneurPulse.com.au  Fact Sheet for Healthcare Providers:  IncredibleEmployment.be  This test is no t yet approved or cleared by the [place] FDA and  has been authorized for detection and/or diagnosis of SARS-CoV-2 by FDA under an Emergency Use Authorization (EUA). This EUA will remain  in effect (meaning this test can be used) for the duration of the COVID-19 declaration under Section 564(b)(1) of the Act, 21 U.S.C.section 360bbb-3(b)(1), unless the authorization is terminated  or revoked sooner.       Influenza A by PCR NEGATIVE NEGATIVE Final   Influenza B by PCR NEGATIVE NEGATIVE Final    Comment: (NOTE) The Xpert Xpress SARS-CoV-2/FLU/RSV plus assay is intended as an aid in the diagnosis of influenza from Nasopharyngeal swab specimens and should not be used as a sole basis for treatment. Nasal washings and aspirates are unacceptable for Xpert Xpress SARS-CoV-2/FLU/RSV testing.  Fact Sheet for Patients: EntrepreneurPulse.com.au  Fact Sheet for Healthcare Providers: IncredibleEmployment.be  This test is not yet approved or cleared by the [place] FDA and has been authorized for detection and/or diagnosis of SARS-CoV-2 by FDA under an Emergency Use Authorization (EUA). This EUA will remain in effect (meaning this test can be used) for the duration of the COVID-19 declaration under Section 564(b)(1) of the Act, 21 U.S.C. section 360bbb-3(b)(1), unless the authorization is terminated or revoked.  Performed at [facility], [place] [address], [place], [place] [address]   Culture, Urine     Status: Abnormal   Collection Time: 04/28/20 11:27 AM   Specimen: Urine, Clean Catch  Result Value Ref Range Status   Specimen Description   Final    URINE, CLEAN CATCH Performed at [facility], [place] [address],  [place],  [address]    Special Requests   Final    NONE Performed at [facility], [address] [place], [place]  [address]    Culture >=100,000 COLONIES/mL STAPHYLOCOCCUS AUREUS (A)  Final   Report Status 04/30/2020 FINAL  Final   Organism ID, Bacteria STAPHYLOCOCCUS AUREUS (A)  Final      Susceptibility   Staphylococcus aureus - MIC*    CIPROFLOXACIN <=0.5 SENSITIVE Sensitive     GENTAMICIN <=0.5 SENSITIVE Sensitive     NITROFURANTOIN <=16 SENSITIVE Sensitive     OXACILLIN <=0.25 SENSITIVE Sensitive     TETRACYCLINE <=1 SENSITIVE Sensitive     VANCOMYCIN 1 SENSITIVE Sensitive     TRIMETH/SULFA <=10 SENSITIVE Sensitive     CLINDAMYCIN <=0.25 SENSITIVE Sensitive     RIFAMPIN <=0.5 SENSITIVE Sensitive     Inducible Clindamycin NEGATIVE Sensitive     * >=100,000 COLONIES/mL STAPHYLOCOCCUS AUREUS  Culture, body fluid-bottle     Status: None (Preliminary result)   Collection Time: 04/28/20 11:30 AM   Specimen: Peritoneal Washings  Result Value Ref Range Status   Specimen Description PERITONEAL FLUID  Final   Special Requests NONE  Final   Culture   Final    NO GROWTH 4 DAYS Performed at [facility], [address], [place], [place] [address]    Report Status PENDING  Incomplete  Gram stain     Status: None   Collection Time: 04/28/20 11:30 AM   Specimen: Peritoneal Washings  Result Value Ref Range Status   Specimen Description PERITONEAL FLUID  Final   Special Requests NONE  Final   Gram Stain   Final    WBC PRESENT,BOTH PMN AND MONONUCLEAR NO ORGANISMS SEEN CYTOSPIN SMEAR Performed at [facility], [address], [place], [place] [address]    Report Status 04/28/2020 FINAL  Final  MRSA PCR Screening     Status: None   Collection Time: 04/30/20  5:59 PM   Specimen: Nasal Mucosa; Nasopharyngeal  Result Value Ref Range Status   MRSA by PCR NEGATIVE NEGATIVE Final    Comment:        The GeneXpert MRSA Assay (FDA approved for NASAL  specimens only), is one component of a comprehensive MRSA colonization surveillance program. It is not intended to diagnose MRSA infection nor to guide or monitor treatment for MRSA infections. Performed at [facility], [place] [address] [place], [place] [address]      Studies: IR US Guide Vasc Access Right  Result Date: 05/03/2020 INDICATION: Acute kidney injury, access for dialysis EXAM: ULTRASOUND GUIDANCE FOR VASCULAR ACCESS RIGHT INTERNAL JUGULAR PERMANENT HEMODIALYSIS CATHETER Date:  05/03/2020 05/03/2020 4:16 pm Radiologist:  Jerilynn Mages. Daryll Brod, MD Guidance:  Ultrasound and fluoroscopic FLUOROSCOPY TIME:  Fluoroscopy Time: 1 minutes 0 seconds (17 mGy). MEDICATIONS: Ancef 2 g administered prior to the procedure ANESTHESIA/SEDATION: Versed 100 mg IV; Fentanyl 2.0 mcg IV; Moderate Sedation Time:  24 minutes The patient was continuously monitored during the procedure by the interventional radiology nurse under my direct supervision. CONTRAST:  None. COMPLICATIONS: None immediate. PROCEDURE: Informed consent was obtained from the patient following explanation of the procedure, risks, benefits and alternatives. The patient understands, agrees and consents for the procedure. All questions were addressed. A time out was performed. Maximal barrier sterile technique utilized including caps, mask, sterile gowns, sterile gloves, large sterile drape, hand hygiene, and 2% chlorhexidine scrub. Under sterile conditions and local anesthesia, right internal jugular micropuncture venous access was performed with ultrasound. Images were obtained for documentation of the patent right internal jugular vein. A guide wire was inserted followed by a transitional dilator. Next, a 0.035  guidewire was advanced into the IVC with a 5-French catheter. Measurements were obtained from the right venotomy site to the proximal right atrium. In the right infraclavicular chest, a subcutaneous tunnel was created under sterile  conditions and local anesthesia. 1% lidocaine with epinephrine was utilized for this. The 19 cm tip to cuff palindrome catheter was tunneled subcutaneously to the venotomy site and inserted into the SVC/RA junction through a valved peel-away sheath. Position was confirmed with fluoroscopy. Images were obtained for documentation. Blood was aspirated from the catheter followed by saline and heparin flushes. The appropriate volume and strength of heparin was instilled in each lumen. Caps were applied. The catheter was secured at the tunnel site with Gelfoam and a pursestring suture. The venotomy site was closed with subcuticular Vicryl suture. Dermabond was applied to the small right neck incision. A dry sterile dressing was applied. The catheter is ready for use. No immediate complications. IMPRESSION: Ultrasound and fluoroscopically guided right internal jugular tunneled hemodialysis catheter (19 cm tip to cuff palindrome catheter). Electronically Signed   By: Jerilynn Mages.  Shick M.D.   On: 05/03/2020 16:33   IR TUNNELED CENTRAL VENOUS CATHETER PLACEMENT  Result Date: 05/03/2020 INDICATION: Acute kidney injury, access for dialysis EXAM: ULTRASOUND GUIDANCE FOR VASCULAR ACCESS RIGHT INTERNAL JUGULAR PERMANENT HEMODIALYSIS CATHETER Date:  05/03/2020 05/03/2020 4:16 pm Radiologist:  Jerilynn Mages. Daryll Brod, MD Guidance:  Ultrasound and fluoroscopic FLUOROSCOPY TIME:  Fluoroscopy Time: 1 minutes 0 seconds (17 mGy). MEDICATIONS: Ancef 2 g administered prior to the procedure ANESTHESIA/SEDATION: Versed 100 mg IV; Fentanyl 2.0 mcg IV; Moderate Sedation Time:  24 minutes The patient was continuously monitored during the procedure by the interventional radiology nurse under my direct supervision. CONTRAST:  None. COMPLICATIONS: None immediate. PROCEDURE: Informed consent was obtained from the patient following explanation of the procedure, risks, benefits and alternatives. The patient understands, agrees and consents for the procedure. All  questions were addressed. A time out was performed. Maximal barrier sterile technique utilized including caps, mask, sterile gowns, sterile gloves, large sterile drape, hand hygiene, and 2% chlorhexidine scrub. Under sterile conditions and local anesthesia, right internal jugular micropuncture venous access was performed with ultrasound. Images were obtained for documentation of the patent right internal jugular vein. A guide wire was inserted followed by a transitional dilator. Next, a 0.035 guidewire was advanced into the IVC with a 5-French catheter. Measurements were obtained from the right venotomy site to the proximal right atrium. In the right infraclavicular chest, a subcutaneous tunnel was created under sterile conditions and local anesthesia. 1% lidocaine with epinephrine was utilized for this. The 19 cm tip to cuff palindrome catheter was tunneled subcutaneously to the venotomy site and inserted into the SVC/RA junction through a valved peel-away sheath. Position was confirmed with fluoroscopy. Images were obtained for documentation. Blood was aspirated from the catheter followed by saline and heparin flushes. The appropriate volume and strength of heparin was instilled in each lumen. Caps were applied. The catheter was secured at the tunnel site with Gelfoam and a pursestring suture. The venotomy site was closed with subcuticular Vicryl suture. Dermabond was applied to the small right neck incision. A dry sterile dressing was applied. The catheter is ready for use. No immediate complications. IMPRESSION: Ultrasound and fluoroscopically guided right internal jugular tunneled hemodialysis catheter (19 cm tip to cuff palindrome catheter). Electronically Signed   By: Jerilynn Mages.  Shick M.D.   On: 05/03/2020 16:33    Domenic Polite, MD  [facility] 05/04/2020

## 2020-05-05 DIAGNOSIS — I5031 Acute diastolic (congestive) heart failure: Secondary | ICD-10-CM | POA: Diagnosis not present

## 2020-05-05 LAB — CBC
HCT: 23.6 % — ABNORMAL LOW (ref 39.0–52.0)
Hemoglobin: 8.1 g/dL — ABNORMAL LOW (ref 13.0–17.0)
MCH: 30 pg (ref 26.0–34.0)
MCHC: 34.3 g/dL (ref 30.0–36.0)
MCV: 87.4 fL (ref 80.0–100.0)
Platelets: 181 10*3/uL (ref 150–400)
RBC: 2.7 MIL/uL — ABNORMAL LOW (ref 4.22–5.81)
RDW: 12.5 % (ref 11.5–15.5)
WBC: 6 10*3/uL (ref 4.0–10.5)
nRBC: 0 % (ref 0.0–0.2)

## 2020-05-05 LAB — RENAL FUNCTION PANEL
Albumin: 2 g/dL — ABNORMAL LOW (ref 3.5–5.0)
Anion gap: 9 (ref 5–15)
BUN: 53 mg/dL — ABNORMAL HIGH (ref 6–20)
CO2: 26 mmol/L (ref 22–32)
Calcium: 7.6 mg/dL — ABNORMAL LOW (ref 8.9–10.3)
Chloride: 98 mmol/L (ref 98–111)
Creatinine, Ser: 4.64 mg/dL — ABNORMAL HIGH (ref 0.61–1.24)
GFR, Estimated: 15 mL/min — ABNORMAL LOW (ref >60)
Glucose, Bld: 123 mg/dL — ABNORMAL HIGH (ref 70–99)
Phosphorus: 5.9 mg/dL — ABNORMAL HIGH (ref 2.5–4.6)
Potassium: 3.8 mmol/L (ref 3.5–5.1)
Sodium: 133 mmol/L — ABNORMAL LOW (ref 135–145)

## 2020-05-05 MED ORDER — MYCOPHENOLATE MOFETIL 250 MG PO CAPS
500.0000 mg | ORAL_CAPSULE | Freq: Two times a day (BID) | ORAL | Status: DC
Start: 2020-05-05 — End: 2020-05-08
  Administered 2020-05-05 – 2020-05-07 (×6): 500 mg via ORAL
  Filled 2020-05-05 (×7): qty 2

## 2020-05-05 MED ORDER — POLYETHYLENE GLYCOL 3350 17 G PO PACK
17.0000 g | PACK | Freq: Two times a day (BID) | ORAL | Status: DC
  Administered 2020-05-05 – 2020-05-10 (×11): 17 g via ORAL
  Filled 2020-05-05 (×12): qty 1

## 2020-05-05 NOTE — Progress Notes (Signed)
Admit: 04/27/2020 LOS: 8  48M hx/o SLE off IS presenting with AKI, hypervolemia/ascites, hematuria, proteinuria  Subjective:  . No interval events . HD yesterday: 2.6L UF, no sig UOP . AM Hb 8.1.   [facility] Path tissue misdirected to [facility] Path, not [facility]; Local path is working to get ot [facility]   01/08 0701 - 01/09 0700 In: 782 [P.O.:840; I.V.:3] Out: 2740 [Urine:150]  Filed Weights   05/03/20 0529 05/04/20 0403 05/05/20 0333  Weight: 86.6 kg 84.1 kg 82.9 kg    Scheduled Meds: . (feeding supplement) PROSource Plus  30 mL Oral TID BM  . sodium chloride   Intravenous Once  . Chlorhexidine Gluconate Cloth  6 each Topical Daily  . darbepoetin (ARANESP) injection - DIALYSIS  60 mcg Intravenous Q Thu-HD  . feeding supplement (NEPRO CARB STEADY)  237 mL Oral Q24H  . folic acid  1 mg Oral Daily  . kidney failure book   Does not apply Once  . levothyroxine  50 mcg Oral Q0600  . multivitamin  1 tablet Oral QHS  . polyethylene glycol  17 g Oral BID  . predniSONE  60 mg Oral Q breakfast  . sodium chloride flush  3 mL Intravenous Q12H  . sulfamethoxazole-trimethoprim  1 tablet Oral Once per day on Mon Wed Fri  . vitamin B-12  1,000 mcg Oral Daily   Continuous Infusions: . sodium chloride     PRN Meds:.sodium chloride, acetaminophen, bisacodyl, flavoxATE, ondansetron (ZOFRAN) IV, oxyCODONE-acetaminophen, sodium chloride flush  Current Labs: reviewed  Results for ARTIS, BEGGS (MRN [id number]) as of 04/29/2020 13:40 Hepatitis B Surface Ag Latest Ref Range: NON REACTIVE  NON REACTIVE    Ref. Range 04/28/2020 19:21  HCV Ab Latest Ref Range: NON REACTIVE  NON REACTIVE   Results for MAVERICK, DIEUDONNE (MRN [id number]) as of 04/29/2020 13:40 IV Screen 4th Generation wRfx  Latest Ref Range: Non Reactive  Non Reactive   Results for CHUKWUKA, FESTA (MRN [id number]) as of 04/30/2020 14:03  Ref. Range 04/28/2020 19:21  ds DNA Ab Latest Ref Range: 0 - 9 IU/mL >300 (H)    C4 5, C3 47  Results for LAVON, BOTHWELL (MRN [id number]) as of 05/03/2020 09:26  Ref. Range 05/02/2020 13:05  Saturation Ratios Latest Ref Range: 17.9 - 39.5 % 93 (H)  Ferritin Latest Ref Range: 24 - 336 ng/mL 564 (H)    Physical Exam:  Blood pressure (!) 146/90, pulse 70, temperature 97.8 F (36.6 C), temperature source Oral, resp. rate 17, height 5\' 11"  (1.803 m), weight 82.9 kg, SpO2 96 %. NAD in bed  RRR no rub Nl WOB 3+ LEE Less distended abdomen, soft No flank or back bruising/tenderness NCAT EOMI R IJ Tunneled HD Cath  A 1. AKI/RPGN with hematuria/proteinuria and hx/o #2;  1. hypocomplementemia with inc dsDNA and +ANA; neg AntiGBM; ANCA neg 2. Started HD 04/30/20 3. Renal Bx 04/30/20 c/b perinephric hematoma and ABLA, see #9 4. Temp HD cath 04/30/20, then tunneled on 1/7 5. S/p solumedrol 500mg  IV x3d 04/29/20-05/01/20 --> Pred 60mg /d for now 6. Very high supsicion for Class III or more likley IV, +/- V LN; await prelim Bx but was not sent to [facility] for nephropathology, local path group working on this; not sure how much info we can expect from [facility] since they will have to work with processed tissue.  7. 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  Cont  pred with taper in time.  Cont TIW TMP/SMX 8. Need to complet outpt AKI CLIP anticipating prolonged HD dependence 9. PT/OT 2. Hx/o SLE, followed by [facility], prev on imuran off sev mo, hx/o pleuritis/pericarditis 3. Ascites/Hypervolemia likely related #1 and #4; TTE w/ nL LVEF and R sided function 1. LVP 2L 1/2 and then 5L on 1/4  2. Hopefully can manage vol with HD 4. Hypoalbuminemia; have used bolus albumin in HD to augment UF 5. Pancytopenia; mild/improved 6. Hyperkalemia, mild, resolved with HD 7. Metabolic Acidosis, NAG related to #1, improved with HD 8. Hyperphosphatemia, trend with HD 9. Anemia, AoC, 1.7 drop in Hb in 24h post bx, imaging with hematoma, stabilized and hemodynamics stable; IR following.  On ESA;  High TSAT no role for IV Fe  P . HD tomorrow: 4h, TDC 3K, no heparin 3-4L UF, IVB albumin . Cont pred 60mg /d, taper after 58m . Begin MMF 500 BID . F/u info from [facility] . He declines COVID19 Vaccine, did just rec monoclonal Ab infusion as a close contact.  . Daily weights, Daily Renal Panel, Strict I/Os, Avoid nephrotoxins (NSAIDs, judicious IV Contrast)  Pearson Grippe MD 05/05/2020, 1:02 PM  Recent Labs  Lab 04/30/20 0403 05/01/20 0410 05/02/20 0500 05/03/20 0327 05/04/20 0101 05/05/20 0737  NA 131*  131* 132*   < > 133* 132* 133*  K 6.3*  6.3* 4.7   < > 3.9 4.3 3.8  CL 108  108 105   < > 103 100 98  CO2 12*  11* 20*   < > 23 20* 26  GLUCOSE 146*  146* 175*   < > 135* 172* 123*  BUN 90*  89* 66*   < > 63* 81* 53*  CREATININE 5.54*  5.58* 5.12*   < > 4.88* 5.67* 4.64*  CALCIUM 7.8*  7.8* 7.4*   < > 7.2* 7.3* 7.6*  PHOS 9.1*  10.0* 8.0*  --   --   --  5.9*   < > = values in this interval not displayed.   Recent Labs  Lab 04/30/20 0403 05/01/20 0410 05/03/20 0327 05/04/20 0101 05/05/20 0733  WBC 2.0*   < > 4.8 4.6 6.0  NEUTROABS 1.9  --   --   --   --   HGB 8.0*   < > 7.8* 8.3* 8.1*  HCT 24.2*   < > 22.9* 23.8* 23.6*  MCV 90.0   < > 86.7 86.5 87.4  PLT 174   < > 149* 150 181   < > = values in this interval not displayed.

## 2020-05-05 NOTE — Progress Notes (Signed)
PROGRESS NOTE  [name] KZL:[id number] DOB: 02-12-1971 DOA: 04/27/2020 PCP: [name], MD   LOS: 8 days   Brief narrative:  [name] is a 50 y.o. male with past medical history of lupus, chronic pancytopenia, B12 deficiency, hypothyroidism presented to the hospital with shortness of breath, abdominal distention and swelling of the legs for 3 weeks.  Patient does have history of lupus, was seen by rheumatologist 3 to 4 months ago but stopped taking Imuran by himself. In the ED, patient was to have severe abdominal distention as well as leg swelling.  CT scan of the chest and abdomen showed evidence of ascites as well as anasarca.  Work-up also showed hypoalbuminemia and significant AKI.  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, suppressed C3 and C4, weakly positive ASO, negative anti-GBM -Nephrology following -Started on high-dose IV Solu-Medrol, now prednisone 60 mg and high-dose Lasix  -started hemodialysis 1/4 -Underwent kidney biopsy in IR 1/4, complicated by large retroperitoneal hematoma -Also underwent paracentesis on 1/2 with 2 L drained followed by 1/4, 5 L drained -Remains volume overloaded, continue HD per nephrology, clip for outpatient dialysis started  Acute blood loss anemia Large retroperitoneal hematoma -Following kidney biopsy, CT scan revealed large 11 x 6 cm hematoma -1 unit of PRBC transfused 1/5, hemoglobin relatively stable,  -Hemoglobin 8.1 this morning  Hyponatremia.  -Secondary to hypervolemia, monitor with HD  History of lupus, pleurisy, pericardial effusion -  2D echocardiogram from 04/28/2020 showed LV ejection fraction of 55 to 60% with moderate LVH but  no pericardial effusion but trace pleural effusion.  Followed by Dr. Kathlene November in the past.  Currently on high-dose prednisone  Bladder pain likely spasms.   -after Foley catheter placement.  Improved.  On flavoxate, oxycodone  History of Lupus. Noncompliant with medication regimen in the past.  Will need continued follow-up as outpatient with rheumatologist.  Chronic pancytopenia/ leukopenia. -Likely secondary to lupus, was seen by oncology in the past and had bone marrow biopsy.    Hypothyroidism TSH of 7.8. Continue Synthroid.  Repeat her thyroid function as outpatient.   DVT prophylaxis: SCDs due to large retroperitoneal hematoma Code Status: Full code Family Communication: None Status is: Inpatient  Remains inpatient appropriate because:IV treatments appropriate due to intensity of illness or inability to take PO and Inpatient level of care appropriate due to severity of illness, severe AKI with fluid overload  Dispo: The patient is from: Home              Anticipated d/c is to: Home              Anticipated d/c date is: >3days              Patient currently is not medically stable to d/c.   Consultants: Nephrology IR  Procedures:  Paracentesis 1/2 and 1/4  Renal biopsy 1/4  Antibiotics:  . None  Anti-infectives (From admission, onward)   Start     Dose/Rate Route Frequency Ordered Stop   05/03/20 1230  ceFAZolin (ANCEF) IVPB 2g/100 mL premix  Status:  Discontinued        2 g 200 mL/hr over 30 Minutes Intravenous  Once 05/03/20 1132 05/03/20 1132   05/03/20 1230  ceFAZolin (ANCEF) IVPB 2g/100 mL premix  2 g 200 mL/hr over 30 Minutes Intravenous On call 05/03/20 1132 05/03/20 1259   05/03/20 1015  sulfamethoxazole-trimethoprim (BACTRIM DS) 800-160 MG per tablet 1 tablet        1 tablet Oral Once per day on Mon Wed Fri 05/03/20 1829       Subjective: -No events overnight, feels little better overall, swelling starting to improve, still with abdominal wall  tightness and leg swelling  Objective: Vitals:   05/05/20 0032 05/05/20 0333  BP: 126/90 (!) 129/92  Pulse: 73 70  Resp: 15 11  Temp: 98.1 F (36.7 C) 98.1 F (36.7 C)  SpO2: 98% 97%    Intake/Output Summary (Last 24 hours) at 05/05/2020 0939 Last data filed at 05/05/2020 0852 Gross per 24 hour  Intake 846 ml  Output 2740 ml  Net -1894 ml   Filed Weights   05/03/20 0529 05/04/20 0403 05/05/20 0333  Weight: 86.6 kg 84.1 kg 82.9 kg   Body mass index is 25.49 kg/m.   Physical Exam:  General: Pleasant male sitting up in bed, AAOx3, no distress HEENT: Right IJ HD catheter noted CVS: S1-S2, regular rate rhythm Lungs: Decreased breath sounds at the bases Abdomen: Soft, nontender, abdominal wall edema Extremities: 2+ edema  Skin: No rashes on exposed skin  Data Review: I have personally reviewed the following laboratory data and studies,  CBC: Recent Labs  Lab 04/30/20 0403 05/01/20 0410 05/02/20 0500 05/02/20 1320 05/03/20 0327 05/04/20 0101 05/05/20 0733  WBC 2.0*   < > 4.8 5.0 4.8 4.6 6.0  NEUTROABS 1.9  --   --   --   --   --   --   HGB 8.0*   < > 7.6* 7.7* 7.8* 8.3* 8.1*  HCT 24.2*   < > 21.6* 22.5* 22.9* 23.8* 23.6*  MCV 90.0   < > 86.1 85.2 86.7 86.5 87.4  PLT 174   < > 160 143* 149* 150 181   < > = values in this interval not displayed.   Basic Metabolic Panel: Recent Labs  Lab 04/29/20 0445 04/30/20 0403 05/01/20 0410 05/02/20 0500 05/03/20 0327 05/04/20 0101 05/05/20 0737  NA 133* 131*  131* 132* 130* 133* 132* 133*  K 5.4* 6.3*  6.3* 4.7 4.8 3.9 4.3 3.8  CL 112* 108  108 105 102 103 100 98  CO2 12* 12*  11* 20* 18* 23 20* 26  GLUCOSE 90 146*  146* 175* 157* 135* 172* 123*  BUN 85* 90*  89* 66* 88* 63* 81* 53*  CREATININE 4.83* 5.54*  5.58* 5.12* 6.18* 4.88* 5.67* 4.64*  CALCIUM 7.7* 7.8*  7.8* 7.4* 7.3* 7.2* 7.3* 7.6*  MG  --  2.4 2.0  --   --   --   --   PHOS 9.3* 9.1*  10.0* 8.0*  --   --   --  5.9*   Liver Function  Tests: Recent Labs  Lab 04/30/20 0403 05/01/20 0410 05/02/20 0500 05/03/20 0327 05/05/20 0737  AST _0 --   ALT _1 --   ALKPHOS 52 52 61 64  --   BILITOT 0.4 0.7 0.5 0.4  --   PROT 5.5* 4.8* 4.7* 4.6*  --   ALBUMIN 1.8*  1.8* 1.5* 1.5* 1.5* 2.0*   No results for input(s): LIPASE, AMYLASE in the last 168 hours. No results for input(s): AMMONIA in the last 168 hours. Cardiac Enzymes: No results for input(s): CKTOTAL, CKMB, CKMBINDEX, TROPONINI in  the last 168 hours. BNP (last 3 results) Recent Labs    04/27/20 2016  BNP 36.5    ProBNP (last 3 results) Recent Labs    11/06/19 1613  PROBNP 337.0*    CBG: Recent Labs  Lab 04/30/20 1056  GLUCAP 148*   Recent Results (from the past 240 hour(s))  Resp Panel by RT-PCR (Flu A&B, Covid) Nasopharyngeal Swab     Status: None   Collection Time: 04/27/20  8:22 PM   Specimen: Nasopharyngeal Swab; Nasopharyngeal(NP) swabs in vial transport medium  Result Value Ref Range Status   SARS Coronavirus 2 by RT PCR NEGATIVE NEGATIVE Final    Comment: (NOTE) SARS-CoV-2 target nucleic acids are NOT DETECTED.  The SARS-CoV-2 RNA is generally detectable in upper respiratory specimens during the acute phase of infection. The lowest concentration of SARS-CoV-2 viral copies this assay can detect is 138 copies/mL. A negative result does not preclude SARS-Cov-2 infection and should not be used as the sole basis for treatment or other patient management decisions. A negative result may occur with  improper specimen collection/handling, submission of specimen other than nasopharyngeal swab, presence of viral mutation(s) within the areas targeted by this assay, and inadequate number of viral copies(<138 copies/mL). A negative result must be combined with clinical observations, patient history, and epidemiological information. The expected result is Negative.  Fact Sheet for Patients:   EntrepreneurPulse.com.au  Fact Sheet for Healthcare Providers:  IncredibleEmployment.be  This test is no t yet approved or cleared by the [place] FDA and  has been authorized for detection and/or diagnosis of SARS-CoV-2 by FDA under an Emergency Use Authorization (EUA). This EUA will remain  in effect (meaning this test can be used) for the duration of the COVID-19 declaration under Section 564(b)(1) of the Act, 21 U.S.C.section 360bbb-3(b)(1), unless the authorization is terminated  or revoked sooner.       Influenza A by PCR NEGATIVE NEGATIVE Final   Influenza B by PCR NEGATIVE NEGATIVE Final    Comment: (NOTE) The Xpert Xpress SARS-CoV-2/FLU/RSV plus assay is intended as an aid in the diagnosis of influenza from Nasopharyngeal swab specimens and should not be used as a sole basis for treatment. Nasal washings and aspirates are unacceptable for Xpert Xpress SARS-CoV-2/FLU/RSV testing.  Fact Sheet for Patients: EntrepreneurPulse.com.au  Fact Sheet for Healthcare Providers: IncredibleEmployment.be  This test is not yet approved or cleared by the [place] FDA and has been authorized for detection and/or diagnosis of SARS-CoV-2 by FDA under an Emergency Use Authorization (EUA). This EUA will remain in effect (meaning this test can be used) for the duration of the COVID-19 declaration under Section 564(b)(1) of the Act, 21 U.S.C. section 360bbb-3(b)(1), unless the authorization is terminated or revoked.  Performed at [facility], [place] [address] [place], [place] [address]   Culture, Urine     Status: Abnormal   Collection Time: 04/28/20 11:27 AM   Specimen: Urine, Clean Catch  Result Value Ref Range Status   Specimen Description   Final    URINE, CLEAN CATCH Performed at [facility], [place] [address] [place], [place] [address]    Special Requests    Final    NONE Performed at [facility], [place] [address], [place], [place] [address]    Culture >=100,000 COLONIES/mL STAPHYLOCOCCUS AUREUS (A)  Final   Report Status 04/30/2020 FINAL  Final   Organism ID, Bacteria STAPHYLOCOCCUS AUREUS (A)  Final      Susceptibility  Staphylococcus aureus - MIC*    CIPROFLOXACIN <=0.5 SENSITIVE Sensitive     GENTAMICIN <=0.5 SENSITIVE Sensitive     NITROFURANTOIN <=16 SENSITIVE Sensitive     OXACILLIN <=0.25 SENSITIVE Sensitive     TETRACYCLINE <=1 SENSITIVE Sensitive     VANCOMYCIN 1 SENSITIVE Sensitive     TRIMETH/SULFA <=10 SENSITIVE Sensitive     CLINDAMYCIN <=0.25 SENSITIVE Sensitive     RIFAMPIN <=0.5 SENSITIVE Sensitive     Inducible Clindamycin NEGATIVE Sensitive     * >=100,000 COLONIES/mL STAPHYLOCOCCUS AUREUS  Culture, body fluid-bottle     Status: None   Collection Time: 04/28/20 11:30 AM   Specimen: Peritoneal Washings  Result Value Ref Range Status   Specimen Description PERITONEAL FLUID  Final   Special Requests NONE  Final   Culture   Final    NO GROWTH 6 DAYS Performed at [facility], [address], [place], [place] [address]    Report Status 05/04/2020 FINAL  Final  Gram stain     Status: None   Collection Time: 04/28/20 11:30 AM   Specimen: Peritoneal Washings  Result Value Ref Range Status   Specimen Description PERITONEAL FLUID  Final   Special Requests NONE  Final   Gram Stain   Final    WBC PRESENT,BOTH PMN AND MONONUCLEAR NO ORGANISMS SEEN CYTOSPIN SMEAR Performed at [facility], [place] [address], [place], [place] [address]    Report Status 04/28/2020 FINAL  Final  MRSA PCR Screening     Status: None   Collection Time: 04/30/20  5:59 PM   Specimen: Nasal Mucosa; Nasopharyngeal  Result Value Ref Range Status   MRSA by PCR NEGATIVE NEGATIVE Final    Comment:        The GeneXpert MRSA Assay (FDA approved for NASAL specimens only), is one component of a comprehensive MRSA  colonization surveillance program. It is not intended to diagnose MRSA infection nor to guide or monitor treatment for MRSA infections. Performed at [facility], [place] [address] [place], [place] [address]      Studies: IR US Guide Vasc Access Right  Result Date: 05/03/2020 INDICATION: Acute kidney injury, access for dialysis EXAM: ULTRASOUND GUIDANCE FOR VASCULAR ACCESS RIGHT INTERNAL JUGULAR PERMANENT HEMODIALYSIS CATHETER Date:  05/03/2020 05/03/2020 4:16 pm Radiologist:  Jerilynn Mages. Daryll Brod, MD Guidance:  Ultrasound and fluoroscopic FLUOROSCOPY TIME:  Fluoroscopy Time: 1 minutes 0 seconds (17 mGy). MEDICATIONS: Ancef 2 g administered prior to the procedure ANESTHESIA/SEDATION: Versed 100 mg IV; Fentanyl 2.0 mcg IV; Moderate Sedation Time:  24 minutes The patient was continuously monitored during the procedure by the interventional radiology nurse under my direct supervision. CONTRAST:  None. COMPLICATIONS: None immediate. PROCEDURE: Informed consent was obtained from the patient following explanation of the procedure, risks, benefits and alternatives. The patient understands, agrees and consents for the procedure. All questions were addressed. A time out was performed. Maximal barrier sterile technique utilized including caps, mask, sterile gowns, sterile gloves, large sterile drape, hand hygiene, and 2% chlorhexidine scrub. Under sterile conditions and local anesthesia, right internal jugular micropuncture venous access was performed with ultrasound. Images were obtained for documentation of the patent right internal jugular vein. A guide wire was inserted followed by a transitional dilator. Next, a 0.035 guidewire was advanced into the IVC with a 5-French catheter. Measurements were obtained from the right venotomy site to the proximal right atrium. In the right infraclavicular chest, a subcutaneous tunnel was created under sterile conditions and local anesthesia.  1% lidocaine with epinephrine was  utilized for this. The 19 cm tip to cuff palindrome catheter was tunneled subcutaneously to the venotomy site and inserted into the SVC/RA junction through a valved peel-away sheath. Position was confirmed with fluoroscopy. Images were obtained for documentation. Blood was aspirated from the catheter followed by saline and heparin flushes. The appropriate volume and strength of heparin was instilled in each lumen. Caps were applied. The catheter was secured at the tunnel site with Gelfoam and a pursestring suture. The venotomy site was closed with subcuticular Vicryl suture. Dermabond was applied to the small right neck incision. A dry sterile dressing was applied. The catheter is ready for use. No immediate complications. IMPRESSION: Ultrasound and fluoroscopically guided right internal jugular tunneled hemodialysis catheter (19 cm tip to cuff palindrome catheter). Electronically Signed   By: Jerilynn Mages.  Shick M.D.   On: 05/03/2020 16:33   IR TUNNELED CENTRAL VENOUS CATHETER PLACEMENT  Result Date: 05/03/2020 INDICATION: Acute kidney injury, access for dialysis EXAM: ULTRASOUND GUIDANCE FOR VASCULAR ACCESS RIGHT INTERNAL JUGULAR PERMANENT HEMODIALYSIS CATHETER Date:  05/03/2020 05/03/2020 4:16 pm Radiologist:  Jerilynn Mages. Daryll Brod, MD Guidance:  Ultrasound and fluoroscopic FLUOROSCOPY TIME:  Fluoroscopy Time: 1 minutes 0 seconds (17 mGy). MEDICATIONS: Ancef 2 g administered prior to the procedure ANESTHESIA/SEDATION: Versed 100 mg IV; Fentanyl 2.0 mcg IV; Moderate Sedation Time:  24 minutes The patient was continuously monitored during the procedure by the interventional radiology nurse under my direct supervision. CONTRAST:  None. COMPLICATIONS: None immediate. PROCEDURE: Informed consent was obtained from the patient following explanation of the procedure, risks, benefits and alternatives. The patient understands, agrees and consents for the procedure. All questions were addressed. A time out was performed. Maximal barrier  sterile technique utilized including caps, mask, sterile gowns, sterile gloves, large sterile drape, hand hygiene, and 2% chlorhexidine scrub. Under sterile conditions and local anesthesia, right internal jugular micropuncture venous access was performed with ultrasound. Images were obtained for documentation of the patent right internal jugular vein. A guide wire was inserted followed by a transitional dilator. Next, a 0.035 guidewire was advanced into the IVC with a 5-French catheter. Measurements were obtained from the right venotomy site to the proximal right atrium. In the right infraclavicular chest, a subcutaneous tunnel was created under sterile conditions and local anesthesia. 1% lidocaine with epinephrine was utilized for this. The 19 cm tip to cuff palindrome catheter was tunneled subcutaneously to the venotomy site and inserted into the SVC/RA junction through a valved peel-away sheath. Position was confirmed with fluoroscopy. Images were obtained for documentation. Blood was aspirated from the catheter followed by saline and heparin flushes. The appropriate volume and strength of heparin was instilled in each lumen. Caps were applied. The catheter was secured at the tunnel site with Gelfoam and a pursestring suture. The venotomy site was closed with subcuticular Vicryl suture. Dermabond was applied to the small right neck incision. A dry sterile dressing was applied. The catheter is ready for use. No immediate complications. IMPRESSION: Ultrasound and fluoroscopically guided right internal jugular tunneled hemodialysis catheter (19 cm tip to cuff palindrome catheter). Electronically Signed   By: Jerilynn Mages.  Shick M.D.   On: 05/03/2020 16:33    Domenic Polite, MD  [facility] 05/05/2020

## 2020-05-06 DIAGNOSIS — I5031 Acute diastolic (congestive) heart failure: Secondary | ICD-10-CM | POA: Diagnosis not present

## 2020-05-06 LAB — BASIC METABOLIC PANEL
Anion gap: 11 (ref 5–15)
BUN: 73 mg/dL — ABNORMAL HIGH (ref 6–20)
CO2: 23 mmol/L (ref 22–32)
Calcium: 7.5 mg/dL — ABNORMAL LOW (ref 8.9–10.3)
Chloride: 97 mmol/L — ABNORMAL LOW (ref 98–111)
Creatinine, Ser: 5.93 mg/dL — ABNORMAL HIGH (ref 0.61–1.24)
GFR, Estimated: 11 mL/min — ABNORMAL LOW (ref >60)
Glucose, Bld: 124 mg/dL — ABNORMAL HIGH (ref 70–99)
Potassium: 3.6 mmol/L (ref 3.5–5.1)
Sodium: 131 mmol/L — ABNORMAL LOW (ref 135–145)

## 2020-05-06 LAB — CBC
HCT: 26.1 % — ABNORMAL LOW (ref 39.0–52.0)
Hemoglobin: 8.8 g/dL — ABNORMAL LOW (ref 13.0–17.0)
MCH: 29.8 pg (ref 26.0–34.0)
MCHC: 33.7 g/dL (ref 30.0–36.0)
MCV: 88.5 fL (ref 80.0–100.0)
Platelets: 220 10*3/uL (ref 150–400)
RBC: 2.95 MIL/uL — ABNORMAL LOW (ref 4.22–5.81)
RDW: 12.3 % (ref 11.5–15.5)
WBC: 8 10*3/uL (ref 4.0–10.5)
nRBC: 0 % (ref 0.0–0.2)

## 2020-05-06 MED ORDER — HEPARIN SODIUM (PORCINE) 1000 UNIT/ML IJ SOLN
INTRAMUSCULAR | Status: AC
  Filled 2020-05-06: qty 4

## 2020-05-06 MED ORDER — ALBUMIN HUMAN 25 % IV SOLN: 50.0000 g | Freq: Once | INTRAVENOUS | Status: DC

## 2020-05-06 NOTE — Progress Notes (Signed)
Admit: 04/27/2020 LOS: 9  9M hx/o SLE off IS presenting with AKI, hypervolemia/ascites, hematuria, proteinuria  Subjective:  . No acute events, HD today. Reports constipation, has not had a BM since being here.  . Path tissue misdirected to [facility] Path, not [facility]; Local path is working to get to [facility]   01/09 0701 - 01/10 0700 In: 373 [P.O.:370; I.V.:3] Out: -   Filed Weights   05/04/20 0403 05/05/20 0333 05/06/20 0527  Weight: 84.1 kg 82.9 kg 83.9 kg    Scheduled Meds: . (feeding supplement) PROSource Plus  30 mL Oral TID BM  . sodium chloride   Intravenous Once  . Chlorhexidine Gluconate Cloth  6 each Topical Daily  . darbepoetin (ARANESP) injection - DIALYSIS  60 mcg Intravenous Q Thu-HD  . feeding supplement (NEPRO CARB STEADY)  237 mL Oral Q24H  . folic acid  1 mg Oral Daily  . kidney failure book   Does not apply Once  . levothyroxine  50 mcg Oral Q0600  . multivitamin  1 tablet Oral QHS  . mycophenolate  500 mg Oral BID  . polyethylene glycol  17 g Oral BID  . predniSONE  60 mg Oral Q breakfast  . sodium chloride flush  3 mL Intravenous Q12H  . sulfamethoxazole-trimethoprim  1 tablet Oral Once per day on Mon Wed Fri  . vitamin B-12  1,000 mcg Oral Daily   Continuous Infusions: . sodium chloride     PRN Meds:.sodium chloride, acetaminophen, bisacodyl, flavoxATE, ondansetron (ZOFRAN) IV, oxyCODONE-acetaminophen, sodium chloride flush  Current Labs: reviewed  Results for BROCKTON, MCKESSON (MRN [id number]) as of 04/29/2020 13:40 Hepatitis B Surface Ag Latest Ref Range: NON REACTIVE  NON REACTIVE    Ref. Range 04/28/2020 19:21  HCV Ab Latest Ref Range: NON REACTIVE  NON REACTIVE   Results for KOHEI, ANTONELLIS (MRN [id number]) as of 04/29/2020 13:40 IV Screen 4th Generation wRfx  Latest Ref Range: Non Reactive  Non Reactive   Results for JADIEN, LEHIGH (MRN [id number]) as of 04/30/2020 14:03  Ref. Range 04/28/2020 19:21  ds DNA Ab Latest Ref Range: 0 - 9 IU/mL >300 (H)    C4 5,  C3 47  Results for HOLMAN, BONSIGNORE (MRN [id number]) as of 05/03/2020 09:26  Ref. Range 05/02/2020 13:05  Saturation Ratios Latest Ref Range: 17.9 - 39.5 % 93 (H)  Ferritin Latest Ref Range: 24 - 336 ng/mL 564 (H)    Physical Exam:  Blood pressure (!) 136/96, pulse 80, temperature 97.8 F (36.6 C), temperature source Oral, resp. rate 15, height 5\' 11"  (1.803 m), weight 83.9 kg, SpO2 97 %. Gen: NAD in bed  CV: RRR no rub Resp: normal wob MSK: 3+ pitting edema b/l LE's Abd: distended/nontender, soft HEENT: NCAT, EOMI Access: R IJ Tunneled HD Cath  A 1. AKI/RPGN with hematuria/proteinuria and hx/o #2;  1. hypocomplementemia with inc dsDNA and +ANA; neg AntiGBM; ANCA neg 2. Started HD 04/30/20 3. Renal Bx 04/30/20 c/b perinephric hematoma and ABLA, see #9 4. Temp HD cath 04/30/20, then tunneled on 1/7 5. S/p solumedrol 500mg  IV x3d 04/29/20-05/01/20 --> Pred 60mg /d for now 6. Very high supsicion for Class III or more likley IV, +/- V LN; await prelim Bx but was not sent to [facility] for nephropathology, local path group working on this; not sure how much info we can expect from [facility] since they will have to work with processed tissue.  7. 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  Cont pred with taper in time.  Cont TIW TMP/SMX 8. Need to complete outpt AKI CLIP anticipating prolonged HD dependence 9. PT/OT 2. Hx/o SLE, followed by [facility], prev on imuran off sev mo, hx/o pleuritis/pericarditis 3. Ascites/Hypervolemia likely related #1 and #4; TTE w/ nL LVEF and R sided function 1. LVP 2L 1/2 and then 5L on 1/4  2. Hopefully can manage vol with HD 4. Hypoalbuminemia; have used bolus albumin in HD to augment UF 5. Pancytopenia; mild/improved 6. Hyperkalemia, mild, resolved with HD 7. Metabolic Acidosis, NAG related to #1, improved with HD 8. Hyperphosphatemia, trend with HD 9. Anemia, AoC, 1.7 drop in Hb in 24h post bx, imaging with hematoma,  stabilized and hemodynamics stable; IR following.  On ESA; High TSAT no role for IV Fe 10. Hypervolemic hyponatremia  P . HD today: 4h, TDC 3K, no heparin 3-4L UF, IVB albumin . c/w pred 60mg /d, taper after 45m, taper to 40mg  on 06/02/20 . C/w MMF 500 BID . F/u info from [place] . He declined COVID19 Vaccine, did just rec monoclonal Ab infusion as a close contact.  . Daily weights, Daily Renal Panel, Strict I/Os, Avoid nephrotoxins (NSAIDs, judicious IV Contrast)  Gean Quint, MD [facility] 05/06/2020, 9:56 AM  Recent Labs  Lab 04/30/20 0403 05/01/20 0410 05/02/20 0500 05/04/20 0101 05/05/20 0737 05/06/20 0649  NA 131*  131* 132*   < > 132* 133* 131*  K 6.3*  6.3* 4.7   < > 4.3 3.8 3.6  CL 108  108 105   < > 100 98 97*  CO2 12*  11* 20*   < > 20* 26 23  GLUCOSE 146*  146* 175*   < > 172* 123* 124*  BUN 90*  89* 66*   < > 81* 53* 73*  CREATININE 5.54*  5.58* 5.12*   < > 5.67* 4.64* 5.93*  CALCIUM 7.8*  7.8* 7.4*   < > 7.3* 7.6* 7.5*  PHOS 9.1*  10.0* 8.0*  --   --  5.9*  --    < > = values in this interval not displayed.   Recent Labs  Lab 04/30/20 0403 05/01/20 0410 05/04/20 0101 05/05/20 0733 05/06/20 0649  WBC 2.0*   < > 4.6 6.0 8.0  NEUTROABS 1.9  --   --   --   --   HGB 8.0*   < > 8.3* 8.1* 8.8*  HCT 24.2*   < > 23.8* 23.6* 26.1*  MCV 90.0   < > 86.5 87.4 88.5  PLT 174   < > 150 181 220   < > = values in this interval not displayed.

## 2020-05-06 NOTE — Progress Notes (Signed)
PT Cancellation Note  Patient Details Name: [name] MRN: [id number] DOB: 1971/01/04   Cancelled Treatment:    Reason Eval/Treat Not Completed: (P) Patient at procedure or test/unavailable Pt is off the floor in HD. PT will follow back for treatment tomorrow.  Candelario Steppe B. [name] PT, DPT Acute Rehabilitation Services Pager [phone] Office [phone]    [place] 05/06/2020, 3:52 PM

## 2020-05-06 NOTE — Progress Notes (Signed)
Nutrition Follow-up  DOCUMENTATION CODES:   Non-severe (moderate) malnutrition in context of chronic illness  INTERVENTION:   - Recommend increasing bowel regimen as pt has not had a BM since 05/02/20  - Recommend liberalizing diet to allow for more food options  - Continue ProSource Plus 30 ml TID, each supplement provides 100 kcal and 15 grams of protein  - Continue Nepro Shake po daily, each supplement provides 425 kcal and 19 grams protein  - Continue renal MVI daily  - Pt uncomfortable and experiencing nausea at time of RD visit. RD will monitor for ability to provide renal diet education at follow-up.  NUTRITION DIAGNOSIS:   Moderate Malnutrition related to chronic illness (lupus) as evidenced by mild fat depletion,mild muscle depletion,moderate fat depletion,moderate muscle depletion.  Ongoing  GOAL:   Patient will meet greater than or equal to 90% of their needs  Unmet at this time  MONITOR:   PO intake,Supplement acceptance,Labs,Weight trends,I & O's  REASON FOR ASSESSMENT:   Malnutrition Screening Tool,Consult Assessment of nutrition requirement/status  ASSESSMENT:   50 y.o. male with medical history of lupus (not on taking any medications), chronic pancytopenia, vitamin B12 deficiency, and hypothyroidism. He presented to the ED with SOB, abdominal distention, and BLE swelling x3 weeks. In the ED, CT chest and abdomen showed ascites and anasarca. Workup revealed hypoalbuminemia and significant AKI.  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 Plan for HD today. Last HD was on 1/08 with 2590 ml net UF.  Spoke with pt at bedside. Pt extremely uncomfortable due to ascites. He states that his abdominal swelling is causing him to have severe nausea. He was able to eat a small amount for breakfast but is not going to  be able to eat any lunch. He was unable to take his ProSource Plus and Nepro supplements today.  Pt confirms that he has not had a BM since admission. Recommend increasing bowel regimen.  Discussed pt with RN. Per RN, pt not able to take all of his medications due to nausea.  Discussed pt with MD. Repeat paracentesis is being considered.  Admit weight: 90.2 kg Current weight: 83.9 kg  Meal Completion: 50-80% x last 4 documented meals  Medications reviewed and include: ProSource Plus TID, aranesp, Nepro daily, folic acid, rena-vit, cellcept, miralax BID, prednisone, vitamin B-12  Labs reviewed: sodium 131, phosphorus 5.9,  Hemoglobin 8.8  Diet Order:   Diet Order            Diet renal with fluid restriction Fluid restriction: 1200 mL Fluid; Room service appropriate? Yes; Fluid consistency: Thin  Diet effective now                 EDUCATION NEEDS:   Education needs have been addressed  Skin:  Skin Assessment: Reviewed RN Assessment (blister to left leg, weeping of BLE)  Last BM:  05/02/20  Height:   Ht Readings from Last 1 Encounters:  04/29/20 5\' 11"  (1.803 m)    Weight:   Wt Readings from Last 1 Encounters:  05/06/20 82.8 kg    BMI:  Body mass index is 25.46 kg/m.  Estimated Nutritional Needs:   Kcal:  2100-2300 kcal  Protein:  100-115 grams  Fluid:  >/= 1 L/day    Gustavus Bryant, MS, RD, LDN Inpatient Clinical Dietitian Please see AMiON for contact information.

## 2020-05-06 NOTE — Progress Notes (Addendum)
PROGRESS NOTE  [name] CBJ:[id number] DOB: Jul 26, 1970 DOA: 04/27/2020 PCP: [name], MD   LOS: 9 days  Brief narrative: [name] is a 50 y.o. male with past medical history of lupus, chronic pancytopenia, B12 deficiency, hypothyroidism presented to the hospital with shortness of breath, abdominal distention and swelling of the legs for 3 weeks.  Patient does have history of lupus, was seen by rheumatologist 3 to 4 months ago but stopped taking Imuran by himself. In the ED, patient was to have severe abdominal distention as well as leg swelling.  CT scan of the chest and abdomen showed evidence of ascites as well as anasarca.  Work-up also showed hypoalbuminemia and significant AKI.  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, suppressed C3 and C4, weakly positive ASO, negative anti-GBM -Nephrology following -Started on high-dose IV Solu-Medrol, now prednisone 60 mg and high-dose Lasix, Started MMF 1/9 -started hemodialysis 1/4 -Underwent kidney biopsy in IR 1/4, complicated by large retroperitoneal hematoma -Also underwent paracentesis on 1/2 with 2 L drained followed by 1/4, 5 L drained -Remains volume overloaded, continue HD per nephrology, clip for outpatient dialysis started -Repeat paracentesis -Path tissues unfortunately misdirected to [facility] instead of [facility], currently works working on getting this to [facility] pathology  Acute blood loss anemia Large retroperitoneal hematoma -Following kidney biopsy, CT scan revealed large 11 x 6 cm hematoma -1 unit of PRBC transfused 1/5, hemoglobin relatively stable,  -Hemoglobin up to 8.8 this morning  Hyponatremia.  -Secondary to hypervolemia,  improving with HD  History of lupus, pleurisy, pericardial effusion -  2D echocardiogram from 04/28/2020 showed LV ejection fraction of 55 to 60% with moderate LVH but no pericardial effusion but trace pleural effusion.  Followed by Dr. Kathlene November in the past.  Currently on high-dose prednisone  Bladder pain likely spasms.   -after Foley catheter placement. -Now Foley discontinued  History of Lupus. Noncompliant with medication regimen in the past.  Will need continued follow-up as outpatient with rheumatologist.  Chronic pancytopenia/ leukopenia. -Likely secondary to lupus, was seen by oncology in the past and had bone marrow biopsy.    Hypothyroidism TSH of 7.8. Continue Synthroid.  Repeat her thyroid function as outpatient.   DVT prophylaxis: SCDs due to large retroperitoneal hematoma Code Status: Full code Family Communication: None Status is: Inpatient  Remains inpatient appropriate because:IV treatments appropriate due to intensity of illness or inability to take PO and Inpatient level of care appropriate due to severity of illness, severe AKI with fluid overload  Dispo: The patient is from: Home              Anticipated d/c is to: Home              Anticipated d/c date is: >3days              Patient currently is not medically stable to d/c.   Consultants: Nephrology IR  Procedures:  Paracentesis 1/2 and 1/4  Renal biopsy 1/4   Anti-infectives (From admission, onward)   Start     Dose/Rate Route Frequency Ordered Stop   05/03/20 1230  ceFAZolin (ANCEF) IVPB 2g/100 mL premix  Status:  Discontinued        2 g 200 mL/hr over 30 Minutes Intravenous  Once 05/03/20 1132  05/03/20 1132   05/03/20 1230  ceFAZolin (ANCEF) IVPB 2g/100 mL premix        2 g 200 mL/hr over 30 Minutes Intravenous On call 05/03/20 1132 05/03/20 1259   05/03/20 1015  sulfamethoxazole-trimethoprim (BACTRIM DS) 800-160 MG per tablet 1 tablet        1 tablet Oral Once per day on Mon Wed Fri 05/03/20  7564       Subjective: -No complaints at this time, breathing overall okay, reports abdominal distention and swelling Objective: Vitals:   05/06/20 0527 05/06/20 0741  BP: (!) 132/98 (!) 136/96  Pulse:  80  Resp: 19 15  Temp: 98 F (36.7 C) 97.8 F (36.6 C)  SpO2:  97%   No intake or output data in the 24 hours ending 05/06/20 1012 Filed Weights   05/04/20 0403 05/05/20 0333 05/06/20 0527  Weight: 84.1 kg 82.9 kg 83.9 kg   Body mass index is 25.8 kg/m.   Physical Exam:  General: Pleasant male sitting up in bed, AAOx3, no distress HEENT: Right IJ HD catheter noted CVS: S1-S2, regular rate rhythm Lungs: Decreased breath sounds at both bases Abdomen: Soft, nontender, bowel sounds present, abdominal wall edema noted Extremities: 2+ edema Skin: No rashes on exposed skin   Data Review: I have personally reviewed the following laboratory data and studies,  CBC: Recent Labs  Lab 04/30/20 0403 05/01/20 0410 05/02/20 1320 05/03/20 0327 05/04/20 0101 05/05/20 0733 05/06/20 0649  WBC 2.0*   < > 5.0 4.8 4.6 6.0 8.0  NEUTROABS 1.9  --   --   --   --   --   --   HGB 8.0*   < > 7.7* 7.8* 8.3* 8.1* 8.8*  HCT 24.2*   < > 22.5* 22.9* 23.8* 23.6* 26.1*  MCV 90.0   < > 85.2 86.7 86.5 87.4 88.5  PLT 174   < > 143* 149* 150 181 220   < > = values in this interval not displayed.   Basic Metabolic Panel: Recent Labs  Lab 04/30/20 0403 05/01/20 0410 05/02/20 0500 05/03/20 0327 05/04/20 0101 05/05/20 0737 05/06/20 0649  NA 131*  131* 132* 130* 133* 132* 133* 131*  K 6.3*  6.3* 4.7 4.8 3.9 4.3 3.8 3.6  CL 108  108 105 102 103 100 98 97*  CO2 12*  11* 20* 18* 23 20* 26 23  GLUCOSE 146*  146* 175* 157* 135* 172* 123* 124*  BUN 90*  89* 66* 88* 63* 81* 53* 73*  CREATININE 5.54*  5.58* 5.12* 6.18* 4.88* 5.67* 4.64* 5.93*  CALCIUM 7.8*  7.8* 7.4* 7.3* 7.2* 7.3* 7.6* 7.5*  MG 2.4 2.0  --   --   --   --   --   PHOS 9.1*  10.0* 8.0*  --   --   --  5.9*  --    Liver  Function Tests: Recent Labs  Lab 04/30/20 0403 05/01/20 0410 05/02/20 0500 05/03/20 0327 05/05/20 0737  AST _0 --   ALT _1 --   ALKPHOS 52 52 61 64  --   BILITOT 0.4 0.7 0.5 0.4  --   PROT 5.5* 4.8* 4.7* 4.6*  --   ALBUMIN 1.8*  1.8* 1.5* 1.5* 1.5* 2.0*   No results for input(s): LIPASE, AMYLASE in the last 168 hours. No results for input(s): AMMONIA in the last 168 hours. Cardiac Enzymes: No results for input(s): CKTOTAL, CKMB, CKMBINDEX, TROPONINI in the  last 168 hours. BNP (last 3 results) Recent Labs    04/27/20 2016  BNP 36.5    ProBNP (last 3 results) Recent Labs    11/06/19 1613  PROBNP 337.0*    CBG: Recent Labs  Lab 04/30/20 1056  GLUCAP 148*   Recent Results (from the past 240 hour(s))  Resp Panel by RT-PCR (Flu A&B, Covid) Nasopharyngeal Swab     Status: None   Collection Time: 04/27/20  8:22 PM   Specimen: Nasopharyngeal Swab; Nasopharyngeal(NP) swabs in vial transport medium  Result Value Ref Range Status   SARS Coronavirus 2 by RT PCR NEGATIVE NEGATIVE Final    Comment: (NOTE) SARS-CoV-2 target nucleic acids are NOT DETECTED.  The SARS-CoV-2 RNA is generally detectable in upper respiratory specimens during the acute phase of infection. The lowest concentration of SARS-CoV-2 viral copies this assay can detect is 138 copies/mL. A negative result does not preclude SARS-Cov-2 infection and should not be used as the sole basis for treatment or other patient management decisions. A negative result may occur with  improper specimen collection/handling, submission of specimen other than nasopharyngeal swab, presence of viral mutation(s) within the areas targeted by this assay, and inadequate number of viral copies(<138 copies/mL). A negative result must be combined with clinical observations, patient history, and epidemiological information. The expected result is Negative.  Fact Sheet for Patients:   EntrepreneurPulse.com.au  Fact Sheet for Healthcare Providers:  IncredibleEmployment.be  This test is no t yet approved or cleared by the [place] FDA and  has been authorized for detection and/or diagnosis of SARS-CoV-2 by FDA under an Emergency Use Authorization (EUA). This EUA will remain  in effect (meaning this test can be used) for the duration of the COVID-19 declaration under Section 564(b)(1) of the Act, 21 U.S.C.section 360bbb-3(b)(1), unless the authorization is terminated  or revoked sooner.       Influenza A by PCR NEGATIVE NEGATIVE Final   Influenza B by PCR NEGATIVE NEGATIVE Final    Comment: (NOTE) The Xpert Xpress SARS-CoV-2/FLU/RSV plus assay is intended as an aid in the diagnosis of influenza from Nasopharyngeal swab specimens and should not be used as a sole basis for treatment. Nasal washings and aspirates are unacceptable for Xpert Xpress SARS-CoV-2/FLU/RSV testing.  Fact Sheet for Patients: EntrepreneurPulse.com.au  Fact Sheet for Healthcare Providers: IncredibleEmployment.be  This test is not yet approved or cleared by the [place] FDA and has been authorized for detection and/or diagnosis of SARS-CoV-2 by FDA under an Emergency Use Authorization (EUA). This EUA will remain in effect (meaning this test can be used) for the duration of the COVID-19 declaration under Section 564(b)(1) of the Act, 21 U.S.C. section 360bbb-3(b)(1), unless the authorization is terminated or revoked.  Performed at [facility], [place] [address], [place], [place] [address]   Culture, Urine     Status: Abnormal   Collection Time: 04/28/20 11:27 AM   Specimen: Urine, Clean Catch  Result Value Ref Range Status   Specimen Description   Final    URINE, CLEAN CATCH Performed at [facility], [place] [address], [place], [place] [address]    Special Requests    Final    NONE Performed at [facility], [place] [address] [place], [place] [address]    Culture >=100,000 COLONIES/mL STAPHYLOCOCCUS AUREUS (A)  Final   Report Status 04/30/2020 FINAL  Final   Organism ID, Bacteria STAPHYLOCOCCUS AUREUS (A)  Final      Susceptibility   Staphylococcus  aureus - MIC*    CIPROFLOXACIN <=0.5 SENSITIVE Sensitive     GENTAMICIN <=0.5 SENSITIVE Sensitive     NITROFURANTOIN <=16 SENSITIVE Sensitive     OXACILLIN <=0.25 SENSITIVE Sensitive     TETRACYCLINE <=1 SENSITIVE Sensitive     VANCOMYCIN 1 SENSITIVE Sensitive     TRIMETH/SULFA <=10 SENSITIVE Sensitive     CLINDAMYCIN <=0.25 SENSITIVE Sensitive     RIFAMPIN <=0.5 SENSITIVE Sensitive     Inducible Clindamycin NEGATIVE Sensitive     * >=100,000 COLONIES/mL STAPHYLOCOCCUS AUREUS  Culture, body fluid-bottle     Status: None   Collection Time: 04/28/20 11:30 AM   Specimen: Peritoneal Washings  Result Value Ref Range Status   Specimen Description PERITONEAL FLUID  Final   Special Requests NONE  Final   Culture   Final    NO GROWTH 6 DAYS Performed at [facility], [address] [place], [place] [address]    Report Status 05/04/2020 FINAL  Final  Gram stain     Status: None   Collection Time: 04/28/20 11:30 AM   Specimen: Peritoneal Washings  Result Value Ref Range Status   Specimen Description PERITONEAL FLUID  Final   Special Requests NONE  Final   Gram Stain   Final    WBC PRESENT,BOTH PMN AND MONONUCLEAR NO ORGANISMS SEEN CYTOSPIN SMEAR Performed at [facility], [place] [address], [place], [place] [address]    Report Status 04/28/2020 FINAL  Final  MRSA PCR Screening     Status: None   Collection Time: 04/30/20  5:59 PM   Specimen: Nasal Mucosa; Nasopharyngeal  Result Value Ref Range Status   MRSA by PCR NEGATIVE NEGATIVE Final    Comment:        The GeneXpert MRSA Assay (FDA approved for NASAL specimens only), is one component of a comprehensive MRSA  colonization surveillance program. It is not intended to diagnose MRSA infection nor to guide or monitor treatment for MRSA infections. Performed at [facility], [place] [address], [place], [place] [address]      Studies: No results found.  Domenic Polite, MD  [facility] 05/06/2020

## 2020-05-06 NOTE — Plan of Care (Signed)

## 2020-05-07 ENCOUNTER — Inpatient Hospital Stay (HOSPITAL_COMMUNITY): Payer: BC Managed Care – PPO

## 2020-05-07 DIAGNOSIS — I5031 Acute diastolic (congestive) heart failure: Secondary | ICD-10-CM | POA: Diagnosis not present

## 2020-05-07 HISTORY — PX: IR PARACENTESIS: IMG2679

## 2020-05-07 LAB — BASIC METABOLIC PANEL
Anion gap: 11 (ref 5–15)
BUN: 53 mg/dL — ABNORMAL HIGH (ref 6–20)
CO2: 25 mmol/L (ref 22–32)
Calcium: 7.7 mg/dL — ABNORMAL LOW (ref 8.9–10.3)
Chloride: 97 mmol/L — ABNORMAL LOW (ref 98–111)
Creatinine, Ser: 5.03 mg/dL — ABNORMAL HIGH (ref 0.61–1.24)
GFR, Estimated: 13 mL/min — ABNORMAL LOW (ref >60)
Glucose, Bld: 102 mg/dL — ABNORMAL HIGH (ref 70–99)
Potassium: 3.7 mmol/L (ref 3.5–5.1)
Sodium: 133 mmol/L — ABNORMAL LOW (ref 135–145)

## 2020-05-07 LAB — CBC
HCT: 27.3 % — ABNORMAL LOW (ref 39.0–52.0)
Hemoglobin: 8.9 g/dL — ABNORMAL LOW (ref 13.0–17.0)
MCH: 29.3 pg (ref 26.0–34.0)
MCHC: 32.6 g/dL (ref 30.0–36.0)
MCV: 89.8 fL (ref 80.0–100.0)
Platelets: 193 10*3/uL (ref 150–400)
RBC: 3.04 MIL/uL — ABNORMAL LOW (ref 4.22–5.81)
RDW: 12.6 % (ref 11.5–15.5)
WBC: 10 10*3/uL (ref 4.0–10.5)
nRBC: 0 % (ref 0.0–0.2)

## 2020-05-07 MED ORDER — SENNOSIDES-DOCUSATE SODIUM 8.6-50 MG PO TABS
1.0000 | ORAL_TABLET | Freq: Two times a day (BID) | ORAL | Status: DC
  Administered 2020-05-07 – 2020-05-10 (×6): 1 via ORAL
  Filled 2020-05-07 (×7): qty 1

## 2020-05-07 MED ORDER — LIDOCAINE HCL 1 % IJ SOLN
INTRAMUSCULAR | Status: AC
  Filled 2020-05-07: qty 20

## 2020-05-07 MED ORDER — LIDOCAINE HCL 1 % IJ SOLN
INTRAMUSCULAR | Status: DC | PRN
  Administered 2020-05-07: 10 mL

## 2020-05-07 NOTE — Progress Notes (Signed)
Patient continues to refuse tap water enema.

## 2020-05-07 NOTE — Procedures (Signed)
PROCEDURE SUMMARY:  Successful image-guided paracentesis from the right lateral abdomen.  Yielded 4.0 liters of clear yellow fluid.  No immediate complications.  EBL = 0 mL. Patient tolerated well.   Specimen was not sent for labs.  Please see imaging section of Epic for full dictation.   Claris Pong Rihan Schueler PA-C 05/07/2020 9:26 AM

## 2020-05-07 NOTE — Progress Notes (Signed)
Admit: 04/27/2020 LOS: 10  40M hx/o SLE off IS presenting with AKI, hypervolemia/ascites, hematuria, proteinuria  Subjective:  . No acute events, tolerated HD, net uf 1953cc. No BM yet  . Path tissue misdirected to [facility], not [facility]; Has been redirected, awaiting results   01/10 0701 - 01/11 0700 In: 250 [P.O.:240; I.V.:10] Out: 1953   Filed Weights   05/06/20 1434 05/06/20 1749 05/07/20 0419  Weight: 82.8 kg 80.8 kg 83.2 kg    Scheduled Meds: . (feeding supplement) PROSource Plus  30 mL Oral TID BM  . sodium chloride   Intravenous Once  . Chlorhexidine Gluconate Cloth  6 each Topical Daily  . darbepoetin (ARANESP) injection - DIALYSIS  60 mcg Intravenous Q Thu-HD  . feeding supplement (NEPRO CARB STEADY)  237 mL Oral Q24H  . folic acid  1 mg Oral Daily  . kidney failure book   Does not apply Once  . levothyroxine  50 mcg Oral Q0600  . multivitamin  1 tablet Oral QHS  . mycophenolate  500 mg Oral BID  . polyethylene glycol  17 g Oral BID  . predniSONE  60 mg Oral Q breakfast  . sodium chloride flush  3 mL Intravenous Q12H  . sulfamethoxazole-trimethoprim  1 tablet Oral Once per day on Mon Wed Fri  . vitamin B-12  1,000 mcg Oral Daily   Continuous Infusions: . sodium chloride    . albumin human     PRN Meds:.sodium chloride, acetaminophen, bisacodyl, ondansetron (ZOFRAN) IV, oxyCODONE-acetaminophen, sodium chloride flush  Current Labs: reviewed  Results for SAHID, BORBA (MRN [id number]) as of 04/29/2020 13:40 Hepatitis B Surface Ag Latest Ref Range: NON REACTIVE  NON REACTIVE    Ref. Range 04/28/2020 19:21  HCV Ab Latest Ref Range: NON REACTIVE  NON REACTIVE   Results for UZIEL, COVAULT (MRN [id number]) as of 04/29/2020 13:40 IV Screen 4th Generation wRfx  Latest Ref Range: Non Reactive  Non Reactive   Results for DAKWAN, PRIDGEN (MRN [id number]) as of 04/30/2020 14:03  Ref. Range 04/28/2020 19:21  ds DNA Ab Latest Ref Range: 0 - 9 IU/mL >300 (H)    C4 5, C3  47  Results for THOMPSON, MCKIM (MRN [id number]) as of 05/03/2020 09:26  Ref. Range 05/02/2020 13:05  Saturation Ratios Latest Ref Range: 17.9 - 39.5 % 93 (H)  Ferritin Latest Ref Range: 24 - 336 ng/mL 564 (H)    Physical Exam:  Blood pressure 120/84, pulse 76, temperature 98.1 F (36.7 C), temperature source Oral, resp. rate 16, height 5\' 11"  (1.803 m), weight 83.2 kg, SpO2 98 %. Gen: NAD in bed  CV: RRR no rub Resp: normal wob MSK: 3+ pitting edema b/l LE's Abd: distended/nontender, soft HEENT: NCAT, EOMI Access: R IJ Tunneled HD Cath  A 1. AKI/RPGN with hematuria/proteinuria and hx/o #2;  1. hypocomplementemia with inc dsDNA and +ANA; neg AntiGBM; ANCA neg 2. Started HD 04/30/20 3. Renal Bx 04/30/20 c/b perinephric hematoma and ABLA, see #9 4. Temp HD cath 04/30/20, then tunneled on 1/7 5. S/p solumedrol 500mg  IV x3d 04/29/20-05/01/20 --> Pred 60mg /d for now 6. Very high supsicion for Class III or more likley IV, +/- V LN; await prelim Bx but was not sent to [facility] for nephropathology, local path group working on this; not sure how much info we can expect from [facility] since they will have to work with processed tissue.  7. Given high suspcion of SLE nephritis with RPGN, will start MMF 500 BID and  taper up to goal dosing.  Cont pred with taper in time.  Cont TIW TMP/SMX 8. Need to complete outpt AKI CLIP anticipating prolonged HD dependence 9. PT/OT 2. Hx/o SLE, followed by [facility], prev on imuran off sev mo, hx/o pleuritis/pericarditis 3. Ascites/Hypervolemia likely related #1 and #4; TTE w/ nL LVEF and R sided function 1. LVP 2L 1/2 and then 5L on 1/4  2. Hopefully can manage vol with HD 4. Hypoalbuminemia; have used bolus albumin in HD to augment UF 5. Pancytopenia; mild/improved 6. Hyperkalemia, mild, resolved with HD 7. Metabolic Acidosis, NAG related to #1, improved with HD 8. Hyperphosphatemia, trend with HD 9. Anemia, AoC, 1.7 drop in Hb in 24h post bx, imaging with hematoma, stabilized and  hemodynamics stable; IR following.  On ESA; High TSAT no role for IV Fe 10. Hypervolemic hyponatremia  P . HD tomorrow: 4h, TDC 3K, no heparin 3-4L UF, IVB albumin . c/w pred 60mg /d, taper after 74m, taper to 40mg  on 06/02/20 . C/w MMF 500 BID . F/u info from [place] . He declined COVID19 Vaccine, did just rec monoclonal Ab infusion as a close contact.  . Daily weights, Daily Renal Panel, Strict I/Os, Avoid nephrotoxins (NSAIDs, judicious IV Contrast)  Gean Quint, MD [facility] 05/07/2020, 9:00 AM  Recent Labs  Lab 05/01/20 0410 05/02/20 0500 05/05/20 0737 05/06/20 0649 05/07/20 0546  NA 132*   < > 133* 131* 133*  K 4.7   < > 3.8 3.6 3.7  CL 105   < > 98 97* 97*  CO2 20*   < > 26 23 25   GLUCOSE 175*   < > 123* 124* 102*  BUN 66*   < > 53* 73* 53*  CREATININE 5.12*   < > 4.64* 5.93* 5.03*  CALCIUM 7.4*   < > 7.6* 7.5* 7.7*  PHOS 8.0*  --  5.9*  --   --    < > = values in this interval not displayed.   Recent Labs  Lab 05/05/20 0733 05/06/20 0649 05/07/20 0546  WBC 6.0 8.0 10.0  HGB 8.1* 8.8* 8.9*  HCT 23.6* 26.1* 27.3*  MCV 87.4 88.5 89.8  PLT 181 220 193

## 2020-05-07 NOTE — Progress Notes (Signed)
PT Cancellation Note  Patient Details Name: [name] MRN: [id number] DOB: [birth year]   Cancelled Treatment:    Reason Eval/Treat Not Completed: (P) Patient declined, no reason specified Pt reports that he has just gotten pain medication and is finally comfortable in bed and does not want to walk right now. PT will try to coordinate with HD schedule tomorrow.  Ellin Fitzgibbons B. [name] PT, DPT Acute Rehabilitation Services Pager [phone] Office [phone]  [place] 05/07/2020, 2:59 PM

## 2020-05-07 NOTE — Progress Notes (Addendum)
PROGRESS NOTE  [name] WCB:[id number] DOB: 01-15-1971 DOA: 04/27/2020 PCP: [name], MD   LOS: 10 days  Brief narrative: [name] is a 50 y.o. male with past medical history of lupus, chronic pancytopenia, B12 deficiency, hypothyroidism presented to the hospital with shortness of breath, abdominal distention and swelling of the legs for 3 weeks.  Patient does have history of lupus, was seen by rheumatologist 3 to 4 months ago but stopped taking Imuran by himself. In the ED, patient was to have severe abdominal distention as well as leg swelling.  CT scan of the chest and abdomen showed evidence of ascites as well as anasarca.  Work-up also showed hypoalbuminemia and severe AKI.  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, suppressed C3 and C4, weakly positive ASO, negative anti-GBM -Nephrology following -Started on high-dose IV Solu-Medrol, now prednisone 60 mg and high-dose Lasix, Started MMF 1/9 -started hemodialysis 1/4 -Underwent kidney biopsy in IR 1/4, complicated by large retroperitoneal hematoma -Also underwent paracentesis on 1/2 with 2 L drained followed by 1/4, 5 L drained -Remains volume overloaded, continue HD per nephrology, clip for outpatient dialysis started -Repeat paracentesis today, 4L drained, will likely need freq paracentesis in the near future -Path tissues unfortunately misdirected to [facility] instead of [facility], now working on getting this to [facility] pathology  Acute blood loss anemia Large retroperitoneal hematoma -Following kidney biopsy, CT scan revealed large 11 x 6 cm hematoma -1 unit of PRBC transfused 1/5, -Hemoglobin stable  now  Hyponatremia.  -Secondary to hypervolemia, improving with HD  History of lupus, pleurisy, pericardial effusion -  2D echocardiogram from 04/28/2020 showed LV ejection fraction of 55 to 60% with moderate LVH but no pericardial effusion but trace pleural effusion.  Followed by Dr. Kathlene November in the past.  Currently on high-dose prednisone  Constipation -despite laxatives, refused enema yesterday, will try today  History of Lupus. Noncompliant with medication regimen in the past.   -Will need continued follow-up as outpatient with rheumatologist.  Chronic pancytopenia/ leukopenia. -Likely secondary to lupus, was seen by oncology in the past and had bone marrow biopsy.    Hypothyroidism TSH of 7.8. Continue Synthroid.  Repeat thyroid function as outpatient.   DVT prophylaxis: SCDs due to large retroperitoneal hematoma Code Status: Full code Family Communication: sister at bedside Status is: Inpatient  Remains inpatient appropriate because:IV treatments appropriate due to intensity of illness or inability to take PO and Inpatient level of care appropriate due to severity of illness, severe AKI with fluid overload  Dispo: The patient is from: Home              Anticipated d/c is to: Home              Anticipated d/c date is: >3days              Patient currently is not medically stable to d/c.   Consultants: Nephrology IR  Procedures:  Paracentesis 1/2, 1/4 and 1/11  Renal biopsy 1/4   Anti-infectives (From admission, onward)   Start     Dose/Rate Route Frequency Ordered Stop   05/03/20 1230  ceFAZolin (ANCEF) IVPB 2g/100 mL premix  Status:  Discontinued        2 g 200 mL/hr over 30  Minutes Intravenous  Once 05/03/20 1132 05/03/20 1132   05/03/20 1230  ceFAZolin (ANCEF) IVPB 2g/100 mL premix        2 g 200 mL/hr over 30 Minutes Intravenous On call 05/03/20 1132 05/03/20 1259   05/03/20 1015  sulfamethoxazole-trimethoprim (BACTRIM DS) 800-160 MG per tablet 1 tablet         1 tablet Oral Once per day on Mon Wed Fri 05/03/20 0928       Subjective: -back from paracentesis, abd feels better, no Bm yet, refused enema yesterday  Objective: Vitals:   05/07/20 0900 05/07/20 1125  BP: 125/90 118/84  Pulse:  86  Resp:  15  Temp:  98.2 F (36.8 C)  SpO2:  96%    Intake/Output Summary (Last 24 hours) at 05/07/2020 1511 Last data filed at 05/07/2020 1429 Gross per 24 hour  Intake 580 ml  Output 2078 ml  Net -1498 ml   Filed Weights   05/06/20 1434 05/06/20 1749 05/07/20 0419  Weight: 82.8 kg 80.8 kg 83.2 kg   Body mass index is 25.58 kg/m.   Physical Exam:  General: pleasant, chronically ill male, sitting up in bed, AAOx3 HEENT: + JVD, R IJ HD catheter noted CVS: S1S2/RRR Lungs: decreased BS at bases, few rales Abd: firm, distended, BS present, + fluid thrill Extremities: 2+ edema Skin: No rashes on exposed skin   Data Review: I have personally reviewed the following laboratory data and studies,  CBC: Recent Labs  Lab 05/03/20 0327 05/04/20 0101 05/05/20 0733 05/06/20 0649 05/07/20 0546  WBC 4.8 4.6 6.0 8.0 10.0  HGB 7.8* 8.3* 8.1* 8.8* 8.9*  HCT 22.9* 23.8* 23.6* 26.1* 27.3*  MCV 86.7 86.5 87.4 88.5 89.8  PLT 149* 150 181 220 163   Basic Metabolic Panel: Recent Labs  Lab 05/01/20 0410 05/02/20 0500 05/03/20 0327 05/04/20 0101 05/05/20 0737 05/06/20 0649 05/07/20 0546  NA 132*   < > 133* 132* 133* 131* 133*  K 4.7   < > 3.9 4.3 3.8 3.6 3.7  CL 105   < > 103 100 98 97* 97*  CO2 20*   < > 23 20* _0 GLUCOSE 175*   < > 135* 172* 123* 124* 102*  BUN 66*   < > 63* 81* 53* 73* 53*  CREATININE 5.12*   < > 4.88* 5.67* 4.64* 5.93* 5.03*  CALCIUM 7.4*   < > 7.2* 7.3* 7.6* 7.5* 7.7*  MG 2.0  --   --   --   --   --   --   PHOS 8.0*  --   --   --  5.9*  --   --    < > = values in this interval not displayed.   Liver Function Tests: Recent Labs  Lab 05/01/20 0410 05/02/20 0500 05/03/20 0327 05/05/20 0737  AST _1 --    ALT _2 --   ALKPHOS 52 61 64  --   BILITOT 0.7 0.5 0.4  --   PROT 4.8* 4.7* 4.6*  --   ALBUMIN 1.5* 1.5* 1.5* 2.0*   No results for input(s): LIPASE, AMYLASE in the last 168 hours. No results for input(s): AMMONIA in the last 168 hours. Cardiac Enzymes: No results for input(s): CKTOTAL, CKMB, CKMBINDEX, TROPONINI in the last 168 hours. BNP (last 3 results) Recent Labs    04/27/20 2016  BNP 36.5    ProBNP (last 3 results) Recent Labs    11/06/19 1613  PROBNP 337.0*    CBG: No results for input(s): GLUCAP in the last 168 hours. Recent Results (from the past 240 hour(s))  Resp Panel by RT-PCR (Flu A&B, Covid) Nasopharyngeal Swab     Status: None   Collection Time: 04/27/20  8:22 PM   Specimen: Nasopharyngeal Swab; Nasopharyngeal(NP) swabs in vial transport medium  Result Value Ref Range Status   SARS Coronavirus 2 by RT PCR NEGATIVE NEGATIVE Final    Comment: (NOTE) SARS-CoV-2 target nucleic acids are NOT DETECTED.  The SARS-CoV-2 RNA is generally detectable in upper respiratory specimens during the acute phase of infection. The lowest concentration of SARS-CoV-2 viral copies this assay can detect is 138 copies/mL. A negative result does not preclude SARS-Cov-2 infection and should not be used as the sole basis for treatment or other patient management decisions. A negative result may occur with  improper specimen collection/handling, submission of specimen other than nasopharyngeal swab, presence of viral mutation(s) within the areas targeted by this assay, and inadequate number of viral copies(<138 copies/mL). A negative result must be combined with clinical observations, patient history, and epidemiological information. The expected result is Negative.  Fact Sheet for Patients:  EntrepreneurPulse.com.au  Fact Sheet for Healthcare Providers:  IncredibleEmployment.be  This test is no t yet approved or cleared by the  [place] FDA and  has been authorized for detection and/or diagnosis of SARS-CoV-2 by FDA under an Emergency Use Authorization (EUA). This EUA will remain  in effect (meaning this test can be used) for the duration of the COVID-19 declaration under Section 564(b)(1) of the Act, 21 U.S.C.section 360bbb-3(b)(1), unless the authorization is terminated  or revoked sooner.       Influenza A by PCR NEGATIVE NEGATIVE Final   Influenza B by PCR NEGATIVE NEGATIVE Final    Comment: (NOTE) The Xpert Xpress SARS-CoV-2/FLU/RSV plus assay is intended as an aid in the diagnosis of influenza from Nasopharyngeal swab specimens and should not be used as a sole basis for treatment. Nasal washings and aspirates are unacceptable for Xpert Xpress SARS-CoV-2/FLU/RSV testing.  Fact Sheet for Patients: EntrepreneurPulse.com.au  Fact Sheet for Healthcare Providers: IncredibleEmployment.be  This test is not yet approved or cleared by the [place] FDA and has been authorized for detection and/or diagnosis of SARS-CoV-2 by FDA under an Emergency Use Authorization (EUA). This EUA will remain in effect (meaning this test can be used) for the duration of the COVID-19 declaration under Section 564(b)(1) of the Act, 21 U.S.C. section 360bbb-3(b)(1), unless the authorization is terminated or revoked.  Performed at [facility], [place] [address] [place], [place] [address]   Culture, Urine     Status: Abnormal   Collection Time: 04/28/20 11:27 AM   Specimen: Urine, Clean Catch  Result Value Ref Range Status   Specimen Description   Final    URINE, CLEAN CATCH Performed at [facility], [place] [address], [place], [place] [address]    Special Requests   Final    NONE Performed at [facility], [place] [address], [place], [place] [address]    Culture >=100,000 COLONIES/mL STAPHYLOCOCCUS AUREUS (A)  Final   Report  Status 04/30/2020 FINAL  Final   Organism ID, Bacteria STAPHYLOCOCCUS AUREUS (A)  Final      Susceptibility   Staphylococcus aureus - MIC*    CIPROFLOXACIN <=0.5 SENSITIVE Sensitive     GENTAMICIN <=0.5 SENSITIVE Sensitive     NITROFURANTOIN <=16 SENSITIVE Sensitive     OXACILLIN <=0.25 SENSITIVE  Sensitive     TETRACYCLINE <=1 SENSITIVE Sensitive     VANCOMYCIN 1 SENSITIVE Sensitive     TRIMETH/SULFA <=10 SENSITIVE Sensitive     CLINDAMYCIN <=0.25 SENSITIVE Sensitive     RIFAMPIN <=0.5 SENSITIVE Sensitive     Inducible Clindamycin NEGATIVE Sensitive     * >=100,000 COLONIES/mL STAPHYLOCOCCUS AUREUS  Culture, body fluid-bottle     Status: None   Collection Time: 04/28/20 11:30 AM   Specimen: Peritoneal Washings  Result Value Ref Range Status   Specimen Description PERITONEAL FLUID  Final   Special Requests NONE  Final   Culture   Final    NO GROWTH 6 DAYS Performed at [facility], [address], [place], [place] [address]    Report Status 05/04/2020 FINAL  Final  Gram stain     Status: None   Collection Time: 04/28/20 11:30 AM   Specimen: Peritoneal Washings  Result Value Ref Range Status   Specimen Description PERITONEAL FLUID  Final   Special Requests NONE  Final   Gram Stain   Final    WBC PRESENT,BOTH PMN AND MONONUCLEAR NO ORGANISMS SEEN CYTOSPIN SMEAR Performed at [facility], [place] [address], [place], [place] [address]    Report Status 04/28/2020 FINAL  Final  MRSA PCR Screening     Status: None   Collection Time: 04/30/20  5:59 PM   Specimen: Nasal Mucosa; Nasopharyngeal  Result Value Ref Range Status   MRSA by PCR NEGATIVE NEGATIVE Final    Comment:        The GeneXpert MRSA Assay (FDA approved for NASAL specimens only), is one component of a comprehensive MRSA colonization surveillance program. It is not intended to diagnose MRSA infection nor to guide or monitor treatment for MRSA infections. Performed at [facility], [place]  [address] [place], [place] [address]      Studies: IR Paracentesis  Result Date: 05/07/2020 INDICATION: Patient with history of SLE, AKI, acute HF in fluid overload, abdominal distension, and recurrent ascites. Request is made for therapeutic paracentesis up to 4 L. EXAM: ULTRASOUND GUIDED THERAPEUTIC PARACENTESIS MEDICATIONS: 10 mL 1% lidocaine COMPLICATIONS: None immediate. PROCEDURE: Informed written consent was obtained from the patient after a discussion of the risks, benefits and alternatives to treatment. A timeout was performed prior to the initiation of the procedure. Initial ultrasound scanning demonstrates a moderate amount of ascites within the right lower abdominal quadrant. The right lower abdomen was prepped and draped in the usual sterile fashion. 1% lidocaine was used for local anesthesia. Following this, a 19 gauge, 7-cm, Yueh catheter was introduced. An ultrasound image was saved for documentation purposes. The paracentesis was performed. The catheter was removed and a dressing was applied. The patient tolerated the procedure well without immediate post procedural complication. Patient received post-procedure intravenous albumin; see nursing notes for details. FINDINGS: A total of approximately 4 L of clear yellow fluid was removed. IMPRESSION: Successful ultrasound-guided paracentesis yielding 4 L of peritoneal fluid. Read by: Earley Abide, PA-C Electronically Signed   By: Jacqulynn Cadet M.D.   On: 05/07/2020 10:23    Domenic Polite, MD  [facility] 05/07/2020

## 2020-05-07 NOTE — Progress Notes (Signed)
   05/07/20 0800  Clinical Encounter Type  Visited With Patient  Visit Type Initial  Referral From Nurse  Consult/Referral To Chaplain  Spiritual Encounters  Spiritual Needs Literature  The chaplain responded to spiritual consultation for AD. The patient was sleep upon arrival. The chaplain spoke with unit secretary Jearld Pies) and the patient will receive an AD packet to review and jot down any questions. The chaplain will follow up as needed to answer questions and move forward with the process if the patient would like to complete the HPOA.

## 2020-05-07 NOTE — Progress Notes (Signed)
Patient has refused tap water enema, is taking stool softeners PO

## 2020-05-08 DIAGNOSIS — I5031 Acute diastolic (congestive) heart failure: Secondary | ICD-10-CM | POA: Diagnosis not present

## 2020-05-08 LAB — BASIC METABOLIC PANEL
Anion gap: 12 (ref 5–15)
BUN: 68 mg/dL — ABNORMAL HIGH (ref 6–20)
CO2: 23 mmol/L (ref 22–32)
Calcium: 7.6 mg/dL — ABNORMAL LOW (ref 8.9–10.3)
Chloride: 95 mmol/L — ABNORMAL LOW (ref 98–111)
Creatinine, Ser: 6.07 mg/dL — ABNORMAL HIGH (ref 0.61–1.24)
GFR, Estimated: 11 mL/min — ABNORMAL LOW (ref >60)
Glucose, Bld: 127 mg/dL — ABNORMAL HIGH (ref 70–99)
Potassium: 4 mmol/L (ref 3.5–5.1)
Sodium: 130 mmol/L — ABNORMAL LOW (ref 135–145)

## 2020-05-08 LAB — CBC
HCT: 28.1 % — ABNORMAL LOW (ref 39.0–52.0)
Hemoglobin: 9.3 g/dL — ABNORMAL LOW (ref 13.0–17.0)
MCH: 29.5 pg (ref 26.0–34.0)
MCHC: 33.1 g/dL (ref 30.0–36.0)
MCV: 89.2 fL (ref 80.0–100.0)
Platelets: 196 10*3/uL (ref 150–400)
RBC: 3.15 MIL/uL — ABNORMAL LOW (ref 4.22–5.81)
RDW: 13 % (ref 11.5–15.5)
WBC: 12.1 10*3/uL — ABNORMAL HIGH (ref 4.0–10.5)
nRBC: 0 % (ref 0.0–0.2)

## 2020-05-08 MED ORDER — MYCOPHENOLATE MOFETIL 250 MG PO CAPS
750.0000 mg | ORAL_CAPSULE | Freq: Two times a day (BID) | ORAL | Status: DC
  Administered 2020-05-08 – 2020-05-11 (×7): 750 mg via ORAL
  Filled 2020-05-08 (×8): qty 3

## 2020-05-08 MED ORDER — HEPARIN SODIUM (PORCINE) 1000 UNIT/ML IJ SOLN
INTRAMUSCULAR | Status: AC
  Administered 2020-05-08: 3200 [IU] via INTRAVENOUS_CENTRAL
  Filled 2020-05-08: qty 4

## 2020-05-08 MED ORDER — ALBUMIN HUMAN 25 % IV SOLN: 25.0000 g | Freq: Once | INTRAVENOUS | Status: AC

## 2020-05-08 MED ORDER — ALBUMIN HUMAN 25 % IV SOLN
INTRAVENOUS | Status: AC
  Administered 2020-05-08: 25 g via INTRAVENOUS
  Filled 2020-05-08: qty 100

## 2020-05-08 MED ORDER — SORBITOL 70 % SOLN
960.0000 mL | TOPICAL_OIL | Freq: Once | ORAL | Status: DC
  Filled 2020-05-08 (×2): qty 473

## 2020-05-08 MED ORDER — LACTULOSE 10 GM/15ML PO SOLN
20.0000 g | Freq: Two times a day (BID) | ORAL | Status: DC
  Administered 2020-05-08 – 2020-05-10 (×4): 20 g via ORAL
  Filled 2020-05-08 (×6): qty 30

## 2020-05-08 NOTE — Progress Notes (Addendum)
PROGRESS NOTE  [name] TSV:[id number] DOB: November 19, 1970 DOA: 04/27/2020 PCP: [name], MD   LOS: 11 days  Brief narrative: [name] is a 50 y.o. male with past medical history of lupus, chronic pancytopenia, B12 deficiency, hypothyroidism presented to the hospital with shortness of breath, abdominal distention and swelling of the legs for 3 weeks.  Patient does have history of lupus, was seen by rheumatologist 3 to 4 months ago but stopped taking Imuran by himself. In the ED, patient was to have severe abdominal distention as well as leg swelling.  CT scan of the chest and abdomen showed evidence of ascites as well as anasarca.  Work-up also showed hypoalbuminemia and severe AKI.  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, suppressed C3 and C4, weakly positive ASO, negative anti-GBM -Nephrology following -Started on high-dose IV Solu-Medrol, now prednisone 60 mg and high-dose Lasix, Started MMF 1/9 -started hemodialysis 1/4 -Underwent kidney biopsy in IR 1/4, complicated by large retroperitoneal hematoma -Also underwent paracentesis on 1/2 with 2 L drained followed by 1/4, 5 L drained, last paracentesis yesterday 1/11-4 L drained -Remains clinically volume overloaded, anticipate he will need frequent paracentesis in the near future, nephrology following, plan to pull off more fluid today as tolerated -Path tissues unfortunately misdirected to [facility] instead of [facility], now working on getting this to [facility] pathology  Acute blood loss anemia Large retroperitoneal hematoma -Following kidney biopsy, CT scan revealed large 11 x 6 cm  hematoma -1 unit of PRBC transfused 1/5, -Hemoglobin stable now  Hyponatremia.  -Secondary to hypervolemia, improving with HD  History of lupus, pleurisy, pericardial effusion -  2D echocardiogram from 04/28/2020 showed LV ejection fraction of 55 to 60% with moderate LVH but no pericardial effusion but trace pleural effusion.  Followed by Dr. Kathlene November in the past.  Currently on high-dose prednisone  Constipation -Multifactorial, continue laxatives, refused enema again yesterday  -Add lactulose, reattempt enema today  History of Lupus. Noncompliant with medication regimen in the past.   -Will need continued follow-up as outpatient with rheumatologist.  Chronic pancytopenia/ leukopenia. -Likely secondary to lupus, was seen by oncology in the past and had bone marrow biopsy.    Hypothyroidism TSH of 7.8. Continue Synthroid.  Repeat thyroid function as outpatient.   DVT prophylaxis: SCDs due to large retroperitoneal hematoma Code Status: Full code Family Communication: Discussed with sister 1/11 Status is: Inpatient  Remains inpatient appropriate because:IV treatments appropriate due to intensity of illness or inability to take PO and Inpatient level of care appropriate due to severity of illness, severe AKI with fluid overload  Dispo: The patient is from: Home              Anticipated d/c is to: Home              Anticipated d/c date is: >3days              Patient currently is not medically stable to d/c.   Consultants: Nephrology IR  Procedures:  Paracentesis 1/2, 1/4 and 1/11  Renal biopsy 1/4   Anti-infectives (From admission, onward)   Start     Dose/Rate Route Frequency Ordered Stop   05/03/20 1230  ceFAZolin (ANCEF) IVPB 2g/100 mL premix  Status:  Discontinued        2 g 200 mL/hr over 30 Minutes Intravenous  Once 05/03/20 1132 05/03/20 1132   05/03/20 1230  ceFAZolin (ANCEF) IVPB 2g/100 mL premix        2 g 200 mL/hr over 30 Minutes Intravenous On call 05/03/20  1132 05/03/20 1259   05/03/20 1015  sulfamethoxazole-trimethoprim (BACTRIM DS) 800-160 MG per tablet 1 tablet        1 tablet Oral Once per day on Mon Wed Fri 05/03/20 0928       Subjective: -Belly feels better overall but did have an episode of vomiting earlier this morning, oral intake remains poor, refused enema again yesterday  Objective: Vitals:   05/08/20 1040 05/08/20 1045  BP: 103/78   Pulse:    Resp: 13 13  Temp:    SpO2:      Intake/Output Summary (Last 24 hours) at 05/08/2020 1055 Last data filed at 05/07/2020 2112 Gross per 24 hour  Intake 540 ml  Output --  Net 540 ml   Filed Weights   05/06/20 1749 05/07/20 0419 05/08/20 0729  Weight: 80.8 kg 83.2 kg 78.2 kg   Body mass index is 24.04 kg/m.   Physical Exam:  General: Pleasant chronically ill male laying in bed, seen on dialysis, AAOx3 HEENT: Positive JVD, right IJ HD catheter  CVS: S1-S2, regular rate rhythm Lungs: Decreased breath sounds at the bases, few basilar rales Abdomen: Soft, less distended, positive fluid thrill, bowel sounds present Extremities: 2+ edema Skin: No rashes on exposed skin   Data Review: I have personally reviewed the following laboratory data and studies,  CBC: Recent Labs  Lab 05/04/20 0101 05/05/20 0733 05/06/20 0649 05/07/20 0546 05/08/20 0044  WBC 4.6 6.0 8.0 10.0 12.1*  HGB 8.3* 8.1* 8.8* 8.9* 9.3*  HCT 23.8* 23.6* 26.1* 27.3* 28.1*  MCV 86.5 87.4 88.5 89.8 89.2  PLT 150 181 220 193 193   Basic Metabolic Panel: Recent Labs  Lab 05/04/20 0101 05/05/20 0737 05/06/20 0649 05/07/20 0546 05/08/20 0044  NA 132* 133* 131* 133* 130*  K 4.3 3.8 3.6 3.7 4.0  CL 100 98 97* 97* 95*  CO2 20* _0 GLUCOSE 172* 123* 124* 102* 127*  BUN 81* 53* 73* 53* 68*  CREATININE 5.67* 4.64* 5.93* 5.03* 6.07*  CALCIUM 7.3* 7.6* 7.5* 7.7* 7.6*  PHOS  --  5.9*  --   --   --    Liver Function Tests: Recent Labs  Lab 05/02/20 0500 05/03/20 0327 05/05/20 0737  AST 16  18  --   ALT 18 20  --   ALKPHOS 61 64  --   BILITOT 0.5 0.4  --   PROT 4.7* 4.6*  --   ALBUMIN 1.5* 1.5* 2.0*   No results for input(s): LIPASE, AMYLASE in the last 168 hours. No results for input(s): AMMONIA in the last 168 hours. Cardiac Enzymes: No results for input(s): CKTOTAL, CKMB, CKMBINDEX, TROPONINI in the last 168 hours. BNP (last 3 results) Recent Labs    04/27/20 2016  BNP 36.5    ProBNP (last 3 results) Recent Labs    11/06/19 1613  PROBNP 337.0*    CBG: No results for input(s): GLUCAP in the last 168 hours. Recent Results (from the past 240 hour(s))  Culture, Urine     Status: Abnormal   Collection Time: 04/28/20 11:27 AM   Specimen: Urine, Clean Catch  Result Value Ref Range Status   Specimen Description  Final    URINE, CLEAN CATCH Performed at [facility], [place] [address], [place], [place] [address]    Special Requests   Final    NONE Performed at [facility], [place] [address] [place], [place] [address]    Culture >=100,000 COLONIES/mL STAPHYLOCOCCUS AUREUS (A)  Final   Report Status 04/30/2020 FINAL  Final   Organism ID, Bacteria STAPHYLOCOCCUS AUREUS (A)  Final      Susceptibility   Staphylococcus aureus - MIC*    CIPROFLOXACIN <=0.5 SENSITIVE Sensitive     GENTAMICIN <=0.5 SENSITIVE Sensitive     NITROFURANTOIN <=16 SENSITIVE Sensitive     OXACILLIN <=0.25 SENSITIVE Sensitive     TETRACYCLINE <=1 SENSITIVE Sensitive     VANCOMYCIN 1 SENSITIVE Sensitive     TRIMETH/SULFA <=10 SENSITIVE Sensitive     CLINDAMYCIN <=0.25 SENSITIVE Sensitive     RIFAMPIN <=0.5 SENSITIVE Sensitive     Inducible Clindamycin NEGATIVE Sensitive     * >=100,000 COLONIES/mL STAPHYLOCOCCUS AUREUS  Culture, body fluid-bottle     Status: None   Collection Time: 04/28/20 11:30 AM   Specimen: Peritoneal Washings  Result Value Ref Range Status   Specimen Description PERITONEAL FLUID  Final   Special Requests NONE  Final   Culture    Final    NO GROWTH 6 DAYS Performed at [place] [address] [place], [place] [address]    Report Status 05/04/2020 FINAL  Final  Gram stain     Status: None   Collection Time: 04/28/20 11:30 AM   Specimen: Peritoneal Washings  Result Value Ref Range Status   Specimen Description PERITONEAL FLUID  Final   Special Requests NONE  Final   Gram Stain   Final    WBC PRESENT,BOTH PMN AND MONONUCLEAR NO ORGANISMS SEEN CYTOSPIN SMEAR Performed at [facility], [place] [address], [place], [place] [address]    Report Status 04/28/2020 FINAL  Final  MRSA PCR Screening     Status: None   Collection Time: 04/30/20  5:59 PM   Specimen: Nasal Mucosa; Nasopharyngeal  Result Value Ref Range Status   MRSA by PCR NEGATIVE NEGATIVE Final    Comment:        The GeneXpert MRSA Assay (FDA approved for NASAL specimens only), is one component of a comprehensive MRSA colonization surveillance program. It is not intended to diagnose MRSA infection nor to guide or monitor treatment for MRSA infections. Performed at [facility], [place] [address], [place],  [address]      Studies: IR Paracentesis  Result Date: 05/07/2020 INDICATION: Patient with history of SLE, AKI, acute HF in fluid overload, abdominal distension, and recurrent ascites. Request is made for therapeutic paracentesis up to 4 L. EXAM: ULTRASOUND GUIDED THERAPEUTIC PARACENTESIS MEDICATIONS: 10 mL 1% lidocaine COMPLICATIONS: None immediate. PROCEDURE: Informed written consent was obtained from the patient after a discussion of the risks, benefits and alternatives to treatment. A timeout was performed prior to the initiation of the procedure. Initial ultrasound scanning demonstrates a moderate amount of ascites within the right lower abdominal quadrant. The right lower abdomen was prepped and draped in the usual sterile fashion. 1% lidocaine was used for local anesthesia. Following this, a 19 gauge, 7-cm, Yueh  catheter was introduced. An ultrasound image was saved for documentation purposes. The paracentesis was performed. The catheter was removed and a dressing was applied. The patient tolerated the procedure well without immediate post procedural complication. Patient received post-procedure  intravenous albumin; see nursing notes for details. FINDINGS: A total of approximately 4 L of clear yellow fluid was removed. IMPRESSION: Successful ultrasound-guided paracentesis yielding 4 L of peritoneal fluid. Read by: Earley Abide, PA-C Electronically Signed   By: Jacqulynn Cadet M.D.   On: 05/07/2020 10:23    Domenic Polite, MD  [facility] 05/08/2020

## 2020-05-08 NOTE — Progress Notes (Signed)
PT Cancellation Note  Patient Details Name: [name] MRN: [id number] DOB: [birth year]   Cancelled Treatment:    Reason Eval/Treat Not Completed: (P) Patient at procedure or test/unavailable Pt is off floor for HD. PT will follow back this afternoon as able.  Mazi Brailsford B. [name] PT, DPT Acute Rehabilitation Services Pager [phone] Office [phone]    [place] 05/08/2020, 8:28 AM

## 2020-05-08 NOTE — Progress Notes (Signed)
Physical Therapy Treatment Patient Details Name: [name] MRN: [id number] DOB: 28-Aug-1970 Today's Date: 05/08/2020    History of Present Illness 50 y.o. male with past medical history of lupus, chronic pancytopenia, B12 deficiency, hypothyroidism presented to hospital with shortness of breath abdominal distention and swelling of the legs for 3 weeks.  Patient does have history of lupus but is not taking any medication at this time.  Had been seen by rheumatologist 3 to 4 months back but stopped taking Imuran by himrself and was not on prednisone.  In the ED, patient was to have severe abdominal distention as well as leg swelling.  CT scan of the chest and abdomen showed evidence of ascites as well as anasarca.  Work-up also showed hypoalbuminemia. Transferred to [facility] to start hemodialysis 04/30/20 s/p renal biopsy resulting in retropereneal hematoma.    PT Comments    On entry pt reports that he had unwitnessed loss of balance and slid down against bed when he was trying to answer the phone. This also resulted in spilling his afternoon medication. Pt thinks he found all the pills except for the stool softener. RN made aware. Pt reports he did not realize how much his balance had been effected by being in bed and immobile for so long. Provided increased education on need for mobility especially when he feels good which was why he refused last therapy session. Pt verbalizes understanding. Pt is min guard for transfers, requiring increased effort to self steady. Ambulated with contact guard assist due to decreased steadiness with gait. Pt balance improved with distance. Pt reports no increase in pain with ambulation, so PT reiterated the need for ambulation when he is feeling well. D/c plans remain appropriate. PT will continue to follow acutely.    Follow Up Recommendations  No PT follow up     Equipment Recommendations  None recommended by PT    Recommendations for Other Services        Precautions / Restrictions Precautions Precautions: None Restrictions Weight Bearing Restrictions: No    Mobility  Bed Mobility               General bed mobility comments: sitting up in recliner  Transfers Overall transfer level: Needs assistance   Transfers: Sit to/from Stand Sit to Stand: Supervision         General transfer comment: supervision for safety  Ambulation/Gait Ambulation/Gait assistance: Min assist Gait Distance (Feet): 350 Feet Assistive device: None Gait Pattern/deviations: Step-through pattern;Wide base of support;Decreased stride length Gait velocity: slowed Gait velocity interpretation: 1.31 - 2.62 ft/sec, indicative of limited community ambulator General Gait Details: contact guard assist due to recent imbalance, slow, mildly unsteady gait         Balance Overall balance assessment: Mild deficits observed, not formally tested                                          Cognition Arousal/Alertness: Awake/alert Behavior During Therapy: WFL for tasks assessed/performed Overall Cognitive Status: Within Functional Limits for tasks assessed                                           General Comments General comments (skin integrity, edema, etc.): VSS on RA      Pertinent Vitals/Pain Pain  Assessment: Faces Faces Pain Scale: Hurts a little bit Pain Location: abdomen Pain Descriptors / Indicators: Grimacing;Guarding Pain Intervention(s): Limited activity within patient's tolerance;Monitored during session;Repositioned           PT Goals (current goals can now be found in the care plan section) Acute Rehab PT Goals PT Goal Formulation: With patient Time For Goal Achievement: 05/13/20 Potential to Achieve Goals: Good Progress towards PT goals: Progressing toward goals    Frequency    Min 3X/week      PT Plan Current plan remains appropriate       AM-PAC PT "6 Clicks" Mobility    Outcome Measure  Help needed turning from your back to your side while in a flat bed without using bedrails?: None Help needed moving from lying on your back to sitting on the side of a flat bed without using bedrails?: None Help needed moving to and from a bed to a chair (including a wheelchair)?: None Help needed standing up from a chair using your arms (e.g., wheelchair or bedside chair)?: None Help needed to walk in hospital room?: A Little Help needed climbing 3-5 steps with a railing? : A Little 6 Click Score: 22    End of Session Equipment Utilized During Treatment: Gait belt Activity Tolerance: Patient tolerated treatment well Patient left: in chair;with call bell/phone within reach Nurse Communication: Mobility status PT Visit Diagnosis: Difficulty in walking, not elsewhere classified (R26.2)     Time: 2010-0712 PT Time Calculation (min) (ACUTE ONLY): 41 min  Charges:  $Gait Training: 8-22 mins $Self Care/Home Management: 8-22                     Terryl Molinelli B. [name] PT, DPT Acute Rehabilitation Services Pager [phone] Office [phone]    [place] 05/08/2020, 5:04 PM

## 2020-05-08 NOTE — Progress Notes (Signed)
Patient has been accepted at [facility] on a TTS schedule with a seat time of 12:00pm. He needs to arrive at 11:40am to his appointments.  On his first day at the clinic, patient needs to arrive at 11:15am to complete intake paperwork. If his first day falls on a Saturday, patient will be required to report to the clinic the Friday prior (before 4:00pm) in order to sign paperwork to start treatment on a Saturday. Nephrologist and Attending updated.  Navigator attempted to call patient's room phone-no answer. Navigator left message on patient's cell phone requesting a returned call and will follow up. Navigator following for discharge information.  Alphonzo Cruise, [place] Renal Navigator [phone]

## 2020-05-08 NOTE — Progress Notes (Signed)
Admit: 04/27/2020 LOS: 49  28M hx/o SLE off IS presenting with AKI, hypervolemia/ascites, hematuria, proteinuria  Subjective:  Seen during HD. Tolerating treatment. UFG 4500. S/p para yesterday, 4L removed, feels better. Had an episode of vomiting this am and now feels much better. No other complaints. B/L LE edema stable  01/11 0701 - 01/12 0700 In: 24 [P.O.:770; I.V.:10] Out: 125 [Urine:125]  Filed Weights   05/06/20 1749 05/07/20 0419 05/08/20 0729  Weight: 80.8 kg 83.2 kg 78.2 kg    Scheduled Meds: . (feeding supplement) PROSource Plus  30 mL Oral TID BM  . sodium chloride   Intravenous Once  . Chlorhexidine Gluconate Cloth  6 each Topical Daily  . darbepoetin (ARANESP) injection - DIALYSIS  60 mcg Intravenous Q Thu-HD  . feeding supplement (NEPRO CARB STEADY)  237 mL Oral Q24H  . folic acid  1 mg Oral Daily  . kidney failure book   Does not apply Once  . levothyroxine  50 mcg Oral Q0600  . multivitamin  1 tablet Oral QHS  . mycophenolate  500 mg Oral BID  . polyethylene glycol  17 g Oral BID  . predniSONE  60 mg Oral Q breakfast  . senna-docusate  1 tablet Oral BID  . sodium chloride flush  3 mL Intravenous Q12H  . sulfamethoxazole-trimethoprim  1 tablet Oral Once per day on Mon Wed Fri  . vitamin B-12  1,000 mcg Oral Daily   Continuous Infusions: . sodium chloride    . albumin human     PRN Meds:.sodium chloride, acetaminophen, bisacodyl, lidocaine, ondansetron (ZOFRAN) IV, oxyCODONE-acetaminophen, sodium chloride flush  Current Labs: reviewed  Results for REEDY, BIERNAT (MRN [id number]) as of 04/29/2020 13:40 Hepatitis B Surface Ag Latest Ref Range: NON REACTIVE  NON REACTIVE    Ref. Range 04/28/2020 19:21  HCV Ab Latest Ref Range: NON REACTIVE  NON REACTIVE   Results for MYNOR, WITKOP (MRN [id number]) as of 04/29/2020 13:40 IV Screen 4th Generation wRfx  Latest Ref Range: Non Reactive  Non Reactive   Results for MAYSON, MCNEISH (MRN [id number]) as of 04/30/2020  14:03  Ref. Range 04/28/2020 19:21  ds DNA Ab Latest Ref Range: 0 - 9 IU/mL >300 (H)    C4 5, C3 47  Results for MADISON, ALBEA (MRN [id number]) as of 05/03/2020 09:26  Ref. Range 05/02/2020 13:05  Saturation Ratios Latest Ref Range: 17.9 - 39.5 % 93 (H)  Ferritin Latest Ref Range: 24 - 336 ng/mL 564 (H)    Physical Exam:  Blood pressure 122/86, pulse 82, temperature 98.7 F (37.1 C), temperature source Oral, resp. rate 20, height 5\' 11"  (1.803 m), weight 78.2 kg, SpO2 96 %. Gen: NAD in bed  CV: RRR no rub Resp: normal wob MSK: 3+ pitting edema b/l LE's Abd: distended/nontender, soft HEENT: NCAT, EOMI Access: R IJ Tunneled HD Cath  A 1. AKI/RPGN with hematuria/proteinuria and hx/o #2;  1. hypocomplementemia with inc dsDNA and +ANA; neg AntiGBM; ANCA neg 2. Started HD 04/30/20 3. Renal Bx 04/30/20 c/b perinephric hematoma and ABLA, see #9 4. Temp HD cath 04/30/20, then tunneled on 1/7 5. S/p solumedrol 500mg  IV x3d 04/29/20-05/01/20 --> Pred 60mg /d for now 6. Very high supsicion for Class III or more likley IV, +/- V LN; await prelim Bx but was not sent to [facility] for nephropathology, local path group working on this; not sure how much info we can expect from [facility] since they will have to work with processed tissue.  7.  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  Cont pred with taper in time.  Cont TIW TMP/SMX 8. Need to complete outpt AKI CLIP anticipating prolonged HD dependence 9. PT/OT 2. Hx/o SLE, followed by [facility], prev on imuran off sev mo, hx/o pleuritis/pericarditis 3. Ascites/Hypervolemia likely related #1 and #4; TTE w/ nL LVEF and R sided function 1. LVP 2L 1/2 and then 5L on 1/4, 4L 1/11  2. Hopefully can manage vol with HD 4. Hypoalbuminemia; have used bolus albumin in HD to augment UF 5. Pancytopenia; mild/improved 6. Hyperkalemia, mild, resolved with HD 7. Metabolic Acidosis, NAG related to #1, improved with  HD 8. Hyperphosphatemia, trend with HD 9. Anemia, AoC, 1.7 drop in Hb in 24h post bx, imaging with hematoma, stabilized and hemodynamics stable; IR following.  On ESA; High TSAT no role for IV Fe 10. Hypervolemic hyponatremia  P . HD tomorrow: 4h, TDC 3K, no heparin 3-4L UF, IV albumin . c/w pred 60mg /d, taper after 21m, taper to 40mg  on 06/02/20 . C/w MMF 500 BID, titrate to 750mg  bid . Called [facility] Pathology: did not have specimen. Called local path here, should be in process . CLIP . He declined COVID19 Vaccine, did just rec monoclonal Ab infusion as a close contact.  . Daily weights, Daily Renal Panel, Strict I/Os, Avoid nephrotoxins (NSAIDs, judicious IV Contrast)  Gean Quint, MD [facility] 05/08/2020, 7:51 AM  Recent Labs  Lab 05/05/20 0737 05/06/20 0649 05/07/20 0546 05/08/20 0044  NA 133* 131* 133* 130*  K 3.8 3.6 3.7 4.0  CL 98 97* 97* 95*  CO2 26 23 25 23   GLUCOSE 123* 124* 102* 127*  BUN 53* 73* 53* 68*  CREATININE 4.64* 5.93* 5.03* 6.07*  CALCIUM 7.6* 7.5* 7.7* 7.6*  PHOS 5.9*  --   --   --    Recent Labs  Lab 05/06/20 0649 05/07/20 0546 05/08/20 0044  WBC 8.0 10.0 12.1*  HGB 8.8* 8.9* 9.3*  HCT 26.1* 27.3* 28.1*  MCV 88.5 89.8 89.2  PLT 220 193 196

## 2020-05-09 ENCOUNTER — Inpatient Hospital Stay (HOSPITAL_COMMUNITY): Payer: BC Managed Care – PPO

## 2020-05-09 DIAGNOSIS — I5031 Acute diastolic (congestive) heart failure: Secondary | ICD-10-CM | POA: Diagnosis not present

## 2020-05-09 LAB — BASIC METABOLIC PANEL
Anion gap: 11 (ref 5–15)
BUN: 41 mg/dL — ABNORMAL HIGH (ref 6–20)
CO2: 25 mmol/L (ref 22–32)
Calcium: 7.6 mg/dL — ABNORMAL LOW (ref 8.9–10.3)
Chloride: 98 mmol/L (ref 98–111)
Creatinine, Ser: 4.52 mg/dL — ABNORMAL HIGH (ref 0.61–1.24)
GFR, Estimated: 15 mL/min — ABNORMAL LOW (ref >60)
Glucose, Bld: 151 mg/dL — ABNORMAL HIGH (ref 70–99)
Potassium: 3.6 mmol/L (ref 3.5–5.1)
Sodium: 134 mmol/L — ABNORMAL LOW (ref 135–145)

## 2020-05-09 LAB — CBC
HCT: 24.6 % — ABNORMAL LOW (ref 39.0–52.0)
Hemoglobin: 8.3 g/dL — ABNORMAL LOW (ref 13.0–17.0)
MCH: 30.2 pg (ref 26.0–34.0)
MCHC: 33.7 g/dL (ref 30.0–36.0)
MCV: 89.5 fL (ref 80.0–100.0)
Platelets: 183 10*3/uL (ref 150–400)
RBC: 2.75 MIL/uL — ABNORMAL LOW (ref 4.22–5.81)
RDW: 13.4 % (ref 11.5–15.5)
WBC: 10.4 10*3/uL (ref 4.0–10.5)
nRBC: 0 % (ref 0.0–0.2)

## 2020-05-09 NOTE — Progress Notes (Signed)
Admit: 04/27/2020 LOS: 12  47M hx/o SLE off IS presenting with AKI, hypervolemia/ascites, hematuria, proteinuria  Subjective:  No acute events, tolerated HD yesterday. Net uf 2483. Has a chair at [facility] on TTS schedule (chair time 12pm). Sister at bedside, had an extensive discussion about long term game plan.  01/12 0701 - 01/13 0700 In: 730 [P.O.:720; I.V.:10] Out: 3033 [Urine:250; Emesis/NG output:300]  Filed Weights   05/06/20 1749 05/07/20 0419 05/08/20 0729  Weight: 80.8 kg 83.2 kg 78.2 kg    Scheduled Meds: . (feeding supplement) PROSource Plus  30 mL Oral TID BM  . sodium chloride   Intravenous Once  . Chlorhexidine Gluconate Cloth  6 each Topical Daily  . darbepoetin (ARANESP) injection - DIALYSIS  60 mcg Intravenous Q Thu-HD  . feeding supplement (NEPRO CARB STEADY)  237 mL Oral Q24H  . folic acid  1 mg Oral Daily  . kidney failure book   Does not apply Once  . lactulose  20 g Oral BID  . levothyroxine  50 mcg Oral Q0600  . multivitamin  1 tablet Oral QHS  . mycophenolate  750 mg Oral BID  . polyethylene glycol  17 g Oral BID  . predniSONE  60 mg Oral Q breakfast  . senna-docusate  1 tablet Oral BID  . sodium chloride flush  3 mL Intravenous Q12H  . sorbitol, milk of mag, mineral oil, glycerin (SMOG) enema  960 mL Rectal Once  . sulfamethoxazole-trimethoprim  1 tablet Oral Once per day on Mon Wed Fri  . vitamin B-12  1,000 mcg Oral Daily   Continuous Infusions: . sodium chloride    . albumin human     PRN Meds:.sodium chloride, acetaminophen, bisacodyl, lidocaine, ondansetron (ZOFRAN) IV, oxyCODONE-acetaminophen, sodium chloride flush  Current Labs: reviewed  Results for WEST, BOOMERSHINE (MRN [id number]) as of 04/29/2020 13:40 Hepatitis B Surface Ag Latest Ref Range: NON REACTIVE  NON REACTIVE    Ref. Range 04/28/2020 19:21  HCV Ab Latest Ref Range: NON REACTIVE  NON REACTIVE   Results for ESAU, FRIDMAN (MRN [id number]) as of 04/29/2020 13:40 IV Screen 4th  Generation wRfx  Latest Ref Range: Non Reactive  Non Reactive   Results for GERAN, HAITHCOCK (MRN [id number]) as of 04/30/2020 14:03  Ref. Range 04/28/2020 19:21  ds DNA Ab Latest Ref Range: 0 - 9 IU/mL >300 (H)    C4 5, C3 47  Results for ROYAL, VANDEVOORT (MRN [id number]) as of 05/03/2020 09:26  Ref. Range 05/02/2020 13:05  Saturation Ratios Latest Ref Range: 17.9 - 39.5 % 93 (H)  Ferritin Latest Ref Range: 24 - 336 ng/mL 564 (H)    Physical Exam:  Blood pressure 119/81, pulse 87, temperature 98.2 F (36.8 C), temperature source Oral, resp. rate 20, height 5\' 11"  (1.803 m), weight 78.2 kg, SpO2 97 %. Gen: NAD in bed  CV: RRR no rub Resp: normal wob MSK: 3+ pitting edema b/l LE's Abd: distended/nontender, slightly more firm today HEENT: NCAT, EOMI Access: R IJ Tunneled HD Cath  A 1. AKI/RPGN with hematuria/proteinuria and hx/o #2;  1. hypocomplementemia with inc dsDNA and +ANA; neg AntiGBM; ANCA neg 2. Started HD 04/30/20 3. Renal Bx 04/30/20 c/b perinephric hematoma and ABLA, see #9 4. Temp HD cath 04/30/20, then tunneled on 1/7 5. S/p solumedrol 500mg  IV x3d 04/29/20-05/01/20 --> Pred 60mg /d for now 6. Very high supsicion for Class III or more likley IV, +/- V LN; await prelim Bx but was not sent to [facility] for  nephropathology, local path group working on this; not sure how much info we can expect from [facility] since they will have to work with processed tissue.  7. 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  Cont pred with taper in time.  Cont TIW TMP/SMX 8. Need to complete outpt AKI CLIP anticipating prolonged HD dependence 9. PT/OT 2. Hx/o SLE, followed by [facility], prev on imuran off sev mo, hx/o pleuritis/pericarditis 3. Ascites/Hypervolemia likely related #1 and #4; TTE w/ nL LVEF and R sided function 1. LVP 2L 1/2 and then 5L on 1/4, 4L 1/11  2. Hopefully can manage vol with HD 4. Hypoalbuminemia; have used bolus albumin in HD to augment  UF 5. Pancytopenia; mild/improved 6. Hyperkalemia, mild, resolved with HD 7. Metabolic Acidosis, NAG related to #1, improved with HD 8. Hyperphosphatemia, trend with HD 9. Anemia, AoC, 1.7 drop in Hb in 24h post bx, imaging with hematoma, stabilized and hemodynamics stable; IR following.  On ESA; High TSAT no role for IV Fe 10. Hypervolemic hyponatremia  P . HD tomorrow: 4h, TDC 3K, no heparin 3-4L UF, IV albumin. Would favor HD tomorrow if still here and then do TTS otherwise, if discharged, can do HD on sat . c/w pred 60mg /d, taper after 68m, taper to 40mg  on 06/02/20 . titrated MMF to 750mg  bid. Next step is to up-titrate to 1g BID in 1 week . Called [facility] Pathology: did not have specimen. Called local path here, should be in process . He declined COVID19 Vaccine, did just rec monoclonal Ab infusion as a close contact.  . Daily weights, Daily Renal Panel, Strict I/Os, Avoid nephrotoxins (NSAIDs, judicious IV Contrast)  Gean Quint, MD [facility] 05/09/2020, 9:26 AM  Recent Labs  Lab 05/05/20 0737 05/06/20 0649 05/07/20 0546 05/08/20 0044 05/09/20 0026  NA 133*   < > 133* 130* 134*  K 3.8   < > 3.7 4.0 3.6  CL 98   < > 97* 95* 98  CO2 26   < > 25 23 25   GLUCOSE 123*   < > 102* 127* 151*  BUN 53*   < > 53* 68* 41*  CREATININE 4.64*   < > 5.03* 6.07* 4.52*  CALCIUM 7.6*   < > 7.7* 7.6* 7.6*  PHOS 5.9*  --   --   --   --    < > = values in this interval not displayed.   Recent Labs  Lab 05/07/20 0546 05/08/20 0044 05/09/20 0026  WBC 10.0 12.1* 10.4  HGB 8.9* 9.3* 8.3*  HCT 27.3* 28.1* 24.6*  MCV 89.8 89.2 89.5  PLT 193 196 183

## 2020-05-09 NOTE — Progress Notes (Signed)
Nutrition Follow-up  DOCUMENTATION CODES:   Non-severe (moderate) malnutrition in context of chronic illness  INTERVENTION:   If PO intake does improve, recommend considering nutrition support.  - Continue ProSource Plus 30 ml TID, each supplement provides 100 kcal and 15 grams of protein  - ContinueNepro Shake podaily, each supplement provides 425 kcal and 19 grams protein  - Continue renal MVI daily  - RD will continue to follow for appropriateness of diet education. Pt endorsing severe nausea at time of RD visit with emesis bag in-hand.  NUTRITION DIAGNOSIS:   Moderate Malnutrition related to chronic illness (lupus) as evidenced by mild fat depletion,mild muscle depletion,moderate fat depletion,moderate muscle depletion.  Ongoing  GOAL:   Patient will meet greater than or equal to 90% of their needs  Unmet  MONITOR:   PO intake,Supplement acceptance,Labs,Weight trends,I & O's  REASON FOR ASSESSMENT:   Malnutrition Screening Tool,Consult Assessment of nutrition requirement/status  ASSESSMENT:   50 y.o. male with medical history of lupus (not on taking any medications), chronic pancytopenia, vitamin B12 deficiency, and hypothyroidism. He presented to the ED with SOB, abdominal distention, and BLE swelling x3 weeks. In the ED, CT chest and abdomen showed ascites and anasarca. Workup revealed hypoalbuminemia and significant AKI.  Pt's last HD treatment was yesterday (1/12) with 2483 ml net UF. Pt continues to have edema with deep pitting edema to BLE and mild pitting edema to perineal and sacral regions.  Pt with repeat paracentesis on 1/11 with 4.0 L clear yellow fluid removed.  Spoke with pt at bedside. Pt reports feeling like he needs to vomit. Emesis bag in-hand. Noted one bite of food consumed from lunch meal tray. Pt states that he felt much better after paracentesis on 1/11 but that he began feeling worse this morning after eating breakfast. Noted unopened  Nepro shake at bedside. Pt states that after he vomits, he is going to try to eat his cobbler and drink his Nepro shake.  Pt reports that he has yet to have a BM. Per notes, pt refusing enemas. Pt states that he would get the enema if it would help him.  Discussed pt with RN. RN confirms pt refusing enema. Question whether KUB would be useful in this situation to evaluate for abnormalities related to lack of BM and ongoing N/V.  Discussed pt with MD. Plan is for KUB to rule-out any GI issues.  If PO intake does not improve, need to consider nutrition support.  Meal Completion: 0-50% x last 8 documented meals  Medications reviewed and include: ProSource Plus 30 ml TID, aranesp, Nepro shake daily, folic acid, lactulose, rena-vit, cellcept, miralax, prednisone, senna, vitamin B-12  Labs reviewed: sodium 134, hemoglobin 8.3  UOP: 250 ml x 24 hours I/O's: -3.8 L since admit  Diet Order:   Diet Order            Diet renal with fluid restriction Fluid restriction: 1200 mL Fluid; Room service appropriate? Yes; Fluid consistency: Thin  Diet effective now                 EDUCATION NEEDS:   Education needs have been addressed  Skin:  Skin Assessment: Reviewed RN Assessment (blister to left leg, weeping of BLE)  Last BM:  05/02/20  Height:   Ht Readings from Last 1 Encounters:  04/29/20 5\' 11"  (1.803 m)    Weight:   Wt Readings from Last 1 Encounters:  05/08/20 78.2 kg    BMI:  Body mass index is 24.04  kg/m.  Estimated Nutritional Needs:   Kcal:  2100-2300 kcal  Protein:  100-115 grams  Fluid:  >/= 1 L/day    Gustavus Bryant, MS, RD, LDN Inpatient Clinical Dietitian Please see AMiON for contact information.

## 2020-05-09 NOTE — Progress Notes (Signed)
   05/09/20 0900  Clinical Encounter Type  Visited With Patient and family together  Visit Type Follow-up  Referral From Nurse  Consult/Referral To [facility]  The chaplain responded to page because the patient would like to have his AD notarized. The chaplain informed the patient and family there is no notary present today in the building. The notary will be in the building tomorrow. The chaplain asked that the patient page the spiritual care team if he is not discharged. If the patient is discharged he will have the papers notarized by his bank. The chaplain will follow up as needed.

## 2020-05-09 NOTE — Progress Notes (Addendum)
PROGRESS NOTE  [name]:[id number] DOB: 12-Aug-1970 DOA: 04/27/2020 PCP: [name], MD   LOS: 12 days  Brief narrative: [name] is a 50 y.o. male with past medical history of lupus, chronic pancytopenia, B12 deficiency, hypothyroidism presented to the hospital with shortness of breath, abdominal distention and swelling of the legs for 3 weeks.  Patient does have history of lupus, was seen by rheumatologist 3 to 4 months ago but stopped taking Imuran by himself. In the ED, patient was to have severe abdominal distention as well as leg swelling.  CT scan of the chest and abdomen showed evidence of ascites as well as anasarca.  Work-up also showed hypoalbuminemia and severe AKI.  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, suppressed C3 and C4, weakly positive ASO, negative anti-GBM -Nephrology following -Started on high-dose IV Solu-Medrol, now prednisone 60 mg and high-dose Lasix, Started MMF 1/9 -started hemodialysis 1/4 -Underwent kidney biopsy in IR 1/4, complicated by large retroperitoneal hematoma -Also underwent paracentesis on 1/2 with 2 L drained followed by 1/4, 5 L drained, last paracentesis yesterday 1/11-4 L drained -Remains clinically volume overloaded, anticipate he will need frequent paracentesis in the near future, nephrology following, plan to pull off more fluid today as tolerated -Path tissues unfortunately misdirected to [facility] instead of [facility], now working on getting this to [facility] pathology 1/13 Path at [facility], still pending. Nephrology following. Ascites improving per  his report but will continue monitoring for possible need of repeat therapeutic paracentesis.   Acute blood loss anemia Large retroperitoneal hematoma -Following kidney biopsy, CT scan revealed large 11 x 6 cm hematoma -1 unit of PRBC transfused 1/5, -Hemoglobin stable now  Hyponatremia.  -Secondary to hypervolemia, improving with HD  History of lupus, pleurisy, pericardial effusion -  2D echocardiogram from 04/28/2020 showed LV ejection fraction of 55 to 60% with moderate LVH but no pericardial effusion but trace pleural effusion.  Followed by Dr. Kathlene November in the past.  Currently on high-dose prednisone  Ileus v partial SBO Constipation -Multifactorial, continue laxatives, refused enema again yesterday  -Add lactulose, reattempt enema today 1/13 KUB ordered today, showed ileus. Patient with nausea and vomiting. No BM since 1/6. Now agrees to enema which has been ordered. May require several enemas. If not improving may need NG tube placement, Surgery consultation.   History of Lupus. Noncompliant with medication regimen in the past.   -Will need continued follow-up as outpatient with rheumatologist.  Chronic pancytopenia/ leukopenia. -Likely secondary to lupus, was seen by oncology in the past and had bone marrow biopsy.    Hypothyroidism TSH of 7.8. Continue Synthroid.  Repeat thyroid function as outpatient.   DVT prophylaxis: SCDs due to large retroperitoneal hematoma Code Status: Full code Family Communication: Discussed with sister in detail over the phone on 1/13 Status is: Inpatient  Remains inpatient appropriate because:IV treatments appropriate due to intensity of illness or inability to take PO and Inpatient level of care appropriate due to severity of illness, severe AKI with fluid overload  Dispo: The patient is from: Home              Anticipated d/c is to: Home  Anticipated d/c date is: >3days              Patient currently is not medically stable to d/c.    Consultants: Nephrology IR  Procedures:  Paracentesis 1/2, 1/4 and 1/11  Renal biopsy 1/4   Anti-infectives (From admission, onward)   Start     Dose/Rate Route Frequency Ordered Stop   05/03/20 1230  ceFAZolin (ANCEF) IVPB 2g/100 mL premix  Status:  Discontinued        2 g 200 mL/hr over 30 Minutes Intravenous  Once 05/03/20 1132 05/03/20 1132   05/03/20 1230  ceFAZolin (ANCEF) IVPB 2g/100 mL premix        2 g 200 mL/hr over 30 Minutes Intravenous On call 05/03/20 1132 05/03/20 1259   05/03/20 1015  sulfamethoxazole-trimethoprim (BACTRIM DS) 800-160 MG per tablet 1 tablet        1 tablet Oral Once per day on Mon Wed Fri 05/03/20 0928       Subjective: Abdominal is soft but distended, no increase in abdominal pain but has nausea, vomiting. Reports no BM in days, not sure if passing flatus.   Objective: Vitals:   05/09/20 1700 05/09/20 1934  BP: (!) 139/91 124/81  Pulse: 83 80  Resp:  15  Temp: 98.2 F (36.8 C) 98.3 F (36.8 C)  SpO2:      Intake/Output Summary (Last 24 hours) at 05/09/2020 2004 Last data filed at 05/09/2020 0730 Gross per 24 hour  Intake 610 ml  Output 250 ml  Net 360 ml   Filed Weights   05/06/20 1749 05/07/20 0419 05/08/20 0729  Weight: 80.8 kg 83.2 kg 78.2 kg   Body mass index is 24.04 kg/m.   Physical Exam:  General: Pleasant chronically ill male, sitting up in bed, in NAD HEENT: Positive JVD, right IJ HD catheter  CVS: S1 and S2 present, RRR Lungs: No cough, normal rate Abdomen: Soft, distended, bowel sounds present Extremities: 1+ edema bl LE Skin: No rashes on exposed skin   Data Review: I have personally reviewed the following laboratory data and studies reports  CBC: Recent Labs  Lab 05/05/20 0733 05/06/20 0649 05/07/20 0546 05/08/20 0044 05/09/20 0026  WBC 6.0 8.0 10.0 12.1* 10.4  HGB 8.1* 8.8* 8.9* 9.3* 8.3*  HCT 23.6* 26.1* 27.3* 28.1* 24.6*  MCV 87.4 88.5 89.8 89.2 89.5  PLT 181 220 193 196 183   Basic  Metabolic Panel: Recent Labs  Lab 05/05/20 0737 05/06/20 0649 05/07/20 0546 05/08/20 0044 05/09/20 0026  NA 133* 131* 133* 130* 134*  K 3.8 3.6 3.7 4.0 3.6  CL 98 97* 97* 95* 98  CO2 26 23 25 23 25  GLUCOSE 123* 124* 102* 127* 151*  BUN 53* 73* 53* 68* 41*  CREATININE 4.64* 5.93* 5.03* 6.07* 4.52*  CALCIUM 7.6* 7.5* 7.7* 7.6* 7.6*  PHOS 5.9*  --   --   --   --    Liver Function Tests: Recent Labs  Lab 05/03/20 0327 05/05/20 0737  AST 18  --   ALT 20  --   ALKPHOS 64  --   BILITOT 0.4  --   PROT 4.6*  --   ALBUMIN 1.5* 2.0*   No results for input(s): LIPASE, AMYLASE in the last 168 hours. No results for input(s): AMMONIA in the last 168 hours. Cardiac Enzymes: No results for input(s): CKTOTAL, CKMB, CKMBINDEX, TROPONINI in the last 168 hours. BNP (last 3 results) Recent Labs    04/27/20 2016  BNP 36.5      ProBNP (last 3 results) Recent Labs    11/06/19 1613  PROBNP 337.0*    CBG: No results for input(s): GLUCAP in the last 168 hours. Recent Results (from the past 240 hour(s))  MRSA PCR Screening     Status: None   Collection Time: 04/30/20  5:59 PM   Specimen: Nasal Mucosa; Nasopharyngeal  Result Value Ref Range Status   MRSA by PCR NEGATIVE NEGATIVE Final    Comment:        The GeneXpert MRSA Assay (FDA approved for NASAL specimens only), is one component of a comprehensive MRSA colonization surveillance program. It is not intended to diagnose MRSA infection nor to guide or monitor treatment for MRSA infections. Performed at [facility], [place] [address] [place], [place] [address]      Studies: DG Abd Portable 1V  Result Date: 05/09/2020 CLINICAL DATA:  Follow-up of ileus EXAM: PORTABLE ABDOMEN - 1 VIEW COMPARISON:  05/01/2020 CT FINDINGS: Single supine view of the abdomen and pelvis. Small bowel loops measure up to 4.5 cm, felt to be increased compared to the recent CT. No gross free intraperitoneal air. Moderate amount of ascending  colonic stool. IMPRESSION: Mid small bowel dilatation, felt to be increased compared to 05/01/2020. Ileus versus low-grade partial small bowel obstruction. Electronically Signed   By: Abigail Miyamoto M.D.   On: 05/09/2020 15:49    Time spent: 35 minutes  Blain Pais, MD  [facility] 05/09/2020

## 2020-05-09 NOTE — Plan of Care (Signed)

## 2020-05-09 NOTE — Plan of Care (Signed)
  Problem: Activity: Goal: Risk for activity intolerance will decrease 05/09/2020 1121 by Don Perking, RN Outcome: Progressing 05/09/2020 1108 by Don Perking, RN Outcome: Progressing   Problem: Nutrition: Goal: Adequate nutrition will be maintained 05/09/2020 1121 by Don Perking, RN Outcome: Progressing 05/09/2020 1108 by Don Perking, RN Outcome: Progressing   Problem: Elimination: Goal: Will not experience complications related to bowel motility 05/09/2020 1121 by Don Perking, RN Outcome: Progressing 05/09/2020 1108 by Don Perking, RN Outcome: Progressing Goal: Will not experience complications related to urinary retention 05/09/2020 1121 by Don Perking, RN Outcome: Progressing 05/09/2020 1108 by Don Perking, RN Outcome: Progressing   Problem: Pain Managment: Goal: General experience of comfort will improve 05/09/2020 1121 by Don Perking, RN Outcome: Progressing 05/09/2020 1108 by Don Perking, RN Outcome: Progressing   Problem: Safety: Goal: Ability to remain free from injury will improve 05/09/2020 1121 by Don Perking, RN Outcome: Progressing 05/09/2020 1108 by Don Perking, RN Outcome: Progressing   Problem: Skin Integrity: Goal: Risk for impaired skin integrity will decrease 05/09/2020 1121 by Don Perking, RN Outcome: Progressing 05/09/2020 1108 by Don Perking, RN Outcome: Progressing

## 2020-05-09 NOTE — Progress Notes (Signed)
Per Dr. Candiss Norse, patient is cleared for discharge from Nephrology standpoint and can start in outpatient clinic if discharged today or tomorrow (however, must sign intake papers at clinic by 4pm tomorrow to start on a Saturday). Renal Navigator attempted to contact patient again today without answer. Navigator called RN for update. She states patient appears to be doing better today than yesterday and that perhaps Navigator should contact his sister/Ms. White (in ER contact list in Epic) as she is very involved, knowledgeable and supportive. Ms. Dema Severin answered and states her brother continues to be very sleepy. She agreed to discuss outpatient HD and relay information to patient. She reports that she lives in [place] and plans to be with patient as much as possible at discharge to ensure he is supported and gets to/from HD treatments until they can determine if he needs other transportation to be arranged. Navigator informed her that they can apply for [facility] [place] in the future if needed, explained this services through [facility] and suggested to request assistance at patient's HD clinic if needed.  It is not clear at this time when patient will be discharged. Navigator spoke with Attending to request that sister be called with an update when possible.  Navigator will continue to follow.  Alphonzo Cruise, [place] Renal Navigator [phone]

## 2020-05-10 ENCOUNTER — Inpatient Hospital Stay (HOSPITAL_COMMUNITY): Payer: BC Managed Care – PPO

## 2020-05-10 DIAGNOSIS — I5031 Acute diastolic (congestive) heart failure: Secondary | ICD-10-CM | POA: Diagnosis not present

## 2020-05-10 HISTORY — PX: IR PARACENTESIS: IMG2679

## 2020-05-10 LAB — CBC
HCT: 22.9 % — ABNORMAL LOW (ref 39.0–52.0)
Hemoglobin: 7.5 g/dL — ABNORMAL LOW (ref 13.0–17.0)
MCH: 30 pg (ref 26.0–34.0)
MCHC: 32.8 g/dL (ref 30.0–36.0)
MCV: 91.6 fL (ref 80.0–100.0)
Platelets: 181 10*3/uL (ref 150–400)
RBC: 2.5 MIL/uL — ABNORMAL LOW (ref 4.22–5.81)
RDW: 13.2 % (ref 11.5–15.5)
WBC: 9.1 10*3/uL (ref 4.0–10.5)
nRBC: 0 % (ref 0.0–0.2)

## 2020-05-10 LAB — RENAL FUNCTION PANEL
Albumin: 1.9 g/dL — ABNORMAL LOW (ref 3.5–5.0)
Anion gap: 13 (ref 5–15)
BUN: 66 mg/dL — ABNORMAL HIGH (ref 6–20)
CO2: 25 mmol/L (ref 22–32)
Calcium: 7.7 mg/dL — ABNORMAL LOW (ref 8.9–10.3)
Chloride: 93 mmol/L — ABNORMAL LOW (ref 98–111)
Creatinine, Ser: 6.12 mg/dL — ABNORMAL HIGH (ref 0.61–1.24)
GFR, Estimated: 10 mL/min — ABNORMAL LOW (ref >60)
Glucose, Bld: 88 mg/dL (ref 70–99)
Phosphorus: 9 mg/dL — ABNORMAL HIGH (ref 2.5–4.6)
Potassium: 3.8 mmol/L (ref 3.5–5.1)
Sodium: 131 mmol/L — ABNORMAL LOW (ref 135–145)

## 2020-05-10 MED ORDER — ALBUMIN HUMAN 25 % IV SOLN
50.0000 g | Freq: Once | INTRAVENOUS | Status: AC
  Administered 2020-05-10: 50 g via INTRAVENOUS
  Filled 2020-05-10: qty 200

## 2020-05-10 MED ORDER — HEPARIN SODIUM (PORCINE) 1000 UNIT/ML IJ SOLN
INTRAMUSCULAR | Status: AC
  Filled 2020-05-10: qty 1

## 2020-05-10 MED ORDER — ALBUMIN HUMAN 25 % IV SOLN
INTRAVENOUS | Status: AC
  Filled 2020-05-10: qty 100

## 2020-05-10 MED ORDER — SORBITOL 70 % SOLN
960.0000 mL | TOPICAL_OIL | Freq: Once | ORAL | Status: AC
  Administered 2020-05-10: 960 mL via RECTAL
  Filled 2020-05-10: qty 473

## 2020-05-10 MED ORDER — DARBEPOETIN ALFA 60 MCG/0.3ML IJ SOSY
PREFILLED_SYRINGE | INTRAMUSCULAR | Status: AC
  Filled 2020-05-10: qty 0.3

## 2020-05-10 MED ORDER — LIDOCAINE HCL 1 % IJ SOLN
INTRAMUSCULAR | Status: AC
  Filled 2020-05-10: qty 20

## 2020-05-10 NOTE — Progress Notes (Addendum)
CSW met with pt to discuss [facility] consult. Pt already has Advanced directive paperwork. His sister is on her way to help him with it. CSW called chaplain who is aware pt needs to complete advanced directive. CSW also discussed getting a [place] for pt. Pt states CSW would need to talk with his sister regarding HH. Pt otherwise states he has ability to get meds, has transportation, and has a PCP.  38: CSW called pt's sister and discussed option for heart failure HH RN. Sister is unsure about services. CSW explained that RN would come to check meds and vitals and provide heart failure education. Sister would like to speak with MD about need for heart failure RN. CSW informed sister to ask for case manager if they want to try getting a [facility] RN. CSW did explain possible limitations of getting RN due to staffing at [facility] agencies.

## 2020-05-10 NOTE — Progress Notes (Addendum)
PROGRESS NOTE  [name] GOT:[id number] DOB: May 12, 1970 DOA: 04/27/2020 PCP: [name], MD   LOS: 13 days  Brief narrative: [name] is a 50 y.o. male with past medical history of lupus, chronic pancytopenia, B12 deficiency, hypothyroidism presented to the hospital with shortness of breath, abdominal distention and swelling of the legs for 3 weeks.  Patient does have history of lupus, was seen by rheumatologist 3 to 4 months ago but stopped taking Imuran by himself. In the ED, patient was to have severe abdominal distention as well as leg swelling.  CT scan of the chest and abdomen showed evidence of ascites as well as anasarca.  Work-up also showed hypoalbuminemia and severe AKI.  -Nephrology consulted, transferred to [facility] and started on hemodialysis -Underwent kidney biopsy 1/4 -1/5: Acute blood loss anemia, CT- large retroperitoneal hematoma -Has required 3 paracentesis this admission, last on 1/11 1/13 KUB with ileus, patient with no BM since 1/6 1/14 Had 2 small BMs overnight, repeat KUB showing persistent ileus.   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, suppressed C3 and C4, weakly positive ASO, negative anti-GBM -Nephrology following -Started on high-dose IV Solu-Medrol, now prednisone 60 mg and high-dose Lasix, Started MMF 1/9 -started hemodialysis 1/4 -Underwent kidney biopsy in IR 1/4, complicated by large retroperitoneal hematoma -Also underwent paracentesis on 1/2 with 2 L drained followed by 1/4, 5 L drained, last paracentesis yesterday 1/11-4 L drained -Remains clinically volume overloaded, anticipate he will need frequent paracentesis in the near future, nephrology following, plan to pull off more fluid today as tolerated -Path tissues unfortunately misdirected to [facility] instead of [facility], now working on getting this to [facility] pathology 1/13  Path at [facility], still pending. Nephrology following. Ascites improving per his report but will continue monitoring for possible need of repeat therapeutic paracentesis.  1/14 Will follow up with Nephrology for results of path report.  Will start outpatient HD TThSat upon discharge. Will have HD tomorrow.  Having paracentesis again today, this is his 55rd. IR was able to remove 2.8L. Patient receiving albumin infusion after paracentesis for BP support.   Acute blood loss anemia Large retroperitoneal hematoma -Following kidney biopsy, CT scan revealed large 11 x 6 cm hematoma -1 unit of PRBC transfused 1/5, -Hemoglobin stable now 1/14 Hgb low to 7.5 but stable.   Hyponatremia.  -Secondary to hypervolemia, improving with HD  History of lupus, pleurisy, pericardial effusion -  2D echocardiogram from 04/28/2020 showed LV ejection fraction of 55 to 60% with moderate LVH but no pericardial effusion but trace pleural effusion.  Followed by Dr. Kathlene November in the past.  Currently on high-dose prednisone  Ileus v partial SBO Constipation -Multifactorial, continue laxatives, refused enema again yesterday  -Add lactulose, reattempt enema today 1/13 KUB ordered today, showed ileus. Patient with nausea and vomiting. No BM since 1/6. Now agrees to enema which has been ordered. May require several enemas. If not improving may need NG tube placement, Surgery consultation.  1/14 Patient with 2 enemas overnight but only 2 small BMs. Reports feeling better but still has nausea. Repeat KUB today showing ileus. Ordered edema again for today.   History of Lupus. Noncompliant with medication regimen in the past.   -Will need continued follow-up as outpatient with rheumatologist.  Chronic pancytopenia/ leukopenia. -Likely secondary to lupus, was seen by oncology in the past and had bone marrow biopsy.    Hypothyroidism TSH of 7.8.  Continue Synthroid.  Repeat thyroid function as outpatient.   DVT prophylaxis: SCDs due  to large retroperitoneal hematoma Code Status: Full code Family Communication: Discussed with sister in detail over the phone on 1/13 Status is: Inpatient  Remains inpatient appropriate because:IV treatments appropriate due to intensity of illness or inability to take PO and Inpatient level of care appropriate due to severity of illness, severe AKI with fluid overload  Dispo: The patient is from: Home              Anticipated d/c is to: Home              Anticipated d/c date is: in 1-2 days               Patient currently is not medically stable to d/c.   Consultants: Nephrology IR  Procedures:  Paracentesis 1/2, 1/4,  1/11, and 1/14  Renal biopsy 1/4   Anti-infectives (From admission, onward)   Start     Dose/Rate Route Frequency Ordered Stop   05/03/20 1230  ceFAZolin (ANCEF) IVPB 2g/100 mL premix  Status:  Discontinued        2 g 200 mL/hr over 30 Minutes Intravenous  Once 05/03/20 1132 05/03/20 1132   05/03/20 1230  ceFAZolin (ANCEF) IVPB 2g/100 mL premix        2 g 200 mL/hr over 30 Minutes Intravenous On call 05/03/20 1132 05/03/20 1259   05/03/20 1015  sulfamethoxazole-trimethoprim (BACTRIM DS) 800-160 MG per tablet 1 tablet        1 tablet Oral Once per day on Mon Wed Fri 05/03/20 4492       Subjective: Abdomen remains distended. Has not had an adequate BM since 1/6. Had 2 enemas overnight with only 2 small BMs. Has some nausea, did not eat much breakfast or lunch.    Objective: Vitals:   05/10/20 1227 05/10/20 1602  BP: 102/69 109/66  Pulse: 80 90  Resp: (!) 22 13  Temp: 98.2 F (36.8 C)   SpO2: 95% 95%    Intake/Output Summary (Last 24 hours) at 05/10/2020 1914 Last data filed at 05/10/2020 1525 Gross per 24 hour  Intake 123 ml  Output 4818 ml  Net -4695 ml   Filed Weights   05/07/20 0419 05/08/20 0729 05/10/20 0603  Weight: 83.2 kg 78.2 kg 77.2 kg   Body mass index is 23.74 kg/m.   Physical Exam:  General: Pleasant chronically ill male,  sitting up in bed, in NAD HEENT: Positive JVD, right IJ HD catheter  CVS: S1 and S2 present, RRR Lungs: No cough, normal rate Abdomen: Soft, distended, no rebound tenderness Extremities: trace edema bl LE Skin: No rashes on exposed skin   Data Review: I have personally reviewed the following laboratory data and studies reports  CBC: Recent Labs  Lab 05/06/20 0649 05/07/20 0546 05/08/20 0044 05/09/20 0026 05/10/20 1035  WBC 8.0 10.0 12.1* 10.4 9.1  HGB 8.8* 8.9* 9.3* 8.3* 7.5*  HCT 26.1* 27.3* 28.1* 24.6* 22.9*  MCV 88.5 89.8 89.2 89.5 91.6  PLT 220 193 196 183 010   Basic Metabolic Panel: Recent Labs  Lab 05/05/20 0737 05/06/20 0649 05/07/20 0546 05/08/20 0044 05/09/20 0026 05/10/20 1035  NA 133* 131* 133* 130* 134* 131*  K 3.8 3.6 3.7 4.0 3.6 3.8  CL 98 97* 97* 95* 98 93*  CO2 _0 GLUCOSE 123* 124* 102* 127* 151* 88  BUN 53* 73* 53* 68* 41* 66*  CREATININE 4.64*  5.93* 5.03* 6.07* 4.52* 6.12*  CALCIUM 7.6* 7.5* 7.7* 7.6* 7.6* 7.7*  PHOS 5.9*  --   --   --   --  9.0*   Liver Function Tests: Recent Labs  Lab 05/05/20 0737 05/10/20 1035  ALBUMIN 2.0* 1.9*   No results for input(s): LIPASE, AMYLASE in the last 168 hours. No results for input(s): AMMONIA in the last 168 hours. Cardiac Enzymes: No results for input(s): CKTOTAL, CKMB, CKMBINDEX, TROPONINI in the last 168 hours. BNP (last 3 results) Recent Labs    04/27/20 2016  BNP 36.5    ProBNP (last 3 results) Recent Labs    11/06/19 1613  PROBNP 337.0*    CBG: No results for input(s): GLUCAP in the last 168 hours. No results found for this or any previous visit (from the past 240 hour(s)).   Studies: DG Abd 1 View  Result Date: 05/10/2020 CLINICAL DATA:  Ileus EXAM: ABDOMEN - 1 VIEW COMPARISON:  May 09, 2020 FINDINGS: There remains multiple loops of dilated small bowel without air-fluid levels. No free air. Probable phlebolith in right pelvis. IMPRESSION: Loops of dilated  small bowel persist. Suspect ileus, although distal degree of bowel obstruction cannot be excluded. No free air evident on supine examination. Electronically Signed   By: Lowella Grip III M.D.   On: 05/10/2020 08:53   DG Abd Portable 1V  Result Date: 05/09/2020 CLINICAL DATA:  Follow-up of ileus EXAM: PORTABLE ABDOMEN - 1 VIEW COMPARISON:  05/01/2020 CT FINDINGS: Single supine view of the abdomen and pelvis. Small bowel loops measure up to 4.5 cm, felt to be increased compared to the recent CT. No gross free intraperitoneal air. Moderate amount of ascending colonic stool. IMPRESSION: Mid small bowel dilatation, felt to be increased compared to 05/01/2020. Ileus versus low-grade partial small bowel obstruction. Electronically Signed   By: Abigail Miyamoto M.D.   On: 05/09/2020 15:49   IR Paracentesis  Result Date: 05/10/2020 INDICATION: History of heart failure with volume overload. Request for therapeutic paracentesis up to 5 L max. EXAM: ULTRASOUND GUIDED RIGHT LOWER QUADRANT PARACENTESIS MEDICATIONS: None. COMPLICATIONS: None immediate. PROCEDURE: Informed written consent was obtained from the patient after a discussion of the risks, benefits and alternatives to treatment. A timeout was performed prior to the initiation of the procedure. Initial ultrasound scanning demonstrates a large amount of ascites within the right lower abdominal quadrant. The right lower abdomen was prepped and draped in the usual sterile fashion. 1% lidocaine was used for local anesthesia. Following this, a 19 gauge, 7-cm, Yueh catheter was introduced. An ultrasound image was saved for documentation purposes. The paracentesis was performed. The catheter was removed and a dressing was applied. The patient tolerated the procedure well without immediate post procedural complication. FINDINGS: A total of approximately 2.8 L of clear yellow fluid was removed. IMPRESSION: Successful ultrasound-guided paracentesis yielding 2.8 liters of  peritoneal fluid. Read by: Ascencion Dike PA-C Electronically Signed   By: Jacqulynn Cadet M.D.   On: 05/10/2020 15:48    Time spent: 35 minutes  Blain Pais, MD  [facility] 05/10/2020

## 2020-05-10 NOTE — Plan of Care (Signed)

## 2020-05-10 NOTE — Progress Notes (Signed)
Admit: 04/27/2020 LOS: 67  76M hx/o SLE off IS presenting with AKI, hypervolemia/ascites, hematuria, proteinuria  Subjective:  Seen on hd. Rec'd enemas therefore did not get good sleep but abdomen feels so much better as per patient. Very eager to go home. No complaints  01/13 0701 - 01/14 0700 In: 363 [P.O.:360; I.V.:3] Out: -   Filed Weights   05/07/20 0419 05/08/20 0729 05/10/20 0603  Weight: 83.2 kg 78.2 kg 77.2 kg    Scheduled Meds: . (feeding supplement) PROSource Plus  30 mL Oral TID BM  . sodium chloride   Intravenous Once  . Chlorhexidine Gluconate Cloth  6 each Topical Daily  . Darbepoetin Alfa      . darbepoetin (ARANESP) injection - DIALYSIS  60 mcg Intravenous Q Thu-HD  . feeding supplement (NEPRO CARB STEADY)  237 mL Oral Q24H  . folic acid  1 mg Oral Daily  . kidney failure book   Does not apply Once  . lactulose  20 g Oral BID  . levothyroxine  50 mcg Oral Q0600  . multivitamin  1 tablet Oral QHS  . mycophenolate  750 mg Oral BID  . polyethylene glycol  17 g Oral BID  . predniSONE  60 mg Oral Q breakfast  . senna-docusate  1 tablet Oral BID  . sodium chloride flush  3 mL Intravenous Q12H  . sorbitol, milk of mag, mineral oil, glycerin (SMOG) enema  960 mL Rectal Once  . sulfamethoxazole-trimethoprim  1 tablet Oral Once per day on Mon Wed Fri  . vitamin B-12  1,000 mcg Oral Daily   Continuous Infusions: . sodium chloride    . albumin human    . albumin human     PRN Meds:.sodium chloride, acetaminophen, bisacodyl, lidocaine, ondansetron (ZOFRAN) IV, oxyCODONE-acetaminophen, sodium chloride flush  Current Labs: reviewed  Results for DAYLON, LAFAVOR (MRN [id number]) as of 04/29/2020 13:40 Hepatitis B Surface Ag Latest Ref Range: NON REACTIVE  NON REACTIVE    Ref. Range 04/28/2020 19:21  HCV Ab Latest Ref Range: NON REACTIVE  NON REACTIVE   Results for JOVONTAE, BANKO (MRN [id number]) as of 04/29/2020 13:40 IV Screen 4th Generation wRfx  Latest Ref Range: Non  Reactive  Non Reactive   Results for DOMINGO, FUSON (MRN [id number]) as of 04/30/2020 14:03  Ref. Range 04/28/2020 19:21  ds DNA Ab Latest Ref Range: 0 - 9 IU/mL >300 (H)    C4 5, C3 47  Results for DYSON, SEVEY (MRN [id number]) as of 05/03/2020 09:26  Ref. Range 05/02/2020 13:05  Saturation Ratios Latest Ref Range: 17.9 - 39.5 % 93 (H)  Ferritin Latest Ref Range: 24 - 336 ng/mL 564 (H)    Physical Exam:  Blood pressure 119/87, pulse 82, temperature 98 F (36.7 C), temperature source Oral, resp. rate 11, height 5\' 11"  (1.803 m), weight 77.2 kg, SpO2 94 %. Gen: NAD in bed  CV: RRR no rub Resp: normal wob MSK: 2+ pitting edema b/l LE's Abd: distended/nontender, softer today HEENT: NCAT, EOMI Access: R IJ Tunneled HD Cath  A 1. AKI/RPGN with hematuria/proteinuria and hx/o #2;  1. hypocomplementemia with inc dsDNA and +ANA; neg AntiGBM; ANCA neg 2. Started HD 04/30/20 3. Renal Bx 04/30/20 c/b perinephric hematoma and ABLA, see #9 4. Temp HD cath 04/30/20, then tunneled on 1/7 5. S/p solumedrol 500mg  IV x3d 04/29/20-05/01/20 --> Pred 60mg /d for now 6. Very high supsicion for Class III or more likley IV, +/- V LN; await prelim Bx but was  not sent to [facility] for nephropathology, local path group working on this; not sure how much info we can expect from [facility] since they will have to work with processed tissue.  7. 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  Cont pred with taper in time.  Cont TIW TMP/SMX 8. Need to complete outpt AKI CLIP anticipating prolonged HD dependence 9. PT/OT 2. Hx/o SLE, followed by Aryal, prev on imuran off sev mo, hx/o pleuritis/pericarditis 3. Ascites/Hypervolemia likely related #1 and #4; TTE w/ nL LVEF and R sided function 1. LVP 2L 1/2 and then 5L on 1/4, 4L 1/11  2. Hopefully can manage vol with HD 4. Hypoalbuminemia; have used bolus albumin in HD to augment UF 5. Pancytopenia; mild/improved 6. Hyperkalemia,  mild, resolved with HD 7. Metabolic Acidosis, NAG related to #1, improved with HD 8. Hyperphosphatemia, trend with HD 9. Anemia, AoC, 1.7 drop in Hb in 24h post bx, imaging with hematoma, stabilized and hemodynamics stable; IR following.  On ESA; High TSAT no role for IV Fe 10. Hypervolemic hyponatremia  P . Okay for discharge from a nephrology perspective. Has a chair at [place] chair time 12pm. If still here tomorrow, then will plan for dialysis to maintain TTS . c/w pred 60mg /d, taper after 65m, taper to 40mg  on 06/02/20 . titrated MMF to 750mg  bid. Next step is to up-titrate to 1g BID in 1 week . Called [facility] Pathology: did not have specimen. Called local path here, should be in process? Will need to follow this closely . He declined COVID19 Vaccine, did just rec monoclonal Ab infusion as a close contact.  . Daily weights, Daily Renal Panel, Strict I/Os, Avoid nephrotoxins (NSAIDs, judicious IV Contrast)  Gean Quint, MD [facility] 05/10/2020, 12:18 PM  Recent Labs  Lab 05/05/20 0737 05/06/20 0649 05/08/20 0044 05/09/20 0026 05/10/20 1035  NA 133*   < > 130* 134* 131*  K 3.8   < > 4.0 3.6 3.8  CL 98   < > 95* 98 93*  CO2 26   < > 23 25 25   GLUCOSE 123*   < > 127* 151* 88  BUN 53*   < > 68* 41* 66*  CREATININE 4.64*   < > 6.07* 4.52* 6.12*  CALCIUM 7.6*   < > 7.6* 7.6* 7.7*  PHOS 5.9*  --   --   --  9.0*   < > = values in this interval not displayed.   Recent Labs  Lab 05/08/20 0044 05/09/20 0026 05/10/20 1035  WBC 12.1* 10.4 9.1  HGB 9.3* 8.3* 7.5*  HCT 28.1* 24.6* 22.9*  MCV 89.2 89.5 91.6  PLT 196 183 181

## 2020-05-10 NOTE — Progress Notes (Signed)
PT Cancellation Note  Patient Details Name: [name] MRN: [id number] DOB: [birth year]   Cancelled Treatment:    Reason Eval/Treat Not Completed: (P) Patient at procedure or test/unavailable Pt is off floor for HD. PT will follow back this afternoon as able.  [name] B. [name] PT, DPT Acute Rehabilitation Services Pager [phone] Office [phone]    [place] 05/10/2020, 9:55 AM

## 2020-05-10 NOTE — Progress Notes (Signed)
Patient given SMOG enema, per patient he had 2 very large bowel movements since enema. Patient states that he feels better and stated that he was able to drink a ginger ale and did not vomit.

## 2020-05-10 NOTE — Progress Notes (Signed)
Renal Navigator received call from patient's sister who states she thinks patient may be discharged tomorrow and wants to know HD plans in this case.  Navigator spoke with Nephrologist/Dr. Candiss Norse to confirm and then called sister back. We will plan for inpt HD on Saturday and will ask for patient to run as early as unit can accommodate in order for discharge, since it is too late for patient to start in the clinic tomorrow at this point. Patient is cleared for discharge after Saturday HD, if this is Attending's plan, and will start in the outpatient clinic on Tuesday, 05/14/20. Patient's sister very appreciative of communication. Renal Navigator updated Renal PA to request orders be sent to clinic at discharge if over the weekend.   Alphonzo Cruise, [place] Renal Navigator [phone]

## 2020-05-10 NOTE — Progress Notes (Signed)
Soap sud enema * 2 given throughout the night. Patient tolerated it well. Had  bowel movements after each session. Patient states he feels much better this morning.

## 2020-05-11 ENCOUNTER — Inpatient Hospital Stay (HOSPITAL_COMMUNITY): Payer: BC Managed Care – PPO

## 2020-05-11 DIAGNOSIS — I5031 Acute diastolic (congestive) heart failure: Secondary | ICD-10-CM | POA: Diagnosis not present

## 2020-05-11 LAB — CBC
HCT: 19.9 % — ABNORMAL LOW (ref 39.0–52.0)
HCT: 24.7 % — ABNORMAL LOW (ref 39.0–52.0)
Hemoglobin: 6.3 g/dL — CL (ref 13.0–17.0)
Hemoglobin: 8.1 g/dL — ABNORMAL LOW (ref 13.0–17.0)
MCH: 29.4 pg (ref 26.0–34.0)
MCH: 29.9 pg (ref 26.0–34.0)
MCHC: 31.7 g/dL (ref 30.0–36.0)
MCHC: 32.8 g/dL (ref 30.0–36.0)
MCV: 93 fL (ref 80.0–100.0)
MCV: 91.1 fL (ref 80.0–100.0)
Platelets: 126 10*3/uL — ABNORMAL LOW (ref 150–400)
Platelets: 129 10*3/uL — ABNORMAL LOW (ref 150–400)
RBC: 2.14 MIL/uL — ABNORMAL LOW (ref 4.22–5.81)
RBC: 2.71 MIL/uL — ABNORMAL LOW (ref 4.22–5.81)
RDW: 13.2 % (ref 11.5–15.5)
RDW: 13.2 % (ref 11.5–15.5)
WBC: 6.1 10*3/uL (ref 4.0–10.5)
WBC: 7 10*3/uL (ref 4.0–10.5)
nRBC: 0 % (ref 0.0–0.2)
nRBC: 0 % (ref 0.0–0.2)

## 2020-05-11 LAB — RENAL FUNCTION PANEL
Albumin: 2.3 g/dL — ABNORMAL LOW (ref 3.5–5.0)
Anion gap: 10 (ref 5–15)
BUN: 50 mg/dL — ABNORMAL HIGH (ref 6–20)
CO2: 26 mmol/L (ref 22–32)
Calcium: 7.3 mg/dL — ABNORMAL LOW (ref 8.9–10.3)
Chloride: 97 mmol/L — ABNORMAL LOW (ref 98–111)
Creatinine, Ser: 5.47 mg/dL — ABNORMAL HIGH (ref 0.61–1.24)
GFR, Estimated: 12 mL/min — ABNORMAL LOW (ref >60)
Glucose, Bld: 80 mg/dL (ref 70–99)
Phosphorus: 6.4 mg/dL — ABNORMAL HIGH (ref 2.5–4.6)
Potassium: 3.5 mmol/L (ref 3.5–5.1)
Sodium: 133 mmol/L — ABNORMAL LOW (ref 135–145)

## 2020-05-11 LAB — PREPARE RBC (CROSSMATCH)

## 2020-05-11 MED ORDER — FAMOTIDINE 20 MG PO TABS: 20.0000 mg | ORAL_TABLET | Freq: Every day | ORAL | 0 refills | Status: DC

## 2020-05-11 MED ORDER — ALBUMIN HUMAN 25 % IV SOLN: 25.0000 g | Freq: Once | INTRAVENOUS | Status: DC

## 2020-05-11 MED ORDER — LACTULOSE 10 GM/15ML PO SOLN: 20.0000 g | Freq: Two times a day (BID) | ORAL | 0 refills | Status: DC | PRN

## 2020-05-11 MED ORDER — HEPARIN SODIUM (PORCINE) 1000 UNIT/ML IJ SOLN
INTRAMUSCULAR | Status: AC
  Filled 2020-05-11: qty 4

## 2020-05-11 MED ORDER — SULFAMETHOXAZOLE-TRIMETHOPRIM 800-160 MG PO TABS: 1.0000 | ORAL_TABLET | ORAL | 0 refills | Status: AC

## 2020-05-11 MED ORDER — PROSOURCE PLUS PO LIQD: 30.0000 mL | Freq: Three times a day (TID) | ORAL | 0 refills | Status: DC

## 2020-05-11 MED ORDER — ACETAMINOPHEN 325 MG PO TABS: 650.0000 mg | ORAL_TABLET | ORAL | Status: DC | PRN

## 2020-05-11 MED ORDER — MYCOPHENOLATE MOFETIL 250 MG PO CAPS: ORAL_CAPSULE | ORAL | 0 refills | Status: AC

## 2020-05-11 MED ORDER — RENA-VITE PO TABS: 1.0000 | ORAL_TABLET | Freq: Every day | ORAL | 0 refills | Status: AC

## 2020-05-11 MED ORDER — NEPRO/CARBSTEADY PO LIQD: 237.0000 mL | ORAL | 0 refills | Status: DC

## 2020-05-11 MED ORDER — PREDNISONE 20 MG PO TABS: ORAL_TABLET | ORAL | 0 refills | Status: AC

## 2020-05-11 MED ORDER — SODIUM CHLORIDE 0.9% IV SOLUTION: Freq: Once | INTRAVENOUS | Status: DC

## 2020-05-11 NOTE — Progress Notes (Signed)
Admit: 04/27/2020 LOS: 21  67M hx/o SLE off IS presenting with AKI, hypervolemia/ascites, hematuria, proteinuria  Subjective:  Seen on hd. S/p para yesterday. Having bowel movements now. hgb 6.3, 1u prbc to be given. Very eager to go home, feels a lot better after having BM's and para. ufg 2500  01/14 0701 - 01/15 0700 In: 243 [P.O.:240; I.V.:3] Out: 4818   Filed Weights   05/10/20 0603 05/11/20 0231 05/11/20 0730  Weight: 77.2 kg 72.7 kg 72.2 kg    Scheduled Meds: . (feeding supplement) PROSource Plus  30 mL Oral TID BM  . sodium chloride   Intravenous Once  . sodium chloride   Intravenous Once  . Chlorhexidine Gluconate Cloth  6 each Topical Daily  . darbepoetin (ARANESP) injection - DIALYSIS  60 mcg Intravenous Q Thu-HD  . feeding supplement (NEPRO CARB STEADY)  237 mL Oral Q24H  . folic acid  1 mg Oral Daily  . heparin sodium (porcine)      . kidney failure book   Does not apply Once  . lactulose  20 g Oral BID  . levothyroxine  50 mcg Oral Q0600  . multivitamin  1 tablet Oral QHS  . mycophenolate  750 mg Oral BID  . polyethylene glycol  17 g Oral BID  . predniSONE  60 mg Oral Q breakfast  . senna-docusate  1 tablet Oral BID  . sodium chloride flush  3 mL Intravenous Q12H  . sulfamethoxazole-trimethoprim  1 tablet Oral Once per day on Mon Wed Fri  . vitamin B-12  1,000 mcg Oral Daily   Continuous Infusions: . sodium chloride    . albumin human    . albumin human     PRN Meds:.sodium chloride, acetaminophen, bisacodyl, lidocaine, ondansetron (ZOFRAN) IV, oxyCODONE-acetaminophen, sodium chloride flush  Current Labs: reviewed  Results for [name], [name] (MRN [id number]) as of 04/29/2020 13:40 Hepatitis B Surface Ag Latest Ref Range: NON REACTIVE  NON REACTIVE    Ref. Range 04/28/2020 19:21  HCV Ab Latest Ref Range: NON REACTIVE  NON REACTIVE   Results for [name], [name] (MRN [id number]) as of 04/29/2020 13:40 IV Screen 4th Generation wRfx  Latest Ref Range: Non  Reactive  Non Reactive   Results for [name], [name] (MRN [id number]) as of 04/30/2020 14:03  Ref. Range 04/28/2020 19:21  ds DNA Ab Latest Ref Range: 0 - 9 IU/mL >300 (H)    C4 5, C3 47  Results for [name], [name] (MRN [id number]) as of 05/03/2020 09:26  Ref. Range 05/02/2020 13:05  Saturation Ratios Latest Ref Range: 17.9 - 39.5 % 93 (H)  Ferritin Latest Ref Range: 24 - 336 ng/mL 564 (H)    Physical Exam:  Blood pressure 117/75, pulse 98, temperature 98.2 F (36.8 C), resp. rate 15, height 5\' 11"  (1.803 m), weight 72.2 kg, SpO2 98 %. Gen: NAD in bed  CV: RRR no rub Resp: normal wob MSK: 2+ pitting edema b/l LE's Abd: distended/nontender, softer today HEENT: NCAT, EOMI Access: R IJ Tunneled HD Cath  A 1. AKI/RPGN with hematuria/proteinuria and hx/o #2;  1. hypocomplementemia with inc dsDNA and +ANA; neg AntiGBM; ANCA neg 2. Started HD 04/30/20 3. Renal Bx 04/30/20 c/b perinephric hematoma and ABLA, see #9 4. Temp HD cath 04/30/20, then tunneled on 1/7 5. S/p solumedrol 500mg  IV x3d 04/29/20-05/01/20 --> Pred 60mg /d for now 6. Very high supsicion for Class III or more likley IV, +/- V LN; await prelim Bx but was not sent to [facility]  for nephropathology, local path group working on this; not sure how much info we can expect from [facility] since they will have to work with processed tissue.  7. 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  Cont pred with taper in time.  Cont TIW TMP/SMX 8. Need to complete outpt AKI CLIP anticipating prolonged HD dependence 9. PT/OT 2. Hx/o SLE, followed by Aryal, prev on imuran off sev mo, hx/o pleuritis/pericarditis 3. Ascites/Hypervolemia likely related #1 and #4; TTE w/ nL LVEF and R sided function 1. LVP 2L 1/2 and then 5L on 1/4, 4L 1/11  2. Hopefully can manage vol with HD 4. Hypoalbuminemia; have used bolus albumin in HD to augment UF 5. Pancytopenia; mild/improved 6. Hyperkalemia, mild, resolved with  HD 7. Metabolic Acidosis, NAG related to #1, improved with HD 8. Hyperphosphatemia, trend with HD 9. Anemia, AoC, 1.7 drop in Hb in 24h post bx, imaging with hematoma, stabilized and hemodynamics stable; IR following.  On ESA; High TSAT no role for IV Fe 10. Hypervolemic hyponatremia  P . Okay for discharge from a nephrology perspective, provided hgb is stable. Has a chair at [place] chair time 12pm. Starting HD this coming Tues . 1u prbc w/ HD today . c/w pred 60mg /d, taper after 56m, taper to 40mg  on 06/02/20 . titrated MMF to 750mg  bid. Next step is to up-titrate to 1g BID in 1 week . Called [facility] Pathology this week: did not have specimen. Called local path here, should be in process? Will need to follow this closely . He declined COVID19 Vaccine, did just rec monoclonal Ab infusion as a close contact.  . Daily weights, Daily Renal Panel, Strict I/Os, Avoid nephrotoxins (NSAIDs, judicious IV Contrast)  Gean Quint, MD [facility] 05/11/2020, 11:17 AM  Recent Labs  Lab 05/05/20 0737 05/06/20 0649 05/09/20 0026 05/10/20 1035 05/11/20 0756  NA 133*   < > 134* 131* 133*  K 3.8   < > 3.6 3.8 3.5  CL 98   < > 98 93* 97*  CO2 26   < > 25 25 26   GLUCOSE 123*   < > 151* 88 80  BUN 53*   < > 41* 66* 50*  CREATININE 4.64*   < > 4.52* 6.12* 5.47*  CALCIUM 7.6*   < > 7.6* 7.7* 7.3*  PHOS 5.9*  --   --  9.0* 6.4*   < > = values in this interval not displayed.   Recent Labs  Lab 05/09/20 0026 05/10/20 1035 05/11/20 0756  WBC 10.4 9.1 6.1  HGB 8.3* 7.5* 6.3*  HCT 24.6* 22.9* 19.9*  MCV 89.5 91.6 93.0  PLT 183 181 126*

## 2020-05-11 NOTE — Plan of Care (Signed)
  Problem: Activity: Goal: Risk for activity intolerance will decrease Outcome: Adequate for Discharge   Problem: Nutrition: Goal: Adequate nutrition will be maintained Outcome: Adequate for Discharge   Problem: Elimination: Goal: Will not experience complications related to bowel motility Outcome: Adequate for Discharge Goal: Will not experience complications related to urinary retention Outcome: Adequate for Discharge   Problem: Pain Managment: Goal: General experience of comfort will improve Outcome: Adequate for Discharge   Problem: Safety: Goal: Ability to remain free from injury will improve Outcome: Adequate for Discharge   Problem: Skin Integrity: Goal: Risk for impaired skin integrity will decrease Outcome: Adequate for Discharge

## 2020-05-11 NOTE — Discharge Summary (Signed)
Physician Discharge Summary  [name]:[id number] DOB: 05/16/1970 DOA: 04/27/2020  PCP: [name], MD  Admit date: 04/27/2020 Discharge date: 05/11/2020  Admitted From: Home Disposition:  Home (patient was offered but declined [facility] services)  Recommendations for Outpatient Follow-up:  1. Follow up with PCP and Nephrologist in 1-2 weeks   Home Health:No (patient declined [facility] services) Equipment/Devices:No  Discharge Condition:Stable CODE STATUS:Full Diet recommendation: Renal diet.   Brief/Interim Summary: Summary: He reports having a BM on the morning of his discharge and reports that his abdominal discomfort had improved. KUB showed improvement of his ileus. He blood transfusion of 1 unit of pRBC with HD and his post-transfusion Hgb was stable at 8.1 (Hgb prior to transfusion was 6.30). He will be discharged home with close follow up with Nephrology as the path report for his biopsy is still pending. He will continue cellcept and prednisone but understands that he should not stop these medications abruptly and must follow up with Nephrology for further recommendations on treatment plan. These recommendations were also discussed with his sister at the bedside, they both verbalized understanding and agreed with this plan. He will take OTC pepcid for GI prophylaxis while taking prednisone.   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, suppressed C3 and C4, weakly positive ASO, negative anti-GBM -Nephrology following -Started on high-dose IV Solu-Medrol, now prednisone 60 mg and high-dose Lasix, Started MMF 1/9 -started hemodialysis 1/4 -Underwent kidney biopsy in IR 1/4, complicated by large retroperitoneal hematoma -Also underwent paracentesis on 1/2 with 2 L drained followed by 1/4, 5 L  drained, last paracentesis yesterday 1/11-4 L drained -Remains clinically volume overloaded, anticipate he will need frequent paracentesis in the near future, nephrology following, plan to pull off more fluid today as tolerated -Path tissues unfortunately misdirected to [facility] instead of [facility], now working on getting this to [facility] pathology 1/13 Path at [facility], still pending. Nephrology following. Ascites improving per his report but will continue monitoring for possible need of repeat therapeutic paracentesis.  1/14 Will follow up with Nephrology for results of path report.  Will start outpatient HD TThSat upon discharge. Will have HD tomorrow.  Having paracentesis again today, this is his 66rd. IR was able to remove 2.8L. Patient receiving albumin infusion after paracentesis for BP support.   Acute blood loss anemia Large retroperitoneal hematoma -Following kidney biopsy, CT scan revealed large 11 x 6 cm hematoma -1 unit of PRBC transfused 1/5, -Hemoglobin stable now 1/14 Hgb low to 7.5 but stable.   Hyponatremia.  -Secondary to hypervolemia, improving with HD  History of lupus, pleurisy, pericardial effusion -  2D echocardiogram from 04/28/2020 showed LV ejection fraction of 55 to 60% with moderate LVH but no pericardial effusion but trace pleural effusion.  Followed by Dr. Kathlene November in the past.  Currently on high-dose prednisone  Ileus v partial SBO Constipation -Multifactorial, continue laxatives, refused enema again yesterday  -Add lactulose, reattempt enema today 1/13 KUB ordered today, showed ileus. Patient with nausea and vomiting. No BM since 1/6. Now agrees to enema which has been ordered. May require several enemas. If not improving may need NG tube placement, Surgery consultation.  1/14 Patient with 2 enemas overnight but only 2 small BMs. Reports feeling better but still has nausea. Repeat KUB today showing ileus. Ordered edema again for today.   History of Lupus.  Noncompliant with  medication regimen in the past.   -Will need continued follow-up as outpatient with rheumatologist.  Chronic pancytopenia/ leukopenia. -Likely secondary to lupus, was seen by oncology in the past and had bone marrow biopsy.    Hypothyroidism TSH of 7.8. Continue Synthroid.  Repeat thyroid function as outpatient.     Discharge Diagnoses:  Principal Problem:   Acute heart failure with preserved ejection fraction (HFpEF) (HCC) Active Problems:   Lupus (systemic lupus erythematosus) (HCC)   Dyspnea   Systemic lupus erythematosus with lung involvement (HCC)   Elevated TSH   Stage 3a chronic kidney disease (HCC)   Malnutrition of moderate degree    Discharge Instructions  Discharge Instructions    Diet - low sodium heart healthy   Complete by: As directed    Renal diet with low potassium, 1260ml of fluid per day   Increase activity slowly   Complete by: As directed    No wound care   Complete by: As directed      Allergies as of 05/11/2020   No Known Allergies     Medication List    STOP taking these medications   furosemide 40 MG tablet Commonly known as: LASIX   ibuprofen 200 MG tablet Commonly known as: ADVIL   lisinopril 20 MG tablet Commonly known as: ZESTRIL   potassium chloride SA 20 MEQ tablet Commonly known as: KLOR-CON     TAKE these medications   (feeding supplement) PROSource Plus liquid Take 30 mLs by mouth 3 (three) times daily between meals.   feeding supplement (NEPRO CARB STEADY) Liqd Take 237 mLs by mouth daily.   acetaminophen 325 MG tablet Commonly known as: TYLENOL Take 2 tablets (650 mg total) by mouth every 4 (four) hours as needed for headache or mild pain.   B-12 1000 MCG Caps Take one daily   vitamin B-12 1000 MCG tablet Commonly known as: CYANOCOBALAMIN Take 1,000 mcg by mouth daily.   famotidine 20 MG tablet Commonly known as: PEPCID Take 1 tablet (20 mg total) by mouth daily.   folic acid 1 MG tablet Commonly known  as: FOLVITE Take 1 tablet by mouth once daily   lactulose 10 GM/15ML solution Commonly known as: CHRONULAC Take 30 mLs (20 g total) by mouth 2 (two) times daily as needed for moderate constipation. y.   levothyroxine 50 MCG tablet Commonly known as: SYNTHROID TAKE 1 TABLET BY MOUTH ONCE DAILY BEFORE BREAKFAST   multivitamin Tabs tablet Take 1 tablet by mouth at bedtime.   mycophenolate 250 MG capsule Commonly known as: CELLCEPT Take 3 capsules (750 mg total) by mouth 2 (two) times daily for 7 days, THEN 4 capsules (1,000 mg total) 2 (two) times daily for 21 days. Follow up with your Nephrologist for refills.. Start taking on: May 11, 2020   predniSONE 20 MG tablet Commonly known as: DELTASONE Take 3 tablets (60 mg total) by mouth daily with breakfast for 22 days, THEN 2 tablets (40 mg total) daily with breakfast. Do not stop taking this medication abruptly. Follow up with your Nephrologist to discuss when to discontinue this medication.. Start taking on: May 12, 2020   sulfamethoxazole-trimethoprim 800-160 MG tablet Commonly known as: BACTRIM DS Take 1 tablet by mouth 3 (three) times a week. Start taking on: May 13, 2020   Vitamin D (Ergocalciferol) 1.25 MG (50000 UNIT) Caps capsule Commonly known as: DRISDOL Take 1 capsule (50,000 Units total) by mouth every 7 (seven) days.  Follow-up Information    [place], [place]. Schedule an appointment as soon as possible for a visit in 2 week(s).   Why: Call to make a follow up appointment for 1-2 weeks.  Contact information: [place] [address] [phone]              No Known Allergies  Consultations: Nephrology IR   Procedures/Studies: CT ABDOMEN PELVIS WO CONTRAST  Result Date: 05/01/2020 CLINICAL DATA:  Anemia, falling hemoglobin, status post renal biopsy and paracentesis EXAM: CT ABDOMEN AND PELVIS WITHOUT CONTRAST TECHNIQUE: Multidetector CT imaging of the abdomen and pelvis was  performed following the standard protocol without IV contrast. COMPARISON:  04/27/2020 FINDINGS: Lower chest: Trace bilateral pleural effusions. Hepatobiliary: No solid liver abnormality is seen. No gallstones, gallbladder wall thickening, or biliary dilatation. Pancreas: Unremarkable. No pancreatic ductal dilatation or surrounding inflammatory changes. Spleen: Normal in size without significant abnormality. Adrenals/Urinary Tract: Adrenal glands are unremarkable. Kidneys are normal, without renal calculi, solid lesion, or hydronephrosis. Foley catheter in the bladder. Stomach/Bowel: Stomach is within normal limits. Appendix appears normal. No evidence of bowel wall thickening, distention, or inflammatory changes. Sigmoid diverticulosis. Vascular/Lymphatic: No significant vascular findings are present. No enlarged abdominal or pelvic lymph nodes. Reproductive: No mass or other significant abnormality. Other: Anasarca. Moderate volume ascites throughout the abdomen and pelvis, reduced compared to prior examination. There is a left-sided retroperitoneal hematoma, posterior to the left kidney, measuring 11.4 x 6.8 x 2.8 cm (series 3, image 44, series 7, image 141). Musculoskeletal: No acute or significant osseous findings. IMPRESSION: 1. There is a left-sided retroperitoneal hematoma, posterior to the left kidney, measuring 11.4 x 6.8 x 2.8 cm. 2. Moderate volume ascites throughout the abdomen and pelvis, reduced compared to prior examination. 3. Anasarca. These results will be called to the ordering clinician or representative by the Radiologist Assistant, and communication documented in the PACS or [employer]. Electronically Signed   By: Eddie Candle M.D.   On: 05/01/2020 12:02   CT ABDOMEN PELVIS WO CONTRAST  Result Date: 04/27/2020 CLINICAL DATA:  Congestive heart failure, tachycardia, dyspnea, abdominal distension, lower extremity edema EXAM: CT CHEST, ABDOMEN AND PELVIS WITHOUT CONTRAST TECHNIQUE:  Multidetector CT imaging of the chest, abdomen and pelvis was performed following the standard protocol without IV contrast. COMPARISON:  None. FINDINGS: CT CHEST FINDINGS Cardiovascular: Mild coronary artery calcification. Global cardiac size within normal limits. Small pericardial effusion. No CT evidence of cardiac tamponade. Central pulmonary arteries are of normal caliber. Thoracic aorta is unremarkable. Mediastinum/Nodes: Visualized thyroid unremarkable. No pathologic thoracic adenopathy. Esophagus unremarkable. Lungs/Pleura: Trace right and small left pleural effusions are present with mild bibasilar atelectasis. Lungs are otherwise clear. No pneumothorax. Central airways are widely patent. Musculoskeletal: No acute bone abnormality. No lytic or blastic bone lesion. CT ABDOMEN PELVIS FINDINGS Hepatobiliary: No focal liver abnormality is seen. No gallstones, gallbladder wall thickening, or biliary dilatation. Pancreas: Unremarkable Spleen: Unremarkable Adrenals/Urinary Tract: Adrenal glands are unremarkable. Kidneys are normal, without renal calculi, focal lesion, or hydronephrosis. Bladder is unremarkable. Stomach/Bowel: Moderate descending and sigmoid colonic diverticulosis. Stomach, small bowel, and large bowel are otherwise unremarkable. Appendix normal. No free intraperitoneal gas. Large volume ascites is present. Vascular/Lymphatic: No significant vascular findings are present. No enlarged abdominal or pelvic lymph nodes. Reproductive: Prostate is unremarkable. Other: Rectum unremarkable.  No abdominal wall hernia. Musculoskeletal: Degenerative changes are seen at the lumbosacral junction. No lytic or blastic bone lesion is identified. No acute bone abnormality. There is moderate subcutaneous edema noted within the  flanks and within the visualized lower extremities bilaterally. IMPRESSION: Large volume ascites and moderate subcutaneous edema involving the flanks and visualized lower extremities  bilaterally demonstrating a dependent, lower extremity gradient. While this can be seen in the setting of right heart failure, this is nonspecific. Trace bilateral pleural effusions. Moderate sigmoid diverticulosis. Electronically Signed   By: Fidela Salisbury MD   On: 04/27/2020 23:14   DG Abd 1 View  Result Date: 05/11/2020 CLINICAL DATA:  Follow-up ileus EXAM: ABDOMEN - 1 VIEW COMPARISON:  05/10/2020 abdominal radiograph FINDINGS: Diffuse moderate small bowel dilatation up to 5.5 cm diameter, stable to minimally improved. No evidence of pneumatosis or pneumoperitoneum. No radiopaque nephrolithiasis. IMPRESSION: Stable to minimally improved diffuse moderate small bowel dilatation, compatible with either ileus or partial distal small bowel obstruction. Electronically Signed   By: Ilona Sorrel M.D.   On: 05/11/2020 13:03   DG Abd 1 View  Result Date: 05/10/2020 CLINICAL DATA:  Ileus EXAM: ABDOMEN - 1 VIEW COMPARISON:  May 09, 2020 FINDINGS: There remains multiple loops of dilated small bowel without air-fluid levels. No free air. Probable phlebolith in right pelvis. IMPRESSION: Loops of dilated small bowel persist. Suspect ileus, although distal degree of bowel obstruction cannot be excluded. No free air evident on supine examination. Electronically Signed   By: Lowella Grip III M.D.   On: 05/10/2020 08:53   CT Chest Wo Contrast  Result Date: 04/27/2020 CLINICAL DATA:  Congestive heart failure, tachycardia, dyspnea, abdominal distension, lower extremity edema EXAM: CT CHEST, ABDOMEN AND PELVIS WITHOUT CONTRAST TECHNIQUE: Multidetector CT imaging of the chest, abdomen and pelvis was performed following the standard protocol without IV contrast. COMPARISON:  None. FINDINGS: CT CHEST FINDINGS Cardiovascular: Mild coronary artery calcification. Global cardiac size within normal limits. Small pericardial effusion. No CT evidence of cardiac tamponade. Central pulmonary arteries are of normal caliber.  Thoracic aorta is unremarkable. Mediastinum/Nodes: Visualized thyroid unremarkable. No pathologic thoracic adenopathy. Esophagus unremarkable. Lungs/Pleura: Trace right and small left pleural effusions are present with mild bibasilar atelectasis. Lungs are otherwise clear. No pneumothorax. Central airways are widely patent. Musculoskeletal: No acute bone abnormality. No lytic or blastic bone lesion. CT ABDOMEN PELVIS FINDINGS Hepatobiliary: No focal liver abnormality is seen. No gallstones, gallbladder wall thickening, or biliary dilatation. Pancreas: Unremarkable Spleen: Unremarkable Adrenals/Urinary Tract: Adrenal glands are unremarkable. Kidneys are normal, without renal calculi, focal lesion, or hydronephrosis. Bladder is unremarkable. Stomach/Bowel: Moderate descending and sigmoid colonic diverticulosis. Stomach, small bowel, and large bowel are otherwise unremarkable. Appendix normal. No free intraperitoneal gas. Large volume ascites is present. Vascular/Lymphatic: No significant vascular findings are present. No enlarged abdominal or pelvic lymph nodes. Reproductive: Prostate is unremarkable. Other: Rectum unremarkable.  No abdominal wall hernia. Musculoskeletal: Degenerative changes are seen at the lumbosacral junction. No lytic or blastic bone lesion is identified. No acute bone abnormality. There is moderate subcutaneous edema noted within the flanks and within the visualized lower extremities bilaterally. IMPRESSION: Large volume ascites and moderate subcutaneous edema involving the flanks and visualized lower extremities bilaterally demonstrating a dependent, lower extremity gradient. While this can be seen in the setting of right heart failure, this is nonspecific. Trace bilateral pleural effusions. Moderate sigmoid diverticulosis. Electronically Signed   By: Fidela Salisbury MD   On: 04/27/2020 23:14   US RENAL  Result Date: 04/29/2020 CLINICAL DATA:  Acute kidney injury. EXAM: RENAL / URINARY TRACT  ULTRASOUND COMPLETE COMPARISON:  CT scan 04/27/2020 FINDINGS: Right Kidney: Renal measurements: 11.1 x 4.2 x 5.1 = volume: 126 mL.  Echogenicity within normal limits. No mass or hydronephrosis visualized. Left Kidney: Renal measurements: 11.6 x 5.7 x 5.9 cm = volume: 202 mL. Echogenicity within normal limits. No mass or hydronephrosis visualized. Bladder: Appears normal for degree of bladder distention. Other: Ascites IMPRESSION: 1. No evidence for hydronephrosis. 2. Large volume ascites Electronically Signed   By: Misty Stanley M.D.   On: 04/29/2020 07:17   US Paracentesis  Result Date: 04/30/2020 INDICATION: Patient with history of lupus, acute kidney injury, microhematuria, proteinuria, lower extremity edema, recurrent ascites; request received for therapeutic paracentesis prior to random renal biopsy. EXAM: ULTRASOUND GUIDED THERAPEUTIC PARACENTESIS MEDICATIONS: 1% lidocaine to skin and subcutaneous tissue COMPLICATIONS: None immediate. PROCEDURE: Informed written consent was obtained from the patient after a discussion of the risks, benefits and alternatives to treatment. A timeout was performed prior to the initiation of the procedure. Initial ultrasound scanning demonstrates a large amount of ascites within the right lower abdominal quadrant. The right lower abdomen was prepped and draped in the usual sterile fashion. 1% lidocaine was used for local anesthesia. Following this, a 19 gauge, 10-cm, Yueh catheter was introduced. An ultrasound image was saved for documentation purposes. The paracentesis was performed. The catheter was removed and a dressing was applied. The patient tolerated the procedure well without immediate post procedural complication. FINDINGS: A total of approximately 5 liters of yellow fluid was removed. IMPRESSION: Successful ultrasound-guided therapeutic paracentesis yielding 5 liters of peritoneal fluid. Read by: Rowe Robert, PA-C Electronically Signed   By: Ruthann Cancer MD   On:  04/30/2020 12:44   US Paracentesis  Result Date: 04/28/2020 INDICATION: Patient with history of SLE, acute HF in fluid overload, abdominal distension, and ascites. Request is made for diagnostic and therapeutic paracentesis up to 2 L. EXAM: ULTRASOUND GUIDED DIAGNOSTIC AND THERAPEUTIC PARACENTESIS MEDICATIONS: 10 mL 1% lidocaine COMPLICATIONS: None immediate. PROCEDURE: Informed written consent was obtained from the patient after a discussion of the risks, benefits and alternatives to treatment. A timeout was performed prior to the initiation of the procedure. Initial ultrasound scanning demonstrates a large amount of ascites within the right lower abdominal quadrant. The right lower abdomen was prepped and draped in the usual sterile fashion. 1% lidocaine was used for local anesthesia. Following this, a 19 gauge, 7-cm, Yueh catheter was introduced. An ultrasound image was saved for documentation purposes. The paracentesis was performed. The catheter was removed and a dressing was applied. The patient tolerated the procedure well without immediate post procedural complication. Patient received post-procedure intravenous albumin; see nursing notes for details. FINDINGS: A total of approximately 2 L of clear yellow fluid was removed. Samples were sent to the laboratory as requested by the clinical team. IMPRESSION: Successful ultrasound-guided paracentesis yielding 2 L of peritoneal fluid. Read by: Earley Abide, PA-C Electronically Signed   By: Jacqulynn Cadet M.D.   On: 04/28/2020 11:17   IR Fluoro Guide CV Line Right  Result Date: 04/30/2020 INDICATION: 50 year old male with history of acute kidney injury on chronic kidney disease requiring central venous access for hemodialysis. EXAM: NON-TUNNELED CENTRAL VENOUS HEMODIALYSIS CATHETER PLACEMENT WITH ULTRASOUND AND FLUOROSCOPIC GUIDANCE COMPARISON:  None. MEDICATIONS: None. Moderate (conscious) sedation was employed during this procedure. A total of Versed  0 mg and Fentanyl 50 mcg was administered intravenously. Moderate Sedation Time: 11 minutes. The patient's level of consciousness and vital signs were monitored continuously by radiology nursing throughout the procedure under my direct supervision. FLUOROSCOPY TIME:  0 minutes, 6 seconds (1 mGy) COMPLICATIONS: None immediate. PROCEDURE: Informed written  consent was obtained from the patient after a discussion of the risks, benefits, and alternatives to treatment. Questions regarding the procedure were encouraged and answered. The right neck and chest were prepped with chlorhexidine in a sterile fashion, and a sterile drape was applied covering the operative field. Maximum barrier sterile technique with sterile gowns and gloves were used for the procedure. A timeout was performed prior to the initiation of the procedure. After the overlying soft tissues were anesthetized, a small venotomy incision was created and a micropuncture kit was utilized to access the internal jugular vein. Real-time ultrasound guidance was utilized for vascular access including the acquisition of a permanent ultrasound image documenting patency of the accessed vessel. The microwire was utilized to measure appropriate catheter length. A stiff glidewire was advanced to the level of the IVC. Under fluoroscopic guidance, the venotomy was serially dilated, ultimately allowing placement of a 16 cm temporary Trialysis catheter with tip ultimately terminating within the superior aspect of the right atrium. Final catheter positioning was confirmed and documented with a spot radiographic image. The catheter aspirates and flushes normally. The catheter was flushed with appropriate volume heparin dwells. The catheter exit site was secured with a 0-Prolene retention suture. A dressing was placed. The patient tolerated the procedure well without immediate post procedural complication. IMPRESSION: Successful placement of a right internal jugular approach  16 cm temporary dialysis catheter with tip terminating with in the right atrium. The catheter is ready for immediate use. PLAN: This catheter may be converted to a tunneled dialysis catheter at a later date as indicated. Ruthann Cancer, MD [facility] Electronically Signed   By: Ruthann Cancer MD   On: 04/30/2020 13:27   IR US Guide Vasc Access Right  Result Date: 05/03/2020 INDICATION: Acute kidney injury, access for dialysis EXAM: ULTRASOUND GUIDANCE FOR VASCULAR ACCESS RIGHT INTERNAL JUGULAR PERMANENT HEMODIALYSIS CATHETER Date:  05/03/2020 05/03/2020 4:16 pm Radiologist:  Jerilynn Mages. Daryll Brod, MD Guidance:  Ultrasound and fluoroscopic FLUOROSCOPY TIME:  Fluoroscopy Time: 1 minutes 0 seconds (17 mGy). MEDICATIONS: Ancef 2 g administered prior to the procedure ANESTHESIA/SEDATION: Versed 100 mg IV; Fentanyl 2.0 mcg IV; Moderate Sedation Time:  24 minutes The patient was continuously monitored during the procedure by the interventional radiology nurse under my direct supervision. CONTRAST:  None. COMPLICATIONS: None immediate. PROCEDURE: Informed consent was obtained from the patient following explanation of the procedure, risks, benefits and alternatives. The patient understands, agrees and consents for the procedure. All questions were addressed. A time out was performed. Maximal barrier sterile technique utilized including caps, mask, sterile gowns, sterile gloves, large sterile drape, hand hygiene, and 2% chlorhexidine scrub. Under sterile conditions and local anesthesia, right internal jugular micropuncture venous access was performed with ultrasound. Images were obtained for documentation of the patent right internal jugular vein. A guide wire was inserted followed by a transitional dilator. Next, a 0.035 guidewire was advanced into the IVC with a 5-French catheter. Measurements were obtained from the right venotomy site to the proximal right atrium. In the  right infraclavicular chest, a subcutaneous tunnel was created under sterile conditions and local anesthesia. 1% lidocaine with epinephrine was utilized for this. The 19 cm tip to cuff palindrome catheter was tunneled subcutaneously to the venotomy site and inserted into the SVC/RA junction through a valved peel-away sheath. Position was confirmed with fluoroscopy. Images were obtained for documentation. Blood was aspirated from the catheter followed by saline and heparin flushes. The appropriate volume and strength of heparin  was instilled in each lumen. Caps were applied. The catheter was secured at the tunnel site with Gelfoam and a pursestring suture. The venotomy site was closed with subcuticular Vicryl suture. Dermabond was applied to the small right neck incision. A dry sterile dressing was applied. The catheter is ready for use. No immediate complications. IMPRESSION: Ultrasound and fluoroscopically guided right internal jugular tunneled hemodialysis catheter (19 cm tip to cuff palindrome catheter). Electronically Signed   By: Jerilynn Mages.  Shick M.D.   On: 05/03/2020 16:33   IR US Guide Vasc Access Right  Result Date: 04/30/2020 INDICATION: 50 year old male with history of acute kidney injury on chronic kidney disease requiring central venous access for hemodialysis. EXAM: NON-TUNNELED CENTRAL VENOUS HEMODIALYSIS CATHETER PLACEMENT WITH ULTRASOUND AND FLUOROSCOPIC GUIDANCE COMPARISON:  None. MEDICATIONS: None. Moderate (conscious) sedation was employed during this procedure. A total of Versed 0 mg and Fentanyl 50 mcg was administered intravenously. Moderate Sedation Time: 11 minutes. The patient's level of consciousness and vital signs were monitored continuously by radiology nursing throughout the procedure under my direct supervision. FLUOROSCOPY TIME:  0 minutes, 6 seconds (1 mGy) COMPLICATIONS: None immediate. PROCEDURE: Informed written consent was obtained from the patient after a discussion of the risks,  benefits, and alternatives to treatment. Questions regarding the procedure were encouraged and answered. The right neck and chest were prepped with chlorhexidine in a sterile fashion, and a sterile drape was applied covering the operative field. Maximum barrier sterile technique with sterile gowns and gloves were used for the procedure. A timeout was performed prior to the initiation of the procedure. After the overlying soft tissues were anesthetized, a small venotomy incision was created and a micropuncture kit was utilized to access the internal jugular vein. Real-time ultrasound guidance was utilized for vascular access including the acquisition of a permanent ultrasound image documenting patency of the accessed vessel. The microwire was utilized to measure appropriate catheter length. A stiff glidewire was advanced to the level of the IVC. Under fluoroscopic guidance, the venotomy was serially dilated, ultimately allowing placement of a 16 cm temporary Trialysis catheter with tip ultimately terminating within the superior aspect of the right atrium. Final catheter positioning was confirmed and documented with a spot radiographic image. The catheter aspirates and flushes normally. The catheter was flushed with appropriate volume heparin dwells. The catheter exit site was secured with a 0-Prolene retention suture. A dressing was placed. The patient tolerated the procedure well without immediate post procedural complication. IMPRESSION: Successful placement of a right internal jugular approach 16 cm temporary dialysis catheter with tip terminating with in the right atrium. The catheter is ready for immediate use. PLAN: This catheter may be converted to a tunneled dialysis catheter at a later date as indicated. Ruthann Cancer, MD [facility] Electronically Signed   By: Ruthann Cancer MD   On: 04/30/2020 13:27   DG Chest Portable 1 View  Result Date:  04/27/2020 CLINICAL DATA:  Shortness of breath today.  History of CHF. EXAM: PORTABLE CHEST 1 VIEW COMPARISON:  11/01/2018 FINDINGS: Shallow inspiration with linear atelectasis in the lung bases, greater on the left. Heart size and pulmonary vascularity are probably normal for technique. No pleural effusions. No pneumothorax. Mediastinal contours appear intact. IMPRESSION: Shallow inspiration with linear atelectasis in the lung bases, greater on the left. Electronically Signed   By: Lucienne Capers M.D.   On: 04/27/2020 20:42   DG Abd Portable 1V  Result Date: 05/09/2020 CLINICAL DATA:  Follow-up of ileus EXAM:  PORTABLE ABDOMEN - 1 VIEW COMPARISON:  05/01/2020 CT FINDINGS: Single supine view of the abdomen and pelvis. Small bowel loops measure up to 4.5 cm, felt to be increased compared to the recent CT. No gross free intraperitoneal air. Moderate amount of ascending colonic stool. IMPRESSION: Mid small bowel dilatation, felt to be increased compared to 05/01/2020. Ileus versus low-grade partial small bowel obstruction. Electronically Signed   By: Abigail Miyamoto M.D.   On: 05/09/2020 15:49   ECHOCARDIOGRAM COMPLETE  Result Date: 04/28/2020    ECHOCARDIOGRAM REPORT   Patient Name:   MANTAJ CHAMBERLIN Date of Exam: 04/28/2020 Medical Rec #:  [id number]       Height:       71.0 in Accession #:    [id number]      Weight:       198.9 lb Date of Birth:  1971-01-14       BSA:          2.104 m Patient Age:    21 years        BP:           155/100 mmHg Patient Gender: M               HR:           84 bpm. Exam Location:  Inpatient Procedure: 2D Echo, 3D Echo, Cardiac Doppler, Color Doppler and Strain Analysis Indications:    Dyspnea R06.00  History:        Patient has prior history of Echocardiogram examinations, most                 recent 12/14/2019. Lupus- not on any medication. Chronic                 pancytopenia, hypothyroid. Shortness of breath, abdominal                 distention, and swelling of the legs for 3  weeks. Acute kidney                 injury with hyperkalemia and metabolic acidosis.  Sonographer:    Darlina Sicilian RDCS Referring Phys: [id number] [place]  1. Left ventricular ejection fraction, by estimation, is 55 to 60%. The left ventricle has normal function. The left ventricle has no regional wall motion abnormalities. There is moderate left ventricular hypertrophy. Left ventricular diastolic parameters were normal.  2. Right ventricular systolic function is normal. The right ventricular size is normal. Tricuspid regurgitation signal is inadequate for assessing PA pressure.  3. The mitral valve is grossly normal. Trivial mitral valve regurgitation.  4. The aortic valve is tricuspid. There is mild calcification of the aortic valve including nodular calcification of the tip of the noncoronary cusp with restricted motion. Cannot completely exclude small vegetation involving the noncoronary cusp, although could be acoustic shadowing. If there is high concern for endocarditis, a TEE can be considered. Aortic valve regurgitation is trivial.  5. The inferior vena cava is normal in size with <50% respiratory variability, suggesting right atrial pressure of 8 mmHg.  6. Pleural fluid/possible ascites noted. FINDINGS  Left Ventricle: Left ventricular ejection fraction, by estimation, is 55 to 60%. The left ventricle has normal function. The left ventricle has no regional wall motion abnormalities. The left ventricular internal cavity size was normal in size. There is  moderate left ventricular hypertrophy. Left ventricular diastolic parameters were normal. Right Ventricle: The right ventricular size is normal. No increase in right ventricular wall  thickness. Right ventricular systolic function is normal. Tricuspid regurgitation signal is inadequate for assessing PA pressure. Left Atrium: Left atrial size was normal in size. Right Atrium: Right atrial size was normal in size. Pericardium: There is no  evidence of pericardial effusion. Mitral Valve: The mitral valve is grossly normal. There is mild thickening of the mitral valve leaflet(s). Trivial mitral valve regurgitation. Tricuspid Valve: The tricuspid valve is grossly normal. Tricuspid valve regurgitation is mild. Aortic Valve: The aortic valve is tricuspid. There is mild calcification of the aortic valve. Aortic valve regurgitation is trivial. Pulmonic Valve: The pulmonic valve was grossly normal. Pulmonic valve regurgitation is trivial. Aorta: The aortic root is normal in size and structure. Venous: The inferior vena cava is normal in size with less than 50% respiratory variability, suggesting right atrial pressure of 8 mmHg. IAS/Shunts: No atrial level shunt detected by color flow Doppler.  LEFT VENTRICLE PLAX 2D LVIDd:         4.60 cm  Diastology LVIDs:         3.40 cm  LV e' medial:    8.49 cm/s LV PW:         1.30 cm  LV E/e' medial:  6.4 LV IVS:        1.80 cm  LV e' lateral:   10.40 cm/s LVOT diam:     2.60 cm  LV E/e' lateral: 5.2 LV SV:         121 LV SV Index:   57 LVOT Area:     5.31 cm                          3D Volume EF:                         3D EF:        58 %                         LV EDV:       169 ml                         LV ESV:       70 ml                         LV SV:        98 ml RIGHT VENTRICLE RV S prime:     15.80 cm/s TAPSE (M-mode): 2.2 cm LEFT ATRIUM             Index       RIGHT ATRIUM          Index LA diam:        3.50 cm 1.66 cm/m  RA Area:     9.47 cm LA Vol (A2C):   42.6 ml 20.25 ml/m RA Volume:   12.90 ml 6.13 ml/m LA Vol (A4C):   35.7 ml 16.97 ml/m LA Biplane Vol: 42.3 ml 20.11 ml/m  AORTIC VALVE LVOT Vmax:   112.00 cm/s LVOT Vmean:  77.300 cm/s LVOT VTI:    0.227 m  AORTA Ao Root diam: 3.80 cm Ao Asc diam:  3.50 cm MITRAL VALVE MV Area (PHT): 2.03 cm    SHUNTS MV Decel Time: 373 msec    Systemic VTI:  0.23 m MV E velocity: 54.40 cm/s  Systemic Diam:  2.60 cm MV A velocity: 81.80 cm/s MV E/A ratio:  0.67  Rozann Lesches MD Electronically signed by Rozann Lesches MD Signature Date/Time: 04/28/2020/2:47:01 PM    Final    US BIOPSY (KIDNEY)  Result Date: 04/30/2020 INDICATION: 50 year old male with acute kidney injury. EXAM: ULTRASOUND GUIDED RENAL BIOPSY COMPARISON:  None. MEDICATIONS: None. ANESTHESIA/SEDATION: Fentanyl 50 mcg IV; Versed 2 mg IV Total Moderate Sedation time: 10 minutes; The patient was continuously monitored during the procedure by the interventional radiology nurse under my direct supervision. COMPLICATIONS: None immediate. PROCEDURE: Informed written consent was obtained from the patient after a discussion of the risks, benefits and alternatives to treatment. The patient understands and consents the procedure. A timeout was performed prior to the initiation of the procedure. Ultrasound scanning was performed of the bilateral flanks. The inferior pole of the left kidney was selected for biopsy due to location and sonographic window. The procedure was planned. The operative site was prepped and draped in the usual sterile fashion. The overlying soft tissues were anesthetized with 1% lidocaine with epinephrine. A 16 gauge core needle biopsy device was advanced into the inferior cortex of the left kidney and 2 core biopsies were obtained under direct ultrasound guidance. Images were saved for documentation purposes. The biopsy device was removed and hemostasis was obtained with manual compression. Post procedural scanning was negative for significant post procedural hemorrhage or additional complication. A dressing was placed. The patient tolerated the procedure well without immediate post procedural complication. IMPRESSION: Technically successful ultrasound guided left renal biopsy. Ruthann Cancer, MD [facility] Radiology Electronically Signed   By: Ruthann Cancer MD   On: 04/30/2020 12:47   IR TUNNELED CENTRAL VENOUS CATHETER PLACEMENT  Result  Date: 05/03/2020 INDICATION: Acute kidney injury, access for dialysis EXAM: ULTRASOUND GUIDANCE FOR VASCULAR ACCESS RIGHT INTERNAL JUGULAR PERMANENT HEMODIALYSIS CATHETER Date:  05/03/2020 05/03/2020 4:16 pm Radiologist:  Jerilynn Mages. Daryll Brod, MD Guidance:  Ultrasound and fluoroscopic FLUOROSCOPY TIME:  Fluoroscopy Time: 1 minutes 0 seconds (17 mGy). MEDICATIONS: Ancef 2 g administered prior to the procedure ANESTHESIA/SEDATION: Versed 100 mg IV; Fentanyl 2.0 mcg IV; Moderate Sedation Time:  24 minutes The patient was continuously monitored during the procedure by the interventional radiology nurse under my direct supervision. CONTRAST:  None. COMPLICATIONS: None immediate. PROCEDURE: Informed consent was obtained from the patient following explanation of the procedure, risks, benefits and alternatives. The patient understands, agrees and consents for the procedure. All questions were addressed. A time out was performed. Maximal barrier sterile technique utilized including caps, mask, sterile gowns, sterile gloves, large sterile drape, hand hygiene, and 2% chlorhexidine scrub. Under sterile conditions and local anesthesia, right internal jugular micropuncture venous access was performed with ultrasound. Images were obtained for documentation of the patent right internal jugular vein. A guide wire was inserted followed by a transitional dilator. Next, a 0.035 guidewire was advanced into the IVC with a 5-French catheter. Measurements were obtained from the right venotomy site to the proximal right atrium. In the right infraclavicular chest, a subcutaneous tunnel was created under sterile conditions and local anesthesia. 1% lidocaine with epinephrine was utilized for this. The 19 cm tip to cuff palindrome catheter was tunneled subcutaneously to the venotomy site and inserted into the SVC/RA junction through a valved peel-away sheath. Position was confirmed with fluoroscopy. Images were obtained for documentation. Blood was  aspirated from the catheter followed by saline and heparin flushes. The appropriate volume and strength of heparin was instilled in each lumen. Caps  were applied. The catheter was secured at the tunnel site with Gelfoam and a pursestring suture. The venotomy site was closed with subcuticular Vicryl suture. Dermabond was applied to the small right neck incision. A dry sterile dressing was applied. The catheter is ready for use. No immediate complications. IMPRESSION: Ultrasound and fluoroscopically guided right internal jugular tunneled hemodialysis catheter (19 cm tip to cuff palindrome catheter). Electronically Signed   By: Jerilynn Mages.  Shick M.D.   On: 05/03/2020 16:33   IR Paracentesis  Result Date: 05/10/2020 INDICATION: History of heart failure with volume overload. Request for therapeutic paracentesis up to 5 L max. EXAM: ULTRASOUND GUIDED RIGHT LOWER QUADRANT PARACENTESIS MEDICATIONS: None. COMPLICATIONS: None immediate. PROCEDURE: Informed written consent was obtained from the patient after a discussion of the risks, benefits and alternatives to treatment. A timeout was performed prior to the initiation of the procedure. Initial ultrasound scanning demonstrates a large amount of ascites within the right lower abdominal quadrant. The right lower abdomen was prepped and draped in the usual sterile fashion. 1% lidocaine was used for local anesthesia. Following this, a 19 gauge, 7-cm, Yueh catheter was introduced. An ultrasound image was saved for documentation purposes. The paracentesis was performed. The catheter was removed and a dressing was applied. The patient tolerated the procedure well without immediate post procedural complication. FINDINGS: A total of approximately 2.8 L of clear yellow fluid was removed. IMPRESSION: Successful ultrasound-guided paracentesis yielding 2.8 liters of peritoneal fluid. Read by: Ascencion Dike PA-C Electronically Signed   By: Jacqulynn Cadet M.D.   On: 05/10/2020 15:48   IR  Paracentesis  Result Date: 05/07/2020 INDICATION: Patient with history of SLE, AKI, acute HF in fluid overload, abdominal distension, and recurrent ascites. Request is made for therapeutic paracentesis up to 4 L. EXAM: ULTRASOUND GUIDED THERAPEUTIC PARACENTESIS MEDICATIONS: 10 mL 1% lidocaine COMPLICATIONS: None immediate. PROCEDURE: Informed written consent was obtained from the patient after a discussion of the risks, benefits and alternatives to treatment. A timeout was performed prior to the initiation of the procedure. Initial ultrasound scanning demonstrates a moderate amount of ascites within the right lower abdominal quadrant. The right lower abdomen was prepped and draped in the usual sterile fashion. 1% lidocaine was used for local anesthesia. Following this, a 19 gauge, 7-cm, Yueh catheter was introduced. An ultrasound image was saved for documentation purposes. The paracentesis was performed. The catheter was removed and a dressing was applied. The patient tolerated the procedure well without immediate post procedural complication. Patient received post-procedure intravenous albumin; see nursing notes for details. FINDINGS: A total of approximately 4 L of clear yellow fluid was removed. IMPRESSION: Successful ultrasound-guided paracentesis yielding 4 L of peritoneal fluid. Read by: Earley Abide, PA-C Electronically Signed   By: Jacqulynn Cadet M.D.   On: 05/07/2020 10:23     Paracentesis 1/2, 1/4,  1/11, and 1/14  Renal biopsy 1/4    Subjective: He reports feeling much better. Denies dyspnea. Had a BM earlier and his abdominal discomfort is better.   Discharge Exam: Vitals:   05/11/20 1058 05/11/20 1141  BP:  118/71  Pulse:    Resp: 15   Temp:  98.4 F (36.9 C)  SpO2:  98%   Vitals:   05/11/20 1040 05/11/20 1043 05/11/20 1058 05/11/20 1141  BP: 113/74 117/75  118/71  Pulse:      Resp:   15   Temp: 98.2 F (36.8 C) 98.2 F (36.8 C)  98.4 F (36.9 C)  TempSrc:    Oral  SpO2:  98%  98%  Weight:      Height:        General: Pt is alert, awake, not in acute distress Cardiovascular: RRR, S1/S2 + Respiratory: No cough, no respiratory distress Abdominal: Soft, mildly distended, NT Extremities: BL LE edema, no cyanosis    The results of significant diagnostics from this hospitalization (including imaging, microbiology, ancillary and laboratory) are listed below for reference.     Microbiology: No results found for this or any previous visit (from the past 240 hour(s)).   Labs: BNP (last 3 results) Recent Labs    04/27/20 2016  BNP 27.5   Basic Metabolic Panel: Recent Labs  Lab 05/05/20 0737 05/06/20 0649 05/07/20 0546 05/08/20 0044 05/09/20 0026 05/10/20 1035 05/11/20 0756  NA 133*   < > 133* 130* 134* 131* 133*  K 3.8   < > 3.7 4.0 3.6 3.8 3.5  CL 98   < > 97* 95* 98 93* 97*  CO2 26   < > $R'25 23 25 25 26  'sL$ GLUCOSE 123*   < > 102* 127* 151* 88 80  BUN 53*   < > 53* 68* 41* 66* 50*  CREATININE 4.64*   < > 5.03* 6.07* 4.52* 6.12* 5.47*  CALCIUM 7.6*   < > 7.7* 7.6* 7.6* 7.7* 7.3*  PHOS 5.9*  --   --   --   --  9.0* 6.4*   < > = values in this interval not displayed.   Liver Function Tests: Recent Labs  Lab 05/05/20 0737 05/10/20 1035 05/11/20 0756  ALBUMIN 2.0* 1.9* 2.3*   No results for input(s): LIPASE, AMYLASE in the last 168 hours. No results for input(s): AMMONIA in the last 168 hours. CBC: Recent Labs  Lab 05/08/20 0044 05/09/20 0026 05/10/20 1035 05/11/20 0756 05/11/20 1412  WBC 12.1* 10.4 9.1 6.1 7.0  HGB 9.3* 8.3* 7.5* 6.3* 8.1*  HCT 28.1* 24.6* 22.9* 19.9* 24.7*  MCV 89.2 89.5 91.6 93.0 91.1  PLT 196 183 181 126* 129*   Cardiac Enzymes: No results for input(s): CKTOTAL, CKMB, CKMBINDEX, TROPONINI in the last 168 hours. BNP: Invalid input(s): POCBNP CBG: No results for input(s): GLUCAP in the last 168 hours. D-Dimer No results for input(s): DDIMER in the last 72 hours. Hgb A1c No results for input(s):  HGBA1C in the last 72 hours. Lipid Profile No results for input(s): CHOL, HDL, LDLCALC, TRIG, CHOLHDL, LDLDIRECT in the last 72 hours. Thyroid function studies No results for input(s): TSH, T4TOTAL, T3FREE, THYROIDAB in the last 72 hours.  Invalid input(s): FREET3 Anemia work up No results for input(s): VITAMINB12, FOLATE, FERRITIN, TIBC, IRON, RETICCTPCT in the last 72 hours. Urinalysis    Component Value Date/Time   COLORURINE YELLOW 04/28/2020 0017   APPEARANCEUR CLOUDY (A) 04/28/2020 0017   LABSPEC 1.015 04/28/2020 0017   PHURINE 5.0 04/28/2020 0017   GLUCOSEU NEGATIVE 04/28/2020 0017   HGBUR MODERATE (A) 04/28/2020 0017   BILIRUBINUR NEGATIVE 04/28/2020 0017   KETONESUR NEGATIVE 04/28/2020 0017   PROTEINUR >=300 (A) 04/28/2020 0017   NITRITE NEGATIVE 04/28/2020 0017   LEUKOCYTESUR LARGE (A) 04/28/2020 0017   Sepsis Labs Invalid input(s): PROCALCITONIN,  WBC,  LACTICIDVEN Microbiology No results found for this or any previous visit (from the past 240 hour(s)).   Time coordinating discharge: Over 45 minutes  SIGNED:   Blain Pais, MD  [facility] 05/11/2020, 5:09 PM Pager   If 7PM-7AM, please contact night-coverage [email or web address] Password [email or web address]

## 2020-05-12 LAB — BPAM RBC
Blood Product Expiration Date: 202202142359
ISSUE DATE / TIME: 202201151005
Unit Type and Rh: 5100

## 2020-05-12 LAB — TYPE AND SCREEN
ABO/RH(D): O POS
Antibody Screen: NEGATIVE
Unit division: 0

## 2020-05-13 ENCOUNTER — Telehealth: Payer: Self-pay | Admitting: Nurse Practitioner

## 2020-05-13 NOTE — Telephone Encounter (Signed)
Transition of care contact from inpatient facility  Date of Discharge: 05/11/2020 Date of Contact: 05/13/20 Method of contact: Phone  Attempted to contact patient to discuss transition of care from inpatient admission. Patient did not answer the phone. Message was left on the patient's voicemail with call back number [phone].

## 2020-05-14 DIAGNOSIS — M3214 Glomerular disease in systemic lupus erythematosus: Secondary | ICD-10-CM | POA: Diagnosis not present

## 2020-05-14 DIAGNOSIS — N2581 Secondary hyperparathyroidism of renal origin: Secondary | ICD-10-CM | POA: Diagnosis not present

## 2020-05-14 DIAGNOSIS — N179 Acute kidney failure, unspecified: Secondary | ICD-10-CM | POA: Diagnosis not present

## 2020-05-14 DIAGNOSIS — N186 End stage renal disease: Secondary | ICD-10-CM | POA: Diagnosis not present

## 2020-05-14 DIAGNOSIS — Z992 Dependence on renal dialysis: Secondary | ICD-10-CM | POA: Diagnosis not present

## 2020-05-16 DIAGNOSIS — N179 Acute kidney failure, unspecified: Secondary | ICD-10-CM | POA: Diagnosis not present

## 2020-05-16 DIAGNOSIS — Z992 Dependence on renal dialysis: Secondary | ICD-10-CM | POA: Diagnosis not present

## 2020-05-16 DIAGNOSIS — N2581 Secondary hyperparathyroidism of renal origin: Secondary | ICD-10-CM | POA: Diagnosis not present

## 2020-05-18 DIAGNOSIS — Z992 Dependence on renal dialysis: Secondary | ICD-10-CM | POA: Diagnosis not present

## 2020-05-18 DIAGNOSIS — N2581 Secondary hyperparathyroidism of renal origin: Secondary | ICD-10-CM | POA: Diagnosis not present

## 2020-05-18 DIAGNOSIS — N179 Acute kidney failure, unspecified: Secondary | ICD-10-CM | POA: Diagnosis not present

## 2020-05-21 DIAGNOSIS — Z992 Dependence on renal dialysis: Secondary | ICD-10-CM | POA: Diagnosis not present

## 2020-05-21 DIAGNOSIS — N2581 Secondary hyperparathyroidism of renal origin: Secondary | ICD-10-CM | POA: Diagnosis not present

## 2020-05-21 DIAGNOSIS — N179 Acute kidney failure, unspecified: Secondary | ICD-10-CM | POA: Diagnosis not present

## 2020-05-23 ENCOUNTER — Ambulatory Visit: Payer: BC Managed Care – PPO | Admitting: Family Medicine

## 2020-05-23 DIAGNOSIS — N179 Acute kidney failure, unspecified: Secondary | ICD-10-CM | POA: Diagnosis not present

## 2020-05-23 DIAGNOSIS — N2581 Secondary hyperparathyroidism of renal origin: Secondary | ICD-10-CM | POA: Diagnosis not present

## 2020-05-23 DIAGNOSIS — Z992 Dependence on renal dialysis: Secondary | ICD-10-CM | POA: Diagnosis not present

## 2020-05-25 DIAGNOSIS — N2581 Secondary hyperparathyroidism of renal origin: Secondary | ICD-10-CM | POA: Diagnosis not present

## 2020-05-25 DIAGNOSIS — N179 Acute kidney failure, unspecified: Secondary | ICD-10-CM | POA: Diagnosis not present

## 2020-05-25 DIAGNOSIS — Z992 Dependence on renal dialysis: Secondary | ICD-10-CM | POA: Diagnosis not present

## 2020-05-28 ENCOUNTER — Other Ambulatory Visit: Payer: Self-pay | Admitting: Nephrology

## 2020-05-28 ENCOUNTER — Other Ambulatory Visit (HOSPITAL_COMMUNITY): Payer: Self-pay | Admitting: Nephrology

## 2020-05-28 DIAGNOSIS — R188 Other ascites: Secondary | ICD-10-CM

## 2020-05-28 DIAGNOSIS — N2581 Secondary hyperparathyroidism of renal origin: Secondary | ICD-10-CM | POA: Diagnosis not present

## 2020-05-28 DIAGNOSIS — N179 Acute kidney failure, unspecified: Secondary | ICD-10-CM | POA: Diagnosis not present

## 2020-05-28 DIAGNOSIS — Z992 Dependence on renal dialysis: Secondary | ICD-10-CM | POA: Diagnosis not present

## 2020-05-29 ENCOUNTER — Telehealth: Payer: Self-pay | Admitting: Nephrology

## 2020-05-29 DIAGNOSIS — D72819 Decreased white blood cell count, unspecified: Secondary | ICD-10-CM | POA: Diagnosis not present

## 2020-05-29 DIAGNOSIS — M1990 Unspecified osteoarthritis, unspecified site: Secondary | ICD-10-CM | POA: Diagnosis not present

## 2020-05-29 DIAGNOSIS — D696 Thrombocytopenia, unspecified: Secondary | ICD-10-CM | POA: Diagnosis not present

## 2020-05-29 DIAGNOSIS — M329 Systemic lupus erythematosus, unspecified: Secondary | ICD-10-CM | POA: Diagnosis not present

## 2020-05-30 DIAGNOSIS — N179 Acute kidney failure, unspecified: Secondary | ICD-10-CM | POA: Diagnosis not present

## 2020-05-30 DIAGNOSIS — N2581 Secondary hyperparathyroidism of renal origin: Secondary | ICD-10-CM | POA: Diagnosis not present

## 2020-05-30 DIAGNOSIS — Z992 Dependence on renal dialysis: Secondary | ICD-10-CM | POA: Diagnosis not present

## 2020-06-01 DIAGNOSIS — N2581 Secondary hyperparathyroidism of renal origin: Secondary | ICD-10-CM | POA: Diagnosis not present

## 2020-06-01 DIAGNOSIS — Z992 Dependence on renal dialysis: Secondary | ICD-10-CM | POA: Diagnosis not present

## 2020-06-01 DIAGNOSIS — N179 Acute kidney failure, unspecified: Secondary | ICD-10-CM | POA: Diagnosis not present

## 2020-06-03 ENCOUNTER — Other Ambulatory Visit: Payer: Self-pay

## 2020-06-03 ENCOUNTER — Ambulatory Visit (HOSPITAL_COMMUNITY)
Admission: RE | Admit: 2020-06-03 | Discharge: 2020-06-03 | Disposition: A | Discharge status: Home / Self Care | Payer: BC Managed Care – PPO | Source: Ambulatory Visit | Attending: Nephrology | Admitting: Nephrology

## 2020-06-03 DIAGNOSIS — R188 Other ascites: Secondary | ICD-10-CM | POA: Insufficient documentation

## 2020-06-03 DIAGNOSIS — K7460 Unspecified cirrhosis of liver: Secondary | ICD-10-CM | POA: Diagnosis not present

## 2020-06-03 HISTORY — PX: IR PARACENTESIS: IMG2679

## 2020-06-03 MED ORDER — LIDOCAINE HCL 1 % IJ SOLN
INTRAMUSCULAR | Status: DC | PRN
  Administered 2020-06-03: 10 mL

## 2020-06-03 MED ORDER — LIDOCAINE HCL 1 % IJ SOLN
INTRAMUSCULAR | Status: AC
  Filled 2020-06-03: qty 20

## 2020-06-03 NOTE — Procedures (Signed)
PROCEDURE SUMMARY:  Successful US guided paracentesis from right lateral abdomen.  Yielded 3.6 liters of clear, yellow fluid.  No immediate complications.  Pt tolerated well.   Specimen was not sent for labs.  EBL < 24m  KDocia BarrierPA-C 06/03/2020 1:28 PM

## 2020-06-04 DIAGNOSIS — Z992 Dependence on renal dialysis: Secondary | ICD-10-CM | POA: Diagnosis not present

## 2020-06-04 DIAGNOSIS — N2581 Secondary hyperparathyroidism of renal origin: Secondary | ICD-10-CM | POA: Diagnosis not present

## 2020-06-04 DIAGNOSIS — N179 Acute kidney failure, unspecified: Secondary | ICD-10-CM | POA: Diagnosis not present

## 2020-06-06 DIAGNOSIS — N2581 Secondary hyperparathyroidism of renal origin: Secondary | ICD-10-CM | POA: Diagnosis not present

## 2020-06-06 DIAGNOSIS — N179 Acute kidney failure, unspecified: Secondary | ICD-10-CM | POA: Diagnosis not present

## 2020-06-06 DIAGNOSIS — Z992 Dependence on renal dialysis: Secondary | ICD-10-CM | POA: Diagnosis not present

## 2020-06-08 DIAGNOSIS — Z992 Dependence on renal dialysis: Secondary | ICD-10-CM | POA: Diagnosis not present

## 2020-06-08 DIAGNOSIS — N179 Acute kidney failure, unspecified: Secondary | ICD-10-CM | POA: Diagnosis not present

## 2020-06-08 DIAGNOSIS — N2581 Secondary hyperparathyroidism of renal origin: Secondary | ICD-10-CM | POA: Diagnosis not present

## 2020-06-11 DIAGNOSIS — N2581 Secondary hyperparathyroidism of renal origin: Secondary | ICD-10-CM | POA: Diagnosis not present

## 2020-06-11 DIAGNOSIS — Z992 Dependence on renal dialysis: Secondary | ICD-10-CM | POA: Diagnosis not present

## 2020-06-11 DIAGNOSIS — N179 Acute kidney failure, unspecified: Secondary | ICD-10-CM | POA: Diagnosis not present

## 2020-06-13 DIAGNOSIS — N179 Acute kidney failure, unspecified: Secondary | ICD-10-CM | POA: Diagnosis not present

## 2020-06-13 DIAGNOSIS — Z992 Dependence on renal dialysis: Secondary | ICD-10-CM | POA: Diagnosis not present

## 2020-06-13 DIAGNOSIS — N2581 Secondary hyperparathyroidism of renal origin: Secondary | ICD-10-CM | POA: Diagnosis not present

## 2020-06-15 DIAGNOSIS — Z992 Dependence on renal dialysis: Secondary | ICD-10-CM | POA: Diagnosis not present

## 2020-06-15 DIAGNOSIS — N179 Acute kidney failure, unspecified: Secondary | ICD-10-CM | POA: Diagnosis not present

## 2020-06-15 DIAGNOSIS — N2581 Secondary hyperparathyroidism of renal origin: Secondary | ICD-10-CM | POA: Diagnosis not present

## 2020-06-18 DIAGNOSIS — N2581 Secondary hyperparathyroidism of renal origin: Secondary | ICD-10-CM | POA: Diagnosis not present

## 2020-06-18 DIAGNOSIS — Z992 Dependence on renal dialysis: Secondary | ICD-10-CM | POA: Diagnosis not present

## 2020-06-18 DIAGNOSIS — N179 Acute kidney failure, unspecified: Secondary | ICD-10-CM | POA: Diagnosis not present

## 2020-06-20 ENCOUNTER — Other Ambulatory Visit: Payer: Self-pay | Admitting: Nephrology

## 2020-06-20 DIAGNOSIS — Z992 Dependence on renal dialysis: Secondary | ICD-10-CM | POA: Diagnosis not present

## 2020-06-20 DIAGNOSIS — R188 Other ascites: Secondary | ICD-10-CM

## 2020-06-20 DIAGNOSIS — N2581 Secondary hyperparathyroidism of renal origin: Secondary | ICD-10-CM | POA: Diagnosis not present

## 2020-06-20 DIAGNOSIS — N179 Acute kidney failure, unspecified: Secondary | ICD-10-CM | POA: Diagnosis not present

## 2020-06-21 ENCOUNTER — Encounter (HOSPITAL_COMMUNITY): Payer: BC Managed Care – PPO

## 2020-06-22 DIAGNOSIS — N2581 Secondary hyperparathyroidism of renal origin: Secondary | ICD-10-CM | POA: Diagnosis not present

## 2020-06-22 DIAGNOSIS — Z992 Dependence on renal dialysis: Secondary | ICD-10-CM | POA: Diagnosis not present

## 2020-06-22 DIAGNOSIS — N179 Acute kidney failure, unspecified: Secondary | ICD-10-CM | POA: Diagnosis not present

## 2020-06-24 ENCOUNTER — Ambulatory Visit (HOSPITAL_COMMUNITY)
Admission: RE | Admit: 2020-06-24 | Discharge: 2020-06-24 | Disposition: A | Discharge status: Home / Self Care | Payer: BC Managed Care – PPO | Source: Ambulatory Visit | Attending: Nephrology | Admitting: Nephrology

## 2020-06-24 ENCOUNTER — Other Ambulatory Visit: Payer: Self-pay

## 2020-06-24 DIAGNOSIS — R188 Other ascites: Secondary | ICD-10-CM | POA: Diagnosis not present

## 2020-06-24 HISTORY — PX: IR PARACENTESIS: IMG2679

## 2020-06-24 MED ORDER — LIDOCAINE HCL 1 % IJ SOLN
INTRAMUSCULAR | Status: AC
  Filled 2020-06-24: qty 20

## 2020-06-24 MED ORDER — LIDOCAINE HCL (PF) 1 % IJ SOLN
INTRAMUSCULAR | Status: AC | PRN
  Administered 2020-06-24: 10 mL

## 2020-06-24 NOTE — Procedures (Signed)
PROCEDURE SUMMARY:  Successful image-guided paracentesis from the right lower abdomen.  Yielded 3.1 liters of clear yellow fluid.  No immediate complications.  EBL = trace. Patient tolerated well.   Specimen was not sent for labs.  Please see imaging section of Epic for full dictation.   Armando Gang Tanisha Lutes PA-C 06/24/2020 10:17 AM

## 2020-06-25 ENCOUNTER — Emergency Department (HOSPITAL_COMMUNITY)
Admission: EM | Admit: 2020-06-25 | Discharge: 2020-06-26 | Disposition: A | Discharge status: Home / Self Care | Payer: BC Managed Care – PPO | Attending: Emergency Medicine | Admitting: Emergency Medicine

## 2020-06-25 DIAGNOSIS — N1831 Chronic kidney disease, stage 3a: Secondary | ICD-10-CM | POA: Insufficient documentation

## 2020-06-25 DIAGNOSIS — R000 Tachycardia, unspecified: Secondary | ICD-10-CM | POA: Diagnosis not present

## 2020-06-25 DIAGNOSIS — Z20822 Contact with and (suspected) exposure to covid-19: Secondary | ICD-10-CM | POA: Diagnosis not present

## 2020-06-25 DIAGNOSIS — D61818 Other pancytopenia: Secondary | ICD-10-CM | POA: Insufficient documentation

## 2020-06-25 DIAGNOSIS — I509 Heart failure, unspecified: Secondary | ICD-10-CM | POA: Diagnosis not present

## 2020-06-25 DIAGNOSIS — N2581 Secondary hyperparathyroidism of renal origin: Secondary | ICD-10-CM | POA: Diagnosis not present

## 2020-06-25 DIAGNOSIS — R188 Other ascites: Secondary | ICD-10-CM | POA: Diagnosis not present

## 2020-06-25 DIAGNOSIS — Z79899 Other long term (current) drug therapy: Secondary | ICD-10-CM | POA: Insufficient documentation

## 2020-06-25 DIAGNOSIS — E039 Hypothyroidism, unspecified: Secondary | ICD-10-CM | POA: Insufficient documentation

## 2020-06-25 DIAGNOSIS — N179 Acute kidney failure, unspecified: Secondary | ICD-10-CM | POA: Diagnosis not present

## 2020-06-25 DIAGNOSIS — D649 Anemia, unspecified: Secondary | ICD-10-CM | POA: Diagnosis not present

## 2020-06-25 DIAGNOSIS — Z992 Dependence on renal dialysis: Secondary | ICD-10-CM | POA: Diagnosis not present

## 2020-06-25 LAB — SARS CORONAVIRUS 2 (TAT 6-24 HRS): SARS Coronavirus 2: NEGATIVE

## 2020-06-25 LAB — CBC WITH DIFFERENTIAL/PLATELET
Abs Immature Granulocytes: 0.06 10*3/uL (ref 0.00–0.07)
Basophils Absolute: 0 10*3/uL (ref 0.0–0.1)
Basophils Relative: 0 %
Eosinophils Absolute: 0 10*3/uL (ref 0.0–0.5)
Eosinophils Relative: 0 %
HCT: 22.9 % — ABNORMAL LOW (ref 39.0–52.0)
Hemoglobin: 7.4 g/dL — ABNORMAL LOW (ref 13.0–17.0)
Immature Granulocytes: 4 %
Lymphocytes Relative: 19 %
Lymphs Abs: 0.3 10*3/uL — ABNORMAL LOW (ref 0.7–4.0)
MCH: 29.1 pg (ref 26.0–34.0)
MCHC: 32.3 g/dL (ref 30.0–36.0)
MCV: 90.2 fL (ref 80.0–100.0)
Monocytes Absolute: 0.2 10*3/uL (ref 0.1–1.0)
Monocytes Relative: 9 %
Neutro Abs: 1.2 10*3/uL — ABNORMAL LOW (ref 1.7–7.7)
Neutrophils Relative %: 68 %
Platelets: 93 10*3/uL — ABNORMAL LOW (ref 150–400)
RBC: 2.54 MIL/uL — ABNORMAL LOW (ref 4.22–5.81)
RDW: 14.9 % (ref 11.5–15.5)
WBC: 1.7 10*3/uL — ABNORMAL LOW (ref 4.0–10.5)
nRBC: 0 % (ref 0.0–0.2)

## 2020-06-25 LAB — BASIC METABOLIC PANEL
Anion gap: 8 (ref 5–15)
BUN: 16 mg/dL (ref 6–20)
CO2: 31 mmol/L (ref 22–32)
Calcium: 7 mg/dL — ABNORMAL LOW (ref 8.9–10.3)
Chloride: 95 mmol/L — ABNORMAL LOW (ref 98–111)
Creatinine, Ser: 2.38 mg/dL — ABNORMAL HIGH (ref 0.61–1.24)
GFR, Estimated: 32 mL/min — ABNORMAL LOW (ref >60)
Glucose, Bld: 137 mg/dL — ABNORMAL HIGH (ref 70–99)
Potassium: 3.7 mmol/L (ref 3.5–5.1)
Sodium: 134 mmol/L — ABNORMAL LOW (ref 135–145)

## 2020-06-25 LAB — POC OCCULT BLOOD, ED: Fecal Occult Bld: NEGATIVE

## 2020-06-25 LAB — IRON AND TIBC
Iron: 24 ug/dL — ABNORMAL LOW (ref 45–182)
Saturation Ratios: 15 % — ABNORMAL LOW (ref 17.9–39.5)
TIBC: 165 ug/dL — ABNORMAL LOW (ref 250–450)
UIBC: 141 ug/dL

## 2020-06-25 LAB — CBG MONITORING, ED: Glucose-Capillary: 128 mg/dL — ABNORMAL HIGH (ref 70–99)

## 2020-06-25 LAB — PREPARE RBC (CROSSMATCH)

## 2020-06-25 LAB — VITAMIN B12: Vitamin B-12: 544 pg/mL (ref 180–914)

## 2020-06-25 LAB — FERRITIN: Ferritin: 2150 ng/mL — ABNORMAL HIGH (ref 24–336)

## 2020-06-25 MED ORDER — SODIUM CHLORIDE 0.9 % IV SOLN
10.0000 mL/h | Freq: Once | INTRAVENOUS | Status: AC
  Administered 2020-06-25: 10 mL/h via INTRAVENOUS

## 2020-06-25 NOTE — ED Provider Notes (Signed)
[place] EMERGENCY DEPARTMENT Provider Note   CSN: [id number] Arrival date & time: 06/25/20  1623     History Chief Complaint  Patient presents with  . low hgb    [name] is a 50 y.o. male.  The history is provided by the patient and medical records. No language interpreter was used.     50 year old male significant history of lupus, alcohol abuse, CHF, malnutrition, received regular IR paracentesis most recent was 06/24/2020, sent here via EMS from dialysis center due to low hemoglobin.  Patient report he does have history of anemia secondary to his chronic kidney disease.  For the past week he endorsed progressive worsening generalized fatigue which he attributes to decrease in his hemoglobin.  He has received blood transfusion in the past.  Today after a dialysis center getting his dialysis, mention about his fatigue and hemoglobin obtained showing a hemoglobin of 6.8.  Patient was sent here for further care.  He had approximately 6 minutes left of the session for today.  He also report having a paracentesis yesterday in which 3.1 L of fluid was removed.  He denies any significant abdominal pain.  Denies any fever, chills, chest pain.  He report likely very small amount of urine.  He denies any recent alcohol or drug use.  He does note shortness of breath and generalized weakness due to his anemia state.  He has not been vaccinated for COVID-19.  Past Medical History:  Diagnosis Date  . Lupus [facility])     Patient Active Problem List   Diagnosis Date Noted  . Malnutrition of moderate degree 05/01/2020  . Acute heart failure with preserved ejection fraction (HFpEF) ([place]) 04/27/2020  . Stage 3a chronic kidney disease ([place]) 01/15/2020  . Acquired hypothyroidism 11/27/2019  . Cardiac murmur 11/24/2019  . Congestive heart failure ([place]) 11/06/2019  . Elevated TSH 11/06/2019  . Folate deficiency 04/11/2019  . B12 deficiency 04/11/2019  . Pancytopenia ([place])  02/06/2019  . Alcohol use 02/06/2019  . Elevated LFTs 02/06/2019  . Systemic lupus erythematosus with lung involvement ([place]) 12/05/2018  . Vitamin D deficiency 12/05/2018  . False positive interferon-gamma release assay (IGRA) for tuberculosis 12/05/2018  . Neurotic excoriations 11/01/2018  . Encounter for health maintenance examination with abnormal findings 11/01/2018  . Polyarthralgia 06/11/2015  . Pleural effusion   . S/P thoracentesis   . Lupus ([place]) 03/10/2015  . Dyspnea 03/10/2015  . Lupus (systemic lupus erythematosus) ([place]) 03/09/2015    Past Surgical History:  Procedure Laterality Date  . IR FLUORO GUIDE CV LINE RIGHT  04/30/2020  . IR PARACENTESIS  05/07/2020  . IR PARACENTESIS  05/10/2020  . IR PARACENTESIS  06/03/2020  . IR PARACENTESIS  06/24/2020  . IR PERC TUN PERIT CATH WO PORT S&I Dartha Lodge  05/03/2020  . IR US GUIDE VASC ACCESS RIGHT  04/30/2020  . IR US GUIDE VASC ACCESS RIGHT  05/03/2020       Family History  Problem Relation Age of Onset  . Cancer Mother     Social History   Tobacco Use  . Smoking status: Never Smoker  . Smokeless tobacco: Never Used  Vaping Use  . Vaping Use: Never used  Substance Use Topics  . Alcohol use: Yes    Comment: 2-4 glasses of wine 2-3 times weekly  . Drug use: No    Home Medications Prior to Admission medications   Medication Sig Start Date End Date Taking? Authorizing Provider  acetaminophen (TYLENOL) 325 MG tablet  Take 2 tablets (650 mg total) by mouth every 4 (four) hours as needed for headache or mild pain. 05/11/20   Blain Pais, MD  Cyanocobalamin (B-12) 1000 MCG CAPS Take one daily Patient not taking: Reported on 04/27/2020 11/27/19   [name], MD  famotidine (PEPCID) 20 MG tablet Take 1 tablet (20 mg total) by mouth daily. 05/11/20 07/10/20  Blain Pais, MD  folic acid (FOLVITE) 1 MG tablet Take 1 tablet by mouth once daily 01/16/20   [name], MD  lactulose [facility]) 10 GM/15ML  solution Take 30 mLs (20 g total) by mouth 2 (two) times daily as needed for moderate constipation. y. 05/11/20   Blain Pais, MD  levothyroxine (SYNTHROID) 50 MCG tablet TAKE 1 TABLET BY MOUTH ONCE DAILY BEFORE BREAKFAST 01/16/20   [name], MD  Nutritional Supplements (,FEEDING SUPPLEMENT, PROSOURCE PLUS) liquid Take 30 mLs by mouth 3 (three) times daily between meals. 05/11/20   Blain Pais, MD  Nutritional Supplements (FEEDING SUPPLEMENT, NEPRO CARB STEADY,) LIQD Take 237 mLs by mouth daily. 05/11/20   Blain Pais, MD  predniSONE (DELTASONE) 20 MG tablet Take 3 tablets (60 mg total) by mouth daily with breakfast for 22 days, THEN 2 tablets (40 mg total) daily with breakfast. Do not stop taking this medication abruptly. Follow up with your Nephrologist to discuss when to discontinue this medication.. 05/12/20 07/03/20  Blain Pais, MD  vitamin B-12 (CYANOCOBALAMIN) 1000 MCG tablet Take 1,000 mcg by mouth daily. 04/09/20   [provider]  Vitamin D, Ergocalciferol, (DRISDOL) 1.25 MG (50000 UNIT) CAPS capsule Take 1 capsule (50,000 Units total) by mouth every 7 (seven) days. 11/12/19   [name], MD    Allergies    Patient has no known allergies.  Review of Systems   Review of Systems  All other systems reviewed and are negative.   Physical Exam Updated Vital Signs BP 108/69   Pulse (!) 103   Temp 99 F (37.2 C) (Oral)   Resp 19   Ht 6' (1.829 m)   Wt 72.6 kg   SpO2 99%   BMI 21.70 kg/m   Physical Exam Vitals and nursing note reviewed.  Constitutional:      General: He is not in acute distress.    Appearance: He is well-developed and well-nourished.     Comments: Chronically ill male appears to be in no acute discomfort.  HENT:     Head: Atraumatic.  Eyes:     Conjunctiva/sclera: Conjunctivae normal.  Cardiovascular:     Rate and Rhythm: Tachycardia present.     Pulses: Normal pulses.     Heart sounds: Murmur  heard.    Pulmonary:     Effort: Pulmonary effort is normal.     Breath sounds: Rales (Crackles heard at both lung bases.) present.  Abdominal:     General: There is distension.     Palpations: Abdomen is soft.     Tenderness: There is no abdominal tenderness.     Comments: Site of recent paracentesis to R lower abdomen without signs of infection and no significant hematoma  Musculoskeletal:     Cervical back: Normal range of motion and neck supple.     Right lower leg: Edema present.     Left lower leg: Edema present.  Skin:    General: Skin is warm.     Coloration: Skin is pale.     Findings: No rash.  Neurological:  Mental Status: He is alert and oriented to person, place, and time.  Psychiatric:        Mood and Affect: Mood and affect and mood normal.     ED Results / Procedures / Treatments   Labs (all labs ordered are listed, but only abnormal results are displayed) Labs Reviewed  BASIC METABOLIC PANEL - Abnormal; Notable for the following components:      Result Value   Sodium 134 (*)    Chloride 95 (*)    Glucose, Bld 137 (*)    Creatinine, Ser 2.38 (*)    Calcium 7.0 (*)    GFR, Estimated 32 (*)    All other components within normal limits  IRON AND TIBC - Abnormal; Notable for the following components:   Iron 24 (*)    TIBC 165 (*)    Saturation Ratios 15 (*)    All other components within normal limits  FERRITIN - Abnormal; Notable for the following components:   Ferritin 2,150 (*)    All other components within normal limits  CBC WITH DIFFERENTIAL/PLATELET - Abnormal; Notable for the following components:   WBC 1.7 (*)    RBC 2.54 (*)    Hemoglobin 7.4 (*)    HCT 22.9 (*)    Platelets 93 (*)    Neutro Abs 1.2 (*)    Lymphs Abs 0.3 (*)    All other components within normal limits  CBG MONITORING, ED - Abnormal; Notable for the following components:   Glucose-Capillary 128 (*)    All other components within normal limits  SARS CORONAVIRUS 2 (TAT  6-24 HRS)  VITAMIN B12  URINALYSIS, ROUTINE W REFLEX MICROSCOPIC  FOLATE  RETICULOCYTES  POC OCCULT BLOOD, ED  POC OCCULT BLOOD, ED  TYPE AND SCREEN  PREPARE RBC (CROSSMATCH)    EKG EKG Interpretation  Date/Time:  Tuesday June 25 2020 16:31:56 EST Ventricular Rate:  128 PR Interval:    QRS Duration: 83 QT Interval:  316 QTC Calculation: 462 R Axis:   -3 Text Interpretation: Sinus tachycardia Probable left atrial enlargement Consider anterior infarct Borderline repolarization abnormality No significant change since last tracing Confirmed by Gareth Morgan [phone]) on 06/25/2020 4:41:21 PM   Radiology IR Paracentesis  Result Date: 06/24/2020 INDICATION: History of cirrhosis, recurrent ascites. Request for therapeutic paracentesis, no maximum. EXAM: ULTRASOUND GUIDED THERAPEUTIC PARACENTESIS MEDICATIONS: 10 mL 1% lidocaine COMPLICATIONS: None immediate. PROCEDURE: Informed written consent was obtained from the patient after a discussion of the risks, benefits and alternatives to treatment. A timeout was performed prior to the initiation of the procedure. Initial ultrasound scanning demonstrates a large amount of ascites within the right lower abdominal quadrant. The right lower abdomen was prepped and draped in the usual sterile fashion. 1% lidocaine was used for local anesthesia. Following this, a 19 gauge, 7-cm, Yueh catheter was introduced. An ultrasound image was saved for documentation purposes. The paracentesis was performed. The catheter was removed and a dressing was applied. The patient tolerated the procedure well without immediate post procedural complication. FINDINGS: A total of approximately 3.1 L of clear yellow fluid was removed. IMPRESSION: Successful ultrasound-guided paracentesis yielding 3.1 liters of peritoneal fluid. Read by: Durenda Guthrie, PA-C Electronically Signed   By: Lucrezia Europe M.D.   On: 06/24/2020 10:55    Procedures .Critical Care Performed by: Domenic Moras,  PA-C Authorized by: Domenic Moras, PA-C   Critical care provider statement:    Critical care time (minutes):  32   Critical care was time spent  personally by me on the following activities:  Discussions with consultants, evaluation of patient's response to treatment, examination of patient, ordering and performing treatments and interventions, ordering and review of laboratory studies, ordering and review of radiographic studies, pulse oximetry, re-evaluation of patient's condition, obtaining history from patient or surrogate and review of old charts     Medications Ordered in ED Medications  0.9 %  sodium chloride infusion (10 mL/hr Intravenous New Bag/Given 06/25/20 1941)    ED Course  I have reviewed the triage vital signs and the nursing notes.  Pertinent labs & imaging results that were available during my care of the patient were reviewed by me and considered in my medical decision making (see chart for details).    MDM Rules/Calculators/A&P                          BP 108/75   Pulse (!) 110   Temp 99 F (37.2 C) (Oral)   Resp (!) 24   Ht 6' (1.829 m)   Wt 72.6 kg   SpO2 100%   BMI 21.70 kg/m   Final Clinical Impression(s) / ED Diagnoses Final diagnoses:  Symptomatic anemia  Pancytopenia ([place])    Rx / DC Orders ED Discharge Orders    None     Patient brought here due to complaints of generalized weakness as well as having hemoglobin of 6.8 that was obtained at his dialysis center today.  He had his dialysis except for the last 6 minutes.  He has history of anemia and did mention he received blood transfusion in the past.  Anemia secondary to chronic kidney disease.  Baseline hemoglobin is usually around 8-9.  He denies any abnormal bleeding.  he recently had an IR guided abdominal paracentesis done yesterday to remove fluid from his ascites.  He denies any significant pain from the procedure.  Abdomen without signs concerning for SBP. On initial exam, patient was  tachycardic with heart rate of 130, EKG shows sinus tachycardia.  I suspect tachycardia is likely due recent dialysis session from today as well as thoracentesis from yesterday..  Digital rectal exam without any black stool, Hemoccult is negative.  Potassium is normal, creatinine is 2.38, improved to prior.  Patient recently started on dialysis low over a month ago and he still make small amount of urine.  Repeat hemoglobin is 7.4, patient was giving 1 unit of packed red blood cells.  Patient's preference is to go home after blood infusion.  His heart rate did improve to 110 after patient receiving blood product.  Blood work showed evidence of pancytopenia.  It appears patient has had pancytopenia in the past.  I did offer pt to be admitted since he is tachycardic and mildly hypotensive along with pancytopenia.  Pt prefers to go home after blood transfusion.  He denies any infectious sxs. I discussed care with Dr. Billy Fischer who will reassess pt once he have received his blood product and prior to dispo.     Domenic Moras, PA-C 06/25/20 2129    Gareth Morgan, MD 06/28/20 [phone]

## 2020-06-25 NOTE — ED Triage Notes (Signed)
Pt bib fema from diaylsis center for low hgb (6.8). Pt reports no symptoms other than weakness. Pt did have paracentesis yesterday where they removed 3.1L of fluid. Pt started dialysis about a month ago and had 6 minutes left on his session today when they stopped it. VSS.   HR 130 BP: 112/77 RR: 16 98% RA

## 2020-06-25 NOTE — ED Provider Notes (Incomplete)
  Physical Exam  BP 120/85   Pulse (!) 108   Temp 98.8 F (37.1 C) (Oral)   Resp (!) 24   Ht 6' (1.829 m)   Wt 72.6 kg   SpO2 97%   BMI 21.70 kg/m   Physical Exam  ED Course/Procedures     Procedures  MDM  ***

## 2020-06-26 LAB — TYPE AND SCREEN
ABO/RH(D): O POS
Antibody Screen: NEGATIVE
Unit division: 0

## 2020-06-26 LAB — BPAM RBC
Blood Product Expiration Date: 202204012359
ISSUE DATE / TIME: 202203011922
Unit Type and Rh: 5100

## 2020-06-27 DIAGNOSIS — Z992 Dependence on renal dialysis: Secondary | ICD-10-CM | POA: Diagnosis not present

## 2020-06-27 DIAGNOSIS — N179 Acute kidney failure, unspecified: Secondary | ICD-10-CM | POA: Diagnosis not present

## 2020-06-27 DIAGNOSIS — N2581 Secondary hyperparathyroidism of renal origin: Secondary | ICD-10-CM | POA: Diagnosis not present

## 2020-06-28 ENCOUNTER — Encounter (HOSPITAL_COMMUNITY): Payer: BC Managed Care – PPO

## 2020-06-29 DIAGNOSIS — N2581 Secondary hyperparathyroidism of renal origin: Secondary | ICD-10-CM | POA: Diagnosis not present

## 2020-06-29 DIAGNOSIS — Z992 Dependence on renal dialysis: Secondary | ICD-10-CM | POA: Diagnosis not present

## 2020-06-29 DIAGNOSIS — N179 Acute kidney failure, unspecified: Secondary | ICD-10-CM | POA: Diagnosis not present

## 2020-07-01 ENCOUNTER — Telehealth: Payer: Self-pay | Admitting: Family Medicine

## 2020-07-01 NOTE — Telephone Encounter (Signed)
Patient is calling to get a refill on his Levothyroxine. If approved, please send to [employer] and call [phone] to let him know that it has been sent in.

## 2020-07-01 NOTE — Telephone Encounter (Signed)
Patient calling for refill on Levothyroxine per patient he had not been consistent with taking medication daily  last refill September 2021 quantity of 15 patient not sure when was the last time he took this medication.  Patient has an upcoming appointment 07/03/20 please advise.

## 2020-07-02 ENCOUNTER — Other Ambulatory Visit: Payer: Self-pay

## 2020-07-02 ENCOUNTER — Other Ambulatory Visit (HOSPITAL_COMMUNITY): Payer: Self-pay | Admitting: Nephrology

## 2020-07-02 ENCOUNTER — Other Ambulatory Visit: Payer: Self-pay | Admitting: Nephrology

## 2020-07-02 DIAGNOSIS — Z992 Dependence on renal dialysis: Secondary | ICD-10-CM | POA: Diagnosis not present

## 2020-07-02 DIAGNOSIS — N179 Acute kidney failure, unspecified: Secondary | ICD-10-CM | POA: Diagnosis not present

## 2020-07-02 DIAGNOSIS — N2581 Secondary hyperparathyroidism of renal origin: Secondary | ICD-10-CM | POA: Diagnosis not present

## 2020-07-02 DIAGNOSIS — R188 Other ascites: Secondary | ICD-10-CM

## 2020-07-02 DIAGNOSIS — R7989 Other specified abnormal findings of blood chemistry: Secondary | ICD-10-CM

## 2020-07-02 MED ORDER — LEVOTHYROXINE SODIUM 50 MCG PO TABS: 50.0000 ug | ORAL_TABLET | Freq: Every day | ORAL | 0 refills | Status: DC

## 2020-07-02 NOTE — Telephone Encounter (Signed)
We'll just restart it then.

## 2020-07-02 NOTE — Telephone Encounter (Signed)
Rx refilled.

## 2020-07-03 ENCOUNTER — Telehealth (INDEPENDENT_AMBULATORY_CARE_PROVIDER_SITE_OTHER): Payer: BC Managed Care – PPO | Admitting: Family Medicine

## 2020-07-03 ENCOUNTER — Encounter: Payer: Self-pay | Admitting: Family Medicine

## 2020-07-03 VITALS — Ht 72.0 in | Wt 160.0 lb

## 2020-07-03 DIAGNOSIS — E559 Vitamin D deficiency, unspecified: Secondary | ICD-10-CM

## 2020-07-03 DIAGNOSIS — R634 Abnormal weight loss: Secondary | ICD-10-CM

## 2020-07-03 DIAGNOSIS — E039 Hypothyroidism, unspecified: Secondary | ICD-10-CM | POA: Diagnosis not present

## 2020-07-03 DIAGNOSIS — Z992 Dependence on renal dialysis: Secondary | ICD-10-CM | POA: Insufficient documentation

## 2020-07-03 DIAGNOSIS — N186 End stage renal disease: Secondary | ICD-10-CM

## 2020-07-03 DIAGNOSIS — M3213 Lung involvement in systemic lupus erythematosus: Secondary | ICD-10-CM

## 2020-07-03 DIAGNOSIS — D61818 Other pancytopenia: Secondary | ICD-10-CM | POA: Diagnosis not present

## 2020-07-03 MED ORDER — VITAMIN D (ERGOCALCIFEROL) 1.25 MG (50000 UNIT) PO CAPS: 50000.0000 [IU] | ORAL_CAPSULE | ORAL | 5 refills | Status: DC

## 2020-07-03 NOTE — Progress Notes (Signed)
Established Patient Office Visit  Subjective:  Patient ID: [name], male    DOB: 10/27/1970  Age: 50 y.o. MRN: [id number]  CC:  Chief Complaint  Patient presents with  . Follow-up    Follow up patient seen at ED on 06/25/20 for kidney failure.      HPI [name] presents for follow-up of hypothyroidism and vitamin D deficiency.  He is currently out of his vitamin D.  He has been out of his levothyroxine and will restart that again today.  Unfortunately things have not gone well for him since he was last seen here.  He has now been on chronic dialysis for the last 2 to 3 months.  He has suffered weight loss and a chronic pancytopenia that has required multiple transfusions over the last few months.  He denies any significant reflux, melena, hematochezia, hematuria.  His iron levels have been low with an elevated ferritin level.  Hematology consultation in the past is suggested anemia of chronic disease exacerbated by his then use of excessive alcohol.  He has not had anything to drink since September of this past year.  He is not using illicit drugs.  He admits that he has not been eating well.  He is seeing rheumatology for lupus and was recently started on CellCept.  Past Medical History:  Diagnosis Date  . Lupus [facility])     Past Surgical History:  Procedure Laterality Date  . IR FLUORO GUIDE CV LINE RIGHT  04/30/2020  . IR PARACENTESIS  05/07/2020  . IR PARACENTESIS  05/10/2020  . IR PARACENTESIS  06/03/2020  . IR PARACENTESIS  06/24/2020  . IR PERC TUN PERIT CATH WO PORT S&I Dartha [facility]  05/03/2020  . IR US GUIDE VASC ACCESS RIGHT  04/30/2020  . IR US GUIDE VASC ACCESS RIGHT  05/03/2020    Family History  Problem Relation Age of Onset  . Cancer Mother     Social History   Socioeconomic History  . Marital status: Single    Spouse name: Not on file  . Number of children: Not on file  . Years of education: Not on file  . Highest education level: Not on file  Occupational History   . Not on file  Tobacco Use  . Smoking status: Never Smoker  . Smokeless tobacco: Never Used  Vaping Use  . Vaping Use: Never used  Substance and Sexual Activity  . Alcohol use: Yes    Comment: 2-4 glasses of wine 2-3 times weekly  . Drug use: No  . Sexual activity: Not on file  Other Topics Concern  . Not on file  Social History Narrative  . Not on file   Social Determinants of Health   Financial Resource Strain: Not on file  Food Insecurity: Not on file  Transportation Needs: Not on file  Physical Activity: Not on file  Stress: Not on file  Social Connections: Not on file  Intimate Partner Violence: Not on file    Outpatient Medications Prior to Visit  Medication Sig Dispense Refill  . famotidine (PEPCID) 20 MG tablet Take 1 tablet (20 mg total) by mouth daily. 60 tablet 0  . folic acid (FOLVITE) 1 MG tablet Take 1 tablet by mouth once daily 90 tablet 0  . lactulose (CHRONULAC) 10 GM/15ML solution Take 30 mLs (20 g total) by mouth 2 (two) times daily as needed for moderate constipation. y. 236 mL 0  . levothyroxine (SYNTHROID) 50 MCG tablet Take 1 tablet (  50 mcg total) by mouth daily before breakfast. 30 tablet 0  . Nutritional Supplements (,FEEDING SUPPLEMENT, PROSOURCE PLUS) liquid Take 30 mLs by mouth 3 (three) times daily between meals. 887 mL 0  . Nutritional Supplements (FEEDING SUPPLEMENT, NEPRO CARB STEADY,) LIQD Take 237 mLs by mouth daily.  0  . predniSONE (DELTASONE) 20 MG tablet Take 3 tablets (60 mg total) by mouth daily with breakfast for 22 days, THEN 2 tablets (40 mg total) daily with breakfast. Do not stop taking this medication abruptly. Follow up with your Nephrologist to discuss when to discontinue this medication.. 126 tablet 0  . vitamin B-12 (CYANOCOBALAMIN) 1000 MCG tablet Take 1,000 mcg by mouth daily.    Marland Kitchen acetaminophen (TYLENOL) 325 MG tablet Take 2 tablets (650 mg total) by mouth every 4 (four) hours as needed for headache or mild pain.    .  Cyanocobalamin (B-12) 1000 MCG CAPS Take one daily (Patient not taking: Reported on 07/03/2020) 90 capsule 1  . mycophenolate (CELLCEPT) 250 MG capsule 6 capsules    . Vitamin D, Ergocalciferol, (DRISDOL) 1.25 MG (50000 UNIT) CAPS capsule Take 1 capsule (50,000 Units total) by mouth every 7 (seven) days. (Patient not taking: Reported on 07/03/2020) 5 capsule 5   No facility-administered medications prior to visit.    No Known Allergies  ROS Review of Systems  Constitutional: Positive for fatigue. Negative for diaphoresis and unexpected weight change.  HENT: Negative.   Eyes: Negative for photophobia and visual disturbance.  Respiratory: Positive for shortness of breath.   Cardiovascular: Negative for chest pain.  Gastrointestinal: Negative for anal bleeding and blood in stool.  Genitourinary: Negative for hematuria.  Musculoskeletal: Negative for gait problem and joint swelling.  Skin: Positive for rash.  Allergic/Immunologic: Positive for immunocompromised state.  Neurological: Positive for weakness.  Psychiatric/Behavioral: Negative.       Objective:    Physical Exam Constitutional:      General: He is not in acute distress.    Appearance: He is ill-appearing. He is not toxic-appearing or diaphoretic.  HENT:     Head: Normocephalic.  Eyes:     Conjunctiva/sclera: Conjunctivae normal.  Pulmonary:     Effort: Pulmonary effort is normal.  Neurological:     Mental Status: He is alert and oriented to person, place, and time.  Psychiatric:        Mood and Affect: Mood normal.        Behavior: Behavior normal.     Ht 6' (1.829 m)   Wt 160 lb (72.6 kg)   BMI 21.70 kg/m  Wt Readings from Last 3 Encounters:  07/03/20 160 lb (72.6 kg)  06/25/20 160 lb (72.6 kg)  05/11/20 159 lb 2.8 oz (72.2 kg)     Health Maintenance Due  Topic Date Due  . COLONOSCOPY (Pts 45-68yr Insurance coverage will need to be confirmed)  Never done    There are no preventive care reminders to  display for this patient.  Lab Results  Component Value Date   TSH 7.851 (H) 04/28/2020   Lab Results  Component Value Date   WBC 1.7 (L) 06/25/2020   HGB 7.4 (L) 06/25/2020   HCT 22.9 (L) 06/25/2020   MCV 90.2 06/25/2020   PLT 93 (L) 06/25/2020   Lab Results  Component Value Date   NA 134 (L) 06/25/2020   K 3.7 06/25/2020   CO2 31 06/25/2020   GLUCOSE 137 (H) 06/25/2020   BUN 16 06/25/2020   CREATININE 2.38 (H) 06/25/2020  BILITOT 0.4 05/03/2020   ALKPHOS 64 05/03/2020   AST 18 05/03/2020   ALT 20 05/03/2020   PROT 4.6 (L) 05/03/2020   ALBUMIN 2.3 (L) 05/11/2020   CALCIUM 7.0 (L) 06/25/2020   ANIONGAP 8 06/25/2020   GFR 48.73 (L) 01/08/2020   Lab Results  Component Value Date   CHOL 85 11/01/2018   Lab Results  Component Value Date   HDL 16.20 (L) 11/01/2018   Lab Results  Component Value Date   LDLCALC 44 11/01/2018   Lab Results  Component Value Date   TRIG 126.0 11/01/2018   Lab Results  Component Value Date   CHOLHDL 5 11/01/2018   No results found for: HGBA1C    Assessment & Plan:   Problem List Items Addressed This Visit      Respiratory   Systemic lupus erythematosus with lung involvement ([place])     Endocrine   Acquired hypothyroidism     Genitourinary   Stage 5 chronic kidney disease on chronic dialysis ([place])     Hematopoietic and Hemostatic   Pancytopenia ([place])     Other   Vitamin D deficiency - Primary   Relevant Medications   Vitamin D, Ergocalciferol, (DRISDOL) 1.25 MG (50000 UNIT) CAPS capsule   Weight loss      Meds ordered this encounter  Medications  . Vitamin D, Ergocalciferol, (DRISDOL) 1.25 MG (50000 UNIT) CAPS capsule    Sig: Take 1 capsule (50,000 Units total) by mouth every 7 (seven) days.    Dispense:  5 capsule    Refill:  5    Follow-up: Return in about 2 months (around 09/02/2020).   Follow-up in 2 months after starting levothyroxine.  Continue high-dose weekly vitamin D and advised him to alert  nephrology we are continuing high-dose vitamin D. [name], MD

## 2020-07-04 DIAGNOSIS — N2581 Secondary hyperparathyroidism of renal origin: Secondary | ICD-10-CM | POA: Diagnosis not present

## 2020-07-04 DIAGNOSIS — N179 Acute kidney failure, unspecified: Secondary | ICD-10-CM | POA: Diagnosis not present

## 2020-07-04 DIAGNOSIS — Z992 Dependence on renal dialysis: Secondary | ICD-10-CM | POA: Diagnosis not present

## 2020-07-06 DIAGNOSIS — N2581 Secondary hyperparathyroidism of renal origin: Secondary | ICD-10-CM | POA: Diagnosis not present

## 2020-07-06 DIAGNOSIS — Z992 Dependence on renal dialysis: Secondary | ICD-10-CM | POA: Diagnosis not present

## 2020-07-06 DIAGNOSIS — N186 End stage renal disease: Secondary | ICD-10-CM | POA: Diagnosis not present

## 2020-07-09 DIAGNOSIS — N186 End stage renal disease: Secondary | ICD-10-CM | POA: Diagnosis not present

## 2020-07-09 DIAGNOSIS — N2581 Secondary hyperparathyroidism of renal origin: Secondary | ICD-10-CM | POA: Diagnosis not present

## 2020-07-09 DIAGNOSIS — Z992 Dependence on renal dialysis: Secondary | ICD-10-CM | POA: Diagnosis not present

## 2020-07-11 DIAGNOSIS — Z992 Dependence on renal dialysis: Secondary | ICD-10-CM | POA: Diagnosis not present

## 2020-07-11 DIAGNOSIS — N2581 Secondary hyperparathyroidism of renal origin: Secondary | ICD-10-CM | POA: Diagnosis not present

## 2020-07-11 DIAGNOSIS — N186 End stage renal disease: Secondary | ICD-10-CM | POA: Diagnosis not present

## 2020-07-13 DIAGNOSIS — N186 End stage renal disease: Secondary | ICD-10-CM | POA: Diagnosis not present

## 2020-07-13 DIAGNOSIS — Z992 Dependence on renal dialysis: Secondary | ICD-10-CM | POA: Diagnosis not present

## 2020-07-13 DIAGNOSIS — N2581 Secondary hyperparathyroidism of renal origin: Secondary | ICD-10-CM | POA: Diagnosis not present

## 2020-07-15 ENCOUNTER — Emergency Department (HOSPITAL_COMMUNITY)
Admission: EM | Admit: 2020-07-15 | Discharge: 2020-07-15 | Disposition: A | Discharge status: Home / Self Care | Payer: BC Managed Care – PPO | Attending: Emergency Medicine | Admitting: Emergency Medicine

## 2020-07-15 ENCOUNTER — Ambulatory Visit (HOSPITAL_COMMUNITY)
Admission: RE | Admit: 2020-07-15 | Discharge: 2020-07-15 | Disposition: A | Discharge status: Home / Self Care | Payer: BC Managed Care – PPO | Source: Ambulatory Visit | Attending: Nephrology | Admitting: Nephrology

## 2020-07-15 ENCOUNTER — Encounter (HOSPITAL_COMMUNITY): Payer: Self-pay

## 2020-07-15 ENCOUNTER — Ambulatory Visit (HOSPITAL_COMMUNITY): Admission: RE | Admit: 2020-07-15 | Payer: BC Managed Care – PPO | Source: Ambulatory Visit

## 2020-07-15 ENCOUNTER — Other Ambulatory Visit (HOSPITAL_COMMUNITY): Payer: Self-pay | Admitting: Nephrology

## 2020-07-15 DIAGNOSIS — S0121XA Laceration without foreign body of nose, initial encounter: Secondary | ICD-10-CM | POA: Insufficient documentation

## 2020-07-15 DIAGNOSIS — R188 Other ascites: Secondary | ICD-10-CM

## 2020-07-15 DIAGNOSIS — N185 Chronic kidney disease, stage 5: Secondary | ICD-10-CM | POA: Diagnosis not present

## 2020-07-15 DIAGNOSIS — W01198A Fall on same level from slipping, tripping and stumbling with subsequent striking against other object, initial encounter: Secondary | ICD-10-CM | POA: Insufficient documentation

## 2020-07-15 DIAGNOSIS — E039 Hypothyroidism, unspecified: Secondary | ICD-10-CM | POA: Diagnosis not present

## 2020-07-15 DIAGNOSIS — S0990XA Unspecified injury of head, initial encounter: Secondary | ICD-10-CM | POA: Diagnosis not present

## 2020-07-15 DIAGNOSIS — Z992 Dependence on renal dialysis: Secondary | ICD-10-CM | POA: Diagnosis not present

## 2020-07-15 DIAGNOSIS — I509 Heart failure, unspecified: Secondary | ICD-10-CM | POA: Insufficient documentation

## 2020-07-15 DIAGNOSIS — Z79899 Other long term (current) drug therapy: Secondary | ICD-10-CM | POA: Diagnosis not present

## 2020-07-15 DIAGNOSIS — S0181XA Laceration without foreign body of other part of head, initial encounter: Secondary | ICD-10-CM | POA: Diagnosis not present

## 2020-07-15 DIAGNOSIS — W19XXXA Unspecified fall, initial encounter: Secondary | ICD-10-CM

## 2020-07-15 HISTORY — PX: IR PARACENTESIS: IMG2679

## 2020-07-15 MED ORDER — LIDOCAINE HCL 1 % IJ SOLN
INTRAMUSCULAR | Status: AC
  Filled 2020-07-15: qty 20

## 2020-07-15 MED ORDER — LIDOCAINE HCL (PF) 1 % IJ SOLN
INTRAMUSCULAR | Status: AC | PRN
  Administered 2020-07-15: 10 mL

## 2020-07-15 NOTE — Procedures (Signed)
PROCEDURE SUMMARY:  Successful image-guided paracentesis from the right lateral abdomen.  Yielded 2.6 liters of clear yellow fluid.  No immediate complications.  EBL = 0 mL. Patient tolerated well.   Specimen was not sent for labs.  Please see imaging section of Epic for full dictation.   Claris Pong Raianna Slight PA-C 07/15/2020 11:29 AM

## 2020-07-15 NOTE — Discharge Instructions (Signed)
As discussed, today's evaluation has been generally reassuring.  The wound closure adhesive will fall off at the appropriate time.  Do not hesitate to return here for any concerning changes in your condition.

## 2020-07-15 NOTE — ED Triage Notes (Signed)
Here for paracentesis, fell coming into building. States "I get off balance when I have so much fluid". Approx 1.5 cm lac to bridge of nose and over right eye brow. No active bleeding. Denies LOC.

## 2020-07-15 NOTE — ED Provider Notes (Signed)
[facility] EMERGENCY DEPARTMENT Provider Note   CSN: [id number] Arrival date & time: 07/15/20  0957     History Chief Complaint  Patient presents with  . Fall    [name] is a 50 y.o. male.  HPI Patient presents after mechanical fall with pain in his face around several small wounds. Patient was preparing to go to his paracentesis, which was previously scheduled, given his history of heart failure, CKD, lupus.  He notes that he stumbled, struck his head on the floor.  No loss of consciousness, no subsequent true head pain, no neck pain, no weakness anywhere, and the facial pain he had following the fall has resolved.  However, given presence of several wounds he was sent here for evaluation.    Past Medical History:  Diagnosis Date  . Lupus [facility])     Patient Active Problem List   Diagnosis Date Noted  . Stage 5 chronic kidney disease on chronic dialysis ([place]) 07/03/2020  . Weight loss 07/03/2020  . Malnutrition of moderate degree 05/01/2020  . Acute heart failure with preserved ejection fraction (HFpEF) ([place]) 04/27/2020  . Stage 3a chronic kidney disease ([place]) 01/15/2020  . Acquired hypothyroidism 11/27/2019  . Cardiac murmur 11/24/2019  . Congestive heart failure ([place]) 11/06/2019  . Elevated TSH 11/06/2019  . Folate deficiency 04/11/2019  . B12 deficiency 04/11/2019  . Pancytopenia ([place]) 02/06/2019  . Alcohol use 02/06/2019  . Elevated LFTs 02/06/2019  . Systemic lupus erythematosus with lung involvement ([place]) 12/05/2018  . Vitamin D deficiency 12/05/2018  . False positive interferon-gamma release assay (IGRA) for tuberculosis 12/05/2018  . Neurotic excoriations 11/01/2018  . Encounter for health maintenance examination with abnormal findings 11/01/2018  . Polyarthralgia 06/11/2015  . Pleural effusion   . S/P thoracentesis   . Lupus ([place]) 03/10/2015  . Dyspnea 03/10/2015  . Lupus (systemic lupus erythematosus) ([place]) 03/09/2015    Past  Surgical History:  Procedure Laterality Date  . IR FLUORO GUIDE CV LINE RIGHT  04/30/2020  . IR PARACENTESIS  05/07/2020  . IR PARACENTESIS  05/10/2020  . IR PARACENTESIS  06/03/2020  . IR PARACENTESIS  06/24/2020  . IR PERC TUN PERIT CATH WO PORT S&I [facility]  05/03/2020  . IR US GUIDE VASC ACCESS RIGHT  04/30/2020  . IR US GUIDE VASC ACCESS RIGHT  05/03/2020       Family History  Problem Relation Age of Onset  . Cancer Mother     Social History   Tobacco Use  . Smoking status: Never Smoker  . Smokeless tobacco: Never Used  Vaping Use  . Vaping Use: Never used  Substance Use Topics  . Alcohol use: Yes    Comment: 2-4 glasses of wine 2-3 times weekly  . Drug use: No    Home Medications Prior to Admission medications   Medication Sig Start Date End Date Taking? Authorizing Provider  acetaminophen (TYLENOL) 325 MG tablet Take 2 tablets (650 mg total) by mouth every 4 (four) hours as needed for headache or mild pain. 05/11/20   Blain Pais, MD  Cyanocobalamin (B-12) 1000 MCG CAPS Take one daily Patient not taking: Reported on 07/03/2020 11/27/19   [name], MD  famotidine (PEPCID) 20 MG tablet Take 1 tablet (20 mg total) by mouth daily. 05/11/20 07/10/20  Blain Pais, MD  folic acid (FOLVITE) 1 MG tablet Take 1 tablet by mouth once daily 01/16/20   [name], MD  lactulose [facility]) 10 GM/15ML solution Take  30 mLs (20 g total) by mouth 2 (two) times daily as needed for moderate constipation. y. 05/11/20   Blain Pais, MD  levothyroxine (SYNTHROID) 50 MCG tablet Take 1 tablet (50 mcg total) by mouth daily before breakfast. 07/02/20   [name], MD  mycophenolate (CELLCEPT) 250 MG capsule 6 capsules    [provider]  Nutritional Supplements (,FEEDING SUPPLEMENT, PROSOURCE PLUS) liquid Take 30 mLs by mouth 3 (three) times daily between meals. 05/11/20   Blain Pais, MD  Nutritional Supplements (FEEDING SUPPLEMENT,  NEPRO CARB STEADY,) LIQD Take 237 mLs by mouth daily. 05/11/20   Blain Pais, MD  vitamin B-12 (CYANOCOBALAMIN) 1000 MCG tablet Take 1,000 mcg by mouth daily. 04/09/20   [provider]  Vitamin D, Ergocalciferol, (DRISDOL) 1.25 MG (50000 UNIT) CAPS capsule Take 1 capsule (50,000 Units total) by mouth every 7 (seven) days. 07/03/20   [name], MD    Allergies    Patient has no known allergies.  Review of Systems   Review of Systems  Constitutional:       Per HPI, otherwise negative  HENT:       Per HPI, otherwise negative  Respiratory:       Per HPI, otherwise negative  Cardiovascular:       Per HPI, otherwise negative  Gastrointestinal: Negative for vomiting.  Endocrine:       Negative aside from HPI  Genitourinary:       Neg aside from HPI   Musculoskeletal:       Per HPI, otherwise negative  Skin: Positive for wound.  Allergic/Immunologic: Positive for immunocompromised state.  Neurological: Negative for syncope.    Physical Exam Updated Vital Signs BP (!) 206/108 (BP Location: Left Arm)   Pulse 72   Temp 97.6 F (36.4 C) (Oral)   Resp 17   Ht 6' (1.829 m)   Wt 72.5 kg   SpO2 99%   BMI 21.68 kg/m   Physical Exam Vitals and nursing note reviewed.  Constitutional:      General: He is not in acute distress.    Appearance: He is well-developed.  HENT:     Head: Normocephalic.   Eyes:     Conjunctiva/sclera: Conjunctivae normal.  Neck:   Cardiovascular:     Rate and Rhythm: Normal rate and regular rhythm.  Pulmonary:     Effort: Pulmonary effort is normal. No respiratory distress.     Breath sounds: No stridor.  Abdominal:     General: There is no distension.  Skin:    General: Skin is warm and dry.  Neurological:     Mental Status: He is alert and oriented to person, place, and time.     ED Results / Procedures / Treatments    Procedures Procedures   LACERATION REPAIR Performed by: Carmin Muskrat Authorized by:  Carmin Muskrat Consent: Verbal consent obtained. Risks and benefits: risks, benefits and alternatives were discussed Consent given by: patient Patient identity confirmed: provided demographic data Prepped and Draped in normal sterile fashion Wound explored  Laceration Location: face (nasal bridge, R forehead)  Laceration Length: 4.5 (total 2 + 2.5) cm  No Foreign Bodies seen or palpated   Irrigation method: syringe Amount of cleaning: standard  Skin closure: dermabond  Number of tubes - 1   Patient tolerance: Patient tolerated the procedure well with no immediate complications.  Medications Ordered in ED Medications  lidocaine (XYLOCAINE) 1 % (with pres) injection (has no administration in time  range)    ED Course  I have reviewed the triage vital signs and the nursing notes.  Pertinent labs & imaging results that were available during my care of the patient were reviewed by me and considered in my medical decision making (see chart for details).  Adult male with multiple medical issues including regular paracentesis presents after mechanical fall during which she sustained several facial lacerations. He is otherwise awake, alert, denies any pain or complaints.  Cervical spine cleared, and with no hemodynamic instability, no complaints of numbness, weakness, loss of strength anywhere, no indication for brain imaging. Patient had successful repair of several of his wounds, dressings applied to another, and was discharged in stable condition. Final Clinical Impression(s) / ED Diagnoses Final diagnoses:  Fall, initial encounter  Injury of head, initial encounter    Rx / DC Orders ED Discharge Orders    None       Carmin Muskrat, MD 07/15/20 1045

## 2020-07-16 DIAGNOSIS — Z992 Dependence on renal dialysis: Secondary | ICD-10-CM | POA: Diagnosis not present

## 2020-07-16 DIAGNOSIS — N186 End stage renal disease: Secondary | ICD-10-CM | POA: Diagnosis not present

## 2020-07-16 DIAGNOSIS — N2581 Secondary hyperparathyroidism of renal origin: Secondary | ICD-10-CM | POA: Diagnosis not present

## 2020-07-18 DIAGNOSIS — N2581 Secondary hyperparathyroidism of renal origin: Secondary | ICD-10-CM | POA: Diagnosis not present

## 2020-07-18 DIAGNOSIS — Z992 Dependence on renal dialysis: Secondary | ICD-10-CM | POA: Diagnosis not present

## 2020-07-18 DIAGNOSIS — N186 End stage renal disease: Secondary | ICD-10-CM | POA: Diagnosis not present

## 2020-07-20 DIAGNOSIS — N186 End stage renal disease: Secondary | ICD-10-CM | POA: Diagnosis not present

## 2020-07-20 DIAGNOSIS — Z992 Dependence on renal dialysis: Secondary | ICD-10-CM | POA: Diagnosis not present

## 2020-07-20 DIAGNOSIS — N2581 Secondary hyperparathyroidism of renal origin: Secondary | ICD-10-CM | POA: Diagnosis not present

## 2020-07-23 DIAGNOSIS — N186 End stage renal disease: Secondary | ICD-10-CM | POA: Diagnosis not present

## 2020-07-23 DIAGNOSIS — N2581 Secondary hyperparathyroidism of renal origin: Secondary | ICD-10-CM | POA: Diagnosis not present

## 2020-07-23 DIAGNOSIS — Z992 Dependence on renal dialysis: Secondary | ICD-10-CM | POA: Diagnosis not present

## 2020-07-25 DIAGNOSIS — M3214 Glomerular disease in systemic lupus erythematosus: Secondary | ICD-10-CM | POA: Diagnosis not present

## 2020-07-25 DIAGNOSIS — N2581 Secondary hyperparathyroidism of renal origin: Secondary | ICD-10-CM | POA: Diagnosis not present

## 2020-07-25 DIAGNOSIS — Z992 Dependence on renal dialysis: Secondary | ICD-10-CM | POA: Diagnosis not present

## 2020-07-25 DIAGNOSIS — N186 End stage renal disease: Secondary | ICD-10-CM | POA: Diagnosis not present

## 2020-07-27 DIAGNOSIS — N186 End stage renal disease: Secondary | ICD-10-CM | POA: Diagnosis not present

## 2020-07-27 DIAGNOSIS — Z992 Dependence on renal dialysis: Secondary | ICD-10-CM | POA: Diagnosis not present

## 2020-07-27 DIAGNOSIS — N2581 Secondary hyperparathyroidism of renal origin: Secondary | ICD-10-CM | POA: Diagnosis not present

## 2020-07-29 ENCOUNTER — Ambulatory Visit (HOSPITAL_COMMUNITY)
Admission: RE | Admit: 2020-07-29 | Discharge: 2020-07-29 | Disposition: A | Discharge status: Home / Self Care | Payer: BC Managed Care – PPO | Source: Ambulatory Visit | Attending: Nephrology | Admitting: Nephrology

## 2020-07-29 ENCOUNTER — Other Ambulatory Visit: Payer: Self-pay

## 2020-07-29 DIAGNOSIS — M1990 Unspecified osteoarthritis, unspecified site: Secondary | ICD-10-CM | POA: Diagnosis not present

## 2020-07-29 DIAGNOSIS — D72819 Decreased white blood cell count, unspecified: Secondary | ICD-10-CM | POA: Diagnosis not present

## 2020-07-29 DIAGNOSIS — D696 Thrombocytopenia, unspecified: Secondary | ICD-10-CM | POA: Diagnosis not present

## 2020-07-29 DIAGNOSIS — R188 Other ascites: Secondary | ICD-10-CM | POA: Insufficient documentation

## 2020-07-29 DIAGNOSIS — M329 Systemic lupus erythematosus, unspecified: Secondary | ICD-10-CM | POA: Diagnosis not present

## 2020-07-29 HISTORY — PX: IR PARACENTESIS: IMG2679

## 2020-07-29 MED ORDER — LIDOCAINE HCL (PF) 1 % IJ SOLN
INTRAMUSCULAR | Status: DC | PRN
  Administered 2020-07-29: 10 mL

## 2020-07-29 MED ORDER — LIDOCAINE HCL 1 % IJ SOLN
INTRAMUSCULAR | Status: AC
  Filled 2020-07-29: qty 20

## 2020-07-29 NOTE — Procedures (Signed)
PROCEDURE SUMMARY:  Successful image-guided paracentesis from the right lateral abdomen.  Yielded 2.0 liters of hazy yellow fluid.  No immediate complications.  EBL = 0 mL. Patient tolerated well.   Specimen was not sent for labs.  Please see imaging section of Epic for full dictation.   Claris Pong Jayton Popelka PA-C 07/29/2020 10:28 AM

## 2020-07-30 DIAGNOSIS — N2581 Secondary hyperparathyroidism of renal origin: Secondary | ICD-10-CM | POA: Diagnosis not present

## 2020-07-30 DIAGNOSIS — N186 End stage renal disease: Secondary | ICD-10-CM | POA: Diagnosis not present

## 2020-07-30 DIAGNOSIS — Z992 Dependence on renal dialysis: Secondary | ICD-10-CM | POA: Diagnosis not present

## 2020-08-01 DIAGNOSIS — N2581 Secondary hyperparathyroidism of renal origin: Secondary | ICD-10-CM | POA: Diagnosis not present

## 2020-08-01 DIAGNOSIS — N186 End stage renal disease: Secondary | ICD-10-CM | POA: Diagnosis not present

## 2020-08-01 DIAGNOSIS — Z992 Dependence on renal dialysis: Secondary | ICD-10-CM | POA: Diagnosis not present

## 2020-08-03 ENCOUNTER — Other Ambulatory Visit: Payer: Self-pay

## 2020-08-03 DIAGNOSIS — Z992 Dependence on renal dialysis: Secondary | ICD-10-CM | POA: Diagnosis not present

## 2020-08-03 DIAGNOSIS — N186 End stage renal disease: Secondary | ICD-10-CM | POA: Diagnosis not present

## 2020-08-03 DIAGNOSIS — N2581 Secondary hyperparathyroidism of renal origin: Secondary | ICD-10-CM | POA: Diagnosis not present

## 2020-08-06 DIAGNOSIS — N186 End stage renal disease: Secondary | ICD-10-CM | POA: Diagnosis not present

## 2020-08-06 DIAGNOSIS — N2581 Secondary hyperparathyroidism of renal origin: Secondary | ICD-10-CM | POA: Diagnosis not present

## 2020-08-06 DIAGNOSIS — Z992 Dependence on renal dialysis: Secondary | ICD-10-CM | POA: Diagnosis not present

## 2020-08-08 ENCOUNTER — Other Ambulatory Visit: Payer: Self-pay | Admitting: Nephrology

## 2020-08-08 DIAGNOSIS — N186 End stage renal disease: Secondary | ICD-10-CM | POA: Diagnosis not present

## 2020-08-08 DIAGNOSIS — Z992 Dependence on renal dialysis: Secondary | ICD-10-CM | POA: Diagnosis not present

## 2020-08-08 DIAGNOSIS — R188 Other ascites: Secondary | ICD-10-CM

## 2020-08-08 DIAGNOSIS — N2581 Secondary hyperparathyroidism of renal origin: Secondary | ICD-10-CM | POA: Diagnosis not present

## 2020-08-10 DIAGNOSIS — N186 End stage renal disease: Secondary | ICD-10-CM | POA: Diagnosis not present

## 2020-08-10 DIAGNOSIS — Z992 Dependence on renal dialysis: Secondary | ICD-10-CM | POA: Diagnosis not present

## 2020-08-10 DIAGNOSIS — N2581 Secondary hyperparathyroidism of renal origin: Secondary | ICD-10-CM | POA: Diagnosis not present

## 2020-08-12 DIAGNOSIS — Z992 Dependence on renal dialysis: Secondary | ICD-10-CM | POA: Diagnosis not present

## 2020-08-12 DIAGNOSIS — N186 End stage renal disease: Secondary | ICD-10-CM | POA: Diagnosis not present

## 2020-08-12 DIAGNOSIS — N2581 Secondary hyperparathyroidism of renal origin: Secondary | ICD-10-CM | POA: Diagnosis not present

## 2020-08-12 DIAGNOSIS — R110 Nausea: Secondary | ICD-10-CM | POA: Diagnosis not present

## 2020-08-13 DIAGNOSIS — R110 Nausea: Secondary | ICD-10-CM | POA: Diagnosis not present

## 2020-08-13 DIAGNOSIS — Z992 Dependence on renal dialysis: Secondary | ICD-10-CM | POA: Diagnosis not present

## 2020-08-13 DIAGNOSIS — N186 End stage renal disease: Secondary | ICD-10-CM | POA: Diagnosis not present

## 2020-08-13 DIAGNOSIS — N2581 Secondary hyperparathyroidism of renal origin: Secondary | ICD-10-CM | POA: Diagnosis not present

## 2020-08-14 ENCOUNTER — Other Ambulatory Visit: Payer: Self-pay

## 2020-08-14 ENCOUNTER — Ambulatory Visit (HOSPITAL_COMMUNITY)
Admission: RE | Admit: 2020-08-14 | Discharge: 2020-08-14 | Disposition: A | Discharge status: Home / Self Care | Payer: BC Managed Care – PPO | Source: Ambulatory Visit | Attending: Nephrology | Admitting: Nephrology

## 2020-08-14 DIAGNOSIS — R188 Other ascites: Secondary | ICD-10-CM | POA: Diagnosis not present

## 2020-08-14 HISTORY — PX: IR PARACENTESIS: IMG2679

## 2020-08-14 MED ORDER — LIDOCAINE HCL (PF) 1 % IJ SOLN
INTRAMUSCULAR | Status: DC | PRN
  Administered 2020-08-14: 10 mL

## 2020-08-14 MED ORDER — LIDOCAINE HCL (PF) 1 % IJ SOLN
INTRAMUSCULAR | Status: AC
  Filled 2020-08-14: qty 30

## 2020-08-14 NOTE — Procedures (Signed)
Ultrasound-guided  therapeutic paracentesis performed yielding 1.7 liters of turbid, cream colored fluid. No immediate complications. EBL none.

## 2020-08-15 DIAGNOSIS — N2581 Secondary hyperparathyroidism of renal origin: Secondary | ICD-10-CM | POA: Diagnosis not present

## 2020-08-15 DIAGNOSIS — N186 End stage renal disease: Secondary | ICD-10-CM | POA: Diagnosis not present

## 2020-08-15 DIAGNOSIS — R110 Nausea: Secondary | ICD-10-CM | POA: Diagnosis not present

## 2020-08-15 DIAGNOSIS — Z992 Dependence on renal dialysis: Secondary | ICD-10-CM | POA: Diagnosis not present

## 2020-08-17 DIAGNOSIS — Z992 Dependence on renal dialysis: Secondary | ICD-10-CM | POA: Diagnosis not present

## 2020-08-17 DIAGNOSIS — N2581 Secondary hyperparathyroidism of renal origin: Secondary | ICD-10-CM | POA: Diagnosis not present

## 2020-08-17 DIAGNOSIS — N186 End stage renal disease: Secondary | ICD-10-CM | POA: Diagnosis not present

## 2020-08-17 DIAGNOSIS — R110 Nausea: Secondary | ICD-10-CM | POA: Diagnosis not present

## 2020-08-20 DIAGNOSIS — N2581 Secondary hyperparathyroidism of renal origin: Secondary | ICD-10-CM | POA: Diagnosis not present

## 2020-08-20 DIAGNOSIS — N186 End stage renal disease: Secondary | ICD-10-CM | POA: Diagnosis not present

## 2020-08-20 DIAGNOSIS — Z992 Dependence on renal dialysis: Secondary | ICD-10-CM | POA: Diagnosis not present

## 2020-08-21 ENCOUNTER — Encounter: Payer: Self-pay | Admitting: Vascular Surgery

## 2020-08-21 ENCOUNTER — Ambulatory Visit (INDEPENDENT_AMBULATORY_CARE_PROVIDER_SITE_OTHER)
Admission: RE | Admit: 2020-08-21 | Discharge: 2020-08-21 | Disposition: A | Discharge status: Home / Self Care | Payer: BC Managed Care – PPO | Source: Ambulatory Visit | Attending: Vascular Surgery | Admitting: Vascular Surgery

## 2020-08-21 ENCOUNTER — Other Ambulatory Visit: Payer: Self-pay

## 2020-08-21 ENCOUNTER — Ambulatory Visit: Payer: BC Managed Care – PPO | Admitting: Vascular Surgery

## 2020-08-21 ENCOUNTER — Ambulatory Visit (HOSPITAL_COMMUNITY)
Admission: RE | Admit: 2020-08-21 | Discharge: 2020-08-21 | Disposition: A | Discharge status: Home / Self Care | Payer: BC Managed Care – PPO | Source: Ambulatory Visit | Attending: Vascular Surgery | Admitting: Vascular Surgery

## 2020-08-21 VITALS — BP 180/112 | HR 84 | Temp 98.6°F | Resp 20 | Ht 72.0 in | Wt 172.0 lb

## 2020-08-21 DIAGNOSIS — N186 End stage renal disease: Secondary | ICD-10-CM | POA: Diagnosis not present

## 2020-08-21 DIAGNOSIS — Z992 Dependence on renal dialysis: Secondary | ICD-10-CM

## 2020-08-21 NOTE — H&P (View-Only) (Signed)
REASON FOR CONSULT:    To evaluate for hemodialysis access.  The consult is requested by Dr. Vanetta Mulders.  ASSESSMENT & PLAN:   END-STAGE RENAL DISEASE: This patient presents for new access.  He currently has a functioning catheter.  He is right-handed so typically we would start on the left side.  His cephalic veins look marginal in size.  His basilic vein might be adequate for a basilic vein transposition.  If none of these veins are adequate then we would place an AV graft.  I have explained that I like to look at the veins myself before surgery but based on the current vein map it looks like he would require a basilic vein transposition which would potentially be done in 1 or 2 stages depending upon its size.  I have explained the indications for placement of an AV fistula or AV graft. I've explained that if at all possible we will place an AV fistula.  I have reviewed the risks of placement of an AV fistula including but not limited to: failure of the fistula to mature, need for subsequent interventions, and thrombosis. In addition I have reviewed the potential complications of placement of an AV graft. These risks include, but are not limited to, graft thrombosis, graft infection, wound healing problems, bleeding, arm swelling, and steal syndrome. All the patient's questions were answered and they are agreeable to proceed with surgery.  His surgery is scheduled for May 2 on a nondialysis day for him.  HYPERTENSION: The patient's initial blood pressure today was elevated. We repeated this and this was still elevated. We have encouraged the patient to follow up with their primary care physician for management of their blood pressure.  I did explain to him that if he came in for surgery and his blood pressure was significantly elevated anesthesia would likely cancel his surgery.   Deitra Mayo, MD Office: [phone]   HPI:   [name] is a pleasant 50 y.o. male, who is  referred for evaluation for hemodialysis access.  I have reviewed the records from the referring office.  The patient is right-handed.  The patient dialyzes on Tuesdays Thursdays and Saturdays and has a functioning tunneled dialysis catheter.  On my history, he has end-stage renal disease secondary to lupus.  His tunneled dialysis catheter was placed in mid January.  He dialyzes on Tuesdays Thursdays and Saturdays.  He has had some poor appetite and generalized weakness.  Has had no other uremic symptoms.  He does not have a pacemaker.  He is not on any blood thinners.  Past Medical History:  Diagnosis Date  . Chronic kidney disease   . Lupus ([place])   . Thyroid disease     Family History  Problem Relation Age of Onset  . Cancer Mother     SOCIAL HISTORY: Social History   Socioeconomic History  . Marital status: Single    Spouse name: Not on file  . Number of children: Not on file  . Years of education: Not on file  . Highest education level: Not on file  Occupational History  . Not on file  Tobacco Use  . Smoking status: Never Smoker  . Smokeless tobacco: Never Used  Vaping Use  . Vaping Use: Never used  Substance and Sexual Activity  . Alcohol use: Yes    Comment: 2-4 glasses of wine 2-3 times weekly  . Drug use: No  . Sexual activity: Not on file  Other Topics Concern  .  Not on file  Social History Narrative  . Not on file   Social Determinants of Health   Financial Resource Strain: Not on file  Food Insecurity: Not on file  Transportation Needs: Not on file  Physical Activity: Not on file  Stress: Not on file  Social Connections: Not on file  Intimate Partner Violence: Not on file    No Known Allergies  Current Outpatient Medications  Medication Sig Dispense Refill  . acetaminophen (TYLENOL) 325 MG tablet Take 2 tablets (650 mg total) by mouth every 4 (four) hours as needed for headache or mild pain.    . calcium acetate (PHOSLO) 667 MG capsule Take 1  capsule by mouth three times a day with meals take with START of meal    . Cyanocobalamin (B-12) 1000 MCG CAPS Take one daily 90 capsule 1  . folic acid (FOLVITE) 1 MG tablet Take 1 tablet by mouth once daily 90 tablet 0  . lactulose (CHRONULAC) 10 GM/15ML solution Take 30 mLs (20 g total) by mouth 2 (two) times daily as needed for moderate constipation. y. 236 mL 0  . levothyroxine (SYNTHROID) 50 MCG tablet Take 1 tablet (50 mcg total) by mouth daily before breakfast. 30 tablet 0  . mycophenolate (CELLCEPT) 250 MG capsule 6 capsules    . Nutritional Supplements (,FEEDING SUPPLEMENT, PROSOURCE PLUS) liquid Take 30 mLs by mouth 3 (three) times daily between meals. 887 mL 0  . Nutritional Supplements (FEEDING SUPPLEMENT, NEPRO CARB STEADY,) LIQD Take 237 mLs by mouth daily.  0  . vitamin B-12 (CYANOCOBALAMIN) 1000 MCG tablet Take 1,000 mcg by mouth daily.    . Vitamin D, Ergocalciferol, (DRISDOL) 1.25 MG (50000 UNIT) CAPS capsule Take 1 capsule (50,000 Units total) by mouth every 7 (seven) days. 5 capsule 5  . famotidine (PEPCID) 20 MG tablet Take 1 tablet (20 mg total) by mouth daily. 60 tablet 0   No current facility-administered medications for this visit.    REVIEW OF SYSTEMS:  '[X]'$  denotes positive finding, '[ ]'$  denotes negative finding Cardiac  Comments:  Chest pain or chest pressure:    Shortness of breath upon exertion:    Short of breath when lying flat:    Irregular heart rhythm:        Vascular    Pain in calf, thigh, or hip brought on by ambulation:    Pain in feet at night that wakes you up from your sleep:     Blood clot in your veins:    Leg swelling:         Pulmonary    Oxygen at home:    Productive cough:     Wheezing:         Neurologic    Sudden weakness in arms or legs:     Sudden numbness in arms or legs:     Sudden onset of difficulty speaking or slurred speech:    Temporary loss of vision in one eye:     Problems with dizziness:         Gastrointestinal     Blood in stool:     Vomited blood:         Genitourinary    Burning when urinating:     Blood in urine:        Psychiatric    Major depression:         Hematologic    Bleeding problems:    Problems with blood clotting too easily:  Skin    Rashes or ulcers:        Constitutional    Fever or chills:     PHYSICAL EXAM:   Vitals:   08/21/20 0838  BP: (!) 180/112  Pulse: 84  Resp: 20  Temp: 98.6 F (37 C)  SpO2: 98%  Weight: 172 lb (78 kg)  Height: 6' (1.829 m)    GENERAL: The patient is a well-nourished male, in no acute distress. The vital signs are documented above. CARDIAC: There is a regular rate and rhythm.  VASCULAR: I do not detect carotid bruits. He has palpable radial and brachial pulses bilaterally. PULMONARY: There is good air exchange bilaterally without wheezing or rales. ABDOMEN: Soft and non-tender with normal pitched bowel sounds.  MUSCULOSKELETAL: There are no major deformities or cyanosis. NEUROLOGIC: No focal weakness or paresthesias are detected. SKIN: There are no ulcers or rashes noted. PSYCHIATRIC: The patient has a normal affect.  DATA:    UPPER EXTREMITY VEIN MAP: I have independently interpreted the patient's upper extremity vein map.   On the right side the forearm and upper arm cephalic vein do not appear adequate for a fistula.  The basilic vein looks reasonable in size.  On the left side the forearm and upper arm cephalic vein do not appear adequate in size.  The basilic vein looks reasonable in size.   UPPER EXTREMITY ARTERIAL DUPLEX: I have independently interpreted his upper extremity arterial duplex.   On the right side there is a triphasic radial and ulnar signal.  The brachial artery measures 0.54 cm in maximum diameter.  On the left side there is a triphasic radial and ulnar waveform.  The brachial artery measures 0.51 cm in diameter.  LABS: I have reviewed the labs from 06/25/2020.  GFR was 32.  Creatinine 2.38.

## 2020-08-21 NOTE — Progress Notes (Signed)
REASON FOR CONSULT:    To evaluate for hemodialysis access.  The consult is requested by Dr. Vanetta Mulders.  ASSESSMENT & PLAN:   END-STAGE RENAL DISEASE: This patient presents for new access.  He currently has a functioning catheter.  He is right-handed so typically we would start on the left side.  His cephalic veins look marginal in size.  His basilic vein might be adequate for a basilic vein transposition.  If none of these veins are adequate then we would place an AV graft.  I have explained that I like to look at the veins myself before surgery but based on the current vein map it looks like he would require a basilic vein transposition which would potentially be done in 1 or 2 stages depending upon its size.  I have explained the indications for placement of an AV fistula or AV graft. I've explained that if at all possible we will place an AV fistula.  I have reviewed the risks of placement of an AV fistula including but not limited to: failure of the fistula to mature, need for subsequent interventions, and thrombosis. In addition I have reviewed the potential complications of placement of an AV graft. These risks include, but are not limited to, graft thrombosis, graft infection, wound healing problems, bleeding, arm swelling, and steal syndrome. All the patient's questions were answered and they are agreeable to proceed with surgery.  His surgery is scheduled for May 2 on a nondialysis day for him.  HYPERTENSION: The patient's initial blood pressure today was elevated. We repeated this and this was still elevated. We have encouraged the patient to follow up with their primary care physician for management of their blood pressure.  I did explain to him that if he came in for surgery and his blood pressure was significantly elevated anesthesia would likely cancel his surgery.   Deitra Mayo, MD Office: [phone]   HPI:   [name] is a pleasant 50 y.o. male, who is  referred for evaluation for hemodialysis access.  I have reviewed the records from the referring office.  The patient is right-handed.  The patient dialyzes on Tuesdays Thursdays and Saturdays and has a functioning tunneled dialysis catheter.  On my history, he has end-stage renal disease secondary to lupus.  His tunneled dialysis catheter was placed in mid January.  He dialyzes on Tuesdays Thursdays and Saturdays.  He has had some poor appetite and generalized weakness.  Has had no other uremic symptoms.  He does not have a pacemaker.  He is not on any blood thinners.  Past Medical History:  Diagnosis Date  . Chronic kidney disease   . Lupus ([place])   . Thyroid disease     Family History  Problem Relation Age of Onset  . Cancer Mother     SOCIAL HISTORY: Social History   Socioeconomic History  . Marital status: Single    Spouse name: Not on file  . Number of children: Not on file  . Years of education: Not on file  . Highest education level: Not on file  Occupational History  . Not on file  Tobacco Use  . Smoking status: Never Smoker  . Smokeless tobacco: Never Used  Vaping Use  . Vaping Use: Never used  Substance and Sexual Activity  . Alcohol use: Yes    Comment: 2-4 glasses of wine 2-3 times weekly  . Drug use: No  . Sexual activity: Not on file  Other Topics Concern  .  Not on file  Social History Narrative  . Not on file   Social Determinants of Health   Financial Resource Strain: Not on file  Food Insecurity: Not on file  Transportation Needs: Not on file  Physical Activity: Not on file  Stress: Not on file  Social Connections: Not on file  Intimate Partner Violence: Not on file    No Known Allergies  Current Outpatient Medications  Medication Sig Dispense Refill  . acetaminophen (TYLENOL) 325 MG tablet Take 2 tablets (650 mg total) by mouth every 4 (four) hours as needed for headache or mild pain.    . calcium acetate (PHOSLO) 667 MG capsule Take 1  capsule by mouth three times a day with meals take with START of meal    . Cyanocobalamin (B-12) 1000 MCG CAPS Take one daily 90 capsule 1  . folic acid (FOLVITE) 1 MG tablet Take 1 tablet by mouth once daily 90 tablet 0  . lactulose (CHRONULAC) 10 GM/15ML solution Take 30 mLs (20 g total) by mouth 2 (two) times daily as needed for moderate constipation. y. 236 mL 0  . levothyroxine (SYNTHROID) 50 MCG tablet Take 1 tablet (50 mcg total) by mouth daily before breakfast. 30 tablet 0  . mycophenolate (CELLCEPT) 250 MG capsule 6 capsules    . Nutritional Supplements (,FEEDING SUPPLEMENT, PROSOURCE PLUS) liquid Take 30 mLs by mouth 3 (three) times daily between meals. 887 mL 0  . Nutritional Supplements (FEEDING SUPPLEMENT, NEPRO CARB STEADY,) LIQD Take 237 mLs by mouth daily.  0  . vitamin B-12 (CYANOCOBALAMIN) 1000 MCG tablet Take 1,000 mcg by mouth daily.    . Vitamin D, Ergocalciferol, (DRISDOL) 1.25 MG (50000 UNIT) CAPS capsule Take 1 capsule (50,000 Units total) by mouth every 7 (seven) days. 5 capsule 5  . famotidine (PEPCID) 20 MG tablet Take 1 tablet (20 mg total) by mouth daily. 60 tablet 0   No current facility-administered medications for this visit.    REVIEW OF SYSTEMS:  '[X]'$  denotes positive finding, '[ ]'$  denotes negative finding Cardiac  Comments:  Chest pain or chest pressure:    Shortness of breath upon exertion:    Short of breath when lying flat:    Irregular heart rhythm:        Vascular    Pain in calf, thigh, or hip brought on by ambulation:    Pain in feet at night that wakes you up from your sleep:     Blood clot in your veins:    Leg swelling:         Pulmonary    Oxygen at home:    Productive cough:     Wheezing:         Neurologic    Sudden weakness in arms or legs:     Sudden numbness in arms or legs:     Sudden onset of difficulty speaking or slurred speech:    Temporary loss of vision in one eye:     Problems with dizziness:         Gastrointestinal     Blood in stool:     Vomited blood:         Genitourinary    Burning when urinating:     Blood in urine:        Psychiatric    Major depression:         Hematologic    Bleeding problems:    Problems with blood clotting too easily:  Skin    Rashes or ulcers:        Constitutional    Fever or chills:     PHYSICAL EXAM:   Vitals:   08/21/20 0838  BP: (!) 180/112  Pulse: 84  Resp: 20  Temp: 98.6 F (37 C)  SpO2: 98%  Weight: 172 lb (78 kg)  Height: 6' (1.829 m)    GENERAL: The patient is a well-nourished male, in no acute distress. The vital signs are documented above. CARDIAC: There is a regular rate and rhythm.  VASCULAR: I do not detect carotid bruits. He has palpable radial and brachial pulses bilaterally. PULMONARY: There is good air exchange bilaterally without wheezing or rales. ABDOMEN: Soft and non-tender with normal pitched bowel sounds.  MUSCULOSKELETAL: There are no major deformities or cyanosis. NEUROLOGIC: No focal weakness or paresthesias are detected. SKIN: There are no ulcers or rashes noted. PSYCHIATRIC: The patient has a normal affect.  DATA:    UPPER EXTREMITY VEIN MAP: I have independently interpreted the patient's upper extremity vein map.   On the right side the forearm and upper arm cephalic vein do not appear adequate for a fistula.  The basilic vein looks reasonable in size.  On the left side the forearm and upper arm cephalic vein do not appear adequate in size.  The basilic vein looks reasonable in size.   UPPER EXTREMITY ARTERIAL DUPLEX: I have independently interpreted his upper extremity arterial duplex.   On the right side there is a triphasic radial and ulnar signal.  The brachial artery measures 0.54 cm in maximum diameter.  On the left side there is a triphasic radial and ulnar waveform.  The brachial artery measures 0.51 cm in diameter.  LABS: I have reviewed the labs from 06/25/2020.  GFR was 32.  Creatinine 2.38.

## 2020-08-22 DIAGNOSIS — Z992 Dependence on renal dialysis: Secondary | ICD-10-CM | POA: Diagnosis not present

## 2020-08-22 DIAGNOSIS — N186 End stage renal disease: Secondary | ICD-10-CM | POA: Diagnosis not present

## 2020-08-22 DIAGNOSIS — N2581 Secondary hyperparathyroidism of renal origin: Secondary | ICD-10-CM | POA: Diagnosis not present

## 2020-08-23 ENCOUNTER — Encounter (HOSPITAL_COMMUNITY): Payer: Self-pay | Admitting: Vascular Surgery

## 2020-08-23 ENCOUNTER — Other Ambulatory Visit (HOSPITAL_COMMUNITY)
Admission: RE | Admit: 2020-08-23 | Discharge: 2020-08-23 | Disposition: A | Discharge status: Home / Self Care | Payer: BC Managed Care – PPO | Source: Ambulatory Visit | Attending: Vascular Surgery | Admitting: Vascular Surgery

## 2020-08-23 DIAGNOSIS — Z79899 Other long term (current) drug therapy: Secondary | ICD-10-CM | POA: Diagnosis not present

## 2020-08-23 DIAGNOSIS — M3214 Glomerular disease in systemic lupus erythematosus: Secondary | ICD-10-CM | POA: Diagnosis not present

## 2020-08-23 DIAGNOSIS — Z79890 Hormone replacement therapy (postmenopausal): Secondary | ICD-10-CM | POA: Diagnosis not present

## 2020-08-23 DIAGNOSIS — Z01812 Encounter for preprocedural laboratory examination: Secondary | ICD-10-CM | POA: Insufficient documentation

## 2020-08-23 DIAGNOSIS — Z992 Dependence on renal dialysis: Secondary | ICD-10-CM | POA: Diagnosis not present

## 2020-08-23 DIAGNOSIS — Z20822 Contact with and (suspected) exposure to covid-19: Secondary | ICD-10-CM | POA: Insufficient documentation

## 2020-08-23 DIAGNOSIS — N186 End stage renal disease: Secondary | ICD-10-CM | POA: Diagnosis not present

## 2020-08-23 DIAGNOSIS — I120 Hypertensive chronic kidney disease with stage 5 chronic kidney disease or end stage renal disease: Secondary | ICD-10-CM | POA: Diagnosis not present

## 2020-08-23 NOTE — Progress Notes (Signed)
EKG: 06/26/20 CXR: na ECHO: 12/14/19 Stress Test: denies Cardiac Cath: denies   Fasting Blood Sugar- na Checks Blood Sugar_na__ times a day  OSA/CPAP: No  ASA/Blood Thinners: No  Covid test 08/23/20  Anesthesia Review: No  Patient denies shortness of breath, fever, cough, and chest pain at PAT appointment.  Patient verbalized understanding of instructions provided today at the PAT appointment.  Patient asked to review instructions at home and day of surgery.

## 2020-08-24 DIAGNOSIS — M3214 Glomerular disease in systemic lupus erythematosus: Secondary | ICD-10-CM | POA: Diagnosis not present

## 2020-08-24 DIAGNOSIS — Z992 Dependence on renal dialysis: Secondary | ICD-10-CM | POA: Diagnosis not present

## 2020-08-24 DIAGNOSIS — N2581 Secondary hyperparathyroidism of renal origin: Secondary | ICD-10-CM | POA: Diagnosis not present

## 2020-08-24 DIAGNOSIS — N186 End stage renal disease: Secondary | ICD-10-CM | POA: Diagnosis not present

## 2020-08-24 LAB — SARS CORONAVIRUS 2 (TAT 6-24 HRS): SARS Coronavirus 2: NEGATIVE

## 2020-08-25 NOTE — Anesthesia Preprocedure Evaluation (Addendum)
Anesthesia Evaluation  Patient identified by MRN, date of birth, ID band Patient awake    Reviewed: Allergy & Precautions, NPO status , Patient's Chart, lab work & pertinent test results  History of Anesthesia Complications Negative for: history of anesthetic complications  Airway Mallampati: II  TM Distance: >3 FB Neck ROM: Full    Dental  (+) Dental Advisory Given   Pulmonary  08/23/2020 SARS coronavirus NEG   breath sounds clear to auscultation       Cardiovascular  Rhythm:Regular Rate:Normal  04/2020 ECHO: EF 55-60%, mod LVH, no significant valvular disease  H/o SLE pericarditis   Neuro/Psych negative neurological ROS     GI/Hepatic negative GI ROS, Ascites from SLE   Endo/Other  Hypothyroidism SLE  Renal/GU ESRFRenal disease (K+ 3.6)     Musculoskeletal   Abdominal ascites  Peds  Hematology  (+) Blood dyscrasia (Hb 8.5), anemia ,   Anesthesia Other Findings   Reproductive/Obstetrics                            Anesthesia Physical Anesthesia Plan  ASA: III  Anesthesia Plan: MAC and Regional   Post-op Pain Management:    Induction:   PONV Risk Score and Plan: 1 and Ondansetron  Airway Management Planned: Natural Airway and Simple Face Mask  Additional Equipment: None  Intra-op Plan:   Post-operative Plan:   Informed Consent: I have reviewed the patients History and Physical, chart, labs and discussed the procedure including the risks, benefits and alternatives for the proposed anesthesia with the patient or authorized representative who has indicated his/her understanding and acceptance.     Dental advisory given  Plan Discussed with: CRNA and Surgeon  Anesthesia Plan Comments: (Plan routine monitors, supraclavicular block with sedation)       Anesthesia Quick Evaluation

## 2020-08-26 ENCOUNTER — Ambulatory Visit (HOSPITAL_COMMUNITY)
Admission: RE | Admit: 2020-08-26 | Discharge: 2020-08-26 | Disposition: A | Discharge status: Home / Self Care | Payer: BC Managed Care – PPO | Attending: Vascular Surgery | Admitting: Vascular Surgery

## 2020-08-26 ENCOUNTER — Ambulatory Visit (HOSPITAL_COMMUNITY): Payer: BC Managed Care – PPO | Admitting: Anesthesiology

## 2020-08-26 ENCOUNTER — Encounter (HOSPITAL_COMMUNITY)
Admission: RE | Disposition: A | Discharge status: Home / Self Care | Payer: Self-pay | Source: Home / Self Care | Attending: Vascular Surgery

## 2020-08-26 ENCOUNTER — Other Ambulatory Visit: Payer: Self-pay

## 2020-08-26 ENCOUNTER — Encounter (HOSPITAL_COMMUNITY): Payer: Self-pay | Admitting: Vascular Surgery

## 2020-08-26 DIAGNOSIS — Z992 Dependence on renal dialysis: Secondary | ICD-10-CM | POA: Insufficient documentation

## 2020-08-26 DIAGNOSIS — Z79890 Hormone replacement therapy (postmenopausal): Secondary | ICD-10-CM | POA: Insufficient documentation

## 2020-08-26 DIAGNOSIS — N186 End stage renal disease: Secondary | ICD-10-CM | POA: Insufficient documentation

## 2020-08-26 DIAGNOSIS — E559 Vitamin D deficiency, unspecified: Secondary | ICD-10-CM | POA: Diagnosis not present

## 2020-08-26 DIAGNOSIS — Z20822 Contact with and (suspected) exposure to covid-19: Secondary | ICD-10-CM | POA: Insufficient documentation

## 2020-08-26 DIAGNOSIS — I120 Hypertensive chronic kidney disease with stage 5 chronic kidney disease or end stage renal disease: Secondary | ICD-10-CM | POA: Diagnosis not present

## 2020-08-26 DIAGNOSIS — Z79899 Other long term (current) drug therapy: Secondary | ICD-10-CM | POA: Insufficient documentation

## 2020-08-26 DIAGNOSIS — M3214 Glomerular disease in systemic lupus erythematosus: Secondary | ICD-10-CM | POA: Diagnosis not present

## 2020-08-26 DIAGNOSIS — E039 Hypothyroidism, unspecified: Secondary | ICD-10-CM | POA: Diagnosis not present

## 2020-08-26 DIAGNOSIS — D631 Anemia in chronic kidney disease: Secondary | ICD-10-CM | POA: Diagnosis not present

## 2020-08-26 DIAGNOSIS — N185 Chronic kidney disease, stage 5: Secondary | ICD-10-CM | POA: Diagnosis not present

## 2020-08-26 HISTORY — PX: AV FISTULA PLACEMENT: SHX1204

## 2020-08-26 LAB — POCT I-STAT, CHEM 8
BUN: 35 mg/dL — ABNORMAL HIGH (ref 6–20)
Calcium, Ion: 1.05 mmol/L — ABNORMAL LOW (ref 1.15–1.40)
Chloride: 96 mmol/L — ABNORMAL LOW (ref 98–111)
Creatinine, Ser: 3.3 mg/dL — ABNORMAL HIGH (ref 0.61–1.24)
Glucose, Bld: 73 mg/dL (ref 70–99)
HCT: 25 % — ABNORMAL LOW (ref 39.0–52.0)
Hemoglobin: 8.5 g/dL — ABNORMAL LOW (ref 13.0–17.0)
Potassium: 3.6 mmol/L (ref 3.5–5.1)
Sodium: 134 mmol/L — ABNORMAL LOW (ref 135–145)
TCO2: 27 mmol/L (ref 22–32)

## 2020-08-26 SURGERY — ARTERIOVENOUS (AV) FISTULA CREATION
Anesthesia: Monitor Anesthesia Care | Site: Arm Lower | Laterality: Left

## 2020-08-26 MED ORDER — SODIUM CHLORIDE (PF) 0.9 % IJ SOLN
INTRAMUSCULAR | Status: DC | PRN
  Administered 2020-08-26: 60 mL via TOPICAL

## 2020-08-26 MED ORDER — DEXAMETHASONE SODIUM PHOSPHATE 10 MG/ML IJ SOLN
INTRAMUSCULAR | Status: AC
  Filled 2020-08-26: qty 1

## 2020-08-26 MED ORDER — SODIUM CHLORIDE 0.9 % IV SOLN
INTRAVENOUS | Status: AC
  Filled 2020-08-26: qty 1.2

## 2020-08-26 MED ORDER — PAPAVERINE HCL 30 MG/ML IJ SOLN
INTRAMUSCULAR | Status: AC
  Filled 2020-08-26: qty 2

## 2020-08-26 MED ORDER — LABETALOL HCL 5 MG/ML IV SOLN
INTRAVENOUS | Status: AC
  Administered 2020-08-26: 5 mg via INTRAVENOUS
  Filled 2020-08-26: qty 4

## 2020-08-26 MED ORDER — PROPOFOL 10 MG/ML IV BOLUS
INTRAVENOUS | Status: DC | PRN
  Administered 2020-08-26: 30 mg via INTRAVENOUS

## 2020-08-26 MED ORDER — SODIUM CHLORIDE 0.9 % IV SOLN
INTRAVENOUS | Status: DC | PRN
  Administered 2020-08-26: 500 mL

## 2020-08-26 MED ORDER — HYDROMORPHONE HCL 1 MG/ML IJ SOLN: 0.2500 mg | INTRAMUSCULAR | Status: DC | PRN

## 2020-08-26 MED ORDER — MIDAZOLAM HCL 2 MG/2ML IJ SOLN
INTRAMUSCULAR | Status: DC | PRN
  Administered 2020-08-26 (×2): 1 mg via INTRAVENOUS

## 2020-08-26 MED ORDER — OXYCODONE HCL 5 MG/5ML PO SOLN: 5.0000 mg | Freq: Once | ORAL | Status: DC | PRN

## 2020-08-26 MED ORDER — PROTAMINE SULFATE 10 MG/ML IV SOLN
INTRAVENOUS | Status: DC | PRN
  Administered 2020-08-26: 40 mg via INTRAVENOUS

## 2020-08-26 MED ORDER — FENTANYL CITRATE (PF) 250 MCG/5ML IJ SOLN
INTRAMUSCULAR | Status: DC | PRN
  Administered 2020-08-26 (×5): 50 ug via INTRAVENOUS

## 2020-08-26 MED ORDER — 0.9 % SODIUM CHLORIDE (POUR BTL) OPTIME
TOPICAL | Status: DC | PRN
  Administered 2020-08-26: 1000 mL

## 2020-08-26 MED ORDER — FENTANYL CITRATE (PF) 250 MCG/5ML IJ SOLN
INTRAMUSCULAR | Status: AC
  Filled 2020-08-26: qty 5

## 2020-08-26 MED ORDER — CHLORHEXIDINE GLUCONATE 0.12 % MT SOLN
OROMUCOSAL | Status: AC
  Administered 2020-08-26: 15 mL via OROMUCOSAL
  Filled 2020-08-26: qty 15

## 2020-08-26 MED ORDER — OXYCODONE-ACETAMINOPHEN 5-325 MG PO TABS: 1.0000 | ORAL_TABLET | Freq: Four times a day (QID) | ORAL | 0 refills | Status: DC | PRN

## 2020-08-26 MED ORDER — LIDOCAINE HCL (PF) 1 % IJ SOLN
INTRAMUSCULAR | Status: DC | PRN
  Administered 2020-08-26: 30 mL

## 2020-08-26 MED ORDER — OXYCODONE HCL 5 MG PO TABS
5.0000 mg | ORAL_TABLET | Freq: Once | ORAL | Status: DC | PRN
Start: 2020-08-26 — End: 2020-08-26

## 2020-08-26 MED ORDER — HEPARIN SODIUM (PORCINE) 1000 UNIT/ML IJ SOLN
INTRAMUSCULAR | Status: DC | PRN
  Administered 2020-08-26: 7000 [IU] via INTRAVENOUS

## 2020-08-26 MED ORDER — MIDAZOLAM HCL 2 MG/2ML IJ SOLN
0.5000 mg | Freq: Once | INTRAMUSCULAR | Status: DC | PRN
Start: 2020-08-26 — End: 2020-08-26

## 2020-08-26 MED ORDER — ONDANSETRON HCL 4 MG/2ML IJ SOLN
INTRAMUSCULAR | Status: AC
  Filled 2020-08-26: qty 2

## 2020-08-26 MED ORDER — LIDOCAINE HCL (PF) 1 % IJ SOLN
INTRAMUSCULAR | Status: AC
  Filled 2020-08-26: qty 30

## 2020-08-26 MED ORDER — LABETALOL HCL 5 MG/ML IV SOLN: 5.0000 mg | INTRAVENOUS | Status: DC | PRN

## 2020-08-26 MED ORDER — CEFAZOLIN SODIUM-DEXTROSE 2-4 GM/100ML-% IV SOLN
INTRAVENOUS | Status: AC
  Filled 2020-08-26: qty 100

## 2020-08-26 MED ORDER — CHLORHEXIDINE GLUCONATE 0.12 % MT SOLN
15.0000 mL | OROMUCOSAL | Status: AC
  Filled 2020-08-26: qty 15

## 2020-08-26 MED ORDER — SODIUM CHLORIDE 0.9 % IV SOLN: INTRAVENOUS | Status: DC

## 2020-08-26 MED ORDER — DEXAMETHASONE SODIUM PHOSPHATE 10 MG/ML IJ SOLN
INTRAMUSCULAR | Status: DC | PRN
  Administered 2020-08-26: 5 mg via INTRAVENOUS

## 2020-08-26 MED ORDER — LIDOCAINE-EPINEPHRINE 1 %-1:100000 IJ SOLN
INTRAMUSCULAR | Status: AC
  Filled 2020-08-26: qty 1

## 2020-08-26 MED ORDER — PROMETHAZINE HCL 25 MG/ML IJ SOLN: 6.2500 mg | INTRAMUSCULAR | Status: DC | PRN

## 2020-08-26 MED ORDER — HEPARIN SODIUM (PORCINE) 1000 UNIT/ML IJ SOLN
INTRAMUSCULAR | Status: AC
  Filled 2020-08-26: qty 1

## 2020-08-26 MED ORDER — LABETALOL HCL 5 MG/ML IV SOLN
5.0000 mg | INTRAVENOUS | Status: DC | PRN
  Administered 2020-08-26: 5 mg via INTRAVENOUS

## 2020-08-26 MED ORDER — CHLORHEXIDINE GLUCONATE 4 % EX LIQD: 60.0000 mL | Freq: Once | CUTANEOUS | Status: DC

## 2020-08-26 MED ORDER — PROPOFOL 500 MG/50ML IV EMUL
INTRAVENOUS | Status: DC | PRN
  Administered 2020-08-26: 75 ug/kg/min via INTRAVENOUS

## 2020-08-26 MED ORDER — MEPERIDINE HCL 25 MG/ML IJ SOLN: 6.2500 mg | INTRAMUSCULAR | Status: DC | PRN

## 2020-08-26 MED ORDER — LIDOCAINE HCL (CARDIAC) PF 100 MG/5ML IV SOSY
PREFILLED_SYRINGE | INTRAVENOUS | Status: DC | PRN
  Administered 2020-08-26: 100 mg via INTRATRACHEAL

## 2020-08-26 MED ORDER — LIDOCAINE-EPINEPHRINE (PF) 1.5 %-1:200000 IJ SOLN
INTRAMUSCULAR | Status: DC | PRN
  Administered 2020-08-26: 20 mL via PERINEURAL

## 2020-08-26 MED ORDER — PROTAMINE SULFATE 10 MG/ML IV SOLN
INTRAVENOUS | Status: AC
  Filled 2020-08-26: qty 5

## 2020-08-26 MED ORDER — MIDAZOLAM HCL 2 MG/2ML IJ SOLN
INTRAMUSCULAR | Status: AC
  Filled 2020-08-26: qty 2

## 2020-08-26 MED ORDER — ONDANSETRON HCL 4 MG/2ML IJ SOLN
INTRAMUSCULAR | Status: DC | PRN
  Administered 2020-08-26: 4 mg via INTRAVENOUS

## 2020-08-26 MED ORDER — CEFAZOLIN SODIUM-DEXTROSE 2-4 GM/100ML-% IV SOLN
2.0000 g | INTRAVENOUS | Status: AC
  Administered 2020-08-26: 2 g via INTRAVENOUS

## 2020-08-26 SURGICAL SUPPLY — 33 items
ADH SKN CLS APL DERMABOND .7 (GAUZE/BANDAGES/DRESSINGS) ×1
ARMBAND PINK RESTRICT EXTREMIT (MISCELLANEOUS) ×4 IMPLANT
CANISTER SUCT 3000ML PPV (MISCELLANEOUS) ×2 IMPLANT
CANNULA VESSEL 3MM 2 BLNT TIP (CANNULA) ×2 IMPLANT
CLIP VESOCCLUDE MED 6/CT (CLIP) ×2 IMPLANT
CLIP VESOCCLUDE SM WIDE 6/CT (CLIP) ×2 IMPLANT
COVER PROBE W GEL 5X96 (DRAPES) ×2 IMPLANT
COVER WAND RF STERILE (DRAPES) IMPLANT
DECANTER SPIKE VIAL GLASS SM (MISCELLANEOUS) ×2 IMPLANT
DERMABOND ADVANCED (GAUZE/BANDAGES/DRESSINGS) ×1
DERMABOND ADVANCED .7 DNX12 (GAUZE/BANDAGES/DRESSINGS) ×1 IMPLANT
ELECT REM PT RETURN 9FT ADLT (ELECTROSURGICAL) ×2
ELECTRODE REM PT RTRN 9FT ADLT (ELECTROSURGICAL) ×1 IMPLANT
GLOVE BIO SURGEON STRL SZ7.5 (GLOVE) ×2 IMPLANT
GLOVE SRG 8 PF TXTR STRL LF DI (GLOVE) ×1 IMPLANT
GLOVE SURG UNDER POLY LF SZ8 (GLOVE) ×2
GOWN STRL REUS W/ TWL LRG LVL3 (GOWN DISPOSABLE) ×3 IMPLANT
GOWN STRL REUS W/TWL LRG LVL3 (GOWN DISPOSABLE) ×6
KIT BASIN OR (CUSTOM PROCEDURE TRAY) ×2 IMPLANT
KIT TURNOVER KIT B (KITS) ×2 IMPLANT
NS IRRIG 1000ML POUR BTL (IV SOLUTION) ×2 IMPLANT
PACK CV ACCESS (CUSTOM PROCEDURE TRAY) ×2 IMPLANT
PAD ARMBOARD 7.5X6 YLW CONV (MISCELLANEOUS) ×2 IMPLANT
SPONGE SURGIFOAM ABS GEL 100 (HEMOSTASIS) IMPLANT
SUT MNCRL AB 4-0 PS2 18 (SUTURE) ×2 IMPLANT
SUT PROLENE 6 0 BV (SUTURE) ×8 IMPLANT
SUT SILK 3 0 (SUTURE) ×2
SUT SILK 3-0 18XBRD TIE 12 (SUTURE) ×1 IMPLANT
SUT VIC AB 3-0 SH 27 (SUTURE) ×2
SUT VIC AB 3-0 SH 27X BRD (SUTURE) ×1 IMPLANT
TOWEL GREEN STERILE (TOWEL DISPOSABLE) ×2 IMPLANT
UNDERPAD 30X36 HEAVY ABSORB (UNDERPADS AND DIAPERS) ×2 IMPLANT
WATER STERILE IRR 1000ML POUR (IV SOLUTION) ×2 IMPLANT

## 2020-08-26 NOTE — Discharge Instructions (Addendum)
   [facility]  Discharge Instructions  AV Fistula or Graft Surgery for Dialysis Access  Please refer to the following instructions for your post-procedure care. Your surgeon or physician assistant will discuss any changes with you.  Activity  You may drive the day following your surgery, if you are comfortable and no longer taking prescription pain medication. Resume full activity as the soreness in your incision resolves.  Bathing/Showering  You may shower after you go home. Keep your incision dry for 48 hours. Do not soak in a bathtub, hot tub, or swim until the incision heals completely. You may not shower if you have a hemodialysis catheter.  Incision Care  Clean your incision with mild soap and water after 48 hours. Pat the area dry with a clean towel. You do not need a bandage unless otherwise instructed. Do not apply any ointments or creams to your incision. You may have skin glue on your incision. Do not peel it off. It will come off on its own in about one week. Your arm may swell a bit after surgery. To reduce swelling use pillows to elevate your arm so it is above your heart. Your doctor will tell you if you need to lightly wrap your arm with an ACE bandage.  Diet  Resume your normal diet. There are not special food restrictions following this procedure. In order to heal from your surgery, it is CRITICAL to get adequate nutrition. Your body requires vitamins, minerals, and protein. Vegetables are the best source of vitamins and minerals. Vegetables also provide the perfect balance of protein. Processed food has little nutritional value, so try to avoid this.  Medications  Resume taking all of your medications. If your incision is causing pain, you may take over-the counter pain relievers such as acetaminophen (Tylenol). If you were prescribed a stronger pain medication, please be aware these medications can cause nausea and constipation. Prevent  nausea by taking the medication with a snack or meal. Avoid constipation by drinking plenty of fluids and eating foods with high amount of fiber, such as fruits, vegetables, and grains.  Do not take Tylenol if you are taking prescription pain medications.  Follow up Your surgeon may want to see you in the office following your access surgery. If so, this will be arranged at the time of your surgery.  Please call us immediately for any of the following conditions:  . Increased pain, redness, drainage (pus) from your incision site . Fever of 101 degrees or higher . Severe or worsening pain at your incision site . Hand pain or numbness. .  Reduce your risk of vascular disease:  . Stop smoking. If you would like help, call [facility] at 1-800-QUIT-NOW 417-031-8950) or [place] at ([phone]  . Manage your cholesterol . Maintain a desired weight . Control your diabetes . Keep your blood pressure down  Dialysis  It will take several weeks to several months for your new dialysis access to be ready for use. Your surgeon will determine when it is okay to use it. Your nephrologist will continue to direct your dialysis. You can continue to use your Permcath until your new access is ready for use.   08/26/2020 QUADARIUS LIPUMA [id number] 1970/09/11  Surgeon(s): Angelia Mould, MD  Procedure(s): LEFT RADIOCEPHALIC ARTERIOVENOUS (AV) FISTULA CREATION  x Do not stick fistula for 12 weeks    If you have any questions, please call the office at [phone].

## 2020-08-26 NOTE — Progress Notes (Signed)
Orthopedic Tech Progress Note Patient Details:  [name] March 14, 1971 [id number] PACU RN called requesting an ARM SLING for patient  Ortho Devices Type of Ortho Device: Arm sling Ortho Device/Splint Interventions: Ordered,Adjustment   Post Interventions Patient Tolerated: Well Instructions Provided: Care of [place] 08/26/2020, 11:08 AM

## 2020-08-26 NOTE — Anesthesia Postprocedure Evaluation (Signed)
Anesthesia Post Note  Patient: YOUSUF AGER  Procedure(s) Performed: LEFT RADIOCEPHALIC ARTERIOVENOUS (AV) FISTULA CREATION (Left Arm Lower)     Patient location during evaluation: PACU Anesthesia Type: MAC and Regional Level of consciousness: awake and alert, patient cooperative and oriented Pain management: pain level controlled Vital Signs Assessment: post-procedure vital signs reviewed and stable Respiratory status: spontaneous breathing, nonlabored ventilation and respiratory function stable Cardiovascular status: blood pressure returned to baseline (controlled with labetolol) Postop Assessment: no apparent nausea or vomiting and able to ambulate Anesthetic complications: no Comments: Pt understands to discuss his BP with his gen MD   No complications documented.  Last Vitals:  Vitals:   08/26/20 0945 08/26/20 1015  BP: (!) 171/112 (!) 173/95  Pulse: 82 66  Resp: 17 12  Temp:  36.4 C  SpO2: 96% 94%    Last Pain:  Vitals:   08/26/20 1015  TempSrc:   PainSc: 0-No pain                 Abyan Cadman,E. Janie Strothman

## 2020-08-26 NOTE — Transfer of Care (Signed)
Immediate Anesthesia Transfer of Care Note  Patient: [name]  Procedure(s) Performed: LEFT RADIOCEPHALIC ARTERIOVENOUS (AV) FISTULA CREATION (Left Arm Lower)  Patient Location: PACU  Anesthesia Type:General  Level of Consciousness: awake, alert , oriented and patient cooperative  Airway & Oxygen Therapy: Patient Spontanous Breathing  Post-op Assessment: Report given to RN and Post -op Vital signs reviewed and stable  Post vital signs: Reviewed and stable  Last Vitals:  Vitals Value Taken Time  BP 249/104 08/26/20 0929  Temp    Pulse 93 08/26/20 0931  Resp 16 08/26/20 0931  SpO2 95 % 08/26/20 0931  Vitals shown include unvalidated device data.  Last Pain:  Vitals:   08/26/20 0625  TempSrc: Oral  PainSc:       Patients Stated Pain Goal: 0 ([id number])  Complications: No complications documented.

## 2020-08-26 NOTE — Anesthesia Procedure Notes (Signed)
Procedure Name: MAC Date/Time: 08/26/2020 7:41 AM Performed by: Kathryne Hitch, CRNA Pre-anesthesia Checklist: Emergency Drugs available, Patient identified, Patient being monitored and Suction available Patient Re-evaluated:Patient Re-evaluated prior to induction Oxygen Delivery Method: Simple face mask Preoxygenation: Pre-oxygenation with 100% oxygen Induction Type: IV induction Placement Confirmation: positive ETCO2 Dental Injury: Teeth and Oropharynx as per pre-operative assessment

## 2020-08-26 NOTE — Op Note (Signed)
    NAME: [name]    MRN: [id number] DOB: [birth year]    DATE OF OPERATION: 08/26/2020  PREOP DIAGNOSIS:    End-stage renal disease  POSTOP DIAGNOSIS:    Same  PROCEDURE:    Left radiocephalic AV fistula  SURGEON: Judeth Cornfield. Scot Dock, MD  ASSIST: Maida Sale, RNFA  ANESTHESIA: Axillary block  EBL: Minimal  INDICATIONS:    [name] is a 50 y.o. male who presents for new access.  He has a functioning right IJ tunneled dialysis catheter.  He dialyzes on Tuesdays Thursdays and Saturdays.  FINDINGS:   3 mm cephalic vein at the wrist which empties into the basilic system at the antecubital level.  2-1/2 mm radial artery.  Excellent thrill at the completion of the procedure.  TECHNIQUE:   The patient was taken to the operating room and had received an axillary block.  The left arm was prepped and draped in usual sterile fashion.  I looked at the cephalic vein with the SonoSite and the forearm cephalic vein although somewhat small looked reasonable.  In emptying into the basilic system at the antecubital level.  The upper arm cephalic vein was very small.  Although there was some calcium in the artery I felt it would be reasonable to try to radiocephalic fistula.  The left arm was prepped and draped in the usual sterile fashion.  After the skin was infiltrated 1% lidocaine an oblique incision was made at the wrist and through this incision I dissected out the cephalic vein.  Branches were divided between ties.  The vein was ligated distally and irrigated up with heparinized saline.  It was of 3 mm vein.  The radial artery was dissected free beneath the fascia.  It was about a 2-1/2 mm artery.  I found an area that did not have significant calcific disease.  The patient was heparinized.  The radial artery was clamped proximally distally and a longitudinal arteriotomy was made.  The artery took a 2-1/2 mm dilator at each end.  The vein was spatulated and sewn end-to-side  to the artery using 2 continuous 6-0 Prolene sutures.  The completion was an excellent thrill in the fistula and a good radial signal distal to the anastomosis.  The heparin was partially reversed with protamine.  The wound was then closed with a deep layer of 3-0 Vicryl and the skin closed with 4-0 Vicryl.  Dermabond was applied.  Patient tolerated procedure well was transferred recovery room in stable condition.  All needle and sponge counts were correct.  Given the complexity of the case a first assistant was necessary in order to expedient the procedure and safely perform the technical aspects of the operation.  Deitra Mayo, MD, FACS [facility]  DATE OF DICTATION:   08/26/2020

## 2020-08-26 NOTE — Anesthesia Procedure Notes (Addendum)
Anesthesia Regional Block: Supraclavicular block   Pre-Anesthetic Checklist: ,, timeout performed, Correct Patient, Correct Site, Correct Laterality, Correct Procedure, Correct Position, site marked, Risks and benefits discussed,  Surgical consent,  Pre-op evaluation,  At surgeon's request and post-op pain management  Laterality: Left and Upper  Prep: chloraprep       Needles:   Needle Type: Echogenic Needle     Needle Length: 9cm  Needle Gauge: 21     Additional Needles:   Procedures:,,,, ultrasound used (permanent image in chart),,,,  Narrative:  Start time: 08/26/2020 7:14 AM End time: 08/26/2020 7:21 AM Injection made incrementally with aspirations every 5 mL.  Performed by: Personally  Anesthesiologist: Annye Asa, MD  Additional Notes: Pt identified in Holding room.  Monitors applied. Working IV access confirmed. Sterile prep L clavicle and neck.  #21ga ECHOgenic needle to supraclav brachial plexus with US guidance.  20cc 1.5% Lidocaine with 1:200k epi injected incrementally after negative test dose.  Patient asymptomatic, VSS, no heme aspirated, tolerated well.  Jenita Seashore, MD

## 2020-08-26 NOTE — Interval H&P Note (Signed)
History and Physical Interval Note:  08/26/2020 7:00 AM  [name]  has presented today for surgery, with the diagnosis of ESRD.  The various methods of treatment have been discussed with the patient and family. After consideration of risks, benefits and other options for treatment, the patient has consented to  Procedure(s): LEFT ARTERIOVENOUS (AV) FISTULA CREATION VERSUS GRAFT PLACEMENT (Left) as a surgical intervention.  The patient's history has been reviewed, patient examined, no change in status, stable for surgery.  I have reviewed the patient's chart and labs.  Questions were answered to the patient's satisfaction.     Deitra Mayo

## 2020-08-27 ENCOUNTER — Encounter (HOSPITAL_COMMUNITY): Payer: Self-pay | Admitting: Vascular Surgery

## 2020-08-27 DIAGNOSIS — Z992 Dependence on renal dialysis: Secondary | ICD-10-CM | POA: Diagnosis not present

## 2020-08-27 DIAGNOSIS — N186 End stage renal disease: Secondary | ICD-10-CM | POA: Diagnosis not present

## 2020-08-27 DIAGNOSIS — N2581 Secondary hyperparathyroidism of renal origin: Secondary | ICD-10-CM | POA: Diagnosis not present

## 2020-08-29 DIAGNOSIS — N2581 Secondary hyperparathyroidism of renal origin: Secondary | ICD-10-CM | POA: Diagnosis not present

## 2020-08-29 DIAGNOSIS — Z992 Dependence on renal dialysis: Secondary | ICD-10-CM | POA: Diagnosis not present

## 2020-08-29 DIAGNOSIS — N186 End stage renal disease: Secondary | ICD-10-CM | POA: Diagnosis not present

## 2020-08-31 DIAGNOSIS — N2581 Secondary hyperparathyroidism of renal origin: Secondary | ICD-10-CM | POA: Diagnosis not present

## 2020-08-31 DIAGNOSIS — Z992 Dependence on renal dialysis: Secondary | ICD-10-CM | POA: Diagnosis not present

## 2020-08-31 DIAGNOSIS — N186 End stage renal disease: Secondary | ICD-10-CM | POA: Diagnosis not present

## 2020-09-02 DIAGNOSIS — E8770 Fluid overload, unspecified: Secondary | ICD-10-CM | POA: Diagnosis not present

## 2020-09-02 DIAGNOSIS — Z992 Dependence on renal dialysis: Secondary | ICD-10-CM | POA: Diagnosis not present

## 2020-09-02 DIAGNOSIS — N2581 Secondary hyperparathyroidism of renal origin: Secondary | ICD-10-CM | POA: Diagnosis not present

## 2020-09-02 DIAGNOSIS — N186 End stage renal disease: Secondary | ICD-10-CM | POA: Diagnosis not present

## 2020-09-03 ENCOUNTER — Other Ambulatory Visit: Payer: Self-pay

## 2020-09-03 DIAGNOSIS — E8770 Fluid overload, unspecified: Secondary | ICD-10-CM | POA: Diagnosis not present

## 2020-09-03 DIAGNOSIS — N186 End stage renal disease: Secondary | ICD-10-CM | POA: Diagnosis not present

## 2020-09-03 DIAGNOSIS — N2581 Secondary hyperparathyroidism of renal origin: Secondary | ICD-10-CM | POA: Diagnosis not present

## 2020-09-03 DIAGNOSIS — Z992 Dependence on renal dialysis: Secondary | ICD-10-CM

## 2020-09-05 DIAGNOSIS — E8770 Fluid overload, unspecified: Secondary | ICD-10-CM | POA: Diagnosis not present

## 2020-09-05 DIAGNOSIS — Z992 Dependence on renal dialysis: Secondary | ICD-10-CM | POA: Diagnosis not present

## 2020-09-05 DIAGNOSIS — N186 End stage renal disease: Secondary | ICD-10-CM | POA: Diagnosis not present

## 2020-09-05 DIAGNOSIS — N2581 Secondary hyperparathyroidism of renal origin: Secondary | ICD-10-CM | POA: Diagnosis not present

## 2020-09-06 ENCOUNTER — Other Ambulatory Visit: Payer: Self-pay | Admitting: Nephrology

## 2020-09-06 ENCOUNTER — Other Ambulatory Visit (HOSPITAL_COMMUNITY): Payer: Self-pay | Admitting: Nephrology

## 2020-09-06 DIAGNOSIS — R188 Other ascites: Secondary | ICD-10-CM

## 2020-09-07 DIAGNOSIS — Z992 Dependence on renal dialysis: Secondary | ICD-10-CM | POA: Diagnosis not present

## 2020-09-07 DIAGNOSIS — N2581 Secondary hyperparathyroidism of renal origin: Secondary | ICD-10-CM | POA: Diagnosis not present

## 2020-09-07 DIAGNOSIS — N186 End stage renal disease: Secondary | ICD-10-CM | POA: Diagnosis not present

## 2020-09-07 DIAGNOSIS — E8770 Fluid overload, unspecified: Secondary | ICD-10-CM | POA: Diagnosis not present

## 2020-09-09 ENCOUNTER — Other Ambulatory Visit (HOSPITAL_COMMUNITY): Payer: Self-pay | Admitting: Nephrology

## 2020-09-10 DIAGNOSIS — N2581 Secondary hyperparathyroidism of renal origin: Secondary | ICD-10-CM | POA: Diagnosis not present

## 2020-09-10 DIAGNOSIS — N186 End stage renal disease: Secondary | ICD-10-CM | POA: Diagnosis not present

## 2020-09-10 DIAGNOSIS — Z992 Dependence on renal dialysis: Secondary | ICD-10-CM | POA: Diagnosis not present

## 2020-09-11 ENCOUNTER — Other Ambulatory Visit (HOSPITAL_COMMUNITY): Payer: Self-pay | Admitting: Nephrology

## 2020-09-11 ENCOUNTER — Other Ambulatory Visit: Payer: Self-pay

## 2020-09-11 ENCOUNTER — Ambulatory Visit (HOSPITAL_COMMUNITY)
Admission: RE | Admit: 2020-09-11 | Discharge: 2020-09-11 | Disposition: A | Discharge status: Home / Self Care | Payer: BC Managed Care – PPO | Source: Ambulatory Visit | Attending: Nephrology | Admitting: Nephrology

## 2020-09-11 DIAGNOSIS — R188 Other ascites: Secondary | ICD-10-CM

## 2020-09-11 MED ORDER — LIDOCAINE HCL (PF) 1 % IJ SOLN
INTRAMUSCULAR | Status: AC
  Filled 2020-09-11: qty 30

## 2020-09-11 NOTE — Progress Notes (Signed)
Patient ID: [name], male   DOB: [birth year], 50 y.o.   MRN: [id number] Pt presented to [place] dept today for therapeutic paracentesis. On limited US abd in all four quadrants there is only minimal ascites noted. Procedure cancelled. Pt updated.

## 2020-09-12 DIAGNOSIS — N2581 Secondary hyperparathyroidism of renal origin: Secondary | ICD-10-CM | POA: Diagnosis not present

## 2020-09-12 DIAGNOSIS — Z992 Dependence on renal dialysis: Secondary | ICD-10-CM | POA: Diagnosis not present

## 2020-09-12 DIAGNOSIS — N186 End stage renal disease: Secondary | ICD-10-CM | POA: Diagnosis not present

## 2020-09-14 DIAGNOSIS — N2581 Secondary hyperparathyroidism of renal origin: Secondary | ICD-10-CM | POA: Diagnosis not present

## 2020-09-14 DIAGNOSIS — N186 End stage renal disease: Secondary | ICD-10-CM | POA: Diagnosis not present

## 2020-09-14 DIAGNOSIS — Z992 Dependence on renal dialysis: Secondary | ICD-10-CM | POA: Diagnosis not present

## 2020-09-17 DIAGNOSIS — N186 End stage renal disease: Secondary | ICD-10-CM | POA: Diagnosis not present

## 2020-09-17 DIAGNOSIS — Z992 Dependence on renal dialysis: Secondary | ICD-10-CM | POA: Diagnosis not present

## 2020-09-17 DIAGNOSIS — N2581 Secondary hyperparathyroidism of renal origin: Secondary | ICD-10-CM | POA: Diagnosis not present

## 2020-09-17 DIAGNOSIS — R110 Nausea: Secondary | ICD-10-CM | POA: Diagnosis not present

## 2020-09-19 DIAGNOSIS — N186 End stage renal disease: Secondary | ICD-10-CM | POA: Diagnosis not present

## 2020-09-19 DIAGNOSIS — Z992 Dependence on renal dialysis: Secondary | ICD-10-CM | POA: Diagnosis not present

## 2020-09-19 DIAGNOSIS — N2581 Secondary hyperparathyroidism of renal origin: Secondary | ICD-10-CM | POA: Diagnosis not present

## 2020-09-19 DIAGNOSIS — R110 Nausea: Secondary | ICD-10-CM | POA: Diagnosis not present

## 2020-09-21 DIAGNOSIS — N2581 Secondary hyperparathyroidism of renal origin: Secondary | ICD-10-CM | POA: Diagnosis not present

## 2020-09-21 DIAGNOSIS — Z992 Dependence on renal dialysis: Secondary | ICD-10-CM | POA: Diagnosis not present

## 2020-09-21 DIAGNOSIS — N186 End stage renal disease: Secondary | ICD-10-CM | POA: Diagnosis not present

## 2020-09-21 DIAGNOSIS — R110 Nausea: Secondary | ICD-10-CM | POA: Diagnosis not present

## 2020-09-24 DIAGNOSIS — N2581 Secondary hyperparathyroidism of renal origin: Secondary | ICD-10-CM | POA: Diagnosis not present

## 2020-09-24 DIAGNOSIS — Z992 Dependence on renal dialysis: Secondary | ICD-10-CM | POA: Diagnosis not present

## 2020-09-24 DIAGNOSIS — M3214 Glomerular disease in systemic lupus erythematosus: Secondary | ICD-10-CM | POA: Diagnosis not present

## 2020-09-24 DIAGNOSIS — N186 End stage renal disease: Secondary | ICD-10-CM | POA: Diagnosis not present

## 2020-09-26 DIAGNOSIS — Z992 Dependence on renal dialysis: Secondary | ICD-10-CM | POA: Diagnosis not present

## 2020-09-26 DIAGNOSIS — R110 Nausea: Secondary | ICD-10-CM | POA: Diagnosis not present

## 2020-09-26 DIAGNOSIS — N2581 Secondary hyperparathyroidism of renal origin: Secondary | ICD-10-CM | POA: Diagnosis not present

## 2020-09-26 DIAGNOSIS — N186 End stage renal disease: Secondary | ICD-10-CM | POA: Diagnosis not present

## 2020-09-28 DIAGNOSIS — N186 End stage renal disease: Secondary | ICD-10-CM | POA: Diagnosis not present

## 2020-09-28 DIAGNOSIS — R110 Nausea: Secondary | ICD-10-CM | POA: Diagnosis not present

## 2020-09-28 DIAGNOSIS — N2581 Secondary hyperparathyroidism of renal origin: Secondary | ICD-10-CM | POA: Diagnosis not present

## 2020-09-28 DIAGNOSIS — Z992 Dependence on renal dialysis: Secondary | ICD-10-CM | POA: Diagnosis not present

## 2020-10-01 DIAGNOSIS — N186 End stage renal disease: Secondary | ICD-10-CM | POA: Diagnosis not present

## 2020-10-01 DIAGNOSIS — Z992 Dependence on renal dialysis: Secondary | ICD-10-CM | POA: Insufficient documentation

## 2020-10-01 DIAGNOSIS — M1990 Unspecified osteoarthritis, unspecified site: Secondary | ICD-10-CM | POA: Insufficient documentation

## 2020-10-01 DIAGNOSIS — D696 Thrombocytopenia, unspecified: Secondary | ICD-10-CM | POA: Insufficient documentation

## 2020-10-01 DIAGNOSIS — N2581 Secondary hyperparathyroidism of renal origin: Secondary | ICD-10-CM | POA: Diagnosis not present

## 2020-10-01 DIAGNOSIS — R188 Other ascites: Secondary | ICD-10-CM | POA: Insufficient documentation

## 2020-10-01 DIAGNOSIS — N289 Disorder of kidney and ureter, unspecified: Secondary | ICD-10-CM | POA: Insufficient documentation

## 2020-10-01 DIAGNOSIS — M3214 Glomerular disease in systemic lupus erythematosus: Secondary | ICD-10-CM | POA: Insufficient documentation

## 2020-10-01 DIAGNOSIS — I10 Essential (primary) hypertension: Secondary | ICD-10-CM | POA: Insufficient documentation

## 2020-10-02 ENCOUNTER — Other Ambulatory Visit: Payer: Self-pay

## 2020-10-02 ENCOUNTER — Ambulatory Visit (HOSPITAL_COMMUNITY)
Admission: RE | Admit: 2020-10-02 | Discharge: 2020-10-02 | Disposition: A | Discharge status: Home / Self Care | Payer: BC Managed Care – PPO | Source: Ambulatory Visit | Attending: Vascular Surgery | Admitting: Vascular Surgery

## 2020-10-02 ENCOUNTER — Ambulatory Visit (INDEPENDENT_AMBULATORY_CARE_PROVIDER_SITE_OTHER): Payer: BC Managed Care – PPO | Admitting: Physician Assistant

## 2020-10-02 VITALS — BP 158/80 | HR 78 | Temp 97.6°F | Resp 20 | Ht 71.0 in | Wt 166.0 lb

## 2020-10-02 DIAGNOSIS — N186 End stage renal disease: Secondary | ICD-10-CM | POA: Diagnosis not present

## 2020-10-02 DIAGNOSIS — Z992 Dependence on renal dialysis: Secondary | ICD-10-CM

## 2020-10-02 NOTE — Progress Notes (Signed)
POST OPERATIVE DIALYSIS ACCESS OFFICE NOTE    CC:  F/u for dialysis access surgery  HPI:  This is a 50 y.o. male who is s/p left radiocephalic arteriovenous fistula creation by Dr. Scot Dock on Aug 26, 2020.  The patient denies hand pain or numbness.  He is dialyzing via right tunneled dialysis catheter without complications.   Dialysis days: Tuesdays, Thursdays and Saturdays Dialysis center:  [facility]  No Known Allergies  Current Outpatient Medications  Medication Sig Dispense Refill  . acetaminophen (TYLENOL) 325 MG tablet Take 2 tablets (650 mg total) by mouth every 4 (four) hours as needed for headache or mild pain.    . vitamin B-12 (CYANOCOBALAMIN) 1000 MCG tablet Take 1,000 mcg by mouth daily.    . Vitamin D, Ergocalciferol, (DRISDOL) 1.25 MG (50000 UNIT) CAPS capsule Take 1 capsule (50,000 Units total) by mouth every 7 (seven) days. 5 capsule 5  . famotidine (PEPCID) 20 MG tablet Take 1 tablet (20 mg total) by mouth daily. (Patient not taking: Reported on 08/21/2020) 60 tablet 0  . folic acid (FOLVITE) 1 MG tablet Take 1 tablet by mouth once daily (Patient not taking: No sig reported) 90 tablet 0  . lactulose (CHRONULAC) 10 GM/15ML solution Take 30 mLs (20 g total) by mouth 2 (two) times daily as needed for moderate constipation. y. (Patient not taking: No sig reported) 236 mL 0  . levothyroxine (SYNTHROID) 50 MCG tablet Take 1 tablet (50 mcg total) by mouth daily before breakfast. (Patient not taking: No sig reported) 30 tablet 0  . mycophenolate (CELLCEPT) 250 MG capsule Take 250 mg by mouth daily. (Patient not taking: Reported on 10/02/2020)    . Nutritional Supplements (,FEEDING SUPPLEMENT, PROSOURCE PLUS) liquid Take 30 mLs by mouth 3 (three) times daily between meals. (Patient not taking: No sig reported) 887 mL 0  . Nutritional Supplements (FEEDING SUPPLEMENT, NEPRO CARB STEADY,) LIQD Take 237 mLs by mouth daily. (Patient not taking: No sig reported)  0  . oxyCODONE-acetaminophen  (PERCOCET) 5-325 MG tablet Take 1 tablet by mouth every 6 (six) hours as needed for severe pain. (Patient not taking: Reported on 10/02/2020) 8 tablet 0  . predniSONE (DELTASONE) 10 MG tablet Take 10 mg by mouth daily. (Patient not taking: Reported on 10/02/2020)     No current facility-administered medications for this visit.     ROS:  See HPI  BP (!) 158/80 (BP Location: Right Arm, Patient Position: Sitting, Cuff Size: Normal)   Pulse 78   Temp 97.6 F (36.4 C) (Temporal)   Resp 20   Ht '5\' 11"'$  (1.803 m)   Wt 166 lb (75.3 kg)   SpO2 99%   BMI 23.15 kg/m    Physical Exam:  General appearance: awake, alert in NAD Chest: catheter site without bleeding or erythema. Dressing dry and intact. Respirations: unlabored; no dyspnea at rest Left upper extremity: Hand is warm with 5/5 grip strength. Motor function and sensation intact. Good bruit and thrill in fistula.  Incision(s): Well approximated. Healing nicely.  Dialysis duplex on 10/02/2020 Review of his Doppler exam today reveals fistula depth less than 4 mm.  Diameter of the fistula averages 0.51 to 0.68 cm  Assessment/Plan:   -pt does not have evidence of steal syndrome -dialysis duplex today reveals fistula to be of adequate diameter and depth for access. Continue use of squeeze ball. -the fistula/graft may be used starting November 26, 2020  Barbie Banner, [place] 10/02/2020 3:19 PM [facility]  MD:  Dr. Scot Dock

## 2020-10-03 DIAGNOSIS — N186 End stage renal disease: Secondary | ICD-10-CM | POA: Diagnosis not present

## 2020-10-03 DIAGNOSIS — Z992 Dependence on renal dialysis: Secondary | ICD-10-CM | POA: Diagnosis not present

## 2020-10-03 DIAGNOSIS — N2581 Secondary hyperparathyroidism of renal origin: Secondary | ICD-10-CM | POA: Diagnosis not present

## 2020-10-05 DIAGNOSIS — Z992 Dependence on renal dialysis: Secondary | ICD-10-CM | POA: Diagnosis not present

## 2020-10-05 DIAGNOSIS — N2581 Secondary hyperparathyroidism of renal origin: Secondary | ICD-10-CM | POA: Diagnosis not present

## 2020-10-05 DIAGNOSIS — N186 End stage renal disease: Secondary | ICD-10-CM | POA: Diagnosis not present

## 2020-10-08 DIAGNOSIS — N186 End stage renal disease: Secondary | ICD-10-CM | POA: Diagnosis not present

## 2020-10-08 DIAGNOSIS — N2581 Secondary hyperparathyroidism of renal origin: Secondary | ICD-10-CM | POA: Diagnosis not present

## 2020-10-08 DIAGNOSIS — Z992 Dependence on renal dialysis: Secondary | ICD-10-CM | POA: Diagnosis not present

## 2020-10-10 DIAGNOSIS — N2581 Secondary hyperparathyroidism of renal origin: Secondary | ICD-10-CM | POA: Diagnosis not present

## 2020-10-10 DIAGNOSIS — Z992 Dependence on renal dialysis: Secondary | ICD-10-CM | POA: Diagnosis not present

## 2020-10-10 DIAGNOSIS — N186 End stage renal disease: Secondary | ICD-10-CM | POA: Diagnosis not present

## 2020-10-12 DIAGNOSIS — N2581 Secondary hyperparathyroidism of renal origin: Secondary | ICD-10-CM | POA: Diagnosis not present

## 2020-10-12 DIAGNOSIS — N186 End stage renal disease: Secondary | ICD-10-CM | POA: Diagnosis not present

## 2020-10-12 DIAGNOSIS — Z992 Dependence on renal dialysis: Secondary | ICD-10-CM | POA: Diagnosis not present

## 2020-10-15 DIAGNOSIS — Z992 Dependence on renal dialysis: Secondary | ICD-10-CM | POA: Diagnosis not present

## 2020-10-15 DIAGNOSIS — N186 End stage renal disease: Secondary | ICD-10-CM | POA: Diagnosis not present

## 2020-10-15 DIAGNOSIS — N2581 Secondary hyperparathyroidism of renal origin: Secondary | ICD-10-CM | POA: Diagnosis not present

## 2020-10-15 DIAGNOSIS — R110 Nausea: Secondary | ICD-10-CM | POA: Diagnosis not present

## 2020-10-17 DIAGNOSIS — N186 End stage renal disease: Secondary | ICD-10-CM | POA: Diagnosis not present

## 2020-10-17 DIAGNOSIS — N2581 Secondary hyperparathyroidism of renal origin: Secondary | ICD-10-CM | POA: Diagnosis not present

## 2020-10-17 DIAGNOSIS — Z992 Dependence on renal dialysis: Secondary | ICD-10-CM | POA: Diagnosis not present

## 2020-10-17 DIAGNOSIS — R110 Nausea: Secondary | ICD-10-CM | POA: Diagnosis not present

## 2020-10-19 DIAGNOSIS — Z992 Dependence on renal dialysis: Secondary | ICD-10-CM | POA: Diagnosis not present

## 2020-10-19 DIAGNOSIS — N2581 Secondary hyperparathyroidism of renal origin: Secondary | ICD-10-CM | POA: Diagnosis not present

## 2020-10-19 DIAGNOSIS — R110 Nausea: Secondary | ICD-10-CM | POA: Diagnosis not present

## 2020-10-19 DIAGNOSIS — N186 End stage renal disease: Secondary | ICD-10-CM | POA: Diagnosis not present

## 2020-10-22 DIAGNOSIS — N186 End stage renal disease: Secondary | ICD-10-CM | POA: Diagnosis not present

## 2020-10-22 DIAGNOSIS — N2581 Secondary hyperparathyroidism of renal origin: Secondary | ICD-10-CM | POA: Diagnosis not present

## 2020-10-22 DIAGNOSIS — Z992 Dependence on renal dialysis: Secondary | ICD-10-CM | POA: Diagnosis not present

## 2020-10-24 DIAGNOSIS — N186 End stage renal disease: Secondary | ICD-10-CM | POA: Diagnosis not present

## 2020-10-24 DIAGNOSIS — N2581 Secondary hyperparathyroidism of renal origin: Secondary | ICD-10-CM | POA: Diagnosis not present

## 2020-10-24 DIAGNOSIS — Z992 Dependence on renal dialysis: Secondary | ICD-10-CM | POA: Diagnosis not present

## 2020-10-24 DIAGNOSIS — M3214 Glomerular disease in systemic lupus erythematosus: Secondary | ICD-10-CM | POA: Diagnosis not present

## 2020-10-26 DIAGNOSIS — Z992 Dependence on renal dialysis: Secondary | ICD-10-CM | POA: Diagnosis not present

## 2020-10-26 DIAGNOSIS — N186 End stage renal disease: Secondary | ICD-10-CM | POA: Diagnosis not present

## 2020-10-26 DIAGNOSIS — N2581 Secondary hyperparathyroidism of renal origin: Secondary | ICD-10-CM | POA: Diagnosis not present

## 2020-10-29 DIAGNOSIS — N2581 Secondary hyperparathyroidism of renal origin: Secondary | ICD-10-CM | POA: Diagnosis not present

## 2020-10-29 DIAGNOSIS — Z992 Dependence on renal dialysis: Secondary | ICD-10-CM | POA: Diagnosis not present

## 2020-10-29 DIAGNOSIS — N186 End stage renal disease: Secondary | ICD-10-CM | POA: Diagnosis not present

## 2020-11-05 DIAGNOSIS — N186 End stage renal disease: Secondary | ICD-10-CM | POA: Diagnosis not present

## 2020-11-05 DIAGNOSIS — Z992 Dependence on renal dialysis: Secondary | ICD-10-CM | POA: Diagnosis not present

## 2020-11-05 DIAGNOSIS — N2581 Secondary hyperparathyroidism of renal origin: Secondary | ICD-10-CM | POA: Diagnosis not present

## 2020-11-07 DIAGNOSIS — Z992 Dependence on renal dialysis: Secondary | ICD-10-CM | POA: Diagnosis not present

## 2020-11-07 DIAGNOSIS — N2581 Secondary hyperparathyroidism of renal origin: Secondary | ICD-10-CM | POA: Diagnosis not present

## 2020-11-07 DIAGNOSIS — N186 End stage renal disease: Secondary | ICD-10-CM | POA: Diagnosis not present

## 2020-11-09 DIAGNOSIS — Z992 Dependence on renal dialysis: Secondary | ICD-10-CM | POA: Diagnosis not present

## 2020-11-09 DIAGNOSIS — N2581 Secondary hyperparathyroidism of renal origin: Secondary | ICD-10-CM | POA: Diagnosis not present

## 2020-11-09 DIAGNOSIS — N186 End stage renal disease: Secondary | ICD-10-CM | POA: Diagnosis not present

## 2020-11-12 DIAGNOSIS — N2581 Secondary hyperparathyroidism of renal origin: Secondary | ICD-10-CM | POA: Diagnosis not present

## 2020-11-12 DIAGNOSIS — R110 Nausea: Secondary | ICD-10-CM | POA: Diagnosis not present

## 2020-11-12 DIAGNOSIS — N186 End stage renal disease: Secondary | ICD-10-CM | POA: Diagnosis not present

## 2020-11-12 DIAGNOSIS — Z992 Dependence on renal dialysis: Secondary | ICD-10-CM | POA: Diagnosis not present

## 2020-11-14 DIAGNOSIS — Z992 Dependence on renal dialysis: Secondary | ICD-10-CM | POA: Diagnosis not present

## 2020-11-14 DIAGNOSIS — N186 End stage renal disease: Secondary | ICD-10-CM | POA: Diagnosis not present

## 2020-11-14 DIAGNOSIS — R110 Nausea: Secondary | ICD-10-CM | POA: Diagnosis not present

## 2020-11-14 DIAGNOSIS — N2581 Secondary hyperparathyroidism of renal origin: Secondary | ICD-10-CM | POA: Diagnosis not present

## 2020-11-16 DIAGNOSIS — N2581 Secondary hyperparathyroidism of renal origin: Secondary | ICD-10-CM | POA: Diagnosis not present

## 2020-11-16 DIAGNOSIS — Z992 Dependence on renal dialysis: Secondary | ICD-10-CM | POA: Diagnosis not present

## 2020-11-16 DIAGNOSIS — R110 Nausea: Secondary | ICD-10-CM | POA: Diagnosis not present

## 2020-11-16 DIAGNOSIS — N186 End stage renal disease: Secondary | ICD-10-CM | POA: Diagnosis not present

## 2020-11-19 DIAGNOSIS — N186 End stage renal disease: Secondary | ICD-10-CM | POA: Diagnosis not present

## 2020-11-19 DIAGNOSIS — Z992 Dependence on renal dialysis: Secondary | ICD-10-CM | POA: Diagnosis not present

## 2020-11-19 DIAGNOSIS — N2581 Secondary hyperparathyroidism of renal origin: Secondary | ICD-10-CM | POA: Diagnosis not present

## 2020-11-21 DIAGNOSIS — Z992 Dependence on renal dialysis: Secondary | ICD-10-CM | POA: Diagnosis not present

## 2020-11-21 DIAGNOSIS — N2581 Secondary hyperparathyroidism of renal origin: Secondary | ICD-10-CM | POA: Diagnosis not present

## 2020-11-21 DIAGNOSIS — N186 End stage renal disease: Secondary | ICD-10-CM | POA: Diagnosis not present

## 2020-11-23 DIAGNOSIS — N2581 Secondary hyperparathyroidism of renal origin: Secondary | ICD-10-CM | POA: Diagnosis not present

## 2020-11-23 DIAGNOSIS — N186 End stage renal disease: Secondary | ICD-10-CM | POA: Diagnosis not present

## 2020-11-23 DIAGNOSIS — Z992 Dependence on renal dialysis: Secondary | ICD-10-CM | POA: Diagnosis not present

## 2020-11-24 DIAGNOSIS — M3214 Glomerular disease in systemic lupus erythematosus: Secondary | ICD-10-CM | POA: Diagnosis not present

## 2020-11-24 DIAGNOSIS — Z992 Dependence on renal dialysis: Secondary | ICD-10-CM | POA: Diagnosis not present

## 2020-11-24 DIAGNOSIS — N186 End stage renal disease: Secondary | ICD-10-CM | POA: Diagnosis not present

## 2020-11-26 DIAGNOSIS — Z992 Dependence on renal dialysis: Secondary | ICD-10-CM | POA: Diagnosis not present

## 2020-11-26 DIAGNOSIS — R110 Nausea: Secondary | ICD-10-CM | POA: Diagnosis not present

## 2020-11-26 DIAGNOSIS — N186 End stage renal disease: Secondary | ICD-10-CM | POA: Diagnosis not present

## 2020-11-26 DIAGNOSIS — N2581 Secondary hyperparathyroidism of renal origin: Secondary | ICD-10-CM | POA: Diagnosis not present

## 2020-11-27 DIAGNOSIS — M1990 Unspecified osteoarthritis, unspecified site: Secondary | ICD-10-CM | POA: Diagnosis not present

## 2020-11-27 DIAGNOSIS — Z8679 Personal history of other diseases of the circulatory system: Secondary | ICD-10-CM | POA: Diagnosis not present

## 2020-11-27 DIAGNOSIS — M329 Systemic lupus erythematosus, unspecified: Secondary | ICD-10-CM | POA: Diagnosis not present

## 2020-11-27 DIAGNOSIS — D72819 Decreased white blood cell count, unspecified: Secondary | ICD-10-CM | POA: Diagnosis not present

## 2020-11-28 DIAGNOSIS — N2581 Secondary hyperparathyroidism of renal origin: Secondary | ICD-10-CM | POA: Diagnosis not present

## 2020-11-28 DIAGNOSIS — N186 End stage renal disease: Secondary | ICD-10-CM | POA: Diagnosis not present

## 2020-11-28 DIAGNOSIS — Z992 Dependence on renal dialysis: Secondary | ICD-10-CM | POA: Diagnosis not present

## 2020-11-28 DIAGNOSIS — R110 Nausea: Secondary | ICD-10-CM | POA: Diagnosis not present

## 2020-12-03 DIAGNOSIS — Z992 Dependence on renal dialysis: Secondary | ICD-10-CM | POA: Diagnosis not present

## 2020-12-03 DIAGNOSIS — N186 End stage renal disease: Secondary | ICD-10-CM | POA: Diagnosis not present

## 2020-12-03 DIAGNOSIS — N2581 Secondary hyperparathyroidism of renal origin: Secondary | ICD-10-CM | POA: Diagnosis not present

## 2020-12-05 DIAGNOSIS — N2581 Secondary hyperparathyroidism of renal origin: Secondary | ICD-10-CM | POA: Diagnosis not present

## 2020-12-05 DIAGNOSIS — N186 End stage renal disease: Secondary | ICD-10-CM | POA: Diagnosis not present

## 2020-12-05 DIAGNOSIS — Z992 Dependence on renal dialysis: Secondary | ICD-10-CM | POA: Diagnosis not present

## 2020-12-07 DIAGNOSIS — N186 End stage renal disease: Secondary | ICD-10-CM | POA: Diagnosis not present

## 2020-12-07 DIAGNOSIS — Z992 Dependence on renal dialysis: Secondary | ICD-10-CM | POA: Diagnosis not present

## 2020-12-07 DIAGNOSIS — N2581 Secondary hyperparathyroidism of renal origin: Secondary | ICD-10-CM | POA: Diagnosis not present

## 2020-12-10 DIAGNOSIS — N186 End stage renal disease: Secondary | ICD-10-CM | POA: Diagnosis not present

## 2020-12-10 DIAGNOSIS — Z992 Dependence on renal dialysis: Secondary | ICD-10-CM | POA: Diagnosis not present

## 2020-12-10 DIAGNOSIS — N2581 Secondary hyperparathyroidism of renal origin: Secondary | ICD-10-CM | POA: Diagnosis not present

## 2020-12-12 DIAGNOSIS — Z992 Dependence on renal dialysis: Secondary | ICD-10-CM | POA: Diagnosis not present

## 2020-12-12 DIAGNOSIS — N186 End stage renal disease: Secondary | ICD-10-CM | POA: Diagnosis not present

## 2020-12-12 DIAGNOSIS — N2581 Secondary hyperparathyroidism of renal origin: Secondary | ICD-10-CM | POA: Diagnosis not present

## 2020-12-14 DIAGNOSIS — N186 End stage renal disease: Secondary | ICD-10-CM | POA: Diagnosis not present

## 2020-12-14 DIAGNOSIS — Z992 Dependence on renal dialysis: Secondary | ICD-10-CM | POA: Diagnosis not present

## 2020-12-14 DIAGNOSIS — N2581 Secondary hyperparathyroidism of renal origin: Secondary | ICD-10-CM | POA: Diagnosis not present

## 2020-12-17 DIAGNOSIS — N2581 Secondary hyperparathyroidism of renal origin: Secondary | ICD-10-CM | POA: Diagnosis not present

## 2020-12-17 DIAGNOSIS — N186 End stage renal disease: Secondary | ICD-10-CM | POA: Diagnosis not present

## 2020-12-17 DIAGNOSIS — Z992 Dependence on renal dialysis: Secondary | ICD-10-CM | POA: Diagnosis not present

## 2020-12-21 DIAGNOSIS — Z992 Dependence on renal dialysis: Secondary | ICD-10-CM | POA: Diagnosis not present

## 2020-12-21 DIAGNOSIS — N186 End stage renal disease: Secondary | ICD-10-CM | POA: Diagnosis not present

## 2020-12-21 DIAGNOSIS — N2581 Secondary hyperparathyroidism of renal origin: Secondary | ICD-10-CM | POA: Diagnosis not present

## 2020-12-24 DIAGNOSIS — N2581 Secondary hyperparathyroidism of renal origin: Secondary | ICD-10-CM | POA: Diagnosis not present

## 2020-12-24 DIAGNOSIS — Z992 Dependence on renal dialysis: Secondary | ICD-10-CM | POA: Diagnosis not present

## 2020-12-24 DIAGNOSIS — N186 End stage renal disease: Secondary | ICD-10-CM | POA: Diagnosis not present

## 2020-12-25 DIAGNOSIS — N186 End stage renal disease: Secondary | ICD-10-CM | POA: Diagnosis not present

## 2020-12-25 DIAGNOSIS — M3214 Glomerular disease in systemic lupus erythematosus: Secondary | ICD-10-CM | POA: Diagnosis not present

## 2020-12-25 DIAGNOSIS — Z992 Dependence on renal dialysis: Secondary | ICD-10-CM | POA: Diagnosis not present

## 2020-12-28 DIAGNOSIS — N2581 Secondary hyperparathyroidism of renal origin: Secondary | ICD-10-CM | POA: Diagnosis not present

## 2020-12-28 DIAGNOSIS — N186 End stage renal disease: Secondary | ICD-10-CM | POA: Diagnosis not present

## 2020-12-28 DIAGNOSIS — Z992 Dependence on renal dialysis: Secondary | ICD-10-CM | POA: Diagnosis not present

## 2020-12-31 DIAGNOSIS — N186 End stage renal disease: Secondary | ICD-10-CM | POA: Diagnosis not present

## 2020-12-31 DIAGNOSIS — Z992 Dependence on renal dialysis: Secondary | ICD-10-CM | POA: Diagnosis not present

## 2020-12-31 DIAGNOSIS — N2581 Secondary hyperparathyroidism of renal origin: Secondary | ICD-10-CM | POA: Diagnosis not present

## 2021-01-02 DIAGNOSIS — Z992 Dependence on renal dialysis: Secondary | ICD-10-CM | POA: Diagnosis not present

## 2021-01-02 DIAGNOSIS — N186 End stage renal disease: Secondary | ICD-10-CM | POA: Diagnosis not present

## 2021-01-02 DIAGNOSIS — N2581 Secondary hyperparathyroidism of renal origin: Secondary | ICD-10-CM | POA: Diagnosis not present

## 2021-01-04 DIAGNOSIS — N2581 Secondary hyperparathyroidism of renal origin: Secondary | ICD-10-CM | POA: Diagnosis not present

## 2021-01-04 DIAGNOSIS — N186 End stage renal disease: Secondary | ICD-10-CM | POA: Diagnosis not present

## 2021-01-04 DIAGNOSIS — Z992 Dependence on renal dialysis: Secondary | ICD-10-CM | POA: Diagnosis not present

## 2021-01-09 DIAGNOSIS — N186 End stage renal disease: Secondary | ICD-10-CM | POA: Diagnosis not present

## 2021-01-09 DIAGNOSIS — Z992 Dependence on renal dialysis: Secondary | ICD-10-CM | POA: Diagnosis not present

## 2021-01-09 DIAGNOSIS — N2581 Secondary hyperparathyroidism of renal origin: Secondary | ICD-10-CM | POA: Diagnosis not present

## 2021-01-11 DIAGNOSIS — N186 End stage renal disease: Secondary | ICD-10-CM | POA: Diagnosis not present

## 2021-01-11 DIAGNOSIS — Z992 Dependence on renal dialysis: Secondary | ICD-10-CM | POA: Diagnosis not present

## 2021-01-11 DIAGNOSIS — N2581 Secondary hyperparathyroidism of renal origin: Secondary | ICD-10-CM | POA: Diagnosis not present

## 2021-01-14 DIAGNOSIS — N2581 Secondary hyperparathyroidism of renal origin: Secondary | ICD-10-CM | POA: Diagnosis not present

## 2021-01-14 DIAGNOSIS — Z992 Dependence on renal dialysis: Secondary | ICD-10-CM | POA: Diagnosis not present

## 2021-01-14 DIAGNOSIS — N186 End stage renal disease: Secondary | ICD-10-CM | POA: Diagnosis not present

## 2021-01-16 DIAGNOSIS — N186 End stage renal disease: Secondary | ICD-10-CM | POA: Diagnosis not present

## 2021-01-16 DIAGNOSIS — N2581 Secondary hyperparathyroidism of renal origin: Secondary | ICD-10-CM | POA: Diagnosis not present

## 2021-01-16 DIAGNOSIS — Z992 Dependence on renal dialysis: Secondary | ICD-10-CM | POA: Diagnosis not present

## 2021-01-18 DIAGNOSIS — Z992 Dependence on renal dialysis: Secondary | ICD-10-CM | POA: Diagnosis not present

## 2021-01-18 DIAGNOSIS — N2581 Secondary hyperparathyroidism of renal origin: Secondary | ICD-10-CM | POA: Diagnosis not present

## 2021-01-18 DIAGNOSIS — N186 End stage renal disease: Secondary | ICD-10-CM | POA: Diagnosis not present

## 2021-01-21 DIAGNOSIS — N186 End stage renal disease: Secondary | ICD-10-CM | POA: Diagnosis not present

## 2021-01-21 DIAGNOSIS — N2581 Secondary hyperparathyroidism of renal origin: Secondary | ICD-10-CM | POA: Diagnosis not present

## 2021-01-21 DIAGNOSIS — Z992 Dependence on renal dialysis: Secondary | ICD-10-CM | POA: Diagnosis not present

## 2021-01-21 DIAGNOSIS — Z23 Encounter for immunization: Secondary | ICD-10-CM | POA: Diagnosis not present

## 2021-01-22 DIAGNOSIS — Z992 Dependence on renal dialysis: Secondary | ICD-10-CM | POA: Diagnosis not present

## 2021-01-22 DIAGNOSIS — Z452 Encounter for adjustment and management of vascular access device: Secondary | ICD-10-CM | POA: Diagnosis not present

## 2021-01-22 DIAGNOSIS — N186 End stage renal disease: Secondary | ICD-10-CM | POA: Diagnosis not present

## 2021-01-23 DIAGNOSIS — N186 End stage renal disease: Secondary | ICD-10-CM | POA: Diagnosis not present

## 2021-01-23 DIAGNOSIS — Z992 Dependence on renal dialysis: Secondary | ICD-10-CM | POA: Diagnosis not present

## 2021-01-23 DIAGNOSIS — Z23 Encounter for immunization: Secondary | ICD-10-CM | POA: Diagnosis not present

## 2021-01-23 DIAGNOSIS — N2581 Secondary hyperparathyroidism of renal origin: Secondary | ICD-10-CM | POA: Diagnosis not present

## 2021-01-24 DIAGNOSIS — M3214 Glomerular disease in systemic lupus erythematosus: Secondary | ICD-10-CM | POA: Diagnosis not present

## 2021-01-24 DIAGNOSIS — N186 End stage renal disease: Secondary | ICD-10-CM | POA: Diagnosis not present

## 2021-01-24 DIAGNOSIS — Z992 Dependence on renal dialysis: Secondary | ICD-10-CM | POA: Diagnosis not present

## 2021-01-25 DIAGNOSIS — Z992 Dependence on renal dialysis: Secondary | ICD-10-CM | POA: Diagnosis not present

## 2021-01-25 DIAGNOSIS — N186 End stage renal disease: Secondary | ICD-10-CM | POA: Diagnosis not present

## 2021-01-25 DIAGNOSIS — N2581 Secondary hyperparathyroidism of renal origin: Secondary | ICD-10-CM | POA: Diagnosis not present

## 2021-01-28 DIAGNOSIS — N186 End stage renal disease: Secondary | ICD-10-CM | POA: Diagnosis not present

## 2021-01-28 DIAGNOSIS — N2581 Secondary hyperparathyroidism of renal origin: Secondary | ICD-10-CM | POA: Diagnosis not present

## 2021-01-28 DIAGNOSIS — Z992 Dependence on renal dialysis: Secondary | ICD-10-CM | POA: Diagnosis not present

## 2021-01-30 DIAGNOSIS — Z992 Dependence on renal dialysis: Secondary | ICD-10-CM | POA: Diagnosis not present

## 2021-01-30 DIAGNOSIS — N186 End stage renal disease: Secondary | ICD-10-CM | POA: Diagnosis not present

## 2021-01-30 DIAGNOSIS — N2581 Secondary hyperparathyroidism of renal origin: Secondary | ICD-10-CM | POA: Diagnosis not present

## 2021-02-01 DIAGNOSIS — Z992 Dependence on renal dialysis: Secondary | ICD-10-CM | POA: Diagnosis not present

## 2021-02-01 DIAGNOSIS — N2581 Secondary hyperparathyroidism of renal origin: Secondary | ICD-10-CM | POA: Diagnosis not present

## 2021-02-01 DIAGNOSIS — N186 End stage renal disease: Secondary | ICD-10-CM | POA: Diagnosis not present

## 2021-02-04 DIAGNOSIS — N186 End stage renal disease: Secondary | ICD-10-CM | POA: Diagnosis not present

## 2021-02-04 DIAGNOSIS — N2581 Secondary hyperparathyroidism of renal origin: Secondary | ICD-10-CM | POA: Diagnosis not present

## 2021-02-04 DIAGNOSIS — Z992 Dependence on renal dialysis: Secondary | ICD-10-CM | POA: Diagnosis not present

## 2021-02-06 DIAGNOSIS — Z992 Dependence on renal dialysis: Secondary | ICD-10-CM | POA: Diagnosis not present

## 2021-02-06 DIAGNOSIS — N186 End stage renal disease: Secondary | ICD-10-CM | POA: Diagnosis not present

## 2021-02-06 DIAGNOSIS — N2581 Secondary hyperparathyroidism of renal origin: Secondary | ICD-10-CM | POA: Diagnosis not present

## 2021-02-08 DIAGNOSIS — N186 End stage renal disease: Secondary | ICD-10-CM | POA: Diagnosis not present

## 2021-02-08 DIAGNOSIS — Z992 Dependence on renal dialysis: Secondary | ICD-10-CM | POA: Diagnosis not present

## 2021-02-08 DIAGNOSIS — N2581 Secondary hyperparathyroidism of renal origin: Secondary | ICD-10-CM | POA: Diagnosis not present

## 2021-02-11 DIAGNOSIS — N186 End stage renal disease: Secondary | ICD-10-CM | POA: Diagnosis not present

## 2021-02-11 DIAGNOSIS — Z992 Dependence on renal dialysis: Secondary | ICD-10-CM | POA: Diagnosis not present

## 2021-02-11 DIAGNOSIS — R110 Nausea: Secondary | ICD-10-CM | POA: Diagnosis not present

## 2021-02-11 DIAGNOSIS — N2581 Secondary hyperparathyroidism of renal origin: Secondary | ICD-10-CM | POA: Diagnosis not present

## 2021-02-13 DIAGNOSIS — N186 End stage renal disease: Secondary | ICD-10-CM | POA: Diagnosis not present

## 2021-02-13 DIAGNOSIS — Z992 Dependence on renal dialysis: Secondary | ICD-10-CM | POA: Diagnosis not present

## 2021-02-13 DIAGNOSIS — R110 Nausea: Secondary | ICD-10-CM | POA: Diagnosis not present

## 2021-02-13 DIAGNOSIS — N2581 Secondary hyperparathyroidism of renal origin: Secondary | ICD-10-CM | POA: Diagnosis not present

## 2021-02-15 DIAGNOSIS — R110 Nausea: Secondary | ICD-10-CM | POA: Diagnosis not present

## 2021-02-15 DIAGNOSIS — N186 End stage renal disease: Secondary | ICD-10-CM | POA: Diagnosis not present

## 2021-02-15 DIAGNOSIS — N2581 Secondary hyperparathyroidism of renal origin: Secondary | ICD-10-CM | POA: Diagnosis not present

## 2021-02-15 DIAGNOSIS — Z992 Dependence on renal dialysis: Secondary | ICD-10-CM | POA: Diagnosis not present

## 2021-02-18 DIAGNOSIS — N186 End stage renal disease: Secondary | ICD-10-CM | POA: Diagnosis not present

## 2021-02-18 DIAGNOSIS — Z992 Dependence on renal dialysis: Secondary | ICD-10-CM | POA: Diagnosis not present

## 2021-02-18 DIAGNOSIS — N2581 Secondary hyperparathyroidism of renal origin: Secondary | ICD-10-CM | POA: Diagnosis not present

## 2021-02-20 DIAGNOSIS — N2581 Secondary hyperparathyroidism of renal origin: Secondary | ICD-10-CM | POA: Diagnosis not present

## 2021-02-20 DIAGNOSIS — N186 End stage renal disease: Secondary | ICD-10-CM | POA: Diagnosis not present

## 2021-02-20 DIAGNOSIS — Z992 Dependence on renal dialysis: Secondary | ICD-10-CM | POA: Diagnosis not present

## 2021-02-22 DIAGNOSIS — N186 End stage renal disease: Secondary | ICD-10-CM | POA: Diagnosis not present

## 2021-02-22 DIAGNOSIS — Z992 Dependence on renal dialysis: Secondary | ICD-10-CM | POA: Diagnosis not present

## 2021-02-22 DIAGNOSIS — N2581 Secondary hyperparathyroidism of renal origin: Secondary | ICD-10-CM | POA: Diagnosis not present

## 2021-02-24 DIAGNOSIS — Z992 Dependence on renal dialysis: Secondary | ICD-10-CM | POA: Diagnosis not present

## 2021-02-24 DIAGNOSIS — N186 End stage renal disease: Secondary | ICD-10-CM | POA: Diagnosis not present

## 2021-02-24 DIAGNOSIS — M3214 Glomerular disease in systemic lupus erythematosus: Secondary | ICD-10-CM | POA: Diagnosis not present

## 2021-02-25 DIAGNOSIS — N2581 Secondary hyperparathyroidism of renal origin: Secondary | ICD-10-CM | POA: Diagnosis not present

## 2021-02-25 DIAGNOSIS — N186 End stage renal disease: Secondary | ICD-10-CM | POA: Diagnosis not present

## 2021-02-25 DIAGNOSIS — Z992 Dependence on renal dialysis: Secondary | ICD-10-CM | POA: Diagnosis not present

## 2021-02-27 DIAGNOSIS — N186 End stage renal disease: Secondary | ICD-10-CM | POA: Diagnosis not present

## 2021-02-27 DIAGNOSIS — Z992 Dependence on renal dialysis: Secondary | ICD-10-CM | POA: Diagnosis not present

## 2021-02-27 DIAGNOSIS — N2581 Secondary hyperparathyroidism of renal origin: Secondary | ICD-10-CM | POA: Diagnosis not present

## 2021-03-01 DIAGNOSIS — Z992 Dependence on renal dialysis: Secondary | ICD-10-CM | POA: Diagnosis not present

## 2021-03-01 DIAGNOSIS — N186 End stage renal disease: Secondary | ICD-10-CM | POA: Diagnosis not present

## 2021-03-01 DIAGNOSIS — N2581 Secondary hyperparathyroidism of renal origin: Secondary | ICD-10-CM | POA: Diagnosis not present

## 2021-03-04 DIAGNOSIS — Z992 Dependence on renal dialysis: Secondary | ICD-10-CM | POA: Diagnosis not present

## 2021-03-04 DIAGNOSIS — N2581 Secondary hyperparathyroidism of renal origin: Secondary | ICD-10-CM | POA: Diagnosis not present

## 2021-03-04 DIAGNOSIS — N186 End stage renal disease: Secondary | ICD-10-CM | POA: Diagnosis not present

## 2021-03-06 ENCOUNTER — Other Ambulatory Visit (HOSPITAL_COMMUNITY): Payer: Self-pay | Admitting: Nephrology

## 2021-03-06 DIAGNOSIS — N186 End stage renal disease: Secondary | ICD-10-CM | POA: Diagnosis not present

## 2021-03-06 DIAGNOSIS — Z992 Dependence on renal dialysis: Secondary | ICD-10-CM | POA: Diagnosis not present

## 2021-03-06 DIAGNOSIS — I3139 Other pericardial effusion (noninflammatory): Secondary | ICD-10-CM

## 2021-03-06 DIAGNOSIS — N2581 Secondary hyperparathyroidism of renal origin: Secondary | ICD-10-CM | POA: Diagnosis not present

## 2021-03-08 DIAGNOSIS — N2581 Secondary hyperparathyroidism of renal origin: Secondary | ICD-10-CM | POA: Diagnosis not present

## 2021-03-08 DIAGNOSIS — N186 End stage renal disease: Secondary | ICD-10-CM | POA: Diagnosis not present

## 2021-03-08 DIAGNOSIS — Z992 Dependence on renal dialysis: Secondary | ICD-10-CM | POA: Diagnosis not present

## 2021-03-11 DIAGNOSIS — Z992 Dependence on renal dialysis: Secondary | ICD-10-CM | POA: Diagnosis not present

## 2021-03-11 DIAGNOSIS — N186 End stage renal disease: Secondary | ICD-10-CM | POA: Diagnosis not present

## 2021-03-11 DIAGNOSIS — N2581 Secondary hyperparathyroidism of renal origin: Secondary | ICD-10-CM | POA: Diagnosis not present

## 2021-03-13 DIAGNOSIS — N186 End stage renal disease: Secondary | ICD-10-CM | POA: Diagnosis not present

## 2021-03-13 DIAGNOSIS — Z992 Dependence on renal dialysis: Secondary | ICD-10-CM | POA: Diagnosis not present

## 2021-03-13 DIAGNOSIS — N2581 Secondary hyperparathyroidism of renal origin: Secondary | ICD-10-CM | POA: Diagnosis not present

## 2021-03-15 DIAGNOSIS — Z992 Dependence on renal dialysis: Secondary | ICD-10-CM | POA: Diagnosis not present

## 2021-03-15 DIAGNOSIS — N2581 Secondary hyperparathyroidism of renal origin: Secondary | ICD-10-CM | POA: Diagnosis not present

## 2021-03-15 DIAGNOSIS — N186 End stage renal disease: Secondary | ICD-10-CM | POA: Diagnosis not present

## 2021-03-17 DIAGNOSIS — Z992 Dependence on renal dialysis: Secondary | ICD-10-CM | POA: Diagnosis not present

## 2021-03-17 DIAGNOSIS — N186 End stage renal disease: Secondary | ICD-10-CM | POA: Diagnosis not present

## 2021-03-17 DIAGNOSIS — N2581 Secondary hyperparathyroidism of renal origin: Secondary | ICD-10-CM | POA: Diagnosis not present

## 2021-03-22 DIAGNOSIS — I158 Other secondary hypertension: Secondary | ICD-10-CM | POA: Diagnosis not present

## 2021-03-22 DIAGNOSIS — Z992 Dependence on renal dialysis: Secondary | ICD-10-CM | POA: Diagnosis not present

## 2021-03-22 DIAGNOSIS — N2581 Secondary hyperparathyroidism of renal origin: Secondary | ICD-10-CM | POA: Diagnosis not present

## 2021-03-22 DIAGNOSIS — N186 End stage renal disease: Secondary | ICD-10-CM | POA: Diagnosis not present

## 2021-03-26 DIAGNOSIS — Z992 Dependence on renal dialysis: Secondary | ICD-10-CM | POA: Diagnosis not present

## 2021-03-26 DIAGNOSIS — M3214 Glomerular disease in systemic lupus erythematosus: Secondary | ICD-10-CM | POA: Diagnosis not present

## 2021-03-26 DIAGNOSIS — N186 End stage renal disease: Secondary | ICD-10-CM | POA: Diagnosis not present

## 2021-03-27 DIAGNOSIS — Z992 Dependence on renal dialysis: Secondary | ICD-10-CM | POA: Diagnosis not present

## 2021-03-27 DIAGNOSIS — N186 End stage renal disease: Secondary | ICD-10-CM | POA: Diagnosis not present

## 2021-03-27 DIAGNOSIS — R110 Nausea: Secondary | ICD-10-CM | POA: Diagnosis not present

## 2021-03-27 DIAGNOSIS — N2581 Secondary hyperparathyroidism of renal origin: Secondary | ICD-10-CM | POA: Diagnosis not present

## 2021-03-29 DIAGNOSIS — Z992 Dependence on renal dialysis: Secondary | ICD-10-CM | POA: Diagnosis not present

## 2021-03-29 DIAGNOSIS — N186 End stage renal disease: Secondary | ICD-10-CM | POA: Diagnosis not present

## 2021-03-29 DIAGNOSIS — R110 Nausea: Secondary | ICD-10-CM | POA: Diagnosis not present

## 2021-03-29 DIAGNOSIS — N2581 Secondary hyperparathyroidism of renal origin: Secondary | ICD-10-CM | POA: Diagnosis not present

## 2021-04-01 DIAGNOSIS — Z992 Dependence on renal dialysis: Secondary | ICD-10-CM | POA: Diagnosis not present

## 2021-04-01 DIAGNOSIS — N186 End stage renal disease: Secondary | ICD-10-CM | POA: Diagnosis not present

## 2021-04-01 DIAGNOSIS — N2581 Secondary hyperparathyroidism of renal origin: Secondary | ICD-10-CM | POA: Diagnosis not present

## 2021-04-05 DIAGNOSIS — N2581 Secondary hyperparathyroidism of renal origin: Secondary | ICD-10-CM | POA: Diagnosis not present

## 2021-04-05 DIAGNOSIS — Z992 Dependence on renal dialysis: Secondary | ICD-10-CM | POA: Diagnosis not present

## 2021-04-05 DIAGNOSIS — N186 End stage renal disease: Secondary | ICD-10-CM | POA: Diagnosis not present

## 2021-04-10 DIAGNOSIS — Z992 Dependence on renal dialysis: Secondary | ICD-10-CM | POA: Diagnosis not present

## 2021-04-10 DIAGNOSIS — N186 End stage renal disease: Secondary | ICD-10-CM | POA: Diagnosis not present

## 2021-04-10 DIAGNOSIS — N2581 Secondary hyperparathyroidism of renal origin: Secondary | ICD-10-CM | POA: Diagnosis not present

## 2021-04-15 DIAGNOSIS — N2581 Secondary hyperparathyroidism of renal origin: Secondary | ICD-10-CM | POA: Diagnosis not present

## 2021-04-15 DIAGNOSIS — R110 Nausea: Secondary | ICD-10-CM | POA: Diagnosis not present

## 2021-04-15 DIAGNOSIS — Z992 Dependence on renal dialysis: Secondary | ICD-10-CM | POA: Diagnosis not present

## 2021-04-15 DIAGNOSIS — N186 End stage renal disease: Secondary | ICD-10-CM | POA: Diagnosis not present

## 2021-04-17 DIAGNOSIS — R110 Nausea: Secondary | ICD-10-CM | POA: Diagnosis not present

## 2021-04-17 DIAGNOSIS — N186 End stage renal disease: Secondary | ICD-10-CM | POA: Diagnosis not present

## 2021-04-17 DIAGNOSIS — N2581 Secondary hyperparathyroidism of renal origin: Secondary | ICD-10-CM | POA: Diagnosis not present

## 2021-04-17 DIAGNOSIS — Z992 Dependence on renal dialysis: Secondary | ICD-10-CM | POA: Diagnosis not present

## 2021-04-19 DIAGNOSIS — Z992 Dependence on renal dialysis: Secondary | ICD-10-CM | POA: Diagnosis not present

## 2021-04-19 DIAGNOSIS — R110 Nausea: Secondary | ICD-10-CM | POA: Diagnosis not present

## 2021-04-19 DIAGNOSIS — N2581 Secondary hyperparathyroidism of renal origin: Secondary | ICD-10-CM | POA: Diagnosis not present

## 2021-04-19 DIAGNOSIS — N186 End stage renal disease: Secondary | ICD-10-CM | POA: Diagnosis not present

## 2021-04-24 DIAGNOSIS — Z992 Dependence on renal dialysis: Secondary | ICD-10-CM | POA: Diagnosis not present

## 2021-04-24 DIAGNOSIS — N186 End stage renal disease: Secondary | ICD-10-CM | POA: Diagnosis not present

## 2021-04-24 DIAGNOSIS — N2581 Secondary hyperparathyroidism of renal origin: Secondary | ICD-10-CM | POA: Diagnosis not present

## 2021-04-25 ENCOUNTER — Ambulatory Visit (HOSPITAL_COMMUNITY)
Admission: RE | Admit: 2021-04-25 | Discharge: 2021-04-25 | Disposition: A | Discharge status: Home / Self Care | Payer: BC Managed Care – PPO | Source: Ambulatory Visit | Attending: Family Medicine | Admitting: Family Medicine

## 2021-04-25 DIAGNOSIS — M329 Systemic lupus erythematosus, unspecified: Secondary | ICD-10-CM | POA: Insufficient documentation

## 2021-04-25 DIAGNOSIS — I3139 Other pericardial effusion (noninflammatory): Secondary | ICD-10-CM | POA: Diagnosis not present

## 2021-04-25 DIAGNOSIS — I358 Other nonrheumatic aortic valve disorders: Secondary | ICD-10-CM | POA: Insufficient documentation

## 2021-04-25 DIAGNOSIS — R0602 Shortness of breath: Secondary | ICD-10-CM | POA: Insufficient documentation

## 2021-04-25 LAB — ECHOCARDIOGRAM COMPLETE
AR max vel: 3.29 cm2
AV Area VTI: 3.3 cm2
AV Area mean vel: 3.09 cm2
AV Mean grad: 3 mmHg
AV Peak grad: 5.5 mmHg
Ao pk vel: 1.17 m/s
Area-P 1/2: 2.91 cm2
MV VTI: 3.68 cm2
S' Lateral: 3.6 cm

## 2021-04-25 NOTE — Progress Notes (Signed)
°  Echocardiogram 2D Echocardiogram has been performed.  Marybelle Killings 04/25/2021, 1:31 PM

## 2021-04-26 DIAGNOSIS — Z992 Dependence on renal dialysis: Secondary | ICD-10-CM | POA: Diagnosis not present

## 2021-04-26 DIAGNOSIS — M3214 Glomerular disease in systemic lupus erythematosus: Secondary | ICD-10-CM | POA: Diagnosis not present

## 2021-04-26 DIAGNOSIS — N186 End stage renal disease: Secondary | ICD-10-CM | POA: Diagnosis not present

## 2021-04-29 DIAGNOSIS — N2581 Secondary hyperparathyroidism of renal origin: Secondary | ICD-10-CM | POA: Diagnosis not present

## 2021-04-29 DIAGNOSIS — N186 End stage renal disease: Secondary | ICD-10-CM | POA: Diagnosis not present

## 2021-04-29 DIAGNOSIS — Z992 Dependence on renal dialysis: Secondary | ICD-10-CM | POA: Diagnosis not present

## 2021-05-01 DIAGNOSIS — N2581 Secondary hyperparathyroidism of renal origin: Secondary | ICD-10-CM | POA: Diagnosis not present

## 2021-05-01 DIAGNOSIS — N186 End stage renal disease: Secondary | ICD-10-CM | POA: Diagnosis not present

## 2021-05-01 DIAGNOSIS — Z992 Dependence on renal dialysis: Secondary | ICD-10-CM | POA: Diagnosis not present

## 2021-05-06 DIAGNOSIS — Z992 Dependence on renal dialysis: Secondary | ICD-10-CM | POA: Diagnosis not present

## 2021-05-06 DIAGNOSIS — N186 End stage renal disease: Secondary | ICD-10-CM | POA: Diagnosis not present

## 2021-05-06 DIAGNOSIS — N2581 Secondary hyperparathyroidism of renal origin: Secondary | ICD-10-CM | POA: Diagnosis not present

## 2021-05-10 DIAGNOSIS — N186 End stage renal disease: Secondary | ICD-10-CM | POA: Diagnosis not present

## 2021-05-10 DIAGNOSIS — N2581 Secondary hyperparathyroidism of renal origin: Secondary | ICD-10-CM | POA: Diagnosis not present

## 2021-05-10 DIAGNOSIS — Z992 Dependence on renal dialysis: Secondary | ICD-10-CM | POA: Diagnosis not present

## 2021-05-13 DIAGNOSIS — N186 End stage renal disease: Secondary | ICD-10-CM | POA: Diagnosis not present

## 2021-05-13 DIAGNOSIS — N2581 Secondary hyperparathyroidism of renal origin: Secondary | ICD-10-CM | POA: Diagnosis not present

## 2021-05-13 DIAGNOSIS — Z992 Dependence on renal dialysis: Secondary | ICD-10-CM | POA: Diagnosis not present

## 2021-05-13 DIAGNOSIS — R110 Nausea: Secondary | ICD-10-CM | POA: Diagnosis not present

## 2021-05-17 DIAGNOSIS — Z992 Dependence on renal dialysis: Secondary | ICD-10-CM | POA: Diagnosis not present

## 2021-05-17 DIAGNOSIS — N2581 Secondary hyperparathyroidism of renal origin: Secondary | ICD-10-CM | POA: Diagnosis not present

## 2021-05-17 DIAGNOSIS — N186 End stage renal disease: Secondary | ICD-10-CM | POA: Diagnosis not present

## 2021-05-17 DIAGNOSIS — R110 Nausea: Secondary | ICD-10-CM | POA: Diagnosis not present

## 2021-05-20 DIAGNOSIS — Z992 Dependence on renal dialysis: Secondary | ICD-10-CM | POA: Diagnosis not present

## 2021-05-20 DIAGNOSIS — N2581 Secondary hyperparathyroidism of renal origin: Secondary | ICD-10-CM | POA: Diagnosis not present

## 2021-05-20 DIAGNOSIS — N186 End stage renal disease: Secondary | ICD-10-CM | POA: Diagnosis not present

## 2021-05-22 DIAGNOSIS — N186 End stage renal disease: Secondary | ICD-10-CM | POA: Diagnosis not present

## 2021-05-22 DIAGNOSIS — N2581 Secondary hyperparathyroidism of renal origin: Secondary | ICD-10-CM | POA: Diagnosis not present

## 2021-05-22 DIAGNOSIS — Z992 Dependence on renal dialysis: Secondary | ICD-10-CM | POA: Diagnosis not present

## 2021-05-24 DIAGNOSIS — N2581 Secondary hyperparathyroidism of renal origin: Secondary | ICD-10-CM | POA: Diagnosis not present

## 2021-05-24 DIAGNOSIS — Z992 Dependence on renal dialysis: Secondary | ICD-10-CM | POA: Diagnosis not present

## 2021-05-24 DIAGNOSIS — N186 End stage renal disease: Secondary | ICD-10-CM | POA: Diagnosis not present

## 2021-05-27 DIAGNOSIS — N186 End stage renal disease: Secondary | ICD-10-CM | POA: Diagnosis not present

## 2021-05-27 DIAGNOSIS — Z992 Dependence on renal dialysis: Secondary | ICD-10-CM | POA: Diagnosis not present

## 2021-05-27 DIAGNOSIS — M3214 Glomerular disease in systemic lupus erythematosus: Secondary | ICD-10-CM | POA: Diagnosis not present

## 2021-05-27 DIAGNOSIS — N2581 Secondary hyperparathyroidism of renal origin: Secondary | ICD-10-CM | POA: Diagnosis not present

## 2021-05-31 DIAGNOSIS — N186 End stage renal disease: Secondary | ICD-10-CM | POA: Diagnosis not present

## 2021-05-31 DIAGNOSIS — N2581 Secondary hyperparathyroidism of renal origin: Secondary | ICD-10-CM | POA: Diagnosis not present

## 2021-05-31 DIAGNOSIS — Z992 Dependence on renal dialysis: Secondary | ICD-10-CM | POA: Diagnosis not present

## 2021-06-03 DIAGNOSIS — N186 End stage renal disease: Secondary | ICD-10-CM | POA: Diagnosis not present

## 2021-06-03 DIAGNOSIS — Z992 Dependence on renal dialysis: Secondary | ICD-10-CM | POA: Diagnosis not present

## 2021-06-03 DIAGNOSIS — N2581 Secondary hyperparathyroidism of renal origin: Secondary | ICD-10-CM | POA: Diagnosis not present

## 2021-06-05 DIAGNOSIS — Z992 Dependence on renal dialysis: Secondary | ICD-10-CM | POA: Diagnosis not present

## 2021-06-05 DIAGNOSIS — N186 End stage renal disease: Secondary | ICD-10-CM | POA: Diagnosis not present

## 2021-06-05 DIAGNOSIS — N2581 Secondary hyperparathyroidism of renal origin: Secondary | ICD-10-CM | POA: Diagnosis not present

## 2021-06-07 DIAGNOSIS — N186 End stage renal disease: Secondary | ICD-10-CM | POA: Diagnosis not present

## 2021-06-07 DIAGNOSIS — Z992 Dependence on renal dialysis: Secondary | ICD-10-CM | POA: Diagnosis not present

## 2021-06-07 DIAGNOSIS — N2581 Secondary hyperparathyroidism of renal origin: Secondary | ICD-10-CM | POA: Diagnosis not present

## 2021-06-10 DIAGNOSIS — N2581 Secondary hyperparathyroidism of renal origin: Secondary | ICD-10-CM | POA: Diagnosis not present

## 2021-06-10 DIAGNOSIS — Z992 Dependence on renal dialysis: Secondary | ICD-10-CM | POA: Diagnosis not present

## 2021-06-10 DIAGNOSIS — N186 End stage renal disease: Secondary | ICD-10-CM | POA: Diagnosis not present

## 2021-06-11 ENCOUNTER — Ambulatory Visit (HOSPITAL_COMMUNITY)
Admission: RE | Admit: 2021-06-11 | Discharge: 2021-06-11 | Disposition: A | Discharge status: Home / Self Care | Payer: BC Managed Care – PPO | Source: Ambulatory Visit | Attending: Nephrology | Admitting: Nephrology

## 2021-06-11 ENCOUNTER — Other Ambulatory Visit: Payer: Self-pay

## 2021-06-11 DIAGNOSIS — R188 Other ascites: Secondary | ICD-10-CM

## 2021-06-11 HISTORY — PX: IR PARACENTESIS: IMG2679

## 2021-06-11 MED ORDER — LIDOCAINE HCL 1 % IJ SOLN
INTRAMUSCULAR | Status: AC
  Administered 2021-06-11: 10 mL
  Filled 2021-06-11: qty 20

## 2021-06-11 NOTE — Procedures (Signed)
Ultrasound-guided therapeutic paracentesis performed yielding 1 liter of yellow fluid. No immediate complications. EBL none.

## 2021-06-12 DIAGNOSIS — Z992 Dependence on renal dialysis: Secondary | ICD-10-CM | POA: Diagnosis not present

## 2021-06-12 DIAGNOSIS — N2581 Secondary hyperparathyroidism of renal origin: Secondary | ICD-10-CM | POA: Diagnosis not present

## 2021-06-12 DIAGNOSIS — N186 End stage renal disease: Secondary | ICD-10-CM | POA: Diagnosis not present

## 2021-06-17 DIAGNOSIS — N2581 Secondary hyperparathyroidism of renal origin: Secondary | ICD-10-CM | POA: Diagnosis not present

## 2021-06-17 DIAGNOSIS — Z992 Dependence on renal dialysis: Secondary | ICD-10-CM | POA: Diagnosis not present

## 2021-06-17 DIAGNOSIS — N186 End stage renal disease: Secondary | ICD-10-CM | POA: Diagnosis not present

## 2021-06-19 DIAGNOSIS — N186 End stage renal disease: Secondary | ICD-10-CM | POA: Diagnosis not present

## 2021-06-19 DIAGNOSIS — N2581 Secondary hyperparathyroidism of renal origin: Secondary | ICD-10-CM | POA: Diagnosis not present

## 2021-06-19 DIAGNOSIS — Z992 Dependence on renal dialysis: Secondary | ICD-10-CM | POA: Diagnosis not present

## 2021-06-21 DIAGNOSIS — Z992 Dependence on renal dialysis: Secondary | ICD-10-CM | POA: Diagnosis not present

## 2021-06-21 DIAGNOSIS — N186 End stage renal disease: Secondary | ICD-10-CM | POA: Diagnosis not present

## 2021-06-21 DIAGNOSIS — N2581 Secondary hyperparathyroidism of renal origin: Secondary | ICD-10-CM | POA: Diagnosis not present

## 2021-06-24 DIAGNOSIS — Z992 Dependence on renal dialysis: Secondary | ICD-10-CM | POA: Diagnosis not present

## 2021-06-24 DIAGNOSIS — N186 End stage renal disease: Secondary | ICD-10-CM | POA: Diagnosis not present

## 2021-06-24 DIAGNOSIS — M3214 Glomerular disease in systemic lupus erythematosus: Secondary | ICD-10-CM | POA: Diagnosis not present

## 2021-06-26 DIAGNOSIS — Z992 Dependence on renal dialysis: Secondary | ICD-10-CM | POA: Diagnosis not present

## 2021-06-26 DIAGNOSIS — N2581 Secondary hyperparathyroidism of renal origin: Secondary | ICD-10-CM | POA: Diagnosis not present

## 2021-06-26 DIAGNOSIS — N186 End stage renal disease: Secondary | ICD-10-CM | POA: Diagnosis not present

## 2021-06-28 ENCOUNTER — Emergency Department (HOSPITAL_COMMUNITY)
Admission: EM | Admit: 2021-06-28 | Discharge: 2021-06-29 | Disposition: A | Discharge status: Home / Self Care | Payer: BC Managed Care – PPO | Attending: Student | Admitting: Student

## 2021-06-28 ENCOUNTER — Other Ambulatory Visit: Payer: Self-pay

## 2021-06-28 ENCOUNTER — Encounter (HOSPITAL_COMMUNITY): Payer: Self-pay

## 2021-06-28 DIAGNOSIS — I2584 Coronary atherosclerosis due to calcified coronary lesion: Secondary | ICD-10-CM | POA: Diagnosis not present

## 2021-06-28 DIAGNOSIS — Z992 Dependence on renal dialysis: Secondary | ICD-10-CM | POA: Insufficient documentation

## 2021-06-28 DIAGNOSIS — K5900 Constipation, unspecified: Secondary | ICD-10-CM | POA: Insufficient documentation

## 2021-06-28 DIAGNOSIS — N186 End stage renal disease: Secondary | ICD-10-CM | POA: Diagnosis not present

## 2021-06-28 DIAGNOSIS — R590 Localized enlarged lymph nodes: Secondary | ICD-10-CM | POA: Diagnosis not present

## 2021-06-28 DIAGNOSIS — E878 Other disorders of electrolyte and fluid balance, not elsewhere classified: Secondary | ICD-10-CM | POA: Insufficient documentation

## 2021-06-28 DIAGNOSIS — D72819 Decreased white blood cell count, unspecified: Secondary | ICD-10-CM | POA: Diagnosis not present

## 2021-06-28 DIAGNOSIS — U071 COVID-19: Secondary | ICD-10-CM | POA: Insufficient documentation

## 2021-06-28 DIAGNOSIS — R1084 Generalized abdominal pain: Secondary | ICD-10-CM | POA: Diagnosis not present

## 2021-06-28 DIAGNOSIS — R000 Tachycardia, unspecified: Secondary | ICD-10-CM | POA: Diagnosis not present

## 2021-06-28 DIAGNOSIS — D649 Anemia, unspecified: Secondary | ICD-10-CM | POA: Diagnosis not present

## 2021-06-28 DIAGNOSIS — R188 Other ascites: Secondary | ICD-10-CM | POA: Diagnosis not present

## 2021-06-28 DIAGNOSIS — K5730 Diverticulosis of large intestine without perforation or abscess without bleeding: Secondary | ICD-10-CM | POA: Diagnosis not present

## 2021-06-28 DIAGNOSIS — N2581 Secondary hyperparathyroidism of renal origin: Secondary | ICD-10-CM | POA: Diagnosis not present

## 2021-06-28 DIAGNOSIS — I132 Hypertensive heart and chronic kidney disease with heart failure and with stage 5 chronic kidney disease, or end stage renal disease: Secondary | ICD-10-CM | POA: Diagnosis not present

## 2021-06-28 DIAGNOSIS — R161 Splenomegaly, not elsewhere classified: Secondary | ICD-10-CM | POA: Diagnosis not present

## 2021-06-28 DIAGNOSIS — I509 Heart failure, unspecified: Secondary | ICD-10-CM | POA: Insufficient documentation

## 2021-06-28 DIAGNOSIS — E876 Hypokalemia: Secondary | ICD-10-CM | POA: Diagnosis not present

## 2021-06-28 DIAGNOSIS — R0781 Pleurodynia: Secondary | ICD-10-CM | POA: Diagnosis not present

## 2021-06-28 DIAGNOSIS — R0789 Other chest pain: Secondary | ICD-10-CM | POA: Diagnosis not present

## 2021-06-28 DIAGNOSIS — E039 Hypothyroidism, unspecified: Secondary | ICD-10-CM | POA: Diagnosis not present

## 2021-06-28 DIAGNOSIS — R109 Unspecified abdominal pain: Secondary | ICD-10-CM | POA: Diagnosis not present

## 2021-06-28 DIAGNOSIS — I700 Atherosclerosis of aorta: Secondary | ICD-10-CM | POA: Diagnosis not present

## 2021-06-28 DIAGNOSIS — R079 Chest pain, unspecified: Secondary | ICD-10-CM | POA: Diagnosis not present

## 2021-06-28 DIAGNOSIS — J189 Pneumonia, unspecified organism: Secondary | ICD-10-CM | POA: Diagnosis not present

## 2021-06-28 DIAGNOSIS — J90 Pleural effusion, not elsewhere classified: Secondary | ICD-10-CM | POA: Diagnosis not present

## 2021-06-28 DIAGNOSIS — J9811 Atelectasis: Secondary | ICD-10-CM | POA: Diagnosis not present

## 2021-06-28 MED ORDER — ACETAMINOPHEN 500 MG PO TABS
1000.0000 mg | ORAL_TABLET | Freq: Once | ORAL | Status: AC
Start: 2021-06-28 — End: 2021-06-28
  Administered 2021-06-28: 1000 mg via ORAL
  Filled 2021-06-28: qty 2

## 2021-06-28 MED ORDER — LACTATED RINGERS IV BOLUS
1000.0000 mL | Freq: Once | INTRAVENOUS | Status: AC
  Administered 2021-06-28: 1000 mL via INTRAVENOUS

## 2021-06-28 MED ORDER — MORPHINE SULFATE (PF) 2 MG/ML IV SOLN
2.0000 mg | Freq: Once | INTRAVENOUS | Status: AC
  Administered 2021-06-28: 2 mg via INTRAVENOUS
  Filled 2021-06-28: qty 1

## 2021-06-28 NOTE — ED Triage Notes (Signed)
Pt BIB [facility] from home with abdominal pain and no bowel movement in at least a week. H/x SBO. No relief with laxatives at home. Pt also concerned that he might have pneumonia because he has a cough. Dialysis today. L sided fistula ?160/100 ?HR 110 ?99% RA ?122 CBG ?

## 2021-06-28 NOTE — ED Provider Notes (Signed)
[facility] EMERGENCY DEPARTMENT Provider Note  CSN: [id number] Arrival date & time: 06/28/21 2309  Chief Complaint(s) Abdominal Pain and Cough  HPI DOYEL MULKERN is a 51 y.o. male with PMH ESRD on hemodialysis Tuesday Thursday Saturday secondary to lupus nephritis, CHF with EF 55 to 60% who presents emergency department for evaluation of abdominal pain, chest pain, shortness of breath.  Patient arrives febrile to 101.5, tachycardic to 113 and tachypneic to 24 saturating 92% on room air.  He states that he has not had a bowel movement and the last 10 days.  He states that his chest pain is primarily pleuritic and he feels that he cannot take a deep breath.  He has been compliant with his dialysis and went today.  He also endorses pelvic lymphadenopathy on the right.  Denies dysuria, increased frequency, penile rash, penile discharge.   Abdominal Pain Associated symptoms: chest pain, constipation, cough and shortness of breath   Cough Associated symptoms: chest pain and shortness of breath    Past Medical History Past Medical History:  Diagnosis Date   Chronic kidney disease    Lupus ([place])    Thyroid disease    Patient Active Problem List   Diagnosis Date Noted   Ascites 10/01/2020   Dependence on renal dialysis ([place]) 10/01/2020   Disorder of kidney and ureter, unspecified 10/01/2020   Essential hypertension 10/01/2020   Inflammatory arthritis 10/01/2020   Lupus nephritis ([place]) 10/01/2020   Thrombocytopenia ([place]) 10/01/2020   Stage 5 chronic kidney disease on chronic dialysis ([place]) 07/03/2020   Weight loss 07/03/2020   Malnutrition of moderate degree 05/01/2020   Acute heart failure with preserved ejection fraction (HFpEF) ([place]) 04/27/2020   Stage 3a chronic kidney disease ([place]) 01/15/2020   Acquired hypothyroidism 11/27/2019   Cardiac murmur 11/24/2019   Congestive heart failure ([place]) 11/06/2019   Elevated TSH 11/06/2019   Folate deficiency 04/11/2019    B12 deficiency 04/11/2019   Pancytopenia ([place]) 02/06/2019   Alcohol use 02/06/2019   Elevated LFTs 02/06/2019   Systemic lupus erythematosus with lung involvement ([place]) 12/05/2018   Vitamin D deficiency 12/05/2018   False positive interferon-gamma release assay (IGRA) for tuberculosis 12/05/2018   Neurotic excoriations 11/01/2018   Encounter for health maintenance examination with abnormal findings 11/01/2018   Polyarthralgia 06/11/2015   Pleural effusion    S/P thoracentesis    Lupus ([place]) 03/10/2015   Dyspnea 03/10/2015   Lupus (systemic lupus erythematosus) ([place]) 03/09/2015   Home Medication(s) Prior to Admission medications   Medication Sig Start Date End Date Taking? Authorizing Provider  acetaminophen (TYLENOL) 325 MG tablet Take 2 tablets (650 mg total) by mouth every 4 (four) hours as needed for headache or mild pain. 05/11/20   Blain Pais, MD  famotidine (PEPCID) 20 MG tablet Take 1 tablet (20 mg total) by mouth daily. Patient not taking: Reported on 08/21/2020 05/11/20 07/10/20  Blain Pais, MD  folic acid (FOLVITE) 1 MG tablet Take 1 tablet by mouth once daily Patient not taking: No sig reported 01/16/20   [name], MD  lactulose [facility]) 10 GM/15ML solution Take 30 mLs (20 g total) by mouth 2 (two) times daily as needed for moderate constipation. y. Patient not taking: No sig reported 05/11/20   Blain Pais, MD  levothyroxine (SYNTHROID) 50 MCG tablet Take 1 tablet (50 mcg total) by mouth daily before breakfast. Patient not taking: No sig reported 07/02/20   [name], MD  mycophenolate (CELLCEPT) 250 MG  capsule Take 250 mg by mouth daily. Patient not taking: Reported on 10/02/2020    [provider]  Nutritional Supplements (,FEEDING SUPPLEMENT, PROSOURCE PLUS) liquid Take 30 mLs by mouth 3 (three) times daily between meals. Patient not taking: No sig reported 05/11/20   Blain Pais, MD  Nutritional  Supplements (FEEDING SUPPLEMENT, NEPRO CARB STEADY,) LIQD Take 237 mLs by mouth daily. Patient not taking: No sig reported 05/11/20   Blain Pais, MD  oxyCODONE-acetaminophen (PERCOCET) 5-325 MG tablet Take 1 tablet by mouth every 6 (six) hours as needed for severe pain. Patient not taking: Reported on 10/02/2020 08/26/20   Gabriel Earing, PA-C  predniSONE (DELTASONE) 10 MG tablet Take 10 mg by mouth daily. Patient not taking: Reported on 10/02/2020 07/23/20   [provider]  vitamin B-12 (CYANOCOBALAMIN) 1000 MCG tablet Take 1,000 mcg by mouth daily. 04/09/20   [provider]  Vitamin D, Ergocalciferol, (DRISDOL) 1.25 MG (50000 UNIT) CAPS capsule Take 1 capsule (50,000 Units total) by mouth every 7 (seven) days. 07/03/20   [name], MD                                                                                                                                    Past Surgical History Past Surgical History:  Procedure Laterality Date   AV FISTULA PLACEMENT Left 08/26/2020   Procedure: LEFT RADIOCEPHALIC ARTERIOVENOUS (AV) FISTULA CREATION;  Surgeon: Angelia Mould, MD;  Location: [place];  Service: Vascular;  Laterality: Left;   IR FLUORO GUIDE CV LINE RIGHT  04/30/2020   IR PARACENTESIS  05/07/2020   IR PARACENTESIS  05/10/2020   IR PARACENTESIS  06/03/2020   IR PARACENTESIS  06/24/2020   IR PARACENTESIS  07/15/2020   IR PARACENTESIS  07/29/2020   IR PARACENTESIS  08/14/2020   IR PARACENTESIS  06/11/2021   IR PERC TUN PERIT CATH WO PORT S&I /IMAG  05/03/2020   IR US GUIDE VASC ACCESS RIGHT  04/30/2020   IR US GUIDE VASC ACCESS RIGHT  05/03/2020   Family History Family History  Problem Relation Age of Onset   Cancer Mother     Social History Social History   Tobacco Use   Smoking status: Never   Smokeless tobacco: Never  Vaping Use   Vaping Use: Never used  Substance Use Topics   Alcohol use: Yes    Comment: 2-4 glasses of wine 2-3 times weekly    Drug use: No   Allergies Patient has no known allergies.  Review of Systems Review of Systems  Respiratory:  Positive for cough and shortness of breath.   Cardiovascular:  Positive for chest pain.  Gastrointestinal:  Positive for abdominal pain and constipation.   Physical Exam Vital Signs  I have reviewed the triage vital signs BP (!) 165/98    Pulse (!) 113    Temp (!) 101.5 F (38.6 C) (Oral)  Resp (!) 24    Ht 5\' 11"  (1.803 m)    Wt 74.8 kg    SpO2 92%    BMI 23.01 kg/m   Physical Exam Vitals and nursing note reviewed.  Constitutional:      General: He is not in acute distress.    Appearance: He is well-developed.  HENT:     Head: Normocephalic and atraumatic.  Eyes:     Conjunctiva/sclera: Conjunctivae normal.  Cardiovascular:     Rate and Rhythm: Normal rate and regular rhythm.     Heart sounds: No murmur heard. Pulmonary:     Effort: Pulmonary effort is normal. No respiratory distress.     Breath sounds: Normal breath sounds.  Abdominal:     General: There is distension.     Palpations: Abdomen is soft.     Tenderness: There is generalized abdominal tenderness.  Musculoskeletal:        General: No swelling.     Cervical back: Neck supple.  Skin:    General: Skin is warm and dry.     Capillary Refill: Capillary refill takes less than 2 seconds.  Neurological:     Mental Status: He is alert.  Psychiatric:        Mood and Affect: Mood normal.    ED Results and Treatments Labs (all labs ordered are listed, but only abnormal results are displayed) Labs Reviewed  CULTURE, BLOOD (ROUTINE X 2)  CULTURE, BLOOD (ROUTINE X 2)  CBC WITH DIFFERENTIAL/PLATELET  COMPREHENSIVE METABOLIC PANEL  LIPASE, BLOOD  URINALYSIS, ROUTINE W REFLEX MICROSCOPIC  LACTIC ACID, PLASMA  LACTIC ACID, PLASMA  PROTIME-INR                                                                                                                          Radiology No results  found.  Pertinent labs & imaging results that were available during my care of the patient were reviewed by me and considered in my medical decision making (see MDM for details).  Medications Ordered in ED Medications  lactated ringers bolus 1,000 mL (has no administration in time range)                                                                                                                                     Procedures .Critical Care Performed by: Teressa Lower, MD Authorized by: Teressa Lower, MD  Critical care provider statement:    Critical care time (minutes):  30   Critical care was necessary to treat or prevent imminent or life-threatening deterioration of the following conditions:  Sepsis   Critical care was time spent personally by me on the following activities:  Development of treatment plan with patient or surrogate, discussions with consultants, evaluation of patient's response to treatment, examination of patient, ordering and review of laboratory studies, ordering and review of radiographic studies, ordering and performing treatments and interventions, pulse oximetry, re-evaluation of patient's condition and review of old charts ABDOMINAL PARACENTESIS  Date/Time: 06/29/2021 5:58 AM Performed by: Teressa Lower, MD Authorized by: Teressa Lower, MD  Consent: Verbal consent obtained. Written consent obtained. Risks and benefits: risks, benefits and alternatives were discussed Preparation: Patient was prepped and draped in the usual sterile fashion. Local anesthesia used: yes Anesthesia: local infiltration  Anesthesia: Local anesthesia used: yes Local Anesthetic: lidocaine 1% without epinephrine Anesthetic total: 10 mL  Sedation: Patient sedated: no  Patient tolerance: patient tolerated the procedure well with no immediate complications Comments: Paracentesis unsuccessful    (including critical care time)  Medical Decision Making / ED  Course   This patient presents to the ED for concern of abdominal pain, early satiety, fever, this involves an extensive number of treatment options, and is a complaint that carries with it a high risk of complications and morbidity.  The differential diagnosis includes obstruction, intra-abdominal infection, ascites, COVID-19, influenza, urinary tract infection  MDM: Patient seen emergency department for evaluation of abdominal pain, abdominal bloating and fever.  Physical exam reveals generalized abdominal tenderness to palpation, otherwise unremarkable.  Laboratory evaluation with leukopenia 2.0 which is chronic for this patient, hemoglobin 9.2 also chronic, potassium 3.1, chloride 98, creatinine 4.51, lactate normal at 1.3.  As patient initially tachycardic, febrile with leukopenia he meets SIRS criteria and started on broad-spectrum antibiotics.  However, with completion of work-up, his urinalysis is not consistent with urinary tract infection and he is COVID-positive which is likely the source of his fever.  CT abdomen pelvis with contrast, CT PE obtained which are all reassuringly negative for infection.  His early satiety is likely due to his ascites and he states that the size of his abdomen has doubled this week.  I attempted to do a bedside paracentesis but was unsuccessful, and the patient will receive a therapeutic paracentesis tomorrow morning and then be discharged home.  Ultrasound paracentesis orders were placed and patient signed out to oncoming provider.  Please see provider signout for continuation of work-up.   Additional history obtained:  -External records from outside source obtained and reviewed including: Chart review including previous notes, labs, imaging, consultation notes   Lab Tests: -I ordered, reviewed, and interpreted labs.   The pertinent results include:   Labs Reviewed  CULTURE, BLOOD (ROUTINE X 2)  CULTURE, BLOOD (ROUTINE X 2)  CBC WITH DIFFERENTIAL/PLATELET   COMPREHENSIVE METABOLIC PANEL  LIPASE, BLOOD  URINALYSIS, ROUTINE W REFLEX MICROSCOPIC  LACTIC ACID, PLASMA  LACTIC ACID, PLASMA  PROTIME-INR     EKG   EKG Interpretation  Date/Time:  Saturday June 28 2021 23:16:19 EST Ventricular Rate:  115 PR Interval:  152 QRS Duration: 85 QT Interval:  365 QTC Calculation: 505 R Axis:   -39 Text Interpretation: Sinus tachycardia Prolonged QT interval Confirmed by Josearmando Kuhnert ([id number]) on 06/29/2021 12:35:18 AM         Imaging Studies ordered: I ordered imaging studies including chest x-ray, CT abdomen pelvis, CT PE,  KUB I independently visualized and interpreted imaging. I agree with the radiologist interpretation   Medicines ordered and prescription drug management: Meds ordered this encounter  Medications   lactated ringers bolus 1,000 mL    -I have reviewed the patients home medicines and have made adjustments as needed  Critical interventions Multiple antibiotics, sepsis treatments  Cardiac Monitoring: The patient was maintained on a cardiac monitor.  I personally viewed and interpreted the cardiac monitored which showed an underlying rhythm of: NSR, sinus tachycardia   Social Determinants of Health:  Factors impacting patients care include: none   Reevaluation: After the interventions noted above, I reevaluated the patient and found that they have :improved  Co morbidities that complicate the patient evaluation  Past Medical History:  Diagnosis Date   Chronic kidney disease    Lupus ([place])    Thyroid disease       Dispostion: I considered admission for this patient, but patient's presentation consistent with COVID and reaccumulation of his ascites.  Anticipate discharge home after ultrasound-guided paracentesis today.     Final Clinical Impression(s) / ED Diagnoses Final diagnoses:  None     @PCDICTATION @    Tyberius Ryner, Debe Coder, MD 06/29/21 [id number]

## 2021-06-29 ENCOUNTER — Emergency Department (HOSPITAL_COMMUNITY): Payer: BC Managed Care – PPO

## 2021-06-29 DIAGNOSIS — I2584 Coronary atherosclerosis due to calcified coronary lesion: Secondary | ICD-10-CM | POA: Diagnosis not present

## 2021-06-29 DIAGNOSIS — J9811 Atelectasis: Secondary | ICD-10-CM | POA: Diagnosis not present

## 2021-06-29 DIAGNOSIS — R188 Other ascites: Secondary | ICD-10-CM | POA: Diagnosis not present

## 2021-06-29 DIAGNOSIS — R109 Unspecified abdominal pain: Secondary | ICD-10-CM | POA: Diagnosis not present

## 2021-06-29 DIAGNOSIS — I700 Atherosclerosis of aorta: Secondary | ICD-10-CM | POA: Diagnosis not present

## 2021-06-29 DIAGNOSIS — J90 Pleural effusion, not elsewhere classified: Secondary | ICD-10-CM | POA: Diagnosis not present

## 2021-06-29 DIAGNOSIS — K5730 Diverticulosis of large intestine without perforation or abscess without bleeding: Secondary | ICD-10-CM | POA: Diagnosis not present

## 2021-06-29 DIAGNOSIS — R161 Splenomegaly, not elsewhere classified: Secondary | ICD-10-CM | POA: Diagnosis not present

## 2021-06-29 LAB — COMPREHENSIVE METABOLIC PANEL
ALT: 18 U/L (ref 0–44)
AST: 34 U/L (ref 15–41)
Albumin: 2.3 g/dL — ABNORMAL LOW (ref 3.5–5.0)
Alkaline Phosphatase: 57 U/L (ref 38–126)
Anion gap: 8 (ref 5–15)
BUN: 6 mg/dL (ref 6–20)
CO2: 34 mmol/L — ABNORMAL HIGH (ref 22–32)
Calcium: 6.9 mg/dL — ABNORMAL LOW (ref 8.9–10.3)
Chloride: 90 mmol/L — ABNORMAL LOW (ref 98–111)
Creatinine, Ser: 4.51 mg/dL — ABNORMAL HIGH (ref 0.61–1.24)
GFR, Estimated: 15 mL/min — ABNORMAL LOW (ref >60)
Glucose, Bld: 91 mg/dL (ref 70–99)
Potassium: 3.1 mmol/L — ABNORMAL LOW (ref 3.5–5.1)
Sodium: 132 mmol/L — ABNORMAL LOW (ref 135–145)
Total Bilirubin: 0.6 mg/dL (ref 0.3–1.2)
Total Protein: 7 g/dL (ref 6.5–8.1)

## 2021-06-29 LAB — URINALYSIS, ROUTINE W REFLEX MICROSCOPIC
Bilirubin Urine: NEGATIVE
Glucose, UA: NEGATIVE mg/dL
Ketones, ur: 5 mg/dL — AB
Leukocytes,Ua: NEGATIVE
Nitrite: NEGATIVE
Protein, ur: >=300 mg/dL — AB
Specific Gravity, Urine: 1.018 (ref 1.005–1.030)
pH: 7 (ref 5.0–8.0)

## 2021-06-29 LAB — CBC WITH DIFFERENTIAL/PLATELET
Abs Immature Granulocytes: 0.04 10*3/uL (ref 0.00–0.07)
Basophils Absolute: 0 10*3/uL (ref 0.0–0.1)
Basophils Relative: 1 %
Eosinophils Absolute: 0 10*3/uL (ref 0.0–0.5)
Eosinophils Relative: 1 %
HCT: 29.1 % — ABNORMAL LOW (ref 39.0–52.0)
Hemoglobin: 9.2 g/dL — ABNORMAL LOW (ref 13.0–17.0)
Immature Granulocytes: 2 %
Lymphocytes Relative: 24 %
Lymphs Abs: 0.5 10*3/uL — ABNORMAL LOW (ref 0.7–4.0)
MCH: 29.6 pg (ref 26.0–34.0)
MCHC: 31.6 g/dL (ref 30.0–36.0)
MCV: 93.6 fL (ref 80.0–100.0)
Monocytes Absolute: 0.1 10*3/uL (ref 0.1–1.0)
Monocytes Relative: 5 %
Neutro Abs: 1.3 10*3/uL — ABNORMAL LOW (ref 1.7–7.7)
Neutrophils Relative %: 67 %
Platelets: 98 10*3/uL — ABNORMAL LOW (ref 150–400)
RBC: 3.11 MIL/uL — ABNORMAL LOW (ref 4.22–5.81)
RDW: 14.6 % (ref 11.5–15.5)
WBC: 2 10*3/uL — ABNORMAL LOW (ref 4.0–10.5)
nRBC: 0 % (ref 0.0–0.2)

## 2021-06-29 LAB — PROTIME-INR
INR: 1.1 (ref 0.8–1.2)
Prothrombin Time: 14.5 seconds (ref 11.4–15.2)

## 2021-06-29 LAB — RESP PANEL BY RT-PCR (FLU A&B, COVID) ARPGX2
Influenza A by PCR: NEGATIVE
Influenza B by PCR: NEGATIVE
SARS Coronavirus 2 by RT PCR: POSITIVE — AB

## 2021-06-29 LAB — LACTIC ACID, PLASMA: Lactic Acid, Venous: 1.3 mmol/L (ref 0.5–1.9)

## 2021-06-29 LAB — LIPASE, BLOOD: Lipase: 33 U/L (ref 11–51)

## 2021-06-29 MED ORDER — SODIUM CHLORIDE 0.9 % IV SOLN: 2.0000 g | INTRAVENOUS | Status: DC

## 2021-06-29 MED ORDER — LIDOCAINE HCL (PF) 1 % IJ SOLN
INTRAMUSCULAR | Status: AC
  Administered 2021-06-29: 5 mL
  Filled 2021-06-29: qty 5

## 2021-06-29 MED ORDER — MORPHINE SULFATE (PF) 2 MG/ML IV SOLN
2.0000 mg | Freq: Once | INTRAVENOUS | Status: AC
  Administered 2021-06-29: 2 mg via INTRAVENOUS
  Filled 2021-06-29: qty 1

## 2021-06-29 MED ORDER — LIDOCAINE HCL (PF) 1 % IJ SOLN: 5.0000 mL | Freq: Once | INTRAMUSCULAR | Status: AC

## 2021-06-29 MED ORDER — DOCUSATE SODIUM 100 MG PO CAPS: 100.0000 mg | ORAL_CAPSULE | Freq: Two times a day (BID) | ORAL | 0 refills | Status: DC | PRN

## 2021-06-29 MED ORDER — SODIUM CHLORIDE 0.9 % IV SOLN
2.0000 g | Freq: Once | INTRAVENOUS | Status: AC
  Administered 2021-06-29: 2 g via INTRAVENOUS
  Filled 2021-06-29: qty 2

## 2021-06-29 MED ORDER — IOHEXOL 350 MG/ML SOLN
100.0000 mL | Freq: Once | INTRAVENOUS | Status: AC | PRN
  Administered 2021-06-29: 100 mL via INTRAVENOUS

## 2021-06-29 MED ORDER — VANCOMYCIN HCL 1750 MG/350ML IV SOLN
1750.0000 mg | Freq: Once | INTRAVENOUS | Status: AC
  Administered 2021-06-29: 1750 mg via INTRAVENOUS
  Filled 2021-06-29 (×2): qty 350

## 2021-06-29 MED ORDER — VANCOMYCIN HCL 750 MG/150ML IV SOLN: 750.0000 mg | INTRAVENOUS | Status: DC

## 2021-06-29 NOTE — ED Provider Notes (Signed)
?  Physical Exam  ?BP (!) 141/94   Pulse (!) 104   Temp 98.7 ?F (37.1 ?C) (Oral)   Resp (!) 23   Ht 5\' 11"  (1.803 m)   Wt 74.8 kg   SpO2 95%   BMI 23.01 kg/m?  ? ?Physical Exam ? ?Procedures  ?Procedures ? ?ED Course / MDM  ?  ?Medical Decision Making ?Amount and/or Complexity of Data Reviewed ?Labs: ordered. ?Radiology: ordered. ? ?Risk ?OTC drugs. ?Prescription drug management. ? ? ?Fever Lupus hemodialysis patient.  Fever thought to be secondary to his positive COVID test.  However more abdominal distention.  Ultrasound paracentesis been ordered by last provider. ? ?Interventional radiology unable to find fluid for ultrasound guidance.  Feeling somewhat better.  Patient states he has had some constipation to.  We will treat symptomatically for that.  Has COVID.  Has had symptoms for weeks and on antiviral candidate.  Will discharge home. ? ? ? ? ?  ?Davonna Belling, MD ?06/29/21 1526 ? ?

## 2021-06-29 NOTE — ED Notes (Signed)
Pt is being very unpleasant this morning. Pt called out for pain medicine, pt did not have anything ordered and asked MD for pain medicine. Pt also stated he was starving. RN brought [place] sandwich and pain medicine to pt. Pt started throwing sandwich bag and crust against wall. RN left room since pt was throwing food. Transport is here to take pt to IR. Pt states he will go when he finishes sandwich.  ?

## 2021-06-29 NOTE — Progress Notes (Signed)
? ?  US guided paracentesis ordered ? ?Limited Abd performed ?No ascites noted on US---no pocket seen ?No paracentesis performed ? ?Pt sent back to room ?

## 2021-06-29 NOTE — Progress Notes (Signed)
Pharmacy Antibiotic Note ? ?[name] is a 51 y.o. male admitted on 06/28/2021 with  abdominal pain and possible sepsis .  Pharmacy has been consulted for vancomycin and cefepime dosing. ? ?Patient febrile to 101.5, wbc low at 2, hypertensive and tachycardic in 110s. Appears patient is ESRD and received hemodialysis earlier on 3/4. She is normal a TTS HD patient.  ? ?Plan: ?Vancomycin 1750mg  x1 then 750mg   IV every HD session.  Goal pre-HD trough 15-25 mcg/mL. ?Cefepime 2g x1 then 2g after each HD session ? ?Will give loading doses today and then follow up HD schedule for repeat dosing.  ? ?Height: 5\' 11"  (180.3 cm) ?Weight: 74.8 kg (165 lb) ?IBW/kg (Calculated) : 75.3 ? ?Temp (24hrs), Avg:101.2 ?F (38.4 ?C), Min:100.8 ?F (38.2 ?C), Max:101.5 ?F (38.6 ?C) ? ?Recent Labs  ?Lab 06/28/21 ?2339  ?WBC 2.0*  ?CREATININE 4.51*  ?LATICACIDVEN 1.3  ?  ?Estimated Creatinine Clearance: 20.5 mL/min (A) (by C-G formula based on SCr of 4.51 mg/dL (H)).   ? ?No Known Allergies ?Thank you for allowing pharmacy to be a part of this patient?s care. ? ?Erin Hearing PharmD., BCPS ?Clinical Pharmacist ?06/29/2021 1:08 AM ? ?

## 2021-06-29 NOTE — Discharge Instructions (Signed)
Your COVID test was positive.  You have had symptoms for about a week and a not a candidate for the antivirals at this time.  There was also a little constipation on the x-ray.  Some Colace may help with this.  Follow-up with your doctors. ?

## 2021-06-30 ENCOUNTER — Other Ambulatory Visit: Payer: Self-pay | Admitting: Nephrology

## 2021-06-30 DIAGNOSIS — R140 Abdominal distension (gaseous): Secondary | ICD-10-CM

## 2021-07-01 DIAGNOSIS — Z992 Dependence on renal dialysis: Secondary | ICD-10-CM | POA: Diagnosis not present

## 2021-07-01 DIAGNOSIS — R110 Nausea: Secondary | ICD-10-CM | POA: Diagnosis not present

## 2021-07-01 DIAGNOSIS — N2581 Secondary hyperparathyroidism of renal origin: Secondary | ICD-10-CM | POA: Diagnosis not present

## 2021-07-01 DIAGNOSIS — N186 End stage renal disease: Secondary | ICD-10-CM | POA: Diagnosis not present

## 2021-07-03 DIAGNOSIS — N186 End stage renal disease: Secondary | ICD-10-CM | POA: Diagnosis not present

## 2021-07-03 DIAGNOSIS — R110 Nausea: Secondary | ICD-10-CM | POA: Diagnosis not present

## 2021-07-03 DIAGNOSIS — N2581 Secondary hyperparathyroidism of renal origin: Secondary | ICD-10-CM | POA: Diagnosis not present

## 2021-07-03 DIAGNOSIS — Z992 Dependence on renal dialysis: Secondary | ICD-10-CM | POA: Diagnosis not present

## 2021-07-04 LAB — CULTURE, BLOOD (ROUTINE X 2): Culture: NO GROWTH

## 2021-07-05 DIAGNOSIS — R110 Nausea: Secondary | ICD-10-CM | POA: Diagnosis not present

## 2021-07-05 DIAGNOSIS — N2581 Secondary hyperparathyroidism of renal origin: Secondary | ICD-10-CM | POA: Diagnosis not present

## 2021-07-05 DIAGNOSIS — N186 End stage renal disease: Secondary | ICD-10-CM | POA: Diagnosis not present

## 2021-07-05 DIAGNOSIS — Z992 Dependence on renal dialysis: Secondary | ICD-10-CM | POA: Diagnosis not present

## 2021-07-08 DIAGNOSIS — N2581 Secondary hyperparathyroidism of renal origin: Secondary | ICD-10-CM | POA: Diagnosis not present

## 2021-07-08 DIAGNOSIS — N186 End stage renal disease: Secondary | ICD-10-CM | POA: Diagnosis not present

## 2021-07-08 DIAGNOSIS — Z992 Dependence on renal dialysis: Secondary | ICD-10-CM | POA: Diagnosis not present

## 2021-07-08 DIAGNOSIS — R110 Nausea: Secondary | ICD-10-CM | POA: Diagnosis not present

## 2021-07-12 DIAGNOSIS — N186 End stage renal disease: Secondary | ICD-10-CM | POA: Diagnosis not present

## 2021-07-12 DIAGNOSIS — R110 Nausea: Secondary | ICD-10-CM | POA: Diagnosis not present

## 2021-07-12 DIAGNOSIS — N2581 Secondary hyperparathyroidism of renal origin: Secondary | ICD-10-CM | POA: Diagnosis not present

## 2021-07-12 DIAGNOSIS — Z992 Dependence on renal dialysis: Secondary | ICD-10-CM | POA: Diagnosis not present

## 2021-07-15 DIAGNOSIS — N186 End stage renal disease: Secondary | ICD-10-CM | POA: Diagnosis not present

## 2021-07-15 DIAGNOSIS — N2581 Secondary hyperparathyroidism of renal origin: Secondary | ICD-10-CM | POA: Diagnosis not present

## 2021-07-15 DIAGNOSIS — Z992 Dependence on renal dialysis: Secondary | ICD-10-CM | POA: Diagnosis not present

## 2021-07-17 DIAGNOSIS — Z992 Dependence on renal dialysis: Secondary | ICD-10-CM | POA: Diagnosis not present

## 2021-07-17 DIAGNOSIS — N186 End stage renal disease: Secondary | ICD-10-CM | POA: Diagnosis not present

## 2021-07-17 DIAGNOSIS — N2581 Secondary hyperparathyroidism of renal origin: Secondary | ICD-10-CM | POA: Diagnosis not present

## 2021-07-19 DIAGNOSIS — N186 End stage renal disease: Secondary | ICD-10-CM | POA: Diagnosis not present

## 2021-07-19 DIAGNOSIS — N2581 Secondary hyperparathyroidism of renal origin: Secondary | ICD-10-CM | POA: Diagnosis not present

## 2021-07-19 DIAGNOSIS — Z992 Dependence on renal dialysis: Secondary | ICD-10-CM | POA: Diagnosis not present

## 2021-07-22 DIAGNOSIS — N186 End stage renal disease: Secondary | ICD-10-CM | POA: Diagnosis not present

## 2021-07-22 DIAGNOSIS — Z992 Dependence on renal dialysis: Secondary | ICD-10-CM | POA: Diagnosis not present

## 2021-07-22 DIAGNOSIS — N2581 Secondary hyperparathyroidism of renal origin: Secondary | ICD-10-CM | POA: Diagnosis not present

## 2021-07-24 DIAGNOSIS — N2581 Secondary hyperparathyroidism of renal origin: Secondary | ICD-10-CM | POA: Diagnosis not present

## 2021-07-24 DIAGNOSIS — N186 End stage renal disease: Secondary | ICD-10-CM | POA: Diagnosis not present

## 2021-07-24 DIAGNOSIS — Z992 Dependence on renal dialysis: Secondary | ICD-10-CM | POA: Diagnosis not present

## 2021-07-25 DIAGNOSIS — Z992 Dependence on renal dialysis: Secondary | ICD-10-CM | POA: Diagnosis not present

## 2021-07-25 DIAGNOSIS — M3214 Glomerular disease in systemic lupus erythematosus: Secondary | ICD-10-CM | POA: Diagnosis not present

## 2021-07-25 DIAGNOSIS — N186 End stage renal disease: Secondary | ICD-10-CM | POA: Diagnosis not present

## 2021-07-26 DIAGNOSIS — N186 End stage renal disease: Secondary | ICD-10-CM | POA: Diagnosis not present

## 2021-07-26 DIAGNOSIS — Z992 Dependence on renal dialysis: Secondary | ICD-10-CM | POA: Diagnosis not present

## 2021-07-26 DIAGNOSIS — I10 Essential (primary) hypertension: Secondary | ICD-10-CM | POA: Diagnosis not present

## 2021-07-26 DIAGNOSIS — M329 Systemic lupus erythematosus, unspecified: Secondary | ICD-10-CM | POA: Diagnosis not present

## 2021-07-26 DIAGNOSIS — N2581 Secondary hyperparathyroidism of renal origin: Secondary | ICD-10-CM | POA: Diagnosis not present

## 2021-07-26 DIAGNOSIS — R1084 Generalized abdominal pain: Secondary | ICD-10-CM | POA: Diagnosis not present

## 2021-07-29 DIAGNOSIS — N186 End stage renal disease: Secondary | ICD-10-CM | POA: Diagnosis not present

## 2021-07-29 DIAGNOSIS — Z992 Dependence on renal dialysis: Secondary | ICD-10-CM | POA: Diagnosis not present

## 2021-07-29 DIAGNOSIS — N2581 Secondary hyperparathyroidism of renal origin: Secondary | ICD-10-CM | POA: Diagnosis not present

## 2021-07-31 DIAGNOSIS — Z992 Dependence on renal dialysis: Secondary | ICD-10-CM | POA: Diagnosis not present

## 2021-07-31 DIAGNOSIS — N186 End stage renal disease: Secondary | ICD-10-CM | POA: Diagnosis not present

## 2021-07-31 DIAGNOSIS — N2581 Secondary hyperparathyroidism of renal origin: Secondary | ICD-10-CM | POA: Diagnosis not present

## 2021-08-02 DIAGNOSIS — Z992 Dependence on renal dialysis: Secondary | ICD-10-CM | POA: Diagnosis not present

## 2021-08-02 DIAGNOSIS — N186 End stage renal disease: Secondary | ICD-10-CM | POA: Diagnosis not present

## 2021-08-02 DIAGNOSIS — N2581 Secondary hyperparathyroidism of renal origin: Secondary | ICD-10-CM | POA: Diagnosis not present

## 2021-08-04 ENCOUNTER — Ambulatory Visit
Admission: RE | Admit: 2021-08-04 | Discharge: 2021-08-04 | Disposition: A | Discharge status: Home / Self Care | Payer: BC Managed Care – PPO | Source: Ambulatory Visit | Attending: Nephrology | Admitting: Nephrology

## 2021-08-04 DIAGNOSIS — K5730 Diverticulosis of large intestine without perforation or abscess without bleeding: Secondary | ICD-10-CM | POA: Diagnosis not present

## 2021-08-04 DIAGNOSIS — R140 Abdominal distension (gaseous): Secondary | ICD-10-CM

## 2021-08-04 DIAGNOSIS — I868 Varicose veins of other specified sites: Secondary | ICD-10-CM | POA: Diagnosis not present

## 2021-08-04 DIAGNOSIS — Z992 Dependence on renal dialysis: Secondary | ICD-10-CM | POA: Diagnosis not present

## 2021-08-04 DIAGNOSIS — N189 Chronic kidney disease, unspecified: Secondary | ICD-10-CM | POA: Diagnosis not present

## 2021-08-04 MED ORDER — IOPAMIDOL (ISOVUE-300) INJECTION 61%
100.0000 mL | Freq: Once | INTRAVENOUS | Status: AC | PRN
  Administered 2021-08-04: 100 mL via INTRAVENOUS

## 2021-08-05 ENCOUNTER — Other Ambulatory Visit (HOSPITAL_COMMUNITY): Payer: Self-pay | Admitting: Nephrology

## 2021-08-05 ENCOUNTER — Other Ambulatory Visit: Payer: Self-pay | Admitting: Nephrology

## 2021-08-05 DIAGNOSIS — Z992 Dependence on renal dialysis: Secondary | ICD-10-CM | POA: Diagnosis not present

## 2021-08-05 DIAGNOSIS — R188 Other ascites: Secondary | ICD-10-CM

## 2021-08-05 DIAGNOSIS — N186 End stage renal disease: Secondary | ICD-10-CM | POA: Diagnosis not present

## 2021-08-05 DIAGNOSIS — N2581 Secondary hyperparathyroidism of renal origin: Secondary | ICD-10-CM | POA: Diagnosis not present

## 2021-08-07 DIAGNOSIS — Z992 Dependence on renal dialysis: Secondary | ICD-10-CM | POA: Diagnosis not present

## 2021-08-07 DIAGNOSIS — N2581 Secondary hyperparathyroidism of renal origin: Secondary | ICD-10-CM | POA: Diagnosis not present

## 2021-08-07 DIAGNOSIS — N186 End stage renal disease: Secondary | ICD-10-CM | POA: Diagnosis not present

## 2021-08-08 ENCOUNTER — Ambulatory Visit (HOSPITAL_COMMUNITY)
Admission: RE | Admit: 2021-08-08 | Discharge: 2021-08-08 | Disposition: A | Discharge status: Home / Self Care | Payer: BC Managed Care – PPO | Source: Ambulatory Visit | Attending: Nephrology | Admitting: Nephrology

## 2021-08-08 DIAGNOSIS — R188 Other ascites: Secondary | ICD-10-CM | POA: Diagnosis not present

## 2021-08-08 DIAGNOSIS — N186 End stage renal disease: Secondary | ICD-10-CM | POA: Diagnosis not present

## 2021-08-08 DIAGNOSIS — I509 Heart failure, unspecified: Secondary | ICD-10-CM | POA: Diagnosis not present

## 2021-08-08 HISTORY — PX: IR PARACENTESIS: IMG2679

## 2021-08-08 MED ORDER — ALBUMIN HUMAN 25 % IV SOLN
INTRAVENOUS | Status: AC
  Administered 2021-08-08: 25 g via INTRAVENOUS
  Filled 2021-08-08: qty 100

## 2021-08-08 MED ORDER — ALBUMIN HUMAN 25 % IV SOLN: 25.0000 g | Freq: Once | INTRAVENOUS | Status: AC

## 2021-08-08 MED ORDER — LIDOCAINE HCL 1 % IJ SOLN
INTRAMUSCULAR | Status: AC
  Administered 2021-08-08: 10 mL
  Filled 2021-08-08: qty 20

## 2021-08-08 NOTE — Procedures (Signed)
PROCEDURE SUMMARY: ? ?Successful US guided therapeutic paracentesis from RLQ.  ?Yielded 1.5 L of clear, yellow fluid.  ?No immediate complications.  ?Pt tolerated well.  ? ?Specimen not sent for labs. ? ?EBL < 1 mL ? ?Tyson Alias, AGNP ?08/08/2021 ?2:13 PM ? ? ?

## 2021-08-09 DIAGNOSIS — Z992 Dependence on renal dialysis: Secondary | ICD-10-CM | POA: Diagnosis not present

## 2021-08-09 DIAGNOSIS — N2581 Secondary hyperparathyroidism of renal origin: Secondary | ICD-10-CM | POA: Diagnosis not present

## 2021-08-09 DIAGNOSIS — N186 End stage renal disease: Secondary | ICD-10-CM | POA: Diagnosis not present

## 2021-08-11 DIAGNOSIS — M329 Systemic lupus erythematosus, unspecified: Secondary | ICD-10-CM | POA: Diagnosis not present

## 2021-08-11 DIAGNOSIS — D72819 Decreased white blood cell count, unspecified: Secondary | ICD-10-CM | POA: Diagnosis not present

## 2021-08-11 DIAGNOSIS — M1990 Unspecified osteoarthritis, unspecified site: Secondary | ICD-10-CM | POA: Diagnosis not present

## 2021-08-11 DIAGNOSIS — Z8679 Personal history of other diseases of the circulatory system: Secondary | ICD-10-CM | POA: Diagnosis not present

## 2021-08-12 DIAGNOSIS — Z992 Dependence on renal dialysis: Secondary | ICD-10-CM | POA: Diagnosis not present

## 2021-08-12 DIAGNOSIS — R110 Nausea: Secondary | ICD-10-CM | POA: Diagnosis not present

## 2021-08-12 DIAGNOSIS — N186 End stage renal disease: Secondary | ICD-10-CM | POA: Diagnosis not present

## 2021-08-12 DIAGNOSIS — N2581 Secondary hyperparathyroidism of renal origin: Secondary | ICD-10-CM | POA: Diagnosis not present

## 2021-08-14 DIAGNOSIS — Z992 Dependence on renal dialysis: Secondary | ICD-10-CM | POA: Diagnosis not present

## 2021-08-14 DIAGNOSIS — N2581 Secondary hyperparathyroidism of renal origin: Secondary | ICD-10-CM | POA: Diagnosis not present

## 2021-08-14 DIAGNOSIS — R110 Nausea: Secondary | ICD-10-CM | POA: Diagnosis not present

## 2021-08-14 DIAGNOSIS — N186 End stage renal disease: Secondary | ICD-10-CM | POA: Diagnosis not present

## 2021-08-15 DIAGNOSIS — Z992 Dependence on renal dialysis: Secondary | ICD-10-CM | POA: Diagnosis not present

## 2021-08-15 DIAGNOSIS — N186 End stage renal disease: Secondary | ICD-10-CM | POA: Diagnosis not present

## 2021-08-15 DIAGNOSIS — I871 Compression of vein: Secondary | ICD-10-CM | POA: Diagnosis not present

## 2021-08-15 DIAGNOSIS — T82858A Stenosis of vascular prosthetic devices, implants and grafts, initial encounter: Secondary | ICD-10-CM | POA: Diagnosis not present

## 2021-08-16 DIAGNOSIS — N2581 Secondary hyperparathyroidism of renal origin: Secondary | ICD-10-CM | POA: Diagnosis not present

## 2021-08-16 DIAGNOSIS — Z992 Dependence on renal dialysis: Secondary | ICD-10-CM | POA: Diagnosis not present

## 2021-08-16 DIAGNOSIS — N186 End stage renal disease: Secondary | ICD-10-CM | POA: Diagnosis not present

## 2021-08-16 DIAGNOSIS — R110 Nausea: Secondary | ICD-10-CM | POA: Diagnosis not present

## 2021-08-19 DIAGNOSIS — N186 End stage renal disease: Secondary | ICD-10-CM | POA: Diagnosis not present

## 2021-08-19 DIAGNOSIS — N2581 Secondary hyperparathyroidism of renal origin: Secondary | ICD-10-CM | POA: Diagnosis not present

## 2021-08-19 DIAGNOSIS — Z992 Dependence on renal dialysis: Secondary | ICD-10-CM | POA: Diagnosis not present

## 2021-08-21 DIAGNOSIS — N186 End stage renal disease: Secondary | ICD-10-CM | POA: Diagnosis not present

## 2021-08-21 DIAGNOSIS — Z992 Dependence on renal dialysis: Secondary | ICD-10-CM | POA: Diagnosis not present

## 2021-08-21 DIAGNOSIS — N2581 Secondary hyperparathyroidism of renal origin: Secondary | ICD-10-CM | POA: Diagnosis not present

## 2021-08-23 DIAGNOSIS — Z992 Dependence on renal dialysis: Secondary | ICD-10-CM | POA: Diagnosis not present

## 2021-08-23 DIAGNOSIS — N186 End stage renal disease: Secondary | ICD-10-CM | POA: Diagnosis not present

## 2021-08-23 DIAGNOSIS — N2581 Secondary hyperparathyroidism of renal origin: Secondary | ICD-10-CM | POA: Diagnosis not present

## 2021-08-24 DIAGNOSIS — N186 End stage renal disease: Secondary | ICD-10-CM | POA: Diagnosis not present

## 2021-08-24 DIAGNOSIS — Z992 Dependence on renal dialysis: Secondary | ICD-10-CM | POA: Diagnosis not present

## 2021-08-24 DIAGNOSIS — M3214 Glomerular disease in systemic lupus erythematosus: Secondary | ICD-10-CM | POA: Diagnosis not present

## 2021-08-26 DIAGNOSIS — Z992 Dependence on renal dialysis: Secondary | ICD-10-CM | POA: Diagnosis not present

## 2021-08-26 DIAGNOSIS — N186 End stage renal disease: Secondary | ICD-10-CM | POA: Diagnosis not present

## 2021-08-26 DIAGNOSIS — N2581 Secondary hyperparathyroidism of renal origin: Secondary | ICD-10-CM | POA: Diagnosis not present

## 2021-08-28 ENCOUNTER — Encounter: Payer: Self-pay | Admitting: Nurse Practitioner

## 2021-08-28 DIAGNOSIS — N186 End stage renal disease: Secondary | ICD-10-CM | POA: Diagnosis not present

## 2021-08-28 DIAGNOSIS — Z992 Dependence on renal dialysis: Secondary | ICD-10-CM | POA: Diagnosis not present

## 2021-08-28 DIAGNOSIS — N2581 Secondary hyperparathyroidism of renal origin: Secondary | ICD-10-CM | POA: Diagnosis not present

## 2021-08-30 DIAGNOSIS — N2581 Secondary hyperparathyroidism of renal origin: Secondary | ICD-10-CM | POA: Diagnosis not present

## 2021-08-30 DIAGNOSIS — Z992 Dependence on renal dialysis: Secondary | ICD-10-CM | POA: Diagnosis not present

## 2021-08-30 DIAGNOSIS — N186 End stage renal disease: Secondary | ICD-10-CM | POA: Diagnosis not present

## 2021-09-02 DIAGNOSIS — N2581 Secondary hyperparathyroidism of renal origin: Secondary | ICD-10-CM | POA: Diagnosis not present

## 2021-09-02 DIAGNOSIS — Z992 Dependence on renal dialysis: Secondary | ICD-10-CM | POA: Diagnosis not present

## 2021-09-02 DIAGNOSIS — R110 Nausea: Secondary | ICD-10-CM | POA: Diagnosis not present

## 2021-09-02 DIAGNOSIS — N186 End stage renal disease: Secondary | ICD-10-CM | POA: Diagnosis not present

## 2021-09-04 DIAGNOSIS — Z992 Dependence on renal dialysis: Secondary | ICD-10-CM | POA: Diagnosis not present

## 2021-09-04 DIAGNOSIS — R110 Nausea: Secondary | ICD-10-CM | POA: Diagnosis not present

## 2021-09-04 DIAGNOSIS — N2581 Secondary hyperparathyroidism of renal origin: Secondary | ICD-10-CM | POA: Diagnosis not present

## 2021-09-04 DIAGNOSIS — N186 End stage renal disease: Secondary | ICD-10-CM | POA: Diagnosis not present

## 2021-09-06 DIAGNOSIS — N186 End stage renal disease: Secondary | ICD-10-CM | POA: Diagnosis not present

## 2021-09-06 DIAGNOSIS — Z992 Dependence on renal dialysis: Secondary | ICD-10-CM | POA: Diagnosis not present

## 2021-09-06 DIAGNOSIS — N2581 Secondary hyperparathyroidism of renal origin: Secondary | ICD-10-CM | POA: Diagnosis not present

## 2021-09-06 DIAGNOSIS — R110 Nausea: Secondary | ICD-10-CM | POA: Diagnosis not present

## 2021-09-09 DIAGNOSIS — N2581 Secondary hyperparathyroidism of renal origin: Secondary | ICD-10-CM | POA: Diagnosis not present

## 2021-09-09 DIAGNOSIS — N186 End stage renal disease: Secondary | ICD-10-CM | POA: Diagnosis not present

## 2021-09-09 DIAGNOSIS — Z992 Dependence on renal dialysis: Secondary | ICD-10-CM | POA: Diagnosis not present

## 2021-09-10 ENCOUNTER — Other Ambulatory Visit: Payer: Self-pay | Admitting: Nephrology

## 2021-09-10 ENCOUNTER — Other Ambulatory Visit (HOSPITAL_COMMUNITY): Payer: Self-pay | Admitting: Nephrology

## 2021-09-10 DIAGNOSIS — R188 Other ascites: Secondary | ICD-10-CM

## 2021-09-11 DIAGNOSIS — Z992 Dependence on renal dialysis: Secondary | ICD-10-CM | POA: Diagnosis not present

## 2021-09-11 DIAGNOSIS — N186 End stage renal disease: Secondary | ICD-10-CM | POA: Diagnosis not present

## 2021-09-11 DIAGNOSIS — N2581 Secondary hyperparathyroidism of renal origin: Secondary | ICD-10-CM | POA: Diagnosis not present

## 2021-09-12 ENCOUNTER — Ambulatory Visit (HOSPITAL_COMMUNITY)
Admission: RE | Admit: 2021-09-12 | Discharge: 2021-09-12 | Disposition: A | Discharge status: Home / Self Care | Payer: BC Managed Care – PPO | Source: Ambulatory Visit | Attending: Nephrology | Admitting: Nephrology

## 2021-09-12 DIAGNOSIS — K4090 Unilateral inguinal hernia, without obstruction or gangrene, not specified as recurrent: Secondary | ICD-10-CM | POA: Diagnosis not present

## 2021-09-12 DIAGNOSIS — R188 Other ascites: Secondary | ICD-10-CM | POA: Diagnosis not present

## 2021-09-12 DIAGNOSIS — M3214 Glomerular disease in systemic lupus erythematosus: Secondary | ICD-10-CM | POA: Diagnosis not present

## 2021-09-12 DIAGNOSIS — D61818 Other pancytopenia: Secondary | ICD-10-CM | POA: Diagnosis not present

## 2021-09-12 HISTORY — PX: IR PARACENTESIS: IMG2679

## 2021-09-12 MED ORDER — ALBUMIN HUMAN 25 % IV SOLN
25.0000 g | Freq: Once | INTRAVENOUS | Status: AC
  Administered 2021-09-12: 25 g via INTRAVENOUS

## 2021-09-12 MED ORDER — LIDOCAINE HCL (PF) 1 % IJ SOLN
INTRAMUSCULAR | Status: DC | PRN
  Administered 2021-09-12: 10 mL

## 2021-09-12 MED ORDER — ALBUMIN HUMAN 25 % IV SOLN
INTRAVENOUS | Status: AC
  Filled 2021-09-12: qty 100

## 2021-09-12 MED ORDER — LIDOCAINE HCL 1 % IJ SOLN
INTRAMUSCULAR | Status: AC
  Filled 2021-09-12: qty 20

## 2021-09-12 NOTE — Procedures (Signed)
PROCEDURE SUMMARY:  Successful US guided paracentesis from right lateral abdomen.  Yielded 847mL of blood-tinged fluid.  No immediate complications.  Pt tolerated well.   Specimen was sent for labs.  EBL < 36mL  Docia Barrier PA-C 09/12/2021 10:05 AM

## 2021-09-13 DIAGNOSIS — N2581 Secondary hyperparathyroidism of renal origin: Secondary | ICD-10-CM | POA: Diagnosis not present

## 2021-09-13 DIAGNOSIS — Z992 Dependence on renal dialysis: Secondary | ICD-10-CM | POA: Diagnosis not present

## 2021-09-13 DIAGNOSIS — N186 End stage renal disease: Secondary | ICD-10-CM | POA: Diagnosis not present

## 2021-09-16 DIAGNOSIS — N2581 Secondary hyperparathyroidism of renal origin: Secondary | ICD-10-CM | POA: Diagnosis not present

## 2021-09-16 DIAGNOSIS — N186 End stage renal disease: Secondary | ICD-10-CM | POA: Diagnosis not present

## 2021-09-16 DIAGNOSIS — Z992 Dependence on renal dialysis: Secondary | ICD-10-CM | POA: Diagnosis not present

## 2021-09-18 DIAGNOSIS — N2581 Secondary hyperparathyroidism of renal origin: Secondary | ICD-10-CM | POA: Diagnosis not present

## 2021-09-18 DIAGNOSIS — Z992 Dependence on renal dialysis: Secondary | ICD-10-CM | POA: Diagnosis not present

## 2021-09-18 DIAGNOSIS — N186 End stage renal disease: Secondary | ICD-10-CM | POA: Diagnosis not present

## 2021-09-20 DIAGNOSIS — Z992 Dependence on renal dialysis: Secondary | ICD-10-CM | POA: Diagnosis not present

## 2021-09-20 DIAGNOSIS — N186 End stage renal disease: Secondary | ICD-10-CM | POA: Diagnosis not present

## 2021-09-20 DIAGNOSIS — N2581 Secondary hyperparathyroidism of renal origin: Secondary | ICD-10-CM | POA: Diagnosis not present

## 2021-09-23 DIAGNOSIS — N186 End stage renal disease: Secondary | ICD-10-CM | POA: Diagnosis not present

## 2021-09-23 DIAGNOSIS — N2581 Secondary hyperparathyroidism of renal origin: Secondary | ICD-10-CM | POA: Diagnosis not present

## 2021-09-23 DIAGNOSIS — Z992 Dependence on renal dialysis: Secondary | ICD-10-CM | POA: Diagnosis not present

## 2021-09-24 DIAGNOSIS — N186 End stage renal disease: Secondary | ICD-10-CM | POA: Diagnosis not present

## 2021-09-24 DIAGNOSIS — M3214 Glomerular disease in systemic lupus erythematosus: Secondary | ICD-10-CM | POA: Diagnosis not present

## 2021-09-24 DIAGNOSIS — Z992 Dependence on renal dialysis: Secondary | ICD-10-CM | POA: Diagnosis not present

## 2021-09-25 DIAGNOSIS — Z992 Dependence on renal dialysis: Secondary | ICD-10-CM | POA: Diagnosis not present

## 2021-09-25 DIAGNOSIS — N2581 Secondary hyperparathyroidism of renal origin: Secondary | ICD-10-CM | POA: Diagnosis not present

## 2021-09-25 DIAGNOSIS — N186 End stage renal disease: Secondary | ICD-10-CM | POA: Diagnosis not present

## 2021-09-27 DIAGNOSIS — N186 End stage renal disease: Secondary | ICD-10-CM | POA: Diagnosis not present

## 2021-09-27 DIAGNOSIS — N2581 Secondary hyperparathyroidism of renal origin: Secondary | ICD-10-CM | POA: Diagnosis not present

## 2021-09-27 DIAGNOSIS — Z992 Dependence on renal dialysis: Secondary | ICD-10-CM | POA: Diagnosis not present

## 2021-09-30 DIAGNOSIS — N186 End stage renal disease: Secondary | ICD-10-CM | POA: Diagnosis not present

## 2021-09-30 DIAGNOSIS — N2581 Secondary hyperparathyroidism of renal origin: Secondary | ICD-10-CM | POA: Diagnosis not present

## 2021-09-30 DIAGNOSIS — Z992 Dependence on renal dialysis: Secondary | ICD-10-CM | POA: Diagnosis not present

## 2021-10-02 DIAGNOSIS — Z992 Dependence on renal dialysis: Secondary | ICD-10-CM | POA: Diagnosis not present

## 2021-10-02 DIAGNOSIS — N2581 Secondary hyperparathyroidism of renal origin: Secondary | ICD-10-CM | POA: Diagnosis not present

## 2021-10-02 DIAGNOSIS — N186 End stage renal disease: Secondary | ICD-10-CM | POA: Diagnosis not present

## 2021-10-04 DIAGNOSIS — N186 End stage renal disease: Secondary | ICD-10-CM | POA: Diagnosis not present

## 2021-10-04 DIAGNOSIS — Z992 Dependence on renal dialysis: Secondary | ICD-10-CM | POA: Diagnosis not present

## 2021-10-04 DIAGNOSIS — N2581 Secondary hyperparathyroidism of renal origin: Secondary | ICD-10-CM | POA: Diagnosis not present

## 2021-10-07 DIAGNOSIS — Z992 Dependence on renal dialysis: Secondary | ICD-10-CM | POA: Diagnosis not present

## 2021-10-07 DIAGNOSIS — N186 End stage renal disease: Secondary | ICD-10-CM | POA: Diagnosis not present

## 2021-10-07 DIAGNOSIS — N2581 Secondary hyperparathyroidism of renal origin: Secondary | ICD-10-CM | POA: Diagnosis not present

## 2021-10-09 ENCOUNTER — Other Ambulatory Visit: Payer: Self-pay | Admitting: Nephrology

## 2021-10-09 ENCOUNTER — Other Ambulatory Visit (HOSPITAL_COMMUNITY): Payer: Self-pay | Admitting: Nephrology

## 2021-10-09 DIAGNOSIS — Z992 Dependence on renal dialysis: Secondary | ICD-10-CM | POA: Diagnosis not present

## 2021-10-09 DIAGNOSIS — N2581 Secondary hyperparathyroidism of renal origin: Secondary | ICD-10-CM | POA: Diagnosis not present

## 2021-10-09 DIAGNOSIS — N186 End stage renal disease: Secondary | ICD-10-CM | POA: Diagnosis not present

## 2021-10-09 DIAGNOSIS — R188 Other ascites: Secondary | ICD-10-CM

## 2021-10-10 ENCOUNTER — Ambulatory Visit (HOSPITAL_COMMUNITY): Admission: RE | Admit: 2021-10-10 | Payer: BC Managed Care – PPO | Source: Ambulatory Visit

## 2021-10-10 ENCOUNTER — Ambulatory Visit (HOSPITAL_COMMUNITY)
Admission: RE | Admit: 2021-10-10 | Discharge: 2021-10-10 | Disposition: A | Discharge status: Home / Self Care | Payer: BC Managed Care – PPO | Source: Ambulatory Visit | Attending: Nephrology | Admitting: Nephrology

## 2021-10-10 ENCOUNTER — Encounter (HOSPITAL_COMMUNITY): Payer: Self-pay

## 2021-10-10 DIAGNOSIS — R188 Other ascites: Secondary | ICD-10-CM | POA: Insufficient documentation

## 2021-10-10 DIAGNOSIS — N186 End stage renal disease: Secondary | ICD-10-CM | POA: Diagnosis not present

## 2021-10-10 HISTORY — PX: IR PARACENTESIS: IMG2679

## 2021-10-10 MED ORDER — LIDOCAINE HCL 1 % IJ SOLN
INTRAMUSCULAR | Status: AC
  Filled 2021-10-10: qty 20

## 2021-10-10 MED ORDER — LIDOCAINE HCL 1 % IJ SOLN
INTRAMUSCULAR | Status: DC | PRN
  Administered 2021-10-10: 5 mL via INTRADERMAL

## 2021-10-10 MED ORDER — ALBUMIN HUMAN 25 % IV SOLN
25.0000 g | Freq: Once | INTRAVENOUS | Status: DC
Start: 2021-10-10 — End: 2021-10-11
  Filled 2021-10-10: qty 100

## 2021-10-10 MED ORDER — ALBUMIN HUMAN 25 % IV SOLN
INTRAVENOUS | Status: AC
  Filled 2021-10-10: qty 100

## 2021-10-10 NOTE — Procedures (Signed)
PROCEDURE SUMMARY:  Successful US guided paracentesis from RLQ.  Yielded 600cc of clear ascitic fluid.  No immediate complications.  Pt tolerated well.   Specimen not sent for labs.  EBL < 26mL  Mariel Gaudin PA-C 10/10/2021 1:25 PM

## 2021-10-11 DIAGNOSIS — N2581 Secondary hyperparathyroidism of renal origin: Secondary | ICD-10-CM | POA: Diagnosis not present

## 2021-10-11 DIAGNOSIS — N186 End stage renal disease: Secondary | ICD-10-CM | POA: Diagnosis not present

## 2021-10-11 DIAGNOSIS — Z992 Dependence on renal dialysis: Secondary | ICD-10-CM | POA: Diagnosis not present

## 2021-10-14 DIAGNOSIS — N186 End stage renal disease: Secondary | ICD-10-CM | POA: Diagnosis not present

## 2021-10-14 DIAGNOSIS — N2581 Secondary hyperparathyroidism of renal origin: Secondary | ICD-10-CM | POA: Diagnosis not present

## 2021-10-14 DIAGNOSIS — Z992 Dependence on renal dialysis: Secondary | ICD-10-CM | POA: Diagnosis not present

## 2021-10-16 DIAGNOSIS — N186 End stage renal disease: Secondary | ICD-10-CM | POA: Diagnosis not present

## 2021-10-16 DIAGNOSIS — N2581 Secondary hyperparathyroidism of renal origin: Secondary | ICD-10-CM | POA: Diagnosis not present

## 2021-10-16 DIAGNOSIS — Z992 Dependence on renal dialysis: Secondary | ICD-10-CM | POA: Diagnosis not present

## 2021-10-18 DIAGNOSIS — N2581 Secondary hyperparathyroidism of renal origin: Secondary | ICD-10-CM | POA: Diagnosis not present

## 2021-10-18 DIAGNOSIS — Z992 Dependence on renal dialysis: Secondary | ICD-10-CM | POA: Diagnosis not present

## 2021-10-18 DIAGNOSIS — N186 End stage renal disease: Secondary | ICD-10-CM | POA: Diagnosis not present

## 2021-10-21 DIAGNOSIS — N2581 Secondary hyperparathyroidism of renal origin: Secondary | ICD-10-CM | POA: Diagnosis not present

## 2021-10-21 DIAGNOSIS — N186 End stage renal disease: Secondary | ICD-10-CM | POA: Diagnosis not present

## 2021-10-21 DIAGNOSIS — Z992 Dependence on renal dialysis: Secondary | ICD-10-CM | POA: Diagnosis not present

## 2021-10-23 DIAGNOSIS — Z992 Dependence on renal dialysis: Secondary | ICD-10-CM | POA: Diagnosis not present

## 2021-10-23 DIAGNOSIS — N186 End stage renal disease: Secondary | ICD-10-CM | POA: Diagnosis not present

## 2021-10-23 DIAGNOSIS — N2581 Secondary hyperparathyroidism of renal origin: Secondary | ICD-10-CM | POA: Diagnosis not present

## 2021-10-24 DIAGNOSIS — Z992 Dependence on renal dialysis: Secondary | ICD-10-CM | POA: Diagnosis not present

## 2021-10-24 DIAGNOSIS — N186 End stage renal disease: Secondary | ICD-10-CM | POA: Diagnosis not present

## 2021-10-24 DIAGNOSIS — M3214 Glomerular disease in systemic lupus erythematosus: Secondary | ICD-10-CM | POA: Diagnosis not present

## 2021-10-28 DIAGNOSIS — N2581 Secondary hyperparathyroidism of renal origin: Secondary | ICD-10-CM | POA: Diagnosis not present

## 2021-10-28 DIAGNOSIS — Z992 Dependence on renal dialysis: Secondary | ICD-10-CM | POA: Diagnosis not present

## 2021-10-28 DIAGNOSIS — N186 End stage renal disease: Secondary | ICD-10-CM | POA: Diagnosis not present

## 2021-10-30 DIAGNOSIS — Z992 Dependence on renal dialysis: Secondary | ICD-10-CM | POA: Diagnosis not present

## 2021-10-30 DIAGNOSIS — N186 End stage renal disease: Secondary | ICD-10-CM | POA: Diagnosis not present

## 2021-10-30 DIAGNOSIS — N2581 Secondary hyperparathyroidism of renal origin: Secondary | ICD-10-CM | POA: Diagnosis not present

## 2021-11-01 DIAGNOSIS — N186 End stage renal disease: Secondary | ICD-10-CM | POA: Diagnosis not present

## 2021-11-01 DIAGNOSIS — Z992 Dependence on renal dialysis: Secondary | ICD-10-CM | POA: Diagnosis not present

## 2021-11-01 DIAGNOSIS — N2581 Secondary hyperparathyroidism of renal origin: Secondary | ICD-10-CM | POA: Diagnosis not present

## 2021-11-04 DIAGNOSIS — Z992 Dependence on renal dialysis: Secondary | ICD-10-CM | POA: Diagnosis not present

## 2021-11-04 DIAGNOSIS — N186 End stage renal disease: Secondary | ICD-10-CM | POA: Diagnosis not present

## 2021-11-04 DIAGNOSIS — N2581 Secondary hyperparathyroidism of renal origin: Secondary | ICD-10-CM | POA: Diagnosis not present

## 2021-11-06 DIAGNOSIS — Z992 Dependence on renal dialysis: Secondary | ICD-10-CM | POA: Diagnosis not present

## 2021-11-06 DIAGNOSIS — N2581 Secondary hyperparathyroidism of renal origin: Secondary | ICD-10-CM | POA: Diagnosis not present

## 2021-11-06 DIAGNOSIS — N186 End stage renal disease: Secondary | ICD-10-CM | POA: Diagnosis not present

## 2021-11-08 DIAGNOSIS — N186 End stage renal disease: Secondary | ICD-10-CM | POA: Diagnosis not present

## 2021-11-08 DIAGNOSIS — N2581 Secondary hyperparathyroidism of renal origin: Secondary | ICD-10-CM | POA: Diagnosis not present

## 2021-11-08 DIAGNOSIS — Z992 Dependence on renal dialysis: Secondary | ICD-10-CM | POA: Diagnosis not present

## 2021-11-11 DIAGNOSIS — Z992 Dependence on renal dialysis: Secondary | ICD-10-CM | POA: Diagnosis not present

## 2021-11-11 DIAGNOSIS — N186 End stage renal disease: Secondary | ICD-10-CM | POA: Diagnosis not present

## 2021-11-11 DIAGNOSIS — N2581 Secondary hyperparathyroidism of renal origin: Secondary | ICD-10-CM | POA: Diagnosis not present

## 2021-11-13 DIAGNOSIS — N186 End stage renal disease: Secondary | ICD-10-CM | POA: Diagnosis not present

## 2021-11-13 DIAGNOSIS — N2581 Secondary hyperparathyroidism of renal origin: Secondary | ICD-10-CM | POA: Diagnosis not present

## 2021-11-13 DIAGNOSIS — Z992 Dependence on renal dialysis: Secondary | ICD-10-CM | POA: Diagnosis not present

## 2021-11-15 DIAGNOSIS — N2581 Secondary hyperparathyroidism of renal origin: Secondary | ICD-10-CM | POA: Diagnosis not present

## 2021-11-15 DIAGNOSIS — N186 End stage renal disease: Secondary | ICD-10-CM | POA: Diagnosis not present

## 2021-11-15 DIAGNOSIS — Z992 Dependence on renal dialysis: Secondary | ICD-10-CM | POA: Diagnosis not present

## 2021-11-18 DIAGNOSIS — N2581 Secondary hyperparathyroidism of renal origin: Secondary | ICD-10-CM | POA: Diagnosis not present

## 2021-11-18 DIAGNOSIS — Z992 Dependence on renal dialysis: Secondary | ICD-10-CM | POA: Diagnosis not present

## 2021-11-18 DIAGNOSIS — N186 End stage renal disease: Secondary | ICD-10-CM | POA: Diagnosis not present

## 2021-11-20 DIAGNOSIS — N186 End stage renal disease: Secondary | ICD-10-CM | POA: Diagnosis not present

## 2021-11-20 DIAGNOSIS — Z992 Dependence on renal dialysis: Secondary | ICD-10-CM | POA: Diagnosis not present

## 2021-11-20 DIAGNOSIS — N2581 Secondary hyperparathyroidism of renal origin: Secondary | ICD-10-CM | POA: Diagnosis not present

## 2021-11-22 DIAGNOSIS — Z992 Dependence on renal dialysis: Secondary | ICD-10-CM | POA: Diagnosis not present

## 2021-11-22 DIAGNOSIS — N2581 Secondary hyperparathyroidism of renal origin: Secondary | ICD-10-CM | POA: Diagnosis not present

## 2021-11-22 DIAGNOSIS — N186 End stage renal disease: Secondary | ICD-10-CM | POA: Diagnosis not present

## 2021-11-24 DIAGNOSIS — Z992 Dependence on renal dialysis: Secondary | ICD-10-CM | POA: Diagnosis not present

## 2021-11-24 DIAGNOSIS — N186 End stage renal disease: Secondary | ICD-10-CM | POA: Diagnosis not present

## 2021-11-24 DIAGNOSIS — M3214 Glomerular disease in systemic lupus erythematosus: Secondary | ICD-10-CM | POA: Diagnosis not present

## 2021-11-25 DIAGNOSIS — N2581 Secondary hyperparathyroidism of renal origin: Secondary | ICD-10-CM | POA: Diagnosis not present

## 2021-11-25 DIAGNOSIS — Z992 Dependence on renal dialysis: Secondary | ICD-10-CM | POA: Diagnosis not present

## 2021-11-25 DIAGNOSIS — N186 End stage renal disease: Secondary | ICD-10-CM | POA: Diagnosis not present

## 2021-11-27 DIAGNOSIS — N2581 Secondary hyperparathyroidism of renal origin: Secondary | ICD-10-CM | POA: Diagnosis not present

## 2021-11-27 DIAGNOSIS — Z992 Dependence on renal dialysis: Secondary | ICD-10-CM | POA: Diagnosis not present

## 2021-11-27 DIAGNOSIS — N186 End stage renal disease: Secondary | ICD-10-CM | POA: Diagnosis not present

## 2021-11-28 ENCOUNTER — Ambulatory Visit (INDEPENDENT_AMBULATORY_CARE_PROVIDER_SITE_OTHER): Payer: BC Managed Care – PPO | Admitting: Family Medicine

## 2021-11-28 ENCOUNTER — Encounter: Payer: Self-pay | Admitting: Family Medicine

## 2021-11-28 VITALS — BP 170/94 | HR 95 | Temp 98.0°F | Wt 169.4 lb

## 2021-11-28 DIAGNOSIS — Z0000 Encounter for general adult medical examination without abnormal findings: Secondary | ICD-10-CM | POA: Diagnosis not present

## 2021-11-28 DIAGNOSIS — E538 Deficiency of other specified B group vitamins: Secondary | ICD-10-CM | POA: Diagnosis not present

## 2021-11-28 DIAGNOSIS — Z23 Encounter for immunization: Secondary | ICD-10-CM

## 2021-11-28 DIAGNOSIS — E039 Hypothyroidism, unspecified: Secondary | ICD-10-CM | POA: Diagnosis not present

## 2021-11-28 DIAGNOSIS — I5020 Unspecified systolic (congestive) heart failure: Secondary | ICD-10-CM

## 2021-11-28 DIAGNOSIS — I3139 Other pericardial effusion (noninflammatory): Secondary | ICD-10-CM

## 2021-11-28 DIAGNOSIS — R7989 Other specified abnormal findings of blood chemistry: Secondary | ICD-10-CM

## 2021-11-28 NOTE — Progress Notes (Addendum)
Established Patient Office Visit  Subjective   Patient ID: [name], male    DOB: [birth year]  Age: 51 y.o. MRN: [id number]  Chief Complaint  Patient presents with   Follow-up    Routine follow up, no concerns. Patient fasting.     HPI for follow-up.  Last seen here in the clinic almost 2 years ago.  Ongoing history of end-stage renal disease with dialysis 3 times weekly.  Blood pressure varies at dialysis.  Believes that he is taking lisinopril.  He is taking a phosphate binder.  He is not taking thyroid supplementation.  Continues to live independently.  Continues work and event [place] has been busy.  Last echo this past December demonstrated normal ejection fraction with severe LVH.  He does deal with some shortness of breath.  He sleeps on 1-2 pillows.  No recent chest pain.    Review of Systems  Constitutional: Negative.   HENT: Negative.    Eyes:  Negative for blurred vision, discharge and redness.  Respiratory:  Positive for shortness of breath.   Cardiovascular: Negative.   Gastrointestinal:  Negative for abdominal pain.  Genitourinary: Negative.   Musculoskeletal: Negative.  Negative for myalgias.  Skin:  Negative for rash.  Neurological:  Negative for tingling, loss of consciousness and weakness.  Endo/Heme/Allergies:  Negative for polydipsia.  Psychiatric/Behavioral: Negative.        Objective:     BP (!) 170/94 (BP Location: Right Arm, Patient Position: Sitting, Cuff Size: Normal)   Pulse 95   Temp 98 F (36.7 C) (Temporal)   Wt 169 lb 6.4 oz (76.8 kg)   SpO2 97%   BMI 23.63 kg/m    Physical Exam Constitutional:      General: He is not in acute distress.    Appearance: Normal appearance. He is not ill-appearing, toxic-appearing or diaphoretic.  HENT:     Head: Normocephalic and atraumatic.     Right Ear: External ear normal.     Left Ear: External ear normal.     Mouth/Throat:     Mouth: Mucous membranes are moist.     Pharynx:  Oropharynx is clear. No oropharyngeal exudate or posterior oropharyngeal erythema.  Eyes:     General: No scleral icterus.       Right eye: No discharge.        Left eye: No discharge.     Extraocular Movements: Extraocular movements intact.     Conjunctiva/sclera: Conjunctivae normal.     Pupils: Pupils are equal, round, and reactive to light.  Neck:     Vascular: No carotid bruit.  Cardiovascular:     Rate and Rhythm: Normal rate and regular rhythm.  Pulmonary:     Effort: Pulmonary effort is normal. No respiratory distress.     Breath sounds: Rhonchi present.  Abdominal:     General: Bowel sounds are normal.     Tenderness: There is no abdominal tenderness. There is no guarding.  Musculoskeletal:     Cervical back: No rigidity or tenderness.     Right lower leg: No edema.     Left lower leg: No edema.  Lymphadenopathy:     Cervical: No cervical adenopathy.  Skin:    General: Skin is warm and dry.  Neurological:     Mental Status: He is alert and oriented to person, place, and time.  Psychiatric:        Mood and Affect: Mood normal.        Behavior: Behavior  normal.      Results for orders placed or performed in visit on 11/28/21  TSH  Result Value Ref Range   TSH 5.66 (H) 0.40 - 4.50 mIU/L  PSA  Result Value Ref Range   PSA 3.41 < OR = 4.00 ng/mL  Lipid panel  Result Value Ref Range   Cholesterol 132 <200 mg/dL   HDL 61 > OR = 40 mg/dL   Triglycerides 44 <150 mg/dL   LDL Cholesterol (Calc) 58 mg/dL (calc)   Total CHOL/HDL Ratio 2.2 <5.0 (calc)   Non-HDL Cholesterol (Calc) 71 <130 mg/dL (calc)  Basic metabolic panel  Result Value Ref Range   Glucose, Bld 81 65 - 99 mg/dL   BUN 23 7 - 25 mg/dL   Creat 5.49 (H) 0.70 - 1.30 mg/dL   BUN/Creatinine Ratio 4 (L) 6 - 22 (calc)   Sodium 134 (L) 135 - 146 mmol/L   Potassium 3.7 3.5 - 5.3 mmol/L   Chloride 93 (L) 98 - 110 mmol/L   CO2 31 20 - 32 mmol/L   Calcium 9.0 8.6 - 10.3 mg/dL  CBC  Result Value Ref Range    WBC 5.3 3.8 - 10.8 Thousand/uL   RBC 3.47 (L) 4.20 - 5.80 Million/uL   Hemoglobin 10.6 (L) 13.2 - 17.1 g/dL   HCT 31.0 (L) 38.5 - 50.0 %   MCV 89.3 80.0 - 100.0 fL   MCH 30.5 27.0 - 33.0 pg   MCHC 34.2 32.0 - 36.0 g/dL   RDW 14.9 11.0 - 15.0 %   Platelets 126 (L) 140 - 400 Thousand/uL   MPV 11.4 7.5 - 12.5 fL  Vitamin B12  Result Value Ref Range   Vitamin B-12 349 200 - 1,100 pg/mL  B Nat Peptide  Result Value Ref Range   Brain Natriuretic Peptide 4,030 (H) <100 pg/mL      The 10-year ASCVD risk score (Arnett DK, et al., 2019) is: 3.3%    Assessment & Plan:   Problem List Items Addressed This Visit       Cardiovascular and Mediastinum   Congestive heart failure (HCC)   Relevant Orders   B Nat Peptide (Completed)   Ambulatory referral to Cardiology   Pericardial effusion   Relevant Orders   Ambulatory referral to Cardiology     Endocrine   Acquired hypothyroidism   Relevant Medications   levothyroxine (SYNTHROID) 50 MCG tablet   Other Relevant Orders   TSH (Completed)     Other   Healthcare maintenance   Relevant Orders   PSA (Completed)   Lipid panel (Completed)   Basic metabolic panel (Completed)   CBC (Completed)   Ambulatory referral to Gastroenterology   B12 deficiency   Relevant Orders   Vitamin B12 (Completed)   Elevated TSH   Relevant Medications   levothyroxine (SYNTHROID) 50 MCG tablet   Need for shingles vaccine - Primary   Relevant Orders   Varicella-zoster vaccine IM (Completed)    Return in about 4 weeks (around 12/26/2021), or Rosuvastatin 3 okay hello Mr.[name], MD

## 2021-11-29 DIAGNOSIS — N2581 Secondary hyperparathyroidism of renal origin: Secondary | ICD-10-CM | POA: Diagnosis not present

## 2021-11-29 DIAGNOSIS — N186 End stage renal disease: Secondary | ICD-10-CM | POA: Diagnosis not present

## 2021-11-29 DIAGNOSIS — Z992 Dependence on renal dialysis: Secondary | ICD-10-CM | POA: Diagnosis not present

## 2021-11-29 LAB — LIPID PANEL
Cholesterol: 132 mg/dL (ref <200)
HDL: 61 mg/dL (ref >=40)
LDL Cholesterol (Calc): 58 mg/dL (calc)
Non-HDL Cholesterol (Calc): 71 mg/dL (calc) (ref <130)
Total CHOL/HDL Ratio: 2.2 (calc) (ref <5.0)
Triglycerides: 44 mg/dL (ref <150)

## 2021-11-29 LAB — BASIC METABOLIC PANEL
BUN/Creatinine Ratio: 4 (calc) — ABNORMAL LOW (ref 6–22)
BUN: 23 mg/dL (ref 7–25)
CO2: 31 mmol/L (ref 20–32)
Calcium: 9 mg/dL (ref 8.6–10.3)
Chloride: 93 mmol/L — ABNORMAL LOW (ref 98–110)
Creat: 5.49 mg/dL — ABNORMAL HIGH (ref 0.70–1.30)
Glucose, Bld: 81 mg/dL (ref 65–99)
Potassium: 3.7 mmol/L (ref 3.5–5.3)
Sodium: 134 mmol/L — ABNORMAL LOW (ref 135–146)

## 2021-11-29 LAB — CBC
HCT: 31 % — ABNORMAL LOW (ref 38.5–50.0)
Hemoglobin: 10.6 g/dL — ABNORMAL LOW (ref 13.2–17.1)
MCH: 30.5 pg (ref 27.0–33.0)
MCHC: 34.2 g/dL (ref 32.0–36.0)
MCV: 89.3 fL (ref 80.0–100.0)
MPV: 11.4 fL (ref 7.5–12.5)
Platelets: 126 10*3/uL — ABNORMAL LOW (ref 140–400)
RBC: 3.47 10*6/uL — ABNORMAL LOW (ref 4.20–5.80)
RDW: 14.9 % (ref 11.0–15.0)
WBC: 5.3 10*3/uL (ref 3.8–10.8)

## 2021-11-29 LAB — TSH: TSH: 5.66 mIU/L — ABNORMAL HIGH (ref 0.40–4.50)

## 2021-11-29 LAB — BRAIN NATRIURETIC PEPTIDE: Brain Natriuretic Peptide: 4030 pg/mL — ABNORMAL HIGH (ref <100)

## 2021-11-29 LAB — PSA: PSA: 3.41 ng/mL (ref <=4.00)

## 2021-11-29 LAB — VITAMIN B12: Vitamin B-12: 349 pg/mL (ref 200–1100)

## 2021-12-01 ENCOUNTER — Encounter: Payer: Self-pay | Admitting: Family Medicine

## 2021-12-01 DIAGNOSIS — Z23 Encounter for immunization: Secondary | ICD-10-CM | POA: Insufficient documentation

## 2021-12-01 DIAGNOSIS — I3139 Other pericardial effusion (noninflammatory): Secondary | ICD-10-CM | POA: Insufficient documentation

## 2021-12-01 MED ORDER — LEVOTHYROXINE SODIUM 50 MCG PO TABS: 50.0000 ug | ORAL_TABLET | Freq: Every day | ORAL | 2 refills | Status: DC

## 2021-12-01 NOTE — Addendum Note (Signed)
Addended by: Jon Billings on: 12/01/2021 04:33 PM   Modules accepted: Orders

## 2021-12-02 DIAGNOSIS — Z992 Dependence on renal dialysis: Secondary | ICD-10-CM | POA: Diagnosis not present

## 2021-12-02 DIAGNOSIS — N186 End stage renal disease: Secondary | ICD-10-CM | POA: Diagnosis not present

## 2021-12-02 DIAGNOSIS — N2581 Secondary hyperparathyroidism of renal origin: Secondary | ICD-10-CM | POA: Diagnosis not present

## 2021-12-04 DIAGNOSIS — N2581 Secondary hyperparathyroidism of renal origin: Secondary | ICD-10-CM | POA: Diagnosis not present

## 2021-12-04 DIAGNOSIS — N186 End stage renal disease: Secondary | ICD-10-CM | POA: Diagnosis not present

## 2021-12-04 DIAGNOSIS — Z992 Dependence on renal dialysis: Secondary | ICD-10-CM | POA: Diagnosis not present

## 2021-12-06 DIAGNOSIS — N2581 Secondary hyperparathyroidism of renal origin: Secondary | ICD-10-CM | POA: Diagnosis not present

## 2021-12-06 DIAGNOSIS — N186 End stage renal disease: Secondary | ICD-10-CM | POA: Diagnosis not present

## 2021-12-06 DIAGNOSIS — Z992 Dependence on renal dialysis: Secondary | ICD-10-CM | POA: Diagnosis not present

## 2021-12-09 DIAGNOSIS — N186 End stage renal disease: Secondary | ICD-10-CM | POA: Diagnosis not present

## 2021-12-09 DIAGNOSIS — Z992 Dependence on renal dialysis: Secondary | ICD-10-CM | POA: Diagnosis not present

## 2021-12-09 DIAGNOSIS — N2581 Secondary hyperparathyroidism of renal origin: Secondary | ICD-10-CM | POA: Diagnosis not present

## 2021-12-10 ENCOUNTER — Ambulatory Visit: Payer: BC Managed Care – PPO | Admitting: Physician Assistant

## 2021-12-11 DIAGNOSIS — Z992 Dependence on renal dialysis: Secondary | ICD-10-CM | POA: Diagnosis not present

## 2021-12-11 DIAGNOSIS — N2581 Secondary hyperparathyroidism of renal origin: Secondary | ICD-10-CM | POA: Diagnosis not present

## 2021-12-11 DIAGNOSIS — N186 End stage renal disease: Secondary | ICD-10-CM | POA: Diagnosis not present

## 2021-12-13 DIAGNOSIS — N2581 Secondary hyperparathyroidism of renal origin: Secondary | ICD-10-CM | POA: Diagnosis not present

## 2021-12-13 DIAGNOSIS — Z992 Dependence on renal dialysis: Secondary | ICD-10-CM | POA: Diagnosis not present

## 2021-12-13 DIAGNOSIS — N186 End stage renal disease: Secondary | ICD-10-CM | POA: Diagnosis not present

## 2021-12-16 DIAGNOSIS — N186 End stage renal disease: Secondary | ICD-10-CM | POA: Diagnosis not present

## 2021-12-16 DIAGNOSIS — Z992 Dependence on renal dialysis: Secondary | ICD-10-CM | POA: Diagnosis not present

## 2021-12-16 DIAGNOSIS — N2581 Secondary hyperparathyroidism of renal origin: Secondary | ICD-10-CM | POA: Diagnosis not present

## 2021-12-18 DIAGNOSIS — Z992 Dependence on renal dialysis: Secondary | ICD-10-CM | POA: Diagnosis not present

## 2021-12-18 DIAGNOSIS — N2581 Secondary hyperparathyroidism of renal origin: Secondary | ICD-10-CM | POA: Diagnosis not present

## 2021-12-18 DIAGNOSIS — N186 End stage renal disease: Secondary | ICD-10-CM | POA: Diagnosis not present

## 2021-12-20 DIAGNOSIS — Z992 Dependence on renal dialysis: Secondary | ICD-10-CM | POA: Diagnosis not present

## 2021-12-20 DIAGNOSIS — N2581 Secondary hyperparathyroidism of renal origin: Secondary | ICD-10-CM | POA: Diagnosis not present

## 2021-12-20 DIAGNOSIS — N186 End stage renal disease: Secondary | ICD-10-CM | POA: Diagnosis not present

## 2021-12-23 DIAGNOSIS — N2581 Secondary hyperparathyroidism of renal origin: Secondary | ICD-10-CM | POA: Diagnosis not present

## 2021-12-23 DIAGNOSIS — Z992 Dependence on renal dialysis: Secondary | ICD-10-CM | POA: Diagnosis not present

## 2021-12-23 DIAGNOSIS — N186 End stage renal disease: Secondary | ICD-10-CM | POA: Diagnosis not present

## 2021-12-25 DIAGNOSIS — N2581 Secondary hyperparathyroidism of renal origin: Secondary | ICD-10-CM | POA: Diagnosis not present

## 2021-12-25 DIAGNOSIS — M3214 Glomerular disease in systemic lupus erythematosus: Secondary | ICD-10-CM | POA: Diagnosis not present

## 2021-12-25 DIAGNOSIS — N186 End stage renal disease: Secondary | ICD-10-CM | POA: Diagnosis not present

## 2021-12-25 DIAGNOSIS — Z992 Dependence on renal dialysis: Secondary | ICD-10-CM | POA: Diagnosis not present

## 2021-12-27 DIAGNOSIS — N2581 Secondary hyperparathyroidism of renal origin: Secondary | ICD-10-CM | POA: Diagnosis not present

## 2021-12-27 DIAGNOSIS — N186 End stage renal disease: Secondary | ICD-10-CM | POA: Diagnosis not present

## 2021-12-27 DIAGNOSIS — Z992 Dependence on renal dialysis: Secondary | ICD-10-CM | POA: Diagnosis not present

## 2021-12-30 DIAGNOSIS — Z992 Dependence on renal dialysis: Secondary | ICD-10-CM | POA: Diagnosis not present

## 2021-12-30 DIAGNOSIS — N186 End stage renal disease: Secondary | ICD-10-CM | POA: Diagnosis not present

## 2021-12-30 DIAGNOSIS — N2581 Secondary hyperparathyroidism of renal origin: Secondary | ICD-10-CM | POA: Diagnosis not present

## 2022-01-02 ENCOUNTER — Encounter: Payer: Self-pay | Admitting: Family Medicine

## 2022-01-02 ENCOUNTER — Telehealth: Payer: Self-pay

## 2022-01-02 ENCOUNTER — Ambulatory Visit (INDEPENDENT_AMBULATORY_CARE_PROVIDER_SITE_OTHER): Payer: BC Managed Care – PPO | Admitting: Family Medicine

## 2022-01-02 VITALS — BP 164/98 | HR 95 | Temp 96.9°F | Ht 71.0 in | Wt 176.6 lb

## 2022-01-02 DIAGNOSIS — N184 Chronic kidney disease, stage 4 (severe): Secondary | ICD-10-CM | POA: Diagnosis not present

## 2022-01-02 DIAGNOSIS — I5020 Unspecified systolic (congestive) heart failure: Secondary | ICD-10-CM

## 2022-01-02 DIAGNOSIS — I10 Essential (primary) hypertension: Secondary | ICD-10-CM

## 2022-01-02 LAB — BASIC METABOLIC PANEL
BUN: 59 mg/dL — ABNORMAL HIGH (ref 6–23)
CO2: 27 mEq/L (ref 19–32)
Calcium: 9.2 mg/dL (ref 8.4–10.5)
Chloride: 94 mEq/L — ABNORMAL LOW (ref 96–112)
Creatinine, Ser: 8.82 mg/dL (ref 0.40–1.50)
GFR: 6.36 mL/min — CL (ref >60.00)
Glucose, Bld: 81 mg/dL (ref 70–99)
Potassium: 4 mEq/L (ref 3.5–5.1)
Sodium: 134 mEq/L — ABNORMAL LOW (ref 135–145)

## 2022-01-02 LAB — BRAIN NATRIURETIC PEPTIDE: Pro B Natriuretic peptide (BNP): >4923 pg/mL — ABNORMAL HIGH (ref 0.0–100.0)

## 2022-01-02 MED ORDER — IRBESARTAN 150 MG PO TABS: 150.0000 mg | ORAL_TABLET | Freq: Every day | ORAL | 1 refills | Status: DC

## 2022-01-02 MED ORDER — FUROSEMIDE 40 MG PO TABS: 40.0000 mg | ORAL_TABLET | Freq: Every day | ORAL | 3 refills | Status: DC

## 2022-01-02 NOTE — Progress Notes (Signed)
Established Patient Office Visit  Subjective   Patient ID: [name], male    DOB: May 03, 1970  Age: 51 y.o. MRN: [id number]  Chief Complaint  Patient presents with  . Follow-up    8 week follow up, no concerns. Patient fasting.     HPI returns for follow-up.  Has restarted levothyroxine on a fasting stomach each morning.  Brings in blood pressures obtained before and after dialysis.  They are all elevated.  Ongoing shortness of breath even at night when he tries to sleep.  He does continue to make urine.  He has had no chest pain or diaphoresis.  Continues 2-3 servings of whole several times weekly.  He is fluid restricting as recommended by nephrology.  Has gained 7 pounds since last visit.  {History (Optional):23778}  Review of Systems  Constitutional: Negative.   HENT: Negative.    Eyes:  Negative for blurred vision, discharge and redness.  Respiratory:  Positive for shortness of breath. Negative for cough, hemoptysis, sputum production and wheezing.   Cardiovascular: Negative.  Negative for chest pain and palpitations.  Gastrointestinal:  Negative for abdominal pain.  Genitourinary: Negative.   Musculoskeletal: Negative.  Negative for myalgias.  Skin:  Negative for rash.  Neurological:  Negative for tingling, loss of consciousness and weakness.  Endo/Heme/Allergies:  Negative for polydipsia.      Objective:     BP (!) 164/98 (BP Location: Right Arm, Patient Position: Sitting, Cuff Size: Normal)   Pulse 95   Temp (!) 96.9 F (36.1 C) (Temporal)   Ht 5\' 11"  (1.803 m)   Wt 176 lb 9.6 oz (80.1 kg)   SpO2 97%   BMI 24.63 kg/m  Wt Readings from Last 3 Encounters:  01/02/22 176 lb 9.6 oz (80.1 kg)  11/28/21 169 lb 6.4 oz (76.8 kg)  06/28/21 165 lb (74.8 kg)      Physical Exam Constitutional:      General: He is not in acute distress.    Appearance: Normal appearance. He is not ill-appearing, toxic-appearing or diaphoretic.  HENT:     Head: Normocephalic and  atraumatic.     Right Ear: External ear normal.     Left Ear: External ear normal.     Mouth/Throat:     Mouth: Mucous membranes are moist.     Pharynx: Oropharynx is clear. No oropharyngeal exudate or posterior oropharyngeal erythema.  Eyes:     General: No scleral icterus.       Right eye: No discharge.        Left eye: No discharge.     Extraocular Movements: Extraocular movements intact.     Conjunctiva/sclera: Conjunctivae normal.     Pupils: Pupils are equal, round, and reactive to light.  Cardiovascular:     Rate and Rhythm: Normal rate and regular rhythm.  Pulmonary:     Effort: Pulmonary effort is normal. No respiratory distress.     Breath sounds: No decreased air movement. Examination of the right-middle field reveals rhonchi. Examination of the left-middle field reveals rhonchi. Examination of the right-lower field reveals rhonchi. Examination of the left-lower field reveals rhonchi. Rhonchi present. No wheezing or rales.  Abdominal:     General: Bowel sounds are normal.     Tenderness: There is no abdominal tenderness. There is no guarding.  Musculoskeletal:     Cervical back: No rigidity or tenderness.  Skin:    General: Skin is warm and dry.  Neurological:     Mental Status: He is  alert and oriented to person, place, and time.  Psychiatric:        Mood and Affect: Mood normal.        Behavior: Behavior normal.     No results found for any visits on 01/02/22.  {Labs (Optional):23779}  The 10-year ASCVD risk score (Arnett DK, et al., 2019) is: 3.7%    Assessment & Plan:   Problem List Items Addressed This Visit       Cardiovascular and Mediastinum   Systolic congestive heart failure (HCC) - Primary   Relevant Medications   furosemide (LASIX) 40 MG tablet   irbesartan (AVAPRO) 150 MG tablet   Other Relevant Orders   B Nat Peptide   Ambulatory referral to Cardiology   Essential hypertension   Relevant Medications   furosemide (LASIX) 40 MG tablet    irbesartan (AVAPRO) 150 MG tablet   Other Relevant Orders   Basic metabolic panel   Ambulatory referral to Cardiology     Genitourinary   Stage 4 chronic kidney disease ([place])    Return in about 2 weeks (around 01/16/2022).  I have switched Avapro 150 daily and added Lasix 40 mg every morning.  Fluid restrict as recommended.  [name], MD

## 2022-01-02 NOTE — Telephone Encounter (Signed)
Provider aware of results

## 2022-01-02 NOTE — Telephone Encounter (Signed)
[facility]  Caller: Hope Scales   Receiver: Bintou Lafata L. CMA/CPT   Time: 4:05 pm   Critical Value:   Creatinine 8.89 GFR 6.30 B Nat Peptide greater than 4,923

## 2022-01-03 DIAGNOSIS — N186 End stage renal disease: Secondary | ICD-10-CM | POA: Diagnosis not present

## 2022-01-03 DIAGNOSIS — Z992 Dependence on renal dialysis: Secondary | ICD-10-CM | POA: Diagnosis not present

## 2022-01-03 DIAGNOSIS — N2581 Secondary hyperparathyroidism of renal origin: Secondary | ICD-10-CM | POA: Diagnosis not present

## 2022-01-05 NOTE — Progress Notes (Deleted)
Cardiology Office Note:    Date:  01/05/2022   ID:  [name], DOB January 21, 1971, MRN [id number]  PCP:  [name], MD   [place] Providers Cardiologist:  None { Click to update primary MD,subspecialty MD or APP then REFRESH:1}    Referring MD: [name]   No chief complaint on file. ***  History of Present Illness:    [name] is a 51 y.o. male with a hx of chronic systolic heart failure, heavy alcohol use, lupus, ascending aortic aneurysm,   He underwent nuclear stres test 11/2019 that was nonischemic. Echocardiogram at that time with preserved EF, normal RV function, biatrial enlargement, no significant valvular disease, ascending aorta 40 mm.        Past Medical History:  Diagnosis Date   Chronic kidney disease    Lupus ([place])    Thyroid disease     Past Surgical History:  Procedure Laterality Date   AV FISTULA PLACEMENT Left 08/26/2020   Procedure: LEFT RADIOCEPHALIC ARTERIOVENOUS (AV) FISTULA CREATION;  Surgeon: Angelia Mould, MD;  Location: [facility];  Service: Vascular;  Laterality: Left;   IR FLUORO GUIDE CV LINE RIGHT  04/30/2020   IR PARACENTESIS  05/07/2020   IR PARACENTESIS  05/10/2020   IR PARACENTESIS  06/03/2020   IR PARACENTESIS  06/24/2020   IR PARACENTESIS  07/15/2020   IR PARACENTESIS  07/29/2020   IR PARACENTESIS  08/14/2020   IR PARACENTESIS  06/11/2021   IR PARACENTESIS  08/08/2021   IR PARACENTESIS  09/12/2021   IR PARACENTESIS  10/10/2021   IR PERC TUN PERIT CATH WO PORT S&I /IMAG  05/03/2020   IR US GUIDE VASC ACCESS RIGHT  04/30/2020   IR US GUIDE VASC ACCESS RIGHT  05/03/2020    Current Medications: No outpatient medications have been marked as taking for the 01/07/22 encounter (Appointment) with Ledora Bottcher, [place].     Allergies:   Patient has no known allergies.   Social History   Socioeconomic History   Marital status: Single    Spouse name: Not on file   Number of children: Not on file    Years of education: Not on file   Highest education level: Not on file  Occupational History   Not on file  Tobacco Use   Smoking status: Never   Smokeless tobacco: Never  Vaping Use   Vaping Use: Never used  Substance and Sexual Activity   Alcohol use: Yes    Comment: 2-4 glasses of wine 2-3 times weekly   Drug use: No   Sexual activity: Not on file  Other Topics Concern   Not on file  Social History Narrative   Not on file   Social Determinants of Health   Financial Resource Strain: Not on file  Food Insecurity: Not on file  Transportation Needs: Not on file  Physical Activity: Not on file  Stress: Not on file  Social Connections: Not on file     Family History: The patient's ***family history includes Cancer in his mother.  ROS:   Please see the history of present illness.    *** All other systems reviewed and are negative.  EKGs/Labs/Other Studies Reviewed:    The following studies were reviewed today: ***  EKG:  EKG is *** ordered today.  The ekg ordered today demonstrates ***  Recent Labs: 06/28/2021: ALT 18 11/28/2021: Brain Natriuretic Peptide 4,030; Hemoglobin 10.6; Platelets 126; TSH 5.66 01/02/2022: BUN 59; Creatinine, Ser 8.82; Potassium  4.0; Pro B Natriuretic peptide (BNP) >4923.0; Sodium 134  Recent Lipid Panel    Component Value Date/Time   CHOL 132 11/28/2021 1441   TRIG 44 11/28/2021 1441   HDL 61 11/28/2021 1441   CHOLHDL 2.2 11/28/2021 1441   VLDL 25.2 11/01/2018 1156   LDLCALC 58 11/28/2021 1441   LDLDIRECT 39.0 11/01/2018 1156     Risk Assessment/Calculations:   {Does this patient have ATRIAL FIBRILLATION?[phone]}  No BP recorded.  {Refresh Note OR Click here to enter BP  :1}***         Physical Exam:    VS:  There were no vitals taken for this visit.    Wt Readings from Last 3 Encounters:  01/02/22 176 lb 9.6 oz (80.1 kg)  11/28/21 169 lb 6.4 oz (76.8 kg)  06/28/21 165 lb (74.8 kg)     GEN: *** Well nourished, well  developed in no acute distress HEENT: Normal NECK: No JVD; No carotid bruits LYMPHATICS: No lymphadenopathy CARDIAC: ***RRR, no murmurs, rubs, gallops RESPIRATORY:  Clear to auscultation without rales, wheezing or rhonchi  ABDOMEN: Soft, non-tender, non-distended MUSCULOSKELETAL:  No edema; No deformity  SKIN: Warm and dry NEUROLOGIC:  Alert and oriented x 3 PSYCHIATRIC:  Normal affect   ASSESSMENT:    No diagnosis found. PLAN:    In order of problems listed above:  ***      {Are you ordering a CV Procedure (e.g. stress test, cath, DCCV, TEE, etc)?   Press F2        :[id number]}    Medication Adjustments/Labs and Tests Ordered: Current medicines are reviewed at length with the patient today.  Concerns regarding medicines are outlined above.  No orders of the defined types were placed in this encounter.  No orders of the defined types were placed in this encounter.   There are no Patient Instructions on file for this visit.   Signed, [place], PA  01/05/2022 2:10 PM    [place]

## 2022-01-06 DIAGNOSIS — N2581 Secondary hyperparathyroidism of renal origin: Secondary | ICD-10-CM | POA: Diagnosis not present

## 2022-01-06 DIAGNOSIS — Z992 Dependence on renal dialysis: Secondary | ICD-10-CM | POA: Diagnosis not present

## 2022-01-06 DIAGNOSIS — N186 End stage renal disease: Secondary | ICD-10-CM | POA: Diagnosis not present

## 2022-01-06 NOTE — Progress Notes (Addendum)
Cardiology Office Note:    Date:  01/07/2022   ID:  [name], DOB [birth year], MRN [id number]  PCP:  [name], [place] Providers Cardiologist:  Jenean Lindau, MD     Referring MD: [name],*   CC: Shortness of breath and HFpEF follow-up  History of Present Illness:    [name] is a 51 y.o. male with a hx of the following:  HFpEF  SLE Cardiac murmur Acquired hypothyroidism HTN History of significant alcohol use ESRD (HD on T/Th/Sat secondary to lupus nephritis) History of ascites with history of paracentesis, no liver abnormality noted History of diverticulosis without diverticulitis  He was first seen in 2021 by Dr. Julius Bowels for evaluation of congestive heart failure at the request of his primary care provider.  Reported dyspnea on exertion and orthopnea, with improvement of symptoms after diuresis. Occasional chest discomfort that was described as stabbing, unrelated to exertion.  Denied any history of smoking.  Reported bilateral pedal edema.  Echocardiogram in 2021 revealed LVEF 60 to 65%, no R WMA, moderately dilated left atrial size, mildly dilated right atrial size, trivial MR, trivial AR with mild aortic valve sclerosis no aortic valve stenosis, aortic dilatation noted-mild dilation of the ascending aorta measuring 40 mm.  Lexi scan was unremarkable in 2021.  Echo in 2022 LVEF 55 to 60%, moderate LVH, trivial MR, mild calcification of aortic valve including nodular calcification of the tip of the 9 coronary cusps with restricted motion-cannot completely exclude small vegetation involving the noncoronary cusp, although could be acoustic shadowing.  If there is high concern for endocarditis, a TEE can be considered, trivial AR, pleural fluid/possible ascites noted.  Echo in 2022 revealed LVEF 55 to 60%, severe LVH, elevated LVEDP, moderate calcification of aortic valve leaflet with focal calcification at the tip of the  noncoronary cusp, trivial AR, sclerosis/calcification of aortic valve is present without aortic stenosis, small pericardial effusion noted at that time that was posterior to the left ventricle, trivial MR.   Presented to his primary care provider on January 02, 2022 with chief complaint of shortness of breath and orthopnea.  He had gained 7 pounds since the last time he was seen.  BNP > 4,923. Referral was placed to cardiology.  Lasix 40 mg was added daily and  irbesartan was switched.  BMP revealed creatinine 8.82, eGFR 6.36. Today he presents to our clinic for evaluation of his CHF.  Says he still is having shortness of breath, but this seems to be a little bit better than when he saw his primary care provider last Friday.  Weight has been going up and down, blood pressure is not well controlled and has been elevated.  Stays very busy working at his job, works for a [employer].  Denies any chest pain, palpitations, syncope, dizziness, orthopnea, PND, any acute bleeding, or claudication.  He still makes very little urine.  Does hemodialysis on Tuesdays, Thursdays, and Saturdays.  Says he has had a history of fluid in his lungs-has had 1 thoracentesis in the past, can tell he has some fluid, but he thinks is very minimal.  He is requesting future virtual visit follow-up for his CHF.  Gets paracentesis once every few months.  Denies any recent swelling in his arms or legs.  Denies any other questions or concerns today.  Past Medical History:  Diagnosis Date   Chronic kidney disease    Lupus ([place])    Thyroid disease  Past Surgical History:  Procedure Laterality Date   AV FISTULA PLACEMENT Left 08/26/2020   Procedure: LEFT RADIOCEPHALIC ARTERIOVENOUS (AV) FISTULA CREATION;  Surgeon: Angelia Mould, MD;  Location: [facility];  Service: Vascular;  Laterality: Left;   IR FLUORO GUIDE CV LINE RIGHT  04/30/2020   IR PARACENTESIS  05/07/2020   IR PARACENTESIS  05/10/2020   IR PARACENTESIS  06/03/2020    IR PARACENTESIS  06/24/2020   IR PARACENTESIS  07/15/2020   IR PARACENTESIS  07/29/2020   IR PARACENTESIS  08/14/2020   IR PARACENTESIS  06/11/2021   IR PARACENTESIS  08/08/2021   IR PARACENTESIS  09/12/2021   IR PARACENTESIS  10/10/2021   IR PERC TUN PERIT CATH WO PORT S&I /IMAG  05/03/2020   IR US GUIDE VASC ACCESS RIGHT  04/30/2020   IR US GUIDE VASC ACCESS RIGHT  05/03/2020    Current Medications: Current Meds  Medication Sig   hydroxychloroquine (PLAQUENIL) 200 MG tablet Take 200 mg by mouth 3 (three) times a week. No set days   irbesartan (AVAPRO) 300 MG tablet Take 1 tablet (300 mg total) by mouth daily.   levothyroxine (SYNTHROID) 50 MCG tablet Take 1 tablet (50 mcg total) by mouth daily before breakfast.    irbesartan (AVAPRO) 150 MG tablet Take 1 tablet (150 mg total) by mouth daily.     Allergies:   Patient has no known allergies.   Social History   Socioeconomic History   Marital status: Single    Spouse name: Not on file   Number of children: Not on file   Years of education: Not on file   Highest education level: Not on file  Occupational History   Not on file  Tobacco Use   Smoking status: Never   Smokeless tobacco: Never  Vaping Use   Vaping Use: Never used  Substance and Sexual Activity   Alcohol use: Yes    Comment: 2-4 glasses of wine 2-3 times weekly   Drug use: No   Sexual activity: Not on file  Other Topics Concern   Not on file  Social History Narrative   Not on file   Social Determinants of Health   Financial Resource Strain: Not on file  Food Insecurity: Not on file  Transportation Needs: Not on file  Physical Activity: Not on file  Stress: Not on file  Social Connections: Not on file     Family History: The patient's family history includes Cancer in his mother.  ROS:   Review of Systems  Constitutional: Negative.   HENT: Negative.    Eyes: Negative.   Respiratory:  Positive for shortness of breath. Negative for cough, hemoptysis, sputum  production and wheezing.        See HPI.  Cardiovascular: Negative.   Gastrointestinal: Negative.        History of swelling in abdomen.  See HPI.  Genitourinary: Negative.        Minimal urine production.  He is hemodialysis patient.  See HPI.  Musculoskeletal: Negative.   Skin: Negative.        History of lupus.  See HPI.  Neurological: Negative.   Endo/Heme/Allergies: Negative.   Psychiatric/Behavioral: Negative.      Please see the history of present illness.    All other systems reviewed and are negative.  EKGs/Labs/Other Studies Reviewed:    The following studies were reviewed today:   EKG:  EKG is ordered today.  The ekg ordered today demonstrates sinus tachycardia, 103 bpm,  LVH, nonspecific ST/T wave abnormality, possible left atrial enlargement, otherwise nothing acute.   2D echocardiogram on April 25, 2021: 1. Left ventricular ejection fraction, by estimation, is 55 to 60%. The  left ventricle has normal function. The left ventricle has no regional  wall motion abnormalities. There is severe concentric left ventricular  hypertrophy. Left ventricular diastolic   parameters are indeterminate. Elevated left ventricular end-diastolic  pressure.   2. Right ventricular systolic function is normal. The right ventricular  size is normal. Tricuspid regurgitation signal is inadequate for assessing  PA pressure.   3. There is moderate calcification of the aortic valve leaflet with focal  calcification at the tip of the noncoronary cusp. The aortic valve is  tricuspid. Aortic valve regurgitation is trivial. Aortic valve  sclerosis/calcification is present, without  any evidence of aortic stenosis. Aortic valve area, by VTI measures 3.30  cm. Aortic valve mean gradient measures 3.0 mmHg. Aortic valve Vmax  measures 1.17 m/s.   4. A small pericardial effusion is present. The pericardial effusion is  posterior to the left ventricle.   5. The mitral valve is normal in  structure. Trivial mitral valve  regurgitation. No evidence of mitral stenosis.   6. The inferior vena cava is normal in size with greater than 50%  respiratory variability, suggesting right atrial pressure of 3 mmHg.   Lexiscan on December 14, 2019: There was no ST segment deviation noted during stress. Nuclear stress EF: 47%. The left ventricular ejection fraction is mildly decreased (45-54%). The study is normal. This is an intermediate risk study.   Intermediate risk study due to decreased systolic function (EF 71%).  Normal perfusion. Recent Labs: 06/28/2021: ALT 18 11/28/2021: Brain Natriuretic Peptide 4,030; Hemoglobin 10.6; Platelets 126; TSH 5.66 01/02/2022: BUN 59; Creatinine, Ser 8.82; Potassium 4.0; Pro B Natriuretic peptide (BNP) >4923.0; Sodium 134  Recent Lipid Panel    Component Value Date/Time   CHOL 132 11/28/2021 1441   TRIG 44 11/28/2021 1441   HDL 61 11/28/2021 1441   CHOLHDL 2.2 11/28/2021 1441   VLDL 25.2 11/01/2018 1156   LDLCALC 58 11/28/2021 1441   LDLDIRECT 39.0 11/01/2018 1156     Risk Assessment/Calculations:     The 10-year ASCVD risk score (Arnett DK, et al., 2019) is: 3.1%   Values used to calculate the score:     Age: 37 years     Sex: Male     Is Non-Hispanic African American: No     Diabetic: No     Tobacco smoker: No     Systolic Blood Pressure: 696 mmHg     Is BP treated: Yes     HDL Cholesterol: 61 mg/dL     Total Cholesterol: 132 mg/dL  HYPERTENSION CONTROL Vitals:   01/07/22 1319 01/07/22 1335  BP: (!) 166/98 (!) 150/99    The patient's blood pressure is elevated above target today.  In order to address the patient's elevated BP: A current anti-hypertensive medication was adjusted today.; A new medication was prescribed today.            Physical Exam:    VS:  BP (!) 150/99 (BP Location: Left Arm, Patient Position: Sitting, Cuff Size: Normal)   Pulse (!) 103   Ht 5' 11.5" (1.816 m)   Wt 170 lb (77.1 kg)   SpO2 94%   BMI  23.38 kg/m     Wt Readings from Last 3 Encounters:  01/07/22 170 lb (77.1 kg)  01/02/22 176 lb 9.6 oz (80.1  kg)  11/28/21 169 lb 6.4 oz (76.8 kg)    Vitals:   01/07/22 1319 01/07/22 1335  BP: (!) 166/98 (!) 150/99  Pulse: (!) 103   SpO2: 94%     GEN: Well nourished, well developed in no acute distress HEENT: Normal NECK: No JVD; No carotid bruits CARDIAC: S1/S2, fast heart rate, regular rhythm, no murmurs, rubs, gallops; 2+ peripheral pulses throughout, strong and equal bilaterally.  AV fistula noted along left anterior forearm, positive for thrill and bruit. RESPIRATORY: Diminished with scattered crackles along posterior lung fields to auscultation without rales, wheezing or rhonchi  ABDOMEN: Soft,distended abdomen, nontender, bowel sounds x4 MUSCULOSKELETAL: Swelling to abdomen, otherwise no edema noted.; No deformity  SKIN: Warm and dry, scattered abrasions/ from lupus, overall pale appearance, otherwise normal NEUROLOGIC:  Alert and oriented x 3 PSYCHIATRIC:  Normal affect   ASSESSMENT:    1. SOB (shortness of breath)   2. Heart failure with preserved ejection fraction, unspecified HF chronicity ([place])   3. Pericardial effusion   4. SLE (systemic lupus erythematosus related syndrome) ([place])   5. ESRD (end stage renal disease) ([place])   6. Hypothyroidism, unspecified type   7. Hypertension, unspecified type    PLAN:    In order of problems listed above:  HFpEF, shortness of breath, pericardial effusion - chronic, stable Still having some SHOB, says he feels like this is improved. Scattered diminished crackles on exam, otherwise no peripheral edema noted except for distended abdomen, soft, and nontender. Echo in 03/2021 revealed LVEF 55 to 60% with small pericardial effusion present posterior to the left ventricle. Update 2D echocardiogram to evaluate pericardial effusion. Thoracentesis hx.  Will refer to pulmonology d/t hx of lupus and recurrent history of shortness of breath.  Low sodium diet, fluid restriction <2L, and daily weights encouraged. Educated to contact our office for weight gain of 2 lbs overnight or 5 lbs in one week.  2. SLE - chronic, stable This has been chronic.  Continue to follow-up with PCP and rheumatology.  Continue current medication regimen.  3.  End-stage renal disease due to lupus-chronic, stable He is receiving hemodialysis on Tuesday, Thursday, and Saturday.  Continue to receive dialysis treatments.  Continue to follow-up with PCP.   4.  Hypothyroidism - chronic, stable This is being managed by his PCP.  TSH 5.660 in August 2023.  Continue levothyroxine.  Continue follow-up with PCP.  5. HTN - chronic, not at goal of SBP < 130 BP on arrival 166/98, repeat manual by me 150/99.  Blood pressure at home is around this range.  Initiate carvedilol 3.125 mg twice daily and increase irbesartan from 150 mg daily to 300 mg daily. Discussed to monitor BP at home at least 2 hours after medications and sitting for 5-10 minutes.  He will send me BP readings within 2 weeks, if BP is not at goal and heart rate allows, plan to increase carvedilol to 6.25 mg twice daily.  Discussed stopping ibuprofen as this could be causing blood pressure elevations and using Tylenol as needed as needed for pain.  5.  Disposition: Follow-up with me or APP via virtual visit in 5 weeks or sooner if anything changes.    Medication Adjustments/Labs and Tests Ordered: Current medicines are reviewed at length with the patient today.  Concerns regarding medicines are outlined above.  Orders Placed This Encounter  Procedures   Ambulatory referral to Pulmonology   EKG 12-Lead   ECHOCARDIOGRAM COMPLETE   Meds ordered this encounter  Medications  carvedilol (COREG) 3.125 MG tablet    Sig: Take 1 tablet (3.125 mg total) by mouth 2 (two) times daily.    Dispense:  180 tablet    Refill:  3   irbesartan (AVAPRO) 300 MG tablet    Sig: Take 1 tablet (300 mg total) by mouth  daily.    Dispense:  90 tablet    Refill:  3    Patient Instructions  Medication Instructions:  Your physician has recommended you make the following change in your medication:  START: Carvedilol 3.135m twice daily.  INCREASE: Irbesartan 3062mdaily.  *If you need a refill on your cardiac medications before your next appointment, please call your pharmacy*  Please take your blood pressure daily for 2 weeks and send in a MyChart message. Please include heart rates.   HOW TO TAKE YOUR BLOOD PRESSURE: Rest 5 minutes before taking your blood pressure. Don't smoke or drink caffeinated beverages for at least 30 minutes before. Take your blood pressure before (not after) you eat. Sit comfortably with your back supported and both feet on the floor (don't cross your legs). Elevate your arm to heart level on a table or a desk. Use the proper sized cuff. It should fit smoothly and snugly around your bare upper arm. There should be enough room to slip a fingertip under the cuff. The bottom edge of the cuff should be 1 inch above the crease of the elbow. Ideally, take 3 measurements at one sitting and record the average.    Lab Work: NONE ordered at this time of appointment   If you have labs (blood work) drawn today and your tests are completely normal, you will receive your results only by: [place]) OR A paper copy in the mail If you have any lab test that is abnormal or we need to change your treatment, we will call you to review the results.   Testing/Procedures: Your physician has requested that you have an echocardiogram. Echocardiography is a painless test that uses sound waves to create images of your heart. It provides your doctor with information about the size and shape of your heart and how well your heart's chambers and valves are working. This procedure takes approximately one hour. There are no restrictions for this procedure.    Follow-Up: At  [facility] and your health needs are our priority.  As part of our continuing mission to provide you with exceptional heart care, we have created designated Provider Care Teams.  These Care Teams include your primary Cardiologist (physician) and Advanced Practice Providers (APPs -  Physician Assistants and Nurse Practitioners) who all work together to provide you with the care you need, when you need it.  We recommend signing up for the patient portal called "MyChart".  Sign up information is provided on this After Visit Summary.  MyChart is used to connect with patients for Virtual Visits (Telemedicine).  Patients are able to view lab/test results, encounter notes, upcoming appointments, etc.  Non-urgent messages can be sent to your provider as well.   To learn more about what you can do with MyChart, go to [email or web address]   Your next appointment:   5 week(s)  The format for your next appointment:   Virtual Visit   Provider:   ElFinis BudNP    Signed, ElFinis BudNP  01/07/2022 6:36 PM    [place]

## 2022-01-07 ENCOUNTER — Encounter: Payer: Self-pay | Admitting: Physician Assistant

## 2022-01-07 ENCOUNTER — Ambulatory Visit: Payer: BC Managed Care – PPO | Attending: Physician Assistant | Admitting: Nurse Practitioner

## 2022-01-07 VITALS — BP 150/99 | HR 103 | Ht 71.5 in | Wt 170.0 lb

## 2022-01-07 DIAGNOSIS — N186 End stage renal disease: Secondary | ICD-10-CM

## 2022-01-07 DIAGNOSIS — E039 Hypothyroidism, unspecified: Secondary | ICD-10-CM

## 2022-01-07 DIAGNOSIS — I10 Essential (primary) hypertension: Secondary | ICD-10-CM

## 2022-01-07 DIAGNOSIS — I5030 Unspecified diastolic (congestive) heart failure: Secondary | ICD-10-CM

## 2022-01-07 DIAGNOSIS — M329 Systemic lupus erythematosus, unspecified: Secondary | ICD-10-CM

## 2022-01-07 DIAGNOSIS — I3139 Other pericardial effusion (noninflammatory): Secondary | ICD-10-CM | POA: Diagnosis not present

## 2022-01-07 DIAGNOSIS — R0602 Shortness of breath: Secondary | ICD-10-CM | POA: Diagnosis not present

## 2022-01-07 MED ORDER — IRBESARTAN 300 MG PO TABS: 300.0000 mg | ORAL_TABLET | Freq: Every day | ORAL | 3 refills | Status: DC

## 2022-01-07 MED ORDER — CARVEDILOL 3.125 MG PO TABS: 3.1250 mg | ORAL_TABLET | Freq: Two times a day (BID) | ORAL | 3 refills | Status: DC

## 2022-01-07 NOTE — Patient Instructions (Signed)
Medication Instructions:  Your physician has recommended you make the following change in your medication:  START: Carvedilol 3.125mg  twice daily.  INCREASE: Irbesartan 300mg  daily.  *If you need a refill on your cardiac medications before your next appointment, please call your pharmacy*  Please take your blood pressure daily for 2 weeks and send in a MyChart message. Please include heart rates.   HOW TO TAKE YOUR BLOOD PRESSURE: Rest 5 minutes before taking your blood pressure. Don't smoke or drink caffeinated beverages for at least 30 minutes before. Take your blood pressure before (not after) you eat. Sit comfortably with your back supported and both feet on the floor (don't cross your legs). Elevate your arm to heart level on a table or a desk. Use the proper sized cuff. It should fit smoothly and snugly around your bare upper arm. There should be enough room to slip a fingertip under the cuff. The bottom edge of the cuff should be 1 inch above the crease of the elbow. Ideally, take 3 measurements at one sitting and record the average.    Lab Work: NONE ordered at this time of appointment   If you have labs (blood work) drawn today and your tests are completely normal, you will receive your results only by: [place] (if you have MyChart) OR A paper copy in the mail If you have any lab test that is abnormal or we need to change your treatment, we will call you to review the results.   Testing/Procedures: Your physician has requested that you have an echocardiogram. Echocardiography is a painless test that uses sound waves to create images of your heart. It provides your doctor with information about the size and shape of your heart and how well your heart's chambers and valves are working. This procedure takes approximately one hour. There are no restrictions for this procedure.    Follow-Up: At [facility], you and your health needs are our priority.  As part of  our continuing mission to provide you with exceptional heart care, we have created designated Provider Care Teams.  These Care Teams include your primary Cardiologist (physician) and Advanced Practice Providers (APPs -  Physician Assistants and Nurse Practitioners) who all work together to provide you with the care you need, when you need it.  We recommend signing up for the patient portal called "MyChart".  Sign up information is provided on this After Visit Summary.  MyChart is used to connect with patients for Virtual Visits (Telemedicine).  Patients are able to view lab/test results, encounter notes, upcoming appointments, etc.  Non-urgent messages can be sent to your provider as well.   To learn more about what you can do with MyChart, go to [email or web address].    Your next appointment:   5 week(s)  The format for your next appointment:   Virtual Visit   Provider:   [name], NP

## 2022-01-08 DIAGNOSIS — N2581 Secondary hyperparathyroidism of renal origin: Secondary | ICD-10-CM | POA: Diagnosis not present

## 2022-01-08 DIAGNOSIS — N186 End stage renal disease: Secondary | ICD-10-CM | POA: Diagnosis not present

## 2022-01-08 DIAGNOSIS — Z992 Dependence on renal dialysis: Secondary | ICD-10-CM | POA: Diagnosis not present

## 2022-01-09 NOTE — Progress Notes (Addendum)
Cardiology Office Note:    Date:  01/07/22  ID:  [name], DOB Nov 25, 1970, MRN [id number]   PCP:  [name], [facility] Cardiologist:  Jenean Lindau, MD     Referring MD: [name],*   CC: Chronic shortness of breath and HFpEF follow-up  History of Present Illness:    [name] is a 51 y.o. male with a hx of the following:  HFpEF  SLE Cardiac murmur Acquired hypothyroidism HTN History of significant alcohol use ESRD (HD on T/Th/Sat secondary to lupus nephritis) History of ascites with history of paracentesis, no liver abnormality noted History of diverticulosis without diverticulitis  He was first seen in 2021 by Dr. Julius Bowels for evaluation of congestive heart failure at the request of his primary care provider.  Reported dyspnea on exertion and orthopnea, with improvement of symptoms after diuresis. Occasional chest discomfort that was described as stabbing, unrelated to exertion.  Denied any history of smoking.  Reported bilateral pedal edema.  Echocardiogram in 2021 revealed LVEF 60 to 65%, no R WMA, moderately dilated left atrial size, mildly dilated right atrial size, trivial MR, trivial AR with mild aortic valve sclerosis no aortic valve stenosis, aortic dilatation noted-mild dilation of the ascending aorta measuring 40 mm.  Lexiscan was unremarkable in 2021.  Echo in 2022 LVEF 55 to 60%, moderate LVH, trivial MR, mild calcification of aortic valve including nodular calcification of the tip of the 9 coronary cusps with restricted motion-cannot completely exclude small vegetation involving the noncoronary cusp, although could be acoustic shadowing.  If there is high concern for endocarditis, a TEE can be considered, trivial AR, pleural fluid/possible ascites noted.  Echo in 2022 revealed LVEF 55 to 60%, severe LVH, elevated LVEDP, moderate calcification of aortic valve leaflet with focal calcification at the tip of  the noncoronary cusp, trivial AR, sclerosis/calcification of aortic valve is present without aortic stenosis, small pericardial effusion noted at that time that was posterior to the left ventricle, trivial MR. Was diagnosed with COVID-19 in March 2023. CT of the chest was negative for PE, small bilateral pleural effusion with atelectasis at the lung bases noted, moderate ascites present, splenomegaly, and aortic atherosclerosis and scattered coronary artery calcifications noted.   Presented to his PCP on January 02, 2022 with CC of shortness of breath and orthopnea that had been occurring since his previous PCP visit.  He had gained 7 pounds since the last time he was seen.  BNP > 4,923. Referral was placed to cardiology. At most previous PCP visit, Lasix 40 mg was added daily and  losartan was switched to irbesartan. He has taken Lasix 40 mg daily for 5 days so far.  BMP revealed creatinine 8.82, eGFR 6.36 at PCP's office.   Today he presents to the clinic for evaluation of his CHF and chronic SOB and was not sudden in appearance. Says he still is having shortness of breath with some improvement since last Friday. He says dialysis helps his symptoms and the recent medication changes at PCP's office have helped some. Weight has been going up and down, blood pressure is not well controlled and has been elevated since last Friday. Says he has had a history of fluid in his lungs-has had 1 thoracentesis in the past, says he can tell he has some fluid in his lungs, but he thinks it is very minimal and says this symptoms improve after HD, he says he doesn't believe CXR is  warranted, as HD will help take off extra fluid. Today he denies any recent chest pain, tachycardia, palpitations, syncope, presyncope, lightheadedness, dizziness, orthopnea, PND, melena, hemoptysis, hematuria, or claudication.  He still makes very little urine and does HD on Tuesdays, Thursdays, and Saturdays. Gets paracentesis done once every few  months.  Denies any swelling in his arms or legs.  Denies any other questions or concerns today.  Stays very busy working at his job, works for a [employer]. He is requesting future virtual visit follow-up for his CHF.  Past Medical History:  Diagnosis Date   Chronic kidney disease    Lupus ([place])    Thyroid disease     Past Surgical History:  Procedure Laterality Date   AV FISTULA PLACEMENT Left 08/26/2020   Procedure: LEFT RADIOCEPHALIC ARTERIOVENOUS (AV) FISTULA CREATION;  Surgeon: Angelia Mould, MD;  Location: [facility];  Service: Vascular;  Laterality: Left;   IR FLUORO GUIDE CV LINE RIGHT  04/30/2020   IR PARACENTESIS  05/07/2020   IR PARACENTESIS  05/10/2020   IR PARACENTESIS  06/03/2020   IR PARACENTESIS  06/24/2020   IR PARACENTESIS  07/15/2020   IR PARACENTESIS  07/29/2020   IR PARACENTESIS  08/14/2020   IR PARACENTESIS  06/11/2021   IR PARACENTESIS  08/08/2021   IR PARACENTESIS  09/12/2021   IR PARACENTESIS  10/10/2021   IR PERC TUN PERIT CATH WO PORT S&I /IMAG  05/03/2020   IR US GUIDE VASC ACCESS RIGHT  04/30/2020   IR US GUIDE VASC ACCESS RIGHT  05/03/2020    Current Medications: Current Meds  Medication Sig   hydroxychloroquine (PLAQUENIL) 200 MG tablet Take 200 mg by mouth 3 (three) times a week. No set days   irbesartan (AVAPRO) 300 MG tablet Take 1 tablet (300 mg total) by mouth daily.   levothyroxine (SYNTHROID) 50 MCG tablet Take 1 tablet (50 mcg total) by mouth daily before breakfast.    irbesartan (AVAPRO) 150 MG tablet Take 1 tablet (150 mg total) by mouth daily.     Allergies:   Patient has no known allergies.   Social History   Socioeconomic History   Marital status: Single    Spouse name: Not on file   Number of children: Not on file   Years of education: Not on file   Highest education level: Not on file  Occupational History   Not on file  Tobacco Use   Smoking status: Never   Smokeless tobacco: Never  Vaping Use   Vaping Use: Never used  Substance  and Sexual Activity   Alcohol use: Yes    Comment: 2-4 glasses of wine 2-3 times weekly   Drug use: No   Sexual activity: Not on file  Other Topics Concern   Not on file  Social History Narrative   Not on file   Social Determinants of Health   Financial Resource Strain: Not on file  Food Insecurity: Not on file  Transportation Needs: Not on file  Physical Activity: Not on file  Stress: Not on file  Social Connections: Not on file     Family History: The patient's family history includes Cancer in his mother.  ROS:   Review of Systems  Constitutional: Negative.   HENT: Negative.    Eyes: Negative.   Respiratory:  Positive for shortness of breath. Negative for cough, hemoptysis, sputum production and wheezing.        See HPI.  Cardiovascular: Negative.   Gastrointestinal: Negative.  History of swelling in abdomen.  See HPI.  Genitourinary: Negative.        Minimal urine production.  He is hemodialysis patient.  See HPI.  Musculoskeletal: Negative.   Skin: Negative.        History of lupus.  See HPI.  Neurological: Negative.   Endo/Heme/Allergies: Negative.   Psychiatric/Behavioral: Negative.      Please see the history of present illness.    All other systems reviewed and are negative.  EKGs/Labs/Other Studies Reviewed:    The following studies were reviewed today:   EKG:  EKG is ordered today.  The ekg ordered today demonstrates sinus tachycardia, 103 bpm,  LVH, nonspecific ST/T wave abnormality, possible left atrial enlargement, otherwise nothing acute.   2D echocardiogram on April 25, 2021: 1. Left ventricular ejection fraction, by estimation, is 55 to 60%. The  left ventricle has normal function. The left ventricle has no regional  wall motion abnormalities. There is severe concentric left ventricular  hypertrophy. Left ventricular diastolic   parameters are indeterminate. Elevated left ventricular end-diastolic  pressure.   2. Right ventricular  systolic function is normal. The right ventricular  size is normal. Tricuspid regurgitation signal is inadequate for assessing  PA pressure.   3. There is moderate calcification of the aortic valve leaflet with focal  calcification at the tip of the noncoronary cusp. The aortic valve is  tricuspid. Aortic valve regurgitation is trivial. Aortic valve  sclerosis/calcification is present, without  any evidence of aortic stenosis. Aortic valve area, by VTI measures 3.30  cm. Aortic valve mean gradient measures 3.0 mmHg. Aortic valve Vmax  measures 1.17 m/s.   4. A small pericardial effusion is present. The pericardial effusion is  posterior to the left ventricle.   5. The mitral valve is normal in structure. Trivial mitral valve  regurgitation. No evidence of mitral stenosis.   6. The inferior vena cava is normal in size with greater than 50%  respiratory variability, suggesting right atrial pressure of 3 mmHg.   Lexiscan on December 14, 2019: There was no ST segment deviation noted during stress. Nuclear stress EF: 47%. The left ventricular ejection fraction is mildly decreased (45-54%). The study is normal. This is an intermediate risk study.   Intermediate risk study due to decreased systolic function (EF 32%).  Normal perfusion. Recent Labs: 06/28/2021: ALT 18 11/28/2021: Brain Natriuretic Peptide 4,030; Hemoglobin 10.6; Platelets 126; TSH 5.66 01/02/2022: BUN 59; Creatinine, Ser 8.82; Potassium 4.0; Pro B Natriuretic peptide (BNP) >4923.0; Sodium 134  Recent Lipid Panel    Component Value Date/Time   CHOL 132 11/28/2021 1441   TRIG 44 11/28/2021 1441   HDL 61 11/28/2021 1441   CHOLHDL 2.2 11/28/2021 1441   VLDL 25.2 11/01/2018 1156   LDLCALC 58 11/28/2021 1441   LDLDIRECT 39.0 11/01/2018 1156     Risk Assessment/Calculations:     The 10-year ASCVD risk score (Arnett DK, et al., 2019) is: 3.1%   Values used to calculate the score:     Age: 62 years     Sex: Male     Is  Non-Hispanic African American: No     Diabetic: No     Tobacco smoker: No     Systolic Blood Pressure: 202 mmHg     Is BP treated: Yes     HDL Cholesterol: 61 mg/dL     Total Cholesterol: 132 mg/dL            Physical Exam:    VS:  BP (!) 150/99 (BP Location: Left Arm, Patient Position: Sitting, Cuff Size: Normal)   Pulse (!) 103   Ht 5' 11.5" (1.816 m)   Wt 170 lb (77.1 kg)   SpO2 94%   BMI 23.38 kg/m     Wt Readings from Last 3 Encounters:  01/07/22 170 lb (77.1 kg)  01/02/22 176 lb 9.6 oz (80.1 kg)  11/28/21 169 lb 6.4 oz (76.8 kg)    Vitals:   01/07/22 1319 01/07/22 1335  BP: (!) 166/98 (!) 150/99  Pulse: (!) 103   SpO2: 94%     GEN: Well nourished, well developed in no acute distress; does not appear ill-appearing HEENT: Normal NECK: No JVD; No carotid bruits CARDIAC: S1/S2, fast heart rate, regular rhythm, no murmurs, rubs, gallops; 2+ peripheral pulses throughout, strong and equal bilaterally.  AV fistula noted along left anterior forearm, positive for thrill and bruit. RESPIRATORY: Diminished with scattered crackles along posterior lung fields to auscultation without rales, wheezing or rhonchi; normal work of breathing at rest ABDOMEN: Soft,distended abdomen, nontender, bowel sounds x4 MUSCULOSKELETAL: Swelling to abdomen, otherwise no edema noted peripherally.; No deformity  SKIN: Warm and dry, scattered abrasions/ skin changes from lupus, overall pale appearance, otherwise normal NEUROLOGIC:  Alert and oriented x 3 PSYCHIATRIC:  Normal affect   ASSESSMENT:    1. SOB (shortness of breath)   2. Heart failure with preserved ejection fraction, unspecified HF chronicity ([place])   3. Pericardial effusion   4. SLE (systemic lupus erythematosus related syndrome) ([place])   5. ESRD (end stage renal disease) ([place])   6. Hypothyroidism, unspecified type   7. Hypertension, unspecified type    PLAN:    In order of problems listed above:  Shortness of breath -  recent, improving Was referred by PCP d/t ongoing SOB, elevated BP, and 7 lbs weight gain, elevated BNP. He admits to chronic SOB, not worsening. Etiology multifactorial. Has taken 40 mg of Lasix daily since last Friday, 9/8, and believes his symptoms have improved some d/t new medication added by PCP and getting HD. Hx of Lupus, pleural effusions, ascites, ESRD, HFpEF, and small pericardial effusion. Not ill-appearing. Not likely cardiac in etiology as he denies any chest pain, palpitations, tachycardia, fever, chills, lightheadedness, dizziness, hemoptysis, syncope, or presyncope, or recent orthopnea or PND. Does not appear PE-related, as he denies CP, fainting, cough with blood, or low blood pressure, not hypoxic on exam. He believes CXR isn't warranted as he believes there is some minimal fluid in his lungs and HD should help. Will refer to Pulmonology to r/o Lupus-associated SOB or other pulmonary etiologies, hx of Covid-19 in March 2023. Continue HD on T/Th/Sat. To r/o HFpEF associated SOB, plan to update 2D echo to evaluate overall heart and valvular function as well as pericardial effusion (see below) to r/o other cardiac causes for his SOB. ED precautions discussed.    2. HFpEF, pericardial effusion - chronic  Reports chronic SHOB, says he feels like this has improved some. Scattered diminished crackles on exam, otherwise no peripheral edema noted except for distended abdomen, soft, and nontender. Echo in 03/2021 revealed LVEF 55 to 60% with small pericardial effusion present posterior to the left ventricle. Update 2D echocardiogram to evaluate pericardial effusion and overall heart and valvular function. Do not suspect cardiac tamponade as no signs of Beck's Triad on exam. Thoracentesis hx.  Will refer to pulmonology d/t hx of lupus and recurrent history of shortness of breath as mentioned above. Low sodium diet, fluid restriction <2L,  and daily weights encouraged. Educated to contact our office for  weight gain of 2 lbs overnight or 5 lbs in one week. His PCP started Lasix, however, this medication may not be beneficial for him d/t hx of ESRD. Appreciate input from Nephrology and will evaluate what updated 2D echo reveals.   2. SLE - chronic, stable This has been chronic.  Continue to follow-up with PCP and rheumatology.  Continue current medication regimen.  3.  End-stage renal disease due to lupus-chronic, stable He is receiving hemodialysis on Tuesday, Thursday, and Saturday.  Continue to receive dialysis treatments.  Continue to follow-up with PCP and Nephrology. Appreciate input from Nephrology as mentioned above.   4.  Hypothyroidism - chronic, stable This is being managed by his PCP.  TSH 5.660 in August 2023.  Continue levothyroxine.  Continue follow-up with PCP.  5. HTN - chronic, not at goal of SBP < 130 BP on arrival 166/98, repeat manual 150/99.  BP at home same.  Initiate carvedilol 3.125 mg twice daily and increase irbesartan from 150 mg daily to 300 mg daily. Discussed to monitor BP at home at least 2 hours after medications and sitting for 5-10 minutes.  He will send me BP readings within 2 weeks, if BP is not at goal and heart rate allows, plan to increase carvedilol to 6.25 mg twice daily.  Discussed stopping ibuprofen as this could be causing blood pressure elevations and using Tylenol as needed as needed for pain. Appreciate input from Nephrology. Given history of ESRD, if BP is not improved by next week (I plan to call him), then I will plan to initiate Bidil and adjust GDMT if patient is agreeable.   Addendum 02/02/22: Since patient's last OV, I called this patient several times and was unable to reach him. I have sent several MyChart messages after his last appointment and nursing staff have been unable to reach him. He will follow-up via virtual visit on 02/12/22.   5.  Disposition: Follow-up with me or APP via virtual visit in 5 weeks or sooner if anything  changes.    Medication Adjustments/Labs and Tests Ordered: Current medicines are reviewed at length with the patient today.  Concerns regarding medicines are outlined above.  Orders Placed This Encounter  Procedures   Ambulatory referral to Pulmonology   EKG 12-Lead   ECHOCARDIOGRAM COMPLETE   Meds ordered this encounter  Medications   carvedilol (COREG) 3.125 MG tablet    Sig: Take 1 tablet (3.125 mg total) by mouth 2 (two) times daily.    Dispense:  180 tablet    Refill:  3   irbesartan (AVAPRO) 300 MG tablet    Sig: Take 1 tablet (300 mg total) by mouth daily.    Dispense:  90 tablet    Refill:  3    Patient Instructions  Medication Instructions:  Your physician has recommended you make the following change in your medication:  START: Carvedilol 3.125mg  twice daily.  INCREASE: Irbesartan 300mg  daily.  *If you need a refill on your cardiac medications before your next appointment, please call your pharmacy*  Please take your blood pressure daily for 2 weeks and send in a MyChart message. Please include heart rates.   HOW TO TAKE YOUR BLOOD PRESSURE: Rest 5 minutes before taking your blood pressure. Don't smoke or drink caffeinated beverages for at least 30 minutes before. Take your blood pressure before (not after) you eat. Sit comfortably with your back supported and both feet on the floor (  don't cross your legs). Elevate your arm to heart level on a table or a desk. Use the proper sized cuff. It should fit smoothly and snugly around your bare upper arm. There should be enough room to slip a fingertip under the cuff. The bottom edge of the cuff should be 1 inch above the crease of the elbow. Ideally, take 3 measurements at one sitting and record the average.    Lab Work: NONE ordered at this time of appointment   If you have labs (blood work) drawn today and your tests are completely normal, you will receive your results only by: [facility] (if you have MyChart)  OR A paper copy in the mail If you have any lab test that is abnormal or we need to change your treatment, we will call you to review the results.   Testing/Procedures: Your physician has requested that you have an echocardiogram. Echocardiography is a painless test that uses sound waves to create images of your heart. It provides your doctor with information about the size and shape of your heart and how well your heart's chambers and valves are working. This procedure takes approximately one hour. There are no restrictions for this procedure.    Follow-Up: At [facility], you and your health needs are our priority.  As part of our continuing mission to provide you with exceptional heart care, we have created designated Provider Care Teams.  These Care Teams include your primary Cardiologist (physician) and Advanced Practice Providers (APPs -  Physician Assistants and Nurse Practitioners) who all work together to provide you with the care you need, when you need it.  We recommend signing up for the patient portal called "MyChart".  Sign up information is provided on this After Visit Summary.  MyChart is used to connect with patients for Virtual Visits (Telemedicine).  Patients are able to view lab/test results, encounter notes, upcoming appointments, etc.  Non-urgent messages can be sent to your provider as well.   To learn more about what you can do with MyChart, go to [email or web address].    Your next appointment:   5 week(s)  The format for your next appointment:   Virtual Visit   Provider:   [name], NP    Signed, [name], NP  01/09/2022 7:19 PM    [place]

## 2022-01-10 DIAGNOSIS — N2581 Secondary hyperparathyroidism of renal origin: Secondary | ICD-10-CM | POA: Diagnosis not present

## 2022-01-10 DIAGNOSIS — Z992 Dependence on renal dialysis: Secondary | ICD-10-CM | POA: Diagnosis not present

## 2022-01-10 DIAGNOSIS — N186 End stage renal disease: Secondary | ICD-10-CM | POA: Diagnosis not present

## 2022-01-13 DIAGNOSIS — N2581 Secondary hyperparathyroidism of renal origin: Secondary | ICD-10-CM | POA: Diagnosis not present

## 2022-01-13 DIAGNOSIS — N186 End stage renal disease: Secondary | ICD-10-CM | POA: Diagnosis not present

## 2022-01-13 DIAGNOSIS — Z992 Dependence on renal dialysis: Secondary | ICD-10-CM | POA: Diagnosis not present

## 2022-01-15 DIAGNOSIS — Z992 Dependence on renal dialysis: Secondary | ICD-10-CM | POA: Diagnosis not present

## 2022-01-15 DIAGNOSIS — N186 End stage renal disease: Secondary | ICD-10-CM | POA: Diagnosis not present

## 2022-01-15 DIAGNOSIS — N2581 Secondary hyperparathyroidism of renal origin: Secondary | ICD-10-CM | POA: Diagnosis not present

## 2022-01-16 ENCOUNTER — Ambulatory Visit: Payer: BC Managed Care – PPO | Admitting: Family Medicine

## 2022-01-16 ENCOUNTER — Telehealth: Payer: Self-pay | Admitting: Family Medicine

## 2022-01-16 NOTE — Telephone Encounter (Signed)
Pt was a no show 9/22 for an OV with Dr. Ethelene Hal. This is his first, letter has been sent out

## 2022-01-17 DIAGNOSIS — N186 End stage renal disease: Secondary | ICD-10-CM | POA: Diagnosis not present

## 2022-01-17 DIAGNOSIS — Z992 Dependence on renal dialysis: Secondary | ICD-10-CM | POA: Diagnosis not present

## 2022-01-17 DIAGNOSIS — N2581 Secondary hyperparathyroidism of renal origin: Secondary | ICD-10-CM | POA: Diagnosis not present

## 2022-01-19 NOTE — Telephone Encounter (Signed)
1st no show, fee waived, letter sent 

## 2022-01-20 DIAGNOSIS — N186 End stage renal disease: Secondary | ICD-10-CM | POA: Diagnosis not present

## 2022-01-20 DIAGNOSIS — N2581 Secondary hyperparathyroidism of renal origin: Secondary | ICD-10-CM | POA: Diagnosis not present

## 2022-01-20 DIAGNOSIS — Z992 Dependence on renal dialysis: Secondary | ICD-10-CM | POA: Diagnosis not present

## 2022-01-21 ENCOUNTER — Ambulatory Visit (HOSPITAL_COMMUNITY): Payer: BC Managed Care – PPO | Attending: Nurse Practitioner

## 2022-01-21 DIAGNOSIS — I5030 Unspecified diastolic (congestive) heart failure: Secondary | ICD-10-CM | POA: Diagnosis not present

## 2022-01-22 DIAGNOSIS — N2581 Secondary hyperparathyroidism of renal origin: Secondary | ICD-10-CM | POA: Diagnosis not present

## 2022-01-22 DIAGNOSIS — Z992 Dependence on renal dialysis: Secondary | ICD-10-CM | POA: Diagnosis not present

## 2022-01-22 DIAGNOSIS — N186 End stage renal disease: Secondary | ICD-10-CM | POA: Diagnosis not present

## 2022-01-22 LAB — ECHOCARDIOGRAM COMPLETE
Area-P 1/2: 4.17 cm2
P 1/2 time: 397 msec
S' Lateral: 4.1 cm

## 2022-01-23 ENCOUNTER — Encounter (HOSPITAL_BASED_OUTPATIENT_CLINIC_OR_DEPARTMENT_OTHER): Payer: Self-pay

## 2022-01-24 DIAGNOSIS — M3214 Glomerular disease in systemic lupus erythematosus: Secondary | ICD-10-CM | POA: Diagnosis not present

## 2022-01-24 DIAGNOSIS — N2581 Secondary hyperparathyroidism of renal origin: Secondary | ICD-10-CM | POA: Diagnosis not present

## 2022-01-24 DIAGNOSIS — N186 End stage renal disease: Secondary | ICD-10-CM | POA: Diagnosis not present

## 2022-01-24 DIAGNOSIS — Z992 Dependence on renal dialysis: Secondary | ICD-10-CM | POA: Diagnosis not present

## 2022-01-27 DIAGNOSIS — Z992 Dependence on renal dialysis: Secondary | ICD-10-CM | POA: Diagnosis not present

## 2022-01-27 DIAGNOSIS — N186 End stage renal disease: Secondary | ICD-10-CM | POA: Diagnosis not present

## 2022-01-27 DIAGNOSIS — N2581 Secondary hyperparathyroidism of renal origin: Secondary | ICD-10-CM | POA: Diagnosis not present

## 2022-01-29 DIAGNOSIS — N2581 Secondary hyperparathyroidism of renal origin: Secondary | ICD-10-CM | POA: Diagnosis not present

## 2022-01-29 DIAGNOSIS — N186 End stage renal disease: Secondary | ICD-10-CM | POA: Diagnosis not present

## 2022-01-29 DIAGNOSIS — Z992 Dependence on renal dialysis: Secondary | ICD-10-CM | POA: Diagnosis not present

## 2022-01-31 DIAGNOSIS — Z992 Dependence on renal dialysis: Secondary | ICD-10-CM | POA: Diagnosis not present

## 2022-01-31 DIAGNOSIS — N2581 Secondary hyperparathyroidism of renal origin: Secondary | ICD-10-CM | POA: Diagnosis not present

## 2022-01-31 DIAGNOSIS — N186 End stage renal disease: Secondary | ICD-10-CM | POA: Diagnosis not present

## 2022-01-31 IMAGING — CT CT ABD-PELV W/O CM
2 of 4 series · 14 of 36 positions shown, 17 images · non-contrast
Comparison: None.

CLINICAL DATA: Congestive heart failure, tachycardia, dyspnea,
abdominal distension, lower extremity edema

EXAM:
CT CHEST, ABDOMEN AND PELVIS WITHOUT CONTRAST
TECHNIQUE: Multidetector CT imaging of the chest, abdomen and pelvis was
performed following the standard protocol without IV contrast.

[Series 2: cap w/o · axial · non-contrast · 0.91mm/px · z∈[-612,-47]mm · 11 of 135 slices shown, 14 images]
[im 11/135  mediastinal]
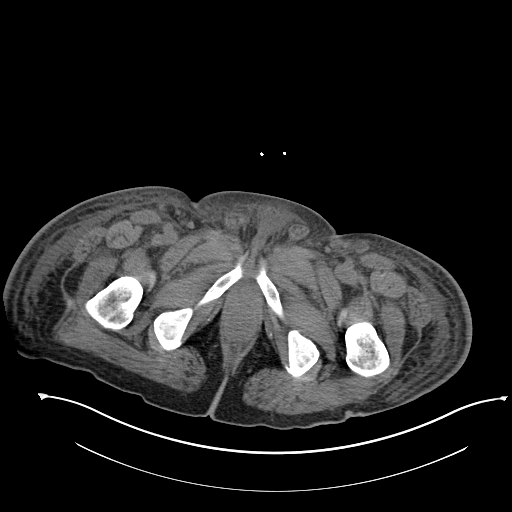
[im 11/135  lung]
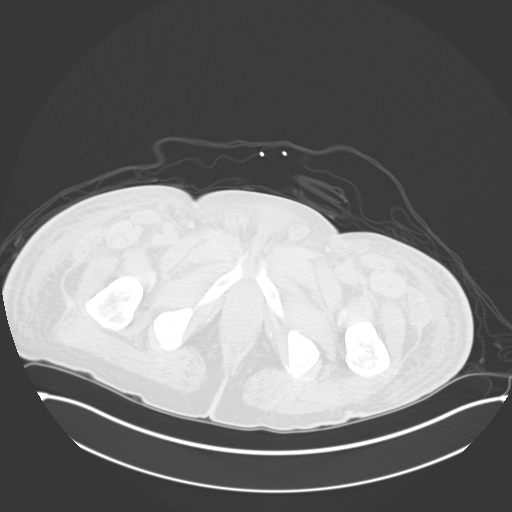
[im 21/135  lung]
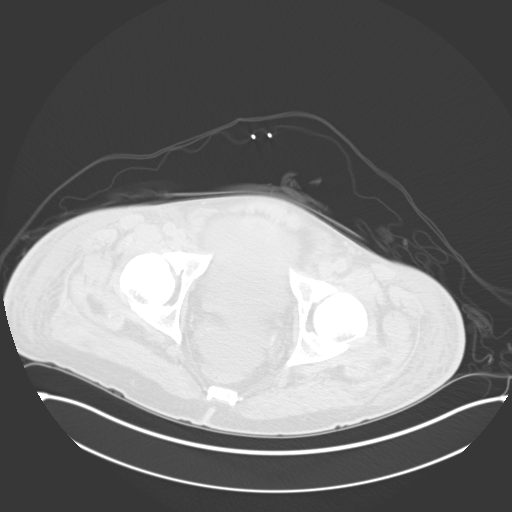
[im 31/135  lung]
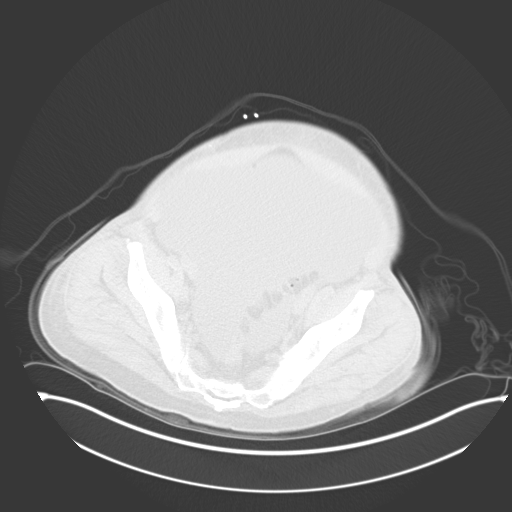
[im 42/135  lung]
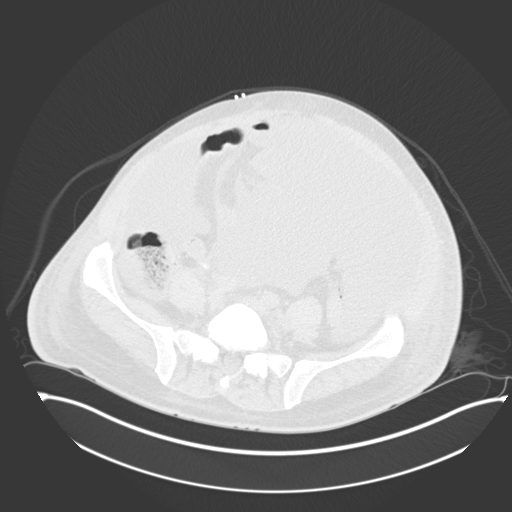
[im 52/135  mediastinal]
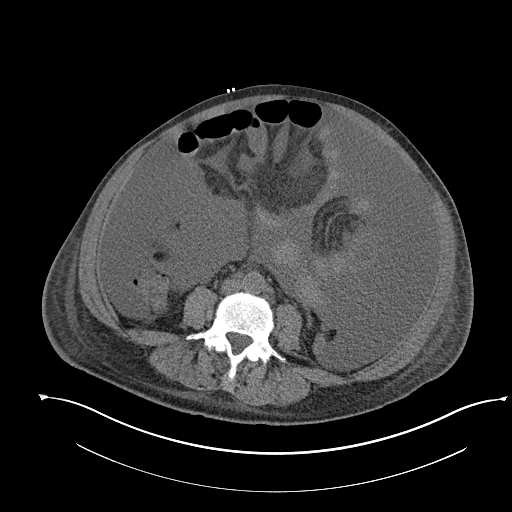
[im 52/135  lung]
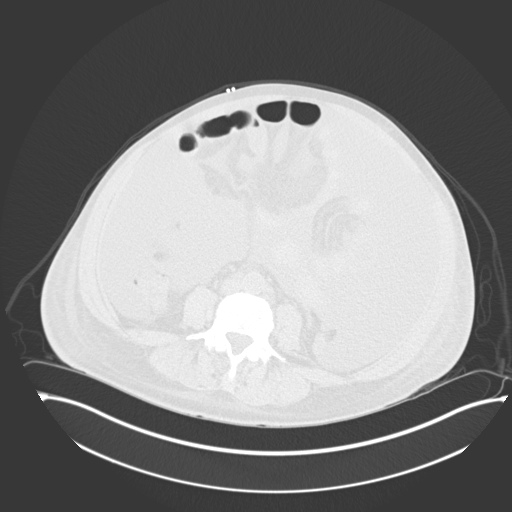
[im 73/135  lung]
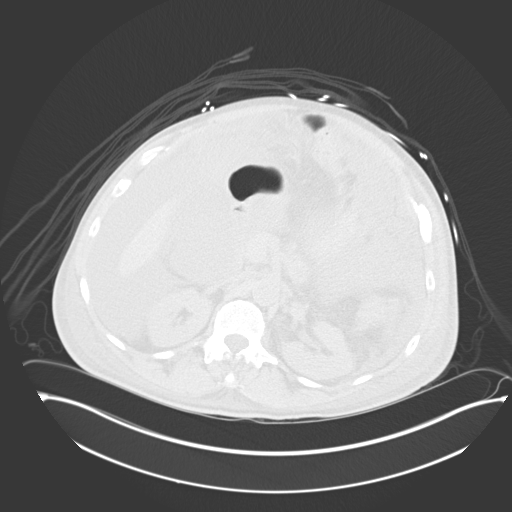
[im 83/135  lung]
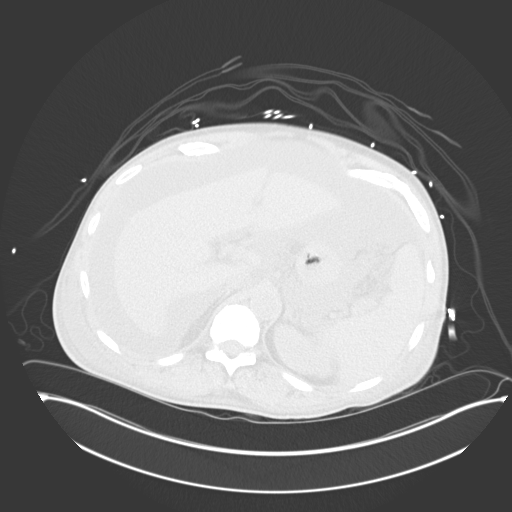
[im 93/135  lung]
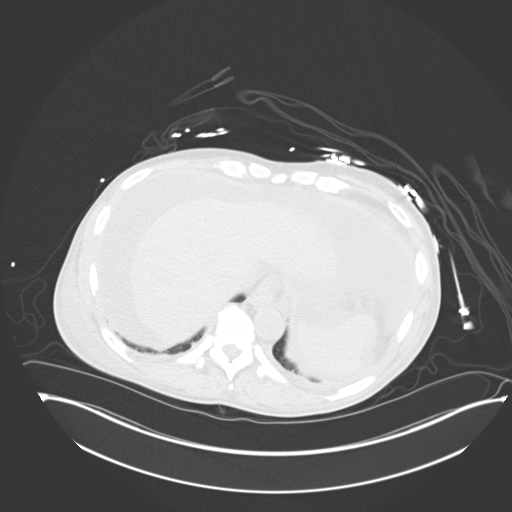
[im 104/135  mediastinal]
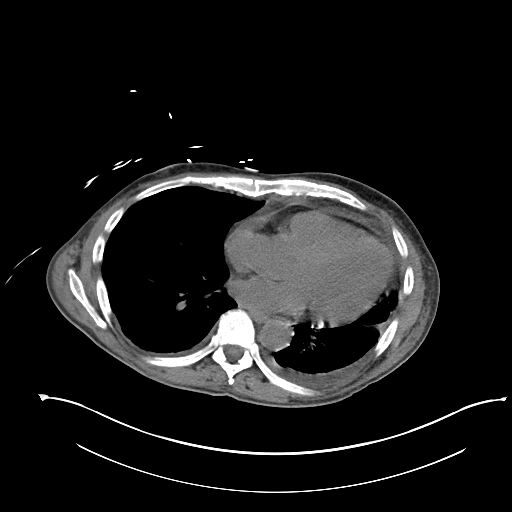
[im 104/135  lung]
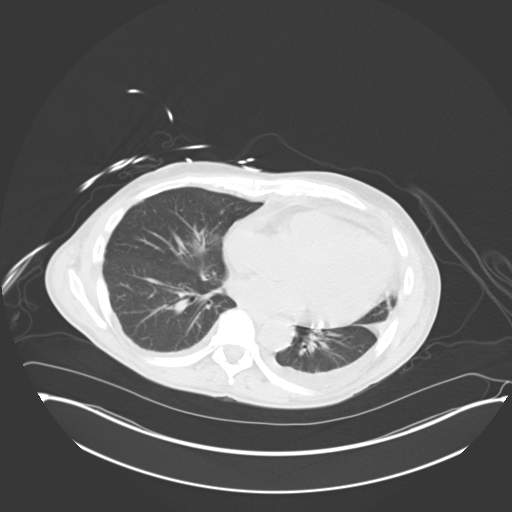
[im 114/135  lung]
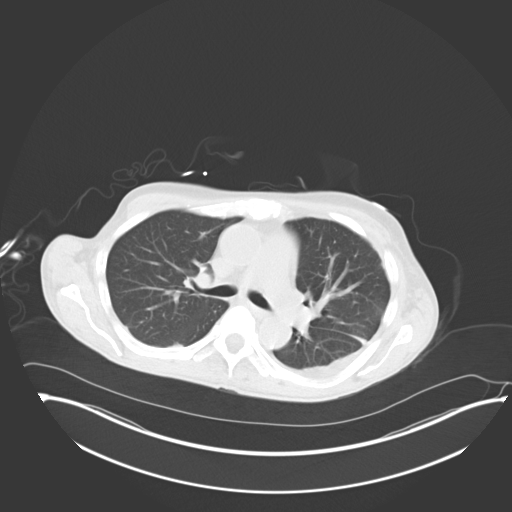
[im 124/135  lung]
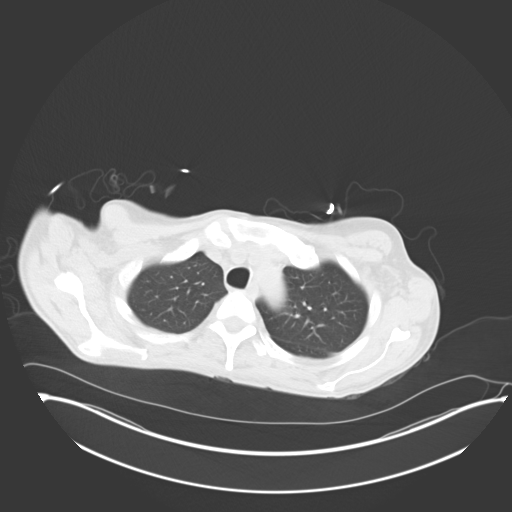

[Series 5: coronals · coronal · 0.89mm/px · 3 of 172 slices shown]
[im 35/172  lung]
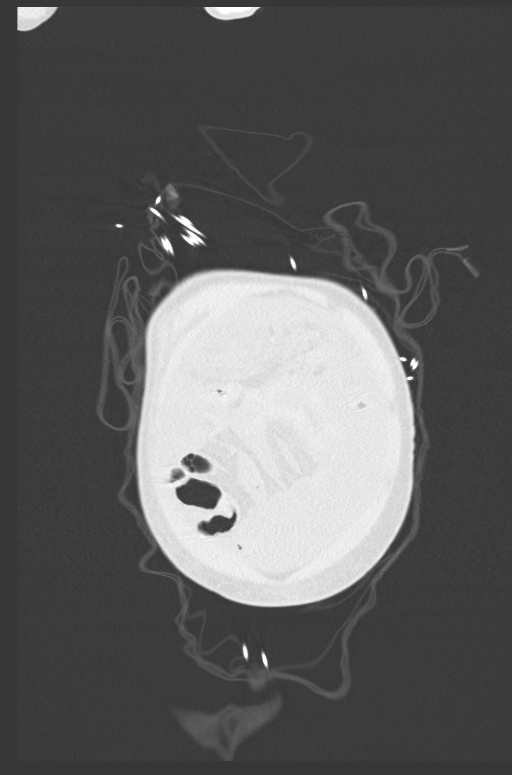
[im 69/172  lung]
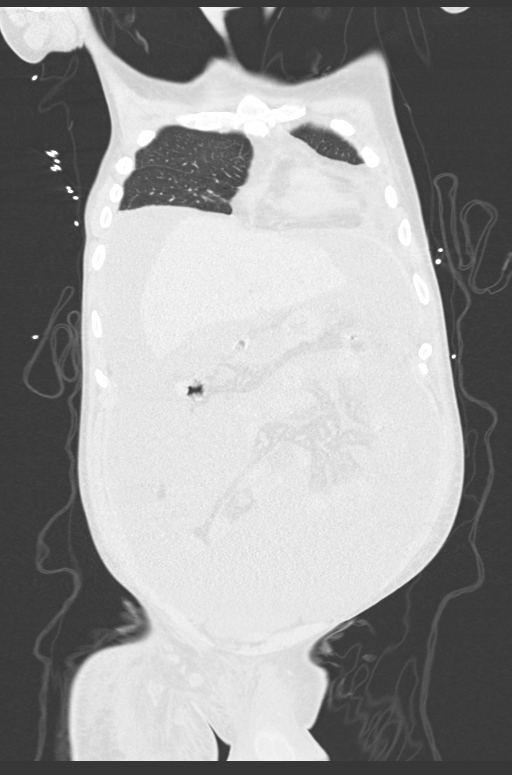
[im 103/172  lung]
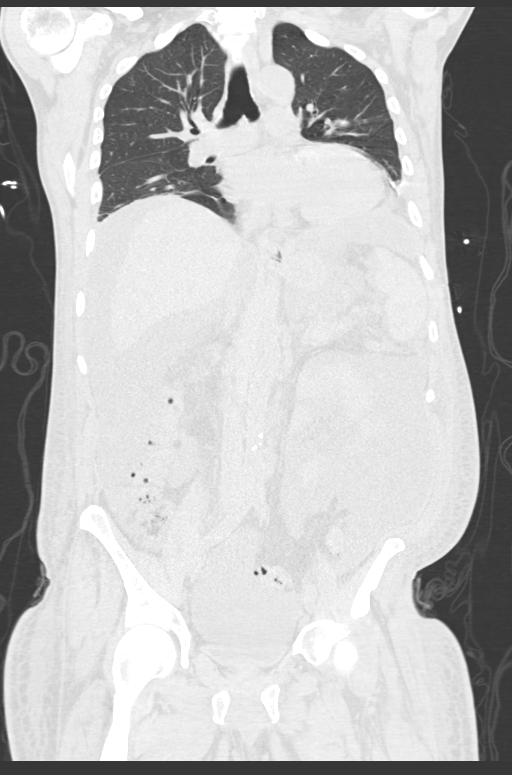

[14 of 36 positions shown; findings below may reference images not displayed]

FINDINGS: CT CHEST FINDINGS

Cardiovascular: Mild coronary artery calcification. Global cardiac
size within normal limits. Small pericardial effusion. No CT
evidence of cardiac tamponade. Central pulmonary arteries are of
normal caliber. Thoracic aorta is unremarkable.

Mediastinum/Nodes: Visualized thyroid unremarkable. No pathologic
thoracic adenopathy. Esophagus unremarkable.

Lungs/Pleura: Trace right and small left pleural effusions are
present with mild bibasilar atelectasis. Lungs are otherwise clear.
No pneumothorax. Central airways are widely patent.

Musculoskeletal: No acute bone abnormality. No lytic or blastic bone
lesion.

CT ABDOMEN PELVIS FINDINGS

Hepatobiliary: No focal liver abnormality is seen. No gallstones,
gallbladder wall thickening, or biliary dilatation.

Pancreas: Unremarkable

Spleen: Unremarkable

Adrenals/Urinary Tract: Adrenal glands are unremarkable. Kidneys are
normal, without renal calculi, focal lesion, or hydronephrosis.
Bladder is unremarkable.

Stomach/Bowel: Moderate descending and sigmoid colonic
diverticulosis. Stomach, small bowel, and large bowel are otherwise
unremarkable. Appendix normal. No free intraperitoneal gas. Large
volume ascites is present.

Vascular/Lymphatic: No significant vascular findings are present. No
enlarged abdominal or pelvic lymph nodes.

Reproductive: Prostate is unremarkable.

Other: Rectum unremarkable.  No abdominal wall hernia.

Musculoskeletal: Degenerative changes are seen at the lumbosacral
junction. No lytic or blastic bone lesion is identified. No acute
bone abnormality. There is moderate subcutaneous edema noted within
the flanks and within the visualized lower extremities bilaterally.
IMPRESSION: Large volume ascites and moderate subcutaneous edema involving the
flanks and visualized lower extremities bilaterally demonstrating a
dependent, lower extremity gradient. While this can be seen in the
setting of right heart failure, this is nonspecific.

Trace bilateral pleural effusions.

Moderate sigmoid diverticulosis.

## 2022-02-01 IMAGING — US US PARACENTESIS
1 series · 6 of 6 positions shown · non-contrast
Comparison: none

INDICATION: Patient with history of SLE, acute HF in fluid overload, abdominal
distension, and ascites. Request is made for diagnostic and
therapeutic paracentesis up to 2 L.

[Series 1: us paracentesis · 6 of 6 slices shown]
[im 1/6]
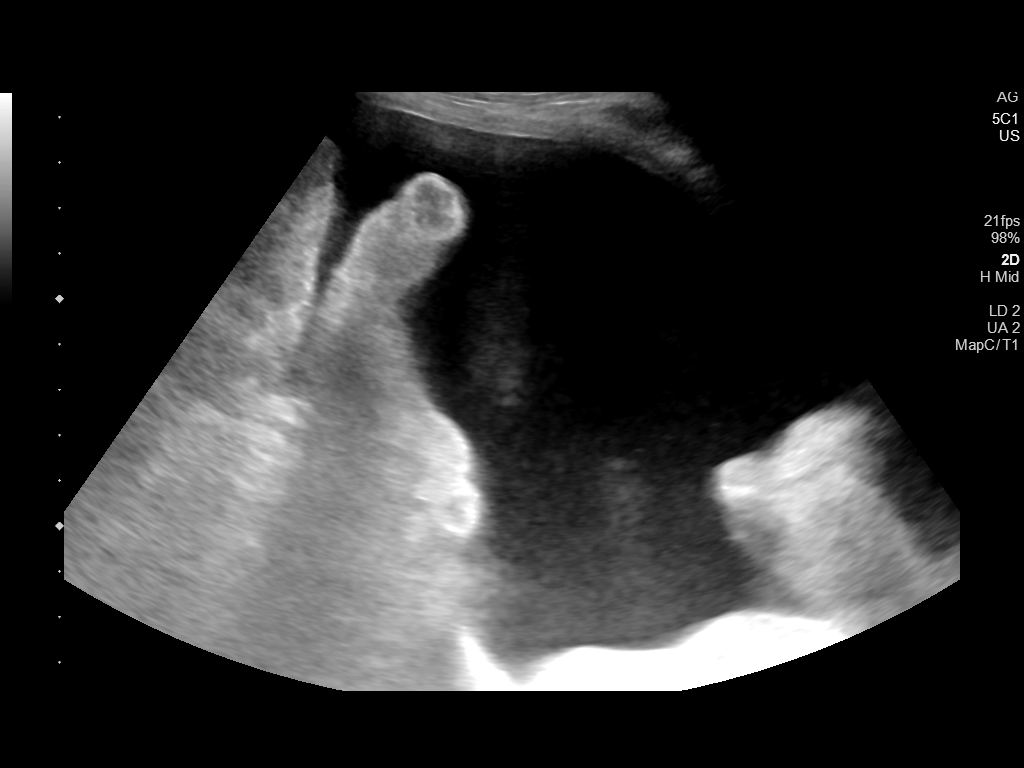
[im 2/6]
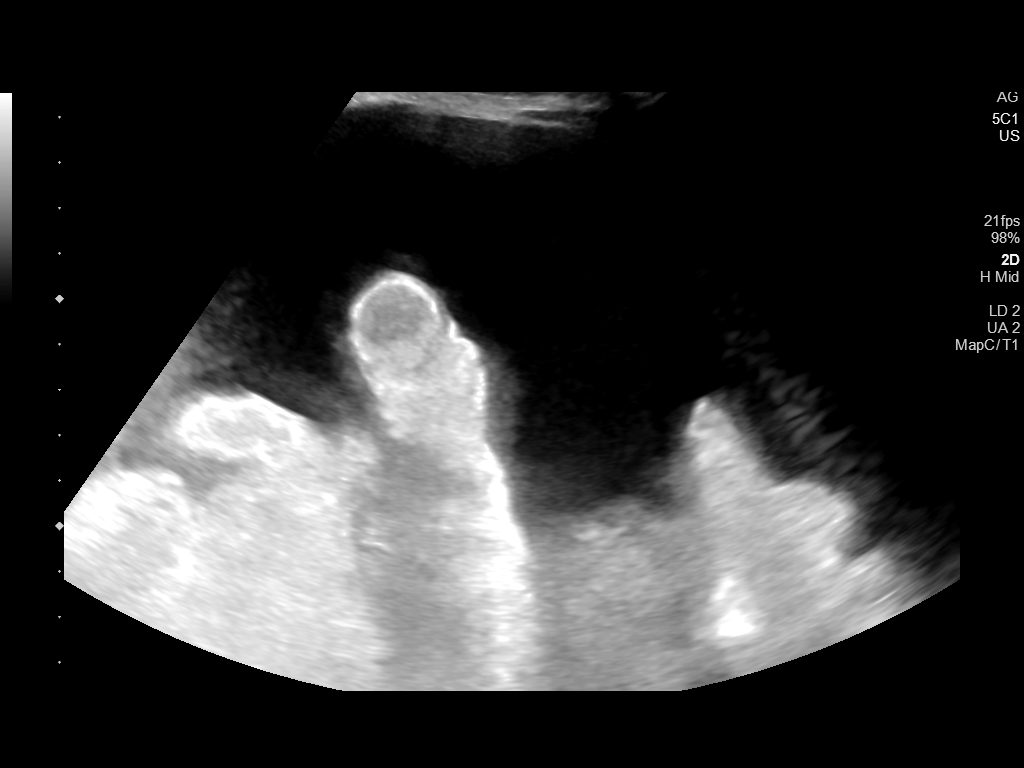
[im 3/6]
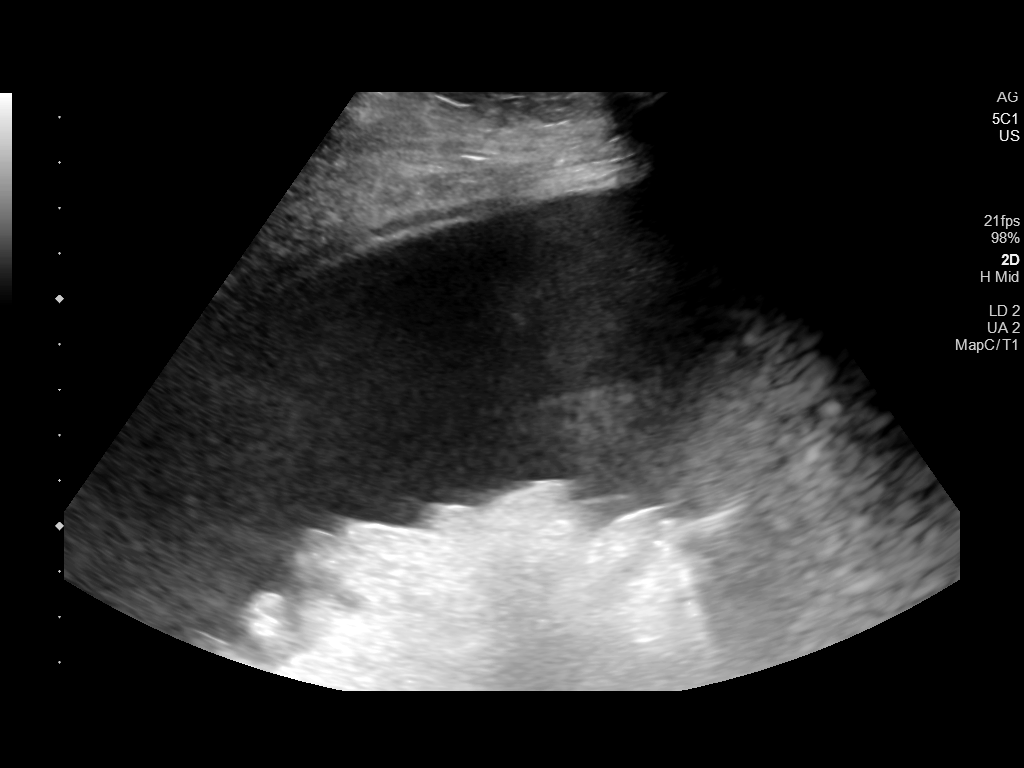
[im 4/6]
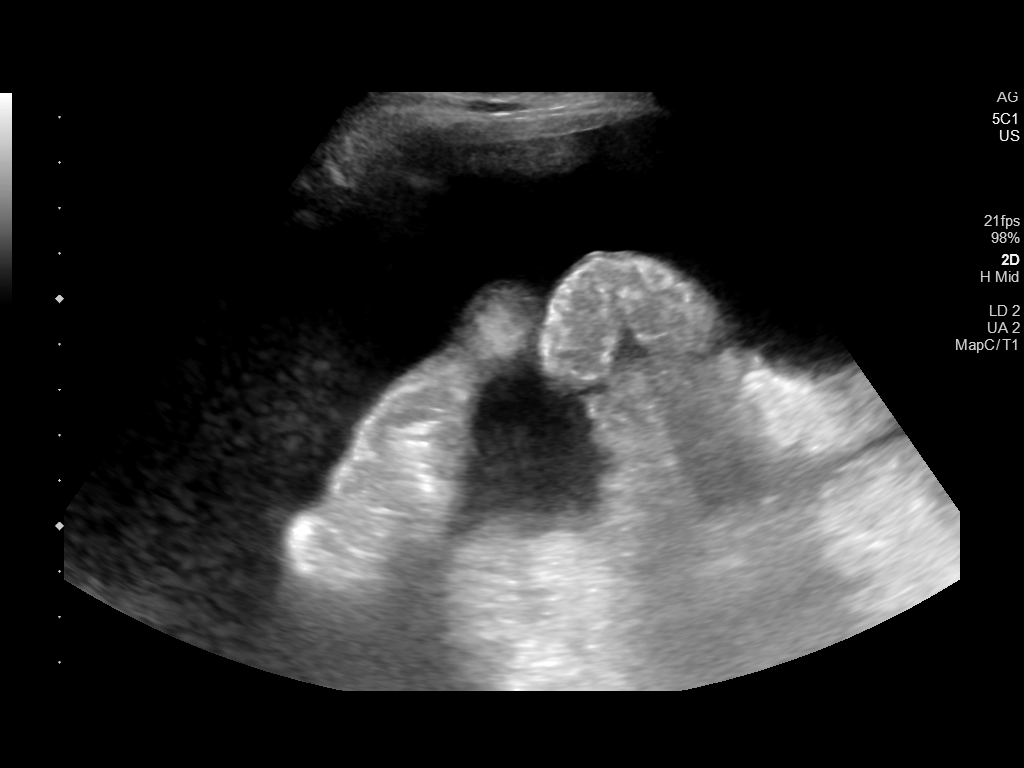
[im 5/6]
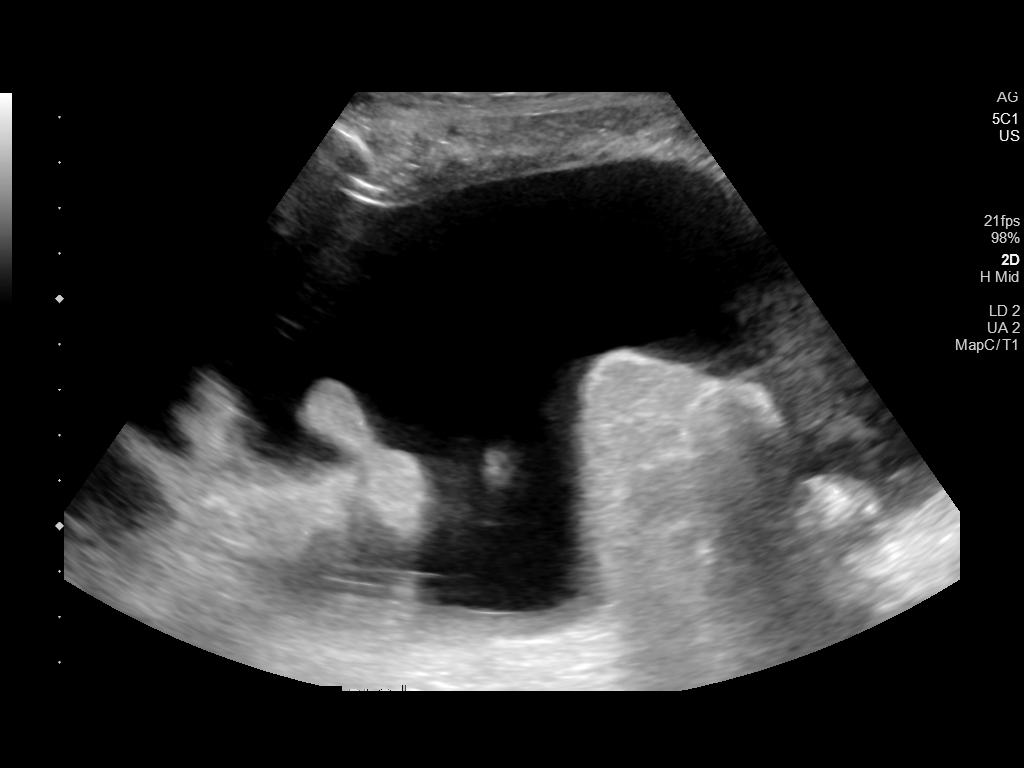
[im 6/6]
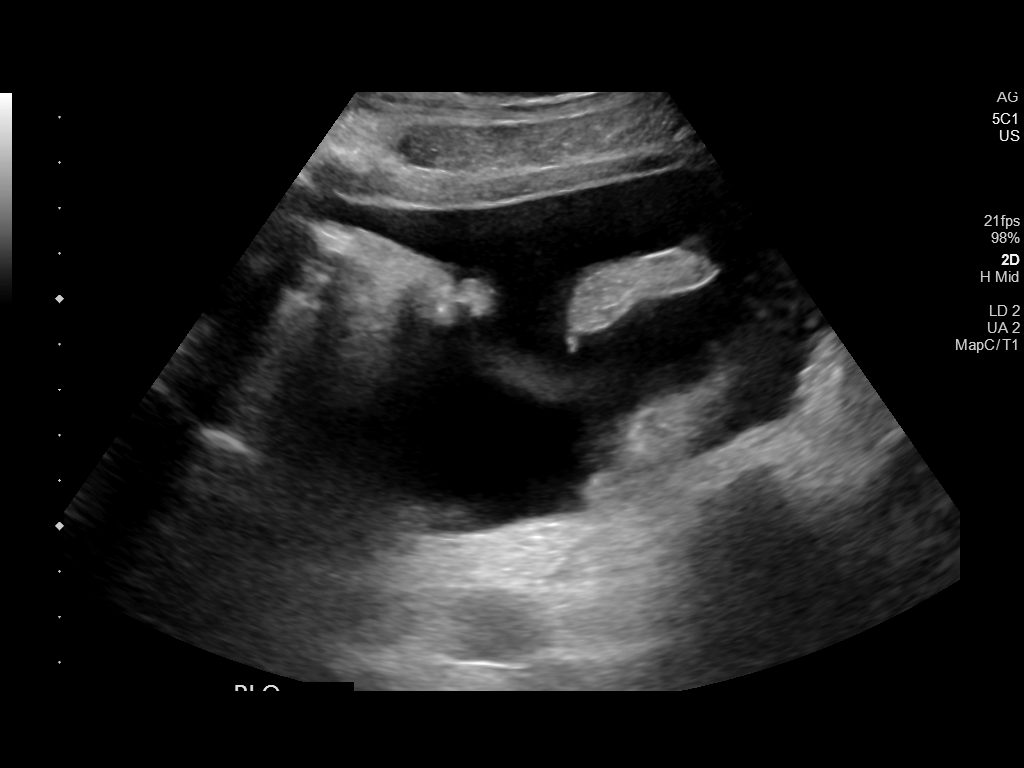

[6 of 6 positions shown; findings below may reference images not displayed]

EXAM:
ULTRASOUND GUIDED DIAGNOSTIC AND THERAPEUTIC PARACENTESIS

MEDICATIONS:
10 mL 1% lidocaine

COMPLICATIONS:
None immediate.

PROCEDURE:
Informed written consent was obtained from the patient after a
discussion of the risks, benefits and alternatives to treatment. A
timeout was performed prior to the initiation of the procedure.

Initial ultrasound scanning demonstrates a large amount of ascites
within the right lower abdominal quadrant. The right lower abdomen
was prepped and draped in the usual sterile fashion. 1% lidocaine
was used for local anesthesia.

Following this, a 19 gauge, 7-cm, Yueh catheter was introduced. An
ultrasound image was saved for documentation purposes. The
paracentesis was performed. The catheter was removed and a dressing
was applied. The patient tolerated the procedure well without
immediate post procedural complication.
Patient received post-procedure intravenous albumin; see nursing
notes for details.
FINDINGS: A total of approximately 2 L of clear yellow fluid was removed.
Samples were sent to the laboratory as requested by the clinical
team.
IMPRESSION: Successful ultrasound-guided paracentesis yielding 2 L of peritoneal
fluid.

## 2022-02-03 DIAGNOSIS — Z23 Encounter for immunization: Secondary | ICD-10-CM | POA: Diagnosis not present

## 2022-02-03 DIAGNOSIS — Z992 Dependence on renal dialysis: Secondary | ICD-10-CM | POA: Diagnosis not present

## 2022-02-03 DIAGNOSIS — N186 End stage renal disease: Secondary | ICD-10-CM | POA: Diagnosis not present

## 2022-02-03 DIAGNOSIS — N2581 Secondary hyperparathyroidism of renal origin: Secondary | ICD-10-CM | POA: Diagnosis not present

## 2022-02-05 DIAGNOSIS — Z23 Encounter for immunization: Secondary | ICD-10-CM | POA: Diagnosis not present

## 2022-02-05 DIAGNOSIS — Z992 Dependence on renal dialysis: Secondary | ICD-10-CM | POA: Diagnosis not present

## 2022-02-05 DIAGNOSIS — N186 End stage renal disease: Secondary | ICD-10-CM | POA: Diagnosis not present

## 2022-02-05 DIAGNOSIS — N2581 Secondary hyperparathyroidism of renal origin: Secondary | ICD-10-CM | POA: Diagnosis not present

## 2022-02-07 DIAGNOSIS — Z992 Dependence on renal dialysis: Secondary | ICD-10-CM | POA: Diagnosis not present

## 2022-02-07 DIAGNOSIS — Z23 Encounter for immunization: Secondary | ICD-10-CM | POA: Diagnosis not present

## 2022-02-07 DIAGNOSIS — N186 End stage renal disease: Secondary | ICD-10-CM | POA: Diagnosis not present

## 2022-02-07 DIAGNOSIS — N2581 Secondary hyperparathyroidism of renal origin: Secondary | ICD-10-CM | POA: Diagnosis not present

## 2022-02-10 DIAGNOSIS — N2581 Secondary hyperparathyroidism of renal origin: Secondary | ICD-10-CM | POA: Diagnosis not present

## 2022-02-10 DIAGNOSIS — Z992 Dependence on renal dialysis: Secondary | ICD-10-CM | POA: Diagnosis not present

## 2022-02-10 DIAGNOSIS — N186 End stage renal disease: Secondary | ICD-10-CM | POA: Diagnosis not present

## 2022-02-12 ENCOUNTER — Telehealth (HOSPITAL_BASED_OUTPATIENT_CLINIC_OR_DEPARTMENT_OTHER): Payer: BC Managed Care – PPO | Admitting: Family

## 2022-02-12 DIAGNOSIS — N186 End stage renal disease: Secondary | ICD-10-CM | POA: Diagnosis not present

## 2022-02-12 DIAGNOSIS — N2581 Secondary hyperparathyroidism of renal origin: Secondary | ICD-10-CM | POA: Diagnosis not present

## 2022-02-12 DIAGNOSIS — Z992 Dependence on renal dialysis: Secondary | ICD-10-CM | POA: Diagnosis not present

## 2022-02-14 DIAGNOSIS — N2581 Secondary hyperparathyroidism of renal origin: Secondary | ICD-10-CM | POA: Diagnosis not present

## 2022-02-14 DIAGNOSIS — Z992 Dependence on renal dialysis: Secondary | ICD-10-CM | POA: Diagnosis not present

## 2022-02-14 DIAGNOSIS — N186 End stage renal disease: Secondary | ICD-10-CM | POA: Diagnosis not present

## 2022-02-17 DIAGNOSIS — N186 End stage renal disease: Secondary | ICD-10-CM | POA: Diagnosis not present

## 2022-02-17 DIAGNOSIS — Z992 Dependence on renal dialysis: Secondary | ICD-10-CM | POA: Diagnosis not present

## 2022-02-17 DIAGNOSIS — N2581 Secondary hyperparathyroidism of renal origin: Secondary | ICD-10-CM | POA: Diagnosis not present

## 2022-02-19 DIAGNOSIS — Z992 Dependence on renal dialysis: Secondary | ICD-10-CM | POA: Diagnosis not present

## 2022-02-19 DIAGNOSIS — N186 End stage renal disease: Secondary | ICD-10-CM | POA: Diagnosis not present

## 2022-02-19 DIAGNOSIS — N2581 Secondary hyperparathyroidism of renal origin: Secondary | ICD-10-CM | POA: Diagnosis not present

## 2022-02-21 DIAGNOSIS — Z992 Dependence on renal dialysis: Secondary | ICD-10-CM | POA: Diagnosis not present

## 2022-02-21 DIAGNOSIS — N2581 Secondary hyperparathyroidism of renal origin: Secondary | ICD-10-CM | POA: Diagnosis not present

## 2022-02-21 DIAGNOSIS — N186 End stage renal disease: Secondary | ICD-10-CM | POA: Diagnosis not present

## 2022-02-24 DIAGNOSIS — N186 End stage renal disease: Secondary | ICD-10-CM | POA: Diagnosis not present

## 2022-02-24 DIAGNOSIS — Z992 Dependence on renal dialysis: Secondary | ICD-10-CM | POA: Diagnosis not present

## 2022-02-24 DIAGNOSIS — N2581 Secondary hyperparathyroidism of renal origin: Secondary | ICD-10-CM | POA: Diagnosis not present

## 2022-02-24 DIAGNOSIS — M3214 Glomerular disease in systemic lupus erythematosus: Secondary | ICD-10-CM | POA: Diagnosis not present

## 2022-02-24 NOTE — Progress Notes (Signed)
Virtual Visit via Video Note   Because of [name]'s co-morbid illnesses, he is at least at moderate risk for complications without adequate follow up.  This format is felt to be most appropriate for this patient at this time.  All issues noted in this document were discussed and addressed.  A limited physical exam was performed with this format.  Please refer to the patient's chart for his consent to telehealth for [facility].  Date:  02/25/2022   ID:  [name], DOB 30-Dec-1970, MRN [id number] The patient was identified using 2 identifiers.  Patient Location: Home Provider Location: Office/Clinic   PCP:  [name], [place] Prov[facility] Cardiologist:  Jenean Lindau, MD     Evaluation Performed:  Follow-Up Visit  Chief Complaint:  follow up after echocardiogram  History of Present Illness:    [name] is a 51 y.o. male with hx of coronary calcification on CT, aortic atherosclerosis, HFpEF, SLE, cardiac murmur, hypothyroidism, HTN, etoh use, lupus nephritis with ESRD (HD T/Th/Sa), ascites with prior to paracentesis with no liver abnormality noted, diverticulosis without diverticulitis.   Initial evaluation 2021 for congestive heart failure with DOE and orthopnea which improved after diuresis. Echo 2021 LVEF 60-65%, no RWMA, moderately dilated LA/RA, trivial MR/AI, ascending aorta up to 10mm. Lexiscan no ischemia. Repeat echo 2022 LVEF 55-60%, moderate LVH, trivial MR, trivial AI, focal calcific at top of aortic valve. Echo 2022 LVEF 55-60%, severe LVH, elevated LVEDP, moderate calcification of oartic valve leaflet with focal calcification at tip of noncoronary cusp, trivial AI/MR, small pericardial effusion.   He had COVID 06/2021 with CT chest with no PE though did show small bilateral pleural effusion, atelectasis at lung bases, moderate ascites, splenomegaly, aortic and coronary artery calcification.  Evaluated by PCP  01/02/22 with dyspnea and orthopnea with 7 lbs weight gain and BNP >4,923. Seen 01/07/22 by [name], NP for evaluation of heart failure. Symptoms improved with Lasix prescribed by PCP and HD. Irbesartan increased and Carvedilol increased as BP not at goal. He was referred to pulmonology and echo updated. Echo 01/21/22 with newly reduced LVEF 35-40%, LV global hypokinesis, severe LVH, RVSF mildly reduced 54.8mmHg, moderately elevated PASP, LA moderately dilated, RA mildly dilated, mild MR, mild to moderate TR, mild AI, mild dilation aortic root 42mm.  He presents today for follow up via video visit. Notes BP at home has been most often 179-186/90. Notes exertional dyspnea which is stable. Notes some orthopnea. No chest pain, PND, edema. Does still make urine but also on HD three times per week. Last took Lasix 2-3 weeks ago and taking only PRN. Notes HD has been better about getting fluid off. Heart rate usually 90s-100s. Blood pressure not dropping during HD.    Past Medical History:  Diagnosis Date   Chronic kidney disease    Lupus ([place])    Thyroid disease    Past Surgical History:  Procedure Laterality Date   AV FISTULA PLACEMENT Left 08/26/2020   Procedure: LEFT RADIOCEPHALIC ARTERIOVENOUS (AV) FISTULA CREATION;  Surgeon: Angelia Mould, MD;  Location: [facility];  Service: Vascular;  Laterality: Left;   IR FLUORO GUIDE CV LINE RIGHT  04/30/2020   IR PARACENTESIS  05/07/2020   IR PARACENTESIS  05/10/2020   IR PARACENTESIS  06/03/2020   IR PARACENTESIS  06/24/2020   IR PARACENTESIS  07/15/2020   IR PARACENTESIS  07/29/2020   IR PARACENTESIS  08/14/2020   IR PARACENTESIS  06/11/2021   IR PARACENTESIS  08/08/2021   IR PARACENTESIS  09/12/2021   IR PARACENTESIS  10/10/2021   IR PERC TUN PERIT CATH WO PORT S&I /IMAG  05/03/2020   IR US GUIDE VASC ACCESS RIGHT  04/30/2020   IR US GUIDE VASC ACCESS RIGHT  05/03/2020     Current Meds  Medication Sig   hydroxychloroquine (PLAQUENIL) 200 MG tablet Take 200  mg by mouth 3 (three) times a week. No set days   irbesartan (AVAPRO) 300 MG tablet Take 1 tablet (300 mg total) by mouth daily.   levothyroxine (SYNTHROID) 50 MCG tablet Take 1 tablet (50 mcg total) by mouth daily before breakfast.   [DISCONTINUED] carvedilol (COREG) 3.125 MG tablet Take 1 tablet (3.125 mg total) by mouth 2 (two) times daily.     Allergies:   Patient has no known allergies.   Social History   Tobacco Use   Smoking status: Never   Smokeless tobacco: Never  Vaping Use   Vaping Use: Never used  Substance Use Topics   Alcohol use: Yes    Comment: 2-4 glasses of wine 2-3 times weekly   Drug use: No     Family Hx: The patient's family history includes Cancer in his mother.  ROS:   Please see the history of present illness.     All other systems reviewed and are negative.   Prior CV studies:   The following studies were reviewed today:  Echo 01/21/22  1. Left ventricular ejection fraction, by estimation, is 35 to 40%. The  left ventricle has moderately decreased function. The left ventricle  demonstrates global hypokinesis. There is severe concentric left  ventricular hypertrophy. Left ventricular  diastolic parameters are indeterminate.   2. Right ventricular systolic function is mildly reduced. The right  ventricular size is normal. There is moderately elevated pulmonary artery  systolic pressure. The estimated right ventricular systolic pressure is  26.7 mmHg.   3. Left atrial size was moderately dilated.   4. Right atrial size was mildly dilated.   5. The mitral valve is grossly normal. Mild mitral valve regurgitation.  No evidence of mitral stenosis.   6. Tricuspid valve regurgitation is mild to moderate.   7. The aortic valve is tricuspid. There is mild calcification of the  aortic valve. Aortic valve regurgitation is mild. No aortic stenosis is  present.   8. Aortic dilatation noted. There is mild dilatation of the aortic root,  measuring 43 mm.    9. The inferior vena cava is dilated in size with <50% respiratory  variability, suggesting right atrial pressure of 15 mmHg.    2D echocardiogram on April 25, 2021: 1. Left ventricular ejection fraction, by estimation, is 55 to 60%. The  left ventricle has normal function. The left ventricle has no regional  wall motion abnormalities. There is severe concentric left ventricular  hypertrophy. Left ventricular diastolic   parameters are indeterminate. Elevated left ventricular end-diastolic  pressure.   2. Right ventricular systolic function is normal. The right ventricular  size is normal. Tricuspid regurgitation signal is inadequate for assessing  PA pressure.   3. There is moderate calcification of the aortic valve leaflet with focal  calcification at the tip of the noncoronary cusp. The aortic valve is  tricuspid. Aortic valve regurgitation is trivial. Aortic valve  sclerosis/calcification is present, without  any evidence of aortic stenosis. Aortic valve area, by VTI measures 3.30  cm. Aortic valve mean gradient measures 3.0 mmHg. Aortic valve Vmax  measures 1.17 m/s.   4. A small pericardial effusion is present. The pericardial effusion is  posterior to the left ventricle.   5. The mitral valve is normal in structure. Trivial mitral valve  regurgitation. No evidence of mitral stenosis.   6. The inferior vena cava is normal in size with greater than 50%  respiratory variability, suggesting right atrial pressure of 3 mmHg.    Lexiscan on December 14, 2019: There was no ST segment deviation noted during stress. Nuclear stress EF: 47%. The left ventricular ejection fraction is mildly decreased (45-54%). The study is normal. This is an intermediate risk study.   Intermediate risk study due to decreased systolic function (EF 73%).  Normal perfusion.  Labs/Other Tests and Data Reviewed:    EKG:  No ECG reviewed.  Recent Labs: 06/28/2021: ALT 18 11/28/2021: Brain Natriuretic  Peptide 4,030; Hemoglobin 10.6; Platelets 126; TSH 5.66 01/02/2022: BUN 59; Creatinine, Ser 8.82; Potassium 4.0; Pro B Natriuretic peptide (BNP) >4923.0; Sodium 134   Recent Lipid Panel Lab Results  Component Value Date/Time   CHOL 132 11/28/2021 02:41 PM   TRIG 44 11/28/2021 02:41 PM   HDL 61 11/28/2021 02:41 PM   CHOLHDL 2.2 11/28/2021 02:41 PM   LDLCALC 58 11/28/2021 02:41 PM   LDLDIRECT 39.0 11/01/2018 11:56 AM    Wt Readings from Last 3 Encounters:  01/07/22 170 lb (77.1 kg)  01/02/22 176 lb 9.6 oz (80.1 kg)  11/28/21 169 lb 6.4 oz (76.8 kg)         Objective:    Vital Signs:  BP (!) 201/125   Pulse (!) 102   Ht 5' 11.5" (1.816 m)   BMI 23.38 kg/m    VITAL SIGNS:  reviewed RESPIRATORY:  normal respiratory effort, symmetric expansion CARDIOVASCULAR:  no peripheral edema  ASSESSMENT & PLAN:    SOB / HFrEF / Pericardial effusion - Newly reduced LVEF 35-40% by echo 01/21/22. Plan for ischemic evaluation. Discussed myoview vs CTA vs cardiac cath. He is agreeable to proceed with cardiac CTA. Will schedule on non HD day. Future considerations for GDMT include Hydralazine, Imdur.   ESRD on HD - Follows with nephrology Dr. Moshe Cipro.   Lupus - Follows with rheumatology.  HTN - BP not at goal - increase Carvedilol to 12.5mg  BID. Discussed to monitor BP at home at least 2 hours after medications and sitting for 5-10 minutes. COntinue Lasix, Irbesartan. If additional agent needed, add Hydralazine. Plan check in via [place] in one week.     Time:   Today, I have spent 11 minutes with the patient with telehealth technology discussing the above problems.     Medication Adjustments/Labs and Tests Ordered: Current medicines are reviewed at length with the patient today.  Concerns regarding medicines are outlined above.   Tests Ordered: No orders of the defined types were placed in this encounter.   Medication Changes: No orders of the defined types were placed in this  encounter.   Follow Up:  In Person in 6 week(s)  Signed, Loel Dubonnet, NP  02/25/2022 2:57 PM    [place]

## 2022-02-25 ENCOUNTER — Telehealth (INDEPENDENT_AMBULATORY_CARE_PROVIDER_SITE_OTHER): Payer: BC Managed Care – PPO | Admitting: Family

## 2022-02-25 ENCOUNTER — Other Ambulatory Visit (HOSPITAL_COMMUNITY): Payer: Self-pay

## 2022-02-25 ENCOUNTER — Encounter (HOSPITAL_BASED_OUTPATIENT_CLINIC_OR_DEPARTMENT_OTHER): Payer: Self-pay | Admitting: Family

## 2022-02-25 VITALS — BP 201/125 | HR 102 | Ht 71.5 in

## 2022-02-25 DIAGNOSIS — I5022 Chronic systolic (congestive) heart failure: Secondary | ICD-10-CM | POA: Diagnosis not present

## 2022-02-25 DIAGNOSIS — I110 Hypertensive heart disease with heart failure: Secondary | ICD-10-CM

## 2022-02-25 DIAGNOSIS — R072 Precordial pain: Secondary | ICD-10-CM

## 2022-02-25 DIAGNOSIS — R0602 Shortness of breath: Secondary | ICD-10-CM

## 2022-02-25 DIAGNOSIS — Z992 Dependence on renal dialysis: Secondary | ICD-10-CM

## 2022-02-25 DIAGNOSIS — I10 Essential (primary) hypertension: Secondary | ICD-10-CM

## 2022-02-25 DIAGNOSIS — N186 End stage renal disease: Secondary | ICD-10-CM

## 2022-02-25 MED ORDER — METOPROLOL TARTRATE 100 MG PO TABS: 100.0000 mg | ORAL_TABLET | Freq: Once | ORAL | 0 refills | Status: DC

## 2022-02-25 MED ORDER — CARVEDILOL 12.5 MG PO TABS: 12.5000 mg | ORAL_TABLET | Freq: Two times a day (BID) | ORAL | 2 refills | Status: DC

## 2022-02-25 MED ORDER — IVABRADINE HCL 5 MG PO TABS
10.0000 mg | ORAL_TABLET | Freq: Once | ORAL | 0 refills | Status: AC
  Filled 2022-02-25: qty 2, 1d supply, fill #0

## 2022-02-25 NOTE — Patient Instructions (Addendum)
Medication Instructions:  Your physician has recommended you make the following change in your medication:   INCREASE Carvedilol to 12.5mg  twice daily   *If you need a refill on your cardiac medications before your next appointment, please call your pharmacy*  Testing/Procedures:   Your cardiac CT will be scheduled at one of the below locations:   [facility] [address] [place], [place] [address] [phone]  If scheduled at [facility], please arrive at the [facility] Entrance (Entrance C2) of [facility] 30 minutes prior to test start time. You can use the FREE valet parking offered at entrance C (encouraged to control the heart rate for the test)  Proceed to the [facility] Radiology Department (first floor) to check-in and test prep.  All radiology patients and guests should use entrance C2 at [facility], accessed from [facility], even though the hospital's physical address listed is [address].     Please follow these instructions carefully (unless otherwise directed):  Hold all erectile dysfunction medications at least 3 days (72 hrs) prior to test. (Ie viagra, cialis, sildenafil, tadalafil, etc) We will administer nitroglycerin during this exam.   On the Night Before the Test: Be sure to Drink plenty of water. Do not consume any caffeinated/decaffeinated beverages or chocolate 12 hours prior to your test. Do not take any antihistamines 12 hours prior to your test.  On the Day of the Test: Drink plenty of water until 1 hour prior to the test. Do not eat any food 1 hour prior to test. You may take your regular medications prior to the test.  Take metoprolol (Lopressor) 100mg  and Ivabradine 10mg  two hours prior to test.  The Metoprolol Tartrate will be sent to your normal pharmacy and the Ivabradine will be mailed to your home.  HOLD Furosemide/Hydrochlorothiazide morning of the test.  After  the Test: Drink plenty of water. After receiving IV contrast, you may experience a mild flushed feeling. This is normal. On occasion, you may experience a mild rash up to 24 hours after the test. This is not dangerous. If this occurs, you can take Benadryl 25 mg and increase your fluid intake. If you experience trouble breathing, this can be serious. If it is severe call 911 IMMEDIATELY. If it is mild, please call our office. If you take any of these medications: Glipizide/Metformin, Avandament, Glucavance, please do not take 48 hours after completing test unless otherwise instructed.  We will call to schedule your test 2-4 weeks out understanding that some insurance companies will need an authorization prior to the service being performed.   For non-scheduling related questions, please contact the cardiac imaging nurse navigator should you have any questions/concerns: Marchia Bond, Cardiac Imaging Nurse Navigator Gordy Clement, Cardiac Imaging Nurse Navigator [facility] Direct Office Dial: [phone]   For scheduling needs, including cancellations and rescheduling, please call [place], [phone].   Follow-Up: At [facility], you and your health needs are our priority.  As part of our continuing mission to provide you with exceptional heart care, we have created designated Provider Care Teams.  These Care Teams include your primary Cardiologist (physician) and Advanced Practice Providers (APPs -  Physician Assistants and Nurse Practitioners) who all work together to provide you with the care you need, when you need it.  We recommend signing up for the patient portal called "MyChart".  Sign up information is provided on this After Visit Summary.  MyChart is used to connect with patients for Virtual Visits (Telemedicine).  Patients are able to view lab/test results, encounter notes, upcoming appointments, etc.  Non-urgent messages can be sent to your  provider as well.   To learn more about what you can do with MyChart, go to [email or web address].    Your next appointment:   December 18th at 3:35pm on Mychart with Laurann Montana, NP   Other Instructions  Please check blood pressure and heart rate once per day and keep a log.   Heart Healthy Diet Recommendations: A low-salt diet is recommended. Meats should be grilled, baked, or boiled. Avoid fried foods. Focus on lean protein sources like fish or chicken with vegetables and fruits. The American Heart Association is a Microbiologist!  American Heart Association Diet and Lifeystyle Recommendations   Exercise recommendations: The American Heart Association recommends 150 minutes of moderate intensity exercise weekly. Try 30 minutes of moderate intensity exercise 4-5 times per week. This could include walking, jogging, or swimming.

## 2022-02-26 ENCOUNTER — Encounter (HOSPITAL_COMMUNITY): Payer: Self-pay

## 2022-02-26 ENCOUNTER — Other Ambulatory Visit (HOSPITAL_COMMUNITY): Payer: Self-pay

## 2022-02-26 DIAGNOSIS — N2581 Secondary hyperparathyroidism of renal origin: Secondary | ICD-10-CM | POA: Diagnosis not present

## 2022-02-26 DIAGNOSIS — Z992 Dependence on renal dialysis: Secondary | ICD-10-CM | POA: Diagnosis not present

## 2022-02-26 DIAGNOSIS — N186 End stage renal disease: Secondary | ICD-10-CM | POA: Diagnosis not present

## 2022-02-28 DIAGNOSIS — N2581 Secondary hyperparathyroidism of renal origin: Secondary | ICD-10-CM | POA: Diagnosis not present

## 2022-02-28 DIAGNOSIS — N186 End stage renal disease: Secondary | ICD-10-CM | POA: Diagnosis not present

## 2022-02-28 DIAGNOSIS — Z992 Dependence on renal dialysis: Secondary | ICD-10-CM | POA: Diagnosis not present

## 2022-03-02 ENCOUNTER — Encounter (HOSPITAL_BASED_OUTPATIENT_CLINIC_OR_DEPARTMENT_OTHER): Payer: Self-pay | Admitting: Family

## 2022-03-02 ENCOUNTER — Encounter (HOSPITAL_BASED_OUTPATIENT_CLINIC_OR_DEPARTMENT_OTHER): Payer: Self-pay

## 2022-03-03 DIAGNOSIS — N2581 Secondary hyperparathyroidism of renal origin: Secondary | ICD-10-CM | POA: Diagnosis not present

## 2022-03-03 DIAGNOSIS — Z992 Dependence on renal dialysis: Secondary | ICD-10-CM | POA: Diagnosis not present

## 2022-03-03 DIAGNOSIS — N186 End stage renal disease: Secondary | ICD-10-CM | POA: Diagnosis not present

## 2022-03-04 ENCOUNTER — Ambulatory Visit: Payer: BC Managed Care – PPO | Admitting: Family Medicine

## 2022-03-04 DIAGNOSIS — M1990 Unspecified osteoarthritis, unspecified site: Secondary | ICD-10-CM | POA: Diagnosis not present

## 2022-03-04 DIAGNOSIS — D72819 Decreased white blood cell count, unspecified: Secondary | ICD-10-CM | POA: Diagnosis not present

## 2022-03-04 DIAGNOSIS — M329 Systemic lupus erythematosus, unspecified: Secondary | ICD-10-CM | POA: Diagnosis not present

## 2022-03-04 DIAGNOSIS — Z8679 Personal history of other diseases of the circulatory system: Secondary | ICD-10-CM | POA: Diagnosis not present

## 2022-03-05 DIAGNOSIS — N186 End stage renal disease: Secondary | ICD-10-CM | POA: Diagnosis not present

## 2022-03-05 DIAGNOSIS — N2581 Secondary hyperparathyroidism of renal origin: Secondary | ICD-10-CM | POA: Diagnosis not present

## 2022-03-05 DIAGNOSIS — Z992 Dependence on renal dialysis: Secondary | ICD-10-CM | POA: Diagnosis not present

## 2022-03-06 ENCOUNTER — Other Ambulatory Visit (HOSPITAL_COMMUNITY): Payer: Self-pay

## 2022-03-06 ENCOUNTER — Encounter (HOSPITAL_COMMUNITY): Payer: Self-pay | Admitting: Nephrology

## 2022-03-06 DIAGNOSIS — R188 Other ascites: Secondary | ICD-10-CM

## 2022-03-07 DIAGNOSIS — N186 End stage renal disease: Secondary | ICD-10-CM | POA: Diagnosis not present

## 2022-03-07 DIAGNOSIS — Z992 Dependence on renal dialysis: Secondary | ICD-10-CM | POA: Diagnosis not present

## 2022-03-07 DIAGNOSIS — N2581 Secondary hyperparathyroidism of renal origin: Secondary | ICD-10-CM | POA: Diagnosis not present

## 2022-03-09 ENCOUNTER — Ambulatory Visit (HOSPITAL_COMMUNITY)
Admission: RE | Admit: 2022-03-09 | Discharge: 2022-03-09 | Disposition: A | Discharge status: Home / Self Care | Payer: BC Managed Care – PPO | Source: Ambulatory Visit | Attending: Nephrology | Admitting: Nephrology

## 2022-03-09 DIAGNOSIS — R188 Other ascites: Secondary | ICD-10-CM | POA: Diagnosis not present

## 2022-03-09 HISTORY — PX: IR PARACENTESIS: IMG2679

## 2022-03-09 MED ORDER — LIDOCAINE HCL 1 % IJ SOLN
INTRAMUSCULAR | Status: AC
  Administered 2022-03-09: 10 mL
  Filled 2022-03-09: qty 20

## 2022-03-09 NOTE — Procedures (Signed)
PROCEDURE SUMMARY:  Successful image-guided paracentesis from the right lower abdomen.  Yielded 800 milliliters of clear yellow fluid.  No immediate complications.  EBL < 1 mL Patient tolerated well.   Specimen was not sent for labs.  Please see imaging section of Epic for full dictation.  Joaquim Nam PA-C 03/09/2022 10:58 AM

## 2022-03-10 DIAGNOSIS — N186 End stage renal disease: Secondary | ICD-10-CM | POA: Diagnosis not present

## 2022-03-10 DIAGNOSIS — N2581 Secondary hyperparathyroidism of renal origin: Secondary | ICD-10-CM | POA: Diagnosis not present

## 2022-03-10 DIAGNOSIS — Z992 Dependence on renal dialysis: Secondary | ICD-10-CM | POA: Diagnosis not present

## 2022-03-12 ENCOUNTER — Telehealth: Payer: Self-pay | Admitting: Cardiology

## 2022-03-12 ENCOUNTER — Telehealth (HOSPITAL_COMMUNITY): Payer: Self-pay | Admitting: *Deleted

## 2022-03-12 DIAGNOSIS — N2581 Secondary hyperparathyroidism of renal origin: Secondary | ICD-10-CM | POA: Diagnosis not present

## 2022-03-12 DIAGNOSIS — N186 End stage renal disease: Secondary | ICD-10-CM | POA: Diagnosis not present

## 2022-03-12 DIAGNOSIS — Z992 Dependence on renal dialysis: Secondary | ICD-10-CM | POA: Diagnosis not present

## 2022-03-12 NOTE — Telephone Encounter (Signed)
Attempted to call patient regarding upcoming cardiac CT appointment. °Left message on voicemail with name and callback number ° °Courtny Bennison RN Navigator Cardiac Imaging °[facility] °[phone] Office °[phone] Cell ° °

## 2022-03-12 NOTE — Telephone Encounter (Signed)
Patient is requesting a call back to discuss 11/17 CT. Specifically, he would like to know if there are any special instructions. Will he need to take medication prior? Please advise.

## 2022-03-12 NOTE — Telephone Encounter (Signed)
Left VM to call back 

## 2022-03-13 ENCOUNTER — Other Ambulatory Visit (HOSPITAL_COMMUNITY): Payer: Self-pay

## 2022-03-13 ENCOUNTER — Ambulatory Visit (HOSPITAL_COMMUNITY)
Admission: RE | Admit: 2022-03-13 | Discharge: 2022-03-13 | Disposition: A | Discharge status: Home / Self Care | Payer: BC Managed Care – PPO | Source: Ambulatory Visit | Attending: Family | Admitting: Family

## 2022-03-13 DIAGNOSIS — R072 Precordial pain: Secondary | ICD-10-CM | POA: Diagnosis not present

## 2022-03-13 LAB — POCT I-STAT CREATININE: Creatinine, Ser: 6.1 mg/dL — ABNORMAL HIGH (ref 0.61–1.24)

## 2022-03-13 MED ORDER — NITROGLYCERIN 0.4 MG SL SUBL
0.8000 mg | SUBLINGUAL_TABLET | Freq: Once | SUBLINGUAL | Status: AC
  Administered 2022-03-13: 0.8 mg via SUBLINGUAL

## 2022-03-13 MED ORDER — NITROGLYCERIN 0.4 MG SL SUBL
SUBLINGUAL_TABLET | SUBLINGUAL | Status: AC
  Filled 2022-03-13: qty 2

## 2022-03-13 MED ORDER — IOHEXOL 350 MG/ML SOLN
100.0000 mL | Freq: Once | INTRAVENOUS | Status: AC | PRN
  Administered 2022-03-13: 100 mL via INTRAVENOUS

## 2022-03-13 NOTE — Telephone Encounter (Signed)
Spoke with pt and advised that the only medication he needed for his CT was the lopressor 2 hours prior to the CT. Pt thought he was supposed to get something in the mail related to the contrast. Pt verbalized understanding and had no additional questions.

## 2022-03-14 DIAGNOSIS — Z992 Dependence on renal dialysis: Secondary | ICD-10-CM | POA: Diagnosis not present

## 2022-03-14 DIAGNOSIS — N186 End stage renal disease: Secondary | ICD-10-CM | POA: Diagnosis not present

## 2022-03-14 DIAGNOSIS — N2581 Secondary hyperparathyroidism of renal origin: Secondary | ICD-10-CM | POA: Diagnosis not present

## 2022-03-16 DIAGNOSIS — N2581 Secondary hyperparathyroidism of renal origin: Secondary | ICD-10-CM | POA: Diagnosis not present

## 2022-03-16 DIAGNOSIS — Z992 Dependence on renal dialysis: Secondary | ICD-10-CM | POA: Diagnosis not present

## 2022-03-16 DIAGNOSIS — N186 End stage renal disease: Secondary | ICD-10-CM | POA: Diagnosis not present

## 2022-03-18 DIAGNOSIS — N186 End stage renal disease: Secondary | ICD-10-CM | POA: Diagnosis not present

## 2022-03-18 DIAGNOSIS — Z992 Dependence on renal dialysis: Secondary | ICD-10-CM | POA: Diagnosis not present

## 2022-03-18 DIAGNOSIS — N2581 Secondary hyperparathyroidism of renal origin: Secondary | ICD-10-CM | POA: Diagnosis not present

## 2022-03-21 DIAGNOSIS — N2581 Secondary hyperparathyroidism of renal origin: Secondary | ICD-10-CM | POA: Diagnosis not present

## 2022-03-21 DIAGNOSIS — Z992 Dependence on renal dialysis: Secondary | ICD-10-CM | POA: Diagnosis not present

## 2022-03-21 DIAGNOSIS — N186 End stage renal disease: Secondary | ICD-10-CM | POA: Diagnosis not present

## 2022-03-24 DIAGNOSIS — N186 End stage renal disease: Secondary | ICD-10-CM | POA: Diagnosis not present

## 2022-03-24 DIAGNOSIS — N2581 Secondary hyperparathyroidism of renal origin: Secondary | ICD-10-CM | POA: Diagnosis not present

## 2022-03-24 DIAGNOSIS — Z992 Dependence on renal dialysis: Secondary | ICD-10-CM | POA: Diagnosis not present

## 2022-03-26 DIAGNOSIS — N2581 Secondary hyperparathyroidism of renal origin: Secondary | ICD-10-CM | POA: Diagnosis not present

## 2022-03-26 DIAGNOSIS — M3214 Glomerular disease in systemic lupus erythematosus: Secondary | ICD-10-CM | POA: Diagnosis not present

## 2022-03-26 DIAGNOSIS — Z992 Dependence on renal dialysis: Secondary | ICD-10-CM | POA: Diagnosis not present

## 2022-03-26 DIAGNOSIS — N186 End stage renal disease: Secondary | ICD-10-CM | POA: Diagnosis not present

## 2022-03-28 DIAGNOSIS — N186 End stage renal disease: Secondary | ICD-10-CM | POA: Diagnosis not present

## 2022-03-28 DIAGNOSIS — N2581 Secondary hyperparathyroidism of renal origin: Secondary | ICD-10-CM | POA: Diagnosis not present

## 2022-03-28 DIAGNOSIS — Z992 Dependence on renal dialysis: Secondary | ICD-10-CM | POA: Diagnosis not present

## 2022-03-31 DIAGNOSIS — N186 End stage renal disease: Secondary | ICD-10-CM | POA: Diagnosis not present

## 2022-03-31 DIAGNOSIS — Z992 Dependence on renal dialysis: Secondary | ICD-10-CM | POA: Diagnosis not present

## 2022-03-31 DIAGNOSIS — N2581 Secondary hyperparathyroidism of renal origin: Secondary | ICD-10-CM | POA: Diagnosis not present

## 2022-04-02 DIAGNOSIS — Z992 Dependence on renal dialysis: Secondary | ICD-10-CM | POA: Diagnosis not present

## 2022-04-02 DIAGNOSIS — N186 End stage renal disease: Secondary | ICD-10-CM | POA: Diagnosis not present

## 2022-04-02 DIAGNOSIS — N2581 Secondary hyperparathyroidism of renal origin: Secondary | ICD-10-CM | POA: Diagnosis not present

## 2022-04-04 DIAGNOSIS — Z992 Dependence on renal dialysis: Secondary | ICD-10-CM | POA: Diagnosis not present

## 2022-04-04 DIAGNOSIS — N186 End stage renal disease: Secondary | ICD-10-CM | POA: Diagnosis not present

## 2022-04-04 DIAGNOSIS — N2581 Secondary hyperparathyroidism of renal origin: Secondary | ICD-10-CM | POA: Diagnosis not present

## 2022-04-07 DIAGNOSIS — N186 End stage renal disease: Secondary | ICD-10-CM | POA: Diagnosis not present

## 2022-04-07 DIAGNOSIS — N2581 Secondary hyperparathyroidism of renal origin: Secondary | ICD-10-CM | POA: Diagnosis not present

## 2022-04-07 DIAGNOSIS — Z992 Dependence on renal dialysis: Secondary | ICD-10-CM | POA: Diagnosis not present

## 2022-04-09 DIAGNOSIS — N186 End stage renal disease: Secondary | ICD-10-CM | POA: Diagnosis not present

## 2022-04-09 DIAGNOSIS — N2581 Secondary hyperparathyroidism of renal origin: Secondary | ICD-10-CM | POA: Diagnosis not present

## 2022-04-09 DIAGNOSIS — Z992 Dependence on renal dialysis: Secondary | ICD-10-CM | POA: Diagnosis not present

## 2022-04-11 DIAGNOSIS — N2581 Secondary hyperparathyroidism of renal origin: Secondary | ICD-10-CM | POA: Diagnosis not present

## 2022-04-11 DIAGNOSIS — Z992 Dependence on renal dialysis: Secondary | ICD-10-CM | POA: Diagnosis not present

## 2022-04-11 DIAGNOSIS — N186 End stage renal disease: Secondary | ICD-10-CM | POA: Diagnosis not present

## 2022-04-13 ENCOUNTER — Encounter (HOSPITAL_BASED_OUTPATIENT_CLINIC_OR_DEPARTMENT_OTHER): Payer: Self-pay | Admitting: Family

## 2022-04-13 ENCOUNTER — Telehealth (INDEPENDENT_AMBULATORY_CARE_PROVIDER_SITE_OTHER): Payer: BC Managed Care – PPO | Admitting: Family

## 2022-04-13 VITALS — BP 175/110 | HR 88 | Ht 71.5 in

## 2022-04-13 DIAGNOSIS — Z992 Dependence on renal dialysis: Secondary | ICD-10-CM | POA: Diagnosis not present

## 2022-04-13 DIAGNOSIS — N186 End stage renal disease: Secondary | ICD-10-CM | POA: Diagnosis not present

## 2022-04-13 DIAGNOSIS — I25118 Atherosclerotic heart disease of native coronary artery with other forms of angina pectoris: Secondary | ICD-10-CM

## 2022-04-13 DIAGNOSIS — I10 Essential (primary) hypertension: Secondary | ICD-10-CM | POA: Diagnosis not present

## 2022-04-13 DIAGNOSIS — E785 Hyperlipidemia, unspecified: Secondary | ICD-10-CM

## 2022-04-13 MED ORDER — ASPIRIN 81 MG PO TBEC: 81.0000 mg | DELAYED_RELEASE_TABLET | Freq: Every day | ORAL | 3 refills | Status: DC

## 2022-04-13 MED ORDER — CARVEDILOL 25 MG PO TABS: 25.0000 mg | ORAL_TABLET | Freq: Two times a day (BID) | ORAL | 3 refills | Status: DC

## 2022-04-13 MED ORDER — ROSUVASTATIN CALCIUM 5 MG PO TABS: 5.0000 mg | ORAL_TABLET | ORAL | 3 refills | Status: DC

## 2022-04-13 NOTE — Progress Notes (Signed)
Virtual Visit via Video Note   Because of [name]'s co-morbid illnesses, he is at least at moderate risk for complications without adequate follow up.  This format is felt to be most appropriate for this patient at this time.  All issues noted in this document were discussed and addressed.  A limited physical exam was performed with this format.  Please refer to the patient's chart for his consent to telehealth for [facility].  Date:  04/13/2022   ID:  [name], DOB Jun 18, 1970, MRN [id number] The patient was identified using 2 identifiers.  Patient Location: Home Provider Location: Office/Clinic   PCP:  Mliss Sax, MD   [facility] Providers Cardiologist:  Garwin Brothers, MD     Evaluation Performed:  Follow-Up Visit  Chief Complaint:  follow up after cardiac CT  History of Present Illness:    [name] is a 51 y.o. male with hx of coronary calcification on CT, aortic atherosclerosis, HFpEF, SLE, cardiac murmur, hypothyroidism, HTN, etoh use, lupus nephritis with ESRD (HD T/Th/Sa), ascites with prior to paracentesis with no liver abnormality noted, diverticulosis without diverticulitis.   Initial evaluation 2021 for congestive heart failure with DOE and orthopnea which improved after diuresis. Echo 2021 LVEF 60-65%, no RWMA, moderately dilated LA/RA, trivial MR/AI, ascending aorta up to 40mm. Lexiscan no ischemia. Repeat echo 2022 LVEF 55-60%, moderate LVH, trivial MR, trivial AI, focal calcific at top of aortic valve. Echo 2022 LVEF 55-60%, severe LVH, elevated LVEDP, moderate calcification of oartic valve leaflet with focal calcification at tip of noncoronary cusp, trivial AI/MR, small pericardial effusion.   He had COVID 06/2021 with CT chest with no PE though did show small bilateral pleural effusion, atelectasis at lung bases, moderate ascites, splenomegaly, aortic and coronary artery calcification.  Evaluated by PCP 01/02/22  with dyspnea and orthopnea with 7 lbs weight gain and BNP >4,923. Seen 01/07/22 by Sharlene Dory, NP for evaluation of heart failure. Symptoms improved with Lasix prescribed by PCP and HD. Irbesartan increased and Carvedilol increased as BP not at goal. He was referred to pulmonology and echo updated. Echo 01/21/22 with newly reduced LVEF 35-40%, LV global hypokinesis, severe LVH, RVSF mildly reduced 54.60mmHg, moderately elevated PASP, LA moderately dilated, RA mildly dilated, mild MR, mild to moderate TR, mild AI, mild dilation aortic root 43mm.   At follow up 02/25/22 Carvedilol was increased for BP. CTA ordered for ischemic evaluation. Cardiac CTA 03/13/22 with coronary calcium score or 128 with mild nonobstructive coronary disease.   He presents today for follow up via video visit. Shortness of breath has improved. Notes chest pain is only noticeable Monday evening when he is two days wihtout HD. BP  175-185/110-115. Has not needed Lasix recently. His nephrologist recently increased Carvedilol but he has not picked up increased dose. We reviewed CT results. Denies chest pain, edema, orthopnea, PND, palpitations.   Past Medical History:  Diagnosis Date   Chronic kidney disease    Lupus (HCC)    Thyroid disease    Past Surgical History:  Procedure Laterality Date   AV FISTULA PLACEMENT Left 08/26/2020   Procedure: LEFT RADIOCEPHALIC ARTERIOVENOUS (AV) FISTULA CREATION;  Surgeon: Chuck Hint, MD;  Location: [facility] OR;  Service: Vascular;  Laterality: Left;   IR FLUORO GUIDE CV LINE RIGHT  04/30/2020   IR PARACENTESIS  05/07/2020   IR PARACENTESIS  05/10/2020   IR PARACENTESIS  06/03/2020   IR PARACENTESIS  06/24/2020   IR  PARACENTESIS  07/15/2020   IR PARACENTESIS  07/29/2020   IR PARACENTESIS  08/14/2020   IR PARACENTESIS  06/11/2021   IR PARACENTESIS  08/08/2021   IR PARACENTESIS  09/12/2021   IR PARACENTESIS  10/10/2021   IR PARACENTESIS  03/09/2022   IR PERC TUN PERIT CATH WO PORT S&I /IMAG   05/03/2020   IR US GUIDE VASC ACCESS RIGHT  04/30/2020   IR US GUIDE VASC ACCESS RIGHT  05/03/2020     Current Meds  Medication Sig   carvedilol (COREG) 12.5 MG tablet Take 1 tablet (12.5 mg total) by mouth 2 (two) times daily.   hydroxychloroquine (PLAQUENIL) 200 MG tablet Take 200 mg by mouth 3 (three) times a week. No set days   irbesartan (AVAPRO) 300 MG tablet Take 1 tablet (300 mg total) by mouth daily.   levothyroxine (SYNTHROID) 50 MCG tablet Take 1 tablet (50 mcg total) by mouth daily before breakfast.     Allergies:   Patient has no known allergies.   Social History   Tobacco Use   Smoking status: Never   Smokeless tobacco: Never  Vaping Use   Vaping Use: Never used  Substance Use Topics   Alcohol use: Yes    Comment: 2-4 glasses of wine 2-3 times weekly   Drug use: No     Family Hx: The patient's family history includes Cancer in his mother.  ROS:   Please see the history of present illness.     All other systems reviewed and are negative.   Prior CV studies:   The following studies were reviewed today:  Cardiac CTA 03/13/22 FINDINGS: Image quality: Excellent.   Noise artifact is: Limited.   Coronary Arteries:  Normal coronary origin.  Right dominance.   Left main: The left main is a large caliber vessel with a normal take off from the left coronary cusp that trifurcates into a LAD, LCX, and ramus intermedius. There is no plaque or stenosis.   Left anterior descending artery: The LAD gives off 2 patent diagonal branches. There is mild calcified plaque in the proximal to mid LAD with associated stenosis of 25-49%.   Ramus intermedius: Patent with no evidence of plaque or stenosis.   Left circumflex artery: The LCX is non-dominant and patent with no evidence of plaque or stenosis. The LCX gives off 1 patent obtuse marginal branch.   Right coronary artery: The RCA is dominant with normal take off from the right coronary cusp. The RCA terminates as a  PDA and right posterolateral branch. There is minimal calcified plaque in the proximal RCA with associated stenosis of <25%.   Right Atrium: Right atrial size is within normal limits.   Right Ventricle: The right ventricular cavity is within normal limits.   Left Atrium: Left atrial size is normal in size with no left atrial appendage filling defect.   Left Ventricle: The ventricular cavity size is within normal limits. There are no stigmata of prior infarction. There is no abnormal filling defect.   Pulmonary arteries: Normal in size without proximal filling defect.   Pulmonary veins: Normal pulmonary venous drainage.   Pericardium: Normal thickness with no significant effusion or calcium present.   Cardiac valves: The aortic valve is trileaflet without significant calcification. The mitral valve is normal structure with mild mitral annular calcification.   Aorta: Normal caliber with no significant disease.   Possible small PFO.   Extra-cardiac findings: See attached radiology report for non-cardiac structures.   IMPRESSION: 1. Coronary calcium  score of 128. This was 88th percentile for age-, sex, and race-matched controls.   2.  Normal coronary origin with right dominance.   3.  Mild atherosclerosis.  CAD RADS 2.   4.  Recommend preventive therapy and risk factor modificaiton.   5.  Consider non atherosclerotic causes of chest pain.   Echo 01/21/22  1. Left ventricular ejection fraction, by estimation, is 35 to 40%. The  left ventricle has moderately decreased function. The left ventricle  demonstrates global hypokinesis. There is severe concentric left  ventricular hypertrophy. Left ventricular  diastolic parameters are indeterminate.   2. Right ventricular systolic function is mildly reduced. The right  ventricular size is normal. There is moderately elevated pulmonary artery  systolic pressure. The estimated right ventricular systolic pressure is  54.4 mmHg.    3. Left atrial size was moderately dilated.   4. Right atrial size was mildly dilated.   5. The mitral valve is grossly normal. Mild mitral valve regurgitation.  No evidence of mitral stenosis.   6. Tricuspid valve regurgitation is mild to moderate.   7. The aortic valve is tricuspid. There is mild calcification of the  aortic valve. Aortic valve regurgitation is mild. No aortic stenosis is  present.   8. Aortic dilatation noted. There is mild dilatation of the aortic root,  measuring 43 mm.   9. The inferior vena cava is dilated in size with <50% respiratory  variability, suggesting right atrial pressure of 15 mmHg.    2D echocardiogram on April 25, 2021: 1. Left ventricular ejection fraction, by estimation, is 55 to 60%. The  left ventricle has normal function. The left ventricle has no regional  wall motion abnormalities. There is severe concentric left ventricular  hypertrophy. Left ventricular diastolic   parameters are indeterminate. Elevated left ventricular end-diastolic  pressure.   2. Right ventricular systolic function is normal. The right ventricular  size is normal. Tricuspid regurgitation signal is inadequate for assessing  PA pressure.   3. There is moderate calcification of the aortic valve leaflet with focal  calcification at the tip of the noncoronary cusp. The aortic valve is  tricuspid. Aortic valve regurgitation is trivial. Aortic valve  sclerosis/calcification is present, without  any evidence of aortic stenosis. Aortic valve area, by VTI measures 3.30  cm. Aortic valve mean gradient measures 3.0 mmHg. Aortic valve Vmax  measures 1.17 m/s.   4. A small pericardial effusion is present. The pericardial effusion is  posterior to the left ventricle.   5. The mitral valve is normal in structure. Trivial mitral valve  regurgitation. No evidence of mitral stenosis.   6. The inferior vena cava is normal in size with greater than 50%  respiratory variability,  suggesting right atrial pressure of 3 mmHg.    Lexiscan on December 14, 2019: There was no ST segment deviation noted during stress. Nuclear stress EF: 47%. The left ventricular ejection fraction is mildly decreased (45-54%). The study is normal. This is an intermediate risk study.   Intermediate risk study due to decreased systolic function (EF 47%).  Normal perfusion.  Labs/Other Tests and Data Reviewed:    EKG:  No ECG reviewed.  Recent Labs: 06/28/2021: ALT 18 11/28/2021: Brain Natriuretic Peptide 4,030; Hemoglobin 10.6; Platelets 126; TSH 5.66 01/02/2022: BUN 59; Potassium 4.0; Pro B Natriuretic peptide (BNP) >4923.0; Sodium 134 03/13/2022: Creatinine, Ser 6.10   Recent Lipid Panel Lab Results  Component Value Date/Time   CHOL 132 11/28/2021 02:41 PM   TRIG 44 11/28/2021  02:41 PM   HDL 61 11/28/2021 02:41 PM   CHOLHDL 2.2 11/28/2021 02:41 PM   LDLCALC 58 11/28/2021 02:41 PM   LDLDIRECT 39.0 11/01/2018 11:56 AM    Wt Readings from Last 3 Encounters:  01/07/22 170 lb (77.1 kg)  01/02/22 176 lb 9.6 oz (80.1 kg)  11/28/21 169 lb 6.4 oz (76.8 kg)         Objective:    Vital Signs:  BP (!) 175/110   Pulse 88   Ht 5' 11.5" (1.816 m)   BMI 23.38 kg/m    VITAL SIGNS:  reviewed RESPIRATORY:  normal respiratory effort, symmetric expansion CARDIOVASCULAR:  no peripheral edema  ASSESSMENT & PLAN:    SOB / HFrEF / Pericardial effusion - Newly reduced LVEF 35-40% by echo 01/21/22. Echo 02/2022 nonobstructive CAD. GDMT limited by CKD. Continue Carvedilol. Consider Hydralazine based on BP check in in one week. Volume management per HD.  ESRD on HD - Follows with nephrology Dr. Kathrene Bongo.   Lupus - Follows with rheumatology.  HTN - BP not at goal - nephrology recently recommended increase Carvedilol to 52mg  BID. He has not yet made this change but agrees to change and will use up 12.5mg  tablets by taking two twice per day. Continue Lasix, Irbesartan. If additional agent  needed, add Hydralazine. Plan check in via MyChart in one week.   CAD - Nonobstructive by CTA. No anginal symptoms. Start Aspirin 81mg  QD. LDL is <70 but given plaque stabilization benefit, he is agreeable to start Rosuvastatin 5 mg three itmes per week. Carvedilol, as above. Heart healthy diet and regular cardiovascular exercise encouraged.      Time:   Today, I have spent 11 minutes with the patient with telehealth technology discussing the above problems.     Medication Adjustments/Labs and Tests Ordered: Current medicines are reviewed at length with the patient today.  Concerns regarding medicines are outlined above.   Tests Ordered: No orders of the defined types were placed in this encounter.   Medication Changes: No orders of the defined types were placed in this encounter.   Follow Up:  Virtual Visit  in 6 week(s)  Signed, Alver Sorrow, NP  04/13/2022 3:44 PM    [facility]

## 2022-04-13 NOTE — Patient Instructions (Addendum)
Medication Instructions:   Your physician has recommended you make the following change in your medication:    START Rosuvastatin 5mg  three times per week  START Aspirin EC 81mg  daily (okay to check with nephrology prior to starting)  INCREASE Carvedilol to 25mg  twice daily as recommended by nephrology You may use up your 12.5mg  tablets by taking two together twice per day  *If you need a refill on your cardiac medications before your next appointment, please call your pharmacy*   Lab Work/Testing/Procedures:  Your CTA showed mild nonobstructive coronary disease. It is not enough to need a stent or to cause damage to heart muscle, but enough to focus on prevention.   For coronary artery disease often called "heart disease" we aim for optimal guideline directed medical therapy. We use the "A, B, C"s to help keep [place] on track!  A = Aspirin 81mg  daily B = Beta blocker which helps to relax the heart. This is your Carvedilol. C = Cholesterol control. You take Rosuvastatin to help control your cholesterol and stabilize the plaque.   Follow-Up: At [facility], you and your health needs are our priority.  As part of our continuing mission to provide you with exceptional heart care, we have created designated Provider Care Teams.  These Care Teams include your primary Cardiologist (physician) and Advanced Practice Providers (APPs -  Physician Assistants and Nurse Practitioners) who all work together to provide you with the care you need, when you need it.  We recommend signing up for the patient portal called "MyChart".  Sign up information is provided on this After Visit Summary.  MyChart is used to connect with patients for Virtual Visits (Telemedicine).  Patients are able to view lab/test results, encounter notes, upcoming appointments, etc.  Non-urgent messages can be sent to your provider as well.   To learn more about what you can do with MyChart, go to [email or web address].     Your next appointment:   06/05/22 at 1:30 PM you will have a MyChart video visit with Loel Dubonnet, NP   Other Instructions  If your blood pressure remains difficult to control... we may add a medication called Hydralazine or Isosorbide.  Tips to Measure your Blood Pressure Correctly  Here's what you can do to ensure a correct reading:  Don't drink a caffeinated beverage or smoke during the 30 minutes before the test.  Sit quietly for five minutes before the test begins.  During the measurement, sit in a chair with your feet on the floor and your arm supported so your elbow is at about heart level.  The inflatable part of the cuff should completely cover at least 80% of your upper arm, and the cuff should be placed on bare skin, not over a shirt.  Don't talk during the measurement.   Blood pressure categories  Blood pressure category SYSTOLIC (upper number)  DIASTOLIC (lower number)  Normal Less than 120 mm Hg and Less than 80 mm Hg  Elevated 120-129 mm Hg and Less than 80 mm Hg  High blood pressure: Stage 1 hypertension 130-139 mm Hg or 80-89 mm Hg  High blood pressure: Stage 2 hypertension 140 mm Hg or higher or 90 mm Hg or higher  Hypertensive crisis (consult your doctor immediately) Higher than 180 mm Hg and/or Higher than 120 mm Hg  Source: American Heart Association and American Stroke Association. For more on getting your blood pressure under control, buy Controlling Your Blood Pressure, a Special Health Report from [facility] [employer].   Blood Pressure Log   Date   Time  Blood Pressure  Example: Nov 1 9 AM 124/78

## 2022-04-14 DIAGNOSIS — Z992 Dependence on renal dialysis: Secondary | ICD-10-CM | POA: Diagnosis not present

## 2022-04-14 DIAGNOSIS — N186 End stage renal disease: Secondary | ICD-10-CM | POA: Diagnosis not present

## 2022-04-14 DIAGNOSIS — N2581 Secondary hyperparathyroidism of renal origin: Secondary | ICD-10-CM | POA: Diagnosis not present

## 2022-04-15 ENCOUNTER — Encounter (HOSPITAL_BASED_OUTPATIENT_CLINIC_OR_DEPARTMENT_OTHER): Payer: Self-pay | Admitting: Family

## 2022-04-16 DIAGNOSIS — N2581 Secondary hyperparathyroidism of renal origin: Secondary | ICD-10-CM | POA: Diagnosis not present

## 2022-04-16 DIAGNOSIS — Z992 Dependence on renal dialysis: Secondary | ICD-10-CM | POA: Diagnosis not present

## 2022-04-16 DIAGNOSIS — N186 End stage renal disease: Secondary | ICD-10-CM | POA: Diagnosis not present

## 2022-04-18 DIAGNOSIS — N2581 Secondary hyperparathyroidism of renal origin: Secondary | ICD-10-CM | POA: Diagnosis not present

## 2022-04-18 DIAGNOSIS — N186 End stage renal disease: Secondary | ICD-10-CM | POA: Diagnosis not present

## 2022-04-18 DIAGNOSIS — Z992 Dependence on renal dialysis: Secondary | ICD-10-CM | POA: Diagnosis not present

## 2022-04-21 DIAGNOSIS — N2581 Secondary hyperparathyroidism of renal origin: Secondary | ICD-10-CM | POA: Diagnosis not present

## 2022-04-21 DIAGNOSIS — Z992 Dependence on renal dialysis: Secondary | ICD-10-CM | POA: Diagnosis not present

## 2022-04-21 DIAGNOSIS — N186 End stage renal disease: Secondary | ICD-10-CM | POA: Diagnosis not present

## 2022-04-22 ENCOUNTER — Encounter (HOSPITAL_BASED_OUTPATIENT_CLINIC_OR_DEPARTMENT_OTHER): Payer: Self-pay

## 2022-04-23 DIAGNOSIS — Z992 Dependence on renal dialysis: Secondary | ICD-10-CM | POA: Diagnosis not present

## 2022-04-23 DIAGNOSIS — N186 End stage renal disease: Secondary | ICD-10-CM | POA: Diagnosis not present

## 2022-04-23 DIAGNOSIS — N2581 Secondary hyperparathyroidism of renal origin: Secondary | ICD-10-CM | POA: Diagnosis not present

## 2022-04-25 DIAGNOSIS — N2581 Secondary hyperparathyroidism of renal origin: Secondary | ICD-10-CM | POA: Diagnosis not present

## 2022-04-25 DIAGNOSIS — Z992 Dependence on renal dialysis: Secondary | ICD-10-CM | POA: Diagnosis not present

## 2022-04-25 DIAGNOSIS — N186 End stage renal disease: Secondary | ICD-10-CM | POA: Diagnosis not present

## 2022-04-26 DIAGNOSIS — M3214 Glomerular disease in systemic lupus erythematosus: Secondary | ICD-10-CM | POA: Diagnosis not present

## 2022-04-26 DIAGNOSIS — N186 End stage renal disease: Secondary | ICD-10-CM | POA: Diagnosis not present

## 2022-04-26 DIAGNOSIS — Z992 Dependence on renal dialysis: Secondary | ICD-10-CM | POA: Diagnosis not present

## 2022-04-28 DIAGNOSIS — Z992 Dependence on renal dialysis: Secondary | ICD-10-CM | POA: Diagnosis not present

## 2022-04-28 DIAGNOSIS — N2581 Secondary hyperparathyroidism of renal origin: Secondary | ICD-10-CM | POA: Diagnosis not present

## 2022-04-28 DIAGNOSIS — N186 End stage renal disease: Secondary | ICD-10-CM | POA: Diagnosis not present

## 2022-05-02 DIAGNOSIS — N186 End stage renal disease: Secondary | ICD-10-CM | POA: Diagnosis not present

## 2022-05-02 DIAGNOSIS — Z992 Dependence on renal dialysis: Secondary | ICD-10-CM | POA: Diagnosis not present

## 2022-05-02 DIAGNOSIS — N2581 Secondary hyperparathyroidism of renal origin: Secondary | ICD-10-CM | POA: Diagnosis not present

## 2022-05-05 DIAGNOSIS — N2581 Secondary hyperparathyroidism of renal origin: Secondary | ICD-10-CM | POA: Diagnosis not present

## 2022-05-05 DIAGNOSIS — N186 End stage renal disease: Secondary | ICD-10-CM | POA: Diagnosis not present

## 2022-05-05 DIAGNOSIS — Z992 Dependence on renal dialysis: Secondary | ICD-10-CM | POA: Diagnosis not present

## 2022-05-07 DIAGNOSIS — N186 End stage renal disease: Secondary | ICD-10-CM | POA: Diagnosis not present

## 2022-05-07 DIAGNOSIS — N2581 Secondary hyperparathyroidism of renal origin: Secondary | ICD-10-CM | POA: Diagnosis not present

## 2022-05-07 DIAGNOSIS — Z992 Dependence on renal dialysis: Secondary | ICD-10-CM | POA: Diagnosis not present

## 2022-05-07 NOTE — Telephone Encounter (Signed)
Called patient to check in on blood pressure. Reports ranging 135-155 but more recently has been down trending. He did not have specific readings with him at time of call. He was agreeable to send [place] readings in another week or so via MyChart and follow up as scheduled 06/05/22.   Loel Dubonnet, NP  05/07/2022 12:41 PM

## 2022-05-09 DIAGNOSIS — N186 End stage renal disease: Secondary | ICD-10-CM | POA: Diagnosis not present

## 2022-05-09 DIAGNOSIS — Z992 Dependence on renal dialysis: Secondary | ICD-10-CM | POA: Diagnosis not present

## 2022-05-09 DIAGNOSIS — N2581 Secondary hyperparathyroidism of renal origin: Secondary | ICD-10-CM | POA: Diagnosis not present

## 2022-05-12 DIAGNOSIS — N2581 Secondary hyperparathyroidism of renal origin: Secondary | ICD-10-CM | POA: Diagnosis not present

## 2022-05-12 DIAGNOSIS — N186 End stage renal disease: Secondary | ICD-10-CM | POA: Diagnosis not present

## 2022-05-12 DIAGNOSIS — Z992 Dependence on renal dialysis: Secondary | ICD-10-CM | POA: Diagnosis not present

## 2022-05-14 DIAGNOSIS — N2581 Secondary hyperparathyroidism of renal origin: Secondary | ICD-10-CM | POA: Diagnosis not present

## 2022-05-14 DIAGNOSIS — N186 End stage renal disease: Secondary | ICD-10-CM | POA: Diagnosis not present

## 2022-05-14 DIAGNOSIS — Z992 Dependence on renal dialysis: Secondary | ICD-10-CM | POA: Diagnosis not present

## 2022-05-16 DIAGNOSIS — N2581 Secondary hyperparathyroidism of renal origin: Secondary | ICD-10-CM | POA: Diagnosis not present

## 2022-05-16 DIAGNOSIS — Z992 Dependence on renal dialysis: Secondary | ICD-10-CM | POA: Diagnosis not present

## 2022-05-16 DIAGNOSIS — N186 End stage renal disease: Secondary | ICD-10-CM | POA: Diagnosis not present

## 2022-05-19 DIAGNOSIS — N2581 Secondary hyperparathyroidism of renal origin: Secondary | ICD-10-CM | POA: Diagnosis not present

## 2022-05-19 DIAGNOSIS — N186 End stage renal disease: Secondary | ICD-10-CM | POA: Diagnosis not present

## 2022-05-19 DIAGNOSIS — Z992 Dependence on renal dialysis: Secondary | ICD-10-CM | POA: Diagnosis not present

## 2022-05-21 DIAGNOSIS — Z992 Dependence on renal dialysis: Secondary | ICD-10-CM | POA: Diagnosis not present

## 2022-05-21 DIAGNOSIS — N2581 Secondary hyperparathyroidism of renal origin: Secondary | ICD-10-CM | POA: Diagnosis not present

## 2022-05-21 DIAGNOSIS — N186 End stage renal disease: Secondary | ICD-10-CM | POA: Diagnosis not present

## 2022-05-23 DIAGNOSIS — N186 End stage renal disease: Secondary | ICD-10-CM | POA: Diagnosis not present

## 2022-05-23 DIAGNOSIS — Z992 Dependence on renal dialysis: Secondary | ICD-10-CM | POA: Diagnosis not present

## 2022-05-23 DIAGNOSIS — N2581 Secondary hyperparathyroidism of renal origin: Secondary | ICD-10-CM | POA: Diagnosis not present

## 2022-05-26 DIAGNOSIS — Z992 Dependence on renal dialysis: Secondary | ICD-10-CM | POA: Diagnosis not present

## 2022-05-26 DIAGNOSIS — N2581 Secondary hyperparathyroidism of renal origin: Secondary | ICD-10-CM | POA: Diagnosis not present

## 2022-05-26 DIAGNOSIS — N186 End stage renal disease: Secondary | ICD-10-CM | POA: Diagnosis not present

## 2022-05-27 DIAGNOSIS — Z992 Dependence on renal dialysis: Secondary | ICD-10-CM | POA: Diagnosis not present

## 2022-05-27 DIAGNOSIS — M3214 Glomerular disease in systemic lupus erythematosus: Secondary | ICD-10-CM | POA: Diagnosis not present

## 2022-05-27 DIAGNOSIS — N186 End stage renal disease: Secondary | ICD-10-CM | POA: Diagnosis not present

## 2022-05-28 DIAGNOSIS — N186 End stage renal disease: Secondary | ICD-10-CM | POA: Diagnosis not present

## 2022-05-28 DIAGNOSIS — Z992 Dependence on renal dialysis: Secondary | ICD-10-CM | POA: Diagnosis not present

## 2022-05-28 DIAGNOSIS — N2581 Secondary hyperparathyroidism of renal origin: Secondary | ICD-10-CM | POA: Diagnosis not present

## 2022-06-02 DIAGNOSIS — N2581 Secondary hyperparathyroidism of renal origin: Secondary | ICD-10-CM | POA: Diagnosis not present

## 2022-06-02 DIAGNOSIS — Z992 Dependence on renal dialysis: Secondary | ICD-10-CM | POA: Diagnosis not present

## 2022-06-02 DIAGNOSIS — N186 End stage renal disease: Secondary | ICD-10-CM | POA: Diagnosis not present

## 2022-06-04 DIAGNOSIS — N2581 Secondary hyperparathyroidism of renal origin: Secondary | ICD-10-CM | POA: Diagnosis not present

## 2022-06-04 DIAGNOSIS — Z992 Dependence on renal dialysis: Secondary | ICD-10-CM | POA: Diagnosis not present

## 2022-06-04 DIAGNOSIS — N186 End stage renal disease: Secondary | ICD-10-CM | POA: Diagnosis not present

## 2022-06-05 ENCOUNTER — Encounter (HOSPITAL_BASED_OUTPATIENT_CLINIC_OR_DEPARTMENT_OTHER): Payer: Self-pay | Admitting: Cardiology

## 2022-06-05 ENCOUNTER — Telehealth (INDEPENDENT_AMBULATORY_CARE_PROVIDER_SITE_OTHER): Payer: BC Managed Care – PPO | Admitting: Cardiology

## 2022-06-05 VITALS — BP 197/114 | HR 85 | Ht 71.5 in

## 2022-06-05 DIAGNOSIS — I10 Essential (primary) hypertension: Secondary | ICD-10-CM | POA: Diagnosis not present

## 2022-06-05 DIAGNOSIS — I25118 Atherosclerotic heart disease of native coronary artery with other forms of angina pectoris: Secondary | ICD-10-CM | POA: Diagnosis not present

## 2022-06-05 DIAGNOSIS — I5022 Chronic systolic (congestive) heart failure: Secondary | ICD-10-CM | POA: Diagnosis not present

## 2022-06-05 DIAGNOSIS — N186 End stage renal disease: Secondary | ICD-10-CM | POA: Diagnosis not present

## 2022-06-05 DIAGNOSIS — Z992 Dependence on renal dialysis: Secondary | ICD-10-CM

## 2022-06-05 NOTE — Progress Notes (Signed)
Virtual Visit via Video Note   Because of [name]'s co-morbid illnesses, he is at least at moderate risk for complications without adequate follow up.  This format is felt to be most appropriate for this patient at this time.  All issues noted in this document were discussed and addressed.  A limited physical exam was performed with this format.  Please refer to the patient's chart for his consent to telehealth for [facility].       Date:  06/05/2022   ID:  [name], DOB 1971-03-20, MRN [id number] The patient was identified using 2 identifiers.  Patient Location: Home Provider Location: Office/Clinic   PCP:  [name], [place] [facility] Cardiologist:  Jenean Lindau, MD     Evaluation Performed:  Follow-Up Visit  Chief Complaint:  BP follow up   History of Present Illness:    [name] is a 52 y.o. male with hypertension, congestive heart failure, lupus, ESRD (T,Th,Sa), aortic atherosclerosis, coronary calcification on CT scan.  Initially established care in 2021 with Dr. Geraldo Pitter, he was referred by his PCP for DOE.  Echo was ordered, EF 60 to 65%.   Follow-up visit with [name], NP, September 2023 with worsening DOE at the behest of his PCP after his BNP was greater than 4000, and he appeared to be volume overloaded.  His weight had been labile, he was requiring a paracentesis once every few months.  Repeat echo was ordered and showed EF 35 to 40%, LV moderately decreased function, global hypokinesis, severe concentric LVH.  RV systolic function is mildly reduced.  Moderately elevated PA pressures (54.46mHg).   He had a virtual visit with CLaurann Montana NP on 02/25/2022, his blood pressure readings at home remained elevated in the 180s over 90s.  Decision to proceed with ischemic evaluation in light of his newly reduced EF and he was agreeable to proceed with a cardiac CTA.  His Coreg was increased to 12.5  mg twice daily.  Subsequent follow-up visit with CLaurann Montanaon 04/13/2022, blood pressure was still not at goal, nephrology had increased his Coreg to 25 mg twice daily, although he had not yet started taking it and this fashion.   Today his BP is 197/114. At his HD sessions, his BP is typically 160/90 before he starts his session. Hydralazine 25 mg BID was prescribed by nephrology in December, however he has not picked it up yet. Encouraged him to pick up today if possible and start taking. He is scheduled for HD tomorrow, he did miss a session this week and his BP typically runs higher the day before he is dialyzed. He denies chest pain, palpitations, HA, blurred vision, dyspnea, pnd, orthopnea, n, v, dizziness, syncope, edema, weight gain, or early satiety.    Past Medical History:  Diagnosis Date   Chronic kidney disease    Lupus ([facility]    Thyroid disease    Past Surgical History:  Procedure Laterality Date   AV FISTULA PLACEMENT Left 08/26/2020   Procedure: LEFT RADIOCEPHALIC ARTERIOVENOUS (AV) FISTULA CREATION;  Surgeon: DAngelia Mould MD;  [place]  Service: V[place];  Laterality: Left;   IR FLUORO GUIDE CV LINE RIGHT  04/30/2020   IR PARACENTESIS  05/07/2020   IR PARACENTESIS  05/10/2020   IR PARACENTESIS  06/03/2020   IR PARACENTESIS  06/24/2020   IR PARACENTESIS  07/15/2020   IR PARACENTESIS  07/29/2020   IR PARACENTESIS  08/14/2020  IR PARACENTESIS  06/11/2021   IR PARACENTESIS  08/08/2021   IR PARACENTESIS  09/12/2021   IR PARACENTESIS  10/10/2021   IR PARACENTESIS  03/09/2022   IR PERC TUN PERIT CATH WO PORT S&I /IMAG  05/03/2020   IR US GUIDE VASC ACCESS RIGHT  04/30/2020   IR US GUIDE VASC ACCESS RIGHT  05/03/2020     Current Meds  Medication Sig   aspirin EC 81 MG tablet Take 1 tablet (81 mg total) by mouth daily. Swallow whole.   carvedilol (COREG) 25 MG tablet Take 1 tablet (25 mg total) by mouth 2 (two) times daily.   hydroxychloroquine (PLAQUENIL) 200 MG  tablet Take 200 mg by mouth 3 (three) times a week. No set days   irbesartan (AVAPRO) 300 MG tablet Take 1 tablet (300 mg total) by mouth daily.   levothyroxine (SYNTHROID) 50 MCG tablet Take 1 tablet (50 mcg total) by mouth daily before breakfast.   rosuvastatin (CRESTOR) 5 MG tablet Take 1 tablet (5 mg total) by mouth 3 (three) times a week.   [DISCONTINUED] furosemide (LASIX) 40 MG tablet Take 1 tablet (40 mg total) by mouth daily.     Allergies:   Patient has no known allergies.   Social History   Tobacco Use   Smoking status: Never   Smokeless tobacco: Never  Vaping Use   Vaping Use: Never used  Substance Use Topics   Alcohol use: Yes    Comment: 2-4 glasses of wine 2-3 times weekly   Drug use: No     Family Hx: The patient's family history includes Cancer in his mother.  ROS:   Please see the history of present illness.     All other systems reviewed and are negative.   Prior CV studies:   The following studies were reviewed today:  03/13/22 coronary CTA -  1. Coronary calcium score of 128. This was 88th percentile for age-, sex, and race-matched controls.   2.  Normal coronary origin with right dominance.   3.  Mild atherosclerosis.  CAD RADS 2.   4.  Recommend preventive therapy and risk factor modificaiton.   5.  Consider non atherosclerotic causes of chest pain  CAD-RADS 2: Mild non-obstructive CAD (25-49%). Consider non-atherosclerotic causes of chest pain. Consider preventive therapy and risk factor modification.  01/21/2022 echo complete-EF 35 to 40%, LV moderately decreased function, global hypokinesis, severe concentric LVH.  RV systolic function is mildly reduced.  Moderately elevated PA pressures (54.45mHg).  LA moderately dilated.  RA mildly dilated.  Mild MR.  Mild to moderate TR.  Mild calcification of the AV, mild AR noted.  Aortic dilatation noted, aortic root 43 mm.   Labs/Other Tests and Data Reviewed:    EKG:  No ECG reviewed.  Recent  Labs: 06/28/2021: ALT 18 11/28/2021: Brain Natriuretic Peptide 4,030; Hemoglobin 10.6; Platelets 126; TSH 5.66 01/02/2022: BUN 59; Potassium 4.0; Pro B Natriuretic peptide (BNP) >4923.0; Sodium 134 03/13/2022: Creatinine, Ser 6.10   Recent Lipid Panel Lab Results  Component Value Date/Time   CHOL 132 11/28/2021 02:41 PM   TRIG 44 11/28/2021 02:41 PM   HDL 61 11/28/2021 02:41 PM   CHOLHDL 2.2 11/28/2021 02:41 PM   LDLCALC 58 11/28/2021 02:41 PM   LDLDIRECT 39.0 11/01/2018 11:56 AM    Wt Readings from Last 3 Encounters:  01/07/22 170 lb (77.1 kg)  01/02/22 176 lb 9.6 oz (80.1 kg)  11/28/21 169 lb 6.4 oz (76.8 kg)     Risk Assessment/Calculations:  Objective:    Vital Signs:  BP (!) 197/114   Pulse 85   Ht 5' 11.5" (1.816 m)   BMI 23.38 kg/m    VITAL SIGNS:  reviewed GEN:  no acute distress RESPIRATORY:  normal respiratory effort, symmetric expansion CARDIOVASCULAR:  no peripheral edema  ASSESSMENT & PLAN:    CAD - Stable with no anginal symptoms. No indication for ischemic evaluation.  Continue ASA, coreg, rosuvastatin. Chronic systolic HF - appears euvolemic on video, was SOB at beginning of the week after missing HD, but breathing is at baseline. Lasix was d/c'd, likely by nephrology but he is not sure. Fluid volume status, managed by HD T, Th, Sat.  HTN - BP today is very elevated 197/114, denies any associated neurological symptoms. Advised him to pick up his ordered hydralazine and start today if possible. We will check in on him in 1 week, if BP remains elevated, we will increase his hydralazine dose.  ESRD on HD - Tolerating HD well, T, Th, Sat. Missed one treatment of HD this week. Lasix was d/c'd, likely by nephrology, but pt is not entirely sure.      Disposition - pick up hydralazine that was ordered by nephrology in December, and begin to take immediately. Our office will call him in 1 week to check on his BP readings. If his BP remains elevated after  starting hydralazine 25 mg BID, may need to increased to 50 mg TID. Return for VV in 2 months.        Time:   Today, I have spent 10 minutes with the patient with telehealth technology discussing the above problems.     Medication Adjustments/Labs and Tests Ordered: Current medicines are reviewed at length with the patient today.  Concerns regarding medicines are outlined above.   Tests Ordered: No orders of the defined types were placed in this encounter.   Medication Changes: No orders of the defined types were placed in this encounter.   Follow Up:  Virtual Visit  in 2 month(s)  Signed, Trudi Ida, NP  06/05/2022 1:47 PM    [facility]

## 2022-06-05 NOTE — Patient Instructions (Addendum)
Medication Instructions:  Your physician has recommended you make the following change in your medication:   Pick up Hydralazine from the pharmacy and start taking!   *If you need a refill on your cardiac medications before your next appointment, please call your pharmacy*  Follow-Up: At [facility], you and your health needs are our priority.  As part of our continuing mission to provide you with exceptional heart care, we have created designated Provider Care Teams.  These Care Teams include your primary Cardiologist (physician) and Advanced Practice Providers (APPs -  Physician Assistants and Nurse Practitioners) who all work together to provide you with the care you need, when you need it.  We recommend signing up for the patient portal called "MyChart".  Sign up information is provided on this After Visit Summary.  MyChart is used to connect with patients for Virtual Visits (Telemedicine).  Patients are able to view lab/test results, encounter notes, upcoming appointments, etc.  Non-urgent messages can be sent to your provider as well.   To learn more about what you can do with MyChart, go to [email or web address].    Your next appointment:   Monday April 8th at 1:55pm with Laurann Montana, NP- Mychart video visit

## 2022-06-06 DIAGNOSIS — N186 End stage renal disease: Secondary | ICD-10-CM | POA: Diagnosis not present

## 2022-06-06 DIAGNOSIS — N2581 Secondary hyperparathyroidism of renal origin: Secondary | ICD-10-CM | POA: Diagnosis not present

## 2022-06-06 DIAGNOSIS — Z992 Dependence on renal dialysis: Secondary | ICD-10-CM | POA: Diagnosis not present

## 2022-06-09 DIAGNOSIS — N186 End stage renal disease: Secondary | ICD-10-CM | POA: Diagnosis not present

## 2022-06-09 DIAGNOSIS — Z992 Dependence on renal dialysis: Secondary | ICD-10-CM | POA: Diagnosis not present

## 2022-06-09 DIAGNOSIS — N2581 Secondary hyperparathyroidism of renal origin: Secondary | ICD-10-CM | POA: Diagnosis not present

## 2022-06-11 ENCOUNTER — Encounter (HOSPITAL_BASED_OUTPATIENT_CLINIC_OR_DEPARTMENT_OTHER): Payer: Self-pay

## 2022-06-11 DIAGNOSIS — N186 End stage renal disease: Secondary | ICD-10-CM | POA: Diagnosis not present

## 2022-06-11 DIAGNOSIS — N2581 Secondary hyperparathyroidism of renal origin: Secondary | ICD-10-CM | POA: Diagnosis not present

## 2022-06-11 DIAGNOSIS — Z992 Dependence on renal dialysis: Secondary | ICD-10-CM | POA: Diagnosis not present

## 2022-06-13 DIAGNOSIS — N2581 Secondary hyperparathyroidism of renal origin: Secondary | ICD-10-CM | POA: Diagnosis not present

## 2022-06-13 DIAGNOSIS — N186 End stage renal disease: Secondary | ICD-10-CM | POA: Diagnosis not present

## 2022-06-13 DIAGNOSIS — Z992 Dependence on renal dialysis: Secondary | ICD-10-CM | POA: Diagnosis not present

## 2022-06-16 DIAGNOSIS — N2581 Secondary hyperparathyroidism of renal origin: Secondary | ICD-10-CM | POA: Diagnosis not present

## 2022-06-16 DIAGNOSIS — Z992 Dependence on renal dialysis: Secondary | ICD-10-CM | POA: Diagnosis not present

## 2022-06-16 DIAGNOSIS — N186 End stage renal disease: Secondary | ICD-10-CM | POA: Diagnosis not present

## 2022-06-18 DIAGNOSIS — Z992 Dependence on renal dialysis: Secondary | ICD-10-CM | POA: Diagnosis not present

## 2022-06-18 DIAGNOSIS — N2581 Secondary hyperparathyroidism of renal origin: Secondary | ICD-10-CM | POA: Diagnosis not present

## 2022-06-18 DIAGNOSIS — N186 End stage renal disease: Secondary | ICD-10-CM | POA: Diagnosis not present

## 2022-06-20 DIAGNOSIS — N186 End stage renal disease: Secondary | ICD-10-CM | POA: Diagnosis not present

## 2022-06-20 DIAGNOSIS — Z992 Dependence on renal dialysis: Secondary | ICD-10-CM | POA: Diagnosis not present

## 2022-06-20 DIAGNOSIS — N2581 Secondary hyperparathyroidism of renal origin: Secondary | ICD-10-CM | POA: Diagnosis not present

## 2022-06-23 DIAGNOSIS — N2581 Secondary hyperparathyroidism of renal origin: Secondary | ICD-10-CM | POA: Diagnosis not present

## 2022-06-23 DIAGNOSIS — N186 End stage renal disease: Secondary | ICD-10-CM | POA: Diagnosis not present

## 2022-06-23 DIAGNOSIS — Z992 Dependence on renal dialysis: Secondary | ICD-10-CM | POA: Diagnosis not present

## 2022-06-25 DIAGNOSIS — N186 End stage renal disease: Secondary | ICD-10-CM | POA: Diagnosis not present

## 2022-06-25 DIAGNOSIS — M3214 Glomerular disease in systemic lupus erythematosus: Secondary | ICD-10-CM | POA: Diagnosis not present

## 2022-06-25 DIAGNOSIS — N2581 Secondary hyperparathyroidism of renal origin: Secondary | ICD-10-CM | POA: Diagnosis not present

## 2022-06-25 DIAGNOSIS — Z992 Dependence on renal dialysis: Secondary | ICD-10-CM | POA: Diagnosis not present

## 2022-06-27 DIAGNOSIS — N2581 Secondary hyperparathyroidism of renal origin: Secondary | ICD-10-CM | POA: Diagnosis not present

## 2022-06-27 DIAGNOSIS — N186 End stage renal disease: Secondary | ICD-10-CM | POA: Diagnosis not present

## 2022-06-27 DIAGNOSIS — Z992 Dependence on renal dialysis: Secondary | ICD-10-CM | POA: Diagnosis not present

## 2022-06-30 DIAGNOSIS — N186 End stage renal disease: Secondary | ICD-10-CM | POA: Diagnosis not present

## 2022-06-30 DIAGNOSIS — Z992 Dependence on renal dialysis: Secondary | ICD-10-CM | POA: Diagnosis not present

## 2022-06-30 DIAGNOSIS — N2581 Secondary hyperparathyroidism of renal origin: Secondary | ICD-10-CM | POA: Diagnosis not present

## 2022-07-02 DIAGNOSIS — N2581 Secondary hyperparathyroidism of renal origin: Secondary | ICD-10-CM | POA: Diagnosis not present

## 2022-07-02 DIAGNOSIS — Z992 Dependence on renal dialysis: Secondary | ICD-10-CM | POA: Diagnosis not present

## 2022-07-02 DIAGNOSIS — N186 End stage renal disease: Secondary | ICD-10-CM | POA: Diagnosis not present

## 2022-07-04 DIAGNOSIS — N186 End stage renal disease: Secondary | ICD-10-CM | POA: Diagnosis not present

## 2022-07-04 DIAGNOSIS — N2581 Secondary hyperparathyroidism of renal origin: Secondary | ICD-10-CM | POA: Diagnosis not present

## 2022-07-04 DIAGNOSIS — Z992 Dependence on renal dialysis: Secondary | ICD-10-CM | POA: Diagnosis not present

## 2022-07-07 DIAGNOSIS — Z992 Dependence on renal dialysis: Secondary | ICD-10-CM | POA: Diagnosis not present

## 2022-07-07 DIAGNOSIS — N2581 Secondary hyperparathyroidism of renal origin: Secondary | ICD-10-CM | POA: Diagnosis not present

## 2022-07-07 DIAGNOSIS — N186 End stage renal disease: Secondary | ICD-10-CM | POA: Diagnosis not present

## 2022-07-09 DIAGNOSIS — N186 End stage renal disease: Secondary | ICD-10-CM | POA: Diagnosis not present

## 2022-07-09 DIAGNOSIS — N2581 Secondary hyperparathyroidism of renal origin: Secondary | ICD-10-CM | POA: Diagnosis not present

## 2022-07-09 DIAGNOSIS — Z992 Dependence on renal dialysis: Secondary | ICD-10-CM | POA: Diagnosis not present

## 2022-07-11 DIAGNOSIS — Z992 Dependence on renal dialysis: Secondary | ICD-10-CM | POA: Diagnosis not present

## 2022-07-11 DIAGNOSIS — N2581 Secondary hyperparathyroidism of renal origin: Secondary | ICD-10-CM | POA: Diagnosis not present

## 2022-07-11 DIAGNOSIS — N186 End stage renal disease: Secondary | ICD-10-CM | POA: Diagnosis not present

## 2022-07-14 DIAGNOSIS — N2581 Secondary hyperparathyroidism of renal origin: Secondary | ICD-10-CM | POA: Diagnosis not present

## 2022-07-14 DIAGNOSIS — Z992 Dependence on renal dialysis: Secondary | ICD-10-CM | POA: Diagnosis not present

## 2022-07-14 DIAGNOSIS — N186 End stage renal disease: Secondary | ICD-10-CM | POA: Diagnosis not present

## 2022-07-18 DIAGNOSIS — Z992 Dependence on renal dialysis: Secondary | ICD-10-CM | POA: Diagnosis not present

## 2022-07-18 DIAGNOSIS — N186 End stage renal disease: Secondary | ICD-10-CM | POA: Diagnosis not present

## 2022-07-18 DIAGNOSIS — N2581 Secondary hyperparathyroidism of renal origin: Secondary | ICD-10-CM | POA: Diagnosis not present

## 2022-07-21 DIAGNOSIS — Z992 Dependence on renal dialysis: Secondary | ICD-10-CM | POA: Diagnosis not present

## 2022-07-21 DIAGNOSIS — N186 End stage renal disease: Secondary | ICD-10-CM | POA: Diagnosis not present

## 2022-07-21 DIAGNOSIS — N2581 Secondary hyperparathyroidism of renal origin: Secondary | ICD-10-CM | POA: Diagnosis not present

## 2022-07-23 DIAGNOSIS — N186 End stage renal disease: Secondary | ICD-10-CM | POA: Diagnosis not present

## 2022-07-23 DIAGNOSIS — N2581 Secondary hyperparathyroidism of renal origin: Secondary | ICD-10-CM | POA: Diagnosis not present

## 2022-07-23 DIAGNOSIS — Z992 Dependence on renal dialysis: Secondary | ICD-10-CM | POA: Diagnosis not present

## 2022-07-25 DIAGNOSIS — N2581 Secondary hyperparathyroidism of renal origin: Secondary | ICD-10-CM | POA: Diagnosis not present

## 2022-07-25 DIAGNOSIS — Z992 Dependence on renal dialysis: Secondary | ICD-10-CM | POA: Diagnosis not present

## 2022-07-25 DIAGNOSIS — N186 End stage renal disease: Secondary | ICD-10-CM | POA: Diagnosis not present

## 2022-07-26 DIAGNOSIS — N186 End stage renal disease: Secondary | ICD-10-CM | POA: Diagnosis not present

## 2022-07-26 DIAGNOSIS — Z992 Dependence on renal dialysis: Secondary | ICD-10-CM | POA: Diagnosis not present

## 2022-07-26 DIAGNOSIS — M3214 Glomerular disease in systemic lupus erythematosus: Secondary | ICD-10-CM | POA: Diagnosis not present

## 2022-07-28 DIAGNOSIS — N2581 Secondary hyperparathyroidism of renal origin: Secondary | ICD-10-CM | POA: Diagnosis not present

## 2022-07-28 DIAGNOSIS — Z992 Dependence on renal dialysis: Secondary | ICD-10-CM | POA: Diagnosis not present

## 2022-07-28 DIAGNOSIS — N186 End stage renal disease: Secondary | ICD-10-CM | POA: Diagnosis not present

## 2022-08-01 DIAGNOSIS — N2581 Secondary hyperparathyroidism of renal origin: Secondary | ICD-10-CM | POA: Diagnosis not present

## 2022-08-01 DIAGNOSIS — N186 End stage renal disease: Secondary | ICD-10-CM | POA: Diagnosis not present

## 2022-08-01 DIAGNOSIS — Z992 Dependence on renal dialysis: Secondary | ICD-10-CM | POA: Diagnosis not present

## 2022-08-03 ENCOUNTER — Telehealth (HOSPITAL_BASED_OUTPATIENT_CLINIC_OR_DEPARTMENT_OTHER): Payer: Self-pay

## 2022-08-03 ENCOUNTER — Telehealth (HOSPITAL_BASED_OUTPATIENT_CLINIC_OR_DEPARTMENT_OTHER): Payer: BC Managed Care – PPO | Admitting: Family

## 2022-08-03 NOTE — Telephone Encounter (Signed)
Attempted to call pt and have left 2 messages concerning video visit scheduled today at 1:55 with Gillian Shields, NP.

## 2022-08-03 NOTE — Telephone Encounter (Signed)
Attempted to reach pt for third time with no answer.

## 2022-08-04 ENCOUNTER — Telehealth: Payer: Self-pay

## 2022-08-04 DIAGNOSIS — N2581 Secondary hyperparathyroidism of renal origin: Secondary | ICD-10-CM | POA: Diagnosis not present

## 2022-08-04 DIAGNOSIS — N186 End stage renal disease: Secondary | ICD-10-CM | POA: Diagnosis not present

## 2022-08-04 DIAGNOSIS — Z992 Dependence on renal dialysis: Secondary | ICD-10-CM | POA: Diagnosis not present

## 2022-08-04 NOTE — Patient Outreach (Signed)
  Care Coordination   08/04/2022 Name: [name] MRN: [id number] DOB: Nov 13, 1970   Care Coordination Outreach Attempts:  An unsuccessful telephone outreach was attempted today to offer the patient information about available care coordination services as a benefit of their health plan.   Follow Up Plan:  Additional outreach attempts will be made to offer the patient care coordination information and services.   Encounter Outcome:  No Answer   Care Coordination Interventions:  No, not indicated    Bary Leriche, RN, MSN [facility] Care Management Care Management Coordinator Direct Line [phone]

## 2022-08-06 DIAGNOSIS — N2581 Secondary hyperparathyroidism of renal origin: Secondary | ICD-10-CM | POA: Diagnosis not present

## 2022-08-06 DIAGNOSIS — Z992 Dependence on renal dialysis: Secondary | ICD-10-CM | POA: Diagnosis not present

## 2022-08-06 DIAGNOSIS — N186 End stage renal disease: Secondary | ICD-10-CM | POA: Diagnosis not present

## 2022-08-08 DIAGNOSIS — Z992 Dependence on renal dialysis: Secondary | ICD-10-CM | POA: Diagnosis not present

## 2022-08-08 DIAGNOSIS — N2581 Secondary hyperparathyroidism of renal origin: Secondary | ICD-10-CM | POA: Diagnosis not present

## 2022-08-08 DIAGNOSIS — N186 End stage renal disease: Secondary | ICD-10-CM | POA: Diagnosis not present

## 2022-08-11 DIAGNOSIS — N186 End stage renal disease: Secondary | ICD-10-CM | POA: Diagnosis not present

## 2022-08-11 DIAGNOSIS — N2581 Secondary hyperparathyroidism of renal origin: Secondary | ICD-10-CM | POA: Diagnosis not present

## 2022-08-11 DIAGNOSIS — Z992 Dependence on renal dialysis: Secondary | ICD-10-CM | POA: Diagnosis not present

## 2022-08-13 DIAGNOSIS — N186 End stage renal disease: Secondary | ICD-10-CM | POA: Diagnosis not present

## 2022-08-13 DIAGNOSIS — Z992 Dependence on renal dialysis: Secondary | ICD-10-CM | POA: Diagnosis not present

## 2022-08-13 DIAGNOSIS — N2581 Secondary hyperparathyroidism of renal origin: Secondary | ICD-10-CM | POA: Diagnosis not present

## 2022-08-15 DIAGNOSIS — N2581 Secondary hyperparathyroidism of renal origin: Secondary | ICD-10-CM | POA: Diagnosis not present

## 2022-08-15 DIAGNOSIS — Z992 Dependence on renal dialysis: Secondary | ICD-10-CM | POA: Diagnosis not present

## 2022-08-15 DIAGNOSIS — N186 End stage renal disease: Secondary | ICD-10-CM | POA: Diagnosis not present

## 2022-08-20 DIAGNOSIS — N2581 Secondary hyperparathyroidism of renal origin: Secondary | ICD-10-CM | POA: Diagnosis not present

## 2022-08-20 DIAGNOSIS — Z992 Dependence on renal dialysis: Secondary | ICD-10-CM | POA: Diagnosis not present

## 2022-08-20 DIAGNOSIS — N186 End stage renal disease: Secondary | ICD-10-CM | POA: Diagnosis not present

## 2022-08-22 DIAGNOSIS — N186 End stage renal disease: Secondary | ICD-10-CM | POA: Diagnosis not present

## 2022-08-22 DIAGNOSIS — N2581 Secondary hyperparathyroidism of renal origin: Secondary | ICD-10-CM | POA: Diagnosis not present

## 2022-08-22 DIAGNOSIS — Z992 Dependence on renal dialysis: Secondary | ICD-10-CM | POA: Diagnosis not present

## 2022-08-25 DIAGNOSIS — M3214 Glomerular disease in systemic lupus erythematosus: Secondary | ICD-10-CM | POA: Diagnosis not present

## 2022-08-25 DIAGNOSIS — N2581 Secondary hyperparathyroidism of renal origin: Secondary | ICD-10-CM | POA: Diagnosis not present

## 2022-08-25 DIAGNOSIS — Z992 Dependence on renal dialysis: Secondary | ICD-10-CM | POA: Diagnosis not present

## 2022-08-25 DIAGNOSIS — N186 End stage renal disease: Secondary | ICD-10-CM | POA: Diagnosis not present

## 2022-08-27 DIAGNOSIS — Z992 Dependence on renal dialysis: Secondary | ICD-10-CM | POA: Diagnosis not present

## 2022-08-27 DIAGNOSIS — N2581 Secondary hyperparathyroidism of renal origin: Secondary | ICD-10-CM | POA: Diagnosis not present

## 2022-08-27 DIAGNOSIS — N186 End stage renal disease: Secondary | ICD-10-CM | POA: Diagnosis not present

## 2022-08-29 DIAGNOSIS — N2581 Secondary hyperparathyroidism of renal origin: Secondary | ICD-10-CM | POA: Diagnosis not present

## 2022-08-29 DIAGNOSIS — Z992 Dependence on renal dialysis: Secondary | ICD-10-CM | POA: Diagnosis not present

## 2022-08-29 DIAGNOSIS — N186 End stage renal disease: Secondary | ICD-10-CM | POA: Diagnosis not present

## 2022-09-01 DIAGNOSIS — N2581 Secondary hyperparathyroidism of renal origin: Secondary | ICD-10-CM | POA: Diagnosis not present

## 2022-09-01 DIAGNOSIS — Z992 Dependence on renal dialysis: Secondary | ICD-10-CM | POA: Diagnosis not present

## 2022-09-01 DIAGNOSIS — N186 End stage renal disease: Secondary | ICD-10-CM | POA: Diagnosis not present

## 2022-09-02 ENCOUNTER — Other Ambulatory Visit (HOSPITAL_COMMUNITY): Payer: Self-pay | Admitting: Nephrology

## 2022-09-02 DIAGNOSIS — R188 Other ascites: Secondary | ICD-10-CM

## 2022-09-03 DIAGNOSIS — N2581 Secondary hyperparathyroidism of renal origin: Secondary | ICD-10-CM | POA: Diagnosis not present

## 2022-09-03 DIAGNOSIS — Z992 Dependence on renal dialysis: Secondary | ICD-10-CM | POA: Diagnosis not present

## 2022-09-03 DIAGNOSIS — N186 End stage renal disease: Secondary | ICD-10-CM | POA: Diagnosis not present

## 2022-09-04 ENCOUNTER — Ambulatory Visit (HOSPITAL_COMMUNITY): Payer: BC Managed Care – PPO

## 2022-09-04 ENCOUNTER — Encounter (HOSPITAL_COMMUNITY): Payer: Self-pay

## 2022-09-05 DIAGNOSIS — Z992 Dependence on renal dialysis: Secondary | ICD-10-CM | POA: Diagnosis not present

## 2022-09-05 DIAGNOSIS — N2581 Secondary hyperparathyroidism of renal origin: Secondary | ICD-10-CM | POA: Diagnosis not present

## 2022-09-05 DIAGNOSIS — N186 End stage renal disease: Secondary | ICD-10-CM | POA: Diagnosis not present

## 2022-09-08 DIAGNOSIS — Z992 Dependence on renal dialysis: Secondary | ICD-10-CM | POA: Diagnosis not present

## 2022-09-08 DIAGNOSIS — N186 End stage renal disease: Secondary | ICD-10-CM | POA: Diagnosis not present

## 2022-09-08 DIAGNOSIS — N2581 Secondary hyperparathyroidism of renal origin: Secondary | ICD-10-CM | POA: Diagnosis not present

## 2022-09-12 DIAGNOSIS — Z992 Dependence on renal dialysis: Secondary | ICD-10-CM | POA: Diagnosis not present

## 2022-09-12 DIAGNOSIS — N2581 Secondary hyperparathyroidism of renal origin: Secondary | ICD-10-CM | POA: Diagnosis not present

## 2022-09-12 DIAGNOSIS — N186 End stage renal disease: Secondary | ICD-10-CM | POA: Diagnosis not present

## 2022-09-15 DIAGNOSIS — N186 End stage renal disease: Secondary | ICD-10-CM | POA: Diagnosis not present

## 2022-09-15 DIAGNOSIS — N2581 Secondary hyperparathyroidism of renal origin: Secondary | ICD-10-CM | POA: Diagnosis not present

## 2022-09-15 DIAGNOSIS — Z992 Dependence on renal dialysis: Secondary | ICD-10-CM | POA: Diagnosis not present

## 2022-09-17 DIAGNOSIS — N186 End stage renal disease: Secondary | ICD-10-CM | POA: Diagnosis not present

## 2022-09-17 DIAGNOSIS — Z992 Dependence on renal dialysis: Secondary | ICD-10-CM | POA: Diagnosis not present

## 2022-09-17 DIAGNOSIS — N2581 Secondary hyperparathyroidism of renal origin: Secondary | ICD-10-CM | POA: Diagnosis not present

## 2022-09-19 DIAGNOSIS — N2581 Secondary hyperparathyroidism of renal origin: Secondary | ICD-10-CM | POA: Diagnosis not present

## 2022-09-19 DIAGNOSIS — N186 End stage renal disease: Secondary | ICD-10-CM | POA: Diagnosis not present

## 2022-09-19 DIAGNOSIS — Z992 Dependence on renal dialysis: Secondary | ICD-10-CM | POA: Diagnosis not present

## 2022-09-22 DIAGNOSIS — N2581 Secondary hyperparathyroidism of renal origin: Secondary | ICD-10-CM | POA: Diagnosis not present

## 2022-09-22 DIAGNOSIS — N186 End stage renal disease: Secondary | ICD-10-CM | POA: Diagnosis not present

## 2022-09-22 DIAGNOSIS — Z992 Dependence on renal dialysis: Secondary | ICD-10-CM | POA: Diagnosis not present

## 2022-09-24 DIAGNOSIS — Z992 Dependence on renal dialysis: Secondary | ICD-10-CM | POA: Diagnosis not present

## 2022-09-24 DIAGNOSIS — N186 End stage renal disease: Secondary | ICD-10-CM | POA: Diagnosis not present

## 2022-09-24 DIAGNOSIS — N2581 Secondary hyperparathyroidism of renal origin: Secondary | ICD-10-CM | POA: Diagnosis not present

## 2022-09-25 DIAGNOSIS — Z992 Dependence on renal dialysis: Secondary | ICD-10-CM | POA: Diagnosis not present

## 2022-09-25 DIAGNOSIS — N186 End stage renal disease: Secondary | ICD-10-CM | POA: Diagnosis not present

## 2022-09-25 DIAGNOSIS — M3214 Glomerular disease in systemic lupus erythematosus: Secondary | ICD-10-CM | POA: Diagnosis not present

## 2022-09-26 DIAGNOSIS — Z992 Dependence on renal dialysis: Secondary | ICD-10-CM | POA: Diagnosis not present

## 2022-09-26 DIAGNOSIS — N186 End stage renal disease: Secondary | ICD-10-CM | POA: Diagnosis not present

## 2022-09-26 DIAGNOSIS — N2581 Secondary hyperparathyroidism of renal origin: Secondary | ICD-10-CM | POA: Diagnosis not present

## 2022-09-29 DIAGNOSIS — Z992 Dependence on renal dialysis: Secondary | ICD-10-CM | POA: Diagnosis not present

## 2022-09-29 DIAGNOSIS — N2581 Secondary hyperparathyroidism of renal origin: Secondary | ICD-10-CM | POA: Diagnosis not present

## 2022-09-29 DIAGNOSIS — N186 End stage renal disease: Secondary | ICD-10-CM | POA: Diagnosis not present

## 2022-10-01 DIAGNOSIS — N186 End stage renal disease: Secondary | ICD-10-CM | POA: Diagnosis not present

## 2022-10-01 DIAGNOSIS — N2581 Secondary hyperparathyroidism of renal origin: Secondary | ICD-10-CM | POA: Diagnosis not present

## 2022-10-01 DIAGNOSIS — Z992 Dependence on renal dialysis: Secondary | ICD-10-CM | POA: Diagnosis not present

## 2022-10-03 DIAGNOSIS — N186 End stage renal disease: Secondary | ICD-10-CM | POA: Diagnosis not present

## 2022-10-03 DIAGNOSIS — Z992 Dependence on renal dialysis: Secondary | ICD-10-CM | POA: Diagnosis not present

## 2022-10-03 DIAGNOSIS — N2581 Secondary hyperparathyroidism of renal origin: Secondary | ICD-10-CM | POA: Diagnosis not present

## 2022-10-06 DIAGNOSIS — Z992 Dependence on renal dialysis: Secondary | ICD-10-CM | POA: Diagnosis not present

## 2022-10-06 DIAGNOSIS — N2581 Secondary hyperparathyroidism of renal origin: Secondary | ICD-10-CM | POA: Diagnosis not present

## 2022-10-06 DIAGNOSIS — N186 End stage renal disease: Secondary | ICD-10-CM | POA: Diagnosis not present

## 2022-10-08 DIAGNOSIS — N186 End stage renal disease: Secondary | ICD-10-CM | POA: Diagnosis not present

## 2022-10-08 DIAGNOSIS — N2581 Secondary hyperparathyroidism of renal origin: Secondary | ICD-10-CM | POA: Diagnosis not present

## 2022-10-08 DIAGNOSIS — Z992 Dependence on renal dialysis: Secondary | ICD-10-CM | POA: Diagnosis not present

## 2022-10-10 DIAGNOSIS — Z992 Dependence on renal dialysis: Secondary | ICD-10-CM | POA: Diagnosis not present

## 2022-10-10 DIAGNOSIS — N2581 Secondary hyperparathyroidism of renal origin: Secondary | ICD-10-CM | POA: Diagnosis not present

## 2022-10-10 DIAGNOSIS — N186 End stage renal disease: Secondary | ICD-10-CM | POA: Diagnosis not present

## 2022-10-13 DIAGNOSIS — N186 End stage renal disease: Secondary | ICD-10-CM | POA: Diagnosis not present

## 2022-10-13 DIAGNOSIS — N2581 Secondary hyperparathyroidism of renal origin: Secondary | ICD-10-CM | POA: Diagnosis not present

## 2022-10-13 DIAGNOSIS — Z992 Dependence on renal dialysis: Secondary | ICD-10-CM | POA: Diagnosis not present

## 2022-10-15 DIAGNOSIS — Z992 Dependence on renal dialysis: Secondary | ICD-10-CM | POA: Diagnosis not present

## 2022-10-15 DIAGNOSIS — N2581 Secondary hyperparathyroidism of renal origin: Secondary | ICD-10-CM | POA: Diagnosis not present

## 2022-10-15 DIAGNOSIS — N186 End stage renal disease: Secondary | ICD-10-CM | POA: Diagnosis not present

## 2022-10-17 DIAGNOSIS — N2581 Secondary hyperparathyroidism of renal origin: Secondary | ICD-10-CM | POA: Diagnosis not present

## 2022-10-17 DIAGNOSIS — Z992 Dependence on renal dialysis: Secondary | ICD-10-CM | POA: Diagnosis not present

## 2022-10-17 DIAGNOSIS — N186 End stage renal disease: Secondary | ICD-10-CM | POA: Diagnosis not present

## 2022-10-20 DIAGNOSIS — Z992 Dependence on renal dialysis: Secondary | ICD-10-CM | POA: Diagnosis not present

## 2022-10-20 DIAGNOSIS — N2581 Secondary hyperparathyroidism of renal origin: Secondary | ICD-10-CM | POA: Diagnosis not present

## 2022-10-20 DIAGNOSIS — N186 End stage renal disease: Secondary | ICD-10-CM | POA: Diagnosis not present

## 2022-10-22 DIAGNOSIS — Z992 Dependence on renal dialysis: Secondary | ICD-10-CM | POA: Diagnosis not present

## 2022-10-22 DIAGNOSIS — N2581 Secondary hyperparathyroidism of renal origin: Secondary | ICD-10-CM | POA: Diagnosis not present

## 2022-10-22 DIAGNOSIS — N186 End stage renal disease: Secondary | ICD-10-CM | POA: Diagnosis not present

## 2022-10-24 DIAGNOSIS — N186 End stage renal disease: Secondary | ICD-10-CM | POA: Diagnosis not present

## 2022-10-24 DIAGNOSIS — N2581 Secondary hyperparathyroidism of renal origin: Secondary | ICD-10-CM | POA: Diagnosis not present

## 2022-10-24 DIAGNOSIS — Z992 Dependence on renal dialysis: Secondary | ICD-10-CM | POA: Diagnosis not present

## 2022-10-25 DIAGNOSIS — M3214 Glomerular disease in systemic lupus erythematosus: Secondary | ICD-10-CM | POA: Diagnosis not present

## 2022-10-25 DIAGNOSIS — N186 End stage renal disease: Secondary | ICD-10-CM | POA: Diagnosis not present

## 2022-10-25 DIAGNOSIS — Z992 Dependence on renal dialysis: Secondary | ICD-10-CM | POA: Diagnosis not present

## 2022-10-27 DIAGNOSIS — Z992 Dependence on renal dialysis: Secondary | ICD-10-CM | POA: Diagnosis not present

## 2022-10-27 DIAGNOSIS — N186 End stage renal disease: Secondary | ICD-10-CM | POA: Diagnosis not present

## 2022-10-27 DIAGNOSIS — N2581 Secondary hyperparathyroidism of renal origin: Secondary | ICD-10-CM | POA: Diagnosis not present

## 2022-10-31 DIAGNOSIS — N186 End stage renal disease: Secondary | ICD-10-CM | POA: Diagnosis not present

## 2022-10-31 DIAGNOSIS — Z992 Dependence on renal dialysis: Secondary | ICD-10-CM | POA: Diagnosis not present

## 2022-10-31 DIAGNOSIS — N2581 Secondary hyperparathyroidism of renal origin: Secondary | ICD-10-CM | POA: Diagnosis not present

## 2022-11-03 DIAGNOSIS — Z992 Dependence on renal dialysis: Secondary | ICD-10-CM | POA: Diagnosis not present

## 2022-11-03 DIAGNOSIS — N186 End stage renal disease: Secondary | ICD-10-CM | POA: Diagnosis not present

## 2022-11-03 DIAGNOSIS — N2581 Secondary hyperparathyroidism of renal origin: Secondary | ICD-10-CM | POA: Diagnosis not present

## 2022-11-05 DIAGNOSIS — N186 End stage renal disease: Secondary | ICD-10-CM | POA: Diagnosis not present

## 2022-11-05 DIAGNOSIS — N2581 Secondary hyperparathyroidism of renal origin: Secondary | ICD-10-CM | POA: Diagnosis not present

## 2022-11-05 DIAGNOSIS — Z992 Dependence on renal dialysis: Secondary | ICD-10-CM | POA: Diagnosis not present

## 2022-11-07 DIAGNOSIS — Z992 Dependence on renal dialysis: Secondary | ICD-10-CM | POA: Diagnosis not present

## 2022-11-07 DIAGNOSIS — N2581 Secondary hyperparathyroidism of renal origin: Secondary | ICD-10-CM | POA: Diagnosis not present

## 2022-11-07 DIAGNOSIS — N186 End stage renal disease: Secondary | ICD-10-CM | POA: Diagnosis not present

## 2022-11-10 DIAGNOSIS — N2581 Secondary hyperparathyroidism of renal origin: Secondary | ICD-10-CM | POA: Diagnosis not present

## 2022-11-10 DIAGNOSIS — Z992 Dependence on renal dialysis: Secondary | ICD-10-CM | POA: Diagnosis not present

## 2022-11-10 DIAGNOSIS — N186 End stage renal disease: Secondary | ICD-10-CM | POA: Diagnosis not present

## 2022-11-12 DIAGNOSIS — Z992 Dependence on renal dialysis: Secondary | ICD-10-CM | POA: Diagnosis not present

## 2022-11-12 DIAGNOSIS — N2581 Secondary hyperparathyroidism of renal origin: Secondary | ICD-10-CM | POA: Diagnosis not present

## 2022-11-12 DIAGNOSIS — N186 End stage renal disease: Secondary | ICD-10-CM | POA: Diagnosis not present

## 2022-11-14 DIAGNOSIS — N186 End stage renal disease: Secondary | ICD-10-CM | POA: Diagnosis not present

## 2022-11-14 DIAGNOSIS — Z992 Dependence on renal dialysis: Secondary | ICD-10-CM | POA: Diagnosis not present

## 2022-11-14 DIAGNOSIS — N2581 Secondary hyperparathyroidism of renal origin: Secondary | ICD-10-CM | POA: Diagnosis not present

## 2022-11-17 DIAGNOSIS — Z992 Dependence on renal dialysis: Secondary | ICD-10-CM | POA: Diagnosis not present

## 2022-11-17 DIAGNOSIS — N2581 Secondary hyperparathyroidism of renal origin: Secondary | ICD-10-CM | POA: Diagnosis not present

## 2022-11-17 DIAGNOSIS — N186 End stage renal disease: Secondary | ICD-10-CM | POA: Diagnosis not present

## 2022-11-19 DIAGNOSIS — N2581 Secondary hyperparathyroidism of renal origin: Secondary | ICD-10-CM | POA: Diagnosis not present

## 2022-11-19 DIAGNOSIS — Z992 Dependence on renal dialysis: Secondary | ICD-10-CM | POA: Diagnosis not present

## 2022-11-19 DIAGNOSIS — N186 End stage renal disease: Secondary | ICD-10-CM | POA: Diagnosis not present

## 2022-11-21 DIAGNOSIS — Z992 Dependence on renal dialysis: Secondary | ICD-10-CM | POA: Diagnosis not present

## 2022-11-21 DIAGNOSIS — N2581 Secondary hyperparathyroidism of renal origin: Secondary | ICD-10-CM | POA: Diagnosis not present

## 2022-11-21 DIAGNOSIS — N186 End stage renal disease: Secondary | ICD-10-CM | POA: Diagnosis not present

## 2022-11-24 DIAGNOSIS — N2581 Secondary hyperparathyroidism of renal origin: Secondary | ICD-10-CM | POA: Diagnosis not present

## 2022-11-24 DIAGNOSIS — N186 End stage renal disease: Secondary | ICD-10-CM | POA: Diagnosis not present

## 2022-11-24 DIAGNOSIS — Z992 Dependence on renal dialysis: Secondary | ICD-10-CM | POA: Diagnosis not present

## 2022-11-25 DIAGNOSIS — Z992 Dependence on renal dialysis: Secondary | ICD-10-CM | POA: Diagnosis not present

## 2022-11-25 DIAGNOSIS — M3214 Glomerular disease in systemic lupus erythematosus: Secondary | ICD-10-CM | POA: Diagnosis not present

## 2022-11-25 DIAGNOSIS — N186 End stage renal disease: Secondary | ICD-10-CM | POA: Diagnosis not present

## 2022-11-26 DIAGNOSIS — N186 End stage renal disease: Secondary | ICD-10-CM | POA: Diagnosis not present

## 2022-11-26 DIAGNOSIS — N2581 Secondary hyperparathyroidism of renal origin: Secondary | ICD-10-CM | POA: Diagnosis not present

## 2022-11-26 DIAGNOSIS — Z992 Dependence on renal dialysis: Secondary | ICD-10-CM | POA: Diagnosis not present

## 2022-11-28 DIAGNOSIS — Z992 Dependence on renal dialysis: Secondary | ICD-10-CM | POA: Diagnosis not present

## 2022-11-28 DIAGNOSIS — N2581 Secondary hyperparathyroidism of renal origin: Secondary | ICD-10-CM | POA: Diagnosis not present

## 2022-11-28 DIAGNOSIS — N186 End stage renal disease: Secondary | ICD-10-CM | POA: Diagnosis not present

## 2022-12-01 DIAGNOSIS — Z992 Dependence on renal dialysis: Secondary | ICD-10-CM | POA: Diagnosis not present

## 2022-12-01 DIAGNOSIS — N2581 Secondary hyperparathyroidism of renal origin: Secondary | ICD-10-CM | POA: Diagnosis not present

## 2022-12-01 DIAGNOSIS — N186 End stage renal disease: Secondary | ICD-10-CM | POA: Diagnosis not present

## 2022-12-03 DIAGNOSIS — N186 End stage renal disease: Secondary | ICD-10-CM | POA: Diagnosis not present

## 2022-12-03 DIAGNOSIS — N2581 Secondary hyperparathyroidism of renal origin: Secondary | ICD-10-CM | POA: Diagnosis not present

## 2022-12-03 DIAGNOSIS — Z992 Dependence on renal dialysis: Secondary | ICD-10-CM | POA: Diagnosis not present

## 2022-12-08 DIAGNOSIS — N2581 Secondary hyperparathyroidism of renal origin: Secondary | ICD-10-CM | POA: Diagnosis not present

## 2022-12-08 DIAGNOSIS — Z992 Dependence on renal dialysis: Secondary | ICD-10-CM | POA: Diagnosis not present

## 2022-12-08 DIAGNOSIS — N186 End stage renal disease: Secondary | ICD-10-CM | POA: Diagnosis not present

## 2022-12-10 DIAGNOSIS — N2581 Secondary hyperparathyroidism of renal origin: Secondary | ICD-10-CM | POA: Diagnosis not present

## 2022-12-10 DIAGNOSIS — N186 End stage renal disease: Secondary | ICD-10-CM | POA: Diagnosis not present

## 2022-12-10 DIAGNOSIS — Z992 Dependence on renal dialysis: Secondary | ICD-10-CM | POA: Diagnosis not present

## 2022-12-12 DIAGNOSIS — Z992 Dependence on renal dialysis: Secondary | ICD-10-CM | POA: Diagnosis not present

## 2022-12-12 DIAGNOSIS — N2581 Secondary hyperparathyroidism of renal origin: Secondary | ICD-10-CM | POA: Diagnosis not present

## 2022-12-12 DIAGNOSIS — N186 End stage renal disease: Secondary | ICD-10-CM | POA: Diagnosis not present

## 2022-12-15 DIAGNOSIS — N2581 Secondary hyperparathyroidism of renal origin: Secondary | ICD-10-CM | POA: Diagnosis not present

## 2022-12-15 DIAGNOSIS — Z992 Dependence on renal dialysis: Secondary | ICD-10-CM | POA: Diagnosis not present

## 2022-12-15 DIAGNOSIS — N186 End stage renal disease: Secondary | ICD-10-CM | POA: Diagnosis not present

## 2022-12-17 DIAGNOSIS — Z992 Dependence on renal dialysis: Secondary | ICD-10-CM | POA: Diagnosis not present

## 2022-12-17 DIAGNOSIS — N2581 Secondary hyperparathyroidism of renal origin: Secondary | ICD-10-CM | POA: Diagnosis not present

## 2022-12-17 DIAGNOSIS — N186 End stage renal disease: Secondary | ICD-10-CM | POA: Diagnosis not present

## 2022-12-19 DIAGNOSIS — Z992 Dependence on renal dialysis: Secondary | ICD-10-CM | POA: Diagnosis not present

## 2022-12-19 DIAGNOSIS — N2581 Secondary hyperparathyroidism of renal origin: Secondary | ICD-10-CM | POA: Diagnosis not present

## 2022-12-19 DIAGNOSIS — N186 End stage renal disease: Secondary | ICD-10-CM | POA: Diagnosis not present

## 2022-12-22 DIAGNOSIS — Z992 Dependence on renal dialysis: Secondary | ICD-10-CM | POA: Diagnosis not present

## 2022-12-22 DIAGNOSIS — N186 End stage renal disease: Secondary | ICD-10-CM | POA: Diagnosis not present

## 2022-12-22 DIAGNOSIS — N2581 Secondary hyperparathyroidism of renal origin: Secondary | ICD-10-CM | POA: Diagnosis not present

## 2022-12-24 DIAGNOSIS — N2581 Secondary hyperparathyroidism of renal origin: Secondary | ICD-10-CM | POA: Diagnosis not present

## 2022-12-24 DIAGNOSIS — Z992 Dependence on renal dialysis: Secondary | ICD-10-CM | POA: Diagnosis not present

## 2022-12-24 DIAGNOSIS — N186 End stage renal disease: Secondary | ICD-10-CM | POA: Diagnosis not present

## 2022-12-26 DIAGNOSIS — N2581 Secondary hyperparathyroidism of renal origin: Secondary | ICD-10-CM | POA: Diagnosis not present

## 2022-12-26 DIAGNOSIS — M3214 Glomerular disease in systemic lupus erythematosus: Secondary | ICD-10-CM | POA: Diagnosis not present

## 2022-12-26 DIAGNOSIS — Z992 Dependence on renal dialysis: Secondary | ICD-10-CM | POA: Diagnosis not present

## 2022-12-26 DIAGNOSIS — N186 End stage renal disease: Secondary | ICD-10-CM | POA: Diagnosis not present

## 2022-12-29 DIAGNOSIS — N186 End stage renal disease: Secondary | ICD-10-CM | POA: Diagnosis not present

## 2022-12-29 DIAGNOSIS — Z992 Dependence on renal dialysis: Secondary | ICD-10-CM | POA: Diagnosis not present

## 2022-12-29 DIAGNOSIS — N2581 Secondary hyperparathyroidism of renal origin: Secondary | ICD-10-CM | POA: Diagnosis not present

## 2022-12-31 DIAGNOSIS — Z992 Dependence on renal dialysis: Secondary | ICD-10-CM | POA: Diagnosis not present

## 2022-12-31 DIAGNOSIS — N2581 Secondary hyperparathyroidism of renal origin: Secondary | ICD-10-CM | POA: Diagnosis not present

## 2022-12-31 DIAGNOSIS — N186 End stage renal disease: Secondary | ICD-10-CM | POA: Diagnosis not present

## 2023-01-02 DIAGNOSIS — Z992 Dependence on renal dialysis: Secondary | ICD-10-CM | POA: Diagnosis not present

## 2023-01-02 DIAGNOSIS — N186 End stage renal disease: Secondary | ICD-10-CM | POA: Diagnosis not present

## 2023-01-02 DIAGNOSIS — N2581 Secondary hyperparathyroidism of renal origin: Secondary | ICD-10-CM | POA: Diagnosis not present

## 2023-01-05 DIAGNOSIS — N2581 Secondary hyperparathyroidism of renal origin: Secondary | ICD-10-CM | POA: Diagnosis not present

## 2023-01-05 DIAGNOSIS — N186 End stage renal disease: Secondary | ICD-10-CM | POA: Diagnosis not present

## 2023-01-05 DIAGNOSIS — Z992 Dependence on renal dialysis: Secondary | ICD-10-CM | POA: Diagnosis not present

## 2023-01-07 DIAGNOSIS — N2581 Secondary hyperparathyroidism of renal origin: Secondary | ICD-10-CM | POA: Diagnosis not present

## 2023-01-07 DIAGNOSIS — N186 End stage renal disease: Secondary | ICD-10-CM | POA: Diagnosis not present

## 2023-01-07 DIAGNOSIS — Z992 Dependence on renal dialysis: Secondary | ICD-10-CM | POA: Diagnosis not present

## 2023-01-09 DIAGNOSIS — N186 End stage renal disease: Secondary | ICD-10-CM | POA: Diagnosis not present

## 2023-01-09 DIAGNOSIS — Z992 Dependence on renal dialysis: Secondary | ICD-10-CM | POA: Diagnosis not present

## 2023-01-09 DIAGNOSIS — N2581 Secondary hyperparathyroidism of renal origin: Secondary | ICD-10-CM | POA: Diagnosis not present

## 2023-01-12 DIAGNOSIS — N186 End stage renal disease: Secondary | ICD-10-CM | POA: Diagnosis not present

## 2023-01-12 DIAGNOSIS — N2581 Secondary hyperparathyroidism of renal origin: Secondary | ICD-10-CM | POA: Diagnosis not present

## 2023-01-12 DIAGNOSIS — Z992 Dependence on renal dialysis: Secondary | ICD-10-CM | POA: Diagnosis not present

## 2023-01-16 DIAGNOSIS — N2581 Secondary hyperparathyroidism of renal origin: Secondary | ICD-10-CM | POA: Diagnosis not present

## 2023-01-16 DIAGNOSIS — N186 End stage renal disease: Secondary | ICD-10-CM | POA: Diagnosis not present

## 2023-01-16 DIAGNOSIS — Z992 Dependence on renal dialysis: Secondary | ICD-10-CM | POA: Diagnosis not present

## 2023-01-19 DIAGNOSIS — N2581 Secondary hyperparathyroidism of renal origin: Secondary | ICD-10-CM | POA: Diagnosis not present

## 2023-01-19 DIAGNOSIS — Z992 Dependence on renal dialysis: Secondary | ICD-10-CM | POA: Diagnosis not present

## 2023-01-19 DIAGNOSIS — N186 End stage renal disease: Secondary | ICD-10-CM | POA: Diagnosis not present

## 2023-01-21 DIAGNOSIS — N2581 Secondary hyperparathyroidism of renal origin: Secondary | ICD-10-CM | POA: Diagnosis not present

## 2023-01-21 DIAGNOSIS — Z992 Dependence on renal dialysis: Secondary | ICD-10-CM | POA: Diagnosis not present

## 2023-01-21 DIAGNOSIS — N186 End stage renal disease: Secondary | ICD-10-CM | POA: Diagnosis not present

## 2023-01-23 DIAGNOSIS — N2581 Secondary hyperparathyroidism of renal origin: Secondary | ICD-10-CM | POA: Diagnosis not present

## 2023-01-23 DIAGNOSIS — Z992 Dependence on renal dialysis: Secondary | ICD-10-CM | POA: Diagnosis not present

## 2023-01-23 DIAGNOSIS — N186 End stage renal disease: Secondary | ICD-10-CM | POA: Diagnosis not present

## 2023-01-25 DIAGNOSIS — Z992 Dependence on renal dialysis: Secondary | ICD-10-CM | POA: Diagnosis not present

## 2023-01-25 DIAGNOSIS — M3214 Glomerular disease in systemic lupus erythematosus: Secondary | ICD-10-CM | POA: Diagnosis not present

## 2023-01-25 DIAGNOSIS — N186 End stage renal disease: Secondary | ICD-10-CM | POA: Diagnosis not present

## 2023-01-26 DIAGNOSIS — N2581 Secondary hyperparathyroidism of renal origin: Secondary | ICD-10-CM | POA: Diagnosis not present

## 2023-01-26 DIAGNOSIS — N186 End stage renal disease: Secondary | ICD-10-CM | POA: Diagnosis not present

## 2023-01-26 DIAGNOSIS — Z992 Dependence on renal dialysis: Secondary | ICD-10-CM | POA: Diagnosis not present

## 2023-01-30 DIAGNOSIS — Z992 Dependence on renal dialysis: Secondary | ICD-10-CM | POA: Diagnosis not present

## 2023-01-30 DIAGNOSIS — N186 End stage renal disease: Secondary | ICD-10-CM | POA: Diagnosis not present

## 2023-01-30 DIAGNOSIS — N2581 Secondary hyperparathyroidism of renal origin: Secondary | ICD-10-CM | POA: Diagnosis not present

## 2023-02-02 DIAGNOSIS — Z992 Dependence on renal dialysis: Secondary | ICD-10-CM | POA: Diagnosis not present

## 2023-02-02 DIAGNOSIS — N2581 Secondary hyperparathyroidism of renal origin: Secondary | ICD-10-CM | POA: Diagnosis not present

## 2023-02-02 DIAGNOSIS — N186 End stage renal disease: Secondary | ICD-10-CM | POA: Diagnosis not present

## 2023-02-04 DIAGNOSIS — N2581 Secondary hyperparathyroidism of renal origin: Secondary | ICD-10-CM | POA: Diagnosis not present

## 2023-02-04 DIAGNOSIS — N186 End stage renal disease: Secondary | ICD-10-CM | POA: Diagnosis not present

## 2023-02-04 DIAGNOSIS — Z992 Dependence on renal dialysis: Secondary | ICD-10-CM | POA: Diagnosis not present

## 2023-02-06 DIAGNOSIS — N2581 Secondary hyperparathyroidism of renal origin: Secondary | ICD-10-CM | POA: Diagnosis not present

## 2023-02-06 DIAGNOSIS — Z992 Dependence on renal dialysis: Secondary | ICD-10-CM | POA: Diagnosis not present

## 2023-02-06 DIAGNOSIS — N186 End stage renal disease: Secondary | ICD-10-CM | POA: Diagnosis not present

## 2023-02-09 DIAGNOSIS — N186 End stage renal disease: Secondary | ICD-10-CM | POA: Diagnosis not present

## 2023-02-09 DIAGNOSIS — Z992 Dependence on renal dialysis: Secondary | ICD-10-CM | POA: Diagnosis not present

## 2023-02-09 DIAGNOSIS — N2581 Secondary hyperparathyroidism of renal origin: Secondary | ICD-10-CM | POA: Diagnosis not present

## 2023-02-13 DIAGNOSIS — N186 End stage renal disease: Secondary | ICD-10-CM | POA: Diagnosis not present

## 2023-02-13 DIAGNOSIS — Z992 Dependence on renal dialysis: Secondary | ICD-10-CM | POA: Diagnosis not present

## 2023-02-13 DIAGNOSIS — N2581 Secondary hyperparathyroidism of renal origin: Secondary | ICD-10-CM | POA: Diagnosis not present

## 2023-02-16 DIAGNOSIS — N2581 Secondary hyperparathyroidism of renal origin: Secondary | ICD-10-CM | POA: Diagnosis not present

## 2023-02-16 DIAGNOSIS — Z992 Dependence on renal dialysis: Secondary | ICD-10-CM | POA: Diagnosis not present

## 2023-02-16 DIAGNOSIS — N186 End stage renal disease: Secondary | ICD-10-CM | POA: Diagnosis not present

## 2023-02-18 DIAGNOSIS — N186 End stage renal disease: Secondary | ICD-10-CM | POA: Diagnosis not present

## 2023-02-18 DIAGNOSIS — Z992 Dependence on renal dialysis: Secondary | ICD-10-CM | POA: Diagnosis not present

## 2023-02-18 DIAGNOSIS — N2581 Secondary hyperparathyroidism of renal origin: Secondary | ICD-10-CM | POA: Diagnosis not present

## 2023-02-20 DIAGNOSIS — N2581 Secondary hyperparathyroidism of renal origin: Secondary | ICD-10-CM | POA: Diagnosis not present

## 2023-02-20 DIAGNOSIS — Z992 Dependence on renal dialysis: Secondary | ICD-10-CM | POA: Diagnosis not present

## 2023-02-20 DIAGNOSIS — N186 End stage renal disease: Secondary | ICD-10-CM | POA: Diagnosis not present

## 2023-02-23 DIAGNOSIS — N186 End stage renal disease: Secondary | ICD-10-CM | POA: Diagnosis not present

## 2023-02-23 DIAGNOSIS — N2581 Secondary hyperparathyroidism of renal origin: Secondary | ICD-10-CM | POA: Diagnosis not present

## 2023-02-23 DIAGNOSIS — Z992 Dependence on renal dialysis: Secondary | ICD-10-CM | POA: Diagnosis not present

## 2023-02-25 DIAGNOSIS — N186 End stage renal disease: Secondary | ICD-10-CM | POA: Diagnosis not present

## 2023-02-25 DIAGNOSIS — M3214 Glomerular disease in systemic lupus erythematosus: Secondary | ICD-10-CM | POA: Diagnosis not present

## 2023-02-25 DIAGNOSIS — N2581 Secondary hyperparathyroidism of renal origin: Secondary | ICD-10-CM | POA: Diagnosis not present

## 2023-02-25 DIAGNOSIS — Z992 Dependence on renal dialysis: Secondary | ICD-10-CM | POA: Diagnosis not present

## 2023-02-27 DIAGNOSIS — N2581 Secondary hyperparathyroidism of renal origin: Secondary | ICD-10-CM | POA: Diagnosis not present

## 2023-02-27 DIAGNOSIS — Z992 Dependence on renal dialysis: Secondary | ICD-10-CM | POA: Diagnosis not present

## 2023-02-27 DIAGNOSIS — N186 End stage renal disease: Secondary | ICD-10-CM | POA: Diagnosis not present

## 2023-03-02 DIAGNOSIS — Z992 Dependence on renal dialysis: Secondary | ICD-10-CM | POA: Diagnosis not present

## 2023-03-02 DIAGNOSIS — N186 End stage renal disease: Secondary | ICD-10-CM | POA: Diagnosis not present

## 2023-03-02 DIAGNOSIS — N2581 Secondary hyperparathyroidism of renal origin: Secondary | ICD-10-CM | POA: Diagnosis not present

## 2023-03-04 DIAGNOSIS — N2581 Secondary hyperparathyroidism of renal origin: Secondary | ICD-10-CM | POA: Diagnosis not present

## 2023-03-04 DIAGNOSIS — N186 End stage renal disease: Secondary | ICD-10-CM | POA: Diagnosis not present

## 2023-03-04 DIAGNOSIS — Z992 Dependence on renal dialysis: Secondary | ICD-10-CM | POA: Diagnosis not present

## 2023-03-06 DIAGNOSIS — N2581 Secondary hyperparathyroidism of renal origin: Secondary | ICD-10-CM | POA: Diagnosis not present

## 2023-03-06 DIAGNOSIS — N186 End stage renal disease: Secondary | ICD-10-CM | POA: Diagnosis not present

## 2023-03-06 DIAGNOSIS — Z992 Dependence on renal dialysis: Secondary | ICD-10-CM | POA: Diagnosis not present

## 2023-03-09 DIAGNOSIS — N186 End stage renal disease: Secondary | ICD-10-CM | POA: Diagnosis not present

## 2023-03-09 DIAGNOSIS — R110 Nausea: Secondary | ICD-10-CM | POA: Diagnosis not present

## 2023-03-09 DIAGNOSIS — Z992 Dependence on renal dialysis: Secondary | ICD-10-CM | POA: Diagnosis not present

## 2023-03-09 DIAGNOSIS — N2581 Secondary hyperparathyroidism of renal origin: Secondary | ICD-10-CM | POA: Diagnosis not present

## 2023-03-11 DIAGNOSIS — N2581 Secondary hyperparathyroidism of renal origin: Secondary | ICD-10-CM | POA: Diagnosis not present

## 2023-03-11 DIAGNOSIS — Z992 Dependence on renal dialysis: Secondary | ICD-10-CM | POA: Diagnosis not present

## 2023-03-11 DIAGNOSIS — R110 Nausea: Secondary | ICD-10-CM | POA: Diagnosis not present

## 2023-03-11 DIAGNOSIS — N186 End stage renal disease: Secondary | ICD-10-CM | POA: Diagnosis not present

## 2023-03-13 DIAGNOSIS — N2581 Secondary hyperparathyroidism of renal origin: Secondary | ICD-10-CM | POA: Diagnosis not present

## 2023-03-13 DIAGNOSIS — N186 End stage renal disease: Secondary | ICD-10-CM | POA: Diagnosis not present

## 2023-03-13 DIAGNOSIS — Z992 Dependence on renal dialysis: Secondary | ICD-10-CM | POA: Diagnosis not present

## 2023-03-13 DIAGNOSIS — R110 Nausea: Secondary | ICD-10-CM | POA: Diagnosis not present

## 2023-03-16 DIAGNOSIS — N186 End stage renal disease: Secondary | ICD-10-CM | POA: Diagnosis not present

## 2023-03-16 DIAGNOSIS — N2581 Secondary hyperparathyroidism of renal origin: Secondary | ICD-10-CM | POA: Diagnosis not present

## 2023-03-16 DIAGNOSIS — Z992 Dependence on renal dialysis: Secondary | ICD-10-CM | POA: Diagnosis not present

## 2023-03-18 DIAGNOSIS — Z992 Dependence on renal dialysis: Secondary | ICD-10-CM | POA: Diagnosis not present

## 2023-03-18 DIAGNOSIS — N2581 Secondary hyperparathyroidism of renal origin: Secondary | ICD-10-CM | POA: Diagnosis not present

## 2023-03-18 DIAGNOSIS — N186 End stage renal disease: Secondary | ICD-10-CM | POA: Diagnosis not present

## 2023-03-20 DIAGNOSIS — N186 End stage renal disease: Secondary | ICD-10-CM | POA: Diagnosis not present

## 2023-03-20 DIAGNOSIS — Z992 Dependence on renal dialysis: Secondary | ICD-10-CM | POA: Diagnosis not present

## 2023-03-20 DIAGNOSIS — N2581 Secondary hyperparathyroidism of renal origin: Secondary | ICD-10-CM | POA: Diagnosis not present

## 2023-03-22 DIAGNOSIS — Z992 Dependence on renal dialysis: Secondary | ICD-10-CM | POA: Diagnosis not present

## 2023-03-22 DIAGNOSIS — N186 End stage renal disease: Secondary | ICD-10-CM | POA: Diagnosis not present

## 2023-03-22 DIAGNOSIS — N2581 Secondary hyperparathyroidism of renal origin: Secondary | ICD-10-CM | POA: Diagnosis not present

## 2023-03-24 DIAGNOSIS — N186 End stage renal disease: Secondary | ICD-10-CM | POA: Diagnosis not present

## 2023-03-24 DIAGNOSIS — N2581 Secondary hyperparathyroidism of renal origin: Secondary | ICD-10-CM | POA: Diagnosis not present

## 2023-03-24 DIAGNOSIS — Z992 Dependence on renal dialysis: Secondary | ICD-10-CM | POA: Diagnosis not present

## 2023-03-27 DIAGNOSIS — M3214 Glomerular disease in systemic lupus erythematosus: Secondary | ICD-10-CM | POA: Diagnosis not present

## 2023-03-27 DIAGNOSIS — N186 End stage renal disease: Secondary | ICD-10-CM | POA: Diagnosis not present

## 2023-03-27 DIAGNOSIS — N2581 Secondary hyperparathyroidism of renal origin: Secondary | ICD-10-CM | POA: Diagnosis not present

## 2023-03-27 DIAGNOSIS — Z992 Dependence on renal dialysis: Secondary | ICD-10-CM | POA: Diagnosis not present

## 2023-03-30 DIAGNOSIS — Z992 Dependence on renal dialysis: Secondary | ICD-10-CM | POA: Diagnosis not present

## 2023-03-30 DIAGNOSIS — N2581 Secondary hyperparathyroidism of renal origin: Secondary | ICD-10-CM | POA: Diagnosis not present

## 2023-03-30 DIAGNOSIS — N186 End stage renal disease: Secondary | ICD-10-CM | POA: Diagnosis not present

## 2023-04-01 DIAGNOSIS — Z992 Dependence on renal dialysis: Secondary | ICD-10-CM | POA: Diagnosis not present

## 2023-04-01 DIAGNOSIS — N2581 Secondary hyperparathyroidism of renal origin: Secondary | ICD-10-CM | POA: Diagnosis not present

## 2023-04-01 DIAGNOSIS — N186 End stage renal disease: Secondary | ICD-10-CM | POA: Diagnosis not present

## 2023-04-03 DIAGNOSIS — N2581 Secondary hyperparathyroidism of renal origin: Secondary | ICD-10-CM | POA: Diagnosis not present

## 2023-04-03 DIAGNOSIS — Z992 Dependence on renal dialysis: Secondary | ICD-10-CM | POA: Diagnosis not present

## 2023-04-03 DIAGNOSIS — N186 End stage renal disease: Secondary | ICD-10-CM | POA: Diagnosis not present

## 2023-04-06 DIAGNOSIS — N186 End stage renal disease: Secondary | ICD-10-CM | POA: Diagnosis not present

## 2023-04-06 DIAGNOSIS — Z992 Dependence on renal dialysis: Secondary | ICD-10-CM | POA: Diagnosis not present

## 2023-04-06 DIAGNOSIS — N2581 Secondary hyperparathyroidism of renal origin: Secondary | ICD-10-CM | POA: Diagnosis not present

## 2023-04-08 DIAGNOSIS — Z992 Dependence on renal dialysis: Secondary | ICD-10-CM | POA: Diagnosis not present

## 2023-04-08 DIAGNOSIS — N2581 Secondary hyperparathyroidism of renal origin: Secondary | ICD-10-CM | POA: Diagnosis not present

## 2023-04-08 DIAGNOSIS — N186 End stage renal disease: Secondary | ICD-10-CM | POA: Diagnosis not present

## 2023-04-10 DIAGNOSIS — N186 End stage renal disease: Secondary | ICD-10-CM | POA: Diagnosis not present

## 2023-04-10 DIAGNOSIS — Z992 Dependence on renal dialysis: Secondary | ICD-10-CM | POA: Diagnosis not present

## 2023-04-10 DIAGNOSIS — N2581 Secondary hyperparathyroidism of renal origin: Secondary | ICD-10-CM | POA: Diagnosis not present

## 2023-04-13 DIAGNOSIS — N186 End stage renal disease: Secondary | ICD-10-CM | POA: Diagnosis not present

## 2023-04-13 DIAGNOSIS — Z992 Dependence on renal dialysis: Secondary | ICD-10-CM | POA: Diagnosis not present

## 2023-04-13 DIAGNOSIS — N2581 Secondary hyperparathyroidism of renal origin: Secondary | ICD-10-CM | POA: Diagnosis not present

## 2023-04-15 DIAGNOSIS — N186 End stage renal disease: Secondary | ICD-10-CM | POA: Diagnosis not present

## 2023-04-15 DIAGNOSIS — Z992 Dependence on renal dialysis: Secondary | ICD-10-CM | POA: Diagnosis not present

## 2023-04-15 DIAGNOSIS — N2581 Secondary hyperparathyroidism of renal origin: Secondary | ICD-10-CM | POA: Diagnosis not present

## 2023-04-17 DIAGNOSIS — N2581 Secondary hyperparathyroidism of renal origin: Secondary | ICD-10-CM | POA: Diagnosis not present

## 2023-04-17 DIAGNOSIS — Z992 Dependence on renal dialysis: Secondary | ICD-10-CM | POA: Diagnosis not present

## 2023-04-17 DIAGNOSIS — N186 End stage renal disease: Secondary | ICD-10-CM | POA: Diagnosis not present

## 2023-04-19 DIAGNOSIS — R110 Nausea: Secondary | ICD-10-CM | POA: Diagnosis not present

## 2023-04-19 DIAGNOSIS — N2581 Secondary hyperparathyroidism of renal origin: Secondary | ICD-10-CM | POA: Diagnosis not present

## 2023-04-19 DIAGNOSIS — N186 End stage renal disease: Secondary | ICD-10-CM | POA: Diagnosis not present

## 2023-04-19 DIAGNOSIS — Z992 Dependence on renal dialysis: Secondary | ICD-10-CM | POA: Diagnosis not present

## 2023-04-22 DIAGNOSIS — N186 End stage renal disease: Secondary | ICD-10-CM | POA: Diagnosis not present

## 2023-04-22 DIAGNOSIS — Z992 Dependence on renal dialysis: Secondary | ICD-10-CM | POA: Diagnosis not present

## 2023-04-22 DIAGNOSIS — R110 Nausea: Secondary | ICD-10-CM | POA: Diagnosis not present

## 2023-04-22 DIAGNOSIS — N2581 Secondary hyperparathyroidism of renal origin: Secondary | ICD-10-CM | POA: Diagnosis not present

## 2023-04-24 DIAGNOSIS — N186 End stage renal disease: Secondary | ICD-10-CM | POA: Diagnosis not present

## 2023-04-24 DIAGNOSIS — Z992 Dependence on renal dialysis: Secondary | ICD-10-CM | POA: Diagnosis not present

## 2023-04-24 DIAGNOSIS — N2581 Secondary hyperparathyroidism of renal origin: Secondary | ICD-10-CM | POA: Diagnosis not present

## 2023-04-24 DIAGNOSIS — R110 Nausea: Secondary | ICD-10-CM | POA: Diagnosis not present

## 2023-04-26 DIAGNOSIS — N186 End stage renal disease: Secondary | ICD-10-CM | POA: Diagnosis not present

## 2023-04-26 DIAGNOSIS — N2581 Secondary hyperparathyroidism of renal origin: Secondary | ICD-10-CM | POA: Diagnosis not present

## 2023-04-26 DIAGNOSIS — Z992 Dependence on renal dialysis: Secondary | ICD-10-CM | POA: Diagnosis not present

## 2023-04-27 DIAGNOSIS — N186 End stage renal disease: Secondary | ICD-10-CM | POA: Diagnosis not present

## 2023-04-27 DIAGNOSIS — M3214 Glomerular disease in systemic lupus erythematosus: Secondary | ICD-10-CM | POA: Diagnosis not present

## 2023-04-27 DIAGNOSIS — Z992 Dependence on renal dialysis: Secondary | ICD-10-CM | POA: Diagnosis not present

## 2023-04-29 DIAGNOSIS — N2581 Secondary hyperparathyroidism of renal origin: Secondary | ICD-10-CM | POA: Diagnosis not present

## 2023-04-29 DIAGNOSIS — Z992 Dependence on renal dialysis: Secondary | ICD-10-CM | POA: Diagnosis not present

## 2023-04-29 DIAGNOSIS — N186 End stage renal disease: Secondary | ICD-10-CM | POA: Diagnosis not present

## 2023-05-01 DIAGNOSIS — N186 End stage renal disease: Secondary | ICD-10-CM | POA: Diagnosis not present

## 2023-05-01 DIAGNOSIS — Z992 Dependence on renal dialysis: Secondary | ICD-10-CM | POA: Diagnosis not present

## 2023-05-01 DIAGNOSIS — N2581 Secondary hyperparathyroidism of renal origin: Secondary | ICD-10-CM | POA: Diagnosis not present

## 2023-05-04 DIAGNOSIS — N186 End stage renal disease: Secondary | ICD-10-CM | POA: Diagnosis not present

## 2023-05-04 DIAGNOSIS — Z992 Dependence on renal dialysis: Secondary | ICD-10-CM | POA: Diagnosis not present

## 2023-05-04 DIAGNOSIS — N2581 Secondary hyperparathyroidism of renal origin: Secondary | ICD-10-CM | POA: Diagnosis not present

## 2023-05-06 DIAGNOSIS — N186 End stage renal disease: Secondary | ICD-10-CM | POA: Diagnosis not present

## 2023-05-06 DIAGNOSIS — N2581 Secondary hyperparathyroidism of renal origin: Secondary | ICD-10-CM | POA: Diagnosis not present

## 2023-05-06 DIAGNOSIS — Z992 Dependence on renal dialysis: Secondary | ICD-10-CM | POA: Diagnosis not present

## 2023-05-08 DIAGNOSIS — N2581 Secondary hyperparathyroidism of renal origin: Secondary | ICD-10-CM | POA: Diagnosis not present

## 2023-05-08 DIAGNOSIS — Z992 Dependence on renal dialysis: Secondary | ICD-10-CM | POA: Diagnosis not present

## 2023-05-08 DIAGNOSIS — N186 End stage renal disease: Secondary | ICD-10-CM | POA: Diagnosis not present

## 2023-05-11 DIAGNOSIS — N186 End stage renal disease: Secondary | ICD-10-CM | POA: Diagnosis not present

## 2023-05-11 DIAGNOSIS — N2581 Secondary hyperparathyroidism of renal origin: Secondary | ICD-10-CM | POA: Diagnosis not present

## 2023-05-11 DIAGNOSIS — Z992 Dependence on renal dialysis: Secondary | ICD-10-CM | POA: Diagnosis not present

## 2023-05-15 DIAGNOSIS — N186 End stage renal disease: Secondary | ICD-10-CM | POA: Diagnosis not present

## 2023-05-15 DIAGNOSIS — Z992 Dependence on renal dialysis: Secondary | ICD-10-CM | POA: Diagnosis not present

## 2023-05-15 DIAGNOSIS — N2581 Secondary hyperparathyroidism of renal origin: Secondary | ICD-10-CM | POA: Diagnosis not present

## 2023-05-18 DIAGNOSIS — N186 End stage renal disease: Secondary | ICD-10-CM | POA: Diagnosis not present

## 2023-05-18 DIAGNOSIS — N2581 Secondary hyperparathyroidism of renal origin: Secondary | ICD-10-CM | POA: Diagnosis not present

## 2023-05-18 DIAGNOSIS — Z992 Dependence on renal dialysis: Secondary | ICD-10-CM | POA: Diagnosis not present

## 2023-05-20 DIAGNOSIS — N2581 Secondary hyperparathyroidism of renal origin: Secondary | ICD-10-CM | POA: Diagnosis not present

## 2023-05-20 DIAGNOSIS — Z992 Dependence on renal dialysis: Secondary | ICD-10-CM | POA: Diagnosis not present

## 2023-05-20 DIAGNOSIS — N186 End stage renal disease: Secondary | ICD-10-CM | POA: Diagnosis not present

## 2023-05-22 DIAGNOSIS — Z992 Dependence on renal dialysis: Secondary | ICD-10-CM | POA: Diagnosis not present

## 2023-05-22 DIAGNOSIS — N2581 Secondary hyperparathyroidism of renal origin: Secondary | ICD-10-CM | POA: Diagnosis not present

## 2023-05-22 DIAGNOSIS — N186 End stage renal disease: Secondary | ICD-10-CM | POA: Diagnosis not present

## 2023-05-27 DIAGNOSIS — N186 End stage renal disease: Secondary | ICD-10-CM | POA: Diagnosis not present

## 2023-05-27 DIAGNOSIS — N2581 Secondary hyperparathyroidism of renal origin: Secondary | ICD-10-CM | POA: Diagnosis not present

## 2023-05-27 DIAGNOSIS — Z992 Dependence on renal dialysis: Secondary | ICD-10-CM | POA: Diagnosis not present

## 2023-05-28 DIAGNOSIS — M3214 Glomerular disease in systemic lupus erythematosus: Secondary | ICD-10-CM | POA: Diagnosis not present

## 2023-05-28 DIAGNOSIS — Z992 Dependence on renal dialysis: Secondary | ICD-10-CM | POA: Diagnosis not present

## 2023-05-28 DIAGNOSIS — N186 End stage renal disease: Secondary | ICD-10-CM | POA: Diagnosis not present

## 2023-05-29 DIAGNOSIS — N2581 Secondary hyperparathyroidism of renal origin: Secondary | ICD-10-CM | POA: Diagnosis not present

## 2023-05-29 DIAGNOSIS — N186 End stage renal disease: Secondary | ICD-10-CM | POA: Diagnosis not present

## 2023-05-29 DIAGNOSIS — Z992 Dependence on renal dialysis: Secondary | ICD-10-CM | POA: Diagnosis not present

## 2023-06-01 DIAGNOSIS — N186 End stage renal disease: Secondary | ICD-10-CM | POA: Diagnosis not present

## 2023-06-01 DIAGNOSIS — Z992 Dependence on renal dialysis: Secondary | ICD-10-CM | POA: Diagnosis not present

## 2023-06-01 DIAGNOSIS — N2581 Secondary hyperparathyroidism of renal origin: Secondary | ICD-10-CM | POA: Diagnosis not present

## 2023-06-03 DIAGNOSIS — N2581 Secondary hyperparathyroidism of renal origin: Secondary | ICD-10-CM | POA: Diagnosis not present

## 2023-06-03 DIAGNOSIS — N186 End stage renal disease: Secondary | ICD-10-CM | POA: Diagnosis not present

## 2023-06-03 DIAGNOSIS — Z992 Dependence on renal dialysis: Secondary | ICD-10-CM | POA: Diagnosis not present

## 2023-06-05 DIAGNOSIS — Z992 Dependence on renal dialysis: Secondary | ICD-10-CM | POA: Diagnosis not present

## 2023-06-05 DIAGNOSIS — N186 End stage renal disease: Secondary | ICD-10-CM | POA: Diagnosis not present

## 2023-06-05 DIAGNOSIS — N2581 Secondary hyperparathyroidism of renal origin: Secondary | ICD-10-CM | POA: Diagnosis not present

## 2023-06-08 DIAGNOSIS — N2581 Secondary hyperparathyroidism of renal origin: Secondary | ICD-10-CM | POA: Diagnosis not present

## 2023-06-08 DIAGNOSIS — Z992 Dependence on renal dialysis: Secondary | ICD-10-CM | POA: Diagnosis not present

## 2023-06-08 DIAGNOSIS — N186 End stage renal disease: Secondary | ICD-10-CM | POA: Diagnosis not present

## 2023-06-10 DIAGNOSIS — Z992 Dependence on renal dialysis: Secondary | ICD-10-CM | POA: Diagnosis not present

## 2023-06-10 DIAGNOSIS — N2581 Secondary hyperparathyroidism of renal origin: Secondary | ICD-10-CM | POA: Diagnosis not present

## 2023-06-10 DIAGNOSIS — N186 End stage renal disease: Secondary | ICD-10-CM | POA: Diagnosis not present

## 2023-06-12 DIAGNOSIS — N2581 Secondary hyperparathyroidism of renal origin: Secondary | ICD-10-CM | POA: Diagnosis not present

## 2023-06-12 DIAGNOSIS — N186 End stage renal disease: Secondary | ICD-10-CM | POA: Diagnosis not present

## 2023-06-12 DIAGNOSIS — Z992 Dependence on renal dialysis: Secondary | ICD-10-CM | POA: Diagnosis not present

## 2023-06-15 DIAGNOSIS — R110 Nausea: Secondary | ICD-10-CM | POA: Diagnosis not present

## 2023-06-15 DIAGNOSIS — N2581 Secondary hyperparathyroidism of renal origin: Secondary | ICD-10-CM | POA: Diagnosis not present

## 2023-06-15 DIAGNOSIS — N186 End stage renal disease: Secondary | ICD-10-CM | POA: Diagnosis not present

## 2023-06-15 DIAGNOSIS — Z992 Dependence on renal dialysis: Secondary | ICD-10-CM | POA: Diagnosis not present

## 2023-06-17 DIAGNOSIS — Z992 Dependence on renal dialysis: Secondary | ICD-10-CM | POA: Diagnosis not present

## 2023-06-17 DIAGNOSIS — R110 Nausea: Secondary | ICD-10-CM | POA: Diagnosis not present

## 2023-06-17 DIAGNOSIS — N2581 Secondary hyperparathyroidism of renal origin: Secondary | ICD-10-CM | POA: Diagnosis not present

## 2023-06-17 DIAGNOSIS — N186 End stage renal disease: Secondary | ICD-10-CM | POA: Diagnosis not present

## 2023-06-19 DIAGNOSIS — N2581 Secondary hyperparathyroidism of renal origin: Secondary | ICD-10-CM | POA: Diagnosis not present

## 2023-06-19 DIAGNOSIS — R110 Nausea: Secondary | ICD-10-CM | POA: Diagnosis not present

## 2023-06-19 DIAGNOSIS — Z992 Dependence on renal dialysis: Secondary | ICD-10-CM | POA: Diagnosis not present

## 2023-06-19 DIAGNOSIS — N186 End stage renal disease: Secondary | ICD-10-CM | POA: Diagnosis not present

## 2023-06-22 DIAGNOSIS — N2581 Secondary hyperparathyroidism of renal origin: Secondary | ICD-10-CM | POA: Diagnosis not present

## 2023-06-22 DIAGNOSIS — Z992 Dependence on renal dialysis: Secondary | ICD-10-CM | POA: Diagnosis not present

## 2023-06-22 DIAGNOSIS — N186 End stage renal disease: Secondary | ICD-10-CM | POA: Diagnosis not present

## 2023-06-24 DIAGNOSIS — N2581 Secondary hyperparathyroidism of renal origin: Secondary | ICD-10-CM | POA: Diagnosis not present

## 2023-06-24 DIAGNOSIS — Z992 Dependence on renal dialysis: Secondary | ICD-10-CM | POA: Diagnosis not present

## 2023-06-24 DIAGNOSIS — N186 End stage renal disease: Secondary | ICD-10-CM | POA: Diagnosis not present

## 2023-06-25 DIAGNOSIS — M3214 Glomerular disease in systemic lupus erythematosus: Secondary | ICD-10-CM | POA: Diagnosis not present

## 2023-06-25 DIAGNOSIS — N186 End stage renal disease: Secondary | ICD-10-CM | POA: Diagnosis not present

## 2023-06-25 DIAGNOSIS — Z992 Dependence on renal dialysis: Secondary | ICD-10-CM | POA: Diagnosis not present

## 2023-06-26 DIAGNOSIS — N2581 Secondary hyperparathyroidism of renal origin: Secondary | ICD-10-CM | POA: Diagnosis not present

## 2023-06-26 DIAGNOSIS — N186 End stage renal disease: Secondary | ICD-10-CM | POA: Diagnosis not present

## 2023-06-26 DIAGNOSIS — Z992 Dependence on renal dialysis: Secondary | ICD-10-CM | POA: Diagnosis not present

## 2023-06-29 DIAGNOSIS — N186 End stage renal disease: Secondary | ICD-10-CM | POA: Diagnosis not present

## 2023-06-29 DIAGNOSIS — N2581 Secondary hyperparathyroidism of renal origin: Secondary | ICD-10-CM | POA: Diagnosis not present

## 2023-06-29 DIAGNOSIS — Z992 Dependence on renal dialysis: Secondary | ICD-10-CM | POA: Diagnosis not present

## 2023-07-01 DIAGNOSIS — N186 End stage renal disease: Secondary | ICD-10-CM | POA: Diagnosis not present

## 2023-07-01 DIAGNOSIS — N2581 Secondary hyperparathyroidism of renal origin: Secondary | ICD-10-CM | POA: Diagnosis not present

## 2023-07-01 DIAGNOSIS — Z992 Dependence on renal dialysis: Secondary | ICD-10-CM | POA: Diagnosis not present

## 2023-07-03 DIAGNOSIS — Z992 Dependence on renal dialysis: Secondary | ICD-10-CM | POA: Diagnosis not present

## 2023-07-03 DIAGNOSIS — N186 End stage renal disease: Secondary | ICD-10-CM | POA: Diagnosis not present

## 2023-07-03 DIAGNOSIS — N2581 Secondary hyperparathyroidism of renal origin: Secondary | ICD-10-CM | POA: Diagnosis not present

## 2023-07-06 DIAGNOSIS — N186 End stage renal disease: Secondary | ICD-10-CM | POA: Diagnosis not present

## 2023-07-06 DIAGNOSIS — Z992 Dependence on renal dialysis: Secondary | ICD-10-CM | POA: Diagnosis not present

## 2023-07-06 DIAGNOSIS — N2581 Secondary hyperparathyroidism of renal origin: Secondary | ICD-10-CM | POA: Diagnosis not present

## 2023-07-10 DIAGNOSIS — N2581 Secondary hyperparathyroidism of renal origin: Secondary | ICD-10-CM | POA: Diagnosis not present

## 2023-07-10 DIAGNOSIS — N186 End stage renal disease: Secondary | ICD-10-CM | POA: Diagnosis not present

## 2023-07-10 DIAGNOSIS — Z992 Dependence on renal dialysis: Secondary | ICD-10-CM | POA: Diagnosis not present

## 2023-07-13 DIAGNOSIS — N186 End stage renal disease: Secondary | ICD-10-CM | POA: Diagnosis not present

## 2023-07-13 DIAGNOSIS — Z992 Dependence on renal dialysis: Secondary | ICD-10-CM | POA: Diagnosis not present

## 2023-07-13 DIAGNOSIS — N2581 Secondary hyperparathyroidism of renal origin: Secondary | ICD-10-CM | POA: Diagnosis not present

## 2023-07-17 DIAGNOSIS — N186 End stage renal disease: Secondary | ICD-10-CM | POA: Diagnosis not present

## 2023-07-17 DIAGNOSIS — N2581 Secondary hyperparathyroidism of renal origin: Secondary | ICD-10-CM | POA: Diagnosis not present

## 2023-07-17 DIAGNOSIS — Z992 Dependence on renal dialysis: Secondary | ICD-10-CM | POA: Diagnosis not present

## 2023-07-20 DIAGNOSIS — N2581 Secondary hyperparathyroidism of renal origin: Secondary | ICD-10-CM | POA: Diagnosis not present

## 2023-07-20 DIAGNOSIS — Z992 Dependence on renal dialysis: Secondary | ICD-10-CM | POA: Diagnosis not present

## 2023-07-20 DIAGNOSIS — N186 End stage renal disease: Secondary | ICD-10-CM | POA: Diagnosis not present

## 2023-07-24 DIAGNOSIS — Z992 Dependence on renal dialysis: Secondary | ICD-10-CM | POA: Diagnosis not present

## 2023-07-24 DIAGNOSIS — N2581 Secondary hyperparathyroidism of renal origin: Secondary | ICD-10-CM | POA: Diagnosis not present

## 2023-07-24 DIAGNOSIS — N186 End stage renal disease: Secondary | ICD-10-CM | POA: Diagnosis not present

## 2023-07-26 DIAGNOSIS — N186 End stage renal disease: Secondary | ICD-10-CM | POA: Diagnosis not present

## 2023-07-26 DIAGNOSIS — Z992 Dependence on renal dialysis: Secondary | ICD-10-CM | POA: Diagnosis not present

## 2023-07-26 DIAGNOSIS — M3214 Glomerular disease in systemic lupus erythematosus: Secondary | ICD-10-CM | POA: Diagnosis not present

## 2023-07-27 DIAGNOSIS — Z992 Dependence on renal dialysis: Secondary | ICD-10-CM | POA: Diagnosis not present

## 2023-07-27 DIAGNOSIS — N186 End stage renal disease: Secondary | ICD-10-CM | POA: Diagnosis not present

## 2023-07-27 DIAGNOSIS — N2581 Secondary hyperparathyroidism of renal origin: Secondary | ICD-10-CM | POA: Diagnosis not present

## 2023-07-28 ENCOUNTER — Ambulatory Visit (HOSPITAL_COMMUNITY)
Admission: RE | Admit: 2023-07-28 | Discharge: 2023-07-28 | Discharge status: Home / Self Care | Source: Ambulatory Visit | Attending: Nephrology | Admitting: Nephrology

## 2023-07-28 DIAGNOSIS — N186 End stage renal disease: Secondary | ICD-10-CM | POA: Diagnosis not present

## 2023-07-28 DIAGNOSIS — R188 Other ascites: Secondary | ICD-10-CM | POA: Insufficient documentation

## 2023-07-28 HISTORY — PX: IR PARACENTESIS: IMG2679

## 2023-07-28 MED ORDER — LIDOCAINE HCL 1 % IJ SOLN
20.0000 mL | Freq: Once | INTRAMUSCULAR | Status: AC
  Administered 2023-07-28: 10 mL

## 2023-07-28 NOTE — Procedures (Signed)
 PROCEDURE SUMMARY:  Successful US guided paracentesis from left lateral abdomen.  Yielded 3.7 liters of clear, yellow fluid.  No immediate complications.  Pt tolerated well.   Specimen was sent for labs.  EBL < 5mL  Hoyt Koch PA-C 07/28/2023 2:37 PM

## 2023-07-29 DIAGNOSIS — N2581 Secondary hyperparathyroidism of renal origin: Secondary | ICD-10-CM | POA: Diagnosis not present

## 2023-07-29 DIAGNOSIS — Z992 Dependence on renal dialysis: Secondary | ICD-10-CM | POA: Diagnosis not present

## 2023-07-29 DIAGNOSIS — N186 End stage renal disease: Secondary | ICD-10-CM | POA: Diagnosis not present

## 2023-07-31 DIAGNOSIS — Z992 Dependence on renal dialysis: Secondary | ICD-10-CM | POA: Diagnosis not present

## 2023-07-31 DIAGNOSIS — N186 End stage renal disease: Secondary | ICD-10-CM | POA: Diagnosis not present

## 2023-07-31 DIAGNOSIS — N2581 Secondary hyperparathyroidism of renal origin: Secondary | ICD-10-CM | POA: Diagnosis not present

## 2023-08-03 DIAGNOSIS — N186 End stage renal disease: Secondary | ICD-10-CM | POA: Diagnosis not present

## 2023-08-03 DIAGNOSIS — Z992 Dependence on renal dialysis: Secondary | ICD-10-CM | POA: Diagnosis not present

## 2023-08-03 DIAGNOSIS — N2581 Secondary hyperparathyroidism of renal origin: Secondary | ICD-10-CM | POA: Diagnosis not present

## 2023-08-05 DIAGNOSIS — N2581 Secondary hyperparathyroidism of renal origin: Secondary | ICD-10-CM | POA: Diagnosis not present

## 2023-08-05 DIAGNOSIS — N186 End stage renal disease: Secondary | ICD-10-CM | POA: Diagnosis not present

## 2023-08-05 DIAGNOSIS — Z992 Dependence on renal dialysis: Secondary | ICD-10-CM | POA: Diagnosis not present

## 2023-08-07 DIAGNOSIS — N2581 Secondary hyperparathyroidism of renal origin: Secondary | ICD-10-CM | POA: Diagnosis not present

## 2023-08-07 DIAGNOSIS — N186 End stage renal disease: Secondary | ICD-10-CM | POA: Diagnosis not present

## 2023-08-07 DIAGNOSIS — Z992 Dependence on renal dialysis: Secondary | ICD-10-CM | POA: Diagnosis not present

## 2023-08-10 DIAGNOSIS — N186 End stage renal disease: Secondary | ICD-10-CM | POA: Diagnosis not present

## 2023-08-10 DIAGNOSIS — Z992 Dependence on renal dialysis: Secondary | ICD-10-CM | POA: Diagnosis not present

## 2023-08-10 DIAGNOSIS — N2581 Secondary hyperparathyroidism of renal origin: Secondary | ICD-10-CM | POA: Diagnosis not present

## 2023-08-14 DIAGNOSIS — Z992 Dependence on renal dialysis: Secondary | ICD-10-CM | POA: Diagnosis not present

## 2023-08-14 DIAGNOSIS — N186 End stage renal disease: Secondary | ICD-10-CM | POA: Diagnosis not present

## 2023-08-14 DIAGNOSIS — N2581 Secondary hyperparathyroidism of renal origin: Secondary | ICD-10-CM | POA: Diagnosis not present

## 2023-08-17 DIAGNOSIS — N186 End stage renal disease: Secondary | ICD-10-CM | POA: Diagnosis not present

## 2023-08-17 DIAGNOSIS — N2581 Secondary hyperparathyroidism of renal origin: Secondary | ICD-10-CM | POA: Diagnosis not present

## 2023-08-17 DIAGNOSIS — Z992 Dependence on renal dialysis: Secondary | ICD-10-CM | POA: Diagnosis not present

## 2023-08-19 DIAGNOSIS — Z992 Dependence on renal dialysis: Secondary | ICD-10-CM | POA: Diagnosis not present

## 2023-08-19 DIAGNOSIS — N2581 Secondary hyperparathyroidism of renal origin: Secondary | ICD-10-CM | POA: Diagnosis not present

## 2023-08-19 DIAGNOSIS — N186 End stage renal disease: Secondary | ICD-10-CM | POA: Diagnosis not present

## 2023-08-21 DIAGNOSIS — N2581 Secondary hyperparathyroidism of renal origin: Secondary | ICD-10-CM | POA: Diagnosis not present

## 2023-08-21 DIAGNOSIS — Z992 Dependence on renal dialysis: Secondary | ICD-10-CM | POA: Diagnosis not present

## 2023-08-21 DIAGNOSIS — N186 End stage renal disease: Secondary | ICD-10-CM | POA: Diagnosis not present

## 2023-08-25 DIAGNOSIS — N2581 Secondary hyperparathyroidism of renal origin: Secondary | ICD-10-CM | POA: Diagnosis not present

## 2023-08-25 DIAGNOSIS — M3214 Glomerular disease in systemic lupus erythematosus: Secondary | ICD-10-CM | POA: Diagnosis not present

## 2023-08-25 DIAGNOSIS — Z992 Dependence on renal dialysis: Secondary | ICD-10-CM | POA: Diagnosis not present

## 2023-08-25 DIAGNOSIS — N186 End stage renal disease: Secondary | ICD-10-CM | POA: Diagnosis not present

## 2023-08-28 DIAGNOSIS — N186 End stage renal disease: Secondary | ICD-10-CM | POA: Diagnosis not present

## 2023-08-28 DIAGNOSIS — Z992 Dependence on renal dialysis: Secondary | ICD-10-CM | POA: Diagnosis not present

## 2023-08-28 DIAGNOSIS — N2581 Secondary hyperparathyroidism of renal origin: Secondary | ICD-10-CM | POA: Diagnosis not present

## 2023-08-31 DIAGNOSIS — R110 Nausea: Secondary | ICD-10-CM | POA: Diagnosis not present

## 2023-08-31 DIAGNOSIS — N186 End stage renal disease: Secondary | ICD-10-CM | POA: Diagnosis not present

## 2023-08-31 DIAGNOSIS — Z992 Dependence on renal dialysis: Secondary | ICD-10-CM | POA: Diagnosis not present

## 2023-08-31 DIAGNOSIS — N2581 Secondary hyperparathyroidism of renal origin: Secondary | ICD-10-CM | POA: Diagnosis not present

## 2023-09-02 ENCOUNTER — Encounter: Payer: Self-pay | Admitting: Nephrology

## 2023-09-02 ENCOUNTER — Other Ambulatory Visit (HOSPITAL_COMMUNITY): Payer: Self-pay | Admitting: Nephrology

## 2023-09-02 DIAGNOSIS — R188 Other ascites: Secondary | ICD-10-CM

## 2023-09-03 ENCOUNTER — Ambulatory Visit (HOSPITAL_COMMUNITY)
Admission: RE | Admit: 2023-09-03 | Discharge: 2023-09-03 | Disposition: A | Discharge status: Home / Self Care | Source: Ambulatory Visit | Attending: Nephrology | Admitting: Nephrology

## 2023-09-03 DIAGNOSIS — N186 End stage renal disease: Secondary | ICD-10-CM | POA: Insufficient documentation

## 2023-09-03 DIAGNOSIS — R188 Other ascites: Secondary | ICD-10-CM | POA: Insufficient documentation

## 2023-09-03 HISTORY — PX: IR PARACENTESIS: IMG2679

## 2023-09-03 MED ORDER — LIDOCAINE HCL (PF) 1 % IJ SOLN: 10.0000 mL | Freq: Once | INTRAMUSCULAR | Status: DC

## 2023-09-03 MED ORDER — LIDOCAINE HCL 1 % IJ SOLN
INTRAMUSCULAR | Status: AC
  Filled 2023-09-03: qty 20

## 2023-09-03 NOTE — Procedures (Signed)
 PROCEDURE SUMMARY:  Successful image-guided paracentesis from the Left abdomen.  Yielded 2.1 liters of clear, straw-colored fluid.  No immediate complications.  EBL: zero Patient tolerated well.    Please see imaging section of Epic for full dictation.  Gordy Lauber Zubayr Bednarczyk PA-C 09/03/2023 10:29 AM

## 2023-09-04 DIAGNOSIS — N2581 Secondary hyperparathyroidism of renal origin: Secondary | ICD-10-CM | POA: Diagnosis not present

## 2023-09-04 DIAGNOSIS — R110 Nausea: Secondary | ICD-10-CM | POA: Diagnosis not present

## 2023-09-04 DIAGNOSIS — Z992 Dependence on renal dialysis: Secondary | ICD-10-CM | POA: Diagnosis not present

## 2023-09-04 DIAGNOSIS — N186 End stage renal disease: Secondary | ICD-10-CM | POA: Diagnosis not present

## 2023-09-07 DIAGNOSIS — Z992 Dependence on renal dialysis: Secondary | ICD-10-CM | POA: Diagnosis not present

## 2023-09-07 DIAGNOSIS — N186 End stage renal disease: Secondary | ICD-10-CM | POA: Diagnosis not present

## 2023-09-07 DIAGNOSIS — N2581 Secondary hyperparathyroidism of renal origin: Secondary | ICD-10-CM | POA: Diagnosis not present

## 2023-09-09 DIAGNOSIS — Z992 Dependence on renal dialysis: Secondary | ICD-10-CM | POA: Diagnosis not present

## 2023-09-09 DIAGNOSIS — N186 End stage renal disease: Secondary | ICD-10-CM | POA: Diagnosis not present

## 2023-09-09 DIAGNOSIS — N2581 Secondary hyperparathyroidism of renal origin: Secondary | ICD-10-CM | POA: Diagnosis not present

## 2023-09-11 DIAGNOSIS — N186 End stage renal disease: Secondary | ICD-10-CM | POA: Diagnosis not present

## 2023-09-11 DIAGNOSIS — N2581 Secondary hyperparathyroidism of renal origin: Secondary | ICD-10-CM | POA: Diagnosis not present

## 2023-09-11 DIAGNOSIS — Z992 Dependence on renal dialysis: Secondary | ICD-10-CM | POA: Diagnosis not present

## 2023-09-14 DIAGNOSIS — Z992 Dependence on renal dialysis: Secondary | ICD-10-CM | POA: Diagnosis not present

## 2023-09-14 DIAGNOSIS — N186 End stage renal disease: Secondary | ICD-10-CM | POA: Diagnosis not present

## 2023-09-14 DIAGNOSIS — R110 Nausea: Secondary | ICD-10-CM | POA: Diagnosis not present

## 2023-09-14 DIAGNOSIS — N2581 Secondary hyperparathyroidism of renal origin: Secondary | ICD-10-CM | POA: Diagnosis not present

## 2023-09-16 DIAGNOSIS — Z992 Dependence on renal dialysis: Secondary | ICD-10-CM | POA: Diagnosis not present

## 2023-09-16 DIAGNOSIS — R110 Nausea: Secondary | ICD-10-CM | POA: Diagnosis not present

## 2023-09-16 DIAGNOSIS — N186 End stage renal disease: Secondary | ICD-10-CM | POA: Diagnosis not present

## 2023-09-16 DIAGNOSIS — N2581 Secondary hyperparathyroidism of renal origin: Secondary | ICD-10-CM | POA: Diagnosis not present

## 2023-09-18 DIAGNOSIS — N2581 Secondary hyperparathyroidism of renal origin: Secondary | ICD-10-CM | POA: Diagnosis not present

## 2023-09-18 DIAGNOSIS — R110 Nausea: Secondary | ICD-10-CM | POA: Diagnosis not present

## 2023-09-18 DIAGNOSIS — N186 End stage renal disease: Secondary | ICD-10-CM | POA: Diagnosis not present

## 2023-09-18 DIAGNOSIS — Z992 Dependence on renal dialysis: Secondary | ICD-10-CM | POA: Diagnosis not present

## 2023-09-21 DIAGNOSIS — R110 Nausea: Secondary | ICD-10-CM | POA: Diagnosis not present

## 2023-09-21 DIAGNOSIS — N186 End stage renal disease: Secondary | ICD-10-CM | POA: Diagnosis not present

## 2023-09-21 DIAGNOSIS — Z992 Dependence on renal dialysis: Secondary | ICD-10-CM | POA: Diagnosis not present

## 2023-09-21 DIAGNOSIS — N2581 Secondary hyperparathyroidism of renal origin: Secondary | ICD-10-CM | POA: Diagnosis not present

## 2023-09-25 DIAGNOSIS — M3214 Glomerular disease in systemic lupus erythematosus: Secondary | ICD-10-CM | POA: Diagnosis not present

## 2023-09-25 DIAGNOSIS — N2581 Secondary hyperparathyroidism of renal origin: Secondary | ICD-10-CM | POA: Diagnosis not present

## 2023-09-25 DIAGNOSIS — Z992 Dependence on renal dialysis: Secondary | ICD-10-CM | POA: Diagnosis not present

## 2023-09-25 DIAGNOSIS — N186 End stage renal disease: Secondary | ICD-10-CM | POA: Diagnosis not present

## 2023-09-25 DIAGNOSIS — R110 Nausea: Secondary | ICD-10-CM | POA: Diagnosis not present

## 2023-09-27 ENCOUNTER — Other Ambulatory Visit (HOSPITAL_COMMUNITY): Payer: Self-pay | Admitting: Nephrology

## 2023-09-27 DIAGNOSIS — R188 Other ascites: Secondary | ICD-10-CM

## 2023-09-28 DIAGNOSIS — N2581 Secondary hyperparathyroidism of renal origin: Secondary | ICD-10-CM | POA: Diagnosis not present

## 2023-09-28 DIAGNOSIS — R110 Nausea: Secondary | ICD-10-CM | POA: Diagnosis not present

## 2023-09-28 DIAGNOSIS — Z992 Dependence on renal dialysis: Secondary | ICD-10-CM | POA: Diagnosis not present

## 2023-09-28 DIAGNOSIS — N186 End stage renal disease: Secondary | ICD-10-CM | POA: Diagnosis not present

## 2023-09-30 DIAGNOSIS — Z992 Dependence on renal dialysis: Secondary | ICD-10-CM | POA: Diagnosis not present

## 2023-09-30 DIAGNOSIS — N2581 Secondary hyperparathyroidism of renal origin: Secondary | ICD-10-CM | POA: Diagnosis not present

## 2023-09-30 DIAGNOSIS — R110 Nausea: Secondary | ICD-10-CM | POA: Diagnosis not present

## 2023-09-30 DIAGNOSIS — N186 End stage renal disease: Secondary | ICD-10-CM | POA: Diagnosis not present

## 2023-10-01 ENCOUNTER — Ambulatory Visit (HOSPITAL_COMMUNITY)
Admission: RE | Admit: 2023-10-01 | Discharge: 2023-10-01 | Disposition: A | Discharge status: Home / Self Care | Source: Ambulatory Visit | Attending: Nephrology | Admitting: Nephrology

## 2023-10-01 DIAGNOSIS — R188 Other ascites: Secondary | ICD-10-CM | POA: Insufficient documentation

## 2023-10-01 DIAGNOSIS — Z8619 Personal history of other infectious and parasitic diseases: Secondary | ICD-10-CM | POA: Insufficient documentation

## 2023-10-01 DIAGNOSIS — N186 End stage renal disease: Secondary | ICD-10-CM | POA: Diagnosis not present

## 2023-10-01 HISTORY — PX: IR PARACENTESIS: IMG2679

## 2023-10-01 MED ORDER — LIDOCAINE HCL 1 % IJ SOLN
INTRAMUSCULAR | Status: AC
  Filled 2023-10-01: qty 20

## 2023-10-01 MED ORDER — ALBUMIN HUMAN 25 % IV SOLN
INTRAVENOUS | Status: AC
  Filled 2023-10-01: qty 100

## 2023-10-01 MED ORDER — LIDOCAINE HCL 1 % IJ SOLN: 10.0000 mL | Freq: Once | INTRAMUSCULAR | Status: DC

## 2023-10-01 MED ORDER — ALBUMIN HUMAN 25 % IV SOLN
25.0000 g | Freq: Once | INTRAVENOUS | Status: AC
  Administered 2023-10-01: 25 g via INTRAVENOUS

## 2023-10-01 NOTE — Procedures (Signed)
 PROCEDURE SUMMARY:  Successful image-guided paracentesis from the left abdomen.  Yielded 2.6 liters of yellow fluid.  No immediate complications.  EBL: trace Patient tolerated well.   Specimen not sent for labs.  Please see imaging section of Epic for full dictation.  Damian Duke Jadarion Halbig PA-C 10/01/2023 10:35 AM

## 2023-10-02 DIAGNOSIS — Z992 Dependence on renal dialysis: Secondary | ICD-10-CM | POA: Diagnosis not present

## 2023-10-02 DIAGNOSIS — N186 End stage renal disease: Secondary | ICD-10-CM | POA: Diagnosis not present

## 2023-10-02 DIAGNOSIS — R110 Nausea: Secondary | ICD-10-CM | POA: Diagnosis not present

## 2023-10-02 DIAGNOSIS — N2581 Secondary hyperparathyroidism of renal origin: Secondary | ICD-10-CM | POA: Diagnosis not present

## 2023-10-05 DIAGNOSIS — R110 Nausea: Secondary | ICD-10-CM | POA: Diagnosis not present

## 2023-10-05 DIAGNOSIS — N186 End stage renal disease: Secondary | ICD-10-CM | POA: Diagnosis not present

## 2023-10-05 DIAGNOSIS — Z992 Dependence on renal dialysis: Secondary | ICD-10-CM | POA: Diagnosis not present

## 2023-10-05 DIAGNOSIS — N2581 Secondary hyperparathyroidism of renal origin: Secondary | ICD-10-CM | POA: Diagnosis not present

## 2023-10-07 DIAGNOSIS — R110 Nausea: Secondary | ICD-10-CM | POA: Diagnosis not present

## 2023-10-07 DIAGNOSIS — N186 End stage renal disease: Secondary | ICD-10-CM | POA: Diagnosis not present

## 2023-10-07 DIAGNOSIS — Z992 Dependence on renal dialysis: Secondary | ICD-10-CM | POA: Diagnosis not present

## 2023-10-07 DIAGNOSIS — N2581 Secondary hyperparathyroidism of renal origin: Secondary | ICD-10-CM | POA: Diagnosis not present

## 2023-10-09 DIAGNOSIS — N186 End stage renal disease: Secondary | ICD-10-CM | POA: Diagnosis not present

## 2023-10-09 DIAGNOSIS — Z992 Dependence on renal dialysis: Secondary | ICD-10-CM | POA: Diagnosis not present

## 2023-10-09 DIAGNOSIS — D631 Anemia in chronic kidney disease: Secondary | ICD-10-CM | POA: Diagnosis not present

## 2023-10-12 DIAGNOSIS — Z992 Dependence on renal dialysis: Secondary | ICD-10-CM | POA: Diagnosis not present

## 2023-10-12 DIAGNOSIS — N186 End stage renal disease: Secondary | ICD-10-CM | POA: Diagnosis not present

## 2023-10-16 DIAGNOSIS — Z992 Dependence on renal dialysis: Secondary | ICD-10-CM | POA: Diagnosis not present

## 2023-10-16 DIAGNOSIS — N186 End stage renal disease: Secondary | ICD-10-CM | POA: Diagnosis not present

## 2023-10-16 DIAGNOSIS — N2581 Secondary hyperparathyroidism of renal origin: Secondary | ICD-10-CM | POA: Diagnosis not present

## 2023-10-19 DIAGNOSIS — N2581 Secondary hyperparathyroidism of renal origin: Secondary | ICD-10-CM | POA: Diagnosis not present

## 2023-10-19 DIAGNOSIS — N186 End stage renal disease: Secondary | ICD-10-CM | POA: Diagnosis not present

## 2023-10-19 DIAGNOSIS — Z992 Dependence on renal dialysis: Secondary | ICD-10-CM | POA: Diagnosis not present

## 2023-10-21 DIAGNOSIS — N2581 Secondary hyperparathyroidism of renal origin: Secondary | ICD-10-CM | POA: Diagnosis not present

## 2023-10-21 DIAGNOSIS — N186 End stage renal disease: Secondary | ICD-10-CM | POA: Diagnosis not present

## 2023-10-21 DIAGNOSIS — Z992 Dependence on renal dialysis: Secondary | ICD-10-CM | POA: Diagnosis not present

## 2023-10-23 DIAGNOSIS — N2581 Secondary hyperparathyroidism of renal origin: Secondary | ICD-10-CM | POA: Diagnosis not present

## 2023-10-23 DIAGNOSIS — Z992 Dependence on renal dialysis: Secondary | ICD-10-CM | POA: Diagnosis not present

## 2023-10-23 DIAGNOSIS — N186 End stage renal disease: Secondary | ICD-10-CM | POA: Diagnosis not present

## 2023-10-25 ENCOUNTER — Other Ambulatory Visit (HOSPITAL_COMMUNITY): Payer: Self-pay | Admitting: *Deleted

## 2023-10-25 DIAGNOSIS — N186 End stage renal disease: Secondary | ICD-10-CM | POA: Diagnosis not present

## 2023-10-25 DIAGNOSIS — Z992 Dependence on renal dialysis: Secondary | ICD-10-CM | POA: Diagnosis not present

## 2023-10-25 DIAGNOSIS — D649 Anemia, unspecified: Secondary | ICD-10-CM

## 2023-10-25 DIAGNOSIS — M3214 Glomerular disease in systemic lupus erythematosus: Secondary | ICD-10-CM | POA: Diagnosis not present

## 2023-10-26 DIAGNOSIS — Z992 Dependence on renal dialysis: Secondary | ICD-10-CM | POA: Diagnosis not present

## 2023-10-26 DIAGNOSIS — R110 Nausea: Secondary | ICD-10-CM | POA: Diagnosis not present

## 2023-10-26 DIAGNOSIS — N2581 Secondary hyperparathyroidism of renal origin: Secondary | ICD-10-CM | POA: Diagnosis not present

## 2023-10-26 DIAGNOSIS — N186 End stage renal disease: Secondary | ICD-10-CM | POA: Diagnosis not present

## 2023-10-27 ENCOUNTER — Ambulatory Visit (HOSPITAL_COMMUNITY)
Admission: RE | Admit: 2023-10-27 | Discharge: 2023-10-27 | Disposition: A | Discharge status: Home / Self Care | Source: Ambulatory Visit | Attending: Nephrology | Admitting: Nephrology

## 2023-10-27 DIAGNOSIS — D649 Anemia, unspecified: Secondary | ICD-10-CM | POA: Diagnosis not present

## 2023-10-27 LAB — PREPARE RBC (CROSSMATCH)

## 2023-10-27 MED ORDER — SODIUM CHLORIDE 0.9% IV SOLUTION: Freq: Once | INTRAVENOUS | Status: DC

## 2023-10-28 DIAGNOSIS — N186 End stage renal disease: Secondary | ICD-10-CM | POA: Diagnosis not present

## 2023-10-28 DIAGNOSIS — R110 Nausea: Secondary | ICD-10-CM | POA: Diagnosis not present

## 2023-10-28 DIAGNOSIS — Z992 Dependence on renal dialysis: Secondary | ICD-10-CM | POA: Diagnosis not present

## 2023-10-28 DIAGNOSIS — N2581 Secondary hyperparathyroidism of renal origin: Secondary | ICD-10-CM | POA: Diagnosis not present

## 2023-10-28 LAB — BPAM RBC
Blood Product Expiration Date: 202508062359
ISSUE DATE / TIME: 202507020938
Unit Type and Rh: 5100

## 2023-10-28 LAB — TYPE AND SCREEN
ABO/RH(D): O POS
Antibody Screen: NEGATIVE
Unit division: 0

## 2023-10-30 DIAGNOSIS — R110 Nausea: Secondary | ICD-10-CM | POA: Diagnosis not present

## 2023-10-30 DIAGNOSIS — N2581 Secondary hyperparathyroidism of renal origin: Secondary | ICD-10-CM | POA: Diagnosis not present

## 2023-10-30 DIAGNOSIS — Z992 Dependence on renal dialysis: Secondary | ICD-10-CM | POA: Diagnosis not present

## 2023-10-30 DIAGNOSIS — N186 End stage renal disease: Secondary | ICD-10-CM | POA: Diagnosis not present

## 2023-11-02 DIAGNOSIS — R110 Nausea: Secondary | ICD-10-CM | POA: Diagnosis not present

## 2023-11-02 DIAGNOSIS — Z992 Dependence on renal dialysis: Secondary | ICD-10-CM | POA: Diagnosis not present

## 2023-11-02 DIAGNOSIS — N2581 Secondary hyperparathyroidism of renal origin: Secondary | ICD-10-CM | POA: Diagnosis not present

## 2023-11-02 DIAGNOSIS — N186 End stage renal disease: Secondary | ICD-10-CM | POA: Diagnosis not present

## 2023-11-06 DIAGNOSIS — N186 End stage renal disease: Secondary | ICD-10-CM | POA: Diagnosis not present

## 2023-11-06 DIAGNOSIS — N2581 Secondary hyperparathyroidism of renal origin: Secondary | ICD-10-CM | POA: Diagnosis not present

## 2023-11-06 DIAGNOSIS — R110 Nausea: Secondary | ICD-10-CM | POA: Diagnosis not present

## 2023-11-06 DIAGNOSIS — Z992 Dependence on renal dialysis: Secondary | ICD-10-CM | POA: Diagnosis not present

## 2023-11-09 DIAGNOSIS — Z992 Dependence on renal dialysis: Secondary | ICD-10-CM | POA: Diagnosis not present

## 2023-11-09 DIAGNOSIS — N186 End stage renal disease: Secondary | ICD-10-CM | POA: Diagnosis not present

## 2023-11-09 DIAGNOSIS — N2581 Secondary hyperparathyroidism of renal origin: Secondary | ICD-10-CM | POA: Diagnosis not present

## 2023-11-11 DIAGNOSIS — Z992 Dependence on renal dialysis: Secondary | ICD-10-CM | POA: Diagnosis not present

## 2023-11-11 DIAGNOSIS — N2581 Secondary hyperparathyroidism of renal origin: Secondary | ICD-10-CM | POA: Diagnosis not present

## 2023-11-11 DIAGNOSIS — N186 End stage renal disease: Secondary | ICD-10-CM | POA: Diagnosis not present

## 2023-11-13 DIAGNOSIS — Z992 Dependence on renal dialysis: Secondary | ICD-10-CM | POA: Diagnosis not present

## 2023-11-13 DIAGNOSIS — N186 End stage renal disease: Secondary | ICD-10-CM | POA: Diagnosis not present

## 2023-11-13 DIAGNOSIS — N2581 Secondary hyperparathyroidism of renal origin: Secondary | ICD-10-CM | POA: Diagnosis not present

## 2023-11-16 DIAGNOSIS — Z992 Dependence on renal dialysis: Secondary | ICD-10-CM | POA: Diagnosis not present

## 2023-11-16 DIAGNOSIS — N2581 Secondary hyperparathyroidism of renal origin: Secondary | ICD-10-CM | POA: Diagnosis not present

## 2023-11-16 DIAGNOSIS — N186 End stage renal disease: Secondary | ICD-10-CM | POA: Diagnosis not present

## 2023-11-20 DIAGNOSIS — N186 End stage renal disease: Secondary | ICD-10-CM | POA: Diagnosis not present

## 2023-11-20 DIAGNOSIS — N2581 Secondary hyperparathyroidism of renal origin: Secondary | ICD-10-CM | POA: Diagnosis not present

## 2023-11-20 DIAGNOSIS — Z992 Dependence on renal dialysis: Secondary | ICD-10-CM | POA: Diagnosis not present

## 2023-11-23 DIAGNOSIS — N2581 Secondary hyperparathyroidism of renal origin: Secondary | ICD-10-CM | POA: Diagnosis not present

## 2023-11-23 DIAGNOSIS — N186 End stage renal disease: Secondary | ICD-10-CM | POA: Diagnosis not present

## 2023-11-23 DIAGNOSIS — Z992 Dependence on renal dialysis: Secondary | ICD-10-CM | POA: Diagnosis not present

## 2023-11-25 DIAGNOSIS — M3214 Glomerular disease in systemic lupus erythematosus: Secondary | ICD-10-CM | POA: Diagnosis not present

## 2023-11-25 DIAGNOSIS — N2581 Secondary hyperparathyroidism of renal origin: Secondary | ICD-10-CM | POA: Diagnosis not present

## 2023-11-25 DIAGNOSIS — Z992 Dependence on renal dialysis: Secondary | ICD-10-CM | POA: Diagnosis not present

## 2023-11-25 DIAGNOSIS — N186 End stage renal disease: Secondary | ICD-10-CM | POA: Diagnosis not present

## 2023-11-27 DIAGNOSIS — Z992 Dependence on renal dialysis: Secondary | ICD-10-CM | POA: Diagnosis not present

## 2023-11-27 DIAGNOSIS — R110 Nausea: Secondary | ICD-10-CM | POA: Diagnosis not present

## 2023-11-27 DIAGNOSIS — N2581 Secondary hyperparathyroidism of renal origin: Secondary | ICD-10-CM | POA: Diagnosis not present

## 2023-11-27 DIAGNOSIS — N186 End stage renal disease: Secondary | ICD-10-CM | POA: Diagnosis not present

## 2023-12-02 DIAGNOSIS — R110 Nausea: Secondary | ICD-10-CM | POA: Diagnosis not present

## 2023-12-02 DIAGNOSIS — Z992 Dependence on renal dialysis: Secondary | ICD-10-CM | POA: Diagnosis not present

## 2023-12-02 DIAGNOSIS — N2581 Secondary hyperparathyroidism of renal origin: Secondary | ICD-10-CM | POA: Diagnosis not present

## 2023-12-02 DIAGNOSIS — N186 End stage renal disease: Secondary | ICD-10-CM | POA: Diagnosis not present

## 2023-12-04 DIAGNOSIS — Z992 Dependence on renal dialysis: Secondary | ICD-10-CM | POA: Diagnosis not present

## 2023-12-04 DIAGNOSIS — N186 End stage renal disease: Secondary | ICD-10-CM | POA: Diagnosis not present

## 2023-12-04 DIAGNOSIS — N2581 Secondary hyperparathyroidism of renal origin: Secondary | ICD-10-CM | POA: Diagnosis not present

## 2023-12-04 DIAGNOSIS — R110 Nausea: Secondary | ICD-10-CM | POA: Diagnosis not present

## 2023-12-07 DIAGNOSIS — Z992 Dependence on renal dialysis: Secondary | ICD-10-CM | POA: Diagnosis not present

## 2023-12-07 DIAGNOSIS — N186 End stage renal disease: Secondary | ICD-10-CM | POA: Diagnosis not present

## 2023-12-07 DIAGNOSIS — R110 Nausea: Secondary | ICD-10-CM | POA: Diagnosis not present

## 2023-12-07 DIAGNOSIS — N2581 Secondary hyperparathyroidism of renal origin: Secondary | ICD-10-CM | POA: Diagnosis not present

## 2023-12-09 DIAGNOSIS — Z992 Dependence on renal dialysis: Secondary | ICD-10-CM | POA: Diagnosis not present

## 2023-12-09 DIAGNOSIS — R110 Nausea: Secondary | ICD-10-CM | POA: Diagnosis not present

## 2023-12-09 DIAGNOSIS — N186 End stage renal disease: Secondary | ICD-10-CM | POA: Diagnosis not present

## 2023-12-09 DIAGNOSIS — N2581 Secondary hyperparathyroidism of renal origin: Secondary | ICD-10-CM | POA: Diagnosis not present

## 2023-12-11 DIAGNOSIS — N2581 Secondary hyperparathyroidism of renal origin: Secondary | ICD-10-CM | POA: Diagnosis not present

## 2023-12-11 DIAGNOSIS — R110 Nausea: Secondary | ICD-10-CM | POA: Diagnosis not present

## 2023-12-11 DIAGNOSIS — Z992 Dependence on renal dialysis: Secondary | ICD-10-CM | POA: Diagnosis not present

## 2023-12-11 DIAGNOSIS — N186 End stage renal disease: Secondary | ICD-10-CM | POA: Diagnosis not present

## 2023-12-14 ENCOUNTER — Other Ambulatory Visit: Payer: Self-pay

## 2023-12-14 ENCOUNTER — Emergency Department (HOSPITAL_COMMUNITY)

## 2023-12-14 ENCOUNTER — Encounter (HOSPITAL_COMMUNITY): Payer: Self-pay

## 2023-12-14 ENCOUNTER — Inpatient Hospital Stay (HOSPITAL_COMMUNITY)
Admission: EM | Admit: 2023-12-14 | Discharge: 2024-01-03 | DRG: 871 | Disposition: A | Discharge status: Home / Self Care | Attending: Student | Admitting: Student

## 2023-12-14 DIAGNOSIS — I120 Hypertensive chronic kidney disease with stage 5 chronic kidney disease or end stage renal disease: Secondary | ICD-10-CM | POA: Diagnosis not present

## 2023-12-14 DIAGNOSIS — S0990XA Unspecified injury of head, initial encounter: Secondary | ICD-10-CM | POA: Diagnosis not present

## 2023-12-14 DIAGNOSIS — R443 Hallucinations, unspecified: Secondary | ICD-10-CM | POA: Diagnosis not present

## 2023-12-14 DIAGNOSIS — R932 Abnormal findings on diagnostic imaging of liver and biliary tract: Secondary | ICD-10-CM | POA: Diagnosis not present

## 2023-12-14 DIAGNOSIS — E875 Hyperkalemia: Secondary | ICD-10-CM | POA: Diagnosis present

## 2023-12-14 DIAGNOSIS — R636 Underweight: Secondary | ICD-10-CM | POA: Diagnosis not present

## 2023-12-14 DIAGNOSIS — R64 Cachexia: Secondary | ICD-10-CM | POA: Diagnosis present

## 2023-12-14 DIAGNOSIS — R918 Other nonspecific abnormal finding of lung field: Secondary | ICD-10-CM | POA: Diagnosis not present

## 2023-12-14 DIAGNOSIS — G9341 Metabolic encephalopathy: Secondary | ICD-10-CM | POA: Diagnosis not present

## 2023-12-14 DIAGNOSIS — K7460 Unspecified cirrhosis of liver: Secondary | ICD-10-CM | POA: Diagnosis present

## 2023-12-14 DIAGNOSIS — M3214 Glomerular disease in systemic lupus erythematosus: Secondary | ICD-10-CM | POA: Diagnosis not present

## 2023-12-14 DIAGNOSIS — E871 Hypo-osmolality and hyponatremia: Secondary | ICD-10-CM | POA: Diagnosis not present

## 2023-12-14 DIAGNOSIS — R188 Other ascites: Secondary | ICD-10-CM | POA: Diagnosis not present

## 2023-12-14 DIAGNOSIS — R1084 Generalized abdominal pain: Principal | ICD-10-CM

## 2023-12-14 DIAGNOSIS — K7689 Other specified diseases of liver: Secondary | ICD-10-CM | POA: Diagnosis not present

## 2023-12-14 DIAGNOSIS — M7989 Other specified soft tissue disorders: Secondary | ICD-10-CM | POA: Diagnosis not present

## 2023-12-14 DIAGNOSIS — K652 Spontaneous bacterial peritonitis: Secondary | ICD-10-CM | POA: Diagnosis not present

## 2023-12-14 DIAGNOSIS — Z6820 Body mass index (BMI) 20.0-20.9, adult: Secondary | ICD-10-CM

## 2023-12-14 DIAGNOSIS — Z79890 Hormone replacement therapy (postmenopausal): Secondary | ICD-10-CM

## 2023-12-14 DIAGNOSIS — I132 Hypertensive heart and chronic kidney disease with heart failure and with stage 5 chronic kidney disease, or end stage renal disease: Secondary | ICD-10-CM | POA: Diagnosis present

## 2023-12-14 DIAGNOSIS — E872 Acidosis, unspecified: Secondary | ICD-10-CM | POA: Diagnosis present

## 2023-12-14 DIAGNOSIS — K7031 Alcoholic cirrhosis of liver with ascites: Secondary | ICD-10-CM | POA: Diagnosis not present

## 2023-12-14 DIAGNOSIS — B961 Klebsiella pneumoniae [K. pneumoniae] as the cause of diseases classified elsewhere: Secondary | ICD-10-CM | POA: Diagnosis present

## 2023-12-14 DIAGNOSIS — E039 Hypothyroidism, unspecified: Secondary | ICD-10-CM | POA: Diagnosis not present

## 2023-12-14 DIAGNOSIS — A419 Sepsis, unspecified organism: Secondary | ICD-10-CM | POA: Diagnosis not present

## 2023-12-14 DIAGNOSIS — K5900 Constipation, unspecified: Secondary | ICD-10-CM | POA: Diagnosis not present

## 2023-12-14 DIAGNOSIS — F10931 Alcohol use, unspecified with withdrawal delirium: Secondary | ICD-10-CM | POA: Diagnosis not present

## 2023-12-14 DIAGNOSIS — R161 Splenomegaly, not elsewhere classified: Secondary | ICD-10-CM | POA: Diagnosis not present

## 2023-12-14 DIAGNOSIS — Z681 Body mass index (BMI) 19 or less, adult: Secondary | ICD-10-CM | POA: Diagnosis not present

## 2023-12-14 DIAGNOSIS — K2100 Gastro-esophageal reflux disease with esophagitis, without bleeding: Secondary | ICD-10-CM | POA: Diagnosis present

## 2023-12-14 DIAGNOSIS — R112 Nausea with vomiting, unspecified: Secondary | ICD-10-CM | POA: Diagnosis not present

## 2023-12-14 DIAGNOSIS — M25521 Pain in right elbow: Secondary | ICD-10-CM | POA: Diagnosis not present

## 2023-12-14 DIAGNOSIS — Z7141 Alcohol abuse counseling and surveillance of alcoholic: Secondary | ICD-10-CM

## 2023-12-14 DIAGNOSIS — Z781 Physical restraint status: Secondary | ICD-10-CM

## 2023-12-14 DIAGNOSIS — D849 Immunodeficiency, unspecified: Secondary | ICD-10-CM | POA: Diagnosis not present

## 2023-12-14 DIAGNOSIS — D61818 Other pancytopenia: Secondary | ICD-10-CM | POA: Diagnosis not present

## 2023-12-14 DIAGNOSIS — W19XXXA Unspecified fall, initial encounter: Secondary | ICD-10-CM | POA: Diagnosis not present

## 2023-12-14 DIAGNOSIS — I5022 Chronic systolic (congestive) heart failure: Secondary | ICD-10-CM | POA: Diagnosis present

## 2023-12-14 DIAGNOSIS — N2581 Secondary hyperparathyroidism of renal origin: Secondary | ICD-10-CM | POA: Diagnosis present

## 2023-12-14 DIAGNOSIS — I10 Essential (primary) hypertension: Secondary | ICD-10-CM | POA: Diagnosis present

## 2023-12-14 DIAGNOSIS — D631 Anemia in chronic kidney disease: Secondary | ICD-10-CM | POA: Diagnosis not present

## 2023-12-14 DIAGNOSIS — I5021 Acute systolic (congestive) heart failure: Secondary | ICD-10-CM | POA: Diagnosis not present

## 2023-12-14 DIAGNOSIS — Z992 Dependence on renal dialysis: Secondary | ICD-10-CM | POA: Diagnosis not present

## 2023-12-14 DIAGNOSIS — R109 Unspecified abdominal pain: Secondary | ICD-10-CM | POA: Diagnosis not present

## 2023-12-14 DIAGNOSIS — Z7982 Long term (current) use of aspirin: Secondary | ICD-10-CM

## 2023-12-14 DIAGNOSIS — J90 Pleural effusion, not elsewhere classified: Secondary | ICD-10-CM | POA: Diagnosis not present

## 2023-12-14 DIAGNOSIS — E8809 Other disorders of plasma-protein metabolism, not elsewhere classified: Secondary | ICD-10-CM | POA: Diagnosis present

## 2023-12-14 DIAGNOSIS — M329 Systemic lupus erythematosus, unspecified: Secondary | ICD-10-CM | POA: Diagnosis not present

## 2023-12-14 DIAGNOSIS — F10139 Alcohol abuse with withdrawal, unspecified: Secondary | ICD-10-CM | POA: Diagnosis not present

## 2023-12-14 DIAGNOSIS — N186 End stage renal disease: Secondary | ICD-10-CM | POA: Diagnosis not present

## 2023-12-14 DIAGNOSIS — R0989 Other specified symptoms and signs involving the circulatory and respiratory systems: Secondary | ICD-10-CM | POA: Diagnosis not present

## 2023-12-14 DIAGNOSIS — Z5329 Procedure and treatment not carried out because of patient's decision for other reasons: Secondary | ICD-10-CM | POA: Diagnosis not present

## 2023-12-14 DIAGNOSIS — M3213 Lung involvement in systemic lupus erythematosus: Secondary | ICD-10-CM | POA: Diagnosis not present

## 2023-12-14 DIAGNOSIS — Z79899 Other long term (current) drug therapy: Secondary | ICD-10-CM

## 2023-12-14 DIAGNOSIS — I700 Atherosclerosis of aorta: Secondary | ICD-10-CM | POA: Diagnosis not present

## 2023-12-14 DIAGNOSIS — I517 Cardiomegaly: Secondary | ICD-10-CM | POA: Diagnosis not present

## 2023-12-14 DIAGNOSIS — I25118 Atherosclerotic heart disease of native coronary artery with other forms of angina pectoris: Secondary | ICD-10-CM

## 2023-12-14 HISTORY — DX: End stage renal disease: Z99.2

## 2023-12-14 HISTORY — DX: End stage renal disease: N18.6

## 2023-12-14 LAB — CBC
HCT: 39.2 % (ref 39.0–52.0)
Hemoglobin: 12.4 g/dL — ABNORMAL LOW (ref 13.0–17.0)
MCH: 28.4 pg (ref 26.0–34.0)
MCHC: 31.6 g/dL (ref 30.0–36.0)
MCV: 89.7 fL (ref 80.0–100.0)
Platelets: 77 K/uL — ABNORMAL LOW (ref 150–400)
RBC: 4.37 MIL/uL (ref 4.22–5.81)
RDW: 17.3 % — ABNORMAL HIGH (ref 11.5–15.5)
WBC: 1.7 K/uL — ABNORMAL LOW (ref 4.0–10.5)
nRBC: 0 % (ref 0.0–0.2)

## 2023-12-14 LAB — COMPREHENSIVE METABOLIC PANEL WITH GFR
ALT: 21 U/L (ref 0–44)
AST: 34 U/L (ref 15–41)
Albumin: 2.2 g/dL — ABNORMAL LOW (ref 3.5–5.0)
Alkaline Phosphatase: 68 U/L (ref 38–126)
Anion gap: 14 (ref 5–15)
BUN: 56 mg/dL — ABNORMAL HIGH (ref 6–20)
CO2: 24 mmol/L (ref 22–32)
Calcium: 8.3 mg/dL — ABNORMAL LOW (ref 8.9–10.3)
Chloride: 95 mmol/L — ABNORMAL LOW (ref 98–111)
Creatinine, Ser: 7.9 mg/dL — ABNORMAL HIGH (ref 0.61–1.24)
GFR, Estimated: 7 mL/min — ABNORMAL LOW (ref >60)
Glucose, Bld: 85 mg/dL (ref 70–99)
Potassium: 5.5 mmol/L — ABNORMAL HIGH (ref 3.5–5.1)
Sodium: 133 mmol/L — ABNORMAL LOW (ref 135–145)
Total Bilirubin: 1 mg/dL (ref 0.0–1.2)
Total Protein: 8.3 g/dL — ABNORMAL HIGH (ref 6.5–8.1)

## 2023-12-14 LAB — BODY FLUID CELL COUNT WITH DIFFERENTIAL
Eos, Fluid: 1 %
Lymphs, Fluid: 2 %
Monocyte-Macrophage-Serous Fluid: 1 % — ABNORMAL LOW (ref 50–90)
Neutrophil Count, Fluid: 95 % — ABNORMAL HIGH (ref 0–25)
Other Cells, Fluid: 1 %
Total Nucleated Cell Count, Fluid: 3650 uL — ABNORMAL HIGH (ref 0–1000)

## 2023-12-14 LAB — I-STAT CG4 LACTIC ACID, ED: Lactic Acid, Venous: 2 mmol/L (ref 0.5–1.9)

## 2023-12-14 LAB — HIV ANTIBODY (ROUTINE TESTING W REFLEX): HIV Screen 4th Generation wRfx: NONREACTIVE

## 2023-12-14 LAB — BRAIN NATRIURETIC PEPTIDE: B Natriuretic Peptide: 1756.7 pg/mL — ABNORMAL HIGH (ref 0.0–100.0)

## 2023-12-14 LAB — LIPASE, BLOOD: Lipase: 31 U/L (ref 11–51)

## 2023-12-14 LAB — HEPATITIS B SURFACE ANTIGEN: Hepatitis B Surface Ag: NONREACTIVE

## 2023-12-14 MED ORDER — MORPHINE SULFATE (PF) 4 MG/ML IV SOLN
6.0000 mg | Freq: Once | INTRAVENOUS | Status: AC
  Administered 2023-12-14: 6 mg via INTRAVENOUS
  Filled 2023-12-14: qty 2

## 2023-12-14 MED ORDER — CARVEDILOL 25 MG PO TABS
25.0000 mg | ORAL_TABLET | Freq: Two times a day (BID) | ORAL | Status: DC
  Administered 2023-12-14 – 2024-01-02 (×21): 25 mg via ORAL
  Filled 2023-12-14 (×2): qty 1
  Filled 2023-12-14: qty 2
  Filled 2023-12-14 (×2): qty 1
  Filled 2023-12-14 (×2): qty 2
  Filled 2023-12-14 (×2): qty 1
  Filled 2023-12-14: qty 2
  Filled 2023-12-14 (×4): qty 1
  Filled 2023-12-14 (×2): qty 2
  Filled 2023-12-14: qty 1
  Filled 2023-12-14: qty 2
  Filled 2023-12-14 (×6): qty 1
  Filled 2023-12-14: qty 2
  Filled 2023-12-14 (×2): qty 1

## 2023-12-14 MED ORDER — ONDANSETRON HCL 4 MG PO TABS: 4.0000 mg | ORAL_TABLET | Freq: Four times a day (QID) | ORAL | Status: DC | PRN

## 2023-12-14 MED ORDER — LEVOTHYROXINE SODIUM 50 MCG PO TABS
50.0000 ug | ORAL_TABLET | Freq: Every day | ORAL | Status: DC
  Administered 2023-12-15 – 2023-12-30 (×14): 50 ug via ORAL
  Filled 2023-12-14 (×15): qty 1

## 2023-12-14 MED ORDER — ONDANSETRON HCL 4 MG/2ML IJ SOLN
4.0000 mg | Freq: Four times a day (QID) | INTRAMUSCULAR | Status: DC | PRN
  Administered 2023-12-15 – 2024-01-03 (×10): 4 mg via INTRAVENOUS
  Filled 2023-12-14 (×9): qty 2

## 2023-12-14 MED ORDER — IOHEXOL 350 MG/ML SOLN
75.0000 mL | Freq: Once | INTRAVENOUS | Status: AC | PRN
  Administered 2023-12-14: 75 mL via INTRAVENOUS

## 2023-12-14 MED ORDER — MORPHINE SULFATE (PF) 2 MG/ML IV SOLN
2.0000 mg | Freq: Once | INTRAVENOUS | Status: AC
  Administered 2023-12-14: 2 mg via INTRAVENOUS
  Filled 2023-12-14: qty 1

## 2023-12-14 MED ORDER — CHLORHEXIDINE GLUCONATE CLOTH 2 % EX PADS
6.0000 | MEDICATED_PAD | Freq: Every day | CUTANEOUS | Status: DC
  Administered 2023-12-20 – 2023-12-30 (×5): 6 via TOPICAL

## 2023-12-14 MED ORDER — ACETAMINOPHEN 650 MG RE SUPP: 650.0000 mg | Freq: Four times a day (QID) | RECTAL | Status: DC | PRN

## 2023-12-14 MED ORDER — ACETAMINOPHEN 325 MG PO TABS
650.0000 mg | ORAL_TABLET | Freq: Four times a day (QID) | ORAL | Status: DC | PRN
  Administered 2023-12-14 – 2023-12-22 (×7): 650 mg via ORAL
  Filled 2023-12-14 (×9): qty 2

## 2023-12-14 MED ORDER — HYDROMORPHONE HCL 1 MG/ML IJ SOLN
0.5000 mg | INTRAMUSCULAR | Status: DC | PRN
  Administered 2023-12-14 – 2023-12-16 (×9): 0.5 mg via INTRAVENOUS
  Filled 2023-12-14 (×8): qty 0.5

## 2023-12-14 MED ORDER — ONDANSETRON 4 MG PO TBDP
4.0000 mg | ORAL_TABLET | Freq: Once | ORAL | Status: AC
  Administered 2023-12-14: 4 mg via ORAL
  Filled 2023-12-14: qty 1

## 2023-12-14 MED ORDER — SODIUM CHLORIDE 0.9 % IV SOLN
2.0000 g | INTRAVENOUS | Status: DC
  Administered 2023-12-15 – 2023-12-21 (×5): 2 g via INTRAVENOUS
  Filled 2023-12-14 (×7): qty 20

## 2023-12-14 MED ORDER — SODIUM ZIRCONIUM CYCLOSILICATE 10 G PO PACK: 10.0000 g | PACK | Freq: Once | ORAL | Status: DC

## 2023-12-14 MED ORDER — SODIUM CHLORIDE 0.9 % IV SOLN
2.0000 g | Freq: Once | INTRAVENOUS | Status: AC
  Administered 2023-12-14: 2 g via INTRAVENOUS
  Filled 2023-12-14: qty 20

## 2023-12-14 MED ORDER — METRONIDAZOLE 500 MG/100ML IV SOLN
500.0000 mg | Freq: Two times a day (BID) | INTRAVENOUS | Status: DC
  Administered 2023-12-14 – 2023-12-17 (×6): 500 mg via INTRAVENOUS
  Filled 2023-12-14 (×6): qty 100

## 2023-12-14 MED ORDER — MELATONIN 5 MG PO TABS
10.0000 mg | ORAL_TABLET | Freq: Every evening | ORAL | Status: DC | PRN
  Administered 2023-12-20 – 2024-01-03 (×7): 10 mg via ORAL
  Filled 2023-12-14 (×9): qty 2

## 2023-12-14 MED ORDER — LACTATED RINGERS IV BOLUS
500.0000 mL | Freq: Once | INTRAVENOUS | Status: AC
  Administered 2023-12-14: 500 mL via INTRAVENOUS

## 2023-12-14 MED ORDER — LIDOCAINE-EPINEPHRINE (PF) 2 %-1:200000 IJ SOLN
10.0000 mL | Freq: Once | INTRAMUSCULAR | Status: AC
  Administered 2023-12-14: 10 mL via INTRADERMAL
  Filled 2023-12-14: qty 20

## 2023-12-14 MED ORDER — ALBUTEROL SULFATE (2.5 MG/3ML) 0.083% IN NEBU: 2.5000 mg | INHALATION_SOLUTION | RESPIRATORY_TRACT | Status: DC | PRN

## 2023-12-14 MED ORDER — HYDROMORPHONE HCL 1 MG/ML IJ SOLN
INTRAMUSCULAR | Status: AC
  Filled 2023-12-14: qty 0.5

## 2023-12-14 MED ORDER — HYDROMORPHONE HCL 1 MG/ML IJ SOLN
0.5000 mg | Freq: Once | INTRAMUSCULAR | Status: AC
  Administered 2023-12-14: 0.5 mg via INTRAVENOUS
  Filled 2023-12-14: qty 1

## 2023-12-14 NOTE — ED Provider Notes (Signed)
 [place] [facility] AT [facility] Provider Note  CSN: [id number] Arrival date & time: 12/14/23 1143  Chief Complaint(s) Abdominal Pain  HPI [name] is a 53 y.o. male with PMH lupus, CKD with ESRD on hemodialysis Tuesday Thursday Saturday, CHF, alcohol abuse who presents emerged part for evaluation of abdominal pain.  States that pain significantly worsened last 24 hours and worse in the right lower quadrant.  Last dialysis session on Saturday, 12/11/2022.  Endorsing persistent nausea vomiting x 3 with associated body aches and chills.  No hematemesis or hematochezia.   Past Medical History Past Medical History:  Diagnosis Date   Chronic kidney disease    Lupus    Thyroid  disease    Patient Active Problem List   Diagnosis Date Noted   Stage 4 chronic kidney disease (HCC) 01/02/2022   Pericardial effusion 12/01/2021   Need for shingles vaccine 12/01/2021   Ascites 10/01/2020   Dependence on renal dialysis (HCC) 10/01/2020   Disorder of kidney and ureter, unspecified 10/01/2020   Essential hypertension 10/01/2020   Inflammatory arthritis 10/01/2020   Lupus nephritis (HCC) 10/01/2020   Thrombocytopenia (HCC) 10/01/2020   Stage 5 chronic kidney disease on chronic dialysis (HCC) 07/03/2020   Weight loss 07/03/2020   Malnutrition of moderate degree 05/01/2020   Acute heart failure with preserved ejection fraction (HFpEF) (HCC) 04/27/2020   Stage 3a chronic kidney disease (HCC) 01/15/2020   Acquired hypothyroidism 11/27/2019   Cardiac murmur 11/24/2019   Systolic congestive heart failure (HCC) 11/06/2019   Elevated TSH 11/06/2019   Folate deficiency 04/11/2019   B12 deficiency 04/11/2019   Pancytopenia (HCC) 02/06/2019   Alcohol use 02/06/2019   Elevated LFTs 02/06/2019   Systemic lupus erythematosus with lung involvement (HCC) 12/05/2018   Vitamin D  deficiency 12/05/2018   False positive interferon-gamma release assay (IGRA) for tuberculosis  12/05/2018   Neurotic excoriations 11/01/2018   Healthcare maintenance 11/01/2018   Polyarthralgia 06/11/2015   Pleural effusion    S/P thoracentesis    Lupus (HCC) 03/10/2015   Dyspnea 03/10/2015   Lupus (systemic lupus erythematosus) (HCC) 03/09/2015   Home Medication(s) Prior to Admission medications   Medication Sig Start Date End Date Taking? Authorizing Provider  aspirin  EC 81 MG tablet Take 1 tablet (81 mg total) by mouth daily. Swallow whole. 04/13/22   Vannie Reche RAMAN, NP  carvedilol  (COREG ) 25 MG tablet Take 1 tablet (25 mg total) by mouth 2 (two) times daily. 04/13/22 04/08/23  Walker, Caitlin S, NP  hydrALAZINE (APRESOLINE) 25 MG tablet Take 25 mg by mouth 2 (two) times daily. Patient not taking: Reported on 06/05/2022 04/23/22   [provider]  hydroxychloroquine  (PLAQUENIL ) 200 MG tablet Take 200 mg by mouth 3 (three) times a week. No set days 01/16/21   [provider]  ibuprofen  (ADVIL ) 200 MG tablet Take 200 mg by mouth every 6 (six) hours as needed for moderate pain (pain score 4-6).    [provider]  irbesartan  (AVAPRO ) 300 MG tablet Take 1 tablet (300 mg total) by mouth daily. 01/07/22   Miriam Norris, NP  levothyroxine  (SYNTHROID ) 50 MCG tablet Take 1 tablet (50 mcg total) by mouth daily before breakfast. 12/01/21   Berneta Elsie Sayre, MD  rosuvastatin  (CRESTOR ) 5 MG tablet Take 1 tablet (5 mg total) by mouth 3 (three) times a week. 04/13/22 04/21/23  Vannie Reche RAMAN, NP  Past Surgical History Past Surgical History:  Procedure Laterality Date   AV FISTULA PLACEMENT Left 08/26/2020   Procedure: LEFT RADIOCEPHALIC ARTERIOVENOUS (AV) FISTULA CREATION;  Surgeon: Eliza Lonni RAMAN, MD;  Location: [facility] OR;  Service: Vascular;  Laterality: Left;   IR FLUORO GUIDE CV LINE RIGHT  04/30/2020   IR PARACENTESIS   05/07/2020   IR PARACENTESIS  05/10/2020   IR PARACENTESIS  06/03/2020   IR PARACENTESIS  06/24/2020   IR PARACENTESIS  07/15/2020   IR PARACENTESIS  07/29/2020   IR PARACENTESIS  08/14/2020   IR PARACENTESIS  06/11/2021   IR PARACENTESIS  08/08/2021   IR PARACENTESIS  09/12/2021   IR PARACENTESIS  10/10/2021   IR PARACENTESIS  03/09/2022   IR PARACENTESIS  07/28/2023   IR PARACENTESIS  09/03/2023   IR PARACENTESIS  10/01/2023   IR PERC TUN PERIT CATH WO PORT S&I /IMAG  05/03/2020   IR US  GUIDE VASC ACCESS RIGHT  04/30/2020   IR US  GUIDE VASC ACCESS RIGHT  05/03/2020   Family History Family History  Problem Relation Age of Onset   Cancer Mother     Social History Social History   Tobacco Use   Smoking status: Never   Smokeless tobacco: Never  Vaping Use   Vaping status: Never Used  Substance Use Topics   Alcohol use: Yes    Comment: 2-4 glasses of wine 2-3 times weekly   Drug use: No   Allergies Patient has no known allergies.  Review of Systems Review of Systems  Gastrointestinal:  Positive for abdominal distention, abdominal pain, nausea and vomiting.    Physical Exam Vital Signs  I have reviewed the triage vital signs BP (!) 185/101 (BP Location: Right Arm)   Pulse (!) 117   Temp 97.7 F (36.5 C) (Oral)   Resp (!) 22   Ht 5' 11 (1.803 m)   SpO2 97%   BMI 23.71 kg/m   Physical Exam Constitutional:      General: He is not in acute distress.    Appearance: Normal appearance.  HENT:     Head: Normocephalic and atraumatic.     Nose: No congestion or rhinorrhea.  Eyes:     General:        Right eye: No discharge.        Left eye: No discharge.     Extraocular Movements: Extraocular movements intact.     Pupils: Pupils are equal, round, and reactive to light.  Cardiovascular:     Rate and Rhythm: Normal rate and regular rhythm.     Heart sounds: No murmur heard. Pulmonary:     Effort: No respiratory distress.     Breath sounds: No wheezing or rales.  Abdominal:      General: There is distension.     Tenderness: There is generalized abdominal tenderness.  Musculoskeletal:        General: Normal range of motion.     Cervical back: Normal range of motion.  Skin:    General: Skin is warm and dry.  Neurological:     General: No focal deficit present.     Mental Status: He is alert.     ED Results and Treatments Labs (all labs ordered are listed, but only abnormal results are displayed) Labs Reviewed  COMPREHENSIVE METABOLIC PANEL WITH GFR - Abnormal; Notable for the following components:      Result Value   Sodium 133 (*)    Potassium 5.5 (*)    Chloride 95 (*)  BUN 56 (*)    Creatinine, Ser 7.90 (*)    Calcium  8.3 (*)    Total Protein 8.3 (*)    Albumin  2.2 (*)    GFR, Estimated 7 (*)    All other components within normal limits  CBC - Abnormal; Notable for the following components:   WBC 1.7 (*)    Hemoglobin 12.4 (*)    RDW 17.3 (*)    Platelets 77 (*)    All other components within normal limits  BRAIN NATRIURETIC PEPTIDE - Abnormal; Notable for the following components:   B Natriuretic Peptide 1,756.7 (*)    All other components within normal limits  BODY FLUID CULTURE W GRAM STAIN  CULTURE, BLOOD (ROUTINE X 2)  CULTURE, BLOOD (ROUTINE X 2)  LIPASE, BLOOD  URINALYSIS, ROUTINE W REFLEX MICROSCOPIC  BODY FLUID CELL COUNT WITH DIFFERENTIAL  I-STAT CG4 LACTIC ACID, ED                                                                                                                          Radiology CT ABDOMEN PELVIS W CONTRAST Result Date: 12/14/2023 CLINICAL DATA:  Right lower quadrant abdominal pain for 2 days. Nausea and vomiting. Hemodialysis patient. EXAM: CT ABDOMEN AND PELVIS WITH CONTRAST TECHNIQUE: Multidetector CT imaging of the abdomen and pelvis was performed using the standard protocol following bolus administration of intravenous contrast. RADIATION DOSE REDUCTION: This exam was performed according to the departmental  dose-optimization program which includes automated exposure control, adjustment of the mA and/or kV according to patient size and/or use of iterative reconstruction technique. CONTRAST:  75mL OMNIPAQUE  IOHEXOL  350 MG/ML SOLN COMPARISON:  Abdominopelvic CT 08/04/2021 and 06/29/2021. FINDINGS: Lower chest: Interval improved aeration of the left lung base with mild residual atelectasis or scarring. No confluent airspace disease or significant pleural effusion. Moderate cardiomegaly without significant pericardial fluid. There is coronary artery atherosclerosis with calcifications of the mitral annulus. Hepatobiliary: The liver is normal in density without suspicious focal abnormality. No evidence of gallstones, gallbladder wall thickening or biliary dilatation. Pancreas: Unremarkable. No pancreatic ductal dilatation or surrounding inflammatory changes. Spleen: Normal in size without focal abnormality. Adrenals/Urinary Tract: Both adrenal glands appear normal. Moderate to severe renal cortical thinning and atrophy bilaterally, progressive from previous examination. No significant contrast excretion is demonstrated on the delayed images. There is no hydronephrosis or urinary tract calculus. The bladder appears unremarkable for its degree of distention. Stomach/Bowel: No enteric contrast administered. The stomach appears unremarkable for its degree of distention. Mild small bowel wall thickening with mucosal hyperenhancement in the mid abdomen. The appendix is not clearly seen on the current study, although there is no pericecal inflammation to suggest appendicitis. Mild diffuse colonic wall thickening. There are diverticular changes throughout the descending and sigmoid colon without focal surrounding inflammation. No evidence of bowel obstruction or perforation. Vascular/Lymphatic: Scattered prominent retroperitoneal lymph nodes, similar to previous study and likely reactive. Aortic and branch vessel atherosclerosis  without evidence of aneurysm or  large vessel occlusion. The portal, superior mesenteric and splenic veins are patent. Reproductive: The prostate gland and seminal vesicles appear unremarkable. Other: Moderate to large volume ascites again noted, similar in overall volume to the previous CT. There is apparent new diffuse peritoneal thickening without apparent nodularity. A large amount of fluid extends into the fall canal and screw, incompletely visualized but increased in volume from the previous study. No herniated bowel, organized fluid collection or pneumoperitoneum. Musculoskeletal: Chronic femoral head osteonecrosis bilaterally without subchondral collapse or significant secondary degenerative changes. Mild lumbar spondylosis. No acute osseous findings are identified. Generalized subcutaneous edema appears similar to previous CT. IMPRESSION: 1. Moderate to large volume ascites, similar in overall volume to the previous CT. There is new diffuse smooth peritoneal thickening without apparent nodularity, suspicious for peritonitis. Consider diagnostic paracentesis. 2. Mild small bowel and colonic wall thickening, nonspecific in the setting of ascites. No evidence of bowel obstruction or perforation. The appendix is not discretely visualized, although there are no specific signs of appendicitis. 3. Progressive renal cortical thinning and atrophy bilaterally. No hydronephrosis or urinary tract calculus. 4. Chronic femoral head osteonecrosis bilaterally without subchondral collapse or significant secondary degenerative changes. 5.  Aortic Atherosclerosis (ICD10-I70.0). Electronically Signed   By: Elsie Perone M.D.   On: 12/14/2023 14:56   DG Chest 2 View Result Date: 12/14/2023 CLINICAL DATA:  SHOB EXAM: CHEST - 2 VIEW COMPARISON:  June 29, 2021 FINDINGS: Lower lung volumes . Bilateral perihilar interstitial opacities. No pneumothorax. Trace left pleural effusion. Mild cardiomegaly. Tortuous aorta with aortic  atherosclerosis. No acute fracture or destructive lesions. Multilevel thoracic osteophytosis. IMPRESSION: Bilateral perihilar interstitial opacities, which may represent bronchovascular crowding due to low lung volumes, interstitial edema, or atypical/viral infection, in the correct clinical context. Electronically Signed   By: Rogelia Myers M.D.   On: 12/14/2023 13:09    Pertinent labs & imaging results that were available during my care of the patient were reviewed by me and considered in my medical decision making (see MDM for details).  Medications Ordered in ED Medications  lidocaine -EPINEPHrine  (XYLOCAINE  W/EPI) 2 %-1:200000 (PF) injection 10 mL (has no administration in time range)  morphine  (PF) 4 MG/ML injection 6 mg (has no administration in time range)  ondansetron  (ZOFRAN -ODT) disintegrating tablet 4 mg (4 mg Oral Given 12/14/23 1221)  morphine  (PF) 2 MG/ML injection 2 mg (2 mg Intravenous Given 12/14/23 1222)  iohexol  (OMNIPAQUE ) 350 MG/ML injection 75 mL (75 mLs Intravenous Contrast Given 12/14/23 1416)                                                                                                                                     Procedures Paracentesis  Date/Time: 12/14/2023 3:57 PM  Performed by: Albertina Dixon, MD Authorized by: Albertina Dixon, MD   Consent:    Consent obtained:  Verbal   Consent given by:  Patient   Risks discussed:  Bleeding, bowel perforation,  infection and pain   Alternatives discussed:  No treatment, delayed treatment, alternative treatment, observation and referral Universal protocol:    Procedure explained and questions answered to patient or proxy's satisfaction: yes     Test results available: yes     Imaging studies available: yes     Site/side marked: yes     Patient identity confirmed:  Verbally with patient and arm band Pre-procedure details:    Procedure purpose:  Diagnostic   Preparation: Patient was prepped and draped in usual  sterile fashion   Anesthesia:    Anesthesia method:  Local infiltration   Local anesthetic:  Lidocaine  2% WITH epi Procedure details:    Needle gauge:  18   Ultrasound guidance: yes     Puncture site:  L lower quadrant   Fluid removed amount:  50 cc   Fluid appearance:  Cloudy, bloody and amber   Dressing:  4x4 sterile gauze and adhesive bandage Post-procedure details:    Procedure completion:  Tolerated well, no immediate complications   (including critical care time)  Medical Decision Making / ED Course   This patient presents to the ED for concern of abdominal pain nausea vomiting, this involves an extensive number of treatment options, and is a complaint that carries with it a high risk of complications and morbidity.  The differential diagnosis includes SBP, intra-abdominal infection, appendicitis, obstruction, gastroenteritis  MDM: Patient seen emergency department for evaluation of abdominal pain nausea and vomiting.  Patient arrives tachycardic and physical exam with significant abdominal distention, some bruising on the umbilicus that the patient is unaware as to where this came from and severe generalized tenderness in the abdomen.  Laboratory evaluation with a leukopenia to 1.7, anemia to 12.4, platelet count 77 and pancytopenia appears to be patient's baseline but thrombocytopenia is mildly worse than baseline.  Sodium 133, potassium 5.5, BUN 56, creatinine 7.9, albumin  2.2, BNP 1756.7. CT on pelvis concerning for moderate to large volume ascites with diffuse smooth peritoneal thickening concerning for SBP.  Chronic femoral head osteonecrosis.  Chest x-ray with bilateral perihilar interstitial opacities.  Given concern for SBP with leukopenia and tachycardia, patient does meet SIRS criteria and blood cultures obtained but I am hesitant to pursue aggressive fluid resuscitation given the patient is clinically fluid overloaded and already.  Also as patient warrants antibiotic therapy  and we do have concern for SBP I performed a diagnostic paracentesis removing 50 cc of cloudy amber-colored fluid and sent this off prior to antibiotic administration.  I spoke with the nephrologist on-call Dr. Vassie who will evaluate the patient for routine dialysis.  Also placed an IR paracentesis order for therapeutic paracentesis.  Patient require hospital admission.  Patient admitted   Additional history obtained:  -External records from outside source obtained and reviewed including: Chart review including previous notes, labs, imaging, consultation notes   Lab Tests: -I ordered, reviewed, and interpreted labs.   The pertinent results include:   Labs Reviewed  COMPREHENSIVE METABOLIC PANEL WITH GFR - Abnormal; Notable for the following components:      Result Value   Sodium 133 (*)    Potassium 5.5 (*)    Chloride 95 (*)    BUN 56 (*)    Creatinine, Ser 7.90 (*)    Calcium  8.3 (*)    Total Protein 8.3 (*)    Albumin  2.2 (*)    GFR, Estimated 7 (*)    All other components within normal limits  CBC - Abnormal; Notable for the  following components:   WBC 1.7 (*)    Hemoglobin 12.4 (*)    RDW 17.3 (*)    Platelets 77 (*)    All other components within normal limits  BRAIN NATRIURETIC PEPTIDE - Abnormal; Notable for the following components:   B Natriuretic Peptide 1,756.7 (*)    All other components within normal limits  BODY FLUID CULTURE W GRAM STAIN  CULTURE, BLOOD (ROUTINE X 2)  CULTURE, BLOOD (ROUTINE X 2)  LIPASE, BLOOD  URINALYSIS, ROUTINE W REFLEX MICROSCOPIC  BODY FLUID CELL COUNT WITH DIFFERENTIAL  I-STAT CG4 LACTIC ACID, ED      EKG   EKG Interpretation Date/Time:  Tuesday December 14 2023 12:14:35 EDT Ventricular Rate:  118 PR Interval:  162 QRS Duration:  82 QT Interval:  342 QTC Calculation: 479 R Axis:   -36  Text Interpretation: Sinus tachycardia Left ventricular hypertrophy ( R in aVL , Cornell product ) Abnormal ECG When compared with ECG of  28-Jun-2021 23:16, PREVIOUS ECG IS PRESENT Confirmed by Treylen Gibbs ([id number]) on 12/14/2023 4:08:08 PM         Imaging Studies ordered: I ordered imaging studies including chest x-ray, CT abdomen pelvis I independently visualized and interpreted imaging. I agree with the radiologist interpretation   Medicines ordered and prescription drug management: Meds ordered this encounter  Medications   ondansetron  (ZOFRAN -ODT) disintegrating tablet 4 mg   morphine  (PF) 2 MG/ML injection 2 mg   iohexol  (OMNIPAQUE ) 350 MG/ML injection 75 mL   lidocaine -EPINEPHrine  (XYLOCAINE  W/EPI) 2 %-1:200000 (PF) injection 10 mL   morphine  (PF) 4 MG/ML injection 6 mg    -I have reviewed the patients home medicines and have made adjustments as needed  Critical interventions none  Consultations Obtained: I requested consultation with the nephrologist on-call Dr. Susannah,  and discussed lab and imaging findings as well as pertinent plan - they recommend: inpatient eval   Cardiac Monitoring: The patient was maintained on a cardiac monitor.  I personally viewed and interpreted the cardiac monitored which showed an underlying rhythm of: sinus tachycardia  Social Determinants of Health:  Factors impacting patients care include: none   Reevaluation: After the interventions noted above, I reevaluated the patient and found that they have :improved  Co morbidities that complicate the patient evaluation  Past Medical History:  Diagnosis Date   Chronic kidney disease    Lupus    Thyroid  disease       Dispostion: I considered admission for this patient, and patient will require hospital admission for concern for SBP     Final Clinical Impression(s) / ED Diagnoses Final diagnoses:  None     @PCDICTATION @    Albertina Dixon, MD 12/14/23 1610

## 2023-12-14 NOTE — Hospital Course (Addendum)
 53yo with h/o ESRD on TTS HD, SLE, ascites without diagnosis of cirrhosis, and chronic HFrEF who presented on 8/19 with abdominal pain.  Last paracentesis was in 09/2023.  Bedside paracentesis performed with high concern for SBP, cultures positive for Klebsiella, treated with Ceftriaxone  -> Cefazolin  x 14 days.  IR performed paracentesis on 8/20 with 1.8L removed and repeat on 8/26 with 700 cc removed; etiology is thought to be related to severely low albumin .  Developed severe acute metabolic encephalopathy from alcohol withdrawal which has since improved.  Repeat paracentesis ordered.  GI is consulting.  ID is consulting, recommending large volume paracentesis with bulk of fluid to be sent for TB analysis.  Recommended for [facility] therapy at time of dc.

## 2023-12-14 NOTE — Assessment & Plan Note (Addendum)
 12-14-2023 continue coreg  with hold orders. Update echo.

## 2023-12-14 NOTE — Assessment & Plan Note (Addendum)
 12-14-2023 admit for SBP. Continue IV Rocephin /flagyl . Blood cx sent. Paracentesis fluid culture sent. Awaiting for IR to perform large volume paracentesis. Given that pt is already ESRD on HD, not sure he needs IV albumin  to prevent hepatorenal syndrome. He could get IV albumin  if he gets hypotensive from large volume paracentesis. Pt can have clear liquid diet after IR paracentesis. Prn IV dilaudid  0.5 mg q2h prn for abd pain.

## 2023-12-14 NOTE — Plan of Care (Signed)
   Problem: Education: Goal: Knowledge of General Education information will improve Description: Including pain rating scale, medication(s)/side effects and non-pharmacologic comfort measures Outcome: Progressing   Problem: Clinical Measurements: Goal: Respiratory complications will improve Outcome: Progressing

## 2023-12-14 NOTE — Progress Notes (Signed)
  X-cover Note: cell count on paracentesis fluid consistent with spontaneous bacterial peritonitis. on appropriate iv abx for now. will await fluid and blood cultures.    Camellia Door, DO [facility]

## 2023-12-14 NOTE — ED Notes (Signed)
 Pt is in xray

## 2023-12-14 NOTE — ED Provider Triage Note (Signed)
 Emergency Medicine Provider Triage Evaluation Note  [name] , a 53 y.o. male  was evaluated in triage.  Pt complains of RLQ abdominal pain consistent since yesterday, was supposed to go to dialysis today but came here instead. Finished full course of HD on Saturday. Last bowel movement yesterday.   Endorses n/v starting this morning x 3 episodes (nonbloody), also endorsing body aches, chills, chronic shortness of breath (not worse than normal), chronic leg swelling (not worse than normal).  Denies fevers, chest pain, cough, congestion, diarrhea, hematochezia, melena.   Review of Systems  Positive: N/a Negative: N/a  Physical Exam  BP (!) 181/97   Pulse (!) 115   Temp 98.3 F (36.8 C)   Resp 17   Ht 5' 11 (1.803 m)   SpO2 99%   BMI 23.71 kg/m  Gen:   Awake, no distress   Resp:  Normal effort  MSK:   Moves extremities without difficulty  Other:    Medical Decision Making  Medically screening exam initiated at 12:03 PM.  Appropriate orders placed.  [name] was informed that the remainder of the evaluation will be completed by another provider, this initial triage assessment does not replace that evaluation, and the importance of remaining in the ED until their evaluation is complete.     [name], [place] 12/14/23 1208

## 2023-12-14 NOTE — Assessment & Plan Note (Addendum)
 12-14-2023 last echo 12-2021. Will update echo. Fluid management with HD by nephrology. Continue coreg  with hold orders.

## 2023-12-14 NOTE — Assessment & Plan Note (Addendum)
 12-14-2023 EDP has requested IR paracentesis. Please send for fluid cytology, fluid albumin , fluid protein.

## 2023-12-14 NOTE — Assessment & Plan Note (Addendum)
 12-14-2023 stable. Hold plaquenil  for now given SBP

## 2023-12-14 NOTE — Assessment & Plan Note (Addendum)
 12-14-2023 continue synthroid .

## 2023-12-14 NOTE — Assessment & Plan Note (Addendum)
 12-14-2023 nephrology consulted by EDP for HD.

## 2023-12-14 NOTE — Assessment & Plan Note (Addendum)
 12-14-2023 chronic. SCDs for DVT prophylaxis.

## 2023-12-14 NOTE — ED Triage Notes (Addendum)
 Pt here for lower abd pain that started 2 days ago. C/O n/v. Denies diarrhea/ CP/ SHOB. Last full dialysis tx was Saturday.

## 2023-12-14 NOTE — H&P (Addendum)
 History and Physical    [name] FMW:[id number] DOB: 1971/03/13 DOA: 12/14/2023  DOS: the patient was seen and examined on 12/14/2023  PCP: Berneta Elsie Sayre, MD   Patient coming from: Home  I have personally briefly reviewed patient's old medical records in [facility]  CC: abd pain HPI: 53 year old male with a history of end-stage renal disease on hemodialysis Tuesday Thursday Saturday, history of lupus, history of ascites without a diagnosis of cirrhosis, history of systolic congestive heart EF of 35 to 40% who presents to the ER with a sudden onset of abdominal pain that started last night.  This happened yesterday evening.  It was in both his lower quadrants.  No fevers.  His last paracentesis was in early June 2025.  He has not had any fevers, diarrhea.  He missed his hemodialysis session yesterday due to feeling poorly..  On arrival temp 98.3 heart rate 115 blood pressure 181/97.  Labs showed a lactic acid of 2.0, white count 1.7, hemoglobin 12.4, platelets of 77  Sodium 133, potassium 5.5, chloride of 95, bicarb 24, BUN of 56, creatinine 7.9, calcium  8.3  BNP elevated to 1756, lipase normal at 31  I spoke personally to the ER physician who had performed a bedside needle paracentesis who reported that the ascitic fluid removed was cloudy and amber.  High concern for spontaneous bacterial peritonitis.  Cultures were sent from the ascitic fluid along with blood cultures.  IR has been consulted for large-volume paracentesis.  Nephrology also consulted since the patient missed hemodialysis on Tuesday.  [facility] hospitalist consulted for admission.  Significant Events: Admitted 12/14/2023 for presumed SBP   Admission Labs: WBC 1.7, HgB 12.4, plt 77 Lactic acid 2.0 Lipase 31 BNP 1756 Na 133, K 5.5, CO2 of 24, BUN 56, scr 7.9 T prot 8.3, alb 2.2, AST 34, ALT 21, alk phos 68, T. Bili 1.0  Admission Imaging Studies: CXR Bilateral perihilar interstitial opacities,  which may represent bronchovascular crowding due to low lung volumes, interstitial edema, or atypical/viral infection, in the correct clinical context. CT abd/pelvis 1. Moderate to large volume ascites, similar in overall volume to the previous CT. There is new diffuse smooth peritoneal thickening without apparent nodularity, suspicious for peritonitis. Consider diagnostic paracentesis. 2. Mild small bowel and colonic wall thickening, nonspecific in the setting of ascites. No evidence of bowel obstruction or perforation. The appendix is not discretely visualized, although there are no specific signs of appendicitis. 3. Progressive renal cortical thinning and atrophy bilaterally. No hydronephrosis or urinary tract calculus. 4. Chronic femoral head osteonecrosis bilaterally without subchondral collapse or significant secondary degenerative changes. 5.  Aortic Atherosclerosis  Significant Labs:   Significant Imaging Studies:   Antibiotic Therapy: Anti-infectives (From admission, onward)    Start     Dose/Rate Route Frequency Ordered Stop   12/14/23 1545  cefTRIAXone  (ROCEPHIN ) 2 g in sodium chloride  0.9 % 100 mL IVPB        2 g 200 mL/hr over 30 Minutes Intravenous  Once 12/14/23 1534 12/14/23 1649       Procedures: ER has ordered IR paracentesis  Consultants: nephrology    ED Course: EDP performed bedside paracentesis for ascitic fluid sample for analysis. Blood cx and ascitic fluid cx sent. Started on IV Rocephin .  Review of Systems:  Review of Systems  Constitutional:  Negative for chills and fever.  HENT: Negative.    Eyes: Negative.   Respiratory: Negative.    Cardiovascular: Negative.   Gastrointestinal:  Positive for  abdominal pain.  Genitourinary: Negative.   Musculoskeletal: Negative.   Skin:  Positive for rash.  Neurological: Negative.   Endo/Heme/Allergies:  Bruises/bleeds easily.  Psychiatric/Behavioral: Negative.    All other systems reviewed and are  negative.   Past Medical History:  Diagnosis Date   ESRD on hemodialysis (HCC)    TTS at [facility])   Lupus    Thyroid  disease     Past Surgical History:  Procedure Laterality Date   AV FISTULA PLACEMENT Left 08/26/2020   Procedure: LEFT RADIOCEPHALIC ARTERIOVENOUS (AV) FISTULA CREATION;  Surgeon: Eliza Lonni RAMAN, MD;  Location: [facility] OR;  Service: Vascular;  Laterality: Left;   IR FLUORO GUIDE CV LINE RIGHT  04/30/2020   IR PARACENTESIS  05/07/2020   IR PARACENTESIS  05/10/2020   IR PARACENTESIS  06/03/2020   IR PARACENTESIS  06/24/2020   IR PARACENTESIS  07/15/2020   IR PARACENTESIS  07/29/2020   IR PARACENTESIS  08/14/2020   IR PARACENTESIS  06/11/2021   IR PARACENTESIS  08/08/2021   IR PARACENTESIS  09/12/2021   IR PARACENTESIS  10/10/2021   IR PARACENTESIS  03/09/2022   IR PARACENTESIS  07/28/2023   IR PARACENTESIS  09/03/2023   IR PARACENTESIS  10/01/2023   IR PERC TUN PERIT CATH WO PORT S&I /IMAG  05/03/2020   IR US  GUIDE VASC ACCESS RIGHT  04/30/2020   IR US  GUIDE VASC ACCESS RIGHT  05/03/2020     reports that he has never smoked. He has never used smokeless tobacco. He reports current alcohol use. He reports that he does not use drugs.  No Known Allergies  Family History  Problem Relation Age of Onset   Cancer Mother     Prior to Admission medications   Medication Sig Start Date End Date Taking? Authorizing Provider  aspirin  EC 81 MG tablet Take 1 tablet (81 mg total) by mouth daily. Swallow whole. 04/13/22   Vannie Reche RAMAN, NP  carvedilol  (COREG ) 25 MG tablet Take 1 tablet (25 mg total) by mouth 2 (two) times daily. 04/13/22 04/08/23  Walker, Caitlin S, NP  hydrALAZINE (APRESOLINE) 25 MG tablet Take 25 mg by mouth 2 (two) times daily. Patient not taking: Reported on 06/05/2022 04/23/22   [provider]  hydroxychloroquine  (PLAQUENIL ) 200 MG tablet Take 200 mg by mouth 3 (three) times a week. No set days 01/16/21   [provider]  ibuprofen  (ADVIL )  200 MG tablet Take 200 mg by mouth every 6 (six) hours as needed for moderate pain (pain score 4-6).    [provider]  irbesartan  (AVAPRO ) 300 MG tablet Take 1 tablet (300 mg total) by mouth daily. 01/07/22   Miriam Norris, NP  levothyroxine  (SYNTHROID ) 50 MCG tablet Take 1 tablet (50 mcg total) by mouth daily before breakfast. 12/01/21   Berneta Elsie Sayre, MD  rosuvastatin  (CRESTOR ) 5 MG tablet Take 1 tablet (5 mg total) by mouth 3 (three) times a week. 04/13/22 04/21/23  Vannie Reche RAMAN, NP    Physical Exam: Vitals:   12/14/23 1147 12/14/23 1148 12/14/23 1430 12/14/23 1500  BP:  (!) 181/97 (!) 185/101 (!) 153/92  Pulse:  (!) 115 (!) 117 (!) 112  Resp:  17 (!) 22 20  Temp:  98.3 F (36.8 C) 97.7 F (36.5 C) 98 F (36.7 C)  TempSrc:   Oral Oral  SpO2:  99% 97% 100%  Height: 5' 11 (1.803 m)       Physical Exam Vitals and nursing note reviewed.  Constitutional:      General: He is not in acute distress.    Appearance: He is ill-appearing.     Comments: Chronically ill appearing  HENT:     Head: Normocephalic and atraumatic.  Cardiovascular:     Rate and Rhythm: Regular rhythm. Tachycardia present.  Pulmonary:     Effort: Pulmonary effort is normal. No respiratory distress.     Breath sounds: Normal breath sounds.  Abdominal:     General: Abdomen is protuberant.     Palpations: There is shifting dullness and fluid wave.     Tenderness: There is abdominal tenderness. There is guarding.  Musculoskeletal:     Right lower leg: Edema present.     Left lower leg: Edema present.  Skin:    General: Skin is warm and dry.     Capillary Refill: Capillary refill takes less than 2 seconds.     Comments: Lots of excoriation on his face, legs and arms.  Neurological:     Mental Status: He is alert and oriented to person, place, and time.     Labs on Admission: I have personally reviewed following labs and imaging studies  CBC: Recent Labs  Lab 12/14/23 1150  WBC  1.7*  HGB 12.4*  HCT 39.2  MCV 89.7  PLT 77*   Basic Metabolic Panel: Recent Labs  Lab 12/14/23 1150  NA 133*  K 5.5*  CL 95*  CO2 24  GLUCOSE 85  BUN 56*  CREATININE 7.90*  CALCIUM  8.3*   GFR: CrCl cannot be calculated (Unknown ideal weight.). Liver Function Tests: Recent Labs  Lab 12/14/23 1150  AST 34  ALT 21  ALKPHOS 68  BILITOT 1.0  PROT 8.3*  ALBUMIN  2.2*   Recent Labs  Lab 12/14/23 1150  LIPASE 31   BNP (last 3 results) Recent Labs    12/14/23 1150  BNP 1,756.7*   Radiological Exams on Admission: I have personally reviewed images CT ABDOMEN PELVIS W CONTRAST Result Date: 12/14/2023 CLINICAL DATA:  Right lower quadrant abdominal pain for 2 days. Nausea and vomiting. Hemodialysis patient. EXAM: CT ABDOMEN AND PELVIS WITH CONTRAST TECHNIQUE: Multidetector CT imaging of the abdomen and pelvis was performed using the standard protocol following bolus administration of intravenous contrast. RADIATION DOSE REDUCTION: This exam was performed according to the departmental dose-optimization program which includes automated exposure control, adjustment of the mA and/or kV according to patient size and/or use of iterative reconstruction technique. CONTRAST:  75mL OMNIPAQUE  IOHEXOL  350 MG/ML SOLN COMPARISON:  Abdominopelvic CT 08/04/2021 and 06/29/2021. FINDINGS: Lower chest: Interval improved aeration of the left lung base with mild residual atelectasis or scarring. No confluent airspace disease or significant pleural effusion. Moderate cardiomegaly without significant pericardial fluid. There is coronary artery atherosclerosis with calcifications of the mitral annulus. Hepatobiliary: The liver is normal in density without suspicious focal abnormality. No evidence of gallstones, gallbladder wall thickening or biliary dilatation. Pancreas: Unremarkable. No pancreatic ductal dilatation or surrounding inflammatory changes. Spleen: Normal in size without focal abnormality.  Adrenals/Urinary Tract: Both adrenal glands appear normal. Moderate to severe renal cortical thinning and atrophy bilaterally, progressive from previous examination. No significant contrast excretion is demonstrated on the delayed images. There is no hydronephrosis or urinary tract calculus. The bladder appears unremarkable for its degree of distention. Stomach/Bowel: No enteric contrast administered. The stomach appears unremarkable for its degree of distention. Mild small bowel wall thickening with mucosal hyperenhancement in the mid abdomen. The appendix is not clearly seen on the current study, although  there is no pericecal inflammation to suggest appendicitis. Mild diffuse colonic wall thickening. There are diverticular changes throughout the descending and sigmoid colon without focal surrounding inflammation. No evidence of bowel obstruction or perforation. Vascular/Lymphatic: Scattered prominent retroperitoneal lymph nodes, similar to previous study and likely reactive. Aortic and branch vessel atherosclerosis without evidence of aneurysm or large vessel occlusion. The portal, superior mesenteric and splenic veins are patent. Reproductive: The prostate gland and seminal vesicles appear unremarkable. Other: Moderate to large volume ascites again noted, similar in overall volume to the previous CT. There is apparent new diffuse peritoneal thickening without apparent nodularity. A large amount of fluid extends into the fall canal and screw, incompletely visualized but increased in volume from the previous study. No herniated bowel, organized fluid collection or pneumoperitoneum. Musculoskeletal: Chronic femoral head osteonecrosis bilaterally without subchondral collapse or significant secondary degenerative changes. Mild lumbar spondylosis. No acute osseous findings are identified. Generalized subcutaneous edema appears similar to previous CT. IMPRESSION: 1. Moderate to large volume ascites, similar in overall  volume to the previous CT. There is new diffuse smooth peritoneal thickening without apparent nodularity, suspicious for peritonitis. Consider diagnostic paracentesis. 2. Mild small bowel and colonic wall thickening, nonspecific in the setting of ascites. No evidence of bowel obstruction or perforation. The appendix is not discretely visualized, although there are no specific signs of appendicitis. 3. Progressive renal cortical thinning and atrophy bilaterally. No hydronephrosis or urinary tract calculus. 4. Chronic femoral head osteonecrosis bilaterally without subchondral collapse or significant secondary degenerative changes. 5.  Aortic Atherosclerosis (ICD10-I70.0). Electronically Signed   By: Elsie Perone M.D.   On: 12/14/2023 14:56   DG Chest 2 View Result Date: 12/14/2023 CLINICAL DATA:  SHOB EXAM: CHEST - 2 VIEW COMPARISON:  June 29, 2021 FINDINGS: Lower lung volumes . Bilateral perihilar interstitial opacities. No pneumothorax. Trace left pleural effusion. Mild cardiomegaly. Tortuous aorta with aortic atherosclerosis. No acute fracture or destructive lesions. Multilevel thoracic osteophytosis. IMPRESSION: Bilateral perihilar interstitial opacities, which may represent bronchovascular crowding due to low lung volumes, interstitial edema, or atypical/viral infection, in the correct clinical context. Electronically Signed   By: Rogelia Myers M.D.   On: 12/14/2023 13:09    EKG: My personal interpretation of EKG shows: sinus tachycardia   Assessment/Plan Principal Problem:   Spontaneous bacterial peritonitis (HCC) Active Problems:   ESRD on dialysis (HCC)   Ascites   Systemic lupus erythematosus with lung involvement (HCC)   Pancytopenia (HCC)   Chronic systolic CHF (congestive heart failure) (HCC)   Acquired hypothyroidism   Essential hypertension   Hyperkalemia   Assessment and Plan: * Spontaneous bacterial peritonitis (HCC) 12-14-2023 admit for SBP. Continue IV Rocephin /flagyl .  Blood cx sent. Paracentesis fluid culture sent. Awaiting for IR to perform large volume paracentesis. Given that pt is already ESRD on HD, not sure he needs IV albumin  to prevent hepatorenal syndrome. He could get IV albumin  if he gets hypotensive from large volume paracentesis. Pt can have clear liquid diet after IR paracentesis. Prn IV dilaudid  0.5 mg q2h prn for abd pain.  Ascites 12-14-2023 EDP has requested IR paracentesis. Please send for fluid cytology, fluid albumin , fluid protein.  ESRD on dialysis [facility]) 12-14-2023 nephrology consulted by EDP for HD.  Essential hypertension 12-14-2023 continue coreg  with hold orders. Update echo.  Acquired hypothyroidism 12-14-2023 continue synthroid .  Chronic systolic CHF (congestive heart failure) (HCC) 12-14-2023 last echo 12-2021. Will update echo. Fluid management with HD by nephrology. Continue coreg  with hold orders.  Pancytopenia (HCC) 12-14-2023 chronic. SCDs  for DVT prophylaxis.  Systemic lupus erythematosus with lung involvement (HCC) 12-14-2023 stable. Hold plaquenil  for now given SBP  Hyperkalemia 12-14-2023 1 dose of po lokelma    DVT prophylaxis: SCDs Code Status: Full Code Family Communication: no family at bedside. He has made his sister [name] his surrogate decision maker Disposition Plan: home  Consults called: nephrology  Admission status: Inpatient, Telemetry bed   Camellia Door, DO [facility] 12/14/2023, 5:39 PM

## 2023-12-14 NOTE — Consult Note (Signed)
 Renal Service Consult Note [facility] Angel JONETTA Fret, MD  Patient: [name] Date: 12/14/2023 Requesting Physician: Dr. Albertina  Reason for Consult: ESRD pt w/ abd pain  HPI: The patient is a 53 y.o. year-old w/ PMH as below who presented to ED with abdominal pain that started 2 days ago.  Also nausea and vomiting.  No diarrhea no shortness of breath or chest pain.  Last dialysis was Saturday.  In ED blood pressure 180/97,  HR 115, RR 17-20, temp 98, 100% O2 sat on room air.  K+ 5.5, BUN 56, creatinine 7.9.  BNP 1750.  WBC 1.7K, Hgb 12.  Platelets 77.  CXR showed no active disease.  CT abdomen showed new diffuse peritoneal thickening suspicious for peritonitis. CT also showed mild small bowel and colonic wall thickening, nonspecific in setting of ascites. Pt is to be admitted. We are asked to see for ESRD.    Pt seen in ED room. Asking for something to drink. No c/o's today. Mild ankle edema bilaterally. No SOB or cough.   ROS - denies CP, no joint pain, no HA, no blurry vision, no rash, no diarrhea, no nausea/ vomiting   Past Medical History  Past Medical History:  Diagnosis Date   Chronic kidney disease    Lupus    Thyroid  disease    Past Surgical History  Past Surgical History:  Procedure Laterality Date   AV FISTULA PLACEMENT Left 08/26/2020   Procedure: LEFT RADIOCEPHALIC ARTERIOVENOUS (AV) FISTULA CREATION;  Surgeon: Eliza Lonni RAMAN, MD;  Location: [facility] OR;  Service: Vascular;  Laterality: Left;   IR FLUORO GUIDE CV LINE RIGHT  04/30/2020   IR PARACENTESIS  05/07/2020   IR PARACENTESIS  05/10/2020   IR PARACENTESIS  06/03/2020   IR PARACENTESIS  06/24/2020   IR PARACENTESIS  07/15/2020   IR PARACENTESIS  07/29/2020   IR PARACENTESIS  08/14/2020   IR PARACENTESIS  06/11/2021   IR PARACENTESIS  08/08/2021   IR PARACENTESIS  09/12/2021   IR PARACENTESIS  10/10/2021   IR PARACENTESIS  03/09/2022   IR PARACENTESIS  07/28/2023   IR PARACENTESIS  09/03/2023   IR  PARACENTESIS  10/01/2023   IR PERC TUN PERIT CATH WO PORT S&I /IMAG  05/03/2020   IR US  GUIDE VASC ACCESS RIGHT  04/30/2020   IR US  GUIDE VASC ACCESS RIGHT  05/03/2020   Family History  Family History  Problem Relation Age of Onset   Cancer Mother    Social History  reports that he has never smoked. He has never used smokeless tobacco. He reports current alcohol use. He reports that he does not use drugs. Allergies No Known Allergies Home medications Prior to Admission medications   Medication Sig Start Date End Date Taking? Authorizing Provider  aspirin  EC 81 MG tablet Take 1 tablet (81 mg total) by mouth daily. Swallow whole. 04/13/22   Vannie Reche RAMAN, NP  carvedilol  (COREG ) 25 MG tablet Take 1 tablet (25 mg total) by mouth 2 (two) times daily. 04/13/22 04/08/23  Walker, Caitlin S, NP  hydrALAZINE (APRESOLINE) 25 MG tablet Take 25 mg by mouth 2 (two) times daily. Patient not taking: Reported on 06/05/2022 04/23/22   [provider]  hydroxychloroquine  (PLAQUENIL ) 200 MG tablet Take 200 mg by mouth 3 (three) times a week. No set days 01/16/21   [provider]  ibuprofen  (ADVIL ) 200 MG tablet Take 200 mg by mouth every 6 (six) hours as needed for moderate pain (pain score  4-6).    [provider]  irbesartan  (AVAPRO ) 300 MG tablet Take 1 tablet (300 mg total) by mouth daily. 01/07/22   Miriam Norris, NP  levothyroxine  (SYNTHROID ) 50 MCG tablet Take 1 tablet (50 mcg total) by mouth daily before breakfast. 12/01/21   Berneta Elsie Sayre, MD  rosuvastatin  (CRESTOR ) 5 MG tablet Take 1 tablet (5 mg total) by mouth 3 (three) times a week. 04/13/22 04/21/23  Vannie Reche RAMAN, NP     Vitals:   12/14/23 1147 12/14/23 1148 12/14/23 1430 12/14/23 1500  BP:  (!) 181/97 (!) 185/101 (!) 153/92  Pulse:  (!) 115 (!) 117 (!) 112  Resp:  17 (!) 22 20  Temp:  98.3 F (36.8 C) 97.7 F (36.5 C) 98 F (36.7 C)  TempSrc:   Oral Oral  SpO2:  99% 97% 100%  Height: 5' 11 (1.803 m)       Exam Gen alert, no distress No rash, cyanosis or gangrene Sclera anicteric, throat clear  No jvd or bruits Chest clear bilat to bases, no rales/ wheezing RRR no MRG Abd 2+ ascites, minimally tender GU deferred MS no joint effusions or deformity Ext 1+ bilat pretib edema, no other edema Neuro is alert, Ox 3 , nf, no asterixis    L AVF+bruit   Home bp meds: Coreg  25 bid Hydralazine 25 bid (not taking) Irbesartan  300 mg every day    OP HD: SW TTS 3h  B400  71kg   2K bath  AVF   Heparin  none  Labs -. Na 133, K= 5.5, BUN 56, creat 7.9, alb 2.2, wbc 1.7K, Hb 12  Assessment/ Plan: Abdominal pain: w/u in progress per ED, admitting team. CT showed findings suspicious for SBP. Per pmd.  ESRD: on HD TTS. Last HD Sat. HD tonight / tomorrow.  HTN: is on 2 bp lowering meds at home, BP's up a bit here. Follow. Cont home meds.  Volume: CXR clear, no congestion/ edema. Has been having N/V for 2 days, suspect euvolemic, possibly dry.  Anemia of esrd: Hb 12, no esa needs. Follow.  Recurrent ascites: has been getting paracenteses since 2022       Myer Fret  MD [facility] 12/14/2023, 3:58 PM  Recent Labs  Lab 12/14/23 1150  HGB 12.4*  ALBUMIN  2.2*  CALCIUM  8.3*  CREATININE 7.90*  K 5.5*   Inpatient medications:  lidocaine -EPINEPHrine   10 mL Intradermal Once    cefTRIAXone  (ROCEPHIN )  IV 2 g (12/14/23 1548)

## 2023-12-14 NOTE — Assessment & Plan Note (Signed)
 12-14-2023 1 dose of po lokelma 

## 2023-12-15 ENCOUNTER — Inpatient Hospital Stay (HOSPITAL_COMMUNITY)

## 2023-12-15 DIAGNOSIS — R188 Other ascites: Secondary | ICD-10-CM | POA: Diagnosis not present

## 2023-12-15 DIAGNOSIS — I5021 Acute systolic (congestive) heart failure: Secondary | ICD-10-CM | POA: Diagnosis not present

## 2023-12-15 DIAGNOSIS — K652 Spontaneous bacterial peritonitis: Secondary | ICD-10-CM | POA: Diagnosis not present

## 2023-12-15 DIAGNOSIS — N186 End stage renal disease: Secondary | ICD-10-CM | POA: Diagnosis not present

## 2023-12-15 DIAGNOSIS — D61818 Other pancytopenia: Secondary | ICD-10-CM

## 2023-12-15 DIAGNOSIS — I5022 Chronic systolic (congestive) heart failure: Secondary | ICD-10-CM | POA: Diagnosis not present

## 2023-12-15 HISTORY — PX: IR PARACENTESIS: IMG2679

## 2023-12-15 LAB — COMPREHENSIVE METABOLIC PANEL WITH GFR
ALT: 20 U/L (ref 0–44)
AST: 43 U/L — ABNORMAL HIGH (ref 15–41)
Albumin: 1.7 g/dL — ABNORMAL LOW (ref 3.5–5.0)
Alkaline Phosphatase: 47 U/L (ref 38–126)
Anion gap: 15 (ref 5–15)
BUN: 36 mg/dL — ABNORMAL HIGH (ref 6–20)
CO2: 23 mmol/L (ref 22–32)
Calcium: 7.5 mg/dL — ABNORMAL LOW (ref 8.9–10.3)
Chloride: 94 mmol/L — ABNORMAL LOW (ref 98–111)
Creatinine, Ser: 5.94 mg/dL — ABNORMAL HIGH (ref 0.61–1.24)
GFR, Estimated: 11 mL/min — ABNORMAL LOW (ref >60)
Glucose, Bld: 51 mg/dL — ABNORMAL LOW (ref 70–99)
Potassium: 4.6 mmol/L (ref 3.5–5.1)
Sodium: 132 mmol/L — ABNORMAL LOW (ref 135–145)
Total Bilirubin: 0.5 mg/dL (ref 0.0–1.2)
Total Protein: 6.5 g/dL (ref 6.5–8.1)

## 2023-12-15 LAB — HEPATITIS PANEL, ACUTE
HCV Ab: NONREACTIVE
Hep A IgM: NONREACTIVE
Hep B C IgM: NONREACTIVE
Hepatitis B Surface Ag: NONREACTIVE

## 2023-12-15 LAB — CBC WITH DIFFERENTIAL/PLATELET
Abs Immature Granulocytes: 0.03 K/uL (ref 0.00–0.07)
Basophils Absolute: 0 K/uL (ref 0.0–0.1)
Basophils Relative: 0 %
Eosinophils Absolute: 0 K/uL (ref 0.0–0.5)
Eosinophils Relative: 0 %
HCT: 34.8 % — ABNORMAL LOW (ref 39.0–52.0)
Hemoglobin: 11.1 g/dL — ABNORMAL LOW (ref 13.0–17.0)
Immature Granulocytes: 1 %
Lymphocytes Relative: 4 %
Lymphs Abs: 0.2 K/uL — ABNORMAL LOW (ref 0.7–4.0)
MCH: 28.2 pg (ref 26.0–34.0)
MCHC: 31.9 g/dL (ref 30.0–36.0)
MCV: 88.5 fL (ref 80.0–100.0)
Monocytes Absolute: 0.4 K/uL (ref 0.1–1.0)
Monocytes Relative: 9 %
Neutro Abs: 3.4 K/uL (ref 1.7–7.7)
Neutrophils Relative %: 86 %
Platelets: 64 K/uL — ABNORMAL LOW (ref 150–400)
RBC: 3.93 MIL/uL — ABNORMAL LOW (ref 4.22–5.81)
RDW: 17.2 % — ABNORMAL HIGH (ref 11.5–15.5)
WBC: 3.9 K/uL — ABNORMAL LOW (ref 4.0–10.5)
nRBC: 0 % (ref 0.0–0.2)

## 2023-12-15 LAB — PATHOLOGIST SMEAR REVIEW

## 2023-12-15 LAB — ECHOCARDIOGRAM COMPLETE
Area-P 1/2: 4.65 cm2
Height: 71 in
MV VTI: 3.34 cm2
S' Lateral: 3.2 cm
Weight: 2694.9 [oz_av]

## 2023-12-15 LAB — HEPATITIS B SURFACE ANTIBODY, QUANTITATIVE: Hep B S AB Quant (Post): 383 m[IU]/mL

## 2023-12-15 MED ORDER — CHLORHEXIDINE GLUCONATE CLOTH 2 % EX PADS
6.0000 | MEDICATED_PAD | Freq: Every day | CUTANEOUS | Status: DC
  Administered 2023-12-20: 6 via TOPICAL

## 2023-12-15 MED ORDER — LIDOCAINE-EPINEPHRINE 1 %-1:100000 IJ SOLN
30.0000 mL | Freq: Once | INTRAMUSCULAR | Status: DC
  Filled 2023-12-15 (×2): qty 40

## 2023-12-15 MED ORDER — LIDOCAINE-EPINEPHRINE 1 %-1:100000 IJ SOLN
INTRAMUSCULAR | Status: AC
  Filled 2023-12-15: qty 1

## 2023-12-15 NOTE — Progress Notes (Signed)
 Received patient in bed to unit.  Alert and oriented.  Informed consent signed and in chart.   TX duration: 2.5 hours 1k for 60 minutes then 2K  Patient tolerated well. Patient received pain med (dilaudid  0.5 mg x's 1) Hypotensive 70's-UF off 100 ml NS bolus, Continuous with intermittent sharp abdominal pain at end of treatment Transported back to the room  Alert, without acute distress.  Hand-off given to patient's nurse.   Access used: Left AVF Access issues: None  Total UF removed: 900 Medication(s) given: Dilaudid  0.5mg  IV Post HD VS: T98.4 Post HD weight: 76.4 kg  Neville Seip, RN [facility]   12/15/23 0222  Vitals  BP 106/73  MAP (mmHg) 84  BP Location Right Arm  BP Method Automatic  Patient Position (if appropriate) Lying  Pulse Rate (!) 103  ECG Heart Rate (!) 102  Resp 18  Oxygen Therapy  SpO2 100 %  O2 Device Nasal Cannula  O2 Flow Rate (L/min) 2 L/min  During Treatment Monitoring  Blood Flow Rate (mL/min) 349 mL/min  Arterial Pressure (mmHg) -276.96 mmHg  Venous Pressure (mmHg) 128.27 mmHg  TMP (mmHg) -6.66 mmHg  Ultrafiltration Rate (mL/min) 0 mL/min  Dialysate Flow Rate (mL/min) 300 ml/min  Dialysate Potassium Concentration 2  Dialysate Calcium  Concentration 2.5  Duration of HD Treatment -hour(s) 2.46 hour(s)  Cumulative Fluid Removed (mL) per Treatment  852.93  Post Treatment  Dialyzer Clearance Clear  Liters Processed 57  Fluid Removed (mL) 900 mL  Tolerated HD Treatment No (Comment)  Post-Hemodialysis Comments Medicated for pain x;s 1, B/P concerns  AVG/AVF Arterial Site Held (minutes) 10 minutes  AVG/AVF Venous Site Held (minutes) 10 minutes  Note  Patient Observations Intermittant sharp abdominal pain  Fistula / Graft Left Forearm Arteriovenous fistula  Placement Date/Time: 08/26/20 0807   Orientation: Left  Access Location: Forearm  Access Type: Arteriovenous fistula  Site Condition No complications  Fistula / Graft Assessment  Present;Thrill;Bruit  Status Deaccessed  Drainage Description None

## 2023-12-15 NOTE — Progress Notes (Signed)
 Patient discharged, AVS reviewed with patient. Patient verbalized understanding. Pt being transported by brother to [facility]. PIV removed

## 2023-12-15 NOTE — Progress Notes (Signed)
 Pt receives op HD at [facility] [place] [facility], TTS, 1100 chair time. Will continue to assist as needed.   Lavanda Valora Norell Dialysis Navigator [phone]

## 2023-12-15 NOTE — Progress Notes (Signed)
 Progress Note   Patient: [name] FMW:[id number] DOB: 1971-01-30 DOA: 12/14/2023  DOS: the patient was seen and examined on 12/15/2023   Brief hospital course:  52 year old male with a history of end-stage renal disease on hemodialysis Tuesday Thursday Saturday, history of lupus, history of ascites without a diagnosis of cirrhosis, history of systolic congestive heart EF of 35 to 40% who presents to the ER with a sudden onset of abdominal pain that started last night   Assessment and Plan:  Spontaneous bacterial peritonitis - Initial peritoneal fluid collection noting greater than 3600 WBCs, 95% neutrophils.  Initiated on ceftriaxone  plus Flagyl .  Large volume ascites - IR paracentesis performed today 8/20 with 1.8 L removed.  Last paracentesis June/2025.  ESRD - HD TTS.  Nephrology on board following closely.  HFpEF - Appears euvolemic.  Volume per nephrology/HD/UF.  Hyperkalemia - Received 1 dose of p.o. Lokelma .  Hypertension - Coreg  on board.  Hypothyroidism - Synthroid   Chronic pancytopenia - Continue to monitor.  SCDs for DVT prophylaxis.  Systemic lupus erythematosus with lung involvement - Stable.  Holding Plaquenil .  Subjective: Patient evaluated this morning.  Feels hungry, awaiting his paracentesis.  Still having some abdominal pain.  Denies fevers, shortness of breath, chest pain.  Physical Exam:  Vitals:   12/15/23 0230 12/15/23 0317 12/15/23 0628 12/15/23 0908  BP:  96/61 107/74 106/67  Pulse:  (!) 105 94 89  Resp:  20 19 20   Temp:  98.8 F (37.1 C) (!) 97.5 F (36.4 C) 98.1 F (36.7 C)  TempSrc:  Oral Oral   SpO2:  97% 94% 96%  Weight: 76.4 kg     Height:        GENERAL:  Alert, pleasant, no acute distress, disheveled HEENT:  EOMI CARDIOVASCULAR:  RRR, no murmurs appreciated RESPIRATORY:  Clear to auscultation, no wheezing, rales, or rhonchi GASTROINTESTINAL: Distended, minimally tender EXTREMITIES: Thin, no LE edema  bilaterally NEURO:  No new focal deficits appreciated SKIN:  No rashes noted PSYCH:  Appropriate mood and affect, anxious    Data Reviewed:  Imaging Studies: IR Paracentesis Result Date: 12/15/2023 INDICATION: 53 year old male. History of lupus, end-stage renal disease with recurrent ascites. Request for therapeutic paracentesis EXAM: ULTRASOUND GUIDED LEFT-SIDED THERAPEUTIC PARACENTESIS MEDICATIONS: Lidocaine  1% 10 mL COMPLICATIONS: None immediate. PROCEDURE: Informed written consent was obtained from the patient after a discussion of the risks, benefits and alternatives to treatment. A timeout was performed prior to the initiation of the procedure. Initial ultrasound scanning demonstrates a small amount of ascites within the right lower abdominal quadrant. The right lower abdomen was prepped and draped in the usual sterile fashion. 1% lidocaine  was used for local anesthesia. Following this, a 6 Fr Safe-T-Centesis catheter was introduced. An ultrasound image was saved for documentation purposes. The paracentesis was performed. The catheter was removed and a dressing was applied. The patient tolerated the procedure well without immediate post procedural complication. FINDINGS: A total of approximately 1.8 L of straw-colored fluid was removed. IMPRESSION: Successful ultrasound-guided therapeutic left-sided paracentesis yielding 1.8 liters of peritoneal fluid. Performed by Delon Beagle NP Electronically Signed   By: CHRISTELLA.  Shick M.D.   On: 12/15/2023 10:41   ECHOCARDIOGRAM COMPLETE Result Date: 12/15/2023    ECHOCARDIOGRAM REPORT   Patient Name:   [name] Date of Exam: 12/15/2023 Medical Rec #:  [id number]       Height:       71.0 in Accession #:    [id number]      Weight:  168.4 lb Date of Birth:  1970-05-21       BSA:          1.960 m Patient Age:    53 years        BP:           107/74 mmHg Patient Gender: M               HR:           90 bpm. Exam Location:  Inpatient Procedure: 2D  Echo, Cardiac Doppler and Color Doppler (Both Spectral and Color            Flow Doppler were utilized during procedure). Indications:    CHF Acute Systolic I50.21  History:        Patient has prior history of Echocardiogram examinations, most                 recent 04/28/2020. CHF.  Sonographer:    Tinnie Gosling RDCS Referring Phys: [phone] ERIC CHEN IMPRESSIONS  1. Left ventricular ejection fraction, by estimation, is 55 to 60%. The left ventricle has normal function. The left ventricle has no regional wall motion abnormalities. There is severe concentric left ventricular hypertrophy. Left ventricular diastolic  parameters are consistent with Grade I diastolic dysfunction (impaired relaxation).  2. Right ventricular systolic function is normal. The right ventricular size is normal.  3. Left atrial size was moderately dilated.  4. Right atrial size was mildly dilated.  5. The mitral valve is normal in structure. Trivial mitral valve regurgitation. No evidence of mitral stenosis.  6. The aortic valve is tricuspid. There is mild calcification of the aortic valve. Aortic valve regurgitation is not visualized. Aortic valve sclerosis/calcification is present, without any evidence of aortic stenosis.  7. Aortic dilatation noted. There is mild dilatation of the aortic root, measuring 40 mm.  8. The inferior vena cava is dilated in size with >50% respiratory variability, suggesting right atrial pressure of 8 mmHg. FINDINGS  Left Ventricle: Left ventricular ejection fraction, by estimation, is 55 to 60%. The left ventricle has normal function. The left ventricle has no regional wall motion abnormalities. The left ventricular internal cavity size was normal in size. There is  severe concentric left ventricular hypertrophy. Left ventricular diastolic parameters are consistent with Grade I diastolic dysfunction (impaired relaxation). Right Ventricle: The right ventricular size is normal. No increase in right ventricular wall  thickness. Right ventricular systolic function is normal. Left Atrium: Left atrial size was moderately dilated. Right Atrium: Right atrial size was mildly dilated. Pericardium: There is no evidence of pericardial effusion. Mitral Valve: The mitral valve is normal in structure. Trivial mitral valve regurgitation. No evidence of mitral valve stenosis. MV peak gradient, 4.2 mmHg. The mean mitral valve gradient is 3.0 mmHg. Tricuspid Valve: The tricuspid valve is normal in structure. Tricuspid valve regurgitation is mild . No evidence of tricuspid stenosis. Aortic Valve: The aortic valve is tricuspid. There is mild calcification of the aortic valve. Aortic valve regurgitation is not visualized. Aortic valve sclerosis/calcification is present, without any evidence of aortic stenosis. Pulmonic Valve: The pulmonic valve was normal in structure. Pulmonic valve regurgitation is trivial. No evidence of pulmonic stenosis. Aorta: Aortic dilatation noted. There is mild dilatation of the aortic root, measuring 40 mm. Venous: The inferior vena cava is dilated in size with greater than 50% respiratory variability, suggesting right atrial pressure of 8 mmHg. IAS/Shunts: No atrial level shunt detected by color flow Doppler.  LEFT VENTRICLE PLAX 2D  LVIDd:         4.60 cm   Diastology LVIDs:         3.20 cm   LV e' lateral:   8.16 cm/s LV PW:         2.10 cm   LV E/e' lateral: 13.1 LV IVS:        2.00 cm LVOT diam:     2.10 cm LV SV:         79 LV SV Index:   40 LVOT Area:     3.46 cm  RIGHT VENTRICLE             IVC RV S prime:     14.30 cm/s  IVC diam: 2.30 cm TAPSE (M-mode): 2.1 cm LEFT ATRIUM             Index        RIGHT ATRIUM           Index LA diam:        3.70 cm 1.89 cm/m   RA Area:     18.20 cm LA Vol (A2C):   76.1 ml 38.82 ml/m  RA Volume:   44.90 ml  22.91 ml/m LA Vol (A4C):   91.2 ml 46.53 ml/m LA Biplane Vol: 93.0 ml 47.45 ml/m  AORTIC VALVE LVOT Vmax:   127.00 cm/s LVOT Vmean:  80.100 cm/s LVOT VTI:    0.229 m   AORTA Ao Root diam: 4.00 cm Ao Asc diam:  3.70 cm MITRAL VALVE MV Area (PHT): 4.65 cm     SHUNTS MV Area VTI:   3.34 cm     Systemic VTI:  0.23 m MV Peak grad:  4.2 mmHg     Systemic Diam: 2.10 cm MV Mean grad:  3.0 mmHg MV Vmax:       1.03 m/s MV Vmean:      83.0 cm/s MV Decel Time: 163 msec MV E velocity: 107.00 cm/s MV A velocity: 117.00 cm/s MV E/A ratio:  0.91 Toribio Fuel MD Electronically signed by Toribio Fuel MD Signature Date/Time: 12/15/2023/9:40:29 AM    Final    CT ABDOMEN PELVIS W CONTRAST Result Date: 12/14/2023 CLINICAL DATA:  Right lower quadrant abdominal pain for 2 days. Nausea and vomiting. Hemodialysis patient. EXAM: CT ABDOMEN AND PELVIS WITH CONTRAST TECHNIQUE: Multidetector CT imaging of the abdomen and pelvis was performed using the standard protocol following bolus administration of intravenous contrast. RADIATION DOSE REDUCTION: This exam was performed according to the departmental dose-optimization program which includes automated exposure control, adjustment of the mA and/or kV according to patient size and/or use of iterative reconstruction technique. CONTRAST:  75mL OMNIPAQUE  IOHEXOL  350 MG/ML SOLN COMPARISON:  Abdominopelvic CT 08/04/2021 and 06/29/2021. FINDINGS: Lower chest: Interval improved aeration of the left lung base with mild residual atelectasis or scarring. No confluent airspace disease or significant pleural effusion. Moderate cardiomegaly without significant pericardial fluid. There is coronary artery atherosclerosis with calcifications of the mitral annulus. Hepatobiliary: The liver is normal in density without suspicious focal abnormality. No evidence of gallstones, gallbladder wall thickening or biliary dilatation. Pancreas: Unremarkable. No pancreatic ductal dilatation or surrounding inflammatory changes. Spleen: Normal in size without focal abnormality. Adrenals/Urinary Tract: Both adrenal glands appear normal. Moderate to severe renal cortical thinning  and atrophy bilaterally, progressive from previous examination. No significant contrast excretion is demonstrated on the delayed images. There is no hydronephrosis or urinary tract calculus. The bladder appears unremarkable for its degree of distention. Stomach/Bowel: No enteric contrast administered. The  stomach appears unremarkable for its degree of distention. Mild small bowel wall thickening with mucosal hyperenhancement in the mid abdomen. The appendix is not clearly seen on the current study, although there is no pericecal inflammation to suggest appendicitis. Mild diffuse colonic wall thickening. There are diverticular changes throughout the descending and sigmoid colon without focal surrounding inflammation. No evidence of bowel obstruction or perforation. Vascular/Lymphatic: Scattered prominent retroperitoneal lymph nodes, similar to previous study and likely reactive. Aortic and branch vessel atherosclerosis without evidence of aneurysm or large vessel occlusion. The portal, superior mesenteric and splenic veins are patent. Reproductive: The prostate gland and seminal vesicles appear unremarkable. Other: Moderate to large volume ascites again noted, similar in overall volume to the previous CT. There is apparent new diffuse peritoneal thickening without apparent nodularity. A large amount of fluid extends into the fall canal and screw, incompletely visualized but increased in volume from the previous study. No herniated bowel, organized fluid collection or pneumoperitoneum. Musculoskeletal: Chronic femoral head osteonecrosis bilaterally without subchondral collapse or significant secondary degenerative changes. Mild lumbar spondylosis. No acute osseous findings are identified. Generalized subcutaneous edema appears similar to previous CT. IMPRESSION: 1. Moderate to large volume ascites, similar in overall volume to the previous CT. There is new diffuse smooth peritoneal thickening without apparent  nodularity, suspicious for peritonitis. Consider diagnostic paracentesis. 2. Mild small bowel and colonic wall thickening, nonspecific in the setting of ascites. No evidence of bowel obstruction or perforation. The appendix is not discretely visualized, although there are no specific signs of appendicitis. 3. Progressive renal cortical thinning and atrophy bilaterally. No hydronephrosis or urinary tract calculus. 4. Chronic femoral head osteonecrosis bilaterally without subchondral collapse or significant secondary degenerative changes. 5.  Aortic Atherosclerosis (ICD10-I70.0). Electronically Signed   By: Elsie Perone M.D.   On: 12/14/2023 14:56   DG Chest 2 View Result Date: 12/14/2023 CLINICAL DATA:  SHOB EXAM: CHEST - 2 VIEW COMPARISON:  June 29, 2021 FINDINGS: Lower lung volumes . Bilateral perihilar interstitial opacities. No pneumothorax. Trace left pleural effusion. Mild cardiomegaly. Tortuous aorta with aortic atherosclerosis. No acute fracture or destructive lesions. Multilevel thoracic osteophytosis. IMPRESSION: Bilateral perihilar interstitial opacities, which may represent bronchovascular crowding due to low lung volumes, interstitial edema, or atypical/viral infection, in the correct clinical context. Electronically Signed   By: Rogelia Myers M.D.   On: 12/14/2023 13:09    Results are pending, will review when available.  Previous records (including but not limited to H&P, progress notes, nursing notes, TOC management) were reviewed in assessment of this patient.  Labs: CBC: Recent Labs  Lab 12/14/23 1150 12/15/23 0412  WBC 1.7* 3.9*  NEUTROABS  --  3.4  HGB 12.4* 11.1*  HCT 39.2 34.8*  MCV 89.7 88.5  PLT 77* 64*   Basic Metabolic Panel: Recent Labs  Lab 12/14/23 1150 12/15/23 0412  NA 133* 132*  K 5.5* 4.6  CL 95* 94*  CO2 24 23  GLUCOSE 85 51*  BUN 56* 36*  CREATININE 7.90* 5.94*  CALCIUM  8.3* 7.5*   Liver Function Tests: Recent Labs  Lab 12/14/23 1150  12/15/23 0412  AST 34 43*  ALT 21 20  ALKPHOS 68 47  BILITOT 1.0 0.5  PROT 8.3* 6.5  ALBUMIN  2.2* 1.7*   CBG: No results for input(s): GLUCAP in the last 168 hours.  Scheduled Meds:  carvedilol   25 mg Oral BID   Chlorhexidine  Gluconate Cloth  6 each Topical Q0600   levothyroxine   50 mcg Oral QAC breakfast   lidocaine -EPINEPHrine   30 mL Infiltration Once   sodium zirconium cyclosilicate   10 g Oral Once   Continuous Infusions:  cefTRIAXone  (ROCEPHIN )  IV 2 g (12/15/23 0806)   metronidazole  500 mg (12/15/23 0532)   PRN Meds:.acetaminophen  **OR** acetaminophen , albuterol , HYDROmorphone  (DILAUDID ) injection, melatonin, ondansetron  **OR** ondansetron  (ZOFRAN ) IV  Family Communication: None at bedside  Disposition: Status is: Inpatient Remains inpatient appropriate because: SBP     Time spent: 40 minutes  Length of inpatient stay: 1 days  Author: Carliss LELON Canales, DO 12/15/2023 12:34 PM  For on call review [email or web address].

## 2023-12-15 NOTE — Procedures (Signed)
 Ultrasound-guided  therapeutic paracentesis performed yielding 1.8 liters of straw colored fluid.  No immediate complications. EBL is none.

## 2023-12-15 NOTE — TOC Initial Note (Signed)
 Transition of Care [facility]) - Initial/Assessment Note    Patient Details  Name: [name] MRN: [id number] Date of Birth: 1970-12-12  [facility] CM/SW Contact:    Lendia Dais, LCSWA Phone Number: 12/15/2023, 10:48 AM  Clinical Narrative:  Pt is from home alone and receives assistance from his sister Rosaline when needed.   Pt reports no concerns with SDOH's. Patient works full time and has access to food, transportation, safe housing, and medications.   Pt reports that they have not seen their PCP in the last 12 months and that they are looking for a new one.   No ICM needs at this time. CSW will continue to follow.         Expected Discharge Plan: Home/Self Care Barriers to Discharge: Continued Medical Work up   Patient Goals and CMS Choice Patient states their goals for this hospitalization and ongoing recovery are:: Going home   Choice offered to / list presented to : NA      Expected Discharge Plan and Services In-house Referral: Clinical Social Work     Living arrangements for the past 2 months: Single Family Home                                      Prior Living Arrangements/Services Living arrangements for the past 2 months: Single Family Home Lives with:: Self Patient language and need for interpreter reviewed:: Yes        Need for Family Participation in Patient Care: Yes (Comment) Care giver support system in place?: Yes (comment)   Criminal Activity/Legal Involvement Pertinent to Current Situation/Hospitalization: No - Comment as needed  Activities of Daily Living   ADL Screening (condition at time of admission) Independently performs ADLs?: Yes (appropriate for developmental age) Is the patient deaf or have difficulty hearing?: No Does the patient have difficulty seeing, even when wearing glasses/contacts?: No Does the patient have difficulty concentrating, remembering, or making decisions?: No  Permission  Sought/Granted      Share Information with NAME: [name] (sister)           Emotional Assessment Appearance:: Appears stated age Attitude/Demeanor/Rapport: Engaged Affect (typically observed): Irritable Orientation: : Oriented to Self, Oriented to Place, Oriented to  Time, Oriented to Situation Alcohol / Substance Use: Not Applicable Psych Involvement: No (comment)  Admission diagnosis:  Spontaneous bacterial peritonitis (HCC) [K65.2] Generalized abdominal pain [R10.84] Other ascites [R18.8] Patient Active Problem List   Diagnosis Date Noted   Spontaneous bacterial peritonitis (HCC) 12/14/2023   Hyperkalemia 12/14/2023   Pericardial effusion 12/01/2021   Ascites 10/01/2020   Disorder of kidney and ureter, unspecified 10/01/2020   Essential hypertension 10/01/2020   Inflammatory arthritis 10/01/2020   Lupus nephritis (HCC) 10/01/2020   Thrombocytopenia (HCC) 10/01/2020   ESRD on dialysis (HCC) 07/03/2020   Weight loss 07/03/2020   Malnutrition of moderate degree 05/01/2020   Acquired hypothyroidism 11/27/2019   Cardiac murmur 11/24/2019   Chronic systolic CHF (congestive heart failure) (HCC) 11/06/2019   Pancytopenia (HCC) 02/06/2019   Alcohol use 02/06/2019   Elevated LFTs 02/06/2019   Vitamin D  deficiency 12/05/2018   False positive interferon-gamma release assay (IGRA) for tuberculosis 12/05/2018   Neurotic excoriations 11/01/2018   Polyarthralgia 06/11/2015   Systemic lupus erythematosus with lung involvement (HCC) 03/09/2015   PCP:  Berneta Elsie Sayre, MD Pharmacy:   [facility] [address], [place] - [phone]  [facility] [address] [place] [address] Phone: [phone] Fax: [phone]  [facility] [address] [place] [address] Phone: [phone] Fax: [phone]     Social Drivers of Health (SDOH) Social History: SDOH Screenings   Food Insecurity: No Food Insecurity  (12/15/2023)  Housing: Unknown (12/15/2023)  Transportation Needs: No Transportation Needs (12/15/2023)  Utilities: Not At Risk (12/15/2023)  Depression (PHQ2-9): Low Risk  (01/02/2022)  Tobacco Use: Low Risk  (12/14/2023)   SDOH Interventions:     Readmission Risk Interventions     No data to display

## 2023-12-15 NOTE — Progress Notes (Signed)
   12/14/23 2100  Assess: MEWS Score  Temp 100.1 F (37.8 C)  BP (!) 180/115  MAP (mmHg) 133  Pulse Rate (!) 124  SpO2 94 %  O2 Device Nasal Cannula  O2 Flow Rate (L/min) 2 L/min  Assess: MEWS Score  MEWS Temp 0  MEWS Systolic 0  MEWS Pulse 2  MEWS RR 0  MEWS LOC 0  MEWS Score 2  MEWS Score Color Yellow  Assess: if the MEWS score is Yellow or Red  Were vital signs accurate and taken at a resting state? Yes  Does the patient meet 2 or more of the SIRS criteria? Yes  Does the patient have a confirmed or suspected source of infection? Yes  MEWS guidelines implemented  No, previously yellow, continue vital signs every 4 hours  Notify: Charge Nurse/RN  Name of Charge Nurse/RN Notified Cyrah Mclamb RN  Provider Notification  Provider Name/Title Segar,MD  Date Provider Notified 12/14/23  Time Provider Notified 2212  Method of Notification  (secure chat)  Notification Reason Other (Comment) (BP 180/115 T100.1 HR 124  Yellow MEWS)  Provider response No new orders  Date of Provider Response 12/14/23  Time of Provider Response 2239  Assess: SIRS CRITERIA  SIRS Temperature  0  SIRS Respirations  0  SIRS Pulse 1  SIRS WBC 1  SIRS Score Sum  2

## 2023-12-15 NOTE — Plan of Care (Signed)

## 2023-12-15 NOTE — Hospital Course (Addendum)
 53 year old male with a history of end-stage renal disease on hemodialysis Tuesday Thursday Saturday, history of lupus, history of ascites without a diagnosis of cirrhosis, history of systolic congestive heart EF of 35 to 40% who presents to the ER with a sudden onset of abdominal pain that started last night    Assessment and Plan:   Acute metabolic encephalopathy-likely alcohol withdrawal - Beginning 8/22 patient appeared very anxious, delirious, paranoid, even hallucinating.  Etiology unclear.  Differentials include delirium, alcohol withdrawal, metabolic etiology.  Ammonia normal.  Discussion with sister bedside stating that patient does drink to cover pain from his lupus.  Although he has reduced his alcohol consumption.  Currently on CIWA protocol.  Appears mildly improved today.  Mitts and restraints removed.  Family at bedside providing supportive care and reorientation which is a big help.   Unwitnessed fall - Apparently patient became delirious and walked in the hallway and had an unwitnessed fall floor night of 8/22.  CT showing no intracranial abnormalities.     Spontaneous bacterial peritonitis - Initial peritoneal fluid collection noting greater than 3600 WBCs, 95% neutrophils.  Culture noting Klebsiella.  Continue ceftriaxone  (day 6 of 10-14), discontinued Flagyl .     Large volume ascites - IR paracentesis performed 8/20 with 1.8 L removed.     ESRD - HD TTS.  Nephrology on board following closely.   HFpEF - Appears euvolemic.  Volume per nephrology/HD/UF.   Hyperkalemia - Received 1 dose of p.o. Lokelma .  Managed via HD.   Hypertension - Coreg  on board.   Hypothyroidism - TSH 7.89.  Initiated on Synthroid  50 micrograms daily.   Chronic pancytopenia - Continue to monitor.  SCDs for DVT prophylaxis.   Systemic lupus erythematosus with lung involvement - Stable.  Holding Plaquenil .  Discussed with sister about ongoing need to follow-up closely as lupus appears to be  underlying cause of many of his comorbidities both directly and indirectly.   Goals of care - Patient has multiple comorbidities, very frail, low albumin , poor p.o. intake.  Discussed at length with patient's sister about need for close follow-up with multiple subspecialists.

## 2023-12-15 NOTE — Progress Notes (Signed)
  [facility] Progress Note   Subjective:   Seen and examined in her room. He reports discomfort with his ascites and is anxious to get his paracentesis. He was not happy about not knowing when it was scheduled. He reports that he is very hungry. Next HD 12/16/23  Objective Vitals:   12/15/23 0230 12/15/23 0317 12/15/23 0628 12/15/23 0908  BP:  96/61 107/74 106/67  Pulse:  (!) 105 94 89  Resp:  20 19 20   Temp:  98.8 F (37.1 C) (!) 97.5 F (36.4 C) 98.1 F (36.7 C)  TempSrc:  Oral Oral   SpO2:  97% 94% 96%  Weight: 76.4 kg     Height:       Exam Gen alert, no distress No rash, cyanosis or gangrene Sclera anicteric, throat clear  No jvd or bruits Chest clear bilat to bases, no rales/ wheezing RRR no MRG Abd 2+ ascites, minimally tender GU deferred MS no joint effusions or deformity Ext 1+ bilat pretib edema, no other edema Neuro is alert, Ox 3 , nf, no asterixis    L AVF+bruit  Additional Objective Labs: Basic Metabolic Panel: Recent Labs  Lab 12/14/23 1150 12/15/23 0412  NA 133* 132*  K 5.5* 4.6  CL 95* 94*  CO2 24 23  GLUCOSE 85 51*  BUN 56* 36*  CREATININE 7.90* 5.94*  CALCIUM  8.3* 7.5*   Liver Function Tests: Recent Labs  Lab 12/14/23 1150 12/15/23 0412  AST 34 43*  ALT 21 20  ALKPHOS 68 47  BILITOT 1.0 0.5  PROT 8.3* 6.5  ALBUMIN  2.2* 1.7*   Recent Labs  Lab 12/14/23 1150  LIPASE 31   CBC: Recent Labs  Lab 12/14/23 1150 12/15/23 0412  WBC 1.7* 3.9*  NEUTROABS  --  3.4  HGB 12.4* 11.1*  HCT 39.2 34.8*  MCV 89.7 88.5  PLT 77* 64*   Medications:  cefTRIAXone  (ROCEPHIN )  IV 2 g (12/15/23 0806)   metronidazole  500 mg (12/15/23 0532)    carvedilol   25 mg Oral BID   Chlorhexidine  Gluconate Cloth  6 each Topical Q0600   levothyroxine   50 mcg Oral QAC breakfast   lidocaine -EPINEPHrine   30 mL Infiltration Once   sodium zirconium cyclosilicate   10 g Oral Once   Home bp meds: Coreg  25 bid Hydralazine 25 bid (not  taking) Irbesartan  300 mg every day      OP HD: SW TTS 3h  B400  71kg   2K bath  AVF   Heparin  none   Labs -. Na 132, K= 4.6, BUN 36, creat 5.94, alb 1.7, wbc 3.9K, Hb 11.1   Assessment/ Plan: Abdominal pain: w/u in progress per ED, admitting team. CT showed findings suspicious for SBP. Per pmd. On ceftriaxone  and metronidazole .  ESRD: on HD TTS. Had HD overnight and 900 mL removed. Plan for HD 12/16/23 to try to keep him on his regular HD schedule.   HTN: is on 2 bp lowering meds at home, BP's okay recently. Follow. Cont home meds.  Volume: CXR clear, no congestion/ edema. Has been having N/V for 2 days, suspect euvolemic, possibly dry.  Anemia of esrd: Hb 11.1, no esa needs. Follow.  Recurrent ascites: has been getting paracenteses since 2022   Belvie Och, NP 12/15/2023, 12:57 PM  [facility]

## 2023-12-15 NOTE — Plan of Care (Signed)
  Problem: Education: Goal: Knowledge of General Education information will improve Description: Including pain rating scale, medication(s)/side effects and non-pharmacologic comfort measures 12/15/2023 1824 by Casimir Keturah LABOR, RN Outcome: Progressing 12/15/2023 1824 by Casimir Keturah LABOR, RN Outcome: Progressing   Problem: Health Behavior/Discharge Planning: Goal: Ability to manage health-related needs will improve 12/15/2023 1824 by Casimir Keturah LABOR, RN Outcome: Progressing 12/15/2023 1824 by Casimir Keturah LABOR, RN Outcome: Progressing   Problem: Clinical Measurements: Goal: Ability to maintain clinical measurements within normal limits will improve 12/15/2023 1824 by Casimir Keturah LABOR, RN Outcome: Progressing 12/15/2023 1824 by Casimir Keturah LABOR, RN Outcome: Progressing Goal: Will remain free from infection 12/15/2023 1824 by Casimir Keturah LABOR, RN Outcome: Progressing 12/15/2023 1824 by Casimir Keturah LABOR, RN Outcome: Progressing Goal: Diagnostic test results will improve 12/15/2023 1824 by Casimir Keturah LABOR, RN Outcome: Progressing 12/15/2023 1824 by Casimir Keturah LABOR, RN Outcome: Progressing Goal: Respiratory complications will improve 12/15/2023 1824 by Casimir Keturah LABOR, RN Outcome: Progressing 12/15/2023 1824 by Casimir Keturah LABOR, RN Outcome: Progressing Goal: Cardiovascular complication will be avoided 12/15/2023 1824 by Casimir Keturah LABOR, RN Outcome: Progressing 12/15/2023 1824 by Casimir Keturah LABOR, RN Outcome: Progressing   Problem: Activity: Goal: Risk for activity intolerance will decrease 12/15/2023 1824 by Casimir Keturah LABOR, RN Outcome: Progressing 12/15/2023 1824 by Casimir Keturah LABOR, RN Outcome: Progressing   Problem: Nutrition: Goal: Adequate nutrition will be maintained 12/15/2023 1824 by Casimir Keturah LABOR, RN Outcome: Progressing 12/15/2023 1824 by Casimir Keturah LABOR, RN Outcome: Progressing   Problem: Coping: Goal: Level of anxiety will  decrease 12/15/2023 1824 by Casimir Keturah LABOR, RN Outcome: Progressing 12/15/2023 1824 by Casimir Keturah LABOR, RN Outcome: Progressing   Problem: Elimination: Goal: Will not experience complications related to bowel motility 12/15/2023 1824 by Casimir Keturah LABOR, RN Outcome: Progressing 12/15/2023 1824 by Casimir Keturah LABOR, RN Outcome: Progressing Goal: Will not experience complications related to urinary retention 12/15/2023 1824 by Casimir Keturah LABOR, RN Outcome: Progressing 12/15/2023 1824 by Casimir Keturah LABOR, RN Outcome: Progressing   Problem: Pain Managment: Goal: General experience of comfort will improve and/or be controlled 12/15/2023 1824 by Casimir Keturah LABOR, RN Outcome: Progressing 12/15/2023 1824 by Casimir Keturah LABOR, RN Outcome: Progressing   Problem: Safety: Goal: Ability to remain free from injury will improve 12/15/2023 1824 by Casimir Keturah LABOR, RN Outcome: Progressing 12/15/2023 1824 by Casimir Keturah LABOR, RN Outcome: Progressing   Problem: Skin Integrity: Goal: Risk for impaired skin integrity will decrease 12/15/2023 1824 by Casimir Keturah LABOR, RN Outcome: Progressing 12/15/2023 1824 by Casimir Keturah LABOR, RN Outcome: Progressing

## 2023-12-15 NOTE — Progress Notes (Signed)
 Echocardiogram 2D Echocardiogram has been performed.  Tinnie FORBES Gosling RDCS 12/15/2023, 8:59 AM

## 2023-12-16 DIAGNOSIS — I5022 Chronic systolic (congestive) heart failure: Secondary | ICD-10-CM | POA: Diagnosis not present

## 2023-12-16 DIAGNOSIS — K652 Spontaneous bacterial peritonitis: Secondary | ICD-10-CM | POA: Diagnosis not present

## 2023-12-16 DIAGNOSIS — R188 Other ascites: Secondary | ICD-10-CM | POA: Diagnosis not present

## 2023-12-16 DIAGNOSIS — N186 End stage renal disease: Secondary | ICD-10-CM | POA: Diagnosis not present

## 2023-12-16 LAB — COMPREHENSIVE METABOLIC PANEL WITH GFR
ALT: 23 U/L (ref 0–44)
AST: 41 U/L (ref 15–41)
Albumin: 1.6 g/dL — ABNORMAL LOW (ref 3.5–5.0)
Alkaline Phosphatase: 41 U/L (ref 38–126)
Anion gap: 16 — ABNORMAL HIGH (ref 5–15)
BUN: 46 mg/dL — ABNORMAL HIGH (ref 6–20)
CO2: 24 mmol/L (ref 22–32)
Calcium: 7.3 mg/dL — ABNORMAL LOW (ref 8.9–10.3)
Chloride: 90 mmol/L — ABNORMAL LOW (ref 98–111)
Creatinine, Ser: 7.12 mg/dL — ABNORMAL HIGH (ref 0.61–1.24)
GFR, Estimated: 8 mL/min — ABNORMAL LOW (ref >60)
Glucose, Bld: 112 mg/dL — ABNORMAL HIGH (ref 70–99)
Potassium: 5.6 mmol/L — ABNORMAL HIGH (ref 3.5–5.1)
Sodium: 130 mmol/L — ABNORMAL LOW (ref 135–145)
Total Bilirubin: 0.7 mg/dL (ref 0.0–1.2)
Total Protein: 6.8 g/dL (ref 6.5–8.1)

## 2023-12-16 LAB — MAGNESIUM: Magnesium: 1.7 mg/dL (ref 1.7–2.4)

## 2023-12-16 LAB — CBC
HCT: 32.3 % — ABNORMAL LOW (ref 39.0–52.0)
Hemoglobin: 10.2 g/dL — ABNORMAL LOW (ref 13.0–17.0)
MCH: 28.5 pg (ref 26.0–34.0)
MCHC: 31.6 g/dL (ref 30.0–36.0)
MCV: 90.2 fL (ref 80.0–100.0)
Platelets: 73 K/uL — ABNORMAL LOW (ref 150–400)
RBC: 3.58 MIL/uL — ABNORMAL LOW (ref 4.22–5.81)
RDW: 17.4 % — ABNORMAL HIGH (ref 11.5–15.5)
WBC: 5 K/uL (ref 4.0–10.5)
nRBC: 0 % (ref 0.0–0.2)

## 2023-12-16 LAB — BODY FLUID CULTURE W GRAM STAIN

## 2023-12-16 LAB — PHOSPHORUS: Phosphorus: 7.4 mg/dL — ABNORMAL HIGH (ref 2.5–4.6)

## 2023-12-16 MED ORDER — PENTAFLUOROPROP-TETRAFLUOROETH EX AERO
1.0000 | INHALATION_SPRAY | CUTANEOUS | Status: DC | PRN
Start: 2023-12-16 — End: 2023-12-16

## 2023-12-16 NOTE — Plan of Care (Signed)
  Problem: Clinical Measurements: Goal: Ability to maintain clinical measurements within normal limits will improve Outcome: Progressing   Problem: Health Behavior/Discharge Planning: Goal: Ability to manage health-related needs will improve Outcome: Progressing   Problem: Activity: Goal: Risk for activity intolerance will decrease Outcome: Progressing   Problem: Coping: Goal: Level of anxiety will decrease Outcome: Progressing   Problem: Pain Managment: Goal: General experience of comfort will improve and/or be controlled Outcome: Progressing

## 2023-12-16 NOTE — Progress Notes (Signed)
 [name] Progress Note   Subjective:   Seen and examined in his room. He had paracentesis and they removed 1.8L. He now has a diet ordered. He was very sleepy this morning and not very interactive. BP stable. K 5.6 - should resolve with HD. Next HD 12/16/23  Objective Vitals:   12/15/23 1639 12/15/23 2029 12/16/23 0412 12/16/23 0845  BP: 103/61 99/78 99/63  (!) 108/53  Pulse: 88 86 91 85  Resp: 20 19 20 19   Temp: 98.3 F (36.8 C) 98.4 F (36.9 C) 98 F (36.7 C) 98 F (36.7 C)  TempSrc:  Oral Oral   SpO2: 92% 99% 94% 96%  Weight:      Height:       Exam Gen alert, no distress No rash, cyanosis or gangrene Sclera anicteric, throat clear  No jvd or bruits Chest clear bilat to bases, no rales/ wheezing RRR no MRG Abd 2+ ascites, minimally tender GU deferred MS no joint effusions or deformity Ext 1+ bilat pretib edema, no other edema Neuro is alert, Ox 3 , nf, no asterixis    L AVF+bruit  Additional Objective Labs: Basic Metabolic Panel: Recent Labs  Lab 12/14/23 1150 12/15/23 0412 12/16/23 0403  NA 133* 132* 130*  K 5.5* 4.6 5.6*  CL 95* 94* 90*  CO2 24 23 24   GLUCOSE 85 51* 112*  BUN 56* 36* 46*  CREATININE 7.90* 5.94* 7.12*  CALCIUM  8.3* 7.5* 7.3*  PHOS  --   --  7.4*   Liver Function Tests: Recent Labs  Lab 12/14/23 1150 12/15/23 0412 12/16/23 0403  AST 34 43* 41  ALT 21 20 23   ALKPHOS 68 47 41  BILITOT 1.0 0.5 0.7  PROT 8.3* 6.5 6.8  ALBUMIN  2.2* 1.7* 1.6*   Recent Labs  Lab 12/14/23 1150  LIPASE 31   CBC: Recent Labs  Lab 12/14/23 1150 12/15/23 0412 12/16/23 0403  WBC 1.7* 3.9* 5.0  NEUTROABS  --  3.4  --   HGB 12.4* 11.1* 10.2*  HCT 39.2 34.8* 32.3*  MCV 89.7 88.5 90.2  PLT 77* 64* 73*   Medications:  cefTRIAXone  (ROCEPHIN )  IV 2 g (12/16/23 0753)   metronidazole  Stopped (12/16/23 0713)    carvedilol   25 mg Oral BID   Chlorhexidine  Gluconate Cloth  6 each Topical Q0600   Chlorhexidine  Gluconate Cloth  6 each  Topical Q0600   levothyroxine   50 mcg Oral QAC breakfast   lidocaine -EPINEPHrine   30 mL Infiltration Once   sodium zirconium cyclosilicate   10 g Oral Once   Home bp meds: Coreg  25 bid Hydralazine 25 bid (not taking) Irbesartan  300 mg every day      OP HD: SW TTS 3h  B400  71kg   2K bath  AVF   Heparin  none   Labs -. Na 130, K= 5.6, BUN 46, creat 7.12, alb 1.6, wbc 5K, Hb 10.2   Assessment/ Plan: Abdominal pain: w/u in progress per ED, admitting team. CT showed findings suspicious for SBP. Per pmd. On ceftriaxone  and metronidazole .  ESRD: on HD TTS. Had HD 8/19 and 900 mL removed. Plan for HD 12/16/23 to try to keep him on his regular HD schedule.   HTN: is on 2 bp lowering meds at home, BP's okay recently. Follow. Cont home meds.  Volume: CXR clear, no congestion/ edema. Has been having N/V for 2 days, suspect euvolemic, possibly dry.  Anemia of esrd: Hb 10.2, no esa needs. Follow.  Recurrent ascites: has been getting  paracenteses since 2022. Had paracentesis 8/20 and they removed 1.6L.    Belvie Och, NP 12/16/2023, 12:03 PM  [facility]

## 2023-12-16 NOTE — Progress Notes (Signed)
 Notified A Robertson, RN patient will be scheduled for dialysis later in day instead of 1st shift.

## 2023-12-16 NOTE — Progress Notes (Signed)
 Progress Note   Patient: [name] FMW:[id number] DOB: 1970-11-18 DOA: 12/14/2023  DOS: the patient was seen and examined on 12/16/2023   Brief hospital course:  53 year old male with a history of end-stage renal disease on hemodialysis Tuesday Thursday Saturday, history of lupus, history of ascites without a diagnosis of cirrhosis, history of systolic congestive heart EF of 35 to 40% who presents to the ER with a sudden onset of abdominal pain that started last night    Assessment and Plan:   Spontaneous bacterial peritonitis - Initial peritoneal fluid collection noting greater than 3600 WBCs, 95% neutrophils.  Initiated on ceftriaxone  plus Flagyl  empirically (today day 3).   Large volume ascites - IR paracentesis performed today 8/20 with 1.8 L removed.  Last paracentesis June/2025.   ESRD - HD TTS.  Nephrology on board following closely.   HFpEF - Appears euvolemic.  Volume per nephrology/HD/UF.   Hyperkalemia - Received 1 dose of p.o. Lokelma .   Hypertension - Coreg  on board.   Hypothyroidism - Synthroid    Chronic pancytopenia - Continue to monitor.  SCDs for DVT prophylaxis.   Systemic lupus erythematosus with lung involvement - Stable.  Holding Plaquenil .   Subjective: Patient resting comfortably this morning.  States he feels a bit improved after paracentesis yesterday.  Still having some abdominal discomfort.  Also having some nausea.  Denies any fever, shortness of breath, chest pain.  Dialysis later today.  Physical Exam:  Vitals:   12/15/23 1639 12/15/23 2029 12/16/23 0412 12/16/23 0845  BP: 103/61 99/78 99/63  (!) 108/53  Pulse: 88 86 91 85  Resp: 20 19 20 19   Temp: 98.3 F (36.8 C) 98.4 F (36.9 C) 98 F (36.7 C) 98 F (36.7 C)  TempSrc:  Oral Oral   SpO2: 92% 99% 94% 96%  Weight:      Height:        GENERAL:  Alert, pleasant, no acute distress, disheveled HEENT:  EOMI CARDIOVASCULAR:  RRR, systolic murmur appreciated RESPIRATORY:  Clear  to auscultation, no wheezing, rales, or rhonchi GASTROINTESTINAL: Distended, minimally tender EXTREMITIES: Thin, no LE edema bilaterally NEURO:  No new focal deficits appreciated SKIN:  No rashes noted PSYCH:  Appropriate mood and affect, anxious   Data Reviewed:  Imaging Studies: IR Paracentesis Result Date: 12/15/2023 INDICATION: 53 year old male. History of lupus, end-stage renal disease with recurrent ascites. Request for therapeutic paracentesis EXAM: ULTRASOUND GUIDED LEFT-SIDED THERAPEUTIC PARACENTESIS MEDICATIONS: Lidocaine  1% 10 mL COMPLICATIONS: None immediate. PROCEDURE: Informed written consent was obtained from the patient after a discussion of the risks, benefits and alternatives to treatment. A timeout was performed prior to the initiation of the procedure. Initial ultrasound scanning demonstrates a small amount of ascites within the right lower abdominal quadrant. The right lower abdomen was prepped and draped in the usual sterile fashion. 1% lidocaine  was used for local anesthesia. Following this, a 6 Fr Safe-T-Centesis catheter was introduced. An ultrasound image was saved for documentation purposes. The paracentesis was performed. The catheter was removed and a dressing was applied. The patient tolerated the procedure well without immediate post procedural complication. FINDINGS: A total of approximately 1.8 L of straw-colored fluid was removed. IMPRESSION: Successful ultrasound-guided therapeutic left-sided paracentesis yielding 1.8 liters of peritoneal fluid. Performed by Delon Beagle NP Electronically Signed   By: CHRISTELLA.  Shick M.D.   On: 12/15/2023 10:41   ECHOCARDIOGRAM COMPLETE Result Date: 12/15/2023    ECHOCARDIOGRAM REPORT   Patient Name:   [name] Date of Exam: 12/15/2023 Medical Rec #:  [id number]       Height:       71.0 in Accession #:    [id number]      Weight:       168.4 lb Date of Birth:  10-Sep-1970       BSA:          1.960 m Patient Age:    53 years         BP:           107/74 mmHg Patient Gender: M               HR:           90 bpm. Exam Location:  Inpatient Procedure: 2D Echo, Cardiac Doppler and Color Doppler (Both Spectral and Color            Flow Doppler were utilized during procedure). Indications:    CHF Acute Systolic I50.21  History:        Patient has prior history of Echocardiogram examinations, most                 recent 04/28/2020. CHF.  Sonographer:    Tinnie Gosling RDCS Referring Phys: [phone] ERIC CHEN IMPRESSIONS  1. Left ventricular ejection fraction, by estimation, is 55 to 60%. The left ventricle has normal function. The left ventricle has no regional wall motion abnormalities. There is severe concentric left ventricular hypertrophy. Left ventricular diastolic  parameters are consistent with Grade I diastolic dysfunction (impaired relaxation).  2. Right ventricular systolic function is normal. The right ventricular size is normal.  3. Left atrial size was moderately dilated.  4. Right atrial size was mildly dilated.  5. The mitral valve is normal in structure. Trivial mitral valve regurgitation. No evidence of mitral stenosis.  6. The aortic valve is tricuspid. There is mild calcification of the aortic valve. Aortic valve regurgitation is not visualized. Aortic valve sclerosis/calcification is present, without any evidence of aortic stenosis.  7. Aortic dilatation noted. There is mild dilatation of the aortic root, measuring 40 mm.  8. The inferior vena cava is dilated in size with >50% respiratory variability, suggesting right atrial pressure of 8 mmHg. FINDINGS  Left Ventricle: Left ventricular ejection fraction, by estimation, is 55 to 60%. The left ventricle has normal function. The left ventricle has no regional wall motion abnormalities. The left ventricular internal cavity size was normal in size. There is  severe concentric left ventricular hypertrophy. Left ventricular diastolic parameters are consistent with Grade I diastolic dysfunction  (impaired relaxation). Right Ventricle: The right ventricular size is normal. No increase in right ventricular wall thickness. Right ventricular systolic function is normal. Left Atrium: Left atrial size was moderately dilated. Right Atrium: Right atrial size was mildly dilated. Pericardium: There is no evidence of pericardial effusion. Mitral Valve: The mitral valve is normal in structure. Trivial mitral valve regurgitation. No evidence of mitral valve stenosis. MV peak gradient, 4.2 mmHg. The mean mitral valve gradient is 3.0 mmHg. Tricuspid Valve: The tricuspid valve is normal in structure. Tricuspid valve regurgitation is mild . No evidence of tricuspid stenosis. Aortic Valve: The aortic valve is tricuspid. There is mild calcification of the aortic valve. Aortic valve regurgitation is not visualized. Aortic valve sclerosis/calcification is present, without any evidence of aortic stenosis. Pulmonic Valve: The pulmonic valve was normal in structure. Pulmonic valve regurgitation is trivial. No evidence of pulmonic stenosis. Aorta: Aortic dilatation noted. There is mild dilatation of the aortic root, measuring 40 mm. Venous: The inferior  vena cava is dilated in size with greater than 50% respiratory variability, suggesting right atrial pressure of 8 mmHg. IAS/Shunts: No atrial level shunt detected by color flow Doppler.  LEFT VENTRICLE PLAX 2D LVIDd:         4.60 cm   Diastology LVIDs:         3.20 cm   LV e' lateral:   8.16 cm/s LV PW:         2.10 cm   LV E/e' lateral: 13.1 LV IVS:        2.00 cm LVOT diam:     2.10 cm LV SV:         79 LV SV Index:   40 LVOT Area:     3.46 cm  RIGHT VENTRICLE             IVC RV S prime:     14.30 cm/s  IVC diam: 2.30 cm TAPSE (M-mode): 2.1 cm LEFT ATRIUM             Index        RIGHT ATRIUM           Index LA diam:        3.70 cm 1.89 cm/m   RA Area:     18.20 cm LA Vol (A2C):   76.1 ml 38.82 ml/m  RA Volume:   44.90 ml  22.91 ml/m LA Vol (A4C):   91.2 ml 46.53 ml/m LA  Biplane Vol: 93.0 ml 47.45 ml/m  AORTIC VALVE LVOT Vmax:   127.00 cm/s LVOT Vmean:  80.100 cm/s LVOT VTI:    0.229 m  AORTA Ao Root diam: 4.00 cm Ao Asc diam:  3.70 cm MITRAL VALVE MV Area (PHT): 4.65 cm     SHUNTS MV Area VTI:   3.34 cm     Systemic VTI:  0.23 m MV Peak grad:  4.2 mmHg     Systemic Diam: 2.10 cm MV Mean grad:  3.0 mmHg MV Vmax:       1.03 m/s MV Vmean:      83.0 cm/s MV Decel Time: 163 msec MV E velocity: 107.00 cm/s MV A velocity: 117.00 cm/s MV E/A ratio:  0.91 Toribio Fuel MD Electronically signed by Toribio Fuel MD Signature Date/Time: 12/15/2023/9:40:29 AM    Final    CT ABDOMEN PELVIS W CONTRAST Result Date: 12/14/2023 CLINICAL DATA:  Right lower quadrant abdominal pain for 2 days. Nausea and vomiting. Hemodialysis patient. EXAM: CT ABDOMEN AND PELVIS WITH CONTRAST TECHNIQUE: Multidetector CT imaging of the abdomen and pelvis was performed using the standard protocol following bolus administration of intravenous contrast. RADIATION DOSE REDUCTION: This exam was performed according to the departmental dose-optimization program which includes automated exposure control, adjustment of the mA and/or kV according to patient size and/or use of iterative reconstruction technique. CONTRAST:  75mL OMNIPAQUE  IOHEXOL  350 MG/ML SOLN COMPARISON:  Abdominopelvic CT 08/04/2021 and 06/29/2021. FINDINGS: Lower chest: Interval improved aeration of the left lung base with mild residual atelectasis or scarring. No confluent airspace disease or significant pleural effusion. Moderate cardiomegaly without significant pericardial fluid. There is coronary artery atherosclerosis with calcifications of the mitral annulus. Hepatobiliary: The liver is normal in density without suspicious focal abnormality. No evidence of gallstones, gallbladder wall thickening or biliary dilatation. Pancreas: Unremarkable. No pancreatic ductal dilatation or surrounding inflammatory changes. Spleen: Normal in size without  focal abnormality. Adrenals/Urinary Tract: Both adrenal glands appear normal. Moderate to severe renal cortical thinning and atrophy bilaterally, progressive from previous  examination. No significant contrast excretion is demonstrated on the delayed images. There is no hydronephrosis or urinary tract calculus. The bladder appears unremarkable for its degree of distention. Stomach/Bowel: No enteric contrast administered. The stomach appears unremarkable for its degree of distention. Mild small bowel wall thickening with mucosal hyperenhancement in the mid abdomen. The appendix is not clearly seen on the current study, although there is no pericecal inflammation to suggest appendicitis. Mild diffuse colonic wall thickening. There are diverticular changes throughout the descending and sigmoid colon without focal surrounding inflammation. No evidence of bowel obstruction or perforation. Vascular/Lymphatic: Scattered prominent retroperitoneal lymph nodes, similar to previous study and likely reactive. Aortic and branch vessel atherosclerosis without evidence of aneurysm or large vessel occlusion. The portal, superior mesenteric and splenic veins are patent. Reproductive: The prostate gland and seminal vesicles appear unremarkable. Other: Moderate to large volume ascites again noted, similar in overall volume to the previous CT. There is apparent new diffuse peritoneal thickening without apparent nodularity. A large amount of fluid extends into the fall canal and screw, incompletely visualized but increased in volume from the previous study. No herniated bowel, organized fluid collection or pneumoperitoneum. Musculoskeletal: Chronic femoral head osteonecrosis bilaterally without subchondral collapse or significant secondary degenerative changes. Mild lumbar spondylosis. No acute osseous findings are identified. Generalized subcutaneous edema appears similar to previous CT. IMPRESSION: 1. Moderate to large volume ascites,  similar in overall volume to the previous CT. There is new diffuse smooth peritoneal thickening without apparent nodularity, suspicious for peritonitis. Consider diagnostic paracentesis. 2. Mild small bowel and colonic wall thickening, nonspecific in the setting of ascites. No evidence of bowel obstruction or perforation. The appendix is not discretely visualized, although there are no specific signs of appendicitis. 3. Progressive renal cortical thinning and atrophy bilaterally. No hydronephrosis or urinary tract calculus. 4. Chronic femoral head osteonecrosis bilaterally without subchondral collapse or significant secondary degenerative changes. 5.  Aortic Atherosclerosis (ICD10-I70.0). Electronically Signed   By: Elsie Perone M.D.   On: 12/14/2023 14:56   DG Chest 2 View Result Date: 12/14/2023 CLINICAL DATA:  SHOB EXAM: CHEST - 2 VIEW COMPARISON:  June 29, 2021 FINDINGS: Lower lung volumes . Bilateral perihilar interstitial opacities. No pneumothorax. Trace left pleural effusion. Mild cardiomegaly. Tortuous aorta with aortic atherosclerosis. No acute fracture or destructive lesions. Multilevel thoracic osteophytosis. IMPRESSION: Bilateral perihilar interstitial opacities, which may represent bronchovascular crowding due to low lung volumes, interstitial edema, or atypical/viral infection, in the correct clinical context. Electronically Signed   By: Rogelia Myers M.D.   On: 12/14/2023 13:09    There are no new results to review at this time.  Previous records (including but not limited to H&P, progress notes, nursing notes, TOC management) were reviewed in assessment of this patient.  Labs: CBC: Recent Labs  Lab 12/14/23 1150 12/15/23 0412 12/16/23 0403  WBC 1.7* 3.9* 5.0  NEUTROABS  --  3.4  --   HGB 12.4* 11.1* 10.2*  HCT 39.2 34.8* 32.3*  MCV 89.7 88.5 90.2  PLT 77* 64* 73*   Basic Metabolic Panel: Recent Labs  Lab 12/14/23 1150 12/15/23 0412 12/16/23 0403  NA 133* 132* 130*   K 5.5* 4.6 5.6*  CL 95* 94* 90*  CO2 24 23 24   GLUCOSE 85 51* 112*  BUN 56* 36* 46*  CREATININE 7.90* 5.94* 7.12*  CALCIUM  8.3* 7.5* 7.3*  MG  --   --  1.7  PHOS  --   --  7.4*   Liver Function Tests: Recent Labs  Lab 12/14/23 1150 12/15/23 0412 12/16/23 0403  AST 34 43* 41  ALT 21 20 23   ALKPHOS 68 47 41  BILITOT 1.0 0.5 0.7  PROT 8.3* 6.5 6.8  ALBUMIN  2.2* 1.7* 1.6*   CBG: No results for input(s): GLUCAP in the last 168 hours.  Scheduled Meds:  carvedilol   25 mg Oral BID   Chlorhexidine  Gluconate Cloth  6 each Topical Q0600   Chlorhexidine  Gluconate Cloth  6 each Topical Q0600   levothyroxine   50 mcg Oral QAC breakfast   lidocaine -EPINEPHrine   30 mL Infiltration Once   sodium zirconium cyclosilicate   10 g Oral Once   Continuous Infusions:  cefTRIAXone  (ROCEPHIN )  IV 2 g (12/16/23 0753)   metronidazole  Stopped (12/16/23 0713)   PRN Meds:.acetaminophen  **OR** acetaminophen , albuterol , HYDROmorphone  (DILAUDID ) injection, melatonin, ondansetron  **OR** ondansetron  (ZOFRAN ) IV, pentafluoroprop-tetrafluoroeth  Family Communication: None at bedside  Disposition: Status is: Inpatient Remains inpatient appropriate because: ESRD, SBP     Time spent: 38 minutes  Length of inpatient stay: 2 days  Author: Carliss LELON Canales, DO 12/16/2023 12:01 PM  For on call review [email or web address].

## 2023-12-16 NOTE — Progress Notes (Signed)
 Received patient in bed to unit.  Alert and oriented.  Informed consent signed and in chart.   TX duration: 3 hours.  Patient tolerated well.  Transported back to the room  Alert, without acute distress.  Hand-off given to patient's nurse.   Access used: none Access issues: fistula  Total UF removed: - Medication(s) given: dilaudid  0.5 mg/iv Post HD VS: 97/57; 75 Post HD weight: unable to obtain.     12/16/23 2129  Vitals  Temp 98.6 F (37 C)  Temp Source Oral  BP (!) 97/54  MAP (mmHg) 75  BP Location Right Arm  BP Method Automatic  Patient Position (if appropriate) Lying  Pulse Rate 98  Pulse Rate Source Monitor  ECG Heart Rate 97  Resp 17  Oxygen Therapy  O2 Device Room Air  Patient Activity (if Appropriate) In bed  Pulse Oximetry Type Continuous  During Treatment Monitoring  Blood Flow Rate (mL/min) 0 mL/min  Arterial Pressure (mmHg) -1.41 mmHg  Venous Pressure (mmHg) -1.41 mmHg  TMP (mmHg) 11.51 mmHg  Ultrafiltration Rate (mL/min) 0 mL/min  Dialysate Flow Rate (mL/min) 299 ml/min  Duration of HD Treatment -hour(s) 3 hour(s)  Cumulative Fluid Removed (mL) per Treatment  -77.03  HD Safety Checks Performed Yes  Post Treatment  Dialyzer Clearance Lightly streaked  Hemodialysis Intake (mL) 200 mL  Liters Processed 72  Fluid Removed (mL) -100 mL  Tolerated HD Treatment No (Comment)  Post-Hemodialysis Comments HD tx comppleted as expected, no tolerated well, c/o abdominal pain, has been hypotensive.  AVG/AVF Arterial Site Held (minutes) 10 minutes  AVG/AVF Venous Site Held (minutes) 10 minutes  Note  Patient Observations pt is in bed resting  Fistula / Graft Left Forearm Arteriovenous fistula  Placement Date/Time: 08/26/20 0807   Orientation: Left  Access Location: Forearm  Access Type: Arteriovenous fistula  Site Condition No complications  Fistula / Graft Assessment Present;Thrill;Bruit  Status Accessed;Deaccessed  Drainage Description None      12/16/23 2129  Vitals  Temp 98.6 F (37 C)  Temp Source Oral  BP (!) 97/54  MAP (mmHg) 75  BP Location Right Arm  BP Method Automatic  Patient Position (if appropriate) Lying  Pulse Rate 98  Pulse Rate Source Monitor  ECG Heart Rate 97  Resp 17  Oxygen Therapy  O2 Device Room Air  Patient Activity (if Appropriate) In bed  Pulse Oximetry Type Continuous  During Treatment Monitoring  Blood Flow Rate (mL/min) 0 mL/min  Arterial Pressure (mmHg) -1.41 mmHg  Venous Pressure (mmHg) -1.41 mmHg  TMP (mmHg) 11.51 mmHg  Ultrafiltration Rate (mL/min) 0 mL/min  Dialysate Flow Rate (mL/min) 299 ml/min  Duration of HD Treatment -hour(s) 3 hour(s)  Cumulative Fluid Removed (mL) per Treatment  -77.03  HD Safety Checks Performed Yes  Post Treatment  Dialyzer Clearance Lightly streaked  Hemodialysis Intake (mL) 200 mL  Liters Processed 72  Fluid Removed (mL) -100 mL  Tolerated HD Treatment No (Comment)  Post-Hemodialysis Comments HD tx comppleted as expected, no tolerated well, c/o abdominal pain, has been hypotensive.  AVG/AVF Arterial Site Held (minutes) 10 minutes  AVG/AVF Venous Site Held (minutes) 10 minutes  Note  Patient Observations pt is in bed resting  Fistula / Graft Left Forearm Arteriovenous fistula  Placement Date/Time: 08/26/20 0807   Orientation: Left  Access Location: Forearm  Access Type: Arteriovenous fistula  Site Condition No complications  Fistula / Graft Assessment Present;Thrill;Bruit  Status Accessed;Deaccessed  Drainage Description None    [name]

## 2023-12-17 ENCOUNTER — Inpatient Hospital Stay (HOSPITAL_COMMUNITY)

## 2023-12-17 DIAGNOSIS — F10931 Alcohol use, unspecified with withdrawal delirium: Secondary | ICD-10-CM | POA: Diagnosis not present

## 2023-12-17 DIAGNOSIS — R188 Other ascites: Secondary | ICD-10-CM | POA: Diagnosis not present

## 2023-12-17 DIAGNOSIS — M3213 Lung involvement in systemic lupus erythematosus: Secondary | ICD-10-CM

## 2023-12-17 DIAGNOSIS — K652 Spontaneous bacterial peritonitis: Secondary | ICD-10-CM | POA: Diagnosis not present

## 2023-12-17 DIAGNOSIS — G9341 Metabolic encephalopathy: Secondary | ICD-10-CM | POA: Diagnosis not present

## 2023-12-17 LAB — GLUCOSE, CAPILLARY: Glucose-Capillary: 79 mg/dL (ref 70–99)

## 2023-12-17 LAB — CBC
HCT: 32.1 % — ABNORMAL LOW (ref 39.0–52.0)
Hemoglobin: 10.2 g/dL — ABNORMAL LOW (ref 13.0–17.0)
MCH: 28.3 pg (ref 26.0–34.0)
MCHC: 31.8 g/dL (ref 30.0–36.0)
MCV: 89.2 fL (ref 80.0–100.0)
Platelets: 86 K/uL — ABNORMAL LOW (ref 150–400)
RBC: 3.6 MIL/uL — ABNORMAL LOW (ref 4.22–5.81)
RDW: 17.4 % — ABNORMAL HIGH (ref 11.5–15.5)
WBC: 5.3 K/uL (ref 4.0–10.5)
nRBC: 0 % (ref 0.0–0.2)

## 2023-12-17 LAB — BASIC METABOLIC PANEL WITH GFR
Anion gap: 13 (ref 5–15)
BUN: 28 mg/dL — ABNORMAL HIGH (ref 6–20)
CO2: 24 mmol/L (ref 22–32)
Calcium: 7.6 mg/dL — ABNORMAL LOW (ref 8.9–10.3)
Chloride: 94 mmol/L — ABNORMAL LOW (ref 98–111)
Creatinine, Ser: 4.97 mg/dL — ABNORMAL HIGH (ref 0.61–1.24)
GFR, Estimated: 13 mL/min — ABNORMAL LOW (ref >60)
Glucose, Bld: 77 mg/dL (ref 70–99)
Potassium: 4.3 mmol/L (ref 3.5–5.1)
Sodium: 131 mmol/L — ABNORMAL LOW (ref 135–145)

## 2023-12-17 LAB — TSH: TSH: 7.89 u[IU]/mL — ABNORMAL HIGH (ref 0.350–4.500)

## 2023-12-17 LAB — PHOSPHORUS: Phosphorus: 4.7 mg/dL — ABNORMAL HIGH (ref 2.5–4.6)

## 2023-12-17 LAB — MAGNESIUM: Magnesium: 1.7 mg/dL (ref 1.7–2.4)

## 2023-12-17 LAB — AMMONIA: Ammonia: 24 umol/L (ref 9–35)

## 2023-12-17 MED ORDER — LORAZEPAM 2 MG/ML IJ SOLN
2.0000 mg | Freq: Once | INTRAMUSCULAR | Status: AC
  Administered 2023-12-17: 2 mg via INTRAMUSCULAR

## 2023-12-17 MED ORDER — LORAZEPAM 2 MG/ML IJ SOLN
1.0000 mg | INTRAMUSCULAR | Status: DC | PRN
  Filled 2023-12-17: qty 2
  Filled 2023-12-17: qty 1

## 2023-12-17 MED ORDER — DIAZEPAM 5 MG PO TABS: 0.0000 mg | ORAL_TABLET | Freq: Four times a day (QID) | ORAL | Status: DC | PRN

## 2023-12-17 MED ORDER — DIAZEPAM 5 MG/ML IJ SOLN: 0.0000 mg | Freq: Four times a day (QID) | INTRAMUSCULAR | Status: DC | PRN

## 2023-12-17 MED ORDER — LORAZEPAM 1 MG PO TABS
1.0000 mg | ORAL_TABLET | ORAL | Status: DC | PRN
  Administered 2023-12-17: 2 mg via ORAL
  Filled 2023-12-17: qty 2

## 2023-12-17 MED ORDER — DIAZEPAM 5 MG/ML IJ SOLN: 5.0000 mg | Freq: Three times a day (TID) | INTRAMUSCULAR | Status: DC | PRN

## 2023-12-17 MED ORDER — THIAMINE MONONITRATE 100 MG PO TABS
100.0000 mg | ORAL_TABLET | Freq: Every day | ORAL | Status: DC
  Administered 2023-12-18 – 2023-12-29 (×8): 100 mg via ORAL
  Filled 2023-12-17 (×11): qty 1

## 2023-12-17 MED ORDER — LORAZEPAM 2 MG/ML IJ SOLN: 4.0000 mg | Freq: Once | INTRAMUSCULAR | Status: DC

## 2023-12-17 MED ORDER — ADULT MULTIVITAMIN W/MINERALS CH
1.0000 | ORAL_TABLET | Freq: Every day | ORAL | Status: DC
  Administered 2023-12-18 – 2023-12-25 (×5): 1 via ORAL
  Filled 2023-12-17 (×5): qty 1

## 2023-12-17 MED ORDER — THIAMINE HCL 100 MG/ML IJ SOLN
100.0000 mg | Freq: Every day | INTRAMUSCULAR | Status: DC
  Administered 2023-12-25 – 2023-12-26 (×2): 100 mg via INTRAVENOUS
  Filled 2023-12-17 (×2): qty 2

## 2023-12-17 MED ORDER — FOLIC ACID 1 MG PO TABS
1.0000 mg | ORAL_TABLET | Freq: Every day | ORAL | Status: DC
  Administered 2023-12-18 – 2023-12-29 (×10): 1 mg via ORAL
  Filled 2023-12-17 (×12): qty 1

## 2023-12-17 MED ORDER — MIDAZOLAM HCL 2 MG/2ML IJ SOLN: 2.0000 mg | Freq: Once | INTRAMUSCULAR | Status: DC

## 2023-12-17 NOTE — Progress Notes (Signed)
 Dr. Charlton on unit to assess pt. Pt's sister Merilee updated on fall and current condition, as well as able to speak with Dr. Charlton via phone.

## 2023-12-17 NOTE — Progress Notes (Signed)
 Patient was seen for increased confusion and fall.   He is being treated for suspected alcohol withdrawal, has CIWA of 16, and reports hitting his head and face when he fell. There is no facial tenderness or deformity. No obvious injuries.   He lost IV access and is receiving a dose of IM benzodiazepine. Plan to check head CT, use soft restraints for now, get safety sitter, and reestablish IV access. Patient's sister was updated by phone.

## 2023-12-17 NOTE — Plan of Care (Signed)

## 2023-12-17 NOTE — Progress Notes (Signed)
 Soft belt restraint applied, unable to apply soft wrist restraints due to lower arm fistula. Dr. Charlton on unit and made aware. Pt's sister updated again on interventions including restraint and was able to speak with pt via phone.

## 2023-12-17 NOTE — Progress Notes (Signed)
 Pt noted with an unwitnessed fall in hallway. Noted sidelying on left side. Pt remains disoriented and hallucinating. Reports hitting head. Abrasion noted to left cheek, no other visible injuries noted. Able to move all extremities without difficulty. Dr. Charlton notified, will come to unit to assess shortly.

## 2023-12-17 NOTE — TOC CAGE-AID Note (Signed)
 Transition of Care [facility]) - CAGE-AID Screening   Patient Details  Name: [name] MRN: [id number] Date of Birth: 10-27-1970  Transition of [facility]) CM/SW Contact:    Lendia Dais, LCSWA Phone Number: 12/17/2023, 4:17 PM   Clinical Narrative:  CSW informed patient of consult to do a substance use assessment. Patients sister Rosaline was in the room and stated that it wasn't a good time for the patient to do an assessment due to the patient hallucinating throughout the day. The patient declined to answer any questions. Substance use resources were not provided.    CAGE-AID Screening: Substance Abuse Screening unable to be completed due to: : Patient unable to participate             Substance Abuse Education Offered: No  Substance abuse interventions: Other (must comment) (Pt declined)

## 2023-12-17 NOTE — Progress Notes (Signed)
 Patient is refusing morning antibiotics, stating that he had a bad night with the staff and don't trust anyone. Reinforced education on the importance of taking the antibiotics. Patient verbalized understanding but continues to refuse. MD notified.

## 2023-12-17 NOTE — Progress Notes (Addendum)
 Pt admitted with peritonitis, is a HD pt. Noted to start exhibiting AMS last night and now throughout the day. Pt has been agitated, restless and refusing all meds and any po intake due to belief that we are attempting to kill him. Attending aware and placed on delirium precautions and initiated CIWA protocol for possible withdrawal symptoms. Pt now in room very restless and agitated, noted with auditory and visual hallucinations. Believes father is in the room with him. Attempting to get out of bed unassisted, unsteady. Pt assisted to recliner at this time with chair alarm due to refusal to stay in bed. CIWA scoring 16 based on agitation and hallucinations. Pt refusing vitals at this time. Pt refusing medications at this time. Dr. Charlton notified.

## 2023-12-17 NOTE — Progress Notes (Signed)
 [facility] Progress Note   Subjective:   Seen and examined in his room. He states that he does not feel well this morning. He had HD on 8/221/25. He denies dyspnea or CP this morning.  Next HD 12/18/23  Objective Vitals:   12/16/23 2129 12/16/23 2214 12/17/23 0424 12/17/23 0756  BP: (!) 97/54 96/68 105/69 120/70  Pulse: 98 97 97 99  Resp: 17 19 19 18   Temp: 98.6 F (37 C) 98.6 F (37 C) 98.2 F (36.8 C) 98.1 F (36.7 C)  TempSrc: Oral Oral Oral   SpO2: 94% 93% 94% 97%  Weight:      Height:       Exam Gen alert, no distress No rash, cyanosis or gangrene Sclera anicteric, throat clear  No jvd or bruits Chest clear bilat to bases, no rales/ wheezing RRR no MRG Abd 2+ ascites, minimally tender GU deferred MS no joint effusions or deformity Ext 1+ bilat pretib edema, no other edema Neuro is alert, Ox 3 , nf, no asterixis    L AVF+bruit  Additional Objective Labs: Basic Metabolic Panel: Recent Labs  Lab 12/15/23 0412 12/16/23 0403 12/17/23 0502  NA 132* 130* 131*  K 4.6 5.6* 4.3  CL 94* 90* 94*  CO2 23 24 24   GLUCOSE 51* 112* 77  BUN 36* 46* 28*  CREATININE 5.94* 7.12* 4.97*  CALCIUM  7.5* 7.3* 7.6*  PHOS  --  7.4* 4.7*   Liver Function Tests: Recent Labs  Lab 12/14/23 1150 12/15/23 0412 12/16/23 0403  AST 34 43* 41  ALT 21 20 23   ALKPHOS 68 47 41  BILITOT 1.0 0.5 0.7  PROT 8.3* 6.5 6.8  ALBUMIN  2.2* 1.7* 1.6*   Recent Labs  Lab 12/14/23 1150  LIPASE 31   CBC: Recent Labs  Lab 12/14/23 1150 12/15/23 0412 12/16/23 0403 12/17/23 0502  WBC 1.7* 3.9* 5.0 5.3  NEUTROABS  --  3.4  --   --   HGB 12.4* 11.1* 10.2* 10.2*  HCT 39.2 34.8* 32.3* 32.1*  MCV 89.7 88.5 90.2 89.2  PLT 77* 64* 73* 86*   Medications:  cefTRIAXone  (ROCEPHIN )  IV 2 g (12/16/23 0753)    carvedilol   25 mg Oral BID   Chlorhexidine  Gluconate Cloth  6 each Topical Q0600   Chlorhexidine  Gluconate Cloth  6 each Topical Q0600   folic acid   1 mg Oral Daily    levothyroxine   50 mcg Oral QAC breakfast   lidocaine -EPINEPHrine   30 mL Infiltration Once   multivitamin with minerals  1 tablet Oral Daily   thiamine   100 mg Oral Daily   Or   thiamine   100 mg Intravenous Daily   Home bp meds: Coreg  25 bid Hydralazine 25 bid (not taking) Irbesartan  300 mg every day      OP HD: SW TTS 3h  B400  71kg   2K bath  AVF   Heparin  none   Labs -. Na 130, K= 5.6, BUN 46, creat 7.12, alb 1.6, wbc 5K, Hb 10.2   Assessment/ Plan: Abdominal pain: w/u in progress per ED, admitting team. CT showed findings suspicious for SBP. Per pmd. On ceftriaxone  and metronidazole .  ESRD: on HD TTS. Had HD 8/21 with no UF. Plan for HD 12/18/23 to try to keep him on his regular HD schedule.   HTN: is on 2 bp lowering meds at home, BP's well controlled today. Follow. Cont home meds.  Volume: CXR clear, no congestion/ edema. Has been having N/V for 2  days, needs updated standing weight.   Anemia of esrd: Hb 10.2, no esa needs. Follow.  BMD: phos better today. Phos 4.7. On renal diet with fluid restriction. Corrected Ca 9.5. Recurrent ascites: has been getting paracenteses since 2022. Had paracentesis 8/20 and they removed 1.6L.    Belvie Och, NP 12/17/2023, 1:33 PM  [facility]

## 2023-12-17 NOTE — Progress Notes (Signed)
 Progress Note   Patient: [name] FMW:[id number] DOB: 05-24-1970 DOA: 12/14/2023  DOS: the patient was seen and examined on 12/17/2023   Brief hospital course:  53 year old male with a history of end-stage renal disease on hemodialysis Tuesday Thursday Saturday, history of lupus, history of ascites without a diagnosis of cirrhosis, history of systolic congestive heart EF of 35 to 40% who presents to the ER with a sudden onset of abdominal pain that started last night    Assessment and Plan:   Acute metabolic encephalopathy-likely alcohol withdrawal - Patient appears very anxious, delirious, paranoid, even hallucinating.  Etiology unclear.  Differentials include delirium, alcohol withdrawal, metabolic etiology.  CBC and CMP improved from yesterday.  No acidemia.  Will order ammonia however appears unlikely to be contributory.  Discussion with sister bedside stating that patient does drink to cover pain from his lupus.  Although he has reduced his alcohol consumption, seems to be the leading cause of his acute encephalopathy.  Patient's sister adamant about attempting to walk him to rule out hospital delirium.  Conceded to her point to allow him to walk with her but we will also order CIWA protocol.  Monitor closely.  Spontaneous bacterial peritonitis - Initial peritoneal fluid collection noting greater than 3600 WBCs, 95% neutrophils.  Culture noting Klebsiella.  Continue ceftriaxone  (day 4), discontinue Flagyl .     Large volume ascites - IR paracentesis performed 8/20 with 1.8 L removed.     ESRD - HD TTS.  Nephrology on board following closely.   HFpEF - Appears euvolemic.  Volume per nephrology/HD/UF.   Hyperkalemia - Received 1 dose of p.o. Lokelma .   Hypertension - Coreg  on board.   Hypothyroidism - Synthroid    Chronic pancytopenia - Continue to monitor.  SCDs for DVT prophylaxis.   Systemic lupus erythematosus with lung involvement - Stable.  Holding  Plaquenil .   Subjective: Patient alert but much more confused this morning.  Seems to be very paranoid, refusing medications, stating people are talking about him and spraying chemicals in his room.  He is alert and conversant, denies pain, shortness of breath, nausea, vomiting.  Patient's sister presented later in the day was able to discuss about his delirium.  States that this had happened at a previous hospitalization around day 3 as well.  Consists of her and it may be hospital delirium but concedes that he does drink alcohol as well.  Admits that he drinks alcohol to help with his chronic lupus pain and states he has cut back previously.  Physical Exam:  Vitals:   12/16/23 2129 12/16/23 2214 12/17/23 0424 12/17/23 0756  BP: (!) 97/54 96/68 105/69 120/70  Pulse: 98 97 97 99  Resp: 17 19 19 18   Temp: 98.6 F (37 C) 98.6 F (37 C) 98.2 F (36.8 C) 98.1 F (36.7 C)  TempSrc: Oral Oral Oral   SpO2: 94% 93% 94% 97%  Weight:      Height:        GENERAL:  Alert, slightly agitated, disheveled HEENT:  EOMI CARDIOVASCULAR:  RRR, systolic murmur appreciated RESPIRATORY:  Clear to auscultation, no wheezing, rales, or rhonchi GASTROINTESTINAL: Distended, minimally tender EXTREMITIES: Thin, no LE edema bilaterally NEURO:  No new focal deficits appreciated SKIN:  No rashes noted PSYCH: Anxious, paranoid   Data Reviewed:  Imaging Studies: IR Paracentesis Result Date: 12/15/2023 INDICATION: 53 year old male. History of lupus, end-stage renal disease with recurrent ascites. Request for therapeutic paracentesis EXAM: ULTRASOUND GUIDED LEFT-SIDED THERAPEUTIC PARACENTESIS MEDICATIONS: Lidocaine  1% 10 mL  COMPLICATIONS: None immediate. PROCEDURE: Informed written consent was obtained from the patient after a discussion of the risks, benefits and alternatives to treatment. A timeout was performed prior to the initiation of the procedure. Initial ultrasound scanning demonstrates a small amount of  ascites within the right lower abdominal quadrant. The right lower abdomen was prepped and draped in the usual sterile fashion. 1% lidocaine  was used for local anesthesia. Following this, a 6 Fr Safe-T-Centesis catheter was introduced. An ultrasound image was saved for documentation purposes. The paracentesis was performed. The catheter was removed and a dressing was applied. The patient tolerated the procedure well without immediate post procedural complication. FINDINGS: A total of approximately 1.8 L of straw-colored fluid was removed. IMPRESSION: Successful ultrasound-guided therapeutic left-sided paracentesis yielding 1.8 liters of peritoneal fluid. Performed by Delon Beagle NP Electronically Signed   By: CHRISTELLA.  Shick M.D.   On: 12/15/2023 10:41   ECHOCARDIOGRAM COMPLETE Result Date: 12/15/2023    ECHOCARDIOGRAM REPORT   Patient Name:   [name] Date of Exam: 12/15/2023 Medical Rec #:  [id number]       Height:       71.0 in Accession #:    [id number]      Weight:       168.4 lb Date of Birth:  March 09, 1971       BSA:          1.960 m Patient Age:    53 years        BP:           107/74 mmHg Patient Gender: M               HR:           90 bpm. Exam Location:  Inpatient Procedure: 2D Echo, Cardiac Doppler and Color Doppler (Both Spectral and Color            Flow Doppler were utilized during procedure). Indications:    CHF Acute Systolic I50.21  History:        Patient has prior history of Echocardiogram examinations, most                 recent 04/28/2020. CHF.  Sonographer:    Tinnie Gosling RDCS Referring Phys: [phone] ERIC CHEN IMPRESSIONS  1. Left ventricular ejection fraction, by estimation, is 55 to 60%. The left ventricle has normal function. The left ventricle has no regional wall motion abnormalities. There is severe concentric left ventricular hypertrophy. Left ventricular diastolic  parameters are consistent with Grade I diastolic dysfunction (impaired relaxation).  2. Right ventricular systolic  function is normal. The right ventricular size is normal.  3. Left atrial size was moderately dilated.  4. Right atrial size was mildly dilated.  5. The mitral valve is normal in structure. Trivial mitral valve regurgitation. No evidence of mitral stenosis.  6. The aortic valve is tricuspid. There is mild calcification of the aortic valve. Aortic valve regurgitation is not visualized. Aortic valve sclerosis/calcification is present, without any evidence of aortic stenosis.  7. Aortic dilatation noted. There is mild dilatation of the aortic root, measuring 40 mm.  8. The inferior vena cava is dilated in size with >50% respiratory variability, suggesting right atrial pressure of 8 mmHg. FINDINGS  Left Ventricle: Left ventricular ejection fraction, by estimation, is 55 to 60%. The left ventricle has normal function. The left ventricle has no regional wall motion abnormalities. The left ventricular internal cavity size was normal in size. There is  severe concentric  left ventricular hypertrophy. Left ventricular diastolic parameters are consistent with Grade I diastolic dysfunction (impaired relaxation). Right Ventricle: The right ventricular size is normal. No increase in right ventricular wall thickness. Right ventricular systolic function is normal. Left Atrium: Left atrial size was moderately dilated. Right Atrium: Right atrial size was mildly dilated. Pericardium: There is no evidence of pericardial effusion. Mitral Valve: The mitral valve is normal in structure. Trivial mitral valve regurgitation. No evidence of mitral valve stenosis. MV peak gradient, 4.2 mmHg. The mean mitral valve gradient is 3.0 mmHg. Tricuspid Valve: The tricuspid valve is normal in structure. Tricuspid valve regurgitation is mild . No evidence of tricuspid stenosis. Aortic Valve: The aortic valve is tricuspid. There is mild calcification of the aortic valve. Aortic valve regurgitation is not visualized. Aortic valve sclerosis/calcification  is present, without any evidence of aortic stenosis. Pulmonic Valve: The pulmonic valve was normal in structure. Pulmonic valve regurgitation is trivial. No evidence of pulmonic stenosis. Aorta: Aortic dilatation noted. There is mild dilatation of the aortic root, measuring 40 mm. Venous: The inferior vena cava is dilated in size with greater than 50% respiratory variability, suggesting right atrial pressure of 8 mmHg. IAS/Shunts: No atrial level shunt detected by color flow Doppler.  LEFT VENTRICLE PLAX 2D LVIDd:         4.60 cm   Diastology LVIDs:         3.20 cm   LV e' lateral:   8.16 cm/s LV PW:         2.10 cm   LV E/e' lateral: 13.1 LV IVS:        2.00 cm LVOT diam:     2.10 cm LV SV:         79 LV SV Index:   40 LVOT Area:     3.46 cm  RIGHT VENTRICLE             IVC RV S prime:     14.30 cm/s  IVC diam: 2.30 cm TAPSE (M-mode): 2.1 cm LEFT ATRIUM             Index        RIGHT ATRIUM           Index LA diam:        3.70 cm 1.89 cm/m   RA Area:     18.20 cm LA Vol (A2C):   76.1 ml 38.82 ml/m  RA Volume:   44.90 ml  22.91 ml/m LA Vol (A4C):   91.2 ml 46.53 ml/m LA Biplane Vol: 93.0 ml 47.45 ml/m  AORTIC VALVE LVOT Vmax:   127.00 cm/s LVOT Vmean:  80.100 cm/s LVOT VTI:    0.229 m  AORTA Ao Root diam: 4.00 cm Ao Asc diam:  3.70 cm MITRAL VALVE MV Area (PHT): 4.65 cm     SHUNTS MV Area VTI:   3.34 cm     Systemic VTI:  0.23 m MV Peak grad:  4.2 mmHg     Systemic Diam: 2.10 cm MV Mean grad:  3.0 mmHg MV Vmax:       1.03 m/s MV Vmean:      83.0 cm/s MV Decel Time: 163 msec MV E velocity: 107.00 cm/s MV A velocity: 117.00 cm/s MV E/A ratio:  0.91 Toribio Fuel MD Electronically signed by Toribio Fuel MD Signature Date/Time: 12/15/2023/9:40:29 AM    Final    CT ABDOMEN PELVIS W CONTRAST Result Date: 12/14/2023 CLINICAL DATA:  Right lower quadrant abdominal pain for 2 days.  Nausea and vomiting. Hemodialysis patient. EXAM: CT ABDOMEN AND PELVIS WITH CONTRAST TECHNIQUE: Multidetector CT imaging of the  abdomen and pelvis was performed using the standard protocol following bolus administration of intravenous contrast. RADIATION DOSE REDUCTION: This exam was performed according to the departmental dose-optimization program which includes automated exposure control, adjustment of the mA and/or kV according to patient size and/or use of iterative reconstruction technique. CONTRAST:  75mL OMNIPAQUE  IOHEXOL  350 MG/ML SOLN COMPARISON:  Abdominopelvic CT 08/04/2021 and 06/29/2021. FINDINGS: Lower chest: Interval improved aeration of the left lung base with mild residual atelectasis or scarring. No confluent airspace disease or significant pleural effusion. Moderate cardiomegaly without significant pericardial fluid. There is coronary artery atherosclerosis with calcifications of the mitral annulus. Hepatobiliary: The liver is normal in density without suspicious focal abnormality. No evidence of gallstones, gallbladder wall thickening or biliary dilatation. Pancreas: Unremarkable. No pancreatic ductal dilatation or surrounding inflammatory changes. Spleen: Normal in size without focal abnormality. Adrenals/Urinary Tract: Both adrenal glands appear normal. Moderate to severe renal cortical thinning and atrophy bilaterally, progressive from previous examination. No significant contrast excretion is demonstrated on the delayed images. There is no hydronephrosis or urinary tract calculus. The bladder appears unremarkable for its degree of distention. Stomach/Bowel: No enteric contrast administered. The stomach appears unremarkable for its degree of distention. Mild small bowel wall thickening with mucosal hyperenhancement in the mid abdomen. The appendix is not clearly seen on the current study, although there is no pericecal inflammation to suggest appendicitis. Mild diffuse colonic wall thickening. There are diverticular changes throughout the descending and sigmoid colon without focal surrounding inflammation. No evidence  of bowel obstruction or perforation. Vascular/Lymphatic: Scattered prominent retroperitoneal lymph nodes, similar to previous study and likely reactive. Aortic and branch vessel atherosclerosis without evidence of aneurysm or large vessel occlusion. The portal, superior mesenteric and splenic veins are patent. Reproductive: The prostate gland and seminal vesicles appear unremarkable. Other: Moderate to large volume ascites again noted, similar in overall volume to the previous CT. There is apparent new diffuse peritoneal thickening without apparent nodularity. A large amount of fluid extends into the fall canal and screw, incompletely visualized but increased in volume from the previous study. No herniated bowel, organized fluid collection or pneumoperitoneum. Musculoskeletal: Chronic femoral head osteonecrosis bilaterally without subchondral collapse or significant secondary degenerative changes. Mild lumbar spondylosis. No acute osseous findings are identified. Generalized subcutaneous edema appears similar to previous CT. IMPRESSION: 1. Moderate to large volume ascites, similar in overall volume to the previous CT. There is new diffuse smooth peritoneal thickening without apparent nodularity, suspicious for peritonitis. Consider diagnostic paracentesis. 2. Mild small bowel and colonic wall thickening, nonspecific in the setting of ascites. No evidence of bowel obstruction or perforation. The appendix is not discretely visualized, although there are no specific signs of appendicitis. 3. Progressive renal cortical thinning and atrophy bilaterally. No hydronephrosis or urinary tract calculus. 4. Chronic femoral head osteonecrosis bilaterally without subchondral collapse or significant secondary degenerative changes. 5.  Aortic Atherosclerosis (ICD10-I70.0). Electronically Signed   By: Elsie Perone M.D.   On: 12/14/2023 14:56   DG Chest 2 View Result Date: 12/14/2023 CLINICAL DATA:  SHOB EXAM: CHEST - 2 VIEW  COMPARISON:  June 29, 2021 FINDINGS: Lower lung volumes . Bilateral perihilar interstitial opacities. No pneumothorax. Trace left pleural effusion. Mild cardiomegaly. Tortuous aorta with aortic atherosclerosis. No acute fracture or destructive lesions. Multilevel thoracic osteophytosis. IMPRESSION: Bilateral perihilar interstitial opacities, which may represent bronchovascular crowding due to low lung volumes, interstitial edema, or atypical/viral  infection, in the correct clinical context. Electronically Signed   By: Rogelia Myers M.D.   On: 12/14/2023 13:09    There are no new results to review at this time.  Previous records (including but not limited to H&P, progress notes, nursing notes, TOC management) were reviewed in assessment of this patient.  Labs: CBC: Recent Labs  Lab 12/14/23 1150 12/15/23 0412 12/16/23 0403 12/17/23 0502  WBC 1.7* 3.9* 5.0 5.3  NEUTROABS  --  3.4  --   --   HGB 12.4* 11.1* 10.2* 10.2*  HCT 39.2 34.8* 32.3* 32.1*  MCV 89.7 88.5 90.2 89.2  PLT 77* 64* 73* 86*   Basic Metabolic Panel: Recent Labs  Lab 12/14/23 1150 12/15/23 0412 12/16/23 0403 12/17/23 0502  NA 133* 132* 130* 131*  K 5.5* 4.6 5.6* 4.3  CL 95* 94* 90* 94*  CO2 24 23 24 24   GLUCOSE 85 51* 112* 77  BUN 56* 36* 46* 28*  CREATININE 7.90* 5.94* 7.12* 4.97*  CALCIUM  8.3* 7.5* 7.3* 7.6*  MG  --   --  1.7 1.7  PHOS  --   --  7.4* 4.7*   Liver Function Tests: Recent Labs  Lab 12/14/23 1150 12/15/23 0412 12/16/23 0403  AST 34 43* 41  ALT 21 20 23   ALKPHOS 68 47 41  BILITOT 1.0 0.5 0.7  PROT 8.3* 6.5 6.8  ALBUMIN  2.2* 1.7* 1.6*   CBG: No results for input(s): GLUCAP in the last 168 hours.  Scheduled Meds:  carvedilol   25 mg Oral BID   Chlorhexidine  Gluconate Cloth  6 each Topical Q0600   Chlorhexidine  Gluconate Cloth  6 each Topical Q0600   folic acid   1 mg Oral Daily   levothyroxine   50 mcg Oral QAC breakfast   lidocaine -EPINEPHrine   30 mL Infiltration Once    multivitamin with minerals  1 tablet Oral Daily   thiamine   100 mg Oral Daily   Or   thiamine   100 mg Intravenous Daily   Continuous Infusions:  cefTRIAXone  (ROCEPHIN )  IV 2 g (12/16/23 0753)   PRN Meds:.acetaminophen  **OR** acetaminophen , albuterol , LORazepam  **OR** LORazepam , melatonin, ondansetron  **OR** ondansetron  (ZOFRAN ) IV  Family Communication: Sister at bedside  Disposition: Status is: Inpatient Remains inpatient appropriate because: Encephalopathy, ESRD, SBP     Time spent: 56 minutes  Length of inpatient stay: 3 days  Author: Carliss LELON Canales, DO 12/17/2023 1:37 PM  For on call review [email or web address].

## 2023-12-17 NOTE — Progress Notes (Signed)
 Safety sitter ordered and this nurse to give ativan  IV per CIWA ordered per Dr. Charlton. Pharmacy notified for injectable form of ativan  due to unavailability on floor. Charge nurse notified of new order for sitter. Patient assisted back to bed by staff. Bed alarm intact with fall matts in place.

## 2023-12-18 DIAGNOSIS — K652 Spontaneous bacterial peritonitis: Secondary | ICD-10-CM | POA: Diagnosis not present

## 2023-12-18 DIAGNOSIS — G9341 Metabolic encephalopathy: Secondary | ICD-10-CM | POA: Diagnosis not present

## 2023-12-18 DIAGNOSIS — F10931 Alcohol use, unspecified with withdrawal delirium: Secondary | ICD-10-CM | POA: Diagnosis not present

## 2023-12-18 DIAGNOSIS — R188 Other ascites: Secondary | ICD-10-CM | POA: Diagnosis not present

## 2023-12-18 MED ORDER — POLYETHYLENE GLYCOL 3350 17 G PO PACK
17.0000 g | PACK | Freq: Every day | ORAL | Status: DC
  Administered 2023-12-22 – 2023-12-25 (×3): 17 g via ORAL
  Filled 2023-12-18 (×8): qty 1

## 2023-12-18 MED ORDER — BISACODYL 10 MG RE SUPP: 10.0000 mg | Freq: Every day | RECTAL | Status: DC | PRN

## 2023-12-18 MED ORDER — BISACODYL 5 MG PO TBEC
5.0000 mg | DELAYED_RELEASE_TABLET | Freq: Every day | ORAL | Status: DC | PRN
  Administered 2023-12-22: 5 mg via ORAL
  Filled 2023-12-18: qty 1

## 2023-12-18 NOTE — Progress Notes (Signed)
 Update: Pt resting soundly. BP-83/61 MAP-69 upon initial V/S check after IM administration. BP-116/77 MAP-88 at this time. CT head completed at this time. Dr. Charlton made aware.

## 2023-12-18 NOTE — Progress Notes (Signed)
  [facility] Progress Note   Subjective:  Seen in room. Overnight events noted. Sitter at bedside. In restraints/mittens. For dialysis today.   Objective Vitals:   12/17/23 2246 12/18/23 0433 12/18/23 1025 12/18/23 1042  BP: 116/77 96/81 120/70 128/79  Pulse: 92 98 97 96  Resp: 16 16 17  (!) 29  Temp: 98.8 F (37.1 C)  98.4 F (36.9 C)   TempSrc: Oral     SpO2: 96% 94% 100% 100%  Weight:   71.1 kg   Height:       Physical Exam Gen: Sedated in bed, restrained Pulm: Normal wob CV: RRR Abd: soft, non-tender Ext: 1+ LE edema Access: LUE AVF +bruit.   Additional Objective Labs: Basic Metabolic Panel: Recent Labs  Lab 12/15/23 0412 12/16/23 0403 12/17/23 0502  NA 132* 130* 131*  K 4.6 5.6* 4.3  CL 94* 90* 94*  CO2 23 24 24   GLUCOSE 51* 112* 77  BUN 36* 46* 28*  CREATININE 5.94* 7.12* 4.97*  CALCIUM  7.5* 7.3* 7.6*  PHOS  --  7.4* 4.7*   Liver Function Tests: Recent Labs  Lab 12/14/23 1150 12/15/23 0412 12/16/23 0403  AST 34 43* 41  ALT 21 20 23   ALKPHOS 68 47 41  BILITOT 1.0 0.5 0.7  PROT 8.3* 6.5 6.8  ALBUMIN  2.2* 1.7* 1.6*   Recent Labs  Lab 12/14/23 1150  LIPASE 31   CBC: Recent Labs  Lab 12/14/23 1150 12/15/23 0412 12/16/23 0403 12/17/23 0502  WBC 1.7* 3.9* 5.0 5.3  NEUTROABS  --  3.4  --   --   HGB 12.4* 11.1* 10.2* 10.2*  HCT 39.2 34.8* 32.3* 32.1*  MCV 89.7 88.5 90.2 89.2  PLT 77* 64* 73* 86*   Medications:  cefTRIAXone  (ROCEPHIN )  IV 2 g (12/16/23 0753)    carvedilol   25 mg Oral BID   Chlorhexidine  Gluconate Cloth  6 each Topical Q0600   Chlorhexidine  Gluconate Cloth  6 each Topical Q0600   folic acid   1 mg Oral Daily   levothyroxine   50 mcg Oral QAC breakfast   lidocaine -EPINEPHrine   30 mL Infiltration Once   multivitamin with minerals  1 tablet Oral Daily   thiamine   100 mg Oral Daily   Or   thiamine   100 mg Intravenous Daily   Home bp meds: Coreg  25 bid Hydralazine 25 bid (not taking) Irbesartan  300 mg  every day      OP HD: SW TTS 3h  B400  71kg   2K bath  AVF   Heparin  none     Assessment/ Plan: Peritonitis.  CT showed findings suspicious for SBP. Fluid cx + Klebsiella. On ceftriaxone .  Acute encephalopathy. c/w alcohol withdrawal. On CIWA protocol  ESRD: HD TTS. HD today  HTN. BP acceptable Continue home meds.  Volume: CXR clear, no congestion/ edema. At dry weight.  Anemia. Hgb 10.  no esa needs. Follow.  BMD: Ca/phos acceptable. Follow trends.  Recurrent ascites: has been getting paracenteses since 2022. Had paracentesis 8/20 and they removed 1.6L.   Maisie Ronnald Acosta PA-C  [facility] 12/18/2023,11:01 AM

## 2023-12-18 NOTE — Plan of Care (Signed)
?  Problem: Skin Integrity: ?Goal: Risk for impaired skin integrity will decrease ?Outcome: Progressing ?  ?Problem: Safety: ?Goal: Non-violent Restraint(s) ?Outcome: Progressing ?  ?Problem: Safety: ?Goal: Non-violent Restraint(s) ?Outcome: Progressing ?  ?

## 2023-12-18 NOTE — Progress Notes (Signed)
 Received patient in bed to unit.  Alert and oriented.  Informed consent signed and in chart.   TX duration: 3 hours and 15 minutes.  Soft restraint used due to Dialysis protocol, patient is altered and moved his left access arm a lot.  Restraint taken off at end of dialysis session.  Patient tolerated well.  Transported back to the room  Alert, without acute distress.  Hand-off given to patient's nurse.   Access used: Left Forearm AV fistula Access issues: none  Total UF removed: 3L Medication(s) given: none   12/18/23 1450  Vitals  Temp 98.7 F (37.1 C)  Temp Source Axillary  BP 97/67  MAP (mmHg) 70  ECG Heart Rate 94  Resp 18  Oxygen Therapy  SpO2 100 %  O2 Device Room Air  Patient Activity (if Appropriate) In bed  Pulse Oximetry Type Continuous  During Treatment Monitoring  Dialysate Potassium Concentration 3  Dialysate Calcium  Concentration 2.5  Duration of HD Treatment -hour(s) 3.25 hour(s)  HD Safety Checks Performed Yes  Intra-Hemodialysis Comments Tolerated well;Tx completed (soft restraint taken off)  Post Treatment  Dialyzer Clearance Clear  Liters Processed 60.5  Fluid Removed (mL) 3000 mL  Tolerated HD Treatment No (Comment)  Post-Hemodialysis Comments Patient needed a soft restraint due to him moving his Left access arm.  BFR to 300 due to high arterial pressures.  AVG/AVF Arterial Site Held (minutes) 7 minutes  AVG/AVF Venous Site Held (minutes) 7 minutes  Fistula / Graft Left Forearm Arteriovenous fistula  Placement Date/Time: 08/26/20 0807   Orientation: Left  Access Location: Forearm  Access Type: Arteriovenous fistula  Status Deaccessed     Camellia Brasil LPN [facility]

## 2023-12-18 NOTE — Progress Notes (Signed)
 Patient returns to unit at this time. Sitter, D, remains with patient. Patient is noted with lap belt in place, bilateral soft mittens on hands. Patient is seen with eyes closed but bilaterally waving arms near face.

## 2023-12-18 NOTE — Progress Notes (Signed)
 Progress Note   Patient: [name] FMW:[id number] DOB: 1970-07-21 DOA: 12/14/2023  DOS: the patient was seen and examined on 12/18/2023   Brief hospital course:  53 year old male with a history of end-stage renal disease on hemodialysis Tuesday Thursday Saturday, history of lupus, history of ascites without a diagnosis of cirrhosis, history of systolic congestive heart EF of 35 to 40% who presents to the ER with a sudden onset of abdominal pain that started last night    Assessment and Plan:   Acute metabolic encephalopathy-likely alcohol withdrawal - Beginning 8/22 patient appeared very anxious, delirious, paranoid, even hallucinating.  Etiology unclear.  Differentials include delirium, alcohol withdrawal, metabolic etiology.  Ammonia normal.  Discussion with sister bedside stating that patient does drink to cover pain from his lupus.  Although he has reduced his alcohol consumption, seems to be the leading cause of his acute encephalopathy.  Currently on CIWA protocol.  Appears mildly improved today.  Currently has mitts and sitter due to agitation.  Unwitnessed fall - Apparently patient became delirious and walked in the hallway and had an unwitnessed fall floor.  CT showing no intracranial abnormalities.  Patient was very confused, refusing medications.  Subsequently ordered IM benzodiazepine, soft restraints, sitter.   Spontaneous bacterial peritonitis - Initial peritoneal fluid collection noting greater than 3600 WBCs, 95% neutrophils.  Culture noting Klebsiella.  Continue ceftriaxone  (day 5), discontinue Flagyl .     Large volume ascites - IR paracentesis performed 8/20 with 1.8 L removed.     ESRD - HD TTS.  Nephrology on board following closely.   HFpEF - Appears euvolemic.  Volume per nephrology/HD/UF.   Hyperkalemia - Received 1 dose of p.o. Lokelma .  Managed via HD.   Hypertension - Coreg  on board.   Hypothyroidism - TSH 7.89.  Initiated on Synthroid  50 micrograms  daily.  Chronic pancytopenia - Continue to monitor.  SCDs for DVT prophylaxis.   Systemic lupus erythematosus with lung involvement - Stable.  Holding Plaquenil .   Subjective: Patient resting comfortably this morning.  Lethargic but rousable.  Denies pain, shortness of breath, nausea, vomiting.  A&O x 2, thought he was at home.  Scheduled for dialysis later today.  Physical Exam:  Vitals:   12/18/23 1113 12/18/23 1130 12/18/23 1145 12/18/23 1200  BP: 131/74 123/78 105/72 112/71  Pulse: 93 92 92 95  Resp: 16 16 15 16   Temp:      TempSrc:      SpO2: 98% 100% 97% 98%  Weight:      Height:        GENERAL:  Alert, lethargic but rousable, disheveled HEENT:  EOMI CARDIOVASCULAR:  RRR, systolic murmur appreciated RESPIRATORY:  Clear to auscultation, no wheezing, rales, or rhonchi GASTROINTESTINAL: Distended, nontender EXTREMITIES: Thin, no LE edema bilaterally NEURO:  No new focal deficits appreciated SKIN:  No rashes noted PSYCH: Anxious, paranoid   Data Reviewed:  Imaging Studies: CT HEAD WO CONTRAST ( ) Result Date: 12/18/2023 CLINICAL DATA:  Head trauma, coagulopathy (Age 60-64y) EXAM: CT HEAD WITHOUT CONTRAST TECHNIQUE: Contiguous axial images were obtained from the base of the skull through the vertex without intravenous contrast. RADIATION DOSE REDUCTION: This exam was performed according to the departmental dose-optimization program which includes automated exposure control, adjustment of the mA and/or kV according to patient size and/or use of iterative reconstruction technique. COMPARISON:  None Available. FINDINGS: Mildly motion limited study. Brain: No evidence of acute infarction, hemorrhage, hydrocephalus, extra-axial collection or mass lesion/mass effect. Vascular: No hyperdense vessel. Skull: No acute  fracture. Sinuses/Orbits: Clear sinuses.  No acute orbital findings. Other: No mastoid effusions. IMPRESSION: No evidence of acute intracranial abnormality.  Electronically Signed   By: Gilmore GORMAN Molt M.D.   On: 12/18/2023 00:43   IR Paracentesis Result Date: 12/15/2023 INDICATION: 53 year old male. History of lupus, end-stage renal disease with recurrent ascites. Request for therapeutic paracentesis EXAM: ULTRASOUND GUIDED LEFT-SIDED THERAPEUTIC PARACENTESIS MEDICATIONS: Lidocaine  1% 10 mL COMPLICATIONS: None immediate. PROCEDURE: Informed written consent was obtained from the patient after a discussion of the risks, benefits and alternatives to treatment. A timeout was performed prior to the initiation of the procedure. Initial ultrasound scanning demonstrates a small amount of ascites within the right lower abdominal quadrant. The right lower abdomen was prepped and draped in the usual sterile fashion. 1% lidocaine  was used for local anesthesia. Following this, a 6 Fr Safe-T-Centesis catheter was introduced. An ultrasound image was saved for documentation purposes. The paracentesis was performed. The catheter was removed and a dressing was applied. The patient tolerated the procedure well without immediate post procedural complication. FINDINGS: A total of approximately 1.8 L of straw-colored fluid was removed. IMPRESSION: Successful ultrasound-guided therapeutic left-sided paracentesis yielding 1.8 liters of peritoneal fluid. Performed by Delon Beagle NP Electronically Signed   By: CHRISTELLA.  Shick M.D.   On: 12/15/2023 10:41   ECHOCARDIOGRAM COMPLETE Result Date: 12/15/2023    ECHOCARDIOGRAM REPORT   Patient Name:   [name] Date of Exam: 12/15/2023 Medical Rec #:  [id number]       Height:       71.0 in Accession #:    [id number]      Weight:       168.4 lb Date of Birth:  30-Mar-1971       BSA:          1.960 m Patient Age:    53 years        BP:           107/74 mmHg Patient Gender: M               HR:           90 bpm. Exam Location:  Inpatient Procedure: 2D Echo, Cardiac Doppler and Color Doppler (Both Spectral and Color            Flow Doppler were  utilized during procedure). Indications:    CHF Acute Systolic I50.21  History:        Patient has prior history of Echocardiogram examinations, most                 recent 04/28/2020. CHF.  Sonographer:    Tinnie Gosling RDCS Referring Phys: [phone] ERIC CHEN IMPRESSIONS  1. Left ventricular ejection fraction, by estimation, is 55 to 60%. The left ventricle has normal function. The left ventricle has no regional wall motion abnormalities. There is severe concentric left ventricular hypertrophy. Left ventricular diastolic  parameters are consistent with Grade I diastolic dysfunction (impaired relaxation).  2. Right ventricular systolic function is normal. The right ventricular size is normal.  3. Left atrial size was moderately dilated.  4. Right atrial size was mildly dilated.  5. The mitral valve is normal in structure. Trivial mitral valve regurgitation. No evidence of mitral stenosis.  6. The aortic valve is tricuspid. There is mild calcification of the aortic valve. Aortic valve regurgitation is not visualized. Aortic valve sclerosis/calcification is present, without any evidence of aortic stenosis.  7. Aortic dilatation noted. There is mild dilatation of the aortic root,  measuring 40 mm.  8. The inferior vena cava is dilated in size with >50% respiratory variability, suggesting right atrial pressure of 8 mmHg. FINDINGS  Left Ventricle: Left ventricular ejection fraction, by estimation, is 55 to 60%. The left ventricle has normal function. The left ventricle has no regional wall motion abnormalities. The left ventricular internal cavity size was normal in size. There is  severe concentric left ventricular hypertrophy. Left ventricular diastolic parameters are consistent with Grade I diastolic dysfunction (impaired relaxation). Right Ventricle: The right ventricular size is normal. No increase in right ventricular wall thickness. Right ventricular systolic function is normal. Left Atrium: Left atrial size was  moderately dilated. Right Atrium: Right atrial size was mildly dilated. Pericardium: There is no evidence of pericardial effusion. Mitral Valve: The mitral valve is normal in structure. Trivial mitral valve regurgitation. No evidence of mitral valve stenosis. MV peak gradient, 4.2 mmHg. The mean mitral valve gradient is 3.0 mmHg. Tricuspid Valve: The tricuspid valve is normal in structure. Tricuspid valve regurgitation is mild . No evidence of tricuspid stenosis. Aortic Valve: The aortic valve is tricuspid. There is mild calcification of the aortic valve. Aortic valve regurgitation is not visualized. Aortic valve sclerosis/calcification is present, without any evidence of aortic stenosis. Pulmonic Valve: The pulmonic valve was normal in structure. Pulmonic valve regurgitation is trivial. No evidence of pulmonic stenosis. Aorta: Aortic dilatation noted. There is mild dilatation of the aortic root, measuring 40 mm. Venous: The inferior vena cava is dilated in size with greater than 50% respiratory variability, suggesting right atrial pressure of 8 mmHg. IAS/Shunts: No atrial level shunt detected by color flow Doppler.  LEFT VENTRICLE PLAX 2D LVIDd:         4.60 cm   Diastology LVIDs:         3.20 cm   LV e' lateral:   8.16 cm/s LV PW:         2.10 cm   LV E/e' lateral: 13.1 LV IVS:        2.00 cm LVOT diam:     2.10 cm LV SV:         79 LV SV Index:   40 LVOT Area:     3.46 cm  RIGHT VENTRICLE             IVC RV S prime:     14.30 cm/s  IVC diam: 2.30 cm TAPSE (M-mode): 2.1 cm LEFT ATRIUM             Index        RIGHT ATRIUM           Index LA diam:        3.70 cm 1.89 cm/m   RA Area:     18.20 cm LA Vol (A2C):   76.1 ml 38.82 ml/m  RA Volume:   44.90 ml  22.91 ml/m LA Vol (A4C):   91.2 ml 46.53 ml/m LA Biplane Vol: 93.0 ml 47.45 ml/m  AORTIC VALVE LVOT Vmax:   127.00 cm/s LVOT Vmean:  80.100 cm/s LVOT VTI:    0.229 m  AORTA Ao Root diam: 4.00 cm Ao Asc diam:  3.70 cm MITRAL VALVE MV Area (PHT): 4.65 cm      SHUNTS MV Area VTI:   3.34 cm     Systemic VTI:  0.23 m MV Peak grad:  4.2 mmHg     Systemic Diam: 2.10 cm MV Mean grad:  3.0 mmHg MV Vmax:       1.03  m/s MV Vmean:      83.0 cm/s MV Decel Time: 163 msec MV E velocity: 107.00 cm/s MV A velocity: 117.00 cm/s MV E/A ratio:  0.91 Toribio Fuel MD Electronically signed by Toribio Fuel MD Signature Date/Time: 12/15/2023/9:40:29 AM    Final    CT ABDOMEN PELVIS W CONTRAST Result Date: 12/14/2023 CLINICAL DATA:  Right lower quadrant abdominal pain for 2 days. Nausea and vomiting. Hemodialysis patient. EXAM: CT ABDOMEN AND PELVIS WITH CONTRAST TECHNIQUE: Multidetector CT imaging of the abdomen and pelvis was performed using the standard protocol following bolus administration of intravenous contrast. RADIATION DOSE REDUCTION: This exam was performed according to the departmental dose-optimization program which includes automated exposure control, adjustment of the mA and/or kV according to patient size and/or use of iterative reconstruction technique. CONTRAST:  75mL OMNIPAQUE  IOHEXOL  350 MG/ML SOLN COMPARISON:  Abdominopelvic CT 08/04/2021 and 06/29/2021. FINDINGS: Lower chest: Interval improved aeration of the left lung base with mild residual atelectasis or scarring. No confluent airspace disease or significant pleural effusion. Moderate cardiomegaly without significant pericardial fluid. There is coronary artery atherosclerosis with calcifications of the mitral annulus. Hepatobiliary: The liver is normal in density without suspicious focal abnormality. No evidence of gallstones, gallbladder wall thickening or biliary dilatation. Pancreas: Unremarkable. No pancreatic ductal dilatation or surrounding inflammatory changes. Spleen: Normal in size without focal abnormality. Adrenals/Urinary Tract: Both adrenal glands appear normal. Moderate to severe renal cortical thinning and atrophy bilaterally, progressive from previous examination. No significant contrast  excretion is demonstrated on the delayed images. There is no hydronephrosis or urinary tract calculus. The bladder appears unremarkable for its degree of distention. Stomach/Bowel: No enteric contrast administered. The stomach appears unremarkable for its degree of distention. Mild small bowel wall thickening with mucosal hyperenhancement in the mid abdomen. The appendix is not clearly seen on the current study, although there is no pericecal inflammation to suggest appendicitis. Mild diffuse colonic wall thickening. There are diverticular changes throughout the descending and sigmoid colon without focal surrounding inflammation. No evidence of bowel obstruction or perforation. Vascular/Lymphatic: Scattered prominent retroperitoneal lymph nodes, similar to previous study and likely reactive. Aortic and branch vessel atherosclerosis without evidence of aneurysm or large vessel occlusion. The portal, superior mesenteric and splenic veins are patent. Reproductive: The prostate gland and seminal vesicles appear unremarkable. Other: Moderate to large volume ascites again noted, similar in overall volume to the previous CT. There is apparent new diffuse peritoneal thickening without apparent nodularity. A large amount of fluid extends into the fall canal and screw, incompletely visualized but increased in volume from the previous study. No herniated bowel, organized fluid collection or pneumoperitoneum. Musculoskeletal: Chronic femoral head osteonecrosis bilaterally without subchondral collapse or significant secondary degenerative changes. Mild lumbar spondylosis. No acute osseous findings are identified. Generalized subcutaneous edema appears similar to previous CT. IMPRESSION: 1. Moderate to large volume ascites, similar in overall volume to the previous CT. There is new diffuse smooth peritoneal thickening without apparent nodularity, suspicious for peritonitis. Consider diagnostic paracentesis. 2. Mild small bowel  and colonic wall thickening, nonspecific in the setting of ascites. No evidence of bowel obstruction or perforation. The appendix is not discretely visualized, although there are no specific signs of appendicitis. 3. Progressive renal cortical thinning and atrophy bilaterally. No hydronephrosis or urinary tract calculus. 4. Chronic femoral head osteonecrosis bilaterally without subchondral collapse or significant secondary degenerative changes. 5.  Aortic Atherosclerosis (ICD10-I70.0). Electronically Signed   By: Elsie Perone M.D.   On: 12/14/2023 14:56   DG Chest 2  View Result Date: 12/14/2023 CLINICAL DATA:  SHOB EXAM: CHEST - 2 VIEW COMPARISON:  June 29, 2021 FINDINGS: Lower lung volumes . Bilateral perihilar interstitial opacities. No pneumothorax. Trace left pleural effusion. Mild cardiomegaly. Tortuous aorta with aortic atherosclerosis. No acute fracture or destructive lesions. Multilevel thoracic osteophytosis. IMPRESSION: Bilateral perihilar interstitial opacities, which may represent bronchovascular crowding due to low lung volumes, interstitial edema, or atypical/viral infection, in the correct clinical context. Electronically Signed   By: Rogelia Myers M.D.   On: 12/14/2023 13:09    There are no new results to review at this time.  Previous records (including but not limited to H&P, progress notes, nursing notes, TOC management) were reviewed in assessment of this patient.  Labs: CBC: Recent Labs  Lab 12/14/23 1150 12/15/23 0412 12/16/23 0403 12/17/23 0502  WBC 1.7* 3.9* 5.0 5.3  NEUTROABS  --  3.4  --   --   HGB 12.4* 11.1* 10.2* 10.2*  HCT 39.2 34.8* 32.3* 32.1*  MCV 89.7 88.5 90.2 89.2  PLT 77* 64* 73* 86*   Basic Metabolic Panel: Recent Labs  Lab 12/14/23 1150 12/15/23 0412 12/16/23 0403 12/17/23 0502  NA 133* 132* 130* 131*  K 5.5* 4.6 5.6* 4.3  CL 95* 94* 90* 94*  CO2 24 23 24 24   GLUCOSE 85 51* 112* 77  BUN 56* 36* 46* 28*  CREATININE 7.90* 5.94* 7.12*  4.97*  CALCIUM  8.3* 7.5* 7.3* 7.6*  MG  --   --  1.7 1.7  PHOS  --   --  7.4* 4.7*   Liver Function Tests: Recent Labs  Lab 12/14/23 1150 12/15/23 0412 12/16/23 0403  AST 34 43* 41  ALT 21 20 23   ALKPHOS 68 47 41  BILITOT 1.0 0.5 0.7  PROT 8.3* 6.5 6.8  ALBUMIN  2.2* 1.7* 1.6*   CBG: Recent Labs  Lab 12/17/23 2014  GLUCAP 79    Scheduled Meds:  carvedilol   25 mg Oral BID   Chlorhexidine  Gluconate Cloth  6 each Topical Q0600   Chlorhexidine  Gluconate Cloth  6 each Topical Q0600   folic acid   1 mg Oral Daily   levothyroxine   50 mcg Oral QAC breakfast   lidocaine -EPINEPHrine   30 mL Infiltration Once   multivitamin with minerals  1 tablet Oral Daily   thiamine   100 mg Oral Daily   Or   thiamine   100 mg Intravenous Daily   Continuous Infusions:  cefTRIAXone  (ROCEPHIN )  IV Stopped (12/18/23 0800)   PRN Meds:.acetaminophen  **OR** acetaminophen , albuterol , diazepam  **OR** diazepam , melatonin, ondansetron  **OR** ondansetron  (ZOFRAN ) IV  Family Communication: None at bedside  Disposition: Status is: Inpatient Remains inpatient appropriate because: Encephalopathy, ESRD, SBP     Time spent: 51 minutes  Length of inpatient stay: 4 days  Author: Carliss LELON Canales, DO 12/18/2023 1:31 PM  For on call review [email or web address].

## 2023-12-18 NOTE — Progress Notes (Signed)
 This nurse bedside administering missed medications, assessing patient, and assisting with cleaning patient. Patient's sister arrives bedside with outside food. Visitor begins questioning previously given medications, this nurse looks over patient's chart and explains the medication previously given. Sister then asks if provider can come bedside or call. Dr. Rosetta notified. Visitor then questions the indication for antibiotic that this nurse begins to administer. This nurse educations visitor on current need for antibiotic. Visitor instructs this nurse to not administer the medication. Dr. Arlon notified via secure chat. Visitor then questions the restraints remaining on the patient. This nurse explains the actions of the patient that indicates why he remains in bilateral soft wrist restraints. The visitor then states and proceeds to remove all restraints for the patient. Dr. Arlon notified, and on phone with visitor.

## 2023-12-18 NOTE — Progress Notes (Signed)
 This nurse to bedside as safety sitter is standing outside of room speaking with her supervisors. Visitor states she does not want safety sitter in room at this. Per Engineer, drilling, Recruitment consultant is remains in direct view of patient. Safety sitter is now outside of the room, with door open, and in direct vision of patient.

## 2023-12-18 NOTE — Plan of Care (Signed)
 Dhillon was taken to and completed dialysis today. Upon his return to the unit he remained disoriented and lethargy/drowsy. He is alert to his name but speaks incomprehensible verbiage. The patient still seems to be experiencing visual hallucinations as he continues to drab and reach for intangible objects. Patient's sister removed bilateral soft mitts and requested that the safety sitter be removed. Therefore patient may remain at risk of safety concerns. Patient's infection precautions remains. Patient missed IV antibiotics today. Teaching, Activities, nor discharge planning increased in progress as patient remains disoriented and patient's visitor remains obstructive in care.

## 2023-12-18 NOTE — Progress Notes (Signed)
   12/17/23 2010  What Happened  Was fall witnessed? No  Was patient injured? Unsure  Patient found on floor;in hallway  Found by Staff-comment HOWARD Silversmith, Phylicia)  Stated prior activity ambulating-unassisted  Provider Notification  Provider Name/Title Dr. Charlton  Date Provider Notified 12/17/23  Time Provider Notified 2018  Method of Notification  (secure chat)  Notification Reason Fall  Provider response See new orders;Other (Comment) (will come to unit to assess)  Date of Provider Response 12/17/23  Time of Provider Response 2022  Follow Up  Family notified Yes - comment  Time family notified 2045  Additional tests Yes-comment  Progress note created (see row info) Yes  Adult Fall Risk Assessment  Risk Factor Category (scoring not indicated) Not Applicable  Age 53  Fall History: Fall within 6 months prior to admission 0  Elimination; Bowel and/or Urine Incontinence 0  Elimination; Bowel and/or Urine Urgency/Frequency 0  Medications: includes PCA/Opiates, Anti-convulsants, Anti-hypertensives, Diuretics, Hypnotics, Laxatives, Sedatives, and Psychotropics 5  Patient Care Equipment 0  Mobility-Assistance 2  Mobility-Gait 2  Mobility-Sensory Deficit 0  Altered awareness of immediate physical environment 1  Impulsiveness 2  Lack of understanding of one's physical/cognitive limitations 4  Total Score 16  Adult Fall Risk Interventions  Required Bundle Interventions *See Row Information* High fall risk - low, moderate, and high requirements implemented  Additional Interventions Reorient/diversional activities with confused patients;Use of appropriate toileting equipment (bedpan, BSC, etc.)  Screening for Fall Injury Risk (To be completed on HIGH fall risk patients) - Assessing Need for Floor Mats  Risk For Fall Injury- Criteria for Floor Mats Confusion/dementia (+NuDESC, CIWA, TBI, etc.);Noncompliant with safety precautions  Will Implement Floor Mats Yes

## 2023-12-18 NOTE — Progress Notes (Signed)
 Safety attendant Regina at bedside upon this nurses arrival to unit. States patient was previously resting. States after stimulation from provider she became restless but not agitated. Continues to remain bedside

## 2023-12-18 NOTE — Progress Notes (Signed)
 Pt is resting peacefully in the bed at this time with sister at the bedside. Sister stated that pt is saying some things that make sense now. For example, he knew her name and who she was. Pt also stated he was hot and wanted to sit up. Pt is not in restraints at this time per family request. Safety sitter is outside of door per family request and sister is at bedside. RN will continue to monitor pt.   [name]

## 2023-12-19 DIAGNOSIS — R188 Other ascites: Secondary | ICD-10-CM | POA: Diagnosis not present

## 2023-12-19 DIAGNOSIS — G9341 Metabolic encephalopathy: Secondary | ICD-10-CM | POA: Diagnosis not present

## 2023-12-19 DIAGNOSIS — K652 Spontaneous bacterial peritonitis: Secondary | ICD-10-CM | POA: Diagnosis not present

## 2023-12-19 DIAGNOSIS — N186 End stage renal disease: Secondary | ICD-10-CM | POA: Diagnosis not present

## 2023-12-19 LAB — CBC
HCT: 32.8 % — ABNORMAL LOW (ref 39.0–52.0)
Hemoglobin: 10.3 g/dL — ABNORMAL LOW (ref 13.0–17.0)
MCH: 28 pg (ref 26.0–34.0)
MCHC: 31.4 g/dL (ref 30.0–36.0)
MCV: 89.1 fL (ref 80.0–100.0)
Platelets: 97 K/uL — ABNORMAL LOW (ref 150–400)
RBC: 3.68 MIL/uL — ABNORMAL LOW (ref 4.22–5.81)
RDW: 17.6 % — ABNORMAL HIGH (ref 11.5–15.5)
WBC: 3 K/uL — ABNORMAL LOW (ref 4.0–10.5)
nRBC: 0 % (ref 0.0–0.2)

## 2023-12-19 LAB — CULTURE, BLOOD (ROUTINE X 2)
Culture: NO GROWTH
Culture: NO GROWTH

## 2023-12-19 LAB — COMPREHENSIVE METABOLIC PANEL WITH GFR
ALT: 16 U/L (ref 0–44)
AST: 28 U/L (ref 15–41)
Albumin: <1.5 g/dL — ABNORMAL LOW (ref 3.5–5.0)
Alkaline Phosphatase: 45 U/L (ref 38–126)
Anion gap: 13 (ref 5–15)
BUN: 33 mg/dL — ABNORMAL HIGH (ref 6–20)
CO2: 25 mmol/L (ref 22–32)
Calcium: 7.9 mg/dL — ABNORMAL LOW (ref 8.9–10.3)
Chloride: 95 mmol/L — ABNORMAL LOW (ref 98–111)
Creatinine, Ser: 5.32 mg/dL — ABNORMAL HIGH (ref 0.61–1.24)
GFR, Estimated: 12 mL/min — ABNORMAL LOW (ref >60)
Glucose, Bld: 86 mg/dL (ref 70–99)
Potassium: 4.1 mmol/L (ref 3.5–5.1)
Sodium: 133 mmol/L — ABNORMAL LOW (ref 135–145)
Total Bilirubin: 1.1 mg/dL (ref 0.0–1.2)
Total Protein: 6.6 g/dL (ref 6.5–8.1)

## 2023-12-19 LAB — PHOSPHORUS: Phosphorus: 4.3 mg/dL (ref 2.5–4.6)

## 2023-12-19 LAB — MAGNESIUM: Magnesium: 1.9 mg/dL (ref 1.7–2.4)

## 2023-12-19 NOTE — Progress Notes (Signed)
  [facility] Progress Note   Subjective:  Seen in room. Completed dialysis yesterday - net UF 3L. Remains lethargic, but out of restraints this am.   Objective Vitals:   12/18/23 2300 12/18/23 2352 12/19/23 0454 12/19/23 0748  BP:  136/75 135/83 116/69  Pulse:  89 88 85  Resp:  16 18 18   Temp:  98.4 F (36.9 C) 98 F (36.7 C) 98 F (36.7 C)  TempSrc:  Axillary Oral Oral  SpO2: 94% 94% 94% 94%  Weight:      Height:       Physical Exam Gen: Lying in bed, nad  Pulm: Normal wob CV: RRR Abd: soft, non-tender Ext: 1+ LE edema Access: LUE AVF +bruit.   Additional Objective Labs: Basic Metabolic Panel: Recent Labs  Lab 12/16/23 0403 12/17/23 0502 12/19/23 0834  NA 130* 131* 133*  K 5.6* 4.3 4.1  CL 90* 94* 95*  CO2 24 24 25   GLUCOSE 112* 77 86  BUN 46* 28* 33*  CREATININE 7.12* 4.97* 5.32*  CALCIUM  7.3* 7.6* 7.9*  PHOS 7.4* 4.7* 4.3   Liver Function Tests: Recent Labs  Lab 12/15/23 0412 12/16/23 0403 12/19/23 0834  AST 43* 41 28  ALT 20 23 16   ALKPHOS 47 41 45  BILITOT 0.5 0.7 1.1  PROT 6.5 6.8 6.6  ALBUMIN  1.7* 1.6* <1.5*   Recent Labs  Lab 12/14/23 1150  LIPASE 31   CBC: Recent Labs  Lab 12/14/23 1150 12/15/23 0412 12/16/23 0403 12/17/23 0502 12/19/23 0834  WBC 1.7* 3.9* 5.0 5.3 3.0*  NEUTROABS  --  3.4  --   --   --   HGB 12.4* 11.1* 10.2* 10.2* 10.3*  HCT 39.2 34.8* 32.3* 32.1* 32.8*  MCV 89.7 88.5 90.2 89.2 89.1  PLT 77* 64* 73* 86* 97*   Medications:  cefTRIAXone  (ROCEPHIN )  IV 2 g (12/19/23 0800)    carvedilol   25 mg Oral BID   Chlorhexidine  Gluconate Cloth  6 each Topical Q0600   Chlorhexidine  Gluconate Cloth  6 each Topical Q0600   folic acid   1 mg Oral Daily   levothyroxine   50 mcg Oral QAC breakfast   lidocaine -EPINEPHrine   30 mL Infiltration Once   multivitamin with minerals  1 tablet Oral Daily   polyethylene glycol  17 g Oral Daily   thiamine   100 mg Oral Daily   Or   thiamine   100 mg Intravenous Daily    Home bp meds: Coreg  25 bid Hydralazine 25 bid (not taking) Irbesartan  300 mg every day      OP HD: SW TTS 3h  B400  71kg   2K bath  AVF   Heparin  none     Assessment/ Plan: Peritonitis.  CT showed findings suspicious for SBP. Fluid cx + Klebsiella. On ceftriaxone . Per primary  Acute encephalopathy. c/w alcohol withdrawal. On CIWA protocol  ESRD: HD TTS. Next HD Tues.  HTN. BP acceptable Continue home meds.  Volume: CXR clear, no congestion/ edema. At dry weight.  Anemia. Hgb 10.  no esa needs. Follow.  BMD: Ca/phos acceptable. Follow trends.  Recurrent ascites: has been getting paracenteses since 2022. Had paracentesis 8/20 and they removed 1.6L.   Maisie Ronnald Acosta PA-C [facility] 12/19/2023,10:58 AM

## 2023-12-19 NOTE — Plan of Care (Signed)

## 2023-12-19 NOTE — Progress Notes (Signed)
 Pt has not been in restraints all night per family request and has done well with the sister at the bedside throughout the night. Pt is starting to wake up and talk more when cleaning patient this morning. RN will continue to monitor patient.   Bari HERO Jammie Clink

## 2023-12-19 NOTE — Progress Notes (Signed)
 Progress Note   Patient: [name] FMW:[id number] DOB: [birth year] DOA: 12/14/2023  DOS: the patient was seen and examined on 12/19/2023   Brief hospital course:  53 year old male with a history of end-stage renal disease on hemodialysis Tuesday Thursday Saturday, history of lupus, history of ascites without a diagnosis of cirrhosis, history of systolic congestive heart EF of 35 to 40% who presents to the ER with a sudden onset of abdominal pain that started last night    Assessment and Plan:   Acute metabolic encephalopathy-likely alcohol withdrawal - Beginning 8/22 patient appeared very anxious, delirious, paranoid, even hallucinating.  Etiology unclear.  Differentials include delirium, alcohol withdrawal, metabolic etiology.  Ammonia normal.  Discussion with sister bedside stating that patient does drink to cover pain from his lupus.  Although he has reduced his alcohol consumption.  Currently on CIWA protocol.  Appears mildly improved today.  Mitts and restraints removed.  Family at bedside providing supportive care and reorientation which is a big help.  Unwitnessed fall - Apparently patient became delirious and walked in the hallway and had an unwitnessed fall floor night of 8/22.  CT showing no intracranial abnormalities.     Spontaneous bacterial peritonitis - Initial peritoneal fluid collection noting greater than 3600 WBCs, 95% neutrophils.  Culture noting Klebsiella.  Continue ceftriaxone  (day 6 of 10-14), discontinued Flagyl .     Large volume ascites - IR paracentesis performed 8/20 with 1.8 L removed.     ESRD - HD TTS.  Nephrology on board following closely.   HFpEF - Appears euvolemic.  Volume per nephrology/HD/UF.   Hyperkalemia - Received 1 dose of p.o. Lokelma .  Managed via HD.   Hypertension - Coreg  on board.   Hypothyroidism - TSH 7.89.  Initiated on Synthroid  50 micrograms daily.  Chronic pancytopenia - Continue to monitor.  SCDs for DVT prophylaxis.    Systemic lupus erythematosus with lung involvement - Stable.  Holding Plaquenil .  Discussed with sister about ongoing need to follow-up closely as lupus appears to be underlying cause of many of his comorbidities both directly and indirectly.  Goals of care - Patient has multiple comorbidities, very frail, low albumin , poor p.o. intake.  Discussed at length with patient's sister about need for close follow-up with multiple subspecialists.   Subjective: Patient resting comfortably this morning.  Lethargic but rousable.  Sister at bedside.  Patient denies pain, shortness of breath, nausea, vomiting.  No longer in restraints this morning.  Does not appear to be paranoid or hallucinating.  P.o. intake still very poor.  Physical Exam:  Vitals:   12/18/23 2300 12/18/23 2352 12/19/23 0454 12/19/23 0748  BP:  136/75 135/83 116/69  Pulse:  89 88 85  Resp:  16 18 18   Temp:  98.4 F (36.9 C) 98 F (36.7 C) 98 F (36.7 C)  TempSrc:  Axillary Oral Oral  SpO2: 94% 94% 94% 94%  Weight:      Height:        GENERAL:  Alert, lethargic but rousable, disheveled HEENT:  EOMI CARDIOVASCULAR:  RRR, systolic murmur appreciated RESPIRATORY:  Clear to auscultation, no wheezing, rales, or rhonchi GASTROINTESTINAL: Distended, minimally tender EXTREMITIES: Thin, no LE edema bilaterally NEURO:  No new focal deficits appreciated SKIN:  No rashes noted PSYCH: Appropriate, lethargic  Data Reviewed:  Imaging Studies: CT HEAD WO CONTRAST ( ) Result Date: 12/18/2023 CLINICAL DATA:  Head trauma, coagulopathy (Age 75-64y) EXAM: CT HEAD WITHOUT CONTRAST TECHNIQUE: Contiguous axial images were obtained from the base of the skull  through the vertex without intravenous contrast. RADIATION DOSE REDUCTION: This exam was performed according to the departmental dose-optimization program which includes automated exposure control, adjustment of the mA and/or kV according to patient size and/or use of iterative  reconstruction technique. COMPARISON:  None Available. FINDINGS: Mildly motion limited study. Brain: No evidence of acute infarction, hemorrhage, hydrocephalus, extra-axial collection or mass lesion/mass effect. Vascular: No hyperdense vessel. Skull: No acute fracture. Sinuses/Orbits: Clear sinuses.  No acute orbital findings. Other: No mastoid effusions. IMPRESSION: No evidence of acute intracranial abnormality. Electronically Signed   By: Gilmore GORMAN Molt M.D.   On: 12/18/2023 00:43   IR Paracentesis Result Date: 12/15/2023 INDICATION: 53 year old male. History of lupus, end-stage renal disease with recurrent ascites. Request for therapeutic paracentesis EXAM: ULTRASOUND GUIDED LEFT-SIDED THERAPEUTIC PARACENTESIS MEDICATIONS: Lidocaine  1% 10 mL COMPLICATIONS: None immediate. PROCEDURE: Informed written consent was obtained from the patient after a discussion of the risks, benefits and alternatives to treatment. A timeout was performed prior to the initiation of the procedure. Initial ultrasound scanning demonstrates a small amount of ascites within the right lower abdominal quadrant. The right lower abdomen was prepped and draped in the usual sterile fashion. 1% lidocaine  was used for local anesthesia. Following this, a 6 Fr Safe-T-Centesis catheter was introduced. An ultrasound image was saved for documentation purposes. The paracentesis was performed. The catheter was removed and a dressing was applied. The patient tolerated the procedure well without immediate post procedural complication. FINDINGS: A total of approximately 1.8 L of straw-colored fluid was removed. IMPRESSION: Successful ultrasound-guided therapeutic left-sided paracentesis yielding 1.8 liters of peritoneal fluid. Performed by Delon Beagle NP Electronically Signed   By: CHRISTELLA.  Shick M.D.   On: 12/15/2023 10:41   ECHOCARDIOGRAM COMPLETE Result Date: 12/15/2023    ECHOCARDIOGRAM REPORT   Patient Name:   [name] Date of Exam:  12/15/2023 Medical Rec #:  [id number]       Height:       71.0 in Accession #:    [id number]      Weight:       168.4 lb Date of Birth:  Aug 26, 1970       BSA:          1.960 m Patient Age:    53 years        BP:           107/74 mmHg Patient Gender: M               HR:           90 bpm. Exam Location:  Inpatient Procedure: 2D Echo, Cardiac Doppler and Color Doppler (Both Spectral and Color            Flow Doppler were utilized during procedure). Indications:    CHF Acute Systolic I50.21  History:        Patient has prior history of Echocardiogram examinations, most                 recent 04/28/2020. CHF.  Sonographer:    Tinnie Gosling RDCS Referring Phys: [id number] ERIC CHEN IMPRESSIONS  1. Left ventricular ejection fraction, by estimation, is 55 to 60%. The left ventricle has normal function. The left ventricle has no regional wall motion abnormalities. There is severe concentric left ventricular hypertrophy. Left ventricular diastolic  parameters are consistent with Grade I diastolic dysfunction (impaired relaxation).  2. Right ventricular systolic function is normal. The right ventricular size is normal.  3. Left atrial size was moderately dilated.  4. Right atrial size was mildly dilated.  5. The mitral valve is normal in structure. Trivial mitral valve regurgitation. No evidence of mitral stenosis.  6. The aortic valve is tricuspid. There is mild calcification of the aortic valve. Aortic valve regurgitation is not visualized. Aortic valve sclerosis/calcification is present, without any evidence of aortic stenosis.  7. Aortic dilatation noted. There is mild dilatation of the aortic root, measuring 40 mm.  8. The inferior vena cava is dilated in size with >50% respiratory variability, suggesting right atrial pressure of 8 mmHg. FINDINGS  Left Ventricle: Left ventricular ejection fraction, by estimation, is 55 to 60%. The left ventricle has normal function. The left ventricle has no regional wall motion abnormalities. The  left ventricular internal cavity size was normal in size. There is  severe concentric left ventricular hypertrophy. Left ventricular diastolic parameters are consistent with Grade I diastolic dysfunction (impaired relaxation). Right Ventricle: The right ventricular size is normal. No increase in right ventricular wall thickness. Right ventricular systolic function is normal. Left Atrium: Left atrial size was moderately dilated. Right Atrium: Right atrial size was mildly dilated. Pericardium: There is no evidence of pericardial effusion. Mitral Valve: The mitral valve is normal in structure. Trivial mitral valve regurgitation. No evidence of mitral valve stenosis. MV peak gradient, 4.2 mmHg. The mean mitral valve gradient is 3.0 mmHg. Tricuspid Valve: The tricuspid valve is normal in structure. Tricuspid valve regurgitation is mild . No evidence of tricuspid stenosis. Aortic Valve: The aortic valve is tricuspid. There is mild calcification of the aortic valve. Aortic valve regurgitation is not visualized. Aortic valve sclerosis/calcification is present, without any evidence of aortic stenosis. Pulmonic Valve: The pulmonic valve was normal in structure. Pulmonic valve regurgitation is trivial. No evidence of pulmonic stenosis. Aorta: Aortic dilatation noted. There is mild dilatation of the aortic root, measuring 40 mm. Venous: The inferior vena cava is dilated in size with greater than 50% respiratory variability, suggesting right atrial pressure of 8 mmHg. IAS/Shunts: No atrial level shunt detected by color flow Doppler.  LEFT VENTRICLE PLAX 2D LVIDd:         4.60 cm   Diastology LVIDs:         3.20 cm   LV e' lateral:   8.16 cm/s LV PW:         2.10 cm   LV E/e' lateral: 13.1 LV IVS:        2.00 cm LVOT diam:     2.10 cm LV SV:         79 LV SV Index:   40 LVOT Area:     3.46 cm  RIGHT VENTRICLE             IVC RV S prime:     14.30 cm/s  IVC diam: 2.30 cm TAPSE (M-mode): 2.1 cm LEFT ATRIUM             Index         RIGHT ATRIUM           Index LA diam:        3.70 cm 1.89 cm/m   RA Area:     18.20 cm LA Vol (A2C):   76.1 ml 38.82 ml/m  RA Volume:   44.90 ml  22.91 ml/m LA Vol (A4C):   91.2 ml 46.53 ml/m LA Biplane Vol: 93.0 ml 47.45 ml/m  AORTIC VALVE LVOT Vmax:   127.00 cm/s LVOT Vmean:  80.100 cm/s LVOT VTI:    0.229 m  AORTA Ao Root diam: 4.00 cm Ao Asc diam:  3.70 cm MITRAL VALVE MV Area (PHT): 4.65 cm     SHUNTS MV Area VTI:   3.34 cm     Systemic VTI:  0.23 m MV Peak grad:  4.2 mmHg     Systemic Diam: 2.10 cm MV Mean grad:  3.0 mmHg MV Vmax:       1.03 m/s MV Vmean:      83.0 cm/s MV Decel Time: 163 msec MV E velocity: 107.00 cm/s MV A velocity: 117.00 cm/s MV E/A ratio:  0.91 Toribio Fuel MD Electronically signed by Toribio Fuel MD Signature Date/Time: 12/15/2023/9:40:29 AM    Final    CT ABDOMEN PELVIS W CONTRAST Result Date: 12/14/2023 CLINICAL DATA:  Right lower quadrant abdominal pain for 2 days. Nausea and vomiting. Hemodialysis patient. EXAM: CT ABDOMEN AND PELVIS WITH CONTRAST TECHNIQUE: Multidetector CT imaging of the abdomen and pelvis was performed using the standard protocol following bolus administration of intravenous contrast. RADIATION DOSE REDUCTION: This exam was performed according to the departmental dose-optimization program which includes automated exposure control, adjustment of the mA and/or kV according to patient size and/or use of iterative reconstruction technique. CONTRAST:  75mL OMNIPAQUE  IOHEXOL  350 MG/ML SOLN COMPARISON:  Abdominopelvic CT 08/04/2021 and 06/29/2021. FINDINGS: Lower chest: Interval improved aeration of the left lung base with mild residual atelectasis or scarring. No confluent airspace disease or significant pleural effusion. Moderate cardiomegaly without significant pericardial fluid. There is coronary artery atherosclerosis with calcifications of the mitral annulus. Hepatobiliary: The liver is normal in density without suspicious focal abnormality. No  evidence of gallstones, gallbladder wall thickening or biliary dilatation. Pancreas: Unremarkable. No pancreatic ductal dilatation or surrounding inflammatory changes. Spleen: Normal in size without focal abnormality. Adrenals/Urinary Tract: Both adrenal glands appear normal. Moderate to severe renal cortical thinning and atrophy bilaterally, progressive from previous examination. No significant contrast excretion is demonstrated on the delayed images. There is no hydronephrosis or urinary tract calculus. The bladder appears unremarkable for its degree of distention. Stomach/Bowel: No enteric contrast administered. The stomach appears unremarkable for its degree of distention. Mild small bowel wall thickening with mucosal hyperenhancement in the mid abdomen. The appendix is not clearly seen on the current study, although there is no pericecal inflammation to suggest appendicitis. Mild diffuse colonic wall thickening. There are diverticular changes throughout the descending and sigmoid colon without focal surrounding inflammation. No evidence of bowel obstruction or perforation. Vascular/Lymphatic: Scattered prominent retroperitoneal lymph nodes, similar to previous study and likely reactive. Aortic and branch vessel atherosclerosis without evidence of aneurysm or large vessel occlusion. The portal, superior mesenteric and splenic veins are patent. Reproductive: The prostate gland and seminal vesicles appear unremarkable. Other: Moderate to large volume ascites again noted, similar in overall volume to the previous CT. There is apparent new diffuse peritoneal thickening without apparent nodularity. A large amount of fluid extends into the fall canal and screw, incompletely visualized but increased in volume from the previous study. No herniated bowel, organized fluid collection or pneumoperitoneum. Musculoskeletal: Chronic femoral head osteonecrosis bilaterally without subchondral collapse or significant secondary  degenerative changes. Mild lumbar spondylosis. No acute osseous findings are identified. Generalized subcutaneous edema appears similar to previous CT. IMPRESSION: 1. Moderate to large volume ascites, similar in overall volume to the previous CT. There is new diffuse smooth peritoneal thickening without apparent nodularity, suspicious for peritonitis. Consider diagnostic paracentesis. 2. Mild small bowel and colonic wall thickening, nonspecific in the setting of ascites. No evidence of bowel  obstruction or perforation. The appendix is not discretely visualized, although there are no specific signs of appendicitis. 3. Progressive renal cortical thinning and atrophy bilaterally. No hydronephrosis or urinary tract calculus. 4. Chronic femoral head osteonecrosis bilaterally without subchondral collapse or significant secondary degenerative changes. 5.  Aortic Atherosclerosis (ICD10-I70.0). Electronically Signed   By: Elsie Perone M.D.   On: 12/14/2023 14:56   DG Chest 2 View Result Date: 12/14/2023 CLINICAL DATA:  SHOB EXAM: CHEST - 2 VIEW COMPARISON:  June 29, 2021 FINDINGS: Lower lung volumes . Bilateral perihilar interstitial opacities. No pneumothorax. Trace left pleural effusion. Mild cardiomegaly. Tortuous aorta with aortic atherosclerosis. No acute fracture or destructive lesions. Multilevel thoracic osteophytosis. IMPRESSION: Bilateral perihilar interstitial opacities, which may represent bronchovascular crowding due to low lung volumes, interstitial edema, or atypical/viral infection, in the correct clinical context. Electronically Signed   By: Rogelia Myers M.D.   On: 12/14/2023 13:09    There are no new results to review at this time.  Previous records (including but not limited to H&P, progress notes, nursing notes, TOC management) were reviewed in assessment of this patient.  Labs: CBC: Recent Labs  Lab 12/14/23 1150 12/15/23 0412 12/16/23 0403 12/17/23 0502 12/19/23 0834  WBC 1.7*  3.9* 5.0 5.3 3.0*  NEUTROABS  --  3.4  --   --   --   HGB 12.4* 11.1* 10.2* 10.2* 10.3*  HCT 39.2 34.8* 32.3* 32.1* 32.8*  MCV 89.7 88.5 90.2 89.2 89.1  PLT 77* 64* 73* 86* 97*   Basic Metabolic Panel: Recent Labs  Lab 12/14/23 1150 12/15/23 0412 12/16/23 0403 12/17/23 0502 12/19/23 0834  NA 133* 132* 130* 131* 133*  K 5.5* 4.6 5.6* 4.3 4.1  CL 95* 94* 90* 94* 95*  CO2 24 23 24 24 25   GLUCOSE 85 51* 112* 77 86  BUN 56* 36* 46* 28* 33*  CREATININE 7.90* 5.94* 7.12* 4.97* 5.32*  CALCIUM  8.3* 7.5* 7.3* 7.6* 7.9*  MG  --   --  1.7 1.7 1.9  PHOS  --   --  7.4* 4.7* 4.3   Liver Function Tests: Recent Labs  Lab 12/14/23 1150 12/15/23 0412 12/16/23 0403 12/19/23 0834  AST 34 43* 41 28  ALT 21 20 23 16   ALKPHOS 68 47 41 45  BILITOT 1.0 0.5 0.7 1.1  PROT 8.3* 6.5 6.8 6.6  ALBUMIN  2.2* 1.7* 1.6* <1.5*   CBG: Recent Labs  Lab 12/17/23 2014  GLUCAP 79    Scheduled Meds:  carvedilol   25 mg Oral BID   Chlorhexidine  Gluconate Cloth  6 each Topical Q0600   Chlorhexidine  Gluconate Cloth  6 each Topical Q0600   folic acid   1 mg Oral Daily   levothyroxine   50 mcg Oral QAC breakfast   lidocaine -EPINEPHrine   30 mL Infiltration Once   multivitamin with minerals  1 tablet Oral Daily   polyethylene glycol  17 g Oral Daily   thiamine   100 mg Oral Daily   Or   thiamine   100 mg Intravenous Daily   Continuous Infusions:  cefTRIAXone  (ROCEPHIN )  IV 2 g (12/19/23 0800)   PRN Meds:.acetaminophen  **OR** acetaminophen , albuterol , bisacodyl , bisacodyl , melatonin, ondansetron  **OR** ondansetron  (ZOFRAN ) IV  Family Communication: None at bedside  Disposition: Status is: Inpatient Remains inpatient appropriate because: Encephalopathy, ESRD, SBP     Time spent: 49 minutes  Length of inpatient stay: 5 days  Author: Carliss LELON Canales, DO 12/19/2023 12:04 PM  For on call review [email or web address].

## 2023-12-19 NOTE — Progress Notes (Signed)
 Patient sister requested that safety observer sits in hallway outside of closed room door with curtain closed. Patients sister only allowed nurse to give patient his IV ABT but refused for him to have PO medications. Sister insisted that patient does not have anything by mouth until further notice due to his current state.

## 2023-12-20 ENCOUNTER — Inpatient Hospital Stay (HOSPITAL_COMMUNITY)

## 2023-12-20 DIAGNOSIS — K652 Spontaneous bacterial peritonitis: Secondary | ICD-10-CM | POA: Diagnosis not present

## 2023-12-20 DIAGNOSIS — E039 Hypothyroidism, unspecified: Secondary | ICD-10-CM | POA: Diagnosis not present

## 2023-12-20 DIAGNOSIS — N186 End stage renal disease: Secondary | ICD-10-CM | POA: Diagnosis not present

## 2023-12-20 DIAGNOSIS — R188 Other ascites: Secondary | ICD-10-CM | POA: Diagnosis not present

## 2023-12-20 LAB — CBC
HCT: 30.9 % — ABNORMAL LOW (ref 39.0–52.0)
Hemoglobin: 9.9 g/dL — ABNORMAL LOW (ref 13.0–17.0)
MCH: 28.1 pg (ref 26.0–34.0)
MCHC: 32 g/dL (ref 30.0–36.0)
MCV: 87.8 fL (ref 80.0–100.0)
Platelets: 91 K/uL — ABNORMAL LOW (ref 150–400)
RBC: 3.52 MIL/uL — ABNORMAL LOW (ref 4.22–5.81)
RDW: 17.7 % — ABNORMAL HIGH (ref 11.5–15.5)
WBC: 2.9 K/uL — ABNORMAL LOW (ref 4.0–10.5)
nRBC: 0 % (ref 0.0–0.2)

## 2023-12-20 LAB — COMPREHENSIVE METABOLIC PANEL WITH GFR
ALT: 15 U/L (ref 0–44)
AST: 31 U/L (ref 15–41)
Albumin: <1.5 g/dL — ABNORMAL LOW (ref 3.5–5.0)
Alkaline Phosphatase: 49 U/L (ref 38–126)
Anion gap: 7 (ref 5–15)
BUN: 45 mg/dL — ABNORMAL HIGH (ref 6–20)
CO2: 26 mmol/L (ref 22–32)
Calcium: 7.7 mg/dL — ABNORMAL LOW (ref 8.9–10.3)
Chloride: 96 mmol/L — ABNORMAL LOW (ref 98–111)
Creatinine, Ser: 6.28 mg/dL — ABNORMAL HIGH (ref 0.61–1.24)
GFR, Estimated: 10 mL/min — ABNORMAL LOW (ref >60)
Glucose, Bld: 137 mg/dL — ABNORMAL HIGH (ref 70–99)
Potassium: 3.9 mmol/L (ref 3.5–5.1)
Sodium: 129 mmol/L — ABNORMAL LOW (ref 135–145)
Total Bilirubin: 0.5 mg/dL (ref 0.0–1.2)
Total Protein: 6 g/dL — ABNORMAL LOW (ref 6.5–8.1)

## 2023-12-20 LAB — MAGNESIUM: Magnesium: 2 mg/dL (ref 1.7–2.4)

## 2023-12-20 LAB — PHOSPHORUS: Phosphorus: 4 mg/dL (ref 2.5–4.6)

## 2023-12-20 MED ORDER — HYDROCODONE-ACETAMINOPHEN 5-325 MG PO TABS
1.0000 | ORAL_TABLET | Freq: Once | ORAL | Status: AC
  Administered 2023-12-20: 1 via ORAL
  Filled 2023-12-20: qty 1

## 2023-12-20 NOTE — Evaluation (Signed)
 Occupational Therapy Evaluation Patient Details Name: [name] MRN: [id number] DOB: 13-Jul-1970 Today's Date: 12/20/2023   History of Present Illness   53 year old male who presents to the ER with unwitnessed fall, with a sudden onset of abdominal pain that started last night, Acute metabolic encephalopathy-likely alcohol withdrawal, Systemic lupus erythematosus with lung involvement, Spontaneous bacterial peritonitis,  with a history of end-stage renal disease on hemodialysis Tuesday Thursday Saturday, history of lupus, history of ascites without a diagnosis of cirrhosis, history of systolic congestive heart EF of 35 to 40%     Clinical Impressions Pt c/o generalized pain and feeling unwell. Pt lives alone, 3 STE, 2 story house, able to live on first floor, has sister who could help occasionally, PLOF ind. Pt currently with significant confusion, flat affect, poor safety awareness, not aware of deficits, difficulty following commands, frequent knee buckling when standing/transfering, mod/max for ADLs. Pt with notable swelling to scrotum, states it's not too uncomfortable, given scrotal sling for comfort but Pt states he doesn't not need it. Assisted Pt to [facility], with loose stools. Pt left in recliner, NT assisting with changing bed sheets. Pt food left in front on tray, all needs met, will continue to see acutely to progress as able, recommending postacute rehab <3hrs/day to maximize functional strength and safety awareness.      If plan is discharge home, recommend the following:   Two people to help with walking and/or transfers;A lot of help with bathing/dressing/bathroom;Assistance with cooking/housework;Assistance with feeding;Help with stairs or ramp for entrance;Assist for transportation;Direct supervision/assist for medications management;Supervision due to cognitive status     Functional Status Assessment   Patient has had a recent decline in their functional status and  demonstrates the ability to make significant improvements in function in a reasonable and predictable amount of time.     Equipment Recommendations   Other (comment) (defer)     Recommendations for Other Services         Precautions/Restrictions   Precautions Precautions: Fall Recall of Precautions/Restrictions: Impaired Restrictions Weight Bearing Restrictions Per Provider Order: No     Mobility Bed Mobility Overal bed mobility: Needs Assistance Bed Mobility: Supine to Sit     Supine to sit: Mod assist     General bed mobility comments: mod A to power sitting up, able to sit EOB statically    Transfers Overall transfer level: Needs assistance Equipment used: Rolling walker (2 wheels) Transfers: Sit to/from Stand, Bed to chair/wheelchair/BSC Sit to Stand: Mod assist     Step pivot transfers: Mod assist     General transfer comment: mod A due to poor balance and safety awareness, knees buckled multiple times      Balance Overall balance assessment: Needs assistance Sitting-balance support: No upper extremity supported, Feet supported Sitting balance-Leahy Scale: Poor Sitting balance - Comments: able to sit statically but occassionally has posterior/lateral lean requirining assistance to maintain balance   Standing balance support: Bilateral upper extremity supported, During functional activity, Reliant on assistive device for balance Standing balance-Leahy Scale: Poor Standing balance comment: frequent knee buckling                           ADL either performed or assessed with clinical judgement   ADL Overall ADL's : Needs assistance/impaired     Grooming: Moderate assistance   Upper Body Bathing: Moderate assistance   Lower Body Bathing: Maximal assistance   Upper Body Dressing : Moderate assistance   Lower Body  Dressing: Maximal assistance   Toilet Transfer: Moderate assistance;Rolling walker (2 wheels);BSC/3in1   Toileting-  Clothing Manipulation and Hygiene: Maximal assistance         General ADL Comments: Pt mod/max A for ADLs, poor safety awareness, knees buckled multiple times during session, poor balance, generalized weakness, poor task initiation, poor hygiene     Vision         Perception         Praxis         Pertinent Vitals/Pain Pain Assessment Pain Assessment: Faces Faces Pain Scale: Hurts even more Pain Location: generalized Pain Descriptors / Indicators: Aching, Constant, Grimacing, Discomfort Pain Intervention(s): Limited activity within patient's tolerance, Monitored during session     Extremity/Trunk Assessment Upper Extremity Assessment Upper Extremity Assessment: Difficult to assess due to impaired cognition   Lower Extremity Assessment Lower Extremity Assessment: Defer to PT evaluation       Communication Communication Communication: Impaired Factors Affecting Communication: Reduced clarity of speech   Cognition Arousal: Lethargic Behavior During Therapy: Flat affect Cognition: Cognition impaired             OT - Cognition Comments: Pt has difficulty following commands, poor safety awareness                 Following commands: Impaired Following commands impaired: Follows one step commands inconsistently, Follows one step commands with increased time     Cueing  General Comments   Cueing Techniques: Verbal cues;Gestural cues;Tactile cues      Exercises     Shoulder Instructions      Home Living Family/patient expects to be discharged to:: Private residence Living Arrangements: Alone Available Help at Discharge: Family;Available PRN/intermittently Type of Home: House Home Access: Stairs to enter Entrance Stairs-Number of Steps: 3   Home Layout: Two level;Able to live on main level with bedroom/bathroom     Bathroom Shower/Tub: Producer, television/film/video: Standard     Home Equipment: None   Additional Comments: lives  alone, has sister who can help occastionally      Prior Functioning/Environment Prior Level of Function : Independent/Modified Independent             Mobility Comments: reports ind ADLs Comments: reports ind    OT Problem List: Decreased strength;Decreased activity tolerance;Impaired balance (sitting and/or standing);Decreased coordination;Decreased cognition;Decreased safety awareness;Pain;Impaired UE functional use   OT Treatment/Interventions: Self-care/ADL training;Therapeutic exercise;Energy conservation;DME and/or AE instruction;Therapeutic activities;Patient/family education;Balance training      OT Goals(Current goals can be found in the care plan section)   Acute Rehab OT Goals Patient Stated Goal: to manage pain OT Goal Formulation: With patient Time For Goal Achievement: 01/03/24 Potential to Achieve Goals: Good   OT Frequency:  Min 2X/week    Co-evaluation              AM-PAC OT 6 Clicks Daily Activity     Outcome Measure Help from another person eating meals?: A Lot Help from another person taking care of personal grooming?: A Lot Help from another person toileting, which includes using toliet, bedpan, or urinal?: A Lot Help from another person bathing (including washing, rinsing, drying)?: A Lot Help from another person to put on and taking off regular upper body clothing?: A Lot Help from another person to put on and taking off regular lower body clothing?: A Lot 6 Click Score: 12   End of Session Equipment Utilized During Treatment: Gait belt;Rolling walker (2 wheels) Nurse Communication: Mobility status  Activity  Tolerance: Patient limited by lethargy Patient left: in chair;with call bell/phone within reach;with nursing/sitter in room  OT Visit Diagnosis: Unsteadiness on feet (R26.81);Other abnormalities of gait and mobility (R26.89);Repeated falls (R29.6);Muscle weakness (generalized) (M62.81);Pain;Other symptoms and signs involving  cognitive function                Time: 1122-1207 OT Time Calculation (min): 45 min Charges:  OT General Charges $OT Visit: 1 Visit OT Evaluation $OT Eval Moderate Complexity: 1 Mod OT Treatments $Self Care/Home Management : 23-37 mins  [address] OTR/L   Elouise JONELLE Bott 12/20/2023, 12:21 PM

## 2023-12-20 NOTE — Progress Notes (Signed)
 Progress Note   Patient: [name] FMW:[id number] DOB: 1970/08/28 DOA: 12/14/2023  DOS: the patient was seen and examined on 12/20/2023   Brief hospital course:  53 year old male with a history of end-stage renal disease on hemodialysis Tuesday Thursday Saturday, history of lupus, history of ascites without a diagnosis of cirrhosis, history of systolic congestive heart EF of 35 to 40% who presents to the ER with a sudden onset of abdominal pain that started last night    Assessment and Plan:   Acute metabolic encephalopathy-likely alcohol withdrawal - Beginning 8/22 patient appeared very anxious, delirious, paranoid, even hallucinating.  Etiology unclear.  Differentials include delirium, alcohol withdrawal, metabolic etiology.  Ammonia normal.  Discussion with sister bedside stating that patient does drink to cover pain from his lupus.  Although he has reduced his alcohol consumption.  Currently on CIWA protocol.  Appears mildly improved today.  Family at bedside providing supportive care and reorientation which is a big help.  More alert and talkative.  Will be very sparing with any sedative type medications are needed.   Unwitnessed fall - Apparently patient became delirious and walked in the hallway and had an unwitnessed fall floor night of 8/22.  CT showing no intracranial abnormalities.     Spontaneous bacterial peritonitis - Initial peritoneal fluid collection noting greater than 3600 WBCs, 95% neutrophils.  Culture noting Klebsiella.  Continue ceftriaxone  (day 7 of 10-14), discontinued Flagyl .     Large volume ascites - IR paracentesis performed 8/20 with 1.8 L removed.  Patient having worsening abdominal tightness and tenderness this morning.  Will attempt to order repeat therapeutic paracentesis.  Etiology likely from severely low albumin .   ESRD - HD TTS.  Nephrology on board following closely.   HFpEF - Appears euvolemic.  Volume per nephrology/HD/UF.   Hyperkalemia -  Received 1 dose of p.o. Lokelma .  Managed via HD.   Hypertension - Coreg  on board.   Hypothyroidism - TSH 7.89.  Initiated on Synthroid  50 micrograms daily.   Chronic pancytopenia - Continue to monitor.  SCDs for DVT prophylaxis.   Systemic lupus erythematosus with lung involvement - Stable.  Holding Plaquenil .  Discussed with sister about ongoing need to follow-up closely as lupus appears to be underlying cause of many of his comorbidities both directly and indirectly.   Goals of care - Patient has multiple comorbidities, very frail, low albumin , poor p.o. intake, very low albumin .  Discussed at length with patient's sister about need for close follow-up with multiple subspecialists.  In the meantime, will order PT/OT eval now that patient is more lucid.  Ideally encephalopathy will resolve and discussion about discharge hopefully Wednesday (dialysis Tuesday).   Subjective: Patient more alert this morning, talkative, responding.  Still a bit lethargic and slow to respond but much improved from yesterday.  Sister and cousin at bedside agreeing that he is improving.  No fevers, nausea, vomiting, shortness of breath.  Patient was able to scoot himself up in the bed which is an improvement from yesterday.  Is admitting to lower abdominal distention and tenderness from fluid.  Physical Exam:  Vitals:   12/19/23 0748 12/19/23 2041 12/20/23 0648 12/20/23 1026  BP: 116/69 (!) 146/82 111/78 130/77  Pulse: 85 89 79 77  Resp: 18 18 19 19   Temp: 98 F (36.7 C) 97.8 F (36.6 C) 97.9 F (36.6 C) 97.9 F (36.6 C)  TempSrc: Oral Oral Oral   SpO2: 94% 94% 97% 97%  Weight:      Height:  GENERAL:  Alert, lethargic, disheveled HEENT:  EOMI CARDIOVASCULAR:  RRR, systolic murmur appreciated RESPIRATORY:  Clear to auscultation, no wheezing, rales, or rhonchi GASTROINTESTINAL: Slightly more distended, minimally tender EXTREMITIES: Thin, no LE edema bilaterally NEURO:  No new focal deficits  appreciated SKIN:  No rashes noted PSYCH: Appropriate   Data Reviewed:  Imaging Studies: CT HEAD WO CONTRAST ( ) Result Date: 12/18/2023 CLINICAL DATA:  Head trauma, coagulopathy (Age 17-64y) EXAM: CT HEAD WITHOUT CONTRAST TECHNIQUE: Contiguous axial images were obtained from the base of the skull through the vertex without intravenous contrast. RADIATION DOSE REDUCTION: This exam was performed according to the departmental dose-optimization program which includes automated exposure control, adjustment of the mA and/or kV according to patient size and/or use of iterative reconstruction technique. COMPARISON:  None Available. FINDINGS: Mildly motion limited study. Brain: No evidence of acute infarction, hemorrhage, hydrocephalus, extra-axial collection or mass lesion/mass effect. Vascular: No hyperdense vessel. Skull: No acute fracture. Sinuses/Orbits: Clear sinuses.  No acute orbital findings. Other: No mastoid effusions. IMPRESSION: No evidence of acute intracranial abnormality. Electronically Signed   By: Gilmore GORMAN Molt M.D.   On: 12/18/2023 00:43   IR Paracentesis Result Date: 12/15/2023 INDICATION: 53 year old male. History of lupus, end-stage renal disease with recurrent ascites. Request for therapeutic paracentesis EXAM: ULTRASOUND GUIDED LEFT-SIDED THERAPEUTIC PARACENTESIS MEDICATIONS: Lidocaine  1% 10 mL COMPLICATIONS: None immediate. PROCEDURE: Informed written consent was obtained from the patient after a discussion of the risks, benefits and alternatives to treatment. A timeout was performed prior to the initiation of the procedure. Initial ultrasound scanning demonstrates a small amount of ascites within the right lower abdominal quadrant. The right lower abdomen was prepped and draped in the usual sterile fashion. 1% lidocaine  was used for local anesthesia. Following this, a 6 Fr Safe-T-Centesis catheter was introduced. An ultrasound image was saved for documentation purposes. The  paracentesis was performed. The catheter was removed and a dressing was applied. The patient tolerated the procedure well without immediate post procedural complication. FINDINGS: A total of approximately 1.8 L of straw-colored fluid was removed. IMPRESSION: Successful ultrasound-guided therapeutic left-sided paracentesis yielding 1.8 liters of peritoneal fluid. Performed by Delon Beagle NP Electronically Signed   By: CHRISTELLA.  Shick M.D.   On: 12/15/2023 10:41   ECHOCARDIOGRAM COMPLETE Result Date: 12/15/2023    ECHOCARDIOGRAM REPORT   Patient Name:   BEECHER FURIO Date of Exam: 12/15/2023 Medical Rec #:  [id number]       Height:       71.0 in Accession #:    [id number]      Weight:       168.4 lb Date of Birth:  1971-02-07       BSA:          1.960 m Patient Age:    53 years        BP:           107/74 mmHg Patient Gender: M               HR:           90 bpm. Exam Location:  Inpatient Procedure: 2D Echo, Cardiac Doppler and Color Doppler (Both Spectral and Color            Flow Doppler were utilized during procedure). Indications:    CHF Acute Systolic I50.21  History:        Patient has prior history of Echocardiogram examinations, most  recent 04/28/2020. CHF.  Sonographer:    Tinnie Gosling RDCS Referring Phys: [phone] ERIC CHEN IMPRESSIONS  1. Left ventricular ejection fraction, by estimation, is 55 to 60%. The left ventricle has normal function. The left ventricle has no regional wall motion abnormalities. There is severe concentric left ventricular hypertrophy. Left ventricular diastolic  parameters are consistent with Grade I diastolic dysfunction (impaired relaxation).  2. Right ventricular systolic function is normal. The right ventricular size is normal.  3. Left atrial size was moderately dilated.  4. Right atrial size was mildly dilated.  5. The mitral valve is normal in structure. Trivial mitral valve regurgitation. No evidence of mitral stenosis.  6. The aortic valve is tricuspid. There  is mild calcification of the aortic valve. Aortic valve regurgitation is not visualized. Aortic valve sclerosis/calcification is present, without any evidence of aortic stenosis.  7. Aortic dilatation noted. There is mild dilatation of the aortic root, measuring 40 mm.  8. The inferior vena cava is dilated in size with >50% respiratory variability, suggesting right atrial pressure of 8 mmHg. FINDINGS  Left Ventricle: Left ventricular ejection fraction, by estimation, is 55 to 60%. The left ventricle has normal function. The left ventricle has no regional wall motion abnormalities. The left ventricular internal cavity size was normal in size. There is  severe concentric left ventricular hypertrophy. Left ventricular diastolic parameters are consistent with Grade I diastolic dysfunction (impaired relaxation). Right Ventricle: The right ventricular size is normal. No increase in right ventricular wall thickness. Right ventricular systolic function is normal. Left Atrium: Left atrial size was moderately dilated. Right Atrium: Right atrial size was mildly dilated. Pericardium: There is no evidence of pericardial effusion. Mitral Valve: The mitral valve is normal in structure. Trivial mitral valve regurgitation. No evidence of mitral valve stenosis. MV peak gradient, 4.2 mmHg. The mean mitral valve gradient is 3.0 mmHg. Tricuspid Valve: The tricuspid valve is normal in structure. Tricuspid valve regurgitation is mild . No evidence of tricuspid stenosis. Aortic Valve: The aortic valve is tricuspid. There is mild calcification of the aortic valve. Aortic valve regurgitation is not visualized. Aortic valve sclerosis/calcification is present, without any evidence of aortic stenosis. Pulmonic Valve: The pulmonic valve was normal in structure. Pulmonic valve regurgitation is trivial. No evidence of pulmonic stenosis. Aorta: Aortic dilatation noted. There is mild dilatation of the aortic root, measuring 40 mm. Venous: The  inferior vena cava is dilated in size with greater than 50% respiratory variability, suggesting right atrial pressure of 8 mmHg. IAS/Shunts: No atrial level shunt detected by color flow Doppler.  LEFT VENTRICLE PLAX 2D LVIDd:         4.60 cm   Diastology LVIDs:         3.20 cm   LV e' lateral:   8.16 cm/s LV PW:         2.10 cm   LV E/e' lateral: 13.1 LV IVS:        2.00 cm LVOT diam:     2.10 cm LV SV:         79 LV SV Index:   40 LVOT Area:     3.46 cm  RIGHT VENTRICLE             IVC RV S prime:     14.30 cm/s  IVC diam: 2.30 cm TAPSE (M-mode): 2.1 cm LEFT ATRIUM             Index        RIGHT ATRIUM  Index LA diam:        3.70 cm 1.89 cm/m   RA Area:     18.20 cm LA Vol (A2C):   76.1 ml 38.82 ml/m  RA Volume:   44.90 ml  22.91 ml/m LA Vol (A4C):   91.2 ml 46.53 ml/m LA Biplane Vol: 93.0 ml 47.45 ml/m  AORTIC VALVE LVOT Vmax:   127.00 cm/s LVOT Vmean:  80.100 cm/s LVOT VTI:    0.229 m  AORTA Ao Root diam: 4.00 cm Ao Asc diam:  3.70 cm MITRAL VALVE MV Area (PHT): 4.65 cm     SHUNTS MV Area VTI:   3.34 cm     Systemic VTI:  0.23 m MV Peak grad:  4.2 mmHg     Systemic Diam: 2.10 cm MV Mean grad:  3.0 mmHg MV Vmax:       1.03 m/s MV Vmean:      83.0 cm/s MV Decel Time: 163 msec MV E velocity: 107.00 cm/s MV A velocity: 117.00 cm/s MV E/A ratio:  0.91 Toribio Fuel MD Electronically signed by Toribio Fuel MD Signature Date/Time: 12/15/2023/9:40:29 AM    Final    CT ABDOMEN PELVIS W CONTRAST Result Date: 12/14/2023 CLINICAL DATA:  Right lower quadrant abdominal pain for 2 days. Nausea and vomiting. Hemodialysis patient. EXAM: CT ABDOMEN AND PELVIS WITH CONTRAST TECHNIQUE: Multidetector CT imaging of the abdomen and pelvis was performed using the standard protocol following bolus administration of intravenous contrast. RADIATION DOSE REDUCTION: This exam was performed according to the departmental dose-optimization program which includes automated exposure control, adjustment of the mA and/or  kV according to patient size and/or use of iterative reconstruction technique. CONTRAST:  75mL OMNIPAQUE  IOHEXOL  350 MG/ML SOLN COMPARISON:  Abdominopelvic CT 08/04/2021 and 06/29/2021. FINDINGS: Lower chest: Interval improved aeration of the left lung base with mild residual atelectasis or scarring. No confluent airspace disease or significant pleural effusion. Moderate cardiomegaly without significant pericardial fluid. There is coronary artery atherosclerosis with calcifications of the mitral annulus. Hepatobiliary: The liver is normal in density without suspicious focal abnormality. No evidence of gallstones, gallbladder wall thickening or biliary dilatation. Pancreas: Unremarkable. No pancreatic ductal dilatation or surrounding inflammatory changes. Spleen: Normal in size without focal abnormality. Adrenals/Urinary Tract: Both adrenal glands appear normal. Moderate to severe renal cortical thinning and atrophy bilaterally, progressive from previous examination. No significant contrast excretion is demonstrated on the delayed images. There is no hydronephrosis or urinary tract calculus. The bladder appears unremarkable for its degree of distention. Stomach/Bowel: No enteric contrast administered. The stomach appears unremarkable for its degree of distention. Mild small bowel wall thickening with mucosal hyperenhancement in the mid abdomen. The appendix is not clearly seen on the current study, although there is no pericecal inflammation to suggest appendicitis. Mild diffuse colonic wall thickening. There are diverticular changes throughout the descending and sigmoid colon without focal surrounding inflammation. No evidence of bowel obstruction or perforation. Vascular/Lymphatic: Scattered prominent retroperitoneal lymph nodes, similar to previous study and likely reactive. Aortic and branch vessel atherosclerosis without evidence of aneurysm or large vessel occlusion. The portal, superior mesenteric and splenic  veins are patent. Reproductive: The prostate gland and seminal vesicles appear unremarkable. Other: Moderate to large volume ascites again noted, similar in overall volume to the previous CT. There is apparent new diffuse peritoneal thickening without apparent nodularity. A large amount of fluid extends into the fall canal and screw, incompletely visualized but increased in volume from the previous study. No herniated bowel, organized fluid collection or  pneumoperitoneum. Musculoskeletal: Chronic femoral head osteonecrosis bilaterally without subchondral collapse or significant secondary degenerative changes. Mild lumbar spondylosis. No acute osseous findings are identified. Generalized subcutaneous edema appears similar to previous CT. IMPRESSION: 1. Moderate to large volume ascites, similar in overall volume to the previous CT. There is new diffuse smooth peritoneal thickening without apparent nodularity, suspicious for peritonitis. Consider diagnostic paracentesis. 2. Mild small bowel and colonic wall thickening, nonspecific in the setting of ascites. No evidence of bowel obstruction or perforation. The appendix is not discretely visualized, although there are no specific signs of appendicitis. 3. Progressive renal cortical thinning and atrophy bilaterally. No hydronephrosis or urinary tract calculus. 4. Chronic femoral head osteonecrosis bilaterally without subchondral collapse or significant secondary degenerative changes. 5.  Aortic Atherosclerosis (ICD10-I70.0). Electronically Signed   By: Elsie Perone M.D.   On: 12/14/2023 14:56   DG Chest 2 View Result Date: 12/14/2023 CLINICAL DATA:  SHOB EXAM: CHEST - 2 VIEW COMPARISON:  June 29, 2021 FINDINGS: Lower lung volumes . Bilateral perihilar interstitial opacities. No pneumothorax. Trace left pleural effusion. Mild cardiomegaly. Tortuous aorta with aortic atherosclerosis. No acute fracture or destructive lesions. Multilevel thoracic osteophytosis.  IMPRESSION: Bilateral perihilar interstitial opacities, which may represent bronchovascular crowding due to low lung volumes, interstitial edema, or atypical/viral infection, in the correct clinical context. Electronically Signed   By: Rogelia Myers M.D.   On: 12/14/2023 13:09    There are no new results to review at this time.  Previous records (including but not limited to H&P, progress notes, nursing notes, TOC management) were reviewed in assessment of this patient.  Labs: CBC: Recent Labs  Lab 12/15/23 0412 12/16/23 0403 12/17/23 0502 12/19/23 0834 12/20/23 0542  WBC 3.9* 5.0 5.3 3.0* 2.9*  NEUTROABS 3.4  --   --   --   --   HGB 11.1* 10.2* 10.2* 10.3* 9.9*  HCT 34.8* 32.3* 32.1* 32.8* 30.9*  MCV 88.5 90.2 89.2 89.1 87.8  PLT 64* 73* 86* 97* 91*   Basic Metabolic Panel: Recent Labs  Lab 12/15/23 0412 12/16/23 0403 12/17/23 0502 12/19/23 0834 12/20/23 0542  NA 132* 130* 131* 133* 129*  K 4.6 5.6* 4.3 4.1 3.9  CL 94* 90* 94* 95* 96*  CO2 23 24 24 25 26   GLUCOSE 51* 112* 77 86 137*  BUN 36* 46* 28* 33* 45*  CREATININE 5.94* 7.12* 4.97* 5.32* 6.28*  CALCIUM  7.5* 7.3* 7.6* 7.9* 7.7*  MG  --  1.7 1.7 1.9 2.0  PHOS  --  7.4* 4.7* 4.3 4.0   Liver Function Tests: Recent Labs  Lab 12/14/23 1150 12/15/23 0412 12/16/23 0403 12/19/23 0834 12/20/23 0542  AST 34 43* 41 28 31  ALT 21 20 23 16 15   ALKPHOS 68 47 41 45 49  BILITOT 1.0 0.5 0.7 1.1 0.5  PROT 8.3* 6.5 6.8 6.6 6.0*  ALBUMIN  2.2* 1.7* 1.6* <1.5* <1.5*   CBG: Recent Labs  Lab 12/17/23 2014  GLUCAP 79    Scheduled Meds:  carvedilol   25 mg Oral BID   Chlorhexidine  Gluconate Cloth  6 each Topical Q0600   folic acid   1 mg Oral Daily   levothyroxine   50 mcg Oral QAC breakfast   lidocaine -EPINEPHrine   30 mL Infiltration Once   multivitamin with minerals  1 tablet Oral Daily   polyethylene glycol  17 g Oral Daily   thiamine   100 mg Oral Daily   Or   thiamine   100 mg Intravenous Daily   Continuous  Infusions:  cefTRIAXone  (  ROCEPHIN )  IV 2 g (12/20/23 0845)   PRN Meds:.acetaminophen  **OR** acetaminophen , albuterol , bisacodyl , bisacodyl , melatonin, ondansetron  **OR** ondansetron  (ZOFRAN ) IV  Family Communication: Family at bedside  Disposition: Status is: Inpatient Remains inpatient appropriate because: ESRD, SBP, encephalopathy     Time spent: 49 minutes  Length of inpatient stay: 6 days  Author: Carliss LELON Canales, DO 12/20/2023 12:55 PM  For on call review [email or web address].

## 2023-12-20 NOTE — Evaluation (Signed)
 Physical Therapy Evaluation Patient Details Name: [name] MRN: [id number] DOB: 06-13-1970 Today's Date: 12/20/2023  History of Present Illness  Pt is a 53 y/o M admitted on 12/14/23 after presenting with c/o abdominal pain. Pt is being treated for spontaneous bacterial peritonitis, acute metabolic encephalopathy. PMH: ESRD on HD TTS, lupus, ascites, systolic CHF  Clinical Impression  Pt seen for PT evaluation with cousin Mickie) present & providing PLOF, home set up information. Pt asleep, does respond when spoken to but minimal eye opening. Pt requires max assist for supine>sit, mod assist sit>supine. Pt sits EOB with R lateral lean, up to max assist, fade to min assist. Transfers deferred 2/2 ongoing lethargy. Encouraged Lene to keep lights & TV on, windows open during day to promote sleep/wake cycle as Lene reports pt has been asleep most of the day. Recommend ongoing PT services to progress mobility as able. Recommend post acute rehab <3 hours therapy/day upon d/c.        If plan is discharge home, recommend the following: Two people to help with bathing/dressing/bathroom;Two people to help with walking and/or transfers;Direct supervision/assist for medications management;Help with stairs or ramp for entrance;Assist for transportation;Assistance with feeding;Assistance with cooking/housework;Direct supervision/assist for financial management;Supervision due to cognitive status   Can travel by private vehicle   No    Equipment Recommendations Other (comment) (defer to next venue)  Recommendations for Other Services       Functional Status Assessment Patient has had a recent decline in their functional status and demonstrates the ability to make significant improvements in function in a reasonable and predictable amount of time.     Precautions / Restrictions Precautions Precautions: Fall Restrictions Weight Bearing Restrictions Per Provider Order: No      Mobility  Bed  Mobility Overal bed mobility: Needs Assistance Bed Mobility: Supine to Sit, Sit to Supine, Rolling Rolling: Min assist (roll L with bed rails, cuing re: hand placement/technique)   Supine to sit: Max assist, HOB elevated, Used rails (max cuing re: technique but poor return demo, assistance to elevate trunk to sitting) Sit to supine: Mod assist, HOB elevated, Used rails        Transfers                        Ambulation/Gait                  Stairs            Wheelchair Mobility     Tilt Bed    Modified Rankin (Stroke Patients Only)       Balance Overall balance assessment: Needs assistance Sitting-balance support: Feet supported, Bilateral upper extremity supported Sitting balance-Leahy Scale: Poor Sitting balance - Comments: decreased righting reactions Postural control: Right lateral lean                                   Pertinent Vitals/Pain Pain Assessment Pain Assessment: Faces Faces Pain Scale: Hurts even more Pain Location: generalized, pain to touch Pain Descriptors / Indicators: Grimacing, Guarding, Discomfort Pain Intervention(s): Monitored during session, Repositioned, Limited activity within patient's tolerance    Home Living Family/patient expects to be discharged to:: Private residence Living Arrangements: Alone Available Help at Discharge: Family;Available PRN/intermittently Type of Home: House Home Access: Stairs to enter   Entrance Stairs-Number of Steps: 3   Home Layout: Two level;Able to live on main level with bedroom/bathroom  Home Equipment: None Additional Comments: lives alone, has sister who can help occastionally    Prior Function Prior Level of Function : Working/employed;Driving;Independent/Modified Independent             Mobility Comments: 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), driving, has 2 cats, independent ADLs Comments: independent     Extremity/Trunk Assessment   Upper  Extremity Assessment Upper Extremity Assessment: Generalized weakness    Lower Extremity Assessment Lower Extremity Assessment: Generalized weakness    Cervical / Trunk Assessment Cervical / Trunk Assessment:  (downward head, unable to hold it upright/neutral)  Communication   Communication Communication: Impaired Factors Affecting Communication: Reduced clarity of speech    Cognition Arousal: Lethargic Behavior During Therapy: Flat affect   PT - Cognitive impairments: Awareness, Orientation, Attention, Initiation, Sequencing, Problem solving, Safety/Judgement, Memory                       PT - Cognition Comments: but cousin reporting cognition has improved since yesterday Following commands: Impaired Following commands impaired: Follows one step commands inconsistently, Follows one step commands with increased time     Cueing Cueing Techniques: Verbal cues, Gestural cues, Tactile cues     General Comments      Exercises     Assessment/Plan    PT Assessment Patient needs continued PT services  PT Problem List Decreased strength;Decreased coordination;Cardiopulmonary status limiting activity;Pain;Decreased range of motion;Decreased cognition;Impaired sensation;Decreased knowledge of use of DME;Decreased activity tolerance;Decreased safety awareness;Decreased balance;Decreased mobility;Decreased knowledge of precautions       PT Treatment Interventions DME instruction;Balance training;Gait training;Neuromuscular re-education;Stair training;Functional mobility training;Therapeutic activities;Therapeutic exercise;Patient/family education;Cognitive remediation;Manual techniques    PT Goals (Current goals can be found in the Care Plan section)  Acute Rehab PT Goals Patient Stated Goal: get better, return to PLOF PT Goal Formulation: With family Time For Goal Achievement: 01/03/24 Potential to Achieve Goals: Fair    Frequency Min 3X/week     Co-evaluation                AM-PAC PT 6 Clicks Mobility  Outcome Measure Help needed turning from your back to your side while in a flat bed without using bedrails?: A Lot Help needed moving from lying on your back to sitting on the side of a flat bed without using bedrails?: Total Help needed moving to and from a bed to a chair (including a wheelchair)?: Total Help needed standing up from a chair using your arms (e.g., wheelchair or bedside chair)?: Total Help needed to walk in hospital room?: Total Help needed climbing 3-5 steps with a railing? : Total 6 Click Score: 7    End of Session   Activity Tolerance: Treatment limited secondary to medical complications (Comment);Patient limited by lethargy Patient left: in bed;with call bell/phone within reach;with bed alarm set;with family/visitor present   PT Visit Diagnosis: Unsteadiness on feet (R26.81);Other abnormalities of gait and mobility (R26.89);Difficulty in walking, not elsewhere classified (R26.2);Muscle weakness (generalized) (M62.81)    Time: 8495-8481 PT Time Calculation (min) (ACUTE ONLY): 14 min   Charges:   PT Evaluation $PT Eval Moderate Complexity: 1 Mod   PT General Charges $$ ACUTE PT VISIT: 1 Visit         Richerd Pinal, PT, DPT 12/20/23, 3:58 PM   Richerd CHRISTELLA Pinal 12/20/2023, 3:56 PM

## 2023-12-20 NOTE — Progress Notes (Signed)
  [facility] Progress Note   Subjective:  Seen in room. Family visiting. Mentation slowly improving.   Objective Vitals:   12/19/23 0454 12/19/23 0748 12/19/23 2041 12/20/23 0648  BP: 135/83 116/69 (!) 146/82 111/78  Pulse: 88 85 89 79  Resp: 18 18 18 19   Temp: 98 F (36.7 C) 98 F (36.7 C) 97.8 F (36.6 C) 97.9 F (36.6 C)  TempSrc: Oral Oral Oral Oral  SpO2: 94% 94% 94% 97%  Weight:      Height:       Physical Exam Gen: Lying in bed, nad  Pulm: Normal wob CV: RRR Abd: soft, non-tender Ext: 1+ LE edema Access: LUE AVF +bruit.   Additional Objective Labs: Basic Metabolic Panel: Recent Labs  Lab 12/17/23 0502 12/19/23 0834 12/20/23 0542  NA 131* 133* 129*  K 4.3 4.1 3.9  CL 94* 95* 96*  CO2 24 25 26   GLUCOSE 77 86 137*  BUN 28* 33* 45*  CREATININE 4.97* 5.32* 6.28*  CALCIUM  7.6* 7.9* 7.7*  PHOS 4.7* 4.3 4.0   Liver Function Tests: Recent Labs  Lab 12/16/23 0403 12/19/23 0834 12/20/23 0542  AST 41 28 31  ALT 23 16 15   ALKPHOS 41 45 49  BILITOT 0.7 1.1 0.5  PROT 6.8 6.6 6.0*  ALBUMIN  1.6* <1.5* <1.5*   Recent Labs  Lab 12/14/23 1150  LIPASE 31   CBC: Recent Labs  Lab 12/15/23 0412 12/16/23 0403 12/17/23 0502 12/19/23 0834 12/20/23 0542  WBC 3.9* 5.0 5.3 3.0* 2.9*  NEUTROABS 3.4  --   --   --   --   HGB 11.1* 10.2* 10.2* 10.3* 9.9*  HCT 34.8* 32.3* 32.1* 32.8* 30.9*  MCV 88.5 90.2 89.2 89.1 87.8  PLT 64* 73* 86* 97* 91*   Medications:  cefTRIAXone  (ROCEPHIN )  IV 2 g (12/20/23 0845)    carvedilol   25 mg Oral BID   Chlorhexidine  Gluconate Cloth  6 each Topical Q0600   Chlorhexidine  Gluconate Cloth  6 each Topical Q0600   folic acid   1 mg Oral Daily   levothyroxine   50 mcg Oral QAC breakfast   lidocaine -EPINEPHrine   30 mL Infiltration Once   multivitamin with minerals  1 tablet Oral Daily   polyethylene glycol  17 g Oral Daily   thiamine   100 mg Oral Daily   Or   thiamine   100 mg Intravenous Daily   Home bp  meds: Coreg  25 bid Hydralazine 25 bid (not taking) Irbesartan  300 mg every day      OP HD: SW TTS 3h  B400  71kg   2K bath  AVF   Heparin  none     Assessment/ Plan: Peritonitis.  CT showed findings suspicious for SBP. Fluid cx + Klebsiella. On ceftriaxone . Per primary  Acute encephalopathy. c/w alcohol withdrawal. On CIWA protocol  ESRD: HD TTS. Next HD Tues.  HTN. BP acceptable Continue home meds.  Volume: Does have scrotal edema; Challenge EDW as able.  Anemia. Hgb 10.  no esa needs. Follow.  BMD: Ca/phos acceptable. Follow trends.  Recurrent ascites: has been getting paracenteses since 2022. Had paracentesis 8/20 and they removed 1.6L.   Angel Ronnald Acosta PA-C [facility] 12/20/2023,9:56 AM

## 2023-12-20 NOTE — Plan of Care (Signed)
  Problem: Education: Goal: Knowledge of General Education information will improve Description: Including pain rating scale, medication(s)/side effects and non-pharmacologic comfort measures 12/20/2023 0417 by Bluford Niels CROME, RN Outcome: Progressing 12/20/2023 0417 by Bluford Niels CROME, RN Outcome: Progressing   Problem: Health Behavior/Discharge Planning: Goal: Ability to manage health-related needs will improve 12/20/2023 0417 by Bluford Niels CROME, RN Outcome: Progressing 12/20/2023 0417 by Bluford Niels CROME, RN Outcome: Progressing   Problem: Clinical Measurements: Goal: Ability to maintain clinical measurements within normal limits will improve 12/20/2023 0417 by Bluford Niels CROME, RN Outcome: Progressing 12/20/2023 0417 by Bluford Niels CROME, RN Outcome: Progressing Goal: Will remain free from infection 12/20/2023 0417 by Bluford Niels CROME, RN Outcome: Progressing 12/20/2023 0417 by Bluford Niels CROME, RN Outcome: Progressing Goal: Diagnostic test results will improve 12/20/2023 0417 by Bluford Niels CROME, RN Outcome: Progressing 12/20/2023 0417 by Bluford Niels CROME, RN Outcome: Progressing Goal: Respiratory complications will improve 12/20/2023 0417 by Bluford Niels CROME, RN Outcome: Progressing 12/20/2023 0417 by Bluford Niels CROME, RN Outcome: Progressing Goal: Cardiovascular complication will be avoided 12/20/2023 0417 by Bluford Niels CROME, RN Outcome: Progressing 12/20/2023 0417 by Bluford Niels CROME, RN Outcome: Progressing   Problem: Activity: Goal: Risk for activity intolerance will decrease 12/20/2023 0417 by Bluford Niels CROME, RN Outcome: Progressing 12/20/2023 0417 by Bluford Niels CROME, RN Outcome: Progressing   Problem: Nutrition: Goal: Adequate nutrition will be maintained 12/20/2023 0417 by Bluford Niels CROME, RN Outcome: Progressing 12/20/2023 0417 by Bluford Niels CROME, RN Outcome: Progressing   Problem: Coping: Goal: Level of anxiety will decrease 12/20/2023 0417 by Bluford Niels CROME, RN Outcome: Progressing 12/20/2023 0417 by Bluford Niels CROME, RN Outcome: Progressing   Problem: Elimination: Goal: Will not experience complications related to bowel motility 12/20/2023 0417 by Bluford Niels CROME, RN Outcome: Progressing 12/20/2023 0417 by Bluford Niels CROME, RN Outcome: Progressing Goal: Will not experience complications related to urinary retention 12/20/2023 0417 by Bluford Niels CROME, RN Outcome: Progressing 12/20/2023 0417 by Bluford Niels CROME, RN Outcome: Progressing   Problem: Pain Managment: Goal: General experience of comfort will improve and/or be controlled 12/20/2023 0417 by Bluford Niels CROME, RN Outcome: Progressing 12/20/2023 0417 by Bluford Niels CROME, RN Outcome: Progressing   Problem: Safety: Goal: Ability to remain free from injury will improve 12/20/2023 0417 by Bluford Niels CROME, RN Outcome: Progressing 12/20/2023 0417 by Bluford Niels CROME, RN Outcome: Progressing   Problem: Skin Integrity: Goal: Risk for impaired skin integrity will decrease 12/20/2023 0417 by Bluford Niels CROME, RN Outcome: Progressing 12/20/2023 0417 by Bluford Niels CROME, RN Outcome: Progressing   Problem: Safety: Goal: Non-violent Restraint(s) 12/20/2023 0417 by Bluford Niels CROME, RN Outcome: Progressing 12/20/2023 0417 by Bluford Niels CROME, RN Outcome: Progressing   Problem: Safety: Goal: Non-violent Restraint(s) 12/20/2023 0417 by Bluford Niels CROME, RN Outcome: Progressing 12/20/2023 0417 by Bluford Niels CROME, RN Outcome: Progressing

## 2023-12-21 ENCOUNTER — Inpatient Hospital Stay (HOSPITAL_COMMUNITY)

## 2023-12-21 DIAGNOSIS — N186 End stage renal disease: Secondary | ICD-10-CM | POA: Diagnosis not present

## 2023-12-21 DIAGNOSIS — M3213 Lung involvement in systemic lupus erythematosus: Secondary | ICD-10-CM | POA: Diagnosis not present

## 2023-12-21 DIAGNOSIS — K652 Spontaneous bacterial peritonitis: Secondary | ICD-10-CM | POA: Diagnosis not present

## 2023-12-21 DIAGNOSIS — E039 Hypothyroidism, unspecified: Secondary | ICD-10-CM | POA: Diagnosis not present

## 2023-12-21 HISTORY — PX: IR PARACENTESIS: IMG2679

## 2023-12-21 LAB — CBC
HCT: 31 % — ABNORMAL LOW (ref 39.0–52.0)
Hemoglobin: 9.9 g/dL — ABNORMAL LOW (ref 13.0–17.0)
MCH: 27.7 pg (ref 26.0–34.0)
MCHC: 31.9 g/dL (ref 30.0–36.0)
MCV: 86.8 fL (ref 80.0–100.0)
Platelets: 107 K/uL — ABNORMAL LOW (ref 150–400)
RBC: 3.57 MIL/uL — ABNORMAL LOW (ref 4.22–5.81)
RDW: 18 % — ABNORMAL HIGH (ref 11.5–15.5)
WBC: 5.3 K/uL (ref 4.0–10.5)
nRBC: 0 % (ref 0.0–0.2)

## 2023-12-21 LAB — BASIC METABOLIC PANEL WITH GFR
Anion gap: 13 (ref 5–15)
BUN: 54 mg/dL — ABNORMAL HIGH (ref 6–20)
CO2: 25 mmol/L (ref 22–32)
Calcium: 8 mg/dL — ABNORMAL LOW (ref 8.9–10.3)
Chloride: 90 mmol/L — ABNORMAL LOW (ref 98–111)
Creatinine, Ser: 7.19 mg/dL — ABNORMAL HIGH (ref 0.61–1.24)
GFR, Estimated: 8 mL/min — ABNORMAL LOW (ref >60)
Glucose, Bld: 86 mg/dL (ref 70–99)
Potassium: 4.4 mmol/L (ref 3.5–5.1)
Sodium: 128 mmol/L — ABNORMAL LOW (ref 135–145)

## 2023-12-21 LAB — MAGNESIUM: Magnesium: 2.2 mg/dL (ref 1.7–2.4)

## 2023-12-21 LAB — GLUCOSE, CAPILLARY: Glucose-Capillary: 73 mg/dL (ref 70–99)

## 2023-12-21 LAB — SEDIMENTATION RATE: Sed Rate: 23 mm/h — ABNORMAL HIGH (ref 0–16)

## 2023-12-21 MED ORDER — LIDOCAINE-EPINEPHRINE 1 %-1:100000 IJ SOLN
INTRAMUSCULAR | Status: AC
Start: 2023-12-21 — End: 2023-12-21
  Filled 2023-12-21: qty 1

## 2023-12-21 MED ORDER — CEFAZOLIN SODIUM-DEXTROSE 1-4 GM/50ML-% IV SOLN
1.0000 g | INTRAVENOUS | Status: DC
  Administered 2023-12-22 – 2023-12-28 (×7): 1 g via INTRAVENOUS
  Filled 2023-12-21 (×8): qty 50

## 2023-12-21 MED ORDER — MENTHOL 3 MG MT LOZG
1.0000 | LOZENGE | OROMUCOSAL | Status: DC | PRN
  Administered 2023-12-22: 3 mg via ORAL
  Filled 2023-12-21: qty 9

## 2023-12-21 MED ORDER — LIDOCAINE-EPINEPHRINE 1 %-1:100000 IJ SOLN
10.0000 mL | Freq: Once | INTRAMUSCULAR | Status: DC
  Filled 2023-12-21: qty 20

## 2023-12-21 MED ORDER — HYDROCODONE-ACETAMINOPHEN 5-325 MG PO TABS
1.0000 | ORAL_TABLET | Freq: Three times a day (TID) | ORAL | Status: DC | PRN
  Administered 2023-12-21 – 2023-12-22 (×4): 1 via ORAL
  Filled 2023-12-21 (×4): qty 1

## 2023-12-21 NOTE — Progress Notes (Signed)
 PT Cancellation Note  Patient Details Name: [name] MRN: [id number] DOB: 07-Oct-1970   Cancelled Treatment:    Reason Eval/Treat Not Completed: (P) Fatigue/lethargy limiting ability to participate;Pain limiting ability to participate;Other (comment). Presumed family member in room politely declining PT attempts this date due to pt being very lethargic, lupus flair up, and pt being in a lot of pain currently with plans to go to HD soon. Notified RN of request for pain meds. Encouraged attempts at mobility when able to reduce further deconditioning. She verbalized understanding. Will plan to follow-up tomorrow per their request.   Theo Ferretti, PT, DPT Acute Rehabilitation Services  Office: [phone]    Theo CHRISTELLA Ferretti 12/21/2023, 11:13 AM

## 2023-12-21 NOTE — Procedures (Signed)
 PROCEDURE SUMMARY:  Successful US  guided paracentesis from right lateral abdomen.  Yielded 700 mL of cloudy yellow fluid.  No immediate complications.  Patient tolerated well.  EBL = trace  Ellora Varnum CHRISTELLA Bal PA-C 12/21/2023 9:10 AM

## 2023-12-21 NOTE — Progress Notes (Signed)
 [facility] Progress Note   Subjective:  Seen in room. Family visiting. His mentation appears to be closer to baseline. His sister mentioned that she thinks that he is starting to have a lupus flare up due to his joints hurting. He normally is on Plaquenil  but is not receiving it currently. BP acceptable. Plan for paracentesis this morning. Abdomen tender to touch. Plan for HD today after paracentesis.   Objective Vitals:   12/20/23 1652 12/20/23 1937 12/20/23 2143 12/21/23 0831  BP: 112/65 106/68 112/68 112/74  Pulse: 77 79 81 75  Resp: 18 18  19   Temp: 98 F (36.7 C) (!) 97.5 F (36.4 C)  97.6 F (36.4 C)  TempSrc:  Oral  Oral  SpO2: 95% 94%  97%  Weight:      Height:       Physical Exam Gen: Lying in bed, nad  Pulm: Normal wob CV: RRR Abd: soft, tender to the touch Ext: 1+ LE edema Access: LUE AVF +bruit.   Additional Objective Labs: Basic Metabolic Panel: Recent Labs  Lab 12/17/23 0502 12/19/23 0834 12/20/23 0542 12/21/23 0509  NA 131* 133* 129* 128*  K 4.3 4.1 3.9 4.4  CL 94* 95* 96* 90*  CO2 24 25 26 25   GLUCOSE 77 86 137* 86  BUN 28* 33* 45* 54*  CREATININE 4.97* 5.32* 6.28* 7.19*  CALCIUM  7.6* 7.9* 7.7* 8.0*  PHOS 4.7* 4.3 4.0  --    Liver Function Tests: Recent Labs  Lab 12/16/23 0403 12/19/23 0834 12/20/23 0542  AST 41 28 31  ALT 23 16 15   ALKPHOS 41 45 49  BILITOT 0.7 1.1 0.5  PROT 6.8 6.6 6.0*  ALBUMIN  1.6* <1.5* <1.5*   Recent Labs  Lab 12/14/23 1150  LIPASE 31   CBC: Recent Labs  Lab 12/15/23 0412 12/16/23 0403 12/17/23 0502 12/19/23 0834 12/20/23 0542 12/21/23 0509  WBC 3.9* 5.0 5.3 3.0* 2.9* 5.3  NEUTROABS 3.4  --   --   --   --   --   HGB 11.1* 10.2* 10.2* 10.3* 9.9* 9.9*  HCT 34.8* 32.3* 32.1* 32.8* 30.9* 31.0*  MCV 88.5 90.2 89.2 89.1 87.8 86.8  PLT 64* 73* 86* 97* 91* 107*   Medications:  cefTRIAXone  (ROCEPHIN )  IV 2 g (12/21/23 0819)    carvedilol   25 mg Oral BID   Chlorhexidine  Gluconate Cloth   6 each Topical Q0600   folic acid   1 mg Oral Daily   levothyroxine   50 mcg Oral QAC breakfast   lidocaine -EPINEPHrine   10 mL Intradermal Once   lidocaine -EPINEPHrine   30 mL Infiltration Once   multivitamin with minerals  1 tablet Oral Daily   polyethylene glycol  17 g Oral Daily   thiamine   100 mg Oral Daily   Or   thiamine   100 mg Intravenous Daily   Home bp meds: Coreg  25 bid Hydralazine 25 bid (not taking) Irbesartan  300 mg every day      OP HD: SW TTS 3h  B400  71kg   2K bath  AVF   Heparin  none     Assessment/ Plan: Peritonitis.  CT showed findings suspicious for SBP. Fluid cx + Klebsiella. On ceftriaxone . Per primary  Acute encephalopathy. c/w alcohol withdrawal. On CIWA protocol  ESRD: HD TTS. Next HD Tues.  HTN. BP acceptable Continue home meds.  Volume: Does have scrotal edema; Challenge EDW as able.  Anemia. Hgb 10.  no esa needs. Follow.  Lupus: followed by rheumatology. Previously on Plaquenil   200 mg three times per week. With check ds-DNA, complement levels, and ESR today.  BMD: Ca/phos acceptable. Follow trends.  Recurrent ascites: has been getting paracenteses since 2022. Had paracentesis 8/20 and they removed 1.6L.   Belvie Och, NP [facility] 12/21/2023,9:15 AM

## 2023-12-21 NOTE — TOC Progression Note (Signed)
 Transition of Care [facility]) - Progression Note    Patient Details  Name: [name] MRN: [id number] Date of Birth: [birth year]  [facility]) CM/SW Contact  Lendia Dais, [place] Phone Number: 12/21/2023, 1:40 PM  Clinical Narrative:  CSW spoke to patient and pt's cousin Lean at bedside about physical therapy recommendations.  CSW explained that the patient would temporarily stay at a SNF to receive a PT before going home to get back to his baseline of mobility. The pt stated that would be willing to attend a SNF but wanted to review medicare list before referrals are sent out in the HUB.  Pt asked about HH and OPPT, CSW stated that they would return with a better answer after speaking with the RNCM.  1400 - CSW provided cousin with Medicare choice list. Pt was is currently receiving dialysis.    Expected Discharge Plan: Home/Self Care Barriers to Discharge: Continued Medical Work up               Expected Discharge Plan and Services In-house Referral: Clinical Social Work     Living arrangements for the past 2 months: Single Family Home                                       Social Drivers of Health (SDOH) Interventions SDOH Screenings   Food Insecurity: No Food Insecurity (12/15/2023)  Housing: Low Risk  (12/15/2023)  Transportation Needs: No Transportation Needs (12/15/2023)  Utilities: Not At Risk (12/15/2023)  Depression (PHQ2-9): Low Risk  (01/02/2022)  Tobacco Use: Low Risk  (12/14/2023)    Readmission Risk Interventions     No data to display

## 2023-12-21 NOTE — Progress Notes (Signed)
 Progress Note   Patient: [name] FMW:[id number] DOB: May 01, 1970 DOA: 12/14/2023  DOS: the patient was seen and examined on 12/21/2023   Brief hospital course:  53 year old male with a history of end-stage renal disease on hemodialysis Tuesday Thursday Saturday, history of lupus, history of ascites without a diagnosis of cirrhosis, history of systolic congestive heart EF of 35 to 40% who presents to the ER with a sudden onset of abdominal pain that started last night    Assessment and Plan:   Acute metabolic encephalopathy-likely alcohol withdrawal - Beginning 8/22 patient appeared very anxious, delirious, paranoid, even hallucinating.  Etiology unclear.  Differentials include delirium, alcohol withdrawal, metabolic etiology.  Ammonia normal.  Discussion with sister bedside stating that patient does drink to cover pain from his lupus.  Although he has reduced his alcohol consumption.  Currently on CIWA protocol.  Appears mildly improved today.  Family at bedside providing supportive care and reorientation which is a big help.  More alert and talkative.  Will be very sparing with any sedative type medications are needed.    Spontaneous bacterial peritonitis - Initial peritoneal fluid collection noting greater than 3600 WBCs, 95% neutrophils.  Culture noting Klebsiella.  Transition ceftriaxone  to cefazolin  (day 8 of 10-14).     Large volume ascites - IR paracentesis performed 8/20 with 1.8 L removed.  Patient having worsening abdominal tightness and tenderness.  ordered repeat therapeutic paracentesis yesterday, performed today yielding only .  Etiology likely from severely low albumin .   ESRD - HD TTS.  Nephrology on board following closely.   HFpEF - Appears euvolemic, although scrotal edema persists.  Volume per nephrology/HD/UF.   Hyperkalemia - Received 1 dose of p.o. Lokelma .  Managed via HD.   Hypertension - Coreg  on board.   Hypothyroidism - TSH 7.89.  Initiated on  Synthroid  50 micrograms daily.   Chronic pancytopenia - Continue to monitor.  SCDs for DVT prophylaxis.   Systemic lupus erythematosus with lung involvement - Stable.  Holding Plaquenil  due to infection.  Discussed with sister about ongoing need to follow-up closely as lupus appears to be underlying cause of many of his comorbidities both directly and indirectly.  He states he may be having a flair as he is having diffuse worsening joint pain.  Inflammatory labs ordered by nephrology.  Can consider steroid or restarting other dmard therapy.    Goals of care - Patient has multiple comorbidities, very frail, low albumin , poor p.o. intake, very low albumin .  Discussed at length with patient's sister about need for close follow-up with multiple subspecialists.  In the meantime, will order PT/OT eval now that patient is more lucid.  Ideally encephalopathy will resolve and discussion about discharge hopefully Wednesday (dialysis Tuesday).   Subjective: Patient evaluated while in hemodialysis.  Apparently with believes he is having a lupus flare as he describes worsening diffuse joint pain.  Appears more lucid and talkative but still somewhat lethargic, aloof.  Denies fever, shortness of breath, chest pain, nausea, vomiting.  P.o. intake poor.  Discussed with the patient's sister in his room about his plan going forward, possible need for low-dose pain medications, workup for lupus flare.  Physical Exam:  Vitals:   12/20/23 2143 12/21/23 0831 12/21/23 1300 12/21/23 1311  BP: 112/68 112/74 107/66 104/69  Pulse: 81 75 78 76  Resp:  19 16 16   Temp:  97.6 F (36.4 C) 98.6 F (37 C)   TempSrc:  Oral Oral   SpO2:  97% 99% 100%  Weight:  63.7 kg   Height:        GENERAL:  Alert, disheveled HEENT:  EOMI CARDIOVASCULAR:  RRR, systolic murmur appreciated RESPIRATORY:  Clear to auscultation, no wheezing, rales, or rhonchi GASTROINTESTINAL: Slightly distended, minimally tender EXTREMITIES: Thin, no  LE edema bilaterally NEURO:  No new focal deficits appreciated SKIN:  No rashes noted PSYCH: Appropriate, anxious   Data Reviewed:  Imaging Studies: IR Paracentesis Result Date: 12/21/2023 INDICATION: 53 year old male with a history of lupus, end-stage renal disease, and recurrent ascites. Recent diagnosis of SBP. Request for therapeutic paracentesis. EXAM: ULTRASOUND GUIDED right PARACENTESIS MEDICATIONS: 1% lidocaine , 9 mL. COMPLICATIONS: None immediate. PROCEDURE: Informed written consent was obtained from the patient after a discussion of the risks, benefits and alternatives to treatment. A timeout was performed prior to the initiation of the procedure. Initial ultrasound scanning demonstrates a moderate amount of loculated ascites within the right lower abdominal quadrant. The right lower abdomen was prepped and draped in the usual sterile fashion. 1% lidocaine  was used for local anesthesia. Following this, a 19 gauge, 7-cm, Yueh catheter was introduced. An ultrasound image was saved for documentation purposes. The paracentesis was performed. The catheter was removed and a dressing was applied. The patient tolerated the procedure well without immediate post procedural complication. FINDINGS: A total of approximately 700 mL of cloudy yellow fluid was removed. IMPRESSION: Successful ultrasound-guided paracentesis yielding 700 mL of peritoneal fluid. Procedure performed by: Sherrilee Bal, PA-C under supervision of Dr. Vanice Electronically Signed   By: CHRISTELLA.  Shick M.D.   On: 12/21/2023 13:48   CT HEAD WO CONTRAST ( ) Result Date: 12/18/2023 CLINICAL DATA:  Head trauma, coagulopathy (Age 38-64y) EXAM: CT HEAD WITHOUT CONTRAST TECHNIQUE: Contiguous axial images were obtained from the base of the skull through the vertex without intravenous contrast. RADIATION DOSE REDUCTION: This exam was performed according to the departmental dose-optimization program which includes automated exposure control,  adjustment of the mA and/or kV according to patient size and/or use of iterative reconstruction technique. COMPARISON:  None Available. FINDINGS: Mildly motion limited study. Brain: No evidence of acute infarction, hemorrhage, hydrocephalus, extra-axial collection or mass lesion/mass effect. Vascular: No hyperdense vessel. Skull: No acute fracture. Sinuses/Orbits: Clear sinuses.  No acute orbital findings. Other: No mastoid effusions. IMPRESSION: No evidence of acute intracranial abnormality. Electronically Signed   By: Gilmore GORMAN Molt M.D.   On: 12/18/2023 00:43   IR Paracentesis Result Date: 12/15/2023 INDICATION: 53 year old male. History of lupus, end-stage renal disease with recurrent ascites. Request for therapeutic paracentesis EXAM: ULTRASOUND GUIDED LEFT-SIDED THERAPEUTIC PARACENTESIS MEDICATIONS: Lidocaine  1% 10 mL COMPLICATIONS: None immediate. PROCEDURE: Informed written consent was obtained from the patient after a discussion of the risks, benefits and alternatives to treatment. A timeout was performed prior to the initiation of the procedure. Initial ultrasound scanning demonstrates a small amount of ascites within the right lower abdominal quadrant. The right lower abdomen was prepped and draped in the usual sterile fashion. 1% lidocaine  was used for local anesthesia. Following this, a 6 Fr Safe-T-Centesis catheter was introduced. An ultrasound image was saved for documentation purposes. The paracentesis was performed. The catheter was removed and a dressing was applied. The patient tolerated the procedure well without immediate post procedural complication. FINDINGS: A total of approximately 1.8 L of straw-colored fluid was removed. IMPRESSION: Successful ultrasound-guided therapeutic left-sided paracentesis yielding 1.8 liters of peritoneal fluid. Performed by Delon Beagle NP Electronically Signed   By: CHRISTELLA.  Shick M.D.   On: 12/15/2023 10:41   ECHOCARDIOGRAM COMPLETE Result Date:  12/15/2023    ECHOCARDIOGRAM REPORT   Patient Name:   [name] Date of Exam: 12/15/2023 Medical Rec #:  [id number]       Height:       71.0 in Accession #:    [id number]      Weight:       168.4 lb Date of Birth:  Dec 01, 1970       BSA:          1.960 m Patient Age:    53 years        BP:           107/74 mmHg Patient Gender: M               HR:           90 bpm. Exam Location:  Inpatient Procedure: 2D Echo, Cardiac Doppler and Color Doppler (Both Spectral and Color            Flow Doppler were utilized during procedure). Indications:    CHF Acute Systolic I50.21  History:        Patient has prior history of Echocardiogram examinations, most                 recent 04/28/2020. CHF.  Sonographer:    Tinnie Gosling RDCS Referring Phys: [phone] ERIC CHEN IMPRESSIONS  1. Left ventricular ejection fraction, by estimation, is 55 to 60%. The left ventricle has normal function. The left ventricle has no regional wall motion abnormalities. There is severe concentric left ventricular hypertrophy. Left ventricular diastolic  parameters are consistent with Grade I diastolic dysfunction (impaired relaxation).  2. Right ventricular systolic function is normal. The right ventricular size is normal.  3. Left atrial size was moderately dilated.  4. Right atrial size was mildly dilated.  5. The mitral valve is normal in structure. Trivial mitral valve regurgitation. No evidence of mitral stenosis.  6. The aortic valve is tricuspid. There is mild calcification of the aortic valve. Aortic valve regurgitation is not visualized. Aortic valve sclerosis/calcification is present, without any evidence of aortic stenosis.  7. Aortic dilatation noted. There is mild dilatation of the aortic root, measuring 40 mm.  8. The inferior vena cava is dilated in size with >50% respiratory variability, suggesting right atrial pressure of 8 mmHg. FINDINGS  Left Ventricle: Left ventricular ejection fraction, by estimation, is 55 to 60%. The left ventricle has  normal function. The left ventricle has no regional wall motion abnormalities. The left ventricular internal cavity size was normal in size. There is  severe concentric left ventricular hypertrophy. Left ventricular diastolic parameters are consistent with Grade I diastolic dysfunction (impaired relaxation). Right Ventricle: The right ventricular size is normal. No increase in right ventricular wall thickness. Right ventricular systolic function is normal. Left Atrium: Left atrial size was moderately dilated. Right Atrium: Right atrial size was mildly dilated. Pericardium: There is no evidence of pericardial effusion. Mitral Valve: The mitral valve is normal in structure. Trivial mitral valve regurgitation. No evidence of mitral valve stenosis. MV peak gradient, 4.2 mmHg. The mean mitral valve gradient is 3.0 mmHg. Tricuspid Valve: The tricuspid valve is normal in structure. Tricuspid valve regurgitation is mild . No evidence of tricuspid stenosis. Aortic Valve: The aortic valve is tricuspid. There is mild calcification of the aortic valve. Aortic valve regurgitation is not visualized. Aortic valve sclerosis/calcification is present, without any evidence of aortic stenosis. Pulmonic Valve: The pulmonic valve was normal in structure. Pulmonic valve regurgitation is trivial. No  evidence of pulmonic stenosis. Aorta: Aortic dilatation noted. There is mild dilatation of the aortic root, measuring 40 mm. Venous: The inferior vena cava is dilated in size with greater than 50% respiratory variability, suggesting right atrial pressure of 8 mmHg. IAS/Shunts: No atrial level shunt detected by color flow Doppler.  LEFT VENTRICLE PLAX 2D LVIDd:         4.60 cm   Diastology LVIDs:         3.20 cm   LV e' lateral:   8.16 cm/s LV PW:         2.10 cm   LV E/e' lateral: 13.1 LV IVS:        2.00 cm LVOT diam:     2.10 cm LV SV:         79 LV SV Index:   40 LVOT Area:     3.46 cm  RIGHT VENTRICLE             IVC RV S prime:     14.30  cm/s  IVC diam: 2.30 cm TAPSE (M-mode): 2.1 cm LEFT ATRIUM             Index        RIGHT ATRIUM           Index LA diam:        3.70 cm 1.89 cm/m   RA Area:     18.20 cm LA Vol (A2C):   76.1 ml 38.82 ml/m  RA Volume:   44.90 ml  22.91 ml/m LA Vol (A4C):   91.2 ml 46.53 ml/m LA Biplane Vol: 93.0 ml 47.45 ml/m  AORTIC VALVE LVOT Vmax:   127.00 cm/s LVOT Vmean:  80.100 cm/s LVOT VTI:    0.229 m  AORTA Ao Root diam: 4.00 cm Ao Asc diam:  3.70 cm MITRAL VALVE MV Area (PHT): 4.65 cm     SHUNTS MV Area VTI:   3.34 cm     Systemic VTI:  0.23 m MV Peak grad:  4.2 mmHg     Systemic Diam: 2.10 cm MV Mean grad:  3.0 mmHg MV Vmax:       1.03 m/s MV Vmean:      83.0 cm/s MV Decel Time: 163 msec MV E velocity: 107.00 cm/s MV A velocity: 117.00 cm/s MV E/A ratio:  0.91 Toribio Fuel MD Electronically signed by Toribio Fuel MD Signature Date/Time: 12/15/2023/9:40:29 AM    Final    CT ABDOMEN PELVIS W CONTRAST Result Date: 12/14/2023 CLINICAL DATA:  Right lower quadrant abdominal pain for 2 days. Nausea and vomiting. Hemodialysis patient. EXAM: CT ABDOMEN AND PELVIS WITH CONTRAST TECHNIQUE: Multidetector CT imaging of the abdomen and pelvis was performed using the standard protocol following bolus administration of intravenous contrast. RADIATION DOSE REDUCTION: This exam was performed according to the departmental dose-optimization program which includes automated exposure control, adjustment of the mA and/or kV according to patient size and/or use of iterative reconstruction technique. CONTRAST:  75mL OMNIPAQUE  IOHEXOL  350 MG/ML SOLN COMPARISON:  Abdominopelvic CT 08/04/2021 and 06/29/2021. FINDINGS: Lower chest: Interval improved aeration of the left lung base with mild residual atelectasis or scarring. No confluent airspace disease or significant pleural effusion. Moderate cardiomegaly without significant pericardial fluid. There is coronary artery atherosclerosis with calcifications of the mitral annulus.  Hepatobiliary: The liver is normal in density without suspicious focal abnormality. No evidence of gallstones, gallbladder wall thickening or biliary dilatation. Pancreas: Unremarkable. No pancreatic ductal dilatation or surrounding inflammatory changes. Spleen: Normal in size  without focal abnormality. Adrenals/Urinary Tract: Both adrenal glands appear normal. Moderate to severe renal cortical thinning and atrophy bilaterally, progressive from previous examination. No significant contrast excretion is demonstrated on the delayed images. There is no hydronephrosis or urinary tract calculus. The bladder appears unremarkable for its degree of distention. Stomach/Bowel: No enteric contrast administered. The stomach appears unremarkable for its degree of distention. Mild small bowel wall thickening with mucosal hyperenhancement in the mid abdomen. The appendix is not clearly seen on the current study, although there is no pericecal inflammation to suggest appendicitis. Mild diffuse colonic wall thickening. There are diverticular changes throughout the descending and sigmoid colon without focal surrounding inflammation. No evidence of bowel obstruction or perforation. Vascular/Lymphatic: Scattered prominent retroperitoneal lymph nodes, similar to previous study and likely reactive. Aortic and branch vessel atherosclerosis without evidence of aneurysm or large vessel occlusion. The portal, superior mesenteric and splenic veins are patent. Reproductive: The prostate gland and seminal vesicles appear unremarkable. Other: Moderate to large volume ascites again noted, similar in overall volume to the previous CT. There is apparent new diffuse peritoneal thickening without apparent nodularity. A large amount of fluid extends into the fall canal and screw, incompletely visualized but increased in volume from the previous study. No herniated bowel, organized fluid collection or pneumoperitoneum. Musculoskeletal: Chronic femoral  head osteonecrosis bilaterally without subchondral collapse or significant secondary degenerative changes. Mild lumbar spondylosis. No acute osseous findings are identified. Generalized subcutaneous edema appears similar to previous CT. IMPRESSION: 1. Moderate to large volume ascites, similar in overall volume to the previous CT. There is new diffuse smooth peritoneal thickening without apparent nodularity, suspicious for peritonitis. Consider diagnostic paracentesis. 2. Mild small bowel and colonic wall thickening, nonspecific in the setting of ascites. No evidence of bowel obstruction or perforation. The appendix is not discretely visualized, although there are no specific signs of appendicitis. 3. Progressive renal cortical thinning and atrophy bilaterally. No hydronephrosis or urinary tract calculus. 4. Chronic femoral head osteonecrosis bilaterally without subchondral collapse or significant secondary degenerative changes. 5.  Aortic Atherosclerosis (ICD10-I70.0). Electronically Signed   By: Elsie Perone M.D.   On: 12/14/2023 14:56   DG Chest 2 View Result Date: 12/14/2023 CLINICAL DATA:  SHOB EXAM: CHEST - 2 VIEW COMPARISON:  June 29, 2021 FINDINGS: Lower lung volumes . Bilateral perihilar interstitial opacities. No pneumothorax. Trace left pleural effusion. Mild cardiomegaly. Tortuous aorta with aortic atherosclerosis. No acute fracture or destructive lesions. Multilevel thoracic osteophytosis. IMPRESSION: Bilateral perihilar interstitial opacities, which may represent bronchovascular crowding due to low lung volumes, interstitial edema, or atypical/viral infection, in the correct clinical context. Electronically Signed   By: Rogelia Myers M.D.   On: 12/14/2023 13:09    There are no new results to review at this time.  Previous records (including but not limited to H&P, progress notes, nursing notes, TOC management) were reviewed in assessment of this patient.  Labs: CBC: Recent Labs  Lab  12/15/23 0412 12/16/23 0403 12/17/23 0502 12/19/23 0834 12/20/23 0542 12/21/23 0509  WBC 3.9* 5.0 5.3 3.0* 2.9* 5.3  NEUTROABS 3.4  --   --   --   --   --   HGB 11.1* 10.2* 10.2* 10.3* 9.9* 9.9*  HCT 34.8* 32.3* 32.1* 32.8* 30.9* 31.0*  MCV 88.5 90.2 89.2 89.1 87.8 86.8  PLT 64* 73* 86* 97* 91* 107*   Basic Metabolic Panel: Recent Labs  Lab 12/16/23 0403 12/17/23 0502 12/19/23 0834 12/20/23 0542 12/21/23 0509  NA 130* 131* 133* 129* 128*  K  5.6* 4.3 4.1 3.9 4.4  CL 90* 94* 95* 96* 90*  CO2 24 24 25 26 25   GLUCOSE 112* 77 86 137* 86  BUN 46* 28* 33* 45* 54*  CREATININE 7.12* 4.97* 5.32* 6.28* 7.19*  CALCIUM  7.3* 7.6* 7.9* 7.7* 8.0*  MG 1.7 1.7 1.9 2.0 2.2  PHOS 7.4* 4.7* 4.3 4.0  --    Liver Function Tests: Recent Labs  Lab 12/15/23 0412 12/16/23 0403 12/19/23 0834 12/20/23 0542  AST 43* 41 28 31  ALT 20 23 16 15   ALKPHOS 47 41 45 49  BILITOT 0.5 0.7 1.1 0.5  PROT 6.5 6.8 6.6 6.0*  ALBUMIN  1.7* 1.6* <1.5* <1.5*   CBG: Recent Labs  Lab 12/17/23 2014  GLUCAP 79    Scheduled Meds:  carvedilol   25 mg Oral BID   Chlorhexidine  Gluconate Cloth  6 each Topical Q0600   folic acid   1 mg Oral Daily   levothyroxine   50 mcg Oral QAC breakfast   lidocaine -EPINEPHrine   10 mL Intradermal Once   lidocaine -EPINEPHrine   30 mL Infiltration Once   multivitamin with minerals  1 tablet Oral Daily   polyethylene glycol  17 g Oral Daily   thiamine   100 mg Oral Daily   Or   thiamine   100 mg Intravenous Daily   Continuous Infusions:  [START ON 12/22/2023]  ceFAZolin  (ANCEF ) IV     PRN Meds:.acetaminophen  **OR** [DISCONTINUED] acetaminophen , albuterol , bisacodyl , bisacodyl , HYDROcodone -acetaminophen , melatonin, ondansetron  **OR** ondansetron  (ZOFRAN ) IV  Family Communication: Sister at bedside  Disposition: Status is: Inpatient Remains inpatient appropriate because: ESRD, SBP     Time spent: 40 minutes  Length of inpatient stay: 7 days  Author: Carliss LELON Canales,  DO 12/21/2023 2:01 PM  For on call review [email or web address].

## 2023-12-21 NOTE — Progress Notes (Signed)
  Received patient in bed to unit.   Informed consent signed and in chart.    TX duration: 3.5     Transported by  Hand-off given to patient's nurse.  Unable to remove fluid due to hypotension    Sitter at bedside  Access used: left AVF  Access issues: NOne   Total UF removed: 0.4 Medication(s) given: none Post HD VS: 112/77 Post HD weight:   64 kg   Hunter Hacking LPN [facility]

## 2023-12-22 DIAGNOSIS — K652 Spontaneous bacterial peritonitis: Secondary | ICD-10-CM | POA: Diagnosis not present

## 2023-12-22 LAB — MAGNESIUM: Magnesium: 1.9 mg/dL (ref 1.7–2.4)

## 2023-12-22 LAB — CBC
HCT: 33.4 % — ABNORMAL LOW (ref 39.0–52.0)
Hemoglobin: 10.6 g/dL — ABNORMAL LOW (ref 13.0–17.0)
MCH: 27.8 pg (ref 26.0–34.0)
MCHC: 31.7 g/dL (ref 30.0–36.0)
MCV: 87.7 fL (ref 80.0–100.0)
Platelets: 86 K/uL — ABNORMAL LOW (ref 150–400)
RBC: 3.81 MIL/uL — ABNORMAL LOW (ref 4.22–5.81)
RDW: 17.9 % — ABNORMAL HIGH (ref 11.5–15.5)
WBC: 8 K/uL (ref 4.0–10.5)
nRBC: 0 % (ref 0.0–0.2)

## 2023-12-22 LAB — BASIC METABOLIC PANEL WITH GFR
Anion gap: 10 (ref 5–15)
BUN: 34 mg/dL — ABNORMAL HIGH (ref 6–20)
CO2: 26 mmol/L (ref 22–32)
Calcium: 7.6 mg/dL — ABNORMAL LOW (ref 8.9–10.3)
Chloride: 94 mmol/L — ABNORMAL LOW (ref 98–111)
Creatinine, Ser: 5.03 mg/dL — ABNORMAL HIGH (ref 0.61–1.24)
GFR, Estimated: 13 mL/min — ABNORMAL LOW (ref >60)
Glucose, Bld: 129 mg/dL — ABNORMAL HIGH (ref 70–99)
Potassium: 4.2 mmol/L (ref 3.5–5.1)
Sodium: 130 mmol/L — ABNORMAL LOW (ref 135–145)

## 2023-12-22 LAB — GLUCOSE, CAPILLARY: Glucose-Capillary: 113 mg/dL — ABNORMAL HIGH (ref 70–99)

## 2023-12-22 LAB — ANTI-DNA ANTIBODY, DOUBLE-STRANDED: ds DNA Ab: 212 [IU]/mL — ABNORMAL HIGH (ref 0–9)

## 2023-12-22 LAB — PHOSPHORUS: Phosphorus: 4.6 mg/dL (ref 2.5–4.6)

## 2023-12-22 MED ORDER — HYDROXYCHLOROQUINE SULFATE 200 MG PO TABS
200.0000 mg | ORAL_TABLET | Freq: Two times a day (BID) | ORAL | Status: DC
  Administered 2023-12-22 – 2024-01-01 (×17): 200 mg via ORAL
  Filled 2023-12-22 (×21): qty 1

## 2023-12-22 MED ORDER — PREDNISONE 20 MG PO TABS
20.0000 mg | ORAL_TABLET | Freq: Every day | ORAL | Status: DC
  Administered 2023-12-23 – 2023-12-24 (×2): 20 mg via ORAL
  Filled 2023-12-22 (×2): qty 1

## 2023-12-22 MED ORDER — ACETAMINOPHEN 500 MG PO TABS
500.0000 mg | ORAL_TABLET | Freq: Four times a day (QID) | ORAL | Status: DC | PRN
Start: 2023-12-22 — End: 2024-01-03
  Administered 2023-12-23 – 2023-12-24 (×2): 500 mg via ORAL
  Filled 2023-12-22 (×4): qty 1

## 2023-12-22 MED ORDER — OXYCODONE HCL 5 MG PO TABS
5.0000 mg | ORAL_TABLET | ORAL | Status: DC | PRN
  Administered 2023-12-22 – 2024-01-03 (×15): 5 mg via ORAL
  Filled 2023-12-22 (×16): qty 1

## 2023-12-22 NOTE — Progress Notes (Signed)
 Occupational Therapy Treatment Patient Details Name: [name] MRN: [id number] DOB: March 23, 1971 Today's Date: 12/22/2023   History of present illness Pt is a 53 y/o M admitted on 12/14/23 after presenting with c/o abdominal pain. Pt is being treated for spontaneous bacterial peritonitis, acute metabolic encephalopathy. PMH: ESRD on HD TTS, lupus, ascites, systolic CHF   OT comments  Patient received in supine with complaints of scrotum and abdomin pain but willing to participate with OT. Patient required mod assist and increased time to get to EOB to avoid increased pain. Once on EOB patient declined self care tasks, stating he couldn't do anything on EOB for fear of increased pain. Patient declined OOB to recliner or standing. Patient performed lateral scooting towards HOB with mod assist before returning to supine with increased time and mod assist.  Patient positioned in bed to address pain with scrotum elevated with towel.  Patient will benefit from continued inpatient follow up therapy, <3 hours/day. Acute OT to continue to follow to address established goals to facilitate DC to next venue of care.        If plan is discharge home, recommend the following:  Two people to help with walking and/or transfers;A lot of help with bathing/dressing/bathroom;Assistance with cooking/housework;Assistance with feeding;Help with stairs or ramp for entrance;Assist for transportation;Direct supervision/assist for medications management;Supervision due to cognitive status   Equipment Recommendations  Other (comment) (defer)    Recommendations for Other Services      Precautions / Restrictions Precautions Precautions: Fall Recall of Precautions/Restrictions: Impaired Restrictions Weight Bearing Restrictions Per Provider Order: No       Mobility Bed Mobility Overal bed mobility: Needs Assistance Bed Mobility: Supine to Sit, Sit to Supine, Rolling     Supine to sit: Mod assist, HOB elevated,  Used rails Sit to supine: Mod assist, HOB elevated, Used rails   General bed mobility comments: patient able to use rail to raise trunk and required assistance for BLEs to get to EOB and back to supine    Transfers Overall transfer level: Needs assistance Equipment used: None              Lateral/Scoot Transfers: Mod assist General transfer comment: patient declined standing and performed lateral scooting towards HOB with mod assist     Balance Overall balance assessment: Needs assistance Sitting-balance support: Feet supported, Bilateral upper extremity supported Sitting balance-Leahy Scale: Poor Sitting balance - Comments: CGA to supervision for sitting balance.       Standing balance comment: patient declined attempting to stand                           ADL either performed or assessed with clinical judgement   ADL Overall ADL's : Needs assistance/impaired                                       General ADL Comments: patient declined self care tasks once on EOB.    Extremity/Trunk Assessment              Occupational psychologist Communication: Impaired Factors Affecting Communication: Reduced clarity of speech   Cognition Arousal: Alert Behavior During Therapy: Flat affect Cognition: Cognition impaired     Awareness: Intellectual awareness intact, Online awareness impaired Memory impairment (select all impairments): Short-term  memory Attention impairment (select first level of impairment): Selective attention Executive functioning impairment (select all impairments): Initiation, Sequencing, Reasoning OT - Cognition Comments: difficulty following commands, focused on pain                 Following commands: Impaired Following commands impaired: Follows one step commands inconsistently, Follows one step commands with increased time      Cueing   Cueing Techniques: Verbal  cues, Gestural cues, Tactile cues  Exercises      Shoulder Instructions       General Comments assisted patient with positioning in bed to address pain with towel used to elevate scrotum    Pertinent Vitals/ Pain       Pain Assessment Pain Assessment: Faces Faces Pain Scale: Hurts whole lot Pain Location: stomach, scrotum Pain Descriptors / Indicators: Grimacing, Guarding, Discomfort, Sharp, Moaning Pain Intervention(s): Limited activity within patient's tolerance, Monitored during session, Premedicated before session, Repositioned  Home Living                                          Prior Functioning/Environment              Frequency  Min 2X/week        Progress Toward Goals  OT Goals(current goals can now be found in the care plan section)  Progress towards OT goals: Progressing toward goals (slow progress due to pain)  Acute Rehab OT Goals Patient Stated Goal: less pain OT Goal Formulation: With patient Time For Goal Achievement: 01/03/24 Potential to Achieve Goals: Good ADL Goals Pt Will Perform Upper Body Dressing: with set-up;with supervision Pt Will Perform Lower Body Dressing: with min assist Pt Will Transfer to Toilet: with contact guard assist Pt Will Perform Toileting - Clothing Manipulation and hygiene: with set-up;with contact guard assist Additional ADL Goal #1: Pt will be able to follow commands >75% consistently to maximize safety and functional independence with ADLs/transfers  Plan      Co-evaluation                 AM-PAC OT 6 Clicks Daily Activity     Outcome Measure   Help from another person eating meals?: A Lot Help from another person taking care of personal grooming?: A Lot Help from another person toileting, which includes using toliet, bedpan, or urinal?: A Lot Help from another person bathing (including washing, rinsing, drying)?: A Lot Help from another person to put on and taking off regular upper  body clothing?: A Lot Help from another person to put on and taking off regular lower body clothing?: A Lot 6 Click Score: 12    End of Session    OT Visit Diagnosis: Unsteadiness on feet (R26.81);Other abnormalities of gait and mobility (R26.89);Repeated falls (R29.6);Muscle weakness (generalized) (M62.81);Pain;Other symptoms and signs involving cognitive function Pain - part of body:  (scrotum and abdomen)   Activity Tolerance Patient limited by pain   Patient Left in bed;with call bell/phone within reach;with bed alarm set;with family/visitor present   Nurse Communication Mobility status        Time: 9171-9144 OT Time Calculation (min): 27 min  Charges: OT General Charges $OT Visit: 1 Visit OT Treatments $Therapeutic Activity: 23-37 mins  [name], OTA Acute Rehabilitation Services  Office [phone]   [name] 12/22/2023, 12:02 PM

## 2023-12-22 NOTE — Plan of Care (Signed)
   Problem: Elimination: Goal: Will not experience complications related to bowel motility Outcome: Progressing   Problem: Pain Managment: Goal: General experience of comfort will improve and/or be controlled Outcome: Progressing

## 2023-12-22 NOTE — Progress Notes (Signed)
  [facility] Progress Note   Subjective: Seen in room. Family at bedside. Minimal UF with dialysis. S/p paracentesis with 700 ml removed. Endorses pain in lower abdomen.   Objective Vitals:   12/21/23 1630 12/21/23 1645 12/21/23 2212 12/22/23 0354  BP: 99/61 112/77 109/70 116/78  Pulse: 76 79 81 86  Resp: 13 17 15    Temp:  97.8 F (36.6 C) 97.8 F (36.6 C) 98 F (36.7 C)  TempSrc:  Axillary Oral Oral  SpO2: 100% 97% 94% 98%  Weight:      Height:       Physical Exam Gen: Lying in bed, nad  Pulm: Normal wob CV: RRR Abd: soft, tender to the touch Ext: 1+ LE edema Access: LUE AVF +bruit.   Additional Objective Labs: Basic Metabolic Panel: Recent Labs  Lab 12/19/23 0834 12/20/23 0542 12/21/23 0509 12/22/23 0441  NA 133* 129* 128* 130*  K 4.1 3.9 4.4 4.2  CL 95* 96* 90* 94*  CO2 25 26 25 26   GLUCOSE 86 137* 86 129*  BUN 33* 45* 54* 34*  CREATININE 5.32* 6.28* 7.19* 5.03*  CALCIUM  7.9* 7.7* 8.0* 7.6*  PHOS 4.3 4.0  --  4.6   Liver Function Tests: Recent Labs  Lab 12/16/23 0403 12/19/23 0834 12/20/23 0542  AST 41 28 31  ALT 23 16 15   ALKPHOS 41 45 49  BILITOT 0.7 1.1 0.5  PROT 6.8 6.6 6.0*  ALBUMIN  1.6* <1.5* <1.5*   No results for input(s): LIPASE, AMYLASE in the last 168 hours.  CBC: Recent Labs  Lab 12/17/23 0502 12/19/23 0834 12/20/23 0542 12/21/23 0509 12/22/23 0441  WBC 5.3 3.0* 2.9* 5.3 8.0  HGB 10.2* 10.3* 9.9* 9.9* 10.6*  HCT 32.1* 32.8* 30.9* 31.0* 33.4*  MCV 89.2 89.1 87.8 86.8 87.7  PLT 86* 97* 91* 107* 86*   Medications:   ceFAZolin  (ANCEF ) IV      carvedilol   25 mg Oral BID   Chlorhexidine  Gluconate Cloth  6 each Topical Q0600   folic acid   1 mg Oral Daily   levothyroxine   50 mcg Oral QAC breakfast   lidocaine -EPINEPHrine   10 mL Intradermal Once   lidocaine -EPINEPHrine   30 mL Infiltration Once   multivitamin with minerals  1 tablet Oral Daily   polyethylene glycol  17 g Oral Daily   thiamine   100 mg Oral  Daily   Or   thiamine   100 mg Intravenous Daily   Home bp meds: Coreg  25 bid Hydralazine 25 bid (not taking) Irbesartan  300 mg every day      OP HD: SW TTS 3h  B400  71kg   2K bath  AVF   Heparin  none     Assessment/ Plan: Peritonitis.  CT showed findings suspicious for SBP. Fluid cx + Klebsiella. On cefazolin . Per primary  Acute encephalopathy. c/w alcohol withdrawal. On CIWA protocol  ESRD: HD TTS. Next HD 8/28.  HTN. BP acceptable Continue home meds.  Volume: Does have scrotal edema; Challenge EDW as able.  Anemia. Hgb 10.  no esa needs. Follow.  Lupus: followed by rheumatology. Family concerned about flare. Spoke with someone about checking lupus serologies here.  BMD: Ca/phos acceptable. Follow trends.  Recurrent ascites: has been getting paracenteses since 2022. Had paracentesis 8/20, 8/26.   Maisie Ronnald Acosta PA-C [facility] 12/22/2023,10:01 AM

## 2023-12-22 NOTE — Progress Notes (Signed)
 Physical Therapy Treatment Patient Details Name: [name] MRN: [id number] DOB: [birth year] Today's Date: 12/22/2023   History of Present Illness Pt is a 53 y/o M admitted on 12/14/23 after presenting with c/o abdominal pain. Pt is being treated for spontaneous bacterial peritonitis, acute metabolic encephalopathy. PMH: ESRD on HD TTS, lupus, ascites, systolic CHF    PT Comments  Pt limited today by abdominal pain, declining attempts at EOB or OOB mobility, but agreeable to bed level lower extremity exercises. Pt easily distracted and needed repeated cues to remain on task and extra time to perform each rep of the exercises mentioned below in Exercises section. Educated pt, sister, and cousin that will need to start progressing mobility soon to prevent further deconditioning. Encouraged pt to perform exercises to tolerance in [employer] HEP handout with Access Code: [email or web address]. Educated them on rolling schedule to prevent pressure ulcers. They verbalized understanding. Will continue to follow acutely.     If plan is discharge home, recommend the following: Two people to help with bathing/dressing/bathroom;Two people to help with walking and/or transfers;Direct supervision/assist for medications management;Help with stairs or ramp for entrance;Assist for transportation;Assistance with feeding;Assistance with cooking/housework;Direct supervision/assist for financial management;Supervision due to cognitive status   Can travel by private vehicle     No  Equipment Recommendations  Other (comment) (defer to next venue)    Recommendations for Other Services       Precautions / Restrictions Precautions Precautions: Fall Recall of Precautions/Restrictions: Impaired Restrictions Weight Bearing Restrictions Per Provider Order: No     Mobility  Bed Mobility Overal bed mobility: Needs Assistance Bed Mobility: Rolling Rolling: Min assist, Used rails         General bed mobility comments:  Extra time before pt initiated rolling to R, but pt only needed minA to rotate hips. Pt declined EOB or OOB mobility due to abdominal pain this date.    Transfers                   General transfer comment: Pt declined EOB or OOB mobility due to abdominal pain this date.    Ambulation/Gait               General Gait Details: Pt declined EOB or OOB mobility due to abdominal pain this date.   Stairs             Wheelchair Mobility     Tilt Bed    Modified Rankin (Stroke Patients Only)       Balance Overall balance assessment: Needs assistance     Sitting balance - Comments: Pt declined EOB or OOB mobility due to abdominal pain this date.       Standing balance comment: Pt declined EOB or OOB mobility due to abdominal pain this date.                            Communication Communication Communication: Impaired Factors Affecting Communication: Reduced clarity of speech  Cognition Arousal: Alert Behavior During Therapy: Flat affect   PT - Cognitive impairments: Awareness, Attention, Initiation, Sequencing, Problem solving, Safety/Judgement, Memory                       PT - Cognition Comments: Pt trails off on thoughts and needs frequent cuing to remain alert and attentive to task at hand. Following commands: Impaired Following commands impaired: Follows one step commands inconsistently, Follows one step commands with increased time  Cueing Cueing Techniques: Verbal cues, Gestural cues, Tactile cues  Exercises General Exercises - Lower Extremity Ankle Circles/Pumps: AROM, Strengthening, Both, 10 reps, Supine Short Arc Quad: AROM, Strengthening, Both, 10 reps, Supine Hip ABduction/ADduction: AAROM, Strengthening, Both, 10 reps, Supine    General Comments General comments (skin integrity, edema, etc.): Educated pt, sister, and cousin that will need to start progressing mobility soon to prevent further deconditioning.  Encouraged pt to perform exercises to tolerance in Med[employer] HEP handout with Access Code: [email or web address]. Educated them on rolling schedule to prevent pressure ulcers. They verbalized understanding.      Pertinent Vitals/Pain Pain Assessment Pain Assessment: Faces Faces Pain Scale: Hurts whole lot Pain Location: abdomen and generalized pain Pain Descriptors / Indicators: Grimacing, Guarding, Discomfort, Sharp Pain Intervention(s): Limited activity within patient's tolerance, Monitored during session, Repositioned, Patient requesting pain meds-RN notified    Home Living                          Prior Function            PT Goals (current goals can now be found in the care plan section) Acute Rehab PT Goals Patient Stated Goal: to reduce pain and improve PT Goal Formulation: With patient/family Time For Goal Achievement: 01/03/24 Potential to Achieve Goals: Fair Progress towards PT goals: Not progressing toward goals - comment (limited by pain today)    Frequency    Min 3X/week      PT Plan      Co-evaluation              AM-PAC PT 6 Clicks Mobility   Outcome Measure  Help needed turning from your back to your side while in a flat bed without using bedrails?: A Little Help needed moving from lying on your back to sitting on the side of a flat bed without using bedrails?: A Lot Help needed moving to and from a bed to a chair (including a wheelchair)?: Total Help needed standing up from a chair using your arms (e.g., wheelchair or bedside chair)?: Total Help needed to walk in hospital room?: Total Help needed climbing 3-5 steps with a railing? : Total 6 Click Score: 9    End of Session   Activity Tolerance: Patient limited by pain Patient left: in bed;with call bell/phone within reach;with bed alarm set;with family/visitor present Nurse Communication: Patient requests pain meds;Mobility status;Need for lift equipment (stedy if OOB; sister asking about  shower in next few days) PT Visit Diagnosis: Unsteadiness on feet (R26.81);Other abnormalities of gait and mobility (R26.89);Difficulty in walking, not elsewhere classified (R26.2);Muscle weakness (generalized) (M62.81)     Time: 8546-8474 PT Time Calculation (min) (ACUTE ONLY): 32 min  Charges:    $Therapeutic Exercise: 8-22 mins $Therapeutic Activity: 8-22 mins PT General Charges $$ ACUTE PT VISIT: 1 Visit                     Theo Ferretti, PT, DPT Acute Rehabilitation Services  Office: [phone]    Theo CHRISTELLA Ferretti 12/22/2023, 4:26 PM

## 2023-12-22 NOTE — Progress Notes (Signed)
 Progress Note   Patient: [name] FMW:[id number] DOB: 03/02/1971 DOA: 12/14/2023     8 DOS: the patient was seen and examined on 12/22/2023   Brief hospital course: 53yo with h/o ESRD on TTS HD, SLE, ascites without diagnosis of cirrhosis, and chronic HFrEF who presented on 8/19 with abdominal pain.  Last paracentesis was in 09/2023.  Bedside paracentesis performed with high concern for SBP, cultures positive for Klebsiella, treated with Ceftriaxone  -> Cefazolin  x 14 days.  IR performed paracentesis on 8/20 with 1.8L removed and repeat on 8/26 with 700 cc removed; etiology is thought to be related to severely low albumin .  Developed severe acute metabolic encephalopathy from alcohol withdrawal which has since improved.  He is recommended for SNF rehab.  Assessment and Plan:  Spontaneous bacterial peritonitis Patient with known h/o ascites but no documented cirrhosis (see below) Initial peritoneal fluid collection noting greater than 3600 WBCs, 95% neutrophils Culture with Klebsiella Transition ceftriaxone  to cefazolin  for total of 14 days   SBP prophylaxis should be continued for those with h/o SBP with either Bactrim  DS or Ciprofloxacin  Repeat paracentesis on 8/26 was not sent for analysis - will see if it is too late to add cell count/diff, culture, cytology, protein, albumin   Acute metabolic encephalopathy Likely related to alcohol withdrawal Beginning 8/22 (3 days after presentation) patient appeared very anxious, delirious, paranoid, even hallucinating Discussion with sister - patient does drink to cover pain from his lupus, has reduced his alcohol consumption, reports drinking 1.5 pints of alcohol at a time periodically but denies daily drinking Currently on CIWA protocol Will be very sparing with any sedative type medications  Appears to be improving Will cancel safety monitoring    Large volume ascites IR paracentesis performed 8/20 with 1.8 L removed Patient having  worsening abdominal tightness and tenderness Ordered repeat therapeutic paracentesis 8/26, yielding only Sister reports prior liver biopsy ruling out cirrhosis about 2-3 years ago and performed here but I am not able to appreciate any pathology results from prior liver biopsy done in our system at this time Given his h/o SLE in addition to ETOH consumption, cirrhosis remains probable in the setting of recurrent ascites CT is not suggestive of cirrhosis, although there is moderate to large volume ascites Will request GI consult  Systemic lupus erythematosus with lung involvement Sister is concerned that his presentation is related to SLE Dr. Melia notes that lupus flares are less common in HD patients but still possible Will terat with Plaquenil  200 mg PO BID and prednisone  20 mg daily for now He has an ongoing need to follow-up closely as lupus appears to be underlying cause of many of his comorbidities both directly and indirectly  Abdominal pain Possibly related to SBP vs. R inguinal hernia with significant R scrotal edema vs. SLE flare vs. 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 intake Discussed at length with patient's sister about need for  close follow-up with multiple subspecialists PT/OT consulted and are recommending STR Patient/sister are in agreement with STR at this time Will need to start a bed search [facility] consulted       Consultants: Nephrology  IR PT OT TOC team  Procedures: Paracentesis, 8/19, 8/20, 8/26  Antibiotics: Ceftriaxone  8/19-26 Metronidazole  8/19-22 Cefazolin  8/27-9/7  30 Day Unplanned Readmission Risk Score    Flowsheet Row ED to Hosp-Admission (Current) from 12/14/2023 in [facility]  30 Day Unplanned Readmission Risk Score (%) 16.26 Filed at 12/22/2023 0801    This score is the patient's risk of an unplanned readmission within 30 days of being discharged (0 -100%). The score is based on dignosis, age, lab data, medications, orders, and past utilization.   Low:  0-14.9   Medium: 15-21.9   High: 22-29.9   Extreme: 30 and above           Subjective: Less confused, able to answer questions appropriately.  He reports significant lower abdominal pain.  Marked R-sided scrotal edema, unchanged.   Objective: Vitals:   12/22/23 0354 12/22/23 1035  BP: 116/78 102/70  Pulse: 86 82  Resp:  18  Temp: 98 F (36.7 C) 98.2 F (36.8 C)  SpO2: 98% 97%    Intake/Output Summary (Last 24 hours) at 12/22/2023 1705 Last data filed at 12/21/2023 2300 Gross per 24 hour  Intake 240 ml  Output --  Net 240 ml   Filed Weights   12/18/23 1025 12/18/23 1502 12/21/23 1300  Weight: 71.1 kg 68.1 kg 63.7 kg    Exam:  General:  Appears frail, cachectic, chronically ill Eyes:  normal lids, iris ENT:  grossly normal hearing, lips & tongue, mmm; suboptimal dentition Cardiovascular:  RRR, 4/6 systolic murmur with click. No LE edema.  Respiratory:   CTA bilaterally with no wheezes/rales/rhonchi.  Normal respiratory effort. Abdomen:  moderate ascites, not terribly TTP, marked R scrotal edema Skin:  no rash or induration seen on limited exam Musculoskeletal:  generally poor  strength, no bony abnormality Psychiatric:  blunted mood and affect, speech sparse but appropriate, AOx3 Neurologic:  CN 2-12 grossly intact, moves all extremities in coordinated fashion  Data Reviewed: I have reviewed the patient's lab results since admission.  Pertinent labs for today include:   Na++ 130, stable Glucose 129 BUN 34/Creatinine 5.04/GFR 13 WBC 8 Hgb 10.6, stable Platelets 86 TSH 7.890 on 8/22    Family Communication: Sister and cousin were present throughout evaluation  Disposition: Status is: Inpatient Remains inpatient appropriate because: ongoing management     Time spent: 50 minutes  Unresulted Labs (From admission, onward)     Start     Ordered   12/23/23 0500  CBC with Differential/Platelet  Tomorrow morning,   R       Question:  Specimen collection method  Answer:  Lab=Lab collect   12/22/23 1705   12/23/23 0500  Protime-INR  Tomorrow morning,   R       Question:  Specimen collection method  Answer:  Lab=Lab collect   12/22/23 1705   12/23/23 0500  Comprehensive metabolic panel with GFR  Tomorrow morning,   R       Question:  Specimen collection method  Answer:  Lab=Lab collect   12/22/23 1705   12/21/23 0926  C3 complement  Once,   R  Question:  Specimen collection method  Answer:  Lab=Lab collect   12/21/23 0925   12/21/23 0926  C4 complement  Once,   R       Question:  Specimen collection method  Answer:  Lab=Lab collect   12/21/23 9074             Author: Delon Herald, MD 12/22/2023 5:05 PM  For on call review [email or web address].

## 2023-12-22 NOTE — TOC Progression Note (Signed)
 Transition of Care [facility]) - Progression Note    Patient Details  Name: [name] MRN: [id number] Date of Birth: 12-20-1970  Transition of Care [facility]) CM/SW Contact  Lendia Dais, [place] Phone Number: 12/22/2023, 3:20 PM  Clinical Narrative:   CSW spoke to pt and pt's cousin at bedside.  CSW informed patient that the cost of HH would be 75-$150 per session and that OPPT would have copays associated with it as well. CSW stated that the cost would depend on how much their insurance will cover and that they could reach out to their insurance to inquire. Lean and the pt stated understanding.  CSW will continue to follow.     Expected Discharge Plan: Home/Self Care Barriers to Discharge: Continued Medical Work up               Expected Discharge Plan and Services In-house Referral: Clinical Social Work     Living arrangements for the past 2 months: Single Family Home                                       Social Drivers of Health (SDOH) Interventions SDOH Screenings   Food Insecurity: No Food Insecurity (12/15/2023)  Housing: Low Risk  (12/15/2023)  Transportation Needs: No Transportation Needs (12/15/2023)  Utilities: Not At Risk (12/15/2023)  Depression (PHQ2-9): Low Risk  (01/02/2022)  Tobacco Use: Low Risk  (12/14/2023)    Readmission Risk Interventions     No data to display

## 2023-12-23 DIAGNOSIS — D61818 Other pancytopenia: Secondary | ICD-10-CM | POA: Diagnosis not present

## 2023-12-23 DIAGNOSIS — N186 End stage renal disease: Secondary | ICD-10-CM | POA: Diagnosis not present

## 2023-12-23 DIAGNOSIS — R188 Other ascites: Secondary | ICD-10-CM | POA: Diagnosis not present

## 2023-12-23 DIAGNOSIS — K652 Spontaneous bacterial peritonitis: Secondary | ICD-10-CM | POA: Diagnosis not present

## 2023-12-23 LAB — COMPREHENSIVE METABOLIC PANEL WITH GFR
ALT: 11 U/L (ref 0–44)
AST: 29 U/L (ref 15–41)
Albumin: <1.5 g/dL — ABNORMAL LOW (ref 3.5–5.0)
Alkaline Phosphatase: 75 U/L (ref 38–126)
Anion gap: 10 (ref 5–15)
BUN: 46 mg/dL — ABNORMAL HIGH (ref 6–20)
CO2: 26 mmol/L (ref 22–32)
Calcium: 7.7 mg/dL — ABNORMAL LOW (ref 8.9–10.3)
Chloride: 94 mmol/L — ABNORMAL LOW (ref 98–111)
Creatinine, Ser: 6.56 mg/dL — ABNORMAL HIGH (ref 0.61–1.24)
GFR, Estimated: 9 mL/min — ABNORMAL LOW (ref >60)
Glucose, Bld: 81 mg/dL (ref 70–99)
Potassium: 4.9 mmol/L (ref 3.5–5.1)
Sodium: 130 mmol/L — ABNORMAL LOW (ref 135–145)
Total Bilirubin: 0.7 mg/dL (ref 0.0–1.2)
Total Protein: 6.3 g/dL — ABNORMAL LOW (ref 6.5–8.1)

## 2023-12-23 LAB — CBC WITH DIFFERENTIAL/PLATELET
Abs Immature Granulocytes: 0.7 K/uL — ABNORMAL HIGH (ref 0.00–0.07)
Basophils Absolute: 0.1 K/uL (ref 0.0–0.1)
Basophils Relative: 1 %
Eosinophils Absolute: 0.1 K/uL (ref 0.0–0.5)
Eosinophils Relative: 1 %
HCT: 33.9 % — ABNORMAL LOW (ref 39.0–52.0)
Hemoglobin: 10.9 g/dL — ABNORMAL LOW (ref 13.0–17.0)
Immature Granulocytes: 8 %
Lymphocytes Relative: 7 %
Lymphs Abs: 0.6 K/uL — ABNORMAL LOW (ref 0.7–4.0)
MCH: 27.9 pg (ref 26.0–34.0)
MCHC: 32.2 g/dL (ref 30.0–36.0)
MCV: 86.9 fL (ref 80.0–100.0)
Monocytes Absolute: 0.6 K/uL (ref 0.1–1.0)
Monocytes Relative: 7 %
Neutro Abs: 6.4 K/uL (ref 1.7–7.7)
Neutrophils Relative %: 76 %
Platelets: 92 K/uL — ABNORMAL LOW (ref 150–400)
RBC: 3.9 MIL/uL — ABNORMAL LOW (ref 4.22–5.81)
RDW: 18 % — ABNORMAL HIGH (ref 11.5–15.5)
WBC: 8.5 K/uL (ref 4.0–10.5)
nRBC: 0 % (ref 0.0–0.2)

## 2023-12-23 LAB — C4 COMPLEMENT: Complement C4, Body Fluid: 6 mg/dL — ABNORMAL LOW (ref 12–38)

## 2023-12-23 LAB — PROTIME-INR
INR: 1.2 (ref 0.8–1.2)
Prothrombin Time: 16.2 s — ABNORMAL HIGH (ref 11.4–15.2)

## 2023-12-23 LAB — C3 COMPLEMENT: C3 Complement: 60 mg/dL — ABNORMAL LOW (ref 82–167)

## 2023-12-23 MED ORDER — OXYCODONE HCL 5 MG PO TABS
ORAL_TABLET | ORAL | Status: AC
  Filled 2023-12-23: qty 1

## 2023-12-23 MED ORDER — HEPARIN SODIUM (PORCINE) 1000 UNIT/ML DIALYSIS: 1000.0000 [IU] | INTRAMUSCULAR | Status: DC | PRN

## 2023-12-23 MED ORDER — ANTICOAGULANT SODIUM CITRATE 4% (200MG/5ML) IV SOLN
5.0000 mL | Status: DC | PRN
Start: 2023-12-23 — End: 2023-12-23

## 2023-12-23 MED ORDER — LIDOCAINE-PRILOCAINE 2.5-2.5 % EX CREA: 1.0000 | TOPICAL_CREAM | CUTANEOUS | Status: DC | PRN

## 2023-12-23 MED ORDER — LIDOCAINE HCL (PF) 1 % IJ SOLN: 5.0000 mL | INTRAMUSCULAR | Status: DC | PRN

## 2023-12-23 MED ORDER — ALTEPLASE 2 MG IJ SOLR: 2.0000 mg | Freq: Once | INTRAMUSCULAR | Status: DC | PRN

## 2023-12-23 MED ORDER — ALBUMIN HUMAN 25 % IV SOLN
25.0000 g | Freq: Once | INTRAVENOUS | Status: AC
  Administered 2023-12-23: 25 g via INTRAVENOUS

## 2023-12-23 MED ORDER — PENTAFLUOROPROP-TETRAFLUOROETH EX AERO
1.0000 | INHALATION_SPRAY | CUTANEOUS | Status: DC | PRN
Start: 2023-12-23 — End: 2023-12-23

## 2023-12-23 NOTE — Progress Notes (Incomplete)
 Patie

## 2023-12-23 NOTE — Progress Notes (Addendum)
 Physical Therapy Treatment Patient Details Name: [name] MRN: [id number] DOB: 1970-10-08 Today's Date: 12/23/2023   History of Present Illness Pt is a 53 y/o M admitted on 12/14/23 after presenting with c/o abdominal pain. Pt is being treated for spontaneous bacterial peritonitis, acute metabolic encephalopathy. PMH: ESRD on HD TTS, lupus, ascites, systolic CHF    PT Comments  The pt was sleeping upon arrival, but once sitting up he was more alert, yet continued to display deficits in attention, memory, sequencing, awareness, and problem-solving. He was agreeable to progressing OOB mobility, stating I want to push myself. He was able to stand and ambulate up to ~4-5 ft bouts with RW support and modA. He is at high risk for falls though as his arms and legs spontaneously give and buckle, resulting in LOB bouts and a need for modA to recover when standing and minA at times when sitting. Encouraged continued AROM of his extremities to improve his strength and stability. Will continue to follow acutely. Recommend nursing utilized the stedy to transfer pt with +2 for safety due to his leg buckling and high risk for falls. Notified the NT.    If plan is discharge home, recommend the following: Two people to help with bathing/dressing/bathroom;Two people to help with walking and/or transfers;Direct supervision/assist for medications management;Help with stairs or ramp for entrance;Assist for transportation;Assistance with feeding;Assistance with cooking/housework;Direct supervision/assist for financial management;Supervision due to cognitive status   Can travel by private vehicle     No  Equipment Recommendations  Rolling walker (2 wheels);BSC/3in1;Wheelchair (measurements PT);Wheelchair cushion (measurements PT);Hospital bed;Hoyer lift (pending progress)    Recommendations for Other Services       Precautions / Restrictions Precautions Precautions: Fall Recall of Precautions/Restrictions:  Impaired Restrictions Weight Bearing Restrictions Per Provider Order: No     Mobility  Bed Mobility Overal bed mobility: Needs Assistance Bed Mobility: Supine to Sit     Supine to sit: Mod assist, HOB elevated, Used rails     General bed mobility comments: Extra time and step-by-step cues needed to bring each leg off R EOB and grab R bed rail with R UE to pull and sit up R EOB with modA at trunk and minA at L leg.    Transfers Overall transfer level: Needs assistance Equipment used: Rolling walker (2 wheels) Transfers: Sit to/from Stand, Bed to chair/wheelchair/BSC Sit to Stand: Mod assist   Step pivot transfers: Mod assist       General transfer comment: Cues needed for hand placement with transfers to stand from sitting, 1x from EOB and 1x from recliner. ModA needed to power up to stand and gain balance. Pt's arms and legs spontaneously intermittently buckling. ModA for balance to step pivot to R from bed to recliner.    Ambulation/Gait Ambulation/Gait assistance: Mod assist Gait Distance (Feet): 5 Feet (x2 bouts of ~4 ft > ~5 ft) Assistive device: Rolling walker (2 wheels) Gait Pattern/deviations: Step-to pattern, Decreased step length - right, Decreased step length - left, Decreased stride length, Knees buckling, Trunk flexed Gait velocity: reduced Gait velocity interpretation: <1.31 ft/sec, indicative of household ambulator   General Gait Details: Pt takes slow, small, unsteady steps with his arms and legs spontaneously intermittently buckling and him needed modA to recover along with cues to extend his knees.   Stairs             Wheelchair Mobility     Tilt Bed    Modified Rankin (Stroke Patients Only)  Balance Overall balance assessment: Needs assistance Sitting-balance support: Single extremity supported, Bilateral upper extremity supported, Feet supported Sitting balance-Leahy Scale: Poor Sitting balance - Comments: UE support with his  arms intermittently spontaneously giving way and him swaying anteriorly, CGA-minA for balance sitting EOB and in recliner with back unsupported Postural control: Other (comment) (anterior lean when sitting) Standing balance support: Bilateral upper extremity supported, During functional activity, Reliant on assistive device for balance Standing balance-Leahy Scale: Poor Standing balance comment: reliant on RW and modA with spontaneous knee buckling and LOB, modA to recover                            Communication Communication Communication: Impaired Factors Affecting Communication: Reduced clarity of speech  Cognition Arousal: Alert, Lethargic Behavior During Therapy: Flat affect   PT - Cognitive impairments: Awareness, Attention, Initiation, Sequencing, Problem solving, Safety/Judgement, Memory                       PT - Cognition Comments: Pt trails off on thoughts and needs frequent cuing to remain alert and attentive to task at hand. Pt initially sleeping but once sitting up, arousal improved. Following commands: Impaired Following commands impaired: Follows one step commands inconsistently, Follows one step commands with increased time    Cueing Cueing Techniques: Verbal cues, Gestural cues, Tactile cues  Exercises      General Comments General comments (skin integrity, edema, etc.): Encouraged seated LAQs and AROM to improve strength, pt verbalized understanding      Pertinent Vitals/Pain Pain Assessment Pain Assessment: Faces Faces Pain Scale: Hurts little more Pain Location: generalized, scrotum Pain Descriptors / Indicators: Grimacing, Guarding, Discomfort, Sore Pain Intervention(s): Limited activity within patient's tolerance, Monitored during session, Repositioned    Home Living                          Prior Function            PT Goals (current goals can now be found in the care plan section) Acute Rehab PT Goals Patient Stated  Goal: to reduce pain and improve PT Goal Formulation: With patient Time For Goal Achievement: 01/03/24 Potential to Achieve Goals: Fair Progress towards PT goals: Progressing toward goals    Frequency    Min 3X/week      PT Plan      Co-evaluation              AM-PAC PT 6 Clicks Mobility   Outcome Measure  Help needed turning from your back to your side while in a flat bed without using bedrails?: A Little Help needed moving from lying on your back to sitting on the side of a flat bed without using bedrails?: A Lot Help needed moving to and from a bed to a chair (including a wheelchair)?: A Lot Help needed standing up from a chair using your arms (e.g., wheelchair or bedside chair)?: A Lot Help needed to walk in hospital room?: Total Help needed climbing 3-5 steps with a railing? : Total 6 Click Score: 11    End of Session Equipment Utilized During Treatment: Gait belt Activity Tolerance: Patient tolerated treatment well Patient left: with call bell/phone within reach;in chair;with chair alarm set;Other (comment) (with scrotum elevated on towels) Nurse Communication: Mobility status;Need for lift equipment (stedy - NT) PT Visit Diagnosis: Unsteadiness on feet (R26.81);Other abnormalities of gait and mobility (R26.89);Difficulty in walking, not  elsewhere classified (R26.2);Muscle weakness (generalized) (M62.81)     Time: 8365-8292 PT Time Calculation (min) (ACUTE ONLY): 33 min  Charges:    $Gait Training: 8-22 mins $Therapeutic Activity: 8-22 mins PT General Charges $$ ACUTE PT VISIT: 1 Visit                     Theo Ferretti, PT, DPT Acute Rehabilitation Services  Office: [phone]    Theo CHRISTELLA Ferretti 12/23/2023, 5:22 PM

## 2023-12-23 NOTE — NC FL2 (Signed)
 [place]  MEDICAID FL2 LEVEL OF CARE FORM     IDENTIFICATION  Patient Name: [name] Birthdate: [birth year] Sex: male Admission Date (Current Location): 12/14/2023  [facility] and [place] Number:  Producer, television/film/video and Address:  [facility] [place], [address] [place], [place] [address]      Provider Number: [id number]  Attending Physician Name and Address:  Barbarann Nest, MD  Relative Name and Phone Number:       Current Level of Care: Hospital Recommended Level of Care: Skilled Nursing Facility Prior Approval Number:    Date Approved/Denied:   PASRR Number: [id number]  Discharge Plan: SNF    Current Diagnoses: Patient Active Problem List   Diagnosis Date Noted   Spontaneous bacterial peritonitis (HCC) 12/14/2023   Hyperkalemia 12/14/2023   Pericardial effusion 12/01/2021   Ascites 10/01/2020   Disorder of kidney and ureter, unspecified 10/01/2020   Essential hypertension 10/01/2020   Inflammatory arthritis 10/01/2020   Lupus nephritis (HCC) 10/01/2020   Thrombocytopenia (HCC) 10/01/2020   ESRD on dialysis (HCC) 07/03/2020   Weight loss 07/03/2020   Malnutrition of moderate degree 05/01/2020   Acquired hypothyroidism 11/27/2019   Cardiac murmur 11/24/2019   Chronic systolic CHF (congestive heart failure) (HCC) 11/06/2019   Pancytopenia (HCC) 02/06/2019   Alcohol use 02/06/2019   Elevated LFTs 02/06/2019   Vitamin D  deficiency 12/05/2018   False positive interferon-gamma release assay (IGRA) for tuberculosis 12/05/2018   Neurotic excoriations 11/01/2018   Polyarthralgia 06/11/2015   Systemic lupus erythematosus with lung involvement (HCC) 03/09/2015    Orientation RESPIRATION BLADDER Height & Weight     Self, Time, Situation, Place  Normal Continent Weight: 140 lb 6.9 oz (63.7 kg) Height:  5' 11 (180.3 cm)  BEHAVIORAL SYMPTOMS/MOOD NEUROLOGICAL BOWEL NUTRITION STATUS      Continent Diet (see dc summary)  AMBULATORY STATUS  COMMUNICATION OF NEEDS Skin   Total Care Verbally Normal                       Personal Care Assistance Level of Assistance  Bathing, Dressing, Feeding Bathing Assistance: Maximum assistance Feeding assistance: Limited assistance Dressing Assistance: Maximum assistance     Functional Limitations Info  Sight, Hearing, Speech Sight Info: Adequate Hearing Info: Adequate Speech Info: Adequate    SPECIAL CARE FACTORS FREQUENCY  PT (By licensed PT), OT (By licensed OT)     PT Frequency: 5x a week OT Frequency: 5x a week            Contractures Contractures Info: Not present    Additional Factors Info  Code Status, Allergies Code Status Info: Full Allergies Info: NKA           Current Medications (12/23/2023):  This is the current hospital active medication list Current Facility-Administered Medications  Medication Dose Route Frequency Provider Last Rate Last Admin   acetaminophen  (TYLENOL ) tablet 500 mg  500 mg Oral Q6H PRN Barbarann Nest, MD   500 mg at 12/23/23 9647   albuterol  (PROVENTIL ) (2.5 MG/3ML) 0.083% nebulizer solution 2.5 mg  2.5 mg Nebulization Q4H PRN Laurence Locus, DO       alteplase  (CATHFLO ACTIVASE ) injection 2 mg  2 mg Intracatheter Once PRN Jake Maisie Fellows, PA-C       anticoagulant sodium citrate  solution 5 mL  5 mL Intracatheter PRN Ejigiri, Ogechi Grace, PA-C       bisacodyl  (DULCOLAX) EC tablet 5 mg  5 mg Oral Daily PRN Arlon Carliss ORN,  DO   5 mg at 12/22/23 1157   bisacodyl  (DULCOLAX) suppository 10 mg  10 mg Rectal Daily PRN Arlon Carliss ORN, DO       carvedilol  (COREG ) tablet 25 mg  25 mg Oral BID Laurence Locus, DO   25 mg at 12/20/23 9082   ceFAZolin  (ANCEF ) IVPB 1 g/50 mL premix  1 g Intravenous Q24H Arlon Carliss ORN, DO   Stopped at 12/22/23 1232   Chlorhexidine  Gluconate Cloth 2 % PADS 6 each  6 each Topical Q0600 Geralynn Charleston, MD   6 each at 12/22/23 [phone]   folic acid  (FOLVITE ) tablet 1 mg  1 mg Oral Daily Arlon Carliss ORN, DO   1  mg at 12/22/23 1031   heparin  injection 1,000 Units  1,000 Units Intracatheter PRN Jake Maisie Fellows, PA-C       hydroxychloroquine  (PLAQUENIL ) tablet 200 mg  200 mg Oral BID Melia Lynwood ORN, MD   200 mg at 12/22/23 2219   levothyroxine  (SYNTHROID ) tablet 50 mcg  50 mcg Oral QAC breakfast Laurence Locus, DO   50 mcg at 12/23/23 9344   lidocaine  (PF) (XYLOCAINE ) 1 % injection 5 mL  5 mL Intradermal PRN Jake Maisie Fellows, PA-C       lidocaine -EPINEPHrine  (XYLOCAINE  W/EPI) 1 %-1:100000 (with pres) injection 10 mL  10 mL Intradermal Once McInnis, Caitlyn M, PA-C       lidocaine -EPINEPHrine  (XYLOCAINE  W/EPI) 1 %-1:100000 (with pres) injection 30 mL  30 mL Infiltration Once Omohundro, Jennifer C, NP       lidocaine -prilocaine  (EMLA ) cream 1 Application  1 Application Topical PRN Jake Maisie Fellows, PA-C       melatonin tablet 10 mg  10 mg Oral QHS PRN Laurence Locus, DO   10 mg at 12/22/23 0010   menthol -cetylpyridinium (CEPACOL) lozenge 3 mg  1 lozenge Oral PRN Arlon Carliss ORN, DO   3 mg at 12/22/23 1500   multivitamin with minerals tablet 1 tablet  1 tablet Oral Daily Arlon Carliss ORN, DO   1 tablet at 12/22/23 1031   ondansetron  (ZOFRAN ) tablet 4 mg  4 mg Oral Q6H PRN Laurence Locus, DO       Or   ondansetron  (ZOFRAN ) injection 4 mg  4 mg Intravenous Q6H PRN Laurence Locus, DO   4 mg at 12/16/23 0522   oxyCODONE  (Oxy IR/ROXICODONE ) immediate release tablet 5 mg  5 mg Oral Q4H PRN Barbarann Nest, MD   5 mg at 12/23/23 1123   pentafluoroprop-tetrafluoroeth (GEBAUERS) aerosol 1 Application  1 Application Topical PRN Ejigiri, Ogechi Grace, PA-C       polyethylene glycol (MIRALAX  / GLYCOLAX ) packet 17 g  17 g Oral Daily Arlon Carliss W, DO   17 g at 12/22/23 1031   predniSONE  (DELTASONE ) tablet 20 mg  20 mg Oral Q breakfast Melia Lynwood ORN, MD       thiamine  (VITAMIN B1) tablet 100 mg  100 mg Oral Daily Arlon Carliss W, DO   100 mg at 12/22/23 1031   Or   thiamine  (VITAMIN B1) injection 100 mg  100 mg  Intravenous Daily Arlon Carliss ORN, DO         Discharge Medications: Please see discharge summary for a list of discharge medications.  Relevant Imaging Results:  Relevant Lab Results:   Additional Information SSN [id number]  [place], LCSWA

## 2023-12-23 NOTE — Plan of Care (Signed)
  Problem: Clinical Measurements: Goal: Diagnostic test results will improve Outcome: Completed/Met

## 2023-12-23 NOTE — Progress Notes (Signed)
 [facility] Progress Note   Subjective: Seen [facility] - on dialysis. BP 94/61. Alert, oriented. Complains of abdominal pain.    Objective Vitals:   12/23/23 0900 12/23/23 0930 12/23/23 0938 12/23/23 1000  BP:  (!) 86/61 94/61   Pulse: 83 84 88 86  Resp: 16 12 12 18   Temp:      TempSrc:      SpO2: 97% 96% 95% 97%  Weight:      Height:       Physical Exam Gen: Lying in bed, nad  Pulm: Normal wob CV: RRR Abd: soft, tender to the touch Ext: 1+ LE edema Access: LUE AVF +bruit.   Additional Objective Labs: Basic Metabolic Panel: Recent Labs  Lab 12/19/23 0834 12/20/23 0542 12/21/23 0509 12/22/23 0441 12/23/23 0605  NA 133* 129* 128* 130* 130*  K 4.1 3.9 4.4 4.2 4.9  CL 95* 96* 90* 94* 94*  CO2 25 26 25 26 26   GLUCOSE 86 137* 86 129* 81  BUN 33* 45* 54* 34* 46*  CREATININE 5.32* 6.28* 7.19* 5.03* 6.56*  CALCIUM  7.9* 7.7* 8.0* 7.6* 7.7*  PHOS 4.3 4.0  --  4.6  --    Liver Function Tests: Recent Labs  Lab 12/19/23 0834 12/20/23 0542 12/23/23 0605  AST 28 31 29   ALT 16 15 11   ALKPHOS 45 49 75  BILITOT 1.1 0.5 0.7  PROT 6.6 6.0* 6.3*  ALBUMIN  <1.5* <1.5* <1.5*   No results for input(s): LIPASE, AMYLASE in the last 168 hours.  CBC: Recent Labs  Lab 12/19/23 0834 12/20/23 0542 12/21/23 0509 12/22/23 0441 12/23/23 0605  WBC 3.0* 2.9* 5.3 8.0 8.5  NEUTROABS  --   --   --   --  6.4  HGB 10.3* 9.9* 9.9* 10.6* 10.9*  HCT 32.8* 30.9* 31.0* 33.4* 33.9*  MCV 89.1 87.8 86.8 87.7 86.9  PLT 97* 91* 107* 86* 92*   Medications:  anticoagulant sodium citrate       ceFAZolin  (ANCEF ) IV Stopped (12/22/23 1232)    carvedilol   25 mg Oral BID   Chlorhexidine  Gluconate Cloth  6 each Topical Q0600   folic acid   1 mg Oral Daily   hydroxychloroquine   200 mg Oral BID   levothyroxine   50 mcg Oral QAC breakfast   lidocaine -EPINEPHrine   10 mL Intradermal Once   lidocaine -EPINEPHrine   30 mL Infiltration Once   multivitamin with minerals  1 tablet Oral  Daily   polyethylene glycol  17 g Oral Daily   predniSONE   20 mg Oral Q breakfast   thiamine   100 mg Oral Daily   Or   thiamine   100 mg Intravenous Daily   Home bp meds: Coreg  25 bid Hydralazine 25 bid (not taking) Irbesartan  300 mg every day      OP HD: SW TTS 3h  B400  71kg   2K bath  AVF   Heparin  none     Assessment/ Plan: Peritonitis.  CT showed findings suspicious for SBP. Fluid cx + Klebsiella. On cefazolin . Per primary  Acute encephalopathy. c/w alcohol withdrawal. On CIWA protocol  ESRD: HD TTS. Next HD 8/28.  HTN. BP acceptable Continue home meds.  Volume: UF as able.  Anemia. Hgb 10.  no esa needs. Follow.  Lupus: followed by rheumatology. Family concerned about flare. Per primary  BMD: Ca/phos acceptable. Follow trends.  Recurrent ascites: has been getting paracenteses since 2022. Had paracentesis 8/20, 8/26.   Maisie Ronnald Acosta PA-C [facility] 12/23/2023,10:47 AM

## 2023-12-23 NOTE — Progress Notes (Signed)
 PT Cancellation Note  Patient Details Name: [name] MRN: [id number] DOB: 15-Oct-1970   Cancelled Treatment:    Reason Eval/Treat Not Completed: (P) Patient at procedure or test/unavailable. Pt at HD. Will plan to follow-up later as time permits.   [name], PT, DPT Acute Rehabilitation Services  Office: [phone]    Angel CHRISTELLA Pennington 12/23/2023, 10:16 AM

## 2023-12-23 NOTE — Consult Note (Addendum)
 Consultation  Referring Provider: TRH/ Yates Primary Care Physician:  Berneta Elsie Sayre, MD Primary Gastroenterologist: Sampson  Reason for Consultation: Chronic ascites, acute peritonitis, concern for underlying cirrhosis  HPI: [name] is a 53 y.o. male with history of end-stage renal disease on hemodialysis, lupus, congestive heart failure with reduced EF/35 to 40% who was admitted 9 days ago with acute abdominal pain, generalized and onset the evening prior to admission. Lactate was elevated at 2, WBC 1.7/hemoglobin 12.4/platelets 77. BNP 1756 Patient known to have chronic ascites, bedside paracentesis was done in the ER with cell count showing total nucleated cells of 3650/neutrophil count 95, culture showed few WBCs abundant gram-negative rods and culture positive for Klebsiella pneumonia. Blood cultures have been negative He underwent acute hepatitis testing, all negative.  CT abdomen and pelvis with contrast on admission shows normal hepatic density, normal-sized spleen, mild small bowel wall thickening, mild diffuse colonic wall thickening, diverticulosis probable reactive prominent retroperitoneal lymph nodes, mesenteric veins all patent, moderate to large volume ascites again noted similar to previous, there is apparent new peritoneal thickening without nodularity concerning for peritonitis  Has been on a course of IV Rocephin  .  Patient developed acute metabolic encephalopathy onset 1/77/7974 per notes with anxiety delirium paranoia hallucinations, there is been concern for acute EtOH withdrawal and he has been started on CIWA protocol. It is unclear how much patient has been drinking at home, he is not able to answer me accurately today, says he drinks beer primarily,  He has had repeat paracenteses on 8/20 and on 12/21/2023 but no repeat cell counts etc. sent.  Last paracentesis with 700 cc removed.  Review of records shows the patient has had ascites since  2022, and has undergone multiple paracenteses. Paracentesis from January 2022-included cytology which showed no malignant cells, reactive mesothelial cells, cell counts without evidence for underlying infection Fluid albumin  0.8/total protein less than 3  SAAG=0.8    He had pleural effusion in 2017 which required thoracentesis. He has undergone previous bone marrow biopsy 2020 for pancytopenia-results noted normocellular bone marrow mild plasmacytosis.  Renal biopsy 2022 foci of glomerulosclerosis  Patient was seen while in dialysis today, disoriented x 2, unable to offer any helpful history.  Continuing to complain of abdominal pain.   Past Medical History:  Diagnosis Date   ESRD on hemodialysis (HCC)    TTS at [facility])   Lupus    Thyroid  disease     Past Surgical History:  Procedure Laterality Date   AV FISTULA PLACEMENT Left 08/26/2020   Procedure: LEFT RADIOCEPHALIC ARTERIOVENOUS (AV) FISTULA CREATION;  Surgeon: Eliza Lonni RAMAN, MD;  Location: [facility] OR;  Service: Vascular;  Laterality: Left;   IR FLUORO GUIDE CV LINE RIGHT  04/30/2020   IR PARACENTESIS  05/07/2020   IR PARACENTESIS  05/10/2020   IR PARACENTESIS  06/03/2020   IR PARACENTESIS  06/24/2020   IR PARACENTESIS  07/15/2020   IR PARACENTESIS  07/29/2020   IR PARACENTESIS  08/14/2020   IR PARACENTESIS  06/11/2021   IR PARACENTESIS  08/08/2021   IR PARACENTESIS  09/12/2021   IR PARACENTESIS  10/10/2021   IR PARACENTESIS  03/09/2022   IR PARACENTESIS  07/28/2023   IR PARACENTESIS  09/03/2023   IR PARACENTESIS  10/01/2023   IR PARACENTESIS  12/15/2023   IR PARACENTESIS  12/21/2023   IR PERC TUN PERIT CATH WO PORT S&I /IMAG  05/03/2020   IR US  GUIDE VASC ACCESS RIGHT  04/30/2020  IR US  GUIDE VASC ACCESS RIGHT  05/03/2020    Prior to Admission medications   Medication Sig Start Date End Date Taking? Authorizing Provider  Calcium  Acetate 667 MG TABS Take 667 mg by mouth 3 (three) times daily before meals.   Yes [provider]  ibuprofen  (ADVIL ) 200 MG tablet Take 800 mg by mouth every 6 (six) hours as needed for moderate pain (pain score 4-6) or headache.   Yes [provider]    Current Facility-Administered Medications  Medication Dose Route Frequency Provider Last Rate Last Admin   acetaminophen  (TYLENOL ) tablet 500 mg  500 mg Oral Q6H PRN Barbarann Nest, MD   500 mg at 12/23/23 0352   albuterol  (PROVENTIL ) (2.5 MG/3ML) 0.083% nebulizer solution 2.5 mg  2.5 mg Nebulization Q4H PRN Laurence Locus, DO       bisacodyl  (DULCOLAX) EC tablet 5 mg  5 mg Oral Daily PRN Arlon Carliss ORN, DO   5 mg at 12/22/23 1157   bisacodyl  (DULCOLAX) suppository 10 mg  10 mg Rectal Daily PRN Arlon Carliss ORN, DO       carvedilol  (COREG ) tablet 25 mg  25 mg Oral BID Laurence Locus, DO   25 mg at 12/20/23 9082   ceFAZolin  (ANCEF ) IVPB 1 g/50 mL premix  1 g Intravenous Q24H Arlon Carliss ORN, DO   Stopped at 12/22/23 1232   Chlorhexidine  Gluconate Cloth 2 % PADS 6 each  6 each Topical Q0600 Geralynn Charleston, MD   6 each at 12/22/23 [phone]   folic acid  (FOLVITE ) tablet 1 mg  1 mg Oral Daily Arlon Carliss W, DO   1 mg at 12/23/23 1424   hydroxychloroquine  (PLAQUENIL ) tablet 200 mg  200 mg Oral BID Melia Lynwood ORN, MD   200 mg at 12/22/23 2219   levothyroxine  (SYNTHROID ) tablet 50 mcg  50 mcg Oral QAC breakfast Laurence Locus, DO   50 mcg at 12/23/23 9344   lidocaine -EPINEPHrine  (XYLOCAINE  W/EPI) 1 %-1:100000 (with pres) injection 10 mL  10 mL Intradermal Once McInnis, Caitlyn M, PA-C       lidocaine -EPINEPHrine  (XYLOCAINE  W/EPI) 1 %-1:100000 (with pres) injection 30 mL  30 mL Infiltration Once Omohundro, Jennifer C, NP       melatonin tablet 10 mg  10 mg Oral QHS PRN Laurence Locus, DO   10 mg at 12/22/23 0010   menthol -cetylpyridinium (CEPACOL) lozenge 3 mg  1 lozenge Oral PRN Arlon Carliss ORN, DO   3 mg at 12/22/23 1500   multivitamin with minerals tablet 1 tablet  1 tablet Oral Daily Arlon Carliss ORN, DO   1 tablet at 12/23/23 1425    ondansetron  (ZOFRAN ) tablet 4 mg  4 mg Oral Q6H PRN Laurence Locus, DO       Or   ondansetron  (ZOFRAN ) injection 4 mg  4 mg Intravenous Q6H PRN Laurence Locus, DO   4 mg at 12/16/23 0522   oxyCODONE  (Oxy IR/ROXICODONE ) immediate release tablet 5 mg  5 mg Oral Q4H PRN Barbarann Nest, MD   5 mg at 12/23/23 1123   polyethylene glycol (MIRALAX  / GLYCOLAX ) packet 17 g  17 g Oral Daily Arlon Carliss ORN, DO   17 g at 12/22/23 1031   predniSONE  (DELTASONE ) tablet 20 mg  20 mg Oral Q breakfast Melia Lynwood ORN, MD   20 mg at 12/23/23 1424   thiamine  (VITAMIN B1) tablet 100 mg  100 mg Oral Daily Arlon Carliss ORN, DO   100 mg at 12/23/23 1427   Or  thiamine  (VITAMIN B1) injection 100 mg  100 mg Intravenous Daily Arlon Carliss ORN, DO        Allergies as of 12/14/2023   (No Known Allergies)    Family History  Problem Relation Age of Onset   Cancer Mother     Social History   Socioeconomic History   Marital status: Single    Spouse name: Not on file   Number of children: Not on file   Years of education: Not on file   Highest education level: Not on file  Occupational History   Not on file  Tobacco Use   Smoking status: Never   Smokeless tobacco: Never  Vaping Use   Vaping status: Never Used  Substance and Sexual Activity   Alcohol use: Yes    Comment: 2-4 glasses of wine 2-3 times weekly   Drug use: No   Sexual activity: Not on file  Other Topics Concern   Not on file  Social History Narrative   Not on file   Social Drivers of Health   Financial Resource Strain: Not on file  Food Insecurity: No Food Insecurity (12/15/2023)   Hunger Vital Sign    Worried About Running Out of Food in the Last Year: Never true    Ran Out of Food in the Last Year: Never true  Transportation Needs: No Transportation Needs (12/15/2023)   PRAPARE - Administrator, Civil Service (Medical): No    Lack of Transportation (Non-Medical): No  Physical Activity: Not on file  Stress: Not on file  Social  Connections: Not on file  Intimate Partner Violence: Not At Risk (12/15/2023)   Humiliation, Afraid, Rape, and Kick questionnaire    Fear of Current or Ex-Partner: No    Emotionally Abused: No    Physically Abused: No    Sexually Abused: No    Review of Systems: Patient unable to offer  Physical Exam: Vital signs in last 24 hours: Temp:  [97.6 F (36.4 C)-98.3 F (36.8 C)] 97.8 F (36.6 C) (08/28 1223) Pulse Rate:  [79-89] 86 (08/28 1145) Resp:  [10-19] 19 (08/28 1230) BP: (86-124)/(57-77) 100/65 (08/28 1230) SpO2:  [94 %-99 %] 97 % (08/28 1145) Last BM Date : 12/21/23 General:   Alert,  Well-developed,, chronically ill-appearing thin white male cooperative in NAD Head:  Normocephalic and atraumatic.  Rash on forehead and cheeks Eyes:  Sclera clear, no icterus.   Conjunctiva pink. Ears:  Normal auditory acuity. Nose:  No deformity, discharge,  or lesions. Mouth:  No deformity or lesions.   Neck:  Supple; no masses or thyromegaly. Lungs:  Clear throughout to auscultation.   No wheezes, crackles, or rhonchi.  Heart:  Regular rate and rhythm; no murmurs, clicks, rubs,  or gallops. Abdomen:  Soft, fulfilling, positive nontense ascites, mild generalized tenderness, there is no rebound, no palpable mass or hepatosplenomegaly Rectal: Not done Msk:  Symmetrical without gross deformities. . Pulses: Trace edema bilateral lower extremities Extremities:  Without clubbing or edema. Neurologic:  Alert and  oriented x2   Skin:  Intact without significant lesions or rashes.. Psych:  Alert and cooperative.   Intake/Output from previous day: 08/27 0701 - 08/28 0700 In: 50 [IV Piggyback:50] Out: -  Intake/Output this shift: No intake/output data recorded.  Lab Results: Recent Labs    12/21/23 0509 12/22/23 0441 12/23/23 0605  WBC 5.3 8.0 8.5  HGB 9.9* 10.6* 10.9*  HCT 31.0* 33.4* 33.9*  PLT 107* 86* 92*   BMET Recent Labs  12/21/23 0509 12/22/23 0441 12/23/23 0605  NA  128* 130* 130*  K 4.4 4.2 4.9  CL 90* 94* 94*  CO2 25 26 26   GLUCOSE 86 129* 81  BUN 54* 34* 46*  CREATININE 7.19* 5.03* 6.56*  CALCIUM  8.0* 7.6* 7.7*   LFT Recent Labs    12/23/23 0605  PROT 6.3*  ALBUMIN  <1.5*  AST 29  ALT 11  ALKPHOS 75  BILITOT 0.7   PT/INR Recent Labs    12/23/23 0605  LABPROT 16.2*  INR 1.2   Hepatitis Panel No results for input(s): HEPBSAG, HCVAB, HEPAIGM, HEPBIGM in the last 72 hours.    IMPRESSION:   #23 53 year old white male with end-stage renal disease on hemodialysis, in setting of lupus, admitted 9 days ago with acute abdominal pain and workup consistent with acute bacterial peritonitis/cultures positive for Klebsiella.  No bacteremia  On Rocephin > cefazolin   Continued complaints of abdominal pain worrisome for persistent peritonitis  #2 severe hypoalbuminemia- chronic #3 history of EtOH abuse-no imaging evidence for cirrhosis EtOH withdrawal this admission  #4 chronic ascites present over the past 3 years, and status post multiple paracenteses Labs from 2022 with SAAG= 0.8 not suggestive of portal hypertensive etiology for ascites Per previous notes this has been felt secondary to renal failure and hypoalbuminemia  He has not had repeat fluid albumin  at [facility] this admission  At this time do not think that his ascites is secondary to underlying cirrhosis.  #5 hypothyroidism #6.  Congestive heart failure with reduced EF is 35 to 40% #7 chronic pancytopenia chronic previous bone marrow biopsy 2022 normal #8  Malnutrition multifactoral secondary to above  PLAN: Would repeat paracentesis tomorrow, diagnostic and send for repeat cell counts, Gram stain, culture albumin , total protein, cytology Continue IV antibiotics Discontinue all EtOH. GI will follow along  Amy EsterwoodPA-C  12/23/2023, 2:29 PM      ATTENDING ADDENDUM I personally saw the patient and performed a substantive portion of this encounter (>50% time  spent), including a complete performance of at least one of the key components (MDM, Hx and/or Exam), in conjunction with Amy Esterwood.  53 year old male with multiple comorbidities to include end-stage renal disease on HD, lupus, admitted with Klebsiella bacterial peritonitis.  We were consulted for evaluation of ascites, rule out cirrhosis.  He does have a history of alcohol abuse however there is no known history of cirrhosis.  He has had ascites for years now with multiple paracentesis.  Analysis in 2022 argues against portal hypertension as the likely cause (SAAG was 0.8).  He does have thrombocytopenia, however his liver appears normal on recent imaging and without splenomegaly.  He has severe hypoalbuminemia which is undetectable as well as a history of CHF.  Overall, I am not convinced he has cirrhosis, prior workup would argue against it.  That being said, to further evaluate I recommend repeat paracentesis.  Would repeat cell count to ensure improvement of peritonitis and repeat culture to ensure this is cleared on antibiotics.  Would also send total protein, albumin , cytology, LDH, glucose. Unfortunately these were not able to be added on to the last peritoneal fluid analysis and he will warrant repeat paracentesis to evaluate.  Further recommendations pending that result.  Call with questions.  Marcey Naval, MD [facility]

## 2023-12-23 NOTE — Progress Notes (Signed)
 Progress Note   Patient: [name] FMW:[id number] DOB: 03/26/1971 DOA: 12/14/2023     9 DOS: the patient was seen and examined on 12/23/2023   Brief hospital course: 53yo with h/o ESRD on TTS HD, SLE, ascites without diagnosis of cirrhosis, and chronic HFrEF who presented on 8/19 with abdominal pain.  Last paracentesis was in 09/2023.  Bedside paracentesis performed with high concern for SBP, cultures positive for Klebsiella, treated with Ceftriaxone  -> Cefazolin  x 14 days.  IR performed paracentesis on 8/20 with 1.8L removed and repeat on 8/26 with 700 cc removed; etiology is thought to be related to severely low albumin .  Developed severe acute metabolic encephalopathy from alcohol withdrawal which has since improved.  He is recommended for SNF rehab.  Assessment and Plan:  Spontaneous bacterial peritonitis Patient with known h/o ascites but no documented cirrhosis (see below) Initial peritoneal fluid collection noting greater than 3600 WBCs, 95% neutrophils Culture with Klebsiella Transition ceftriaxone  to cefazolin  for total of 14 days   SBP prophylaxis should be continued for those with h/o SBP with either Bactrim  DS or Ciprofloxacin  Repeat paracentesis on 8/26 was not sent for analysis -it appears to be too late to add cell count/diff, culture, cytology, protein, albumin    Acute metabolic encephalopathy Likely related to alcohol withdrawal Beginning 8/22 (3 days after presentation) patient appeared very anxious, delirious, paranoid, even hallucinating Discussion with sister - patient does drink to cover pain from his lupus, has reduced his alcohol consumption, reports drinking 1.5 pints of alcohol at a time periodically but denies daily drinking Currently on CIWA protocol Will be very sparing with any sedative type medications  Appears to be improving daily    Large volume ascites IR paracentesis performed 8/20 with 1.8 L removed Patient having worsening abdominal tightness and  tenderness Ordered repeat therapeutic paracentesis 8/26, yielding only Sister reports prior liver biopsy ruling out cirrhosis about 2-3 years ago and performed here but I am not able to appreciate any pathology results from prior liver biopsy done in our system at this time Given his h/o SLE in addition to ETOH consumption, cirrhosis remains probable in the setting of recurrent ascites CT is not suggestive of cirrhosis, although there is moderate to large volume ascites Awaiting GI consult   Systemic lupus erythematosus with lung involvement Sister is concerned that his presentation is related to SLE Dr. Melia notes that lupus flares are less common in HD patients but still possible Will terat with Plaquenil  200 mg PO BID and prednisone  20 mg daily for now He has an ongoing need to follow-up closely as lupus appears to be underlying cause of many of his comorbidities both directly and indirectly Diffuse pain is not improved today despite addition of medications as above   Abdominal pain Possibly related to SBP vs. R inguinal hernia with significant R scrotal edema vs. SLE flare vs. Other Has been on Norco Will limit Tylenol  to 2 grams per day Change hydrocodone  to oxy 5 mg 4h prn severe pain Plaquenil  and prednisone  also ordered, as above    ESRD Due to glomerular disease in SLE Continue HD TTS   Nephrology consulting   Chronic HFpEF Appears euvolemic, although scrotal edema and recurrent ascites persists Volume per nephrology/HD/UF   Hypertension Continue carvedilol    Hypothyroidism TSH 7.89 Initiated on Synthroid  50 micrograms daily Follow up TFTs in 4-6 weeks   Chronic pancytopenia Long-standing issue - possibly related to SLE and immunosuppression therapies but also could be related to underlying  liver derangement Currently stable SCDs for DVT prophylaxis   Goals of care Patient has multiple comorbidities, very frail, low albumin , poor p.o. intake Discussed at  length with patient's sister about need for close follow-up with multiple subspecialists PT/OT consulted and are recommending STR Patient/sister are in agreement with STR at this time Will need to start a bed search [facility] consulted             Consultants: Nephrology  IR GI PT OT TOC team   Procedures: Paracentesis, 8/19, 8/20, 8/26   Antibiotics: Ceftriaxone  8/19-26 Metronidazole  8/19-22 Cefazolin  8/27-9/7    30 Day Unplanned Readmission Risk Score    Flowsheet Row ED to Hosp-Admission (Current) from 12/14/2023 in [facility]  30 Day Unplanned Readmission Risk Score (%) 17.95 Filed at 12/23/2023 0401    This score is the patient's risk of an unplanned readmission within 30 days of being discharged (0 -100%). The score is based on dignosis, age, lab data, medications, orders, and past utilization.   Low:  0-14.9   Medium: 15-21.9   High: 22-29.9   Extreme: 30 and above           Subjective: Continues to complain of diffuse pain - lower abdomen, elbows, legs.   Objective: Vitals:   12/23/23 1223 12/23/23 1230  BP: 100/64 100/65  Pulse:    Resp: 16 19  Temp: 97.8 F (36.6 C)   SpO2:      Intake/Output Summary (Last 24 hours) at 12/23/2023 1340 Last data filed at 12/23/2023 1230 Gross per 24 hour  Intake 50 ml  Output 0 ml  Net 50 ml   Filed Weights   12/18/23 1025 12/18/23 1502 12/21/23 1300  Weight: 71.1 kg 68.1 kg 63.7 kg    Exam:  General:  Appears frail, cachectic, chronically ill Eyes:  normal lids, iris ENT:  grossly normal hearing, lips & tongue, mmm; suboptimal dentition Cardiovascular:  RRR. 1+ LE edema.  Respiratory:   CTA bilaterally with no wheezes/rales/rhonchi.  Normal respiratory effort. Abdomen:  moderate ascites, diffusely TTP, marked R scrotal edema Skin:  no rash or induration seen on limited exam Musculoskeletal:  generally poor strength, no bony abnormality Psychiatric:  blunted mood and affect, speech  fluent and appropriate, AOx3 Neurologic:  no gross abnormalities noted on limited exam  Data Reviewed: I have reviewed the patient's lab results since admission.  Pertinent labs for today include:   Na++ 130 BUN 46/Creatinine 6.56/GFR 9 Albumin  <1.5 WBC 8.5 Hgb 10.9 Platelets 92 INR 1.2     Family Communication: None present; I spoke briefly with his sister at the bedside while he was at HD  Disposition: Status is: Inpatient Remains inpatient appropriate because: ongoing management     Time spent: 50 minutes  Unresulted Labs (From admission, onward)     Start     Ordered   12/24/23 0500  Comprehensive metabolic panel with GFR  Tomorrow morning,   R       Question:  Specimen collection method  Answer:  Lab=Lab collect   12/23/23 1340   12/24/23 0500  CBC with Differential/Platelet  Tomorrow morning,   R       Question:  Specimen collection method  Answer:  Lab=Lab collect   12/23/23 1340             Author: Delon Herald, MD 12/23/2023 1:40 PM  For on call review [email or web address].

## 2023-12-24 ENCOUNTER — Inpatient Hospital Stay (HOSPITAL_COMMUNITY)

## 2023-12-24 DIAGNOSIS — N186 End stage renal disease: Secondary | ICD-10-CM | POA: Diagnosis not present

## 2023-12-24 DIAGNOSIS — R188 Other ascites: Secondary | ICD-10-CM | POA: Diagnosis not present

## 2023-12-24 DIAGNOSIS — Z992 Dependence on renal dialysis: Secondary | ICD-10-CM | POA: Diagnosis not present

## 2023-12-24 DIAGNOSIS — K652 Spontaneous bacterial peritonitis: Secondary | ICD-10-CM | POA: Diagnosis not present

## 2023-12-24 HISTORY — PX: IR PARACENTESIS: IMG2679

## 2023-12-24 LAB — PROTEIN, PLEURAL OR PERITONEAL FLUID: Total protein, fluid: <3 g/dL

## 2023-12-24 LAB — COMPREHENSIVE METABOLIC PANEL WITH GFR
ALT: 10 U/L (ref 0–44)
AST: 28 U/L (ref 15–41)
Albumin: 1.6 g/dL — ABNORMAL LOW (ref 3.5–5.0)
Alkaline Phosphatase: 89 U/L (ref 38–126)
Anion gap: 10 (ref 5–15)
BUN: 30 mg/dL — ABNORMAL HIGH (ref 6–20)
CO2: 28 mmol/L (ref 22–32)
Calcium: 7.8 mg/dL — ABNORMAL LOW (ref 8.9–10.3)
Chloride: 92 mmol/L — ABNORMAL LOW (ref 98–111)
Creatinine, Ser: 4.6 mg/dL — ABNORMAL HIGH (ref 0.61–1.24)
GFR, Estimated: 14 mL/min — ABNORMAL LOW (ref >60)
Glucose, Bld: 149 mg/dL — ABNORMAL HIGH (ref 70–99)
Potassium: 4.8 mmol/L (ref 3.5–5.1)
Sodium: 130 mmol/L — ABNORMAL LOW (ref 135–145)
Total Bilirubin: 0.6 mg/dL (ref 0.0–1.2)
Total Protein: 6.6 g/dL (ref 6.5–8.1)

## 2023-12-24 LAB — GLUCOSE, PLEURAL OR PERITONEAL FLUID: Glucose, Fluid: <20 mg/dL

## 2023-12-24 LAB — CBC WITH DIFFERENTIAL/PLATELET
Abs Immature Granulocytes: 0.42 K/uL — ABNORMAL HIGH (ref 0.00–0.07)
Basophils Absolute: 0.1 K/uL (ref 0.0–0.1)
Basophils Relative: 1 %
Eosinophils Absolute: 0 K/uL (ref 0.0–0.5)
Eosinophils Relative: 0 %
HCT: 32.6 % — ABNORMAL LOW (ref 39.0–52.0)
Hemoglobin: 10.4 g/dL — ABNORMAL LOW (ref 13.0–17.0)
Immature Granulocytes: 6 %
Lymphocytes Relative: 6 %
Lymphs Abs: 0.4 K/uL — ABNORMAL LOW (ref 0.7–4.0)
MCH: 28 pg (ref 26.0–34.0)
MCHC: 31.9 g/dL (ref 30.0–36.0)
MCV: 87.6 fL (ref 80.0–100.0)
Monocytes Absolute: 0.3 K/uL (ref 0.1–1.0)
Monocytes Relative: 5 %
Neutro Abs: 5.3 K/uL (ref 1.7–7.7)
Neutrophils Relative %: 82 %
Platelets: 88 K/uL — ABNORMAL LOW (ref 150–400)
RBC: 3.72 MIL/uL — ABNORMAL LOW (ref 4.22–5.81)
RDW: 17.7 % — ABNORMAL HIGH (ref 11.5–15.5)
Smear Review: NORMAL
WBC: 6.5 K/uL (ref 4.0–10.5)
nRBC: 0 % (ref 0.0–0.2)

## 2023-12-24 LAB — BODY FLUID CELL COUNT WITH DIFFERENTIAL
Eos, Fluid: 0 %
Lymphs, Fluid: 0 %
Monocyte-Macrophage-Serous Fluid: 1 % — ABNORMAL LOW (ref 50–90)
Neutrophil Count, Fluid: 99 % — ABNORMAL HIGH (ref 0–25)
Total Nucleated Cell Count, Fluid: 10425 uL — ABNORMAL HIGH (ref 0–1000)

## 2023-12-24 LAB — LACTATE DEHYDROGENASE, PLEURAL OR PERITONEAL FLUID: LD, Fluid: >2500 U/L — ABNORMAL HIGH (ref 3–23)

## 2023-12-24 LAB — ALBUMIN, PLEURAL OR PERITONEAL FLUID: Albumin, Fluid: <1.5 g/dL

## 2023-12-24 MED ORDER — PREDNISONE 5 MG PO TABS
15.0000 mg | ORAL_TABLET | Freq: Every day | ORAL | Status: DC
  Administered 2023-12-25 – 2024-01-03 (×9): 15 mg via ORAL
  Filled 2023-12-24 (×9): qty 1

## 2023-12-24 MED ORDER — LIDOCAINE HCL 1 % IJ SOLN
20.0000 mL | Freq: Once | INTRAMUSCULAR | Status: AC
  Administered 2023-12-24: 10 mL

## 2023-12-24 MED ORDER — DOCUSATE SODIUM 100 MG PO CAPS
100.0000 mg | ORAL_CAPSULE | Freq: Two times a day (BID) | ORAL | Status: DC
  Administered 2023-12-24 – 2023-12-26 (×3): 100 mg via ORAL
  Filled 2023-12-24 (×14): qty 1

## 2023-12-24 MED ORDER — ORAL CARE MOUTH RINSE: 15.0000 mL | OROMUCOSAL | Status: DC | PRN

## 2023-12-24 NOTE — Progress Notes (Signed)
 [facility] Progress Note   Subjective: Seen in room. More alert and interactive today. Says abdominal pain has improved, feels better. Denies cp, sob, n/v.   Objective Vitals:   12/23/23 1453 12/23/23 2004 12/24/23 0300 12/24/23 0902  BP: 103/63 105/72 113/70 116/70  Pulse: 90 83 74 68  Resp: 20 20 19 19   Temp: 98.3 F (36.8 C) 98.2 F (36.8 C) 98.2 F (36.8 C) 98.4 F (36.9 C)  TempSrc:  Oral Oral   SpO2: 93% 94% 95% 98%  Weight:      Height:       Physical Exam Gen: Lying in bed, nad  Pulm: Normal wob CV: RRR Abd: soft, tender to the touch Ext: Trace  LE edema Access: LUE AVF +bruit.   Additional Objective Labs: Basic Metabolic Panel: Recent Labs  Lab 12/19/23 0834 12/20/23 0542 12/21/23 0509 12/22/23 0441 12/23/23 0605 12/24/23 0414  NA 133* 129*   < > 130* 130* 130*  K 4.1 3.9   < > 4.2 4.9 4.8  CL 95* 96*   < > 94* 94* 92*  CO2 25 26   < > 26 26 28   GLUCOSE 86 137*   < > 129* 81 149*  BUN 33* 45*   < > 34* 46* 30*  CREATININE 5.32* 6.28*   < > 5.03* 6.56* 4.60*  CALCIUM  7.9* 7.7*   < > 7.6* 7.7* 7.8*  PHOS 4.3 4.0  --  4.6  --   --    < > = values in this interval not displayed.   Liver Function Tests: Recent Labs  Lab 12/20/23 0542 12/23/23 0605 12/24/23 0414  AST 31 29 28   ALT 15 11 10   ALKPHOS 49 75 89  BILITOT 0.5 0.7 0.6  PROT 6.0* 6.3* 6.6  ALBUMIN  <1.5* <1.5* 1.6*   No results for input(s): LIPASE, AMYLASE in the last 168 hours.  CBC: Recent Labs  Lab 12/20/23 0542 12/21/23 0509 12/22/23 0441 12/23/23 0605 12/24/23 0414  WBC 2.9* 5.3 8.0 8.5 6.5  NEUTROABS  --   --   --  6.4 5.3  HGB 9.9* 9.9* 10.6* 10.9* 10.4*  HCT 30.9* 31.0* 33.4* 33.9* 32.6*  MCV 87.8 86.8 87.7 86.9 87.6  PLT 91* 107* 86* 92* 88*   Medications:   ceFAZolin  (ANCEF ) IV 1 g (12/23/23 1433)    carvedilol   25 mg Oral BID   Chlorhexidine  Gluconate Cloth  6 each Topical Q0600   folic acid   1 mg Oral Daily   hydroxychloroquine   200 mg  Oral BID   levothyroxine   50 mcg Oral QAC breakfast   lidocaine -EPINEPHrine   10 mL Intradermal Once   lidocaine -EPINEPHrine   30 mL Infiltration Once   multivitamin with minerals  1 tablet Oral Daily   polyethylene glycol  17 g Oral Daily   predniSONE   20 mg Oral Q breakfast   thiamine   100 mg Oral Daily   Or   thiamine   100 mg Intravenous Daily   Home bp meds: Coreg  25 bid Hydralazine 25 bid (not taking) Irbesartan  300 mg every day      OP HD: SW TTS 3h  B400  71kg   2K bath  AVF   Heparin  none     Assessment/ Plan: Peritonitis. CT showed findings suspicious for SBP. Fluid cx + Klebsiella. On cefazolin . Per primary  Acute encephalopathy. c/w alcohol withdrawal. On CIWA protocol  ESRD: HD TTS. Next HD 8/30 HTN. BP acceptable Continue home meds.  Volume: UF  as able.  Anemia. Hgb 10.  no esa needs. Follow.  Lupus: followed by rheumatology. Serologies done here suggestive of flare. Recommended to start plaquenil  and prednisone  course.   BMD: CorrCa/phos acceptable. Follow trends.  Recurrent ascites: has been getting paracenteses since 2022. Had paracentesis 8/20, 8/26.   Maisie Ronnald Acosta PA-C [facility] 12/24/2023,10:08 AM

## 2023-12-24 NOTE — TOC Progression Note (Addendum)
 Transition of Care [facility]) - Progression Note    Patient Details  Name: [name] MRN: [id number] Date of Birth: 02-13-1971  Transition of Care [facility]) CM/SW Contact  Lendia Dais, [place] Phone Number: 12/24/2023, 3:26 PM  Clinical Narrative:  CSW spoke to pt and pt's cousin Lean at bedside.   CSW explained the current barriers with SNF placement due to their insurance being out of network. Pt declined offer at [facility] due to the rating.   CSW mentioned the options of home health or OPPT. Patient has preference for home health. CSW explained that there would be a copay associated with HH and they may come out between 1-3 a week. Pt stated understanding. Pt requested that his sister Rosaline be included in the process of setting up [facility].  CSW will continue to follow.     Expected Discharge Plan: Home/Self Care Barriers to Discharge: Continued Medical Work up               Expected Discharge Plan and Services In-house Referral: Clinical Social Work     Living arrangements for the past 2 months: Single Family Home                                       Social Drivers of Health (SDOH) Interventions SDOH Screenings   Food Insecurity: No Food Insecurity (12/15/2023)  Housing: Low Risk  (12/15/2023)  Transportation Needs: No Transportation Needs (12/15/2023)  Utilities: Not At Risk (12/15/2023)  Depression (PHQ2-9): Low Risk  (01/02/2022)  Tobacco Use: Low Risk  (12/14/2023)    Readmission Risk Interventions     No data to display

## 2023-12-24 NOTE — Progress Notes (Addendum)
 Progress Note   Patient: [name] FMW:[id number] DOB: 1970-06-07 DOA: 12/14/2023     10 DOS: the patient was seen and examined on 12/24/2023   Brief hospital course: 53yo with h/o ESRD on TTS HD, SLE, ascites without diagnosis of cirrhosis, and chronic HFrEF who presented on 8/19 with abdominal pain.  Last paracentesis was in 09/2023.  Bedside paracentesis performed with high concern for SBP, cultures positive for Klebsiella, treated with Ceftriaxone  -> Cefazolin  x 14 days.  IR performed paracentesis on 8/20 with 1.8L removed and repeat on 8/26 with 700 cc removed; etiology is thought to be related to severely low albumin .  Developed severe acute metabolic encephalopathy from alcohol withdrawal which has since improved.  Repeat paracentesis ordered.  GI is consulting.  He is recommended for SNF rehab.  Assessment and Plan:  Spontaneous bacterial peritonitis Patient with known h/o ascites but no documented cirrhosis (see below) Initial peritoneal fluid collection noting greater than 3600 WBCs, 95% neutrophils Culture with Klebsiella Transition ceftriaxone  to cefazolin  for total of 14 days   SBP prophylaxis should be continued for those with h/o SBP with either Bactrim  DS or Ciprofloxacin  Repeat paracentesis on 8/26 was not sent for analysis Repeat paracentesis ordered with studies to include cell count/diff, culture, cytology, protein, albumin    Acute metabolic encephalopathy Likely related to alcohol withdrawal Beginning 8/22 (3 days after presentation) patient appeared very anxious, delirious, paranoid, even hallucinating Discussion with sister - patient does drink to cover pain from his lupus, has reduced his alcohol consumption, reports drinking 1.5 pints of alcohol at a time periodically but denies daily drinking Currently on CIWA protocol Will be very sparing with any sedative type medications  Appears to be improving daily    Large volume ascites IR paracentesis performed 8/20  with 1.8 L removed Patient having worsening abdominal tightness and tenderness Ordered repeat therapeutic paracentesis 8/26, yielding only Sister reports prior liver biopsy ruling out cirrhosis about 2-3 years ago and performed here but I am not able to appreciate any pathology results from prior liver biopsy done in our system at this time Given his h/o SLE in addition to ETOH consumption, cirrhosis remains probable in the setting of recurrent ascites CT is not suggestive of cirrhosis, although there is moderate to large volume ascites GI consulting   Systemic lupus erythematosus with lung involvement Sister is concerned that his presentation is related to SLE Dr. Melia notes that lupus flares are less common in HD patients but still possible Treating with Plaquenil  200 mg PO BID and prednisone  20 mg daily and he is showing improvement He has an ongoing need to follow-up closely as lupus appears to be underlying cause of many of his comorbidities both directly and indirectly Nephrology recommends continuing treatment with Plaquenil  for at least 2 weeks and slowly tapering the steroids   Abdominal pain Possibly related to SBP vs. R inguinal hernia with significant R scrotal edema vs. SLE flare vs. 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 intake Discussed at length with patient's sister about need for close follow-up with multiple subspecialists PT/OT consulted and are recommending STR Patient/sister are in agreement with STR at this time He has been offered one bed thus far and patient/family are not satisfied with this option TOC consulted He is likely to be ready for dc in the next few days and will need to either accept a bed or plan for dc to home with family, likely             Consultants: Nephrology  IR GI PT OT TOC team Dietician   Procedures: Paracentesis, 8/19, 8/20, 8/26   Antibiotics: Ceftriaxone  8/19-26 Metronidazole  8/19-22 Cefazolin  8/27-9/7    30 Day Unplanned Readmission Risk Score    Flowsheet Row ED to Hosp-Admission (Current) from 12/14/2023 in [facility]  30 Day Unplanned Readmission Risk Score (%) 18.44 Filed at 12/24/2023 0801    This score is the patient's risk of an unplanned readmission within 30 days of being discharged (0 -100%). The score is based on dignosis, age, lab data, medications, orders, and past utilization.   Low:  0-14.9   Medium: 15-21.9   High: 22-29.9   Extreme: 30 and above           Subjective: Much more alert, conversant, having less reported pain today.   Objective: Vitals:   12/24/23 0300 12/24/23 0902  BP: 113/70 116/70  Pulse: 74 68  Resp: 19 19  Temp: 98.2 F (36.8 C) 98.4 F (36.9 C)  SpO2: 95% 98%    Intake/Output Summary (Last 24 hours) at 12/24/2023 1535 Last data filed at 12/24/2023 0900 Gross per 24 hour  Intake 970 ml  Output 0 ml  Net 970 ml   Filed Weights   12/18/23 1025 12/18/23 1502 12/21/23 1300  Weight: 71.1 kg 68.1 kg 63.7 kg    Exam:  General:  Appears frail, cachectic, chronically ill, much more alert and conversant Eyes:  normal lids,  iris ENT:  grossly normal hearing, lips & tongue, mmm; suboptimal dentition Cardiovascular:  RRR. 1+ LE edema.  Respiratory:   CTA bilaterally with no wheezes/rales/rhonchi.  Normal respiratory effort. Abdomen:  moderate ascites, diffusely TTP, marked R scrotal edema Skin:  no rash or induration seen on limited exam Musculoskeletal:  generally poor strength, no bony abnormality Psychiatric:  blunted mood and affect, speech fluent and appropriate, AOx3 Neurologic:  no gross abnormalities noted on limited exam  Data Reviewed: I have reviewed the patient's lab results since admission.  Pertinent labs for today include:   Na++ 130, stable Glucose 149 BUN 30/Creatinine 4.6/GFR 14 Albumin  1.6 WBC 6.5 Hgb 10.4, stable Platelets 88    Family Communication: Sister was present  Mobility: PT/OT Consulted and are recommending - Skilled Nursing-Short Term Rehab (<3 Hours/Day)12/23/2023 1700    Code Status: Full Code - discussed today, patient agrees and is not interested in palliative care  Barriers to discharge:  needs paracentesis and then placement vs. HH  Disposition: Status is: Inpatient Remains inpatient appropriate because: ongoing management     Time spent: 50 minutes  Unresulted Labs (From admission, onward)     Start     Ordered   12/24/23 1236  Body fluid cell count with  differential  RELEASE UPON ORDERING,   TIMED        12/24/23 1236   12/24/23 1236  Albumin , pleural or peritoneal fluid   RELEASE UPON ORDERING,   TIMED        12/24/23 1236   12/24/23 1236  Protein, pleural or peritoneal fluid  RELEASE UPON ORDERING,   TIMED        12/24/23 1236   12/24/23 1236  Glucose, pleural or peritoneal fluid  RELEASE UPON ORDERING,   TIMED        12/24/23 1236   12/24/23 1236  Lactate dehydrogenase (pleural or peritoneal fluid)  RELEASE UPON ORDERING,   TIMED        12/24/23 1236             Author: Delon Herald, MD 12/24/2023 3:35 PM  For on call review  [email or web address].

## 2023-12-24 NOTE — Progress Notes (Addendum)
      Progress Note   Subjective  Patient feeling better today, looks better. Denies pain. Had repeat paracentesis.    Objective   Vital signs in last 24 hours: Temp:  [98.2 F (36.8 C)-98.4 F (36.9 C)] 98.3 F (36.8 C) (08/29 1704) Pulse Rate:  [66-83] 66 (08/29 1704) Resp:  [19-20] 19 (08/29 1704) BP: (105-117)/(70-80) 117/80 (08/29 1704) SpO2:  [94 %-98 %] 97 % (08/29 1704) Last BM Date : 12/21/23 General:    white male in NAD Neurologic:  Alert and oriented,  grossly normal neurologically. Psych:  Cooperative. Normal mood and affect.  Intake/Output from previous day: 08/28 0701 - 08/29 0700 In: 670 [P.O.:620; IV Piggyback:50] Out: 0  Intake/Output this shift: Total I/O In: 800 [P.O.:800] Out: 0   Lab Results: Recent Labs    12/22/23 0441 12/23/23 0605 12/24/23 0414  WBC 8.0 8.5 6.5  HGB 10.6* 10.9* 10.4*  HCT 33.4* 33.9* 32.6*  PLT 86* 92* 88*   BMET Recent Labs    12/22/23 0441 12/23/23 0605 12/24/23 0414  NA 130* 130* 130*  K 4.2 4.9 4.8  CL 94* 94* 92*  CO2 26 26 28   GLUCOSE 129* 81 149*  BUN 34* 46* 30*  CREATININE 5.03* 6.56* 4.60*  CALCIUM  7.6* 7.7* 7.8*   LFT Recent Labs    12/24/23 0414  PROT 6.6  ALBUMIN  1.6*  AST 28  ALT 10  ALKPHOS 89  BILITOT 0.6   PT/INR Recent Labs    12/23/23 0605  LABPROT 16.2*  INR 1.2    Studies/Results: No results found.     Assessment / Plan:    53 year old male here for the following:  SBP - Klebsiella pneumonia Chronic ascites of unclear etiology End-stage renal disease on HD Hypoalbuminemia  Per consultation yesterday, he has had this ascites ongoing for a long time.  Multiple paracentesis in the past, SAAG was 0.8 in the past which argued against portal hypertension.  In regards to etiology of his ascites, he has no known history of cirrhosis and while he does have thrombocytopenia, his liver has appeared normal on imaging and without splenomegaly.  He has history of CHF as well  as severe hypoalbuminemia at baseline.  Not convinced he has cirrhosis. Ascites could just be from severe hypoalbuminemia / poor nutritional state in the setting of ESRD.  I recommended a repeat paracentesis to confirm improvement of his peritonitis on antibiotics and to send additional labs to help clarify source of his ascites.  Labs just came back this evening, peritonitis appears to persist with markedly elevated white count with left shift  Culture pending.  Unclear what is driving this and he has failed antibiotics it appears, although he clinically looks much better today.  I would recommend repeating a CT scan abdomen pelvis to make sure no abscess elsewhere or other clear source of this.  Would also get input from infectious disease about antibiotic regimen at this point in time while culture data is pending.  Odd presentation, his clinical appearance does not match the results of this fluid analysis.  Unfortunately given hypoalbuminemia in both the peritoneal fluid and serology, cannot calculate SAAG but again not convinced he has cirrhosis.  I discussed plan with Dr. Barbarann who will order CT and reach out to ID.  Dr. Rollin to assume the GI inpatient service this long weekend.  Marcey Naval, MD [facility]

## 2023-12-24 NOTE — Progress Notes (Signed)
 Occupational Therapy Treatment Patient Details Name: [name] MRN: [id number] DOB: [birth year] Today's Date: 12/24/2023   History of present illness Pt is a 53 y/o M admitted on 12/14/23 after presenting with c/o abdominal pain. Pt is being treated for spontaneous bacterial peritonitis, acute metabolic encephalopathy. PMH: ESRD on HD TTS, lupus, ascites, systolic CHF   OT comments  Patient demonstrating great gains this treatment session. Patient with decreased pain and improved cognition but continues to have short term memory deficits and increased time to follow commands. Patient able to ambulate around bed to recliner with min assist and not episodes of knees buckling. Patient will benefit from continued inpatient follow up therapy, <3 hours/day. Acute OT to continue to follow to address established goals to facilitate DC to next venue of care.        If plan is discharge home, recommend the following:  Two people to help with walking and/or transfers;A lot of help with bathing/dressing/bathroom;Assistance with cooking/housework;Assistance with feeding;Help with stairs or ramp for entrance;Assist for transportation;Direct supervision/assist for medications management;Supervision due to cognitive status   Equipment Recommendations  Other (comment) (defer)    Recommendations for Other Services      Precautions / Restrictions Precautions Precautions: Fall Recall of Precautions/Restrictions: Impaired Restrictions Weight Bearing Restrictions Per Provider Order: No       Mobility Bed Mobility Overal bed mobility: Needs Assistance Bed Mobility: Supine to Sit     Supine to sit: Min assist, HOB elevated, Used rails     General bed mobility comments: increased time and assistance with trunk    Transfers Overall transfer level: Needs assistance Equipment used: Rolling walker (2 wheels) Transfers: Sit to/from Stand, Bed to chair/wheelchair/BSC Sit to Stand: Min assist      Step pivot transfers: Min assist     General transfer comment: no episodes of knees buckling, min assist to power up and to steady     Balance Overall balance assessment: Needs assistance Sitting-balance support: Single extremity supported, Bilateral upper extremity supported, Feet supported Sitting balance-Leahy Scale: Poor Sitting balance - Comments: supervision on EOB   Standing balance support: Bilateral upper extremity supported, During functional activity, Reliant on assistive device for balance Standing balance-Leahy Scale: Poor Standing balance comment: reliant on UE support                           ADL either performed or assessed with clinical judgement   ADL Overall ADL's : Needs assistance/impaired     Grooming: Wash/dry hands;Wash/dry face;Set up;Sitting   Upper Body Bathing: Minimal assistance;Sitting Upper Body Bathing Details (indicate cue type and reason): assistance for back                           General ADL Comments: declined gown or clothing change stating he was getting a shower this evening and will change after shower    Extremity/Trunk Assessment              Vision       Perception     Praxis     Communication Communication Communication: Impaired;No apparent difficulties   Cognition Arousal: Alert Behavior During Therapy: Flat affect Cognition: Cognition impaired     Awareness: Intellectual awareness intact, Online awareness impaired Memory impairment (select all impairments): Short-term memory     OT - Cognition Comments: patient alert and oriented x4 but does not recall previous therapy session.  Following commands: Impaired Following commands impaired: Follows one step commands inconsistently, Follows one step commands with increased time      Cueing   Cueing Techniques: Verbal cues, Gestural cues, Tactile cues  Exercises      Shoulder Instructions       General  Comments decreased pain and improved cognition on this session    Pertinent Vitals/ Pain       Pain Assessment Pain Assessment: Faces Faces Pain Scale: Hurts a little bit Pain Location: generalized, scrotum Pain Descriptors / Indicators: Discomfort, Grimacing, Guarding Pain Intervention(s): Limited activity within patient's tolerance, Monitored during session, Repositioned, Premedicated before session  Home Living                                          Prior Functioning/Environment              Frequency  Min 2X/week        Progress Toward Goals  OT Goals(current goals can now be found in the care plan section)  Progress towards OT goals: Progressing toward goals  Acute Rehab OT Goals Patient Stated Goal: feel better OT Goal Formulation: With patient Time For Goal Achievement: 01/03/24 Potential to Achieve Goals: Good ADL Goals Pt Will Perform Upper Body Dressing: with set-up;with supervision Pt Will Perform Lower Body Dressing: with min assist Pt Will Transfer to Toilet: with contact guard assist Pt Will Perform Toileting - Clothing Manipulation and hygiene: with set-up;with contact guard assist Additional ADL Goal #1: Pt will be able to follow commands >75% consistently to maximize safety and functional independence with ADLs/transfers  Plan      Co-evaluation                 AM-PAC OT 6 Clicks Daily Activity     Outcome Measure   Help from another person eating meals?: A Little Help from another person taking care of personal grooming?: A Little Help from another person toileting, which includes using toliet, bedpan, or urinal?: A Lot Help from another person bathing (including washing, rinsing, drying)?: A Lot Help from another person to put on and taking off regular upper body clothing?: A Lot Help from another person to put on and taking off regular lower body clothing?: A Lot 6 Click Score: 14    End of Session Equipment  Utilized During Treatment: Gait belt;Rolling walker (2 wheels)  OT Visit Diagnosis: Unsteadiness on feet (R26.81);Other abnormalities of gait and mobility (R26.89);Repeated falls (R29.6);Muscle weakness (generalized) (M62.81);Pain;Other symptoms and signs involving cognitive function Pain - part of body:  (generalized and scrotum)   Activity Tolerance Patient tolerated treatment well   Patient Left in chair;with call bell/phone within reach;with chair alarm set;with family/visitor present   Nurse Communication Mobility status        Time: 8670-8643 OT Time Calculation (min): 27 min  Charges: OT General Charges $OT Visit: 1 Visit OT Treatments $Self Care/Home Management : 8-22 mins $Therapeutic Activity: 8-22 mins  Dick Laine, OTA Acute Rehabilitation Services  Office [phone]   [name] Laine 12/24/2023, 2:17 PM

## 2023-12-24 NOTE — Procedures (Signed)
 PROCEDURE SUMMARY:  Successful US  guided paracentesis from left lateral abdomen.  Yielded 2.2 liters of blood-tinged fluid.  No immediate complications.  Patient tolerated well.  EBL = trace  Specimen sent for labs.  Latavia Goga M Athalia Setterlund PA-C 12/24/2023 4:32 PM

## 2023-12-25 ENCOUNTER — Inpatient Hospital Stay (HOSPITAL_COMMUNITY)

## 2023-12-25 DIAGNOSIS — K652 Spontaneous bacterial peritonitis: Secondary | ICD-10-CM | POA: Diagnosis not present

## 2023-12-25 DIAGNOSIS — M329 Systemic lupus erythematosus, unspecified: Secondary | ICD-10-CM

## 2023-12-25 LAB — CBC
HCT: 29.1 % — ABNORMAL LOW (ref 39.0–52.0)
Hemoglobin: 9.2 g/dL — ABNORMAL LOW (ref 13.0–17.0)
MCH: 27.6 pg (ref 26.0–34.0)
MCHC: 31.6 g/dL (ref 30.0–36.0)
MCV: 87.4 fL (ref 80.0–100.0)
Platelets: 100 K/uL — ABNORMAL LOW (ref 150–400)
RBC: 3.33 MIL/uL — ABNORMAL LOW (ref 4.22–5.81)
RDW: 17.5 % — ABNORMAL HIGH (ref 11.5–15.5)
WBC: 5.8 K/uL (ref 4.0–10.5)
nRBC: 0 % (ref 0.0–0.2)

## 2023-12-25 LAB — RENAL FUNCTION PANEL
Albumin: 1.5 g/dL — ABNORMAL LOW (ref 3.5–5.0)
Anion gap: 10 (ref 5–15)
BUN: 53 mg/dL — ABNORMAL HIGH (ref 6–20)
CO2: 26 mmol/L (ref 22–32)
Calcium: 7.7 mg/dL — ABNORMAL LOW (ref 8.9–10.3)
Chloride: 92 mmol/L — ABNORMAL LOW (ref 98–111)
Creatinine, Ser: 6.04 mg/dL — ABNORMAL HIGH (ref 0.61–1.24)
GFR, Estimated: 10 mL/min — ABNORMAL LOW (ref >60)
Glucose, Bld: 120 mg/dL — ABNORMAL HIGH (ref 70–99)
Phosphorus: 6.2 mg/dL — ABNORMAL HIGH (ref 2.5–4.6)
Potassium: 5.2 mmol/L — ABNORMAL HIGH (ref 3.5–5.1)
Sodium: 128 mmol/L — ABNORMAL LOW (ref 135–145)

## 2023-12-25 MED ORDER — LIDOCAINE-PRILOCAINE 2.5-2.5 % EX CREA: 1.0000 | TOPICAL_CREAM | CUTANEOUS | Status: DC | PRN

## 2023-12-25 MED ORDER — ALTEPLASE 2 MG IJ SOLR: 2.0000 mg | Freq: Once | INTRAMUSCULAR | Status: DC | PRN

## 2023-12-25 MED ORDER — DOXERCALCIFEROL 4 MCG/2ML IV SOLN
2.0000 ug | INTRAVENOUS | Status: DC
  Administered 2023-12-25 – 2024-01-01 (×3): 2 ug via INTRAVENOUS
  Filled 2023-12-25 (×3): qty 2

## 2023-12-25 MED ORDER — NEPRO/CARBSTEADY PO LIQD
237.0000 mL | ORAL | Status: DC
  Administered 2023-12-25 – 2023-12-26 (×2): 237 mL via ORAL

## 2023-12-25 MED ORDER — IOHEXOL 350 MG/ML SOLN
75.0000 mL | Freq: Once | INTRAVENOUS | Status: AC | PRN
  Administered 2023-12-25: 75 mL via INTRAVENOUS

## 2023-12-25 MED ORDER — RENA-VITE PO TABS
1.0000 | ORAL_TABLET | Freq: Every day | ORAL | Status: DC
  Administered 2023-12-25 – 2023-12-29 (×5): 1 via ORAL
  Filled 2023-12-25 (×7): qty 1

## 2023-12-25 MED ORDER — HEPARIN SODIUM (PORCINE) 1000 UNIT/ML DIALYSIS: 1000.0000 [IU] | INTRAMUSCULAR | Status: DC | PRN

## 2023-12-25 MED ORDER — DOXERCALCIFEROL 4 MCG/2ML IV SOLN
INTRAVENOUS | Status: AC
Start: 2023-12-25 — End: 2023-12-25
  Filled 2023-12-25: qty 2

## 2023-12-25 MED ORDER — PENTAFLUOROPROP-TETRAFLUOROETH EX AERO: 1.0000 | INHALATION_SPRAY | CUTANEOUS | Status: DC | PRN

## 2023-12-25 MED ORDER — PROSOURCE PLUS PO LIQD: 30.0000 mL | Freq: Two times a day (BID) | ORAL | Status: DC

## 2023-12-25 MED ORDER — ANTICOAGULANT SODIUM CITRATE 4% (200MG/5ML) IV SOLN
5.0000 mL | Status: DC | PRN
Start: 2023-12-25 — End: 2023-12-25
  Filled 2023-12-25: qty 5

## 2023-12-25 MED ORDER — LIDOCAINE HCL (PF) 1 % IJ SOLN: 5.0000 mL | INTRAMUSCULAR | Status: DC | PRN

## 2023-12-25 MED ORDER — PENTAFLUOROPROP-TETRAFLUOROETH EX AERO
INHALATION_SPRAY | CUTANEOUS | Status: AC
  Filled 2023-12-25: qty 30

## 2023-12-25 NOTE — Consult Note (Signed)
 Date of Admission:  12/14/2023          Reason for Consult: Peritonitis that seems worse by cell count    Referring Provider: Delon Herald, MD   Assessment:  SBP on admission by labs and cultures but now with worsening Cell count and differential and LDH on repeat paracentesis that the patient clinically seems much improved ESRD on HD SLE with flare during this admission that responded to prednisone  and Plaquenil  Encephalopathy which has improved. He is adamant this was not due to etoh withdrawal Hx of night sweats going back several years that prompted sister's concerns for TB Ttpenia  Plan:  Continue cefazolin  for now (see discussion below) Follow-up culture data  CT abdomen I have ordered CT chest as well I would recommend getting another paracentesis, preferably when has   lot of fluid to collect and sending for repeat cell count, diff, fluid and sending ALL of the remaining fluid (liters if possible) to micro for them to use (likely spin down) and send for AFB stain and culture AFB culture with MTB NAAT Also send fluid for Adenosine De-aminase (let me know when and if he goes for this and I can put the TB labs in Epic Standard precautions at present  Principal Problem:   Spontaneous bacterial peritonitis (HCC) Active Problems:   Systemic lupus erythematosus with lung involvement (HCC)   Pancytopenia (HCC)   Chronic systolic CHF (congestive heart failure) (HCC)   Acquired hypothyroidism   ESRD on dialysis (HCC)   Ascites   Essential hypertension   Hyperkalemia   Scheduled Meds:  (feeding supplement) PROSource Plus  30 mL Oral BID BM   carvedilol   25 mg Oral BID   Chlorhexidine  Gluconate Cloth  6 each Topical Q0600   docusate sodium   100 mg Oral BID   doxercalciferol   2 mcg Intravenous Q T,Th,Sa-HD   folic acid   1 mg Oral Daily   hydroxychloroquine   200 mg Oral BID   levothyroxine   50 mcg Oral QAC breakfast   lidocaine -EPINEPHrine   10 mL Intradermal  Once   lidocaine -EPINEPHrine   30 mL Infiltration Once   multivitamin with minerals  1 tablet Oral Daily   polyethylene glycol  17 g Oral Daily   predniSONE   15 mg Oral Q breakfast   thiamine   100 mg Oral Daily   Or   thiamine   100 mg Intravenous Daily   Continuous Infusions:  anticoagulant sodium citrate       ceFAZolin  (ANCEF ) IV 1 g (12/24/23 1244)   PRN Meds:.acetaminophen  **OR** [DISCONTINUED] acetaminophen , albuterol , alteplase , anticoagulant sodium citrate , bisacodyl , bisacodyl , heparin , lidocaine  (PF), lidocaine -prilocaine , melatonin, menthol -cetylpyridinium, ondansetron  **OR** ondansetron  (ZOFRAN ) IV, mouth rinse, oxyCODONE , pentafluoroprop-tetrafluoroeth  HPI: [name] is a 53 y.o. male with complicated medical history including end-stage renal disease on hemodialysis lupus who has had pleural effusions going back to 2016 and ascites going back to 2022 in terms of times that it is they have been aspirated.  He was admitted now roughly 11 days ago with normal pain and weakness.  He underwent bedside paracentesis in the ER with cell count showing 3650 blood cells with 95% neutrophils.  Gram stain showed abundant white blood cells with gram-negative rods and culture ultimately was positive for Klebsiella pneumonia which was fairly sensitive to antibiotics based on lab report.  The abdomen pelvis done admission showed some normal hepatic density normal spleen mild wall bowel wall thickening and diffuse colonic wall thickening and some diverticulosis  And developed  acute encephalopathy on the 22nd with anxiety delirium and paranoia hallucinations he was treated for alcohol withdrawal though when I talk to the patient today he tells me that he has not drank any alcohol for 3 weeks at least.  He says he used to drink to self-medicate when his lupus was not well-controlled.  He also has been found to have a lupus flare and has been started on Plaquenil  and prednisone .  He  clinically feels dramatically better now and is clear on exam when I interviewed him and also talk to his sister on his cell phone.  He did underwent go repeat paracentesis to see how he is SBP was doing.  Curiously repeat paracentesis showed 10,425 nucleated cells there were 99% neutrophils.  His LDH also increased from 60 to greater than 2500.  Gram stain from this culture has been negative and cultures are incubating.  He was initially on ceftriaxone  and then narrowed to cefazolin  based on sensitivity data.  He has repeat CT scan pending today and this is prudent in case he has an intra-abdominal infection that is seeding his ascites.  I think there several things to consider here.  It is possible that the Klebsiella pneumonia could have harbored an AmpC gene which would have conferred resistance to cephalosporins and emerged on therapy.  If that is the case though we should see the Klebsiella grow on culture again.  Until we get more data from the culture I would keep him on cefazolin .  There are things to consider about why his white cell count is so high would be consider alternative diagnoses.  In talking to the sister she has had concerns for tuberculosis.  In the (she has a background as an ICU nurse infection prevention nurse and experience in public health) and she had concerns for possible tuberculosis going back several years when [name] would have fever chills and night sweats.  He apparently still has these intermittently but not on a consistent basis.  He has told me that he has gone several years since the first paracentesis before he needed another 1 and certainly has not we see in terms of when we have cell counts in the computer but his sister says he has required more frequent paracenteses.  Only would be reasonable when he has the next paracentesis to send a very large volume to the microbiology lab and have them process all of this fluid and send it for AFB culture--not  swab it and do so.  I will also order a CT chest.  Have cytology sent on the specimen and prior cytologies have been negative for malignancy.  Lupus itself can cause peritonitis and perhaps his lupus flare during this hospitalization is causing the abnormalities in peritoneum.  Other potential confouders re the cell count could be Lewis shifts with hemodialysis with potential hemoconcentration but also potential concentration of pleural fluid or the reverse dilution.  The LDH though is markedly elevated and I do not know why that would be happening.  Certainly prednisone  also can cause peripheral leukocytosis and leukocytosis at other sites though his white blood cell on CBC has not changed dramatically in the last 4 days it is up versus earlier this admission where it dropped as low as 2.9  Note there is been question of cirrhosis but I am told by the sister that he underwent a liver biopsy which found no evidence of cirrhosis and imaging certainly does not suggest that nor does GI believe that he has  cirrhosis.    I have personally spent 86 minutes involved in face-to-face and non-face-to-face activities for this patient on the day of the visit. Professional time spent includes the following activities: Preparing to see the patient (review of tests), Obtaining and/or reviewing separately obtained history (admission/discharge record), Performing a medically appropriate examination and/or evaluation , Ordering medications/tests/procedures, referring and communicating with other health care professionals, Documenting clinical information in the EMR, Independently interpreting results (not separately reported), Communicating results to the patient/family/caregiver, Counseling and educating the patient/family/caregiver and Care coordination (not separately reported).   Evaluation of the patient requires complex antimicrobial therapy evaluation, counseling , isolation needs to reduce disease  transmission and risk assessment and mitigation.     Review of Systems: Review of Systems  Constitutional:  Positive for malaise/fatigue and weight loss. Negative for chills and fever.  HENT:  Negative for congestion and sore throat.   Eyes:  Negative for blurred vision and photophobia.  Respiratory:  Positive for shortness of breath. Negative for cough and wheezing.   Cardiovascular:  Negative for chest pain, palpitations and leg swelling.  Gastrointestinal:  Positive for abdominal pain. Negative for blood in stool, constipation, diarrhea, heartburn, melena, nausea and vomiting.  Genitourinary:  Negative for dysuria, flank pain and hematuria.  Musculoskeletal:  Negative for back pain, falls, joint pain and myalgias.  Skin:  Negative for itching and rash.  Neurological:  Positive for weakness. Negative for dizziness, focal weakness, loss of consciousness and headaches.  Endo/Heme/Allergies:  Does not bruise/bleed easily.  Psychiatric/Behavioral:  Negative for depression and suicidal ideas. The patient does not have insomnia.     Past Medical History:  Diagnosis Date   ESRD on hemodialysis (HCC)    TTS at [facility])   Lupus    Thyroid  disease     Social History   Tobacco Use   Smoking status: Never   Smokeless tobacco: Never  Vaping Use   Vaping status: Never Used  Substance Use Topics   Alcohol use: Yes    Comment: 2-4 glasses of wine 2-3 times weekly   Drug use: No    Family History  Problem Relation Age of Onset   Cancer Mother    No Known Allergies  OBJECTIVE: Blood pressure 107/69, pulse 65, temperature (!) 97.4 F (36.3 C), resp. rate 10, height 5' 11 (1.803 m), weight 68.7 kg, SpO2 97%.  Physical Exam Constitutional:      Appearance: He is underweight.     Comments: Facial wasting  HENT:     Head: Normocephalic and atraumatic.  Eyes:     Conjunctiva/sclera: Conjunctivae normal.  Cardiovascular:     Rate and Rhythm: Normal rate and regular  rhythm.     Heart sounds: No murmur heard.    No friction rub. No gallop.  Pulmonary:     Effort: Pulmonary effort is normal. No respiratory distress.     Breath sounds: No stridor. No wheezing or rhonchi.  Abdominal:     General: There is distension.     Palpations: Abdomen is soft.     Tenderness: There is abdominal tenderness.  Musculoskeletal:        General: No tenderness. Normal range of motion.     Cervical back: Normal range of motion and neck supple.  Skin:    General: Skin is warm and dry.     Coloration: Skin is not pale.     Findings: No erythema or rash.  Neurological:     General: No focal deficit present.  Mental Status: He is alert and oriented to person, place, and time.  Psychiatric:        Mood and Affect: Mood normal.        Behavior: Behavior normal.        Thought Content: Thought content normal.        Judgment: Judgment normal.    Receiving HD  Lab Results Lab Results  Component Value Date   WBC 5.8 12/25/2023   HGB 9.2 (L) 12/25/2023   HCT 29.1 (L) 12/25/2023   MCV 87.4 12/25/2023   PLT 100 (L) 12/25/2023    Lab Results  Component Value Date   CREATININE 6.04 (H) 12/25/2023   BUN 53 (H) 12/25/2023   NA 128 (L) 12/25/2023   K 5.2 (H) 12/25/2023   CL 92 (L) 12/25/2023   CO2 26 12/25/2023    Lab Results  Component Value Date   ALT 10 12/24/2023   AST 28 12/24/2023   ALKPHOS 89 12/24/2023   BILITOT 0.6 12/24/2023     Microbiology: Recent Results (from the past 240 hours)  Body fluid culture w Gram Stain     Status: None (Preliminary result)   Collection Time: 12/24/23 12:35 PM   Specimen: Abdomen; Peritoneal Fluid  Result Value Ref Range Status   Specimen Description PERITONEAL  Final   Special Requests NONE  Final   Gram Stain   Final    RARE WBC PRESENT,BOTH PMN AND MONONUCLEAR NO ORGANISMS SEEN    Culture   Final    NO GROWTH < 24 HOURS Performed at [facility], [address], [place], [place] [address]     Report Status PENDING  Incomplete    Jomarie Fleeta Rothman, MD [facility] [phone] pager  12/25/2023, 10:43 AM

## 2023-12-25 NOTE — Progress Notes (Signed)
 Subjective: No complaints.  Feeling well.  Objective: Vital signs in last 24 hours: Temp:  [97.4 F (36.3 C)-98.3 F (36.8 C)] 97.8 F (36.6 C) (08/30 1628) Pulse Rate:  [60-66] 66 (08/30 1628) Resp:  [10-21] 16 (08/30 1628) BP: (99-142)/(65-84) 117/74 (08/30 1628) SpO2:  [96 %-100 %] 97 % (08/30 1628) Weight:  [67.7 kg-68.7 kg] 67.7 kg (08/30 1224) Last BM Date : 12/21/23  Intake/Output from previous day: 08/29 0701 - 08/30 0700 In: 1300 [P.O.:1300] Out: 0  Intake/Output this shift: Total I/O In: -  Out: 1000 [Other:1000]  General appearance: alert and no distress GI: soft, + ascites  Lab Results: Recent Labs    12/23/23 0605 12/24/23 0414 12/25/23 0853  WBC 8.5 6.5 5.8  HGB 10.9* 10.4* 9.2*  HCT 33.9* 32.6* 29.1*  PLT 92* 88* 100*   BMET Recent Labs    12/23/23 0605 12/24/23 0414 12/25/23 0854  NA 130* 130* 128*  K 4.9 4.8 5.2*  CL 94* 92* 92*  CO2 26 28 26   GLUCOSE 81 149* 120*  BUN 46* 30* 53*  CREATININE 6.56* 4.60* 6.04*  CALCIUM  7.7* 7.8* 7.7*   LFT Recent Labs    12/24/23 0414 12/25/23 0854  PROT 6.6  --   ALBUMIN  1.6* 1.5*  AST 28  --   ALT 10  --   ALKPHOS 89  --   BILITOT 0.6  --    PT/INR Recent Labs    12/23/23 0605  LABPROT 16.2*  INR 1.2   Hepatitis Panel No results for input(s): HEPBSAG, HCVAB, HEPAIGM, HEPBIGM in the last 72 hours. C-Diff No results for input(s): CDIFFTOX in the last 72 hours. Fecal Lactopherrin No results for input(s): FECLLACTOFRN in the last 72 hours.  Studies/Results: CT ABDOMEN PELVIS W CONTRAST Result Date: 12/25/2023 CLINICAL DATA:  Provided history: Ascites, fever of unknown origin. EXAM: CT CHEST, ABDOMEN, AND PELVIS WITH CONTRAST TECHNIQUE: Multidetector CT imaging of the chest, abdomen and pelvis was performed following the standard protocol during bolus administration of intravenous contrast. RADIATION DOSE REDUCTION: This exam was performed according to the departmental  dose-optimization program which includes automated exposure control, adjustment of the mA and/or kV according to patient size and/or use of iterative reconstruction technique. CONTRAST:  75mL OMNIPAQUE  IOHEXOL  350 MG/ML SOLN COMPARISON:  Abdominopelvic CT 12/14/2023 FINDINGS: CT CHEST FINDINGS Cardiovascular: The heart is enlarged. Coronary artery and mitral annulus calcifications. Trace pericardial effusion. Mild aortic atherosclerosis without aneurysm or acute aortic findings. Main pulmonary artery is dilated at 3.8 cm. No obvious central pulmonary embolus on this exam not tailored to pulmonary artery assessment. Dilated vasculature in the left upper extremity, likely related to dialysis access. Mediastinum/Nodes: No mediastinal, hilar, or axillary adenopathy. No visible thyroid  nodule. Mild wall thickening of the distal esophagus. Lungs/Pleura: Mild biapical pleuroparenchymal scarring. Trace dependent subsegmental atelectasis in the lower lobes. Trace left pleural effusion. No confluent airspace disease or evidence of pneumonia. No features of pulmonary edema. No endobronchial lesion or debris. Musculoskeletal: There are no acute or suspicious osseous abnormalities. CT ABDOMEN PELVIS FINDINGS Hepatobiliary: No focal liver lesion. Question of subtle capsular nodularity of the liver. Layering density in the gallbladder was not seen on prior exam and may represent vicarious excretion of contrast. No biliary dilatation. Pancreas: No ductal dilatation or inflammation. Spleen: Mildly enlarged, 13.8 cm AP. Heterogeneous enhancement felt to be due to phase of contrast. Adrenals/Urinary Tract: No adrenal nodule. Bilateral renal parenchymal atrophy consistent with chronic renal disease. No hydronephrosis or suspicious renal lesion. Absent  renal excretion again demonstrated on delayed phase. The urinary bladder is decompressed. Stomach/Bowel: Detailed bowel assessment is limited in the absence of enteric contrast and  presence of ascites. Wall thickening of the distal esophagus. Equivocal pre pyloric gastric wall thickening. Diffuse small bowel wall thickening which is nonspecific in the setting of ascites. There is scattered fluid-filled and mildly dilated loops of small bowel. Normal appendix. Moderate formed stool in the colon. Areas of colonic wall thickening involving the ascending and transverse colon. Left colonic diverticulosis without focal diverticulitis. No bowel pneumatosis. Vascular/Lymphatic: Aortic atherosclerosis without aneurysm. Patent portal vein. Tortuous splenic vein likely portal hypertension. Scattered small retroperitoneal nodes are unchanged over the last 11 days. Reproductive: Prostate is unremarkable.  Right scrotal hydrocele. Other: Moderate volume ascites again demonstrated. There is increasing peritoneal thickening and enhancement about ascites particularly in the right abdomen and pelvis. Moderate to large volume ascites with enhancement tracks into a right inguinal hernia extending into the scrotum. There is generalized edema and soft tissue thickening of the omentum. No free intra-abdominal air. Musculoskeletal: There are no acute or suspicious osseous abnormalities. Chronic bilateral femoral head avascular necrosis. IMPRESSION: 1. Moderate volume ascites. Increasing peritoneal thickening and enhancement about ascites particularly in the right abdomen and pelvis. Findings are suspicious for peritonitis. Generalized edema and soft tissue thickening of the omentum, nonspecific in the setting of ascites. 2. Ascites with enhancement extends into a right inguinal hernia and into the scrotum. 3. Areas of colonic and small bowel wall thickening, nonspecific in the setting of ascites. 4. Wall thickening of the distal esophagus, can be seen with reflux or esophagitis. 5. Cardiomegaly with coronary artery calcifications. 6. Dilated main pulmonary artery, can be seen with pulmonary arterial hypertension. 7.  Trace left pleural effusion. 8. Question of subtle capsular nodularity of the liver, can be seen with cirrhosis. Mild splenomegaly and tortuous splenic vein likely portal hypertension. Aortic Atherosclerosis (ICD10-I70.0). Electronically Signed   By: Andrea Gasman M.D.   On: 12/25/2023 15:57   CT CHEST W CONTRAST Result Date: 12/25/2023 CLINICAL DATA:  Provided history: Ascites, fever of unknown origin. EXAM: CT CHEST, ABDOMEN, AND PELVIS WITH CONTRAST TECHNIQUE: Multidetector CT imaging of the chest, abdomen and pelvis was performed following the standard protocol during bolus administration of intravenous contrast. RADIATION DOSE REDUCTION: This exam was performed according to the departmental dose-optimization program which includes automated exposure control, adjustment of the mA and/or kV according to patient size and/or use of iterative reconstruction technique. CONTRAST:  75mL OMNIPAQUE  IOHEXOL  350 MG/ML SOLN COMPARISON:  Abdominopelvic CT 12/14/2023 FINDINGS: CT CHEST FINDINGS Cardiovascular: The heart is enlarged. Coronary artery and mitral annulus calcifications. Trace pericardial effusion. Mild aortic atherosclerosis without aneurysm or acute aortic findings. Main pulmonary artery is dilated at 3.8 cm. No obvious central pulmonary embolus on this exam not tailored to pulmonary artery assessment. Dilated vasculature in the left upper extremity, likely related to dialysis access. Mediastinum/Nodes: No mediastinal, hilar, or axillary adenopathy. No visible thyroid  nodule. Mild wall thickening of the distal esophagus. Lungs/Pleura: Mild biapical pleuroparenchymal scarring. Trace dependent subsegmental atelectasis in the lower lobes. Trace left pleural effusion. No confluent airspace disease or evidence of pneumonia. No features of pulmonary edema. No endobronchial lesion or debris. Musculoskeletal: There are no acute or suspicious osseous abnormalities. CT ABDOMEN PELVIS FINDINGS Hepatobiliary: No focal  liver lesion. Question of subtle capsular nodularity of the liver. Layering density in the gallbladder was not seen on prior exam and may represent vicarious excretion of contrast. No biliary dilatation. Pancreas:  No ductal dilatation or inflammation. Spleen: Mildly enlarged, 13.8 cm AP. Heterogeneous enhancement felt to be due to phase of contrast. Adrenals/Urinary Tract: No adrenal nodule. Bilateral renal parenchymal atrophy consistent with chronic renal disease. No hydronephrosis or suspicious renal lesion. Absent renal excretion again demonstrated on delayed phase. The urinary bladder is decompressed. Stomach/Bowel: Detailed bowel assessment is limited in the absence of enteric contrast and presence of ascites. Wall thickening of the distal esophagus. Equivocal pre pyloric gastric wall thickening. Diffuse small bowel wall thickening which is nonspecific in the setting of ascites. There is scattered fluid-filled and mildly dilated loops of small bowel. Normal appendix. Moderate formed stool in the colon. Areas of colonic wall thickening involving the ascending and transverse colon. Left colonic diverticulosis without focal diverticulitis. No bowel pneumatosis. Vascular/Lymphatic: Aortic atherosclerosis without aneurysm. Patent portal vein. Tortuous splenic vein likely portal hypertension. Scattered small retroperitoneal nodes are unchanged over the last 11 days. Reproductive: Prostate is unremarkable.  Right scrotal hydrocele. Other: Moderate volume ascites again demonstrated. There is increasing peritoneal thickening and enhancement about ascites particularly in the right abdomen and pelvis. Moderate to large volume ascites with enhancement tracks into a right inguinal hernia extending into the scrotum. There is generalized edema and soft tissue thickening of the omentum. No free intra-abdominal air. Musculoskeletal: There are no acute or suspicious osseous abnormalities. Chronic bilateral femoral head avascular  necrosis. IMPRESSION: 1. Moderate volume ascites. Increasing peritoneal thickening and enhancement about ascites particularly in the right abdomen and pelvis. Findings are suspicious for peritonitis. Generalized edema and soft tissue thickening of the omentum, nonspecific in the setting of ascites. 2. Ascites with enhancement extends into a right inguinal hernia and into the scrotum. 3. Areas of colonic and small bowel wall thickening, nonspecific in the setting of ascites. 4. Wall thickening of the distal esophagus, can be seen with reflux or esophagitis. 5. Cardiomegaly with coronary artery calcifications. 6. Dilated main pulmonary artery, can be seen with pulmonary arterial hypertension. 7. Trace left pleural effusion. 8. Question of subtle capsular nodularity of the liver, can be seen with cirrhosis. Mild splenomegaly and tortuous splenic vein likely portal hypertension. Aortic Atherosclerosis (ICD10-I70.0). Electronically Signed   By: Andrea Gasman M.D.   On: 12/25/2023 15:57    Medications: Scheduled:  carvedilol   25 mg Oral BID   Chlorhexidine  Gluconate Cloth  6 each Topical Q0600   docusate sodium   100 mg Oral BID   doxercalciferol   2 mcg Intravenous Q T,Th,Sa-HD   feeding supplement (NEPRO CARB STEADY)  237 mL Oral Q24H   folic acid   1 mg Oral Daily   hydroxychloroquine   200 mg Oral BID   levothyroxine   50 mcg Oral QAC breakfast   lidocaine -EPINEPHrine   10 mL Intradermal Once   lidocaine -EPINEPHrine   30 mL Infiltration Once   multivitamin  1 tablet Oral QHS   polyethylene glycol  17 g Oral Daily   predniSONE   15 mg Oral Q breakfast   thiamine   100 mg Oral Daily   Or   thiamine   100 mg Intravenous Daily   Continuous:   ceFAZolin  (ANCEF ) IV 1 g (12/25/23 1503)    Assessment/Plan: 1) SBP - K pneumonia 2) Possible ETOH cirrhosis (FIB-4 5.43) 3) Ascites. 4) ESRD.   The patient reports feeling well.  He was evaluated by ID and the plan is to repeat the paracentesis when he is  more uncomfortable.  The goal is to capture as much ascites to check for TB.  As for the etiology of his ascites,  it is difficult to calculate the SAAG as the fluid albumin  is only reported as <1.5.  There is a history of ETOH and his FIB-4 is elevated, which is consistent with significant fibrosis.  There is a possibility of capsular nodularity with the CT scan performed today.  An elastography will be beneficial, but it will not alter the current management.  Plan: 1) Repeat paracentesis when he reaccumulates ascites. 2) Monitor for signs of withdrawal. 3) Continue with cefazolin  per ID.  LOS: 11 days   Noble Cicalese D 12/25/2023, 5:02 PM

## 2023-12-25 NOTE — Progress Notes (Signed)
 Initial Nutrition Assessment  DOCUMENTATION CODES:   Not applicable  INTERVENTION:  - Renal diet with fluid restriction per MD.  - Nepro Shake po daily, p provides 425 kcal and 19 grams protein - Encourage intake of small and frequent meals to combat early satiety from ascites. - D/c MVI and add Rena-vit.  - Monitor weight trends.   NUTRITION DIAGNOSIS:   Increased nutrient needs related to chronic illness as evidenced by estimated needs.  GOAL:   Patient will meet greater than or equal to 90% of their needs  MONITOR:   PO intake, Supplement acceptance, Labs, Weight trends  REASON FOR ASSESSMENT:   Consult Assessment of nutrition requirement/status  ASSESSMENT:   53 y.o. male with PMH ESRD on TTS HD, SLE, ascites without diagnosis of cirrhosis, and chronic HFrEF who presented with abdominal pain and admitted for spontaneous bacterial peritonitis.  8/19 Admit 8/20 IR paracentesis with 1.8L removed 8/26 repeat paracentesis with removed   RD working remotely. Called patient via bedside telephone to obtain nutrition history but unable to reach patient.  Per chart review, no weight history in the past year to assess recent changes. Weights this admission have varied from 140# to 168#. Accurate weight status difficult to assess due to ongoing ascites/edema and HD.  PO intake noted to have been poor at the beginning of admission and was often refusing meals. However, patient has now been documented to be consuming 100% of meals the past 2 days.  Nephrology added ProSource Plus BID starting today. Will adjust supplements to Nepro BID to better support increased kcal and protein needs.   Admit weight: 168# Current weight: 149# I&O's: - since admit  Medications reviewed and include: Colace, Miralax , 1mg  folic acid , MVI, 100mg  thiamine   Labs reviewed:  Na 128 K+ 5.2 Creatinine 6.04 Phosphorus 6.2   NUTRITION - FOCUSED PHYSICAL EXAM:  RD working  remotely  Diet Order:   Diet Order             Diet renal with fluid restriction Fluid restriction: 1500 mL Fluid; Room service appropriate? Yes; Fluid consistency: Thin  Diet effective now                   EDUCATION NEEDS:  No education needs have been identified at this time  Skin:  Skin Assessment: Reviewed RN Assessment  Last BM:  8/26  Height:  Ht Readings from Last 1 Encounters:  12/14/23 5' 11 (1.803 m)   Weight:  Wt Readings from Last 1 Encounters:  12/25/23 67.7 kg   Ideal Body Weight:  78.18 kg  BMI:  Body mass index is 20.82 kg/m.  Estimated Nutritional Needs:  Kcal:  2000-2250 kcals Protein:  90-100 grams Fluid:  + UOP    Trude Ned RD, LDN Contact via Secure Chat.

## 2023-12-25 NOTE — Progress Notes (Signed)
 [facility] Progress Note   Subjective:  Seen on HD, 1L UFG and tolerating so far. No CP/dyspnea. Abd starting to feel better.  Objective Vitals:   12/24/23 0902 12/24/23 1704 12/24/23 2146 12/25/23 0429  BP: 116/70 117/80 119/82 (!) 142/78  Pulse: 68 66 60 65  Resp: 19 19 20 18   Temp: 98.4 F (36.9 C) 98.3 F (36.8 C) (!) 97.4 F (36.3 C) 97.6 F (36.4 C)  TempSrc:  Oral Oral   SpO2: 98% 97% 96% 99%  Weight:      Height:       Physical Exam General: Chronically ill appearing, NAD. Room air Heart: RRR; no murmur Lungs: CTA anteriorly Abdomen: distended, mild TTP Extremities: trace BLE edema Dialysis Access: LUE AVF +t/b   Additional Objective Labs: Basic Metabolic Panel: Recent Labs  Lab 12/19/23 0834 12/20/23 0542 12/21/23 0509 12/22/23 0441 12/23/23 0605 12/24/23 0414  NA 133* 129*   < > 130* 130* 130*  K 4.1 3.9   < > 4.2 4.9 4.8  CL 95* 96*   < > 94* 94* 92*  CO2 25 26   < > 26 26 28   GLUCOSE 86 137*   < > 129* 81 149*  BUN 33* 45*   < > 34* 46* 30*  CREATININE 5.32* 6.28*   < > 5.03* 6.56* 4.60*  CALCIUM  7.9* 7.7*   < > 7.6* 7.7* 7.8*  PHOS 4.3 4.0  --  4.6  --   --    < > = values in this interval not displayed.   Liver Function Tests: Recent Labs  Lab 12/20/23 0542 12/23/23 0605 12/24/23 0414  AST 31 29 28   ALT 15 11 10   ALKPHOS 49 75 89  BILITOT 0.5 0.7 0.6  PROT 6.0* 6.3* 6.6  ALBUMIN  <1.5* <1.5* 1.6*   CBC: Recent Labs  Lab 12/20/23 0542 12/21/23 0509 12/22/23 0441 12/23/23 0605 12/24/23 0414  WBC 2.9* 5.3 8.0 8.5 6.5  NEUTROABS  --   --   --  6.4 5.3  HGB 9.9* 9.9* 10.6* 10.9* 10.4*  HCT 30.9* 31.0* 33.4* 33.9* 32.6*  MCV 87.8 86.8 87.7 86.9 87.6  PLT 91* 107* 86* 92* 88*   Medications:  anticoagulant sodium citrate       ceFAZolin  (ANCEF ) IV 1 g (12/24/23 1244)    carvedilol   25 mg Oral BID   Chlorhexidine  Gluconate Cloth  6 each Topical Q0600   docusate sodium   100 mg Oral BID   folic acid   1 mg Oral  Daily   hydroxychloroquine   200 mg Oral BID   levothyroxine   50 mcg Oral QAC breakfast   lidocaine -EPINEPHrine   10 mL Intradermal Once   lidocaine -EPINEPHrine   30 mL Infiltration Once   multivitamin with minerals  1 tablet Oral Daily   polyethylene glycol  17 g Oral Daily   predniSONE   15 mg Oral Q breakfast   thiamine   100 mg Oral Daily   Or   thiamine   100 mg Intravenous Daily   Dialysis Orders TTS - AF 3:45hr, 400/A1.5, EDW 71kg, 2K/2C bath. AVF, no heparin  - Mircera 150mcg IV q 2 weeks (last 8/12) - Hectoral 2mcg IV q HD  Assessment/Plan: SBP: Fluid Cx + Klebsiella, on course of Cefazolin  with improved symptoms. AMS: C/w alcohol withdrawal, per primary. ESRD: Continue HD on TTS schedule - HD now, 1L UFG. HTN/volume: BP stable now - ARB and hydralazine d/c'd - remains on BB, UF as tolerated. Anemia of ESRD: Hgb 10.4 - follow  without ESA for now. Secondary HPTH: CorrCa/Phos ok - resume hectoral q HD, not getting binders here Nutrition: Alb very low, adding protein supps. SLE: C3/4 low, c/w flare - on plaquenil  and prednisone  taper - feels better. Recurrent ascites: Gets intermittent paracenteses since 2022.   Izetta Boehringer, PA-C 12/25/2023, 8:40 AM  [employer]

## 2023-12-25 NOTE — Progress Notes (Signed)
 Progress Note   Patient: [name] FMW:[id number] DOB: 1970-11-09 DOA: 12/14/2023     11 DOS: the patient was seen and examined on 12/25/2023   Brief hospital course: 53yo with h/o ESRD on TTS HD, SLE, ascites without diagnosis of cirrhosis, and chronic HFrEF who presented on 8/19 with abdominal pain.  Last paracentesis was in 09/2023.  Bedside paracentesis performed with high concern for SBP, cultures positive for Klebsiella, treated with Ceftriaxone  -> Cefazolin  x 14 days.  IR performed paracentesis on 8/20 with 1.8L removed and repeat on 8/26 with 700 cc removed; etiology is thought to be related to severely low albumin .  Developed severe acute metabolic encephalopathy from alcohol withdrawal which has since improved.  Repeat paracentesis ordered.  GI is consulting.  He is recommended for SNF rehab.  Assessment and Plan:  Spontaneous bacterial peritonitis Patient with known h/o ascites but no documented cirrhosis (see below) Initial peritoneal fluid collection noting greater than 3600 WBCs, 95% neutrophils Culture with Klebsiella Transition ceftriaxone  to cefazolin  for total of 14 days   SBP prophylaxis should be continued for those with h/o SBP with either Bactrim  DS or Ciprofloxacin  Repeat paracentesis on 8/26 was not sent for analysis Repeat paracentesis ordered with studies to include cell count/diff, culture, cytology, protein, albumin  Repeat paracentesis fluid appears significantly worse than prior, despite antibiotics; this is incongruous with his clinical presentation, which appears to be significantly improved Will order CT A/P for further evaluation ID consulted and has also added a CT chest He is recommended to have another paracentesis once he has a large amount of fluid, and having all of the fluid (liters, if possible) sent for AFB stage and culture with MTB NAAT as well as adenosine de-aminase   Acute metabolic encephalopathy Likely related to alcohol  withdrawal Beginning 8/22 (3 days after presentation) patient appeared very anxious, delirious, paranoid, even hallucinating Discussion with sister - patient does drink to cover pain from his lupus, has reduced his alcohol consumption, reports drinking 1.5 pints of alcohol at a time periodically but denies daily drinking Currently on CIWA protocol Will be very sparing with any sedative type medications  Appears to be improving daily and basically back to baseline    Large volume ascites IR paracentesis performed 8/20 with 1.8 L removed Patient having worsening abdominal tightness and tenderness Ordered repeat therapeutic paracentesis 8/26, yielding only Sister reports prior liver biopsy ruling out cirrhosis about 2-3 years ago and performed here but I am not able to appreciate any pathology results from prior liver biopsy done in our system at this time Given his h/o SLE in addition to ETOH consumption, cirrhosis remains probable in the setting of recurrent ascites CT is not suggestive of cirrhosis, although there is moderate to large volume ascites GI consulting   Systemic lupus erythematosus with lung involvement Sister is concerned that his presentation is related to SLE Dr. Melia notes that lupus flares are less common in HD patients but still possible Treating with Plaquenil  200 mg PO BID and prednisone  20 mg daily and he is showing improvement He has an ongoing need to follow-up closely as lupus appears to be underlying cause of many of his comorbidities both directly and indirectly Nephrology recommends continuing treatment with Plaquenil  200 mg BID for at least 2 weeks and then decrease to 1 tablet daily for 2 weeks, monitoring for any signs of recurrent activity Nephrology is slowly tapering the steroids and has decreased from 20 mg daily to 15 mg as of  today   Abdominal pain Possibly related to SBP vs. R inguinal hernia with significant R scrotal edema vs. SLE flare vs.  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 intake Discussed at length with patient's sister about need for close follow-up with multiple subspecialists PT/OT consulted and are recommending STR Patient/sister are in agreement with STR at this time He has been offered one bed thus far and patient/family are not satisfied with this option TOC consulted He is likely to be ready for dc by early next week and will need to either accept a bed or plan for dc to home with family, likely             Consultants: Nephrology  IR GI ID PT OT TOC team Dietician   Procedures: Paracentesis, 8/19, 8/20, 8/26, 8/28   Antibiotics: Ceftriaxone  8/19-26 Metronidazole  8/19-22 Cefazolin  8/27-9/7    30 Day Unplanned Readmission Risk Score    Flowsheet Row ED to Hosp-Admission (Current) from 12/14/2023 in [facility]  30 Day Unplanned Readmission Risk Score (%) 17.29 Filed at 12/25/2023 0800    This score is the patient's risk of an unplanned readmission within 30 days of being discharged (0 -100%). The score is based  on dignosis, age, lab data, medications, orders, and past utilization.   Low:  0-14.9   Medium: 15-21.9   High: 22-29.9   Extreme: 30 and above           Subjective: Feeling much better!  He is taking little pain medication, cognitively much more clear.   Objective: Vitals:   12/25/23 1220 12/25/23 1224  BP: 116/68 119/74  Pulse: 66 66  Resp: 17 17  Temp:  97.6 F (36.4 C)  SpO2: 96% 100%    Intake/Output Summary (Last 24 hours) at 12/25/2023 1359 Last data filed at 12/25/2023 1224 Gross per 24 hour  Intake 800 ml  Output 1000 ml  Net -200 ml   Filed Weights   12/21/23 1300 12/25/23 0825 12/25/23 1224  Weight: 63.7 kg 68.7 kg 67.7 kg    Exam:  General:  Appears frail, cachectic, chronically ill, much more alert and conversant Eyes:  normal lids, iris ENT:  grossly normal hearing, lips & tongue, mmm; suboptimal dentition Cardiovascular:  RRR. 1+ LE edema.  Respiratory:   CTA bilaterally with no wheezes/rales/rhonchi.  Normal respiratory effort. Abdomen:  moderate ascites, diffusely TTP, marked R scrotal edema Skin:  no rash or induration seen on limited exam Musculoskeletal:  generally poor strength, no bony abnormality Psychiatric:  blunted mood and affect, speech fluent and appropriate, AOx3 Neurologic:  no gross abnormalities noted on limited exam  Data Reviewed: I have reviewed the patient's lab results  since admission.  Pertinent labs for today include:  Na++ 128 K+ 5.2 Glucose 120 BUN 53/Creatinine 6.04/GFR 10 Phos 6.2 WBC 5.8 Hgb 9.2 Platelets 100 Ascites evaluation: Albumin  <1.5, glucose <20, LDH >2500, protein <3, WBC 10,425, 99% neutrophils Gram stain negative Culture pending, no growth x 24 hours     Family Communication: None present  Mobility: PT/OT Consulted and are recommending - Skilled Nursing-Short Term Rehab (<3 Hours/Day)12/23/2023 1700    Code Status: Full Code  Barriers to discharge:  placement options, still not medically  ready  Disposition: Status is: Inpatient Remains inpatient appropriate because: ongoing management     Time spent: 50 minutes  Unresulted Labs (From admission, onward)     Start     Ordered   12/26/23 0500  CBC with Differential/Platelet  Tomorrow morning,   R       Question:  Specimen collection method  Answer:  Lab=Lab collect   12/25/23 0827   12/26/23 0500  Comprehensive metabolic panel with GFR  Tomorrow morning,   R       Question:  Specimen collection method  Answer:  Lab=Lab collect   12/25/23 0827             Author: Delon Herald, MD 12/25/2023 1:59 PM  For on call review [email or web address].

## 2023-12-25 NOTE — Plan of Care (Signed)
  Problem: Education: Goal: Knowledge of General Education information will improve Description: Including pain rating scale, medication(s)/side effects and non-pharmacologic comfort measures Outcome: Progressing   Problem: Health Behavior/Discharge Planning: Goal: Ability to manage health-related needs will improve Outcome: Progressing   Problem: Clinical Measurements: Goal: Ability to maintain clinical measurements within normal limits will improve Outcome: Progressing Goal: Will remain free from infection Outcome: Progressing Goal: Respiratory complications will improve Outcome: Progressing Goal: Cardiovascular complication will be avoided Outcome: Progressing   Problem: Activity: Goal: Risk for activity intolerance will decrease Outcome: Progressing   Problem: Nutrition: Goal: Adequate nutrition will be maintained Outcome: Progressing   Problem: Coping: Goal: Level of anxiety will decrease Outcome: Progressing   Problem: Elimination: Goal: Will not experience complications related to bowel motility Outcome: Progressing Goal: Will not experience complications related to urinary retention Outcome: Progressing   Problem: Pain Managment: Goal: General experience of comfort will improve and/or be controlled Outcome: Progressing   Problem: Safety: Goal: Ability to remain free from injury will improve Outcome: Progressing   Problem: Skin Integrity: Goal: Risk for impaired skin integrity will decrease Outcome: Progressing   Problem: Safety: Goal: Non-violent Restraint(s) Outcome: Progressing   Problem: Safety: Goal: Non-violent Restraint(s) Outcome: Progressing   Problem: Fluid Volume: Goal: Compliance with measures to maintain balanced fluid volume will improve Outcome: Progressing   Problem: Health Behavior/Discharge Planning: Goal: Ability to manage health-related needs will improve Outcome: Progressing   Problem: Nutritional: Goal: Ability to make  healthy dietary choices will improve Outcome: Progressing   Problem: Clinical Measurements: Goal: Complications related to the disease process, condition or treatment will be avoided or minimized Outcome: Progressing

## 2023-12-25 NOTE — Progress Notes (Addendum)
 PT Cancellation Note  Patient Details Name: [name] MRN: [id number] DOB: 08/21/1970   Cancelled Treatment:    Reason Eval/Treat Not Completed: Patient at procedure or test/unavailable  Off unit for dialysis. Will check back later as schedule permits.  1513: Pt off unit again when I checked back. Will continue to follow during admission.  Leontine Roads, PT, DPT [facility] Services Physical Therapist Office: [phone] Website: [email or web address]  Leontine GORMAN Roads 12/25/2023, 8:28 AM

## 2023-12-26 ENCOUNTER — Other Ambulatory Visit (HOSPITAL_COMMUNITY)

## 2023-12-26 DIAGNOSIS — N186 End stage renal disease: Secondary | ICD-10-CM | POA: Diagnosis not present

## 2023-12-26 DIAGNOSIS — I120 Hypertensive chronic kidney disease with stage 5 chronic kidney disease or end stage renal disease: Secondary | ICD-10-CM | POA: Diagnosis not present

## 2023-12-26 DIAGNOSIS — K652 Spontaneous bacterial peritonitis: Secondary | ICD-10-CM | POA: Diagnosis not present

## 2023-12-26 DIAGNOSIS — Z992 Dependence on renal dialysis: Secondary | ICD-10-CM | POA: Diagnosis not present

## 2023-12-26 LAB — COMPREHENSIVE METABOLIC PANEL WITH GFR
ALT: 6 U/L (ref 0–44)
AST: 36 U/L (ref 15–41)
Albumin: 1.5 g/dL — ABNORMAL LOW (ref 3.5–5.0)
Alkaline Phosphatase: 92 U/L (ref 38–126)
Anion gap: 10 (ref 5–15)
BUN: 37 mg/dL — ABNORMAL HIGH (ref 6–20)
CO2: 25 mmol/L (ref 22–32)
Calcium: 7.5 mg/dL — ABNORMAL LOW (ref 8.9–10.3)
Chloride: 94 mmol/L — ABNORMAL LOW (ref 98–111)
Creatinine, Ser: 4.45 mg/dL — ABNORMAL HIGH (ref 0.61–1.24)
GFR, Estimated: 15 mL/min — ABNORMAL LOW (ref >60)
Glucose, Bld: 83 mg/dL (ref 70–99)
Potassium: 4.7 mmol/L (ref 3.5–5.1)
Sodium: 129 mmol/L — ABNORMAL LOW (ref 135–145)
Total Bilirubin: 0.4 mg/dL (ref 0.0–1.2)
Total Protein: 6.2 g/dL — ABNORMAL LOW (ref 6.5–8.1)

## 2023-12-26 LAB — CBC WITH DIFFERENTIAL/PLATELET
Abs Immature Granulocytes: 0.3 K/uL — ABNORMAL HIGH (ref 0.00–0.07)
Basophils Absolute: 0 K/uL (ref 0.0–0.1)
Basophils Relative: 0 %
Eosinophils Absolute: 0 K/uL (ref 0.0–0.5)
Eosinophils Relative: 0 %
HCT: 30.7 % — ABNORMAL LOW (ref 39.0–52.0)
Hemoglobin: 9.6 g/dL — ABNORMAL LOW (ref 13.0–17.0)
Lymphocytes Relative: 1 %
Lymphs Abs: 0.1 K/uL — ABNORMAL LOW (ref 0.7–4.0)
MCH: 27.7 pg (ref 26.0–34.0)
MCHC: 31.3 g/dL (ref 30.0–36.0)
MCV: 88.7 fL (ref 80.0–100.0)
Metamyelocytes Relative: 3 %
Monocytes Absolute: 0.2 K/uL (ref 0.1–1.0)
Monocytes Relative: 3 %
Myelocytes: 2 %
Neutro Abs: 4.6 K/uL (ref 1.7–7.7)
Neutrophils Relative %: 91 %
Platelets: 103 K/uL — ABNORMAL LOW (ref 150–400)
RBC: 3.46 MIL/uL — ABNORMAL LOW (ref 4.22–5.81)
RDW: 17.5 % — ABNORMAL HIGH (ref 11.5–15.5)
WBC: 5 K/uL (ref 4.0–10.5)
nRBC: 0 % (ref 0.0–0.2)

## 2023-12-26 NOTE — Progress Notes (Signed)
 Subjective: Abdominal cramping from colace.  Objective: Vital signs in last 24 hours: Temp:  [97.5 F (36.4 C)-98.1 F (36.7 C)] 98 F (36.7 C) (08/31 0800) Pulse Rate:  [65-69] 69 (08/31 0800) Resp:  [11-18] 16 (08/31 0800) BP: (99-137)/(67-83) 137/79 (08/31 0800) SpO2:  [96 %-100 %] 99 % (08/31 0800) Weight:  [67.7 kg] 67.7 kg (08/30 1224) Last BM Date : 12/26/23  Intake/Output from previous day: 08/30 0701 - 08/31 0700 In: 360 [P.O.:360] Out: 1000  Intake/Output this shift: No intake/output data recorded.  General appearance: alert and no distress GI: soft, distended with ascites  Lab Results: Recent Labs    12/24/23 0414 12/25/23 0853 12/26/23 0727  WBC 6.5 5.8 5.0  HGB 10.4* 9.2* 9.6*  HCT 32.6* 29.1* 30.7*  PLT 88* 100* 103*   BMET Recent Labs    12/24/23 0414 12/25/23 0854 12/26/23 0727  NA 130* 128* 129*  K 4.8 5.2* 4.7  CL 92* 92* 94*  CO2 28 26 25   GLUCOSE 149* 120* 83  BUN 30* 53* 37*  CREATININE 4.60* 6.04* 4.45*  CALCIUM  7.8* 7.7* 7.5*   LFT Recent Labs    12/26/23 0727  PROT 6.2*  ALBUMIN  1.5*  AST 36  ALT 6  ALKPHOS 92  BILITOT 0.4   PT/INR No results for input(s): LABPROT, INR in the last 72 hours. Hepatitis Panel No results for input(s): HEPBSAG, HCVAB, HEPAIGM, HEPBIGM in the last 72 hours. C-Diff No results for input(s): CDIFFTOX in the last 72 hours. Fecal Lactopherrin No results for input(s): FECLLACTOFRN in the last 72 hours.  Studies/Results: CT ABDOMEN PELVIS W CONTRAST Result Date: 12/25/2023 CLINICAL DATA:  Provided history: Ascites, fever of unknown origin. EXAM: CT CHEST, ABDOMEN, AND PELVIS WITH CONTRAST TECHNIQUE: Multidetector CT imaging of the chest, abdomen and pelvis was performed following the standard protocol during bolus administration of intravenous contrast. RADIATION DOSE REDUCTION: This exam was performed according to the departmental dose-optimization program which includes automated  exposure control, adjustment of the mA and/or kV according to patient size and/or use of iterative reconstruction technique. CONTRAST:  75mL OMNIPAQUE  IOHEXOL  350 MG/ML SOLN COMPARISON:  Abdominopelvic CT 12/14/2023 FINDINGS: CT CHEST FINDINGS Cardiovascular: The heart is enlarged. Coronary artery and mitral annulus calcifications. Trace pericardial effusion. Mild aortic atherosclerosis without aneurysm or acute aortic findings. Main pulmonary artery is dilated at 3.8 cm. No obvious central pulmonary embolus on this exam not tailored to pulmonary artery assessment. Dilated vasculature in the left upper extremity, likely related to dialysis access. Mediastinum/Nodes: No mediastinal, hilar, or axillary adenopathy. No visible thyroid  nodule. Mild wall thickening of the distal esophagus. Lungs/Pleura: Mild biapical pleuroparenchymal scarring. Trace dependent subsegmental atelectasis in the lower lobes. Trace left pleural effusion. No confluent airspace disease or evidence of pneumonia. No features of pulmonary edema. No endobronchial lesion or debris. Musculoskeletal: There are no acute or suspicious osseous abnormalities. CT ABDOMEN PELVIS FINDINGS Hepatobiliary: No focal liver lesion. Question of subtle capsular nodularity of the liver. Layering density in the gallbladder was not seen on prior exam and may represent vicarious excretion of contrast. No biliary dilatation. Pancreas: No ductal dilatation or inflammation. Spleen: Mildly enlarged, 13.8 cm AP. Heterogeneous enhancement felt to be due to phase of contrast. Adrenals/Urinary Tract: No adrenal nodule. Bilateral renal parenchymal atrophy consistent with chronic renal disease. No hydronephrosis or suspicious renal lesion. Absent renal excretion again demonstrated on delayed phase. The urinary bladder is decompressed. Stomach/Bowel: Detailed bowel assessment is limited in the absence of enteric contrast and presence of  ascites. Wall thickening of the distal  esophagus. Equivocal pre pyloric gastric wall thickening. Diffuse small bowel wall thickening which is nonspecific in the setting of ascites. There is scattered fluid-filled and mildly dilated loops of small bowel. Normal appendix. Moderate formed stool in the colon. Areas of colonic wall thickening involving the ascending and transverse colon. Left colonic diverticulosis without focal diverticulitis. No bowel pneumatosis. Vascular/Lymphatic: Aortic atherosclerosis without aneurysm. Patent portal vein. Tortuous splenic vein likely portal hypertension. Scattered small retroperitoneal nodes are unchanged over the last 11 days. Reproductive: Prostate is unremarkable.  Right scrotal hydrocele. Other: Moderate volume ascites again demonstrated. There is increasing peritoneal thickening and enhancement about ascites particularly in the right abdomen and pelvis. Moderate to large volume ascites with enhancement tracks into a right inguinal hernia extending into the scrotum. There is generalized edema and soft tissue thickening of the omentum. No free intra-abdominal air. Musculoskeletal: There are no acute or suspicious osseous abnormalities. Chronic bilateral femoral head avascular necrosis. IMPRESSION: 1. Moderate volume ascites. Increasing peritoneal thickening and enhancement about ascites particularly in the right abdomen and pelvis. Findings are suspicious for peritonitis. Generalized edema and soft tissue thickening of the omentum, nonspecific in the setting of ascites. 2. Ascites with enhancement extends into a right inguinal hernia and into the scrotum. 3. Areas of colonic and small bowel wall thickening, nonspecific in the setting of ascites. 4. Wall thickening of the distal esophagus, can be seen with reflux or esophagitis. 5. Cardiomegaly with coronary artery calcifications. 6. Dilated main pulmonary artery, can be seen with pulmonary arterial hypertension. 7. Trace left pleural effusion. 8. Question of subtle  capsular nodularity of the liver, can be seen with cirrhosis. Mild splenomegaly and tortuous splenic vein likely portal hypertension. Aortic Atherosclerosis (ICD10-I70.0). Electronically Signed   By: Andrea Gasman M.D.   On: 12/25/2023 15:57   CT CHEST W CONTRAST Result Date: 12/25/2023 CLINICAL DATA:  Provided history: Ascites, fever of unknown origin. EXAM: CT CHEST, ABDOMEN, AND PELVIS WITH CONTRAST TECHNIQUE: Multidetector CT imaging of the chest, abdomen and pelvis was performed following the standard protocol during bolus administration of intravenous contrast. RADIATION DOSE REDUCTION: This exam was performed according to the departmental dose-optimization program which includes automated exposure control, adjustment of the mA and/or kV according to patient size and/or use of iterative reconstruction technique. CONTRAST:  75mL OMNIPAQUE  IOHEXOL  350 MG/ML SOLN COMPARISON:  Abdominopelvic CT 12/14/2023 FINDINGS: CT CHEST FINDINGS Cardiovascular: The heart is enlarged. Coronary artery and mitral annulus calcifications. Trace pericardial effusion. Mild aortic atherosclerosis without aneurysm or acute aortic findings. Main pulmonary artery is dilated at 3.8 cm. No obvious central pulmonary embolus on this exam not tailored to pulmonary artery assessment. Dilated vasculature in the left upper extremity, likely related to dialysis access. Mediastinum/Nodes: No mediastinal, hilar, or axillary adenopathy. No visible thyroid  nodule. Mild wall thickening of the distal esophagus. Lungs/Pleura: Mild biapical pleuroparenchymal scarring. Trace dependent subsegmental atelectasis in the lower lobes. Trace left pleural effusion. No confluent airspace disease or evidence of pneumonia. No features of pulmonary edema. No endobronchial lesion or debris. Musculoskeletal: There are no acute or suspicious osseous abnormalities. CT ABDOMEN PELVIS FINDINGS Hepatobiliary: No focal liver lesion. Question of subtle capsular  nodularity of the liver. Layering density in the gallbladder was not seen on prior exam and may represent vicarious excretion of contrast. No biliary dilatation. Pancreas: No ductal dilatation or inflammation. Spleen: Mildly enlarged, 13.8 cm AP. Heterogeneous enhancement felt to be due to phase of contrast. Adrenals/Urinary Tract: No adrenal nodule. Bilateral  renal parenchymal atrophy consistent with chronic renal disease. No hydronephrosis or suspicious renal lesion. Absent renal excretion again demonstrated on delayed phase. The urinary bladder is decompressed. Stomach/Bowel: Detailed bowel assessment is limited in the absence of enteric contrast and presence of ascites. Wall thickening of the distal esophagus. Equivocal pre pyloric gastric wall thickening. Diffuse small bowel wall thickening which is nonspecific in the setting of ascites. There is scattered fluid-filled and mildly dilated loops of small bowel. Normal appendix. Moderate formed stool in the colon. Areas of colonic wall thickening involving the ascending and transverse colon. Left colonic diverticulosis without focal diverticulitis. No bowel pneumatosis. Vascular/Lymphatic: Aortic atherosclerosis without aneurysm. Patent portal vein. Tortuous splenic vein likely portal hypertension. Scattered small retroperitoneal nodes are unchanged over the last 11 days. Reproductive: Prostate is unremarkable.  Right scrotal hydrocele. Other: Moderate volume ascites again demonstrated. There is increasing peritoneal thickening and enhancement about ascites particularly in the right abdomen and pelvis. Moderate to large volume ascites with enhancement tracks into a right inguinal hernia extending into the scrotum. There is generalized edema and soft tissue thickening of the omentum. No free intra-abdominal air. Musculoskeletal: There are no acute or suspicious osseous abnormalities. Chronic bilateral femoral head avascular necrosis. IMPRESSION: 1. Moderate volume  ascites. Increasing peritoneal thickening and enhancement about ascites particularly in the right abdomen and pelvis. Findings are suspicious for peritonitis. Generalized edema and soft tissue thickening of the omentum, nonspecific in the setting of ascites. 2. Ascites with enhancement extends into a right inguinal hernia and into the scrotum. 3. Areas of colonic and small bowel wall thickening, nonspecific in the setting of ascites. 4. Wall thickening of the distal esophagus, can be seen with reflux or esophagitis. 5. Cardiomegaly with coronary artery calcifications. 6. Dilated main pulmonary artery, can be seen with pulmonary arterial hypertension. 7. Trace left pleural effusion. 8. Question of subtle capsular nodularity of the liver, can be seen with cirrhosis. Mild splenomegaly and tortuous splenic vein likely portal hypertension. Aortic Atherosclerosis (ICD10-I70.0). Electronically Signed   By: Andrea Gasman M.D.   On: 12/25/2023 15:57    Medications: Scheduled:  carvedilol   25 mg Oral BID   Chlorhexidine  Gluconate Cloth  6 each Topical Q0600   docusate sodium   100 mg Oral BID   doxercalciferol   2 mcg Intravenous Q T,Th,Sa-HD   feeding supplement (NEPRO CARB STEADY)  237 mL Oral Q24H   folic acid   1 mg Oral Daily   hydroxychloroquine   200 mg Oral BID   levothyroxine   50 mcg Oral QAC breakfast   lidocaine -EPINEPHrine   10 mL Intradermal Once   lidocaine -EPINEPHrine   30 mL Infiltration Once   multivitamin  1 tablet Oral QHS   polyethylene glycol  17 g Oral Daily   predniSONE   15 mg Oral Q breakfast   thiamine   100 mg Oral Daily   Or   thiamine   100 mg Intravenous Daily   Continuous:   ceFAZolin  (ANCEF ) IV Stopped (12/25/23 1533)    Assessment/Plan: 1) SBP. 2) Cirrhosis based on recent CT and elevated FIB-4. 3) SLE. 4) ESRD.   The patient is stable clinically.  Subjectively it seems that his abdomen may be increasing with ascites, as expected.  His weight yesterday was 67 kg.  It  may be that he will need a paracentesis tomorrow.  Clinically he feels well and he does not feel any pain from the ascites.  Plan: 1) ? Paracentesis tomorrow. 2) Daily weights.  This is the best way to assess for accumulation of ascites  outside of overt abdominal distension. 3) Continue with cefazolin  per ID.  LOS: 12 days   [name] D 12/26/2023, 11:14 AM

## 2023-12-26 NOTE — Progress Notes (Signed)
 [facility] Progress Note   Subjective:   Seen in room - ongoing symptomatic improvement. ID involved, plan for another paracentesis - LVP - and sending fluid for Cx again. No CP/dyspnea. Pain much improved.  Objective Vitals:   12/25/23 1628 12/25/23 1949 12/26/23 0444 12/26/23 0800  BP: 117/74 123/83 127/80 137/79  Pulse: 66 67 66 69  Resp: 16 18 18 16   Temp: 97.8 F (36.6 C) (!) 97.5 F (36.4 C) 98.1 F (36.7 C) 98 F (36.7 C)  TempSrc:  Oral Oral Oral  SpO2: 97% 100% 98% 99%  Weight:      Height:       Physical Exam General: Chronically ill appearing, NAD. Room air Heart: RRR; no murmur Lungs: CTA anteriorly Abdomen: distended, mild TTP Extremities: trace BLE edema Dialysis Access: LUE AVF +t/b   Additional Objective Labs: Basic Metabolic Panel: Recent Labs  Lab 12/20/23 0542 12/21/23 0509 12/22/23 0441 12/23/23 0605 12/24/23 0414 12/25/23 0854  NA 129*   < > 130* 130* 130* 128*  K 3.9   < > 4.2 4.9 4.8 5.2*  CL 96*   < > 94* 94* 92* 92*  CO2 26   < > 26 26 28 26   GLUCOSE 137*   < > 129* 81 149* 120*  BUN 45*   < > 34* 46* 30* 53*  CREATININE 6.28*   < > 5.03* 6.56* 4.60* 6.04*  CALCIUM  7.7*   < > 7.6* 7.7* 7.8* 7.7*  PHOS 4.0  --  4.6  --   --  6.2*   < > = values in this interval not displayed.   Liver Function Tests: Recent Labs  Lab 12/20/23 0542 12/23/23 0605 12/24/23 0414 12/25/23 0854  AST 31 29 28   --   ALT 15 11 10   --   ALKPHOS 49 75 89  --   BILITOT 0.5 0.7 0.6  --   PROT 6.0* 6.3* 6.6  --   ALBUMIN  <1.5* <1.5* 1.6* 1.5*   CBC: Recent Labs  Lab 12/22/23 0441 12/23/23 0605 12/24/23 0414 12/25/23 0853 12/26/23 0727  WBC 8.0 8.5 6.5 5.8 5.0  NEUTROABS  --  6.4 5.3  --  PENDING  HGB 10.6* 10.9* 10.4* 9.2* 9.6*  HCT 33.4* 33.9* 32.6* 29.1* 30.7*  MCV 87.7 86.9 87.6 87.4 88.7  PLT 86* 92* 88* 100* 103*   Medications:   ceFAZolin  (ANCEF ) IV Stopped (12/25/23 1533)    carvedilol   25 mg Oral BID   Chlorhexidine   Gluconate Cloth  6 each Topical Q0600   docusate sodium   100 mg Oral BID   doxercalciferol   2 mcg Intravenous Q T,Th,Sa-HD   feeding supplement (NEPRO CARB STEADY)  237 mL Oral Q24H   folic acid   1 mg Oral Daily   hydroxychloroquine   200 mg Oral BID   levothyroxine   50 mcg Oral QAC breakfast   lidocaine -EPINEPHrine   10 mL Intradermal Once   lidocaine -EPINEPHrine   30 mL Infiltration Once   multivitamin  1 tablet Oral QHS   polyethylene glycol  17 g Oral Daily   predniSONE   15 mg Oral Q breakfast   thiamine   100 mg Oral Daily   Or   thiamine   100 mg Intravenous Daily    Dialysis Orders TTS - AF 3:45hr, 400/A1.5, EDW 71kg, 2K/2C bath. AVF, no heparin  - Mircera 150mcg IV q 2 weeks (last 8/12) - Hectoral 2mcg IV q HD   Assessment/Plan: SBP: Fluid Cx + Klebsiella, on course of Cefazolin . Symptoms much improved,  btu repeat Cx with higher cell count. ID involved -> plan is for another paracentesis/testing for TB. AMS: Infection v. alcohol withdrawal, per primary. ESRD: Continue HD on TTS schedule - next HD 9/2. HTN/volume: BP stable now - ARB and hydralazine d/c'd - remains on BB, UF as tolerated. Anemia of ESRD: Hgb 9.6 - will resume ESA. Secondary HPTH: CorrCa/Phos ok - resumed hectoral q HD, not getting binders here Nutrition: Alb very low, continue protein supps. SLE: C3/4 low, dsDNA high - c/w flare - on plaquenil  and prednisone  taper - feels better. Recurrent ascites: Gets intermittent paracenteses since 2022.   Angel Boehringer, PA-C 12/26/2023, 9:38 AM  [employer]

## 2023-12-26 NOTE — Progress Notes (Signed)
 Subjective:  Pt has been more sleepy today   Antibiotics:  Anti-infectives (From admission, onward)    Start     Dose/Rate Route Frequency Ordered Stop   12/22/23 1400  hydroxychloroquine  (PLAQUENIL ) tablet 200 mg        200 mg Oral 2 times daily 12/22/23 1252     12/22/23 1200  ceFAZolin  (ANCEF ) IVPB 1 g/50 mL premix        1 g 100 mL/hr over 30 Minutes Intravenous Every 24 hours 12/21/23 1039 01/02/24 1159   12/15/23 0800  cefTRIAXone  (ROCEPHIN ) 2 g in sodium chloride  0.9 % 100 mL IVPB  Status:  Discontinued        2 g 200 mL/hr over 30 Minutes Intravenous Every 24 hours 12/14/23 1740 12/21/23 1039   12/14/23 1745  metroNIDAZOLE  (FLAGYL ) IVPB 500 mg  Status:  Discontinued        500 mg 100 mL/hr over 60 Minutes Intravenous Every 12 hours 12/14/23 1740 12/17/23 1138   12/14/23 1545  cefTRIAXone  (ROCEPHIN ) 2 g in sodium chloride  0.9 % 100 mL IVPB        2 g 200 mL/hr over 30 Minutes Intravenous  Once 12/14/23 1534 12/14/23 1649       Medications: Scheduled Meds:  carvedilol   25 mg Oral BID   Chlorhexidine  Gluconate Cloth  6 each Topical Q0600   docusate sodium   100 mg Oral BID   doxercalciferol   2 mcg Intravenous Q T,Th,Sa-HD   feeding supplement (NEPRO CARB STEADY)  237 mL Oral Q24H   folic acid   1 mg Oral Daily   hydroxychloroquine   200 mg Oral BID   levothyroxine   50 mcg Oral QAC breakfast   lidocaine -EPINEPHrine   10 mL Intradermal Once   lidocaine -EPINEPHrine   30 mL Infiltration Once   multivitamin  1 tablet Oral QHS   polyethylene glycol  17 g Oral Daily   predniSONE   15 mg Oral Q breakfast   thiamine   100 mg Oral Daily   Or   thiamine   100 mg Intravenous Daily   Continuous Infusions:   ceFAZolin  (ANCEF ) IV 1 g (12/26/23 1151)   PRN Meds:.acetaminophen  **OR** [DISCONTINUED] acetaminophen , albuterol , bisacodyl , bisacodyl , melatonin, menthol -cetylpyridinium, ondansetron  **OR** ondansetron  (ZOFRAN ) IV, mouth rinse, oxyCODONE     Objective: Weight  change:   Intake/Output Summary (Last 24 hours) at 12/26/2023 1643 Last data filed at 12/26/2023 1151 Gross per 24 hour  Intake 360 ml  Output 0 ml  Net 360 ml   Blood pressure 137/79, pulse 69, temperature 98 F (36.7 C), temperature source Oral, resp. rate 16, height 5' 11 (1.803 m), weight 67.7 kg, SpO2 99%. Temp:  [97.5 F (36.4 C)-98.1 F (36.7 C)] 98 F (36.7 C) (08/31 0800) Pulse Rate:  [66-69] 69 (08/31 0800) Resp:  [16-18] 16 (08/31 0800) BP: (123-137)/(79-83) 137/79 (08/31 0800) SpO2:  [98 %-100 %] 99 % (08/31 0800)  Physical Exam: Physical Exam Constitutional:      Appearance: He is well-developed.  HENT:     Head: Normocephalic and atraumatic.  Eyes:     Conjunctiva/sclera: Conjunctivae normal.  Cardiovascular:     Rate and Rhythm: Normal rate and regular rhythm.  Pulmonary:     Effort: Pulmonary effort is normal. No respiratory distress.     Breath sounds: No wheezing.  Abdominal:     General: There is distension.     Palpations: Abdomen is soft.  Musculoskeletal:        General: Normal range of motion.  Cervical back: Normal range of motion and neck supple.  Skin:    General: Skin is warm and dry.     Findings: No erythema or rash.  Neurological:     General: No focal deficit present.     Mental Status: He is alert and oriented to person, place, and time.  Psychiatric:        Mood and Affect: Mood normal.        Behavior: Behavior normal.        Thought Content: Thought content normal.        Judgment: Judgment normal.      CBC:    BMET Recent Labs    12/25/23 0854 12/26/23 0727  NA 128* 129*  K 5.2* 4.7  CL 92* 94*  CO2 26 25  GLUCOSE 120* 83  BUN 53* 37*  CREATININE 6.04* 4.45*  CALCIUM  7.7* 7.5*     Liver Panel  Recent Labs    12/24/23 0414 12/25/23 0854 12/26/23 0727  PROT 6.6  --  6.2*  ALBUMIN  1.6* 1.5* 1.5*  AST 28  --  36  ALT 10  --  6  ALKPHOS 89  --  92  BILITOT 0.6  --  0.4       Sedimentation  Rate No results for input(s): ESRSEDRATE in the last 72 hours. C-Reactive Protein No results for input(s): CRP in the last 72 hours.  Micro Results: Recent Results (from the past 720 hours)  Body fluid culture w Gram Stain     Status: Abnormal   Collection Time: 12/14/23  3:02 PM   Specimen: Peritoneal Washings; Body Fluid  Result Value Ref Range Status   Specimen Description PERITONEAL  Final   Special Requests NONE  Final   Gram Stain   Final    FEW WBC PRESENT, PREDOMINANTLY PMN ABUNDANT GRAM NEGATIVE RODS Gram Stain Report Called to,Read Back By and Verified With: RN EMERSON MCARDLE [phone] @ 1747 FH Performed at [facility], [address], [place], [place] [address]    Culture KLEBSIELLA PNEUMONIAE (A)  Final   Report Status 12/16/2023 FINAL  Final   Organism ID, Bacteria KLEBSIELLA PNEUMONIAE  Final      Susceptibility   Klebsiella pneumoniae - MIC*    AMPICILLIN RESISTANT Resistant     CEFAZOLIN  (NON-URINE) 2 SENSITIVE Sensitive     CEFEPIME  <=0.12 SENSITIVE Sensitive     ERTAPENEM <=0.12 SENSITIVE Sensitive     CEFTRIAXONE  <=0.25 SENSITIVE Sensitive     CIPROFLOXACIN <=0.06 SENSITIVE Sensitive     GENTAMICIN <=1 SENSITIVE Sensitive     MEROPENEM <=0.25 SENSITIVE Sensitive     TRIMETH /SULFA  <=20 SENSITIVE Sensitive     AMPICILLIN/SULBACTAM <=2 SENSITIVE Sensitive     PIP/TAZO Value in next row Sensitive ug/mL     <=4 SENSITIVEThis is a modified FDA-approved test that has been validated and its performance characteristics determined by the reporting laboratory.  This laboratory is certified under the Clinical Laboratory Improvement Amendments CLIA as qualified to perform high complexity clinical laboratory testing.    * KLEBSIELLA PNEUMONIAE  Blood culture (routine x 2)     Status: None   Collection Time: 12/14/23  3:50 PM   Specimen: BLOOD  Result Value Ref Range Status   Specimen Description BLOOD SITE NOT SPECIFIED  Final   Special Requests   Final    BOTTLES  DRAWN AEROBIC AND ANAEROBIC Blood Culture results may not be optimal due to an inadequate volume of blood received in  culture bottles   Culture   Final    NO GROWTH 5 DAYS Performed at [facility], [address] [place], [place] [address]    Report Status 12/19/2023 FINAL  Final  Blood culture (routine x 2)     Status: None   Collection Time: 12/14/23  3:58 PM   Specimen: BLOOD  Result Value Ref Range Status   Specimen Description BLOOD SITE NOT SPECIFIED  Final   Special Requests   Final    BOTTLES DRAWN AEROBIC AND ANAEROBIC Blood Culture results may not be optimal due to an inadequate volume of blood received in culture bottles   Culture   Final    NO GROWTH 5 DAYS Performed at [facility], [address] [place], [place] [address]    Report Status 12/19/2023 FINAL  Final  Body fluid culture w Gram Stain     Status: None (Preliminary result)   Collection Time: 12/24/23 12:35 PM   Specimen: Abdomen; Peritoneal Fluid  Result Value Ref Range Status   Specimen Description PERITONEAL  Final   Special Requests NONE  Final   Gram Stain   Final    RARE WBC PRESENT,BOTH PMN AND MONONUCLEAR NO ORGANISMS SEEN    Culture   Final    NO GROWTH 2 DAYS Performed at [facility], [address], [place], [place] [address]    Report Status PENDING  Incomplete    Studies/Results: CT ABDOMEN PELVIS W CONTRAST Result Date: 12/25/2023 CLINICAL DATA:  Provided history: Ascites, fever of unknown origin. EXAM: CT CHEST, ABDOMEN, AND PELVIS WITH CONTRAST TECHNIQUE: Multidetector CT imaging of the chest, abdomen and pelvis was performed following the standard protocol during bolus administration of intravenous contrast. RADIATION DOSE REDUCTION: This exam was performed according to the departmental dose-optimization program which includes automated exposure control, adjustment of the mA and/or kV according to patient size and/or use of iterative reconstruction technique. CONTRAST:   75mL OMNIPAQUE  IOHEXOL  350 MG/ML SOLN COMPARISON:  Abdominopelvic CT 12/14/2023 FINDINGS: CT CHEST FINDINGS Cardiovascular: The heart is enlarged. Coronary artery and mitral annulus calcifications. Trace pericardial effusion. Mild aortic atherosclerosis without aneurysm or acute aortic findings. Main pulmonary artery is dilated at 3.8 cm. No obvious central pulmonary embolus on this exam not tailored to pulmonary artery assessment. Dilated vasculature in the left upper extremity, likely related to dialysis access. Mediastinum/Nodes: No mediastinal, hilar, or axillary adenopathy. No visible thyroid  nodule. Mild wall thickening of the distal esophagus. Lungs/Pleura: Mild biapical pleuroparenchymal scarring. Trace dependent subsegmental atelectasis in the lower lobes. Trace left pleural effusion. No confluent airspace disease or evidence of pneumonia. No features of pulmonary edema. No endobronchial lesion or debris. Musculoskeletal: There are no acute or suspicious osseous abnormalities. CT ABDOMEN PELVIS FINDINGS Hepatobiliary: No focal liver lesion. Question of subtle capsular nodularity of the liver. Layering density in the gallbladder was not seen on prior exam and may represent vicarious excretion of contrast. No biliary dilatation. Pancreas: No ductal dilatation or inflammation. Spleen: Mildly enlarged, 13.8 cm AP. Heterogeneous enhancement felt to be due to phase of contrast. Adrenals/Urinary Tract: No adrenal nodule. Bilateral renal parenchymal atrophy consistent with chronic renal disease. No hydronephrosis or suspicious renal lesion. Absent renal excretion again demonstrated on delayed phase. The urinary bladder is decompressed. Stomach/Bowel: Detailed bowel assessment is limited in the absence of enteric contrast and presence of ascites. Wall thickening of the distal esophagus. Equivocal pre pyloric gastric wall thickening. Diffuse small bowel wall thickening which is nonspecific in  the setting of ascites.  There is scattered fluid-filled and mildly dilated loops of small bowel. Normal appendix. Moderate formed stool in the colon. Areas of colonic wall thickening involving the ascending and transverse colon. Left colonic diverticulosis without focal diverticulitis. No bowel pneumatosis. Vascular/Lymphatic: Aortic atherosclerosis without aneurysm. Patent portal vein. Tortuous splenic vein likely portal hypertension. Scattered small retroperitoneal nodes are unchanged over the last 11 days. Reproductive: Prostate is unremarkable.  Right scrotal hydrocele. Other: Moderate volume ascites again demonstrated. There is increasing peritoneal thickening and enhancement about ascites particularly in the right abdomen and pelvis. Moderate to large volume ascites with enhancement tracks into a right inguinal hernia extending into the scrotum. There is generalized edema and soft tissue thickening of the omentum. No free intra-abdominal air. Musculoskeletal: There are no acute or suspicious osseous abnormalities. Chronic bilateral femoral head avascular necrosis. IMPRESSION: 1. Moderate volume ascites. Increasing peritoneal thickening and enhancement about ascites particularly in the right abdomen and pelvis. Findings are suspicious for peritonitis. Generalized edema and soft tissue thickening of the omentum, nonspecific in the setting of ascites. 2. Ascites with enhancement extends into a right inguinal hernia and into the scrotum. 3. Areas of colonic and small bowel wall thickening, nonspecific in the setting of ascites. 4. Wall thickening of the distal esophagus, can be seen with reflux or esophagitis. 5. Cardiomegaly with coronary artery calcifications. 6. Dilated main pulmonary artery, can be seen with pulmonary arterial hypertension. 7. Trace left pleural effusion. 8. Question of subtle capsular nodularity of the liver, can be seen with cirrhosis. Mild splenomegaly and tortuous splenic vein likely portal hypertension. Aortic  Atherosclerosis (ICD10-I70.0). Electronically Signed   By: Andrea Gasman M.D.   On: 12/25/2023 15:57   CT CHEST W CONTRAST Result Date: 12/25/2023 CLINICAL DATA:  Provided history: Ascites, fever of unknown origin. EXAM: CT CHEST, ABDOMEN, AND PELVIS WITH CONTRAST TECHNIQUE: Multidetector CT imaging of the chest, abdomen and pelvis was performed following the standard protocol during bolus administration of intravenous contrast. RADIATION DOSE REDUCTION: This exam was performed according to the departmental dose-optimization program which includes automated exposure control, adjustment of the mA and/or kV according to patient size and/or use of iterative reconstruction technique. CONTRAST:  75mL OMNIPAQUE  IOHEXOL  350 MG/ML SOLN COMPARISON:  Abdominopelvic CT 12/14/2023 FINDINGS: CT CHEST FINDINGS Cardiovascular: The heart is enlarged. Coronary artery and mitral annulus calcifications. Trace pericardial effusion. Mild aortic atherosclerosis without aneurysm or acute aortic findings. Main pulmonary artery is dilated at 3.8 cm. No obvious central pulmonary embolus on this exam not tailored to pulmonary artery assessment. Dilated vasculature in the left upper extremity, likely related to dialysis access. Mediastinum/Nodes: No mediastinal, hilar, or axillary adenopathy. No visible thyroid  nodule. Mild wall thickening of the distal esophagus. Lungs/Pleura: Mild biapical pleuroparenchymal scarring. Trace dependent subsegmental atelectasis in the lower lobes. Trace left pleural effusion. No confluent airspace disease or evidence of pneumonia. No features of pulmonary edema. No endobronchial lesion or debris. Musculoskeletal: There are no acute or suspicious osseous abnormalities. CT ABDOMEN PELVIS FINDINGS Hepatobiliary: No focal liver lesion. Question of subtle capsular nodularity of the liver. Layering density in the gallbladder was not seen on prior exam and may represent vicarious excretion of contrast. No biliary  dilatation. Pancreas: No ductal dilatation or inflammation. Spleen: Mildly enlarged, 13.8 cm AP. Heterogeneous enhancement felt to be due to phase of contrast. Adrenals/Urinary Tract: No adrenal nodule. Bilateral renal parenchymal atrophy consistent with chronic renal disease. No hydronephrosis or suspicious renal lesion. Absent renal excretion again demonstrated on delayed phase.  The urinary bladder is decompressed. Stomach/Bowel: Detailed bowel assessment is limited in the absence of enteric contrast and presence of ascites. Wall thickening of the distal esophagus. Equivocal pre pyloric gastric wall thickening. Diffuse small bowel wall thickening which is nonspecific in the setting of ascites. There is scattered fluid-filled and mildly dilated loops of small bowel. Normal appendix. Moderate formed stool in the colon. Areas of colonic wall thickening involving the ascending and transverse colon. Left colonic diverticulosis without focal diverticulitis. No bowel pneumatosis. Vascular/Lymphatic: Aortic atherosclerosis without aneurysm. Patent portal vein. Tortuous splenic vein likely portal hypertension. Scattered small retroperitoneal nodes are unchanged over the last 11 days. Reproductive: Prostate is unremarkable.  Right scrotal hydrocele. Other: Moderate volume ascites again demonstrated. There is increasing peritoneal thickening and enhancement about ascites particularly in the right abdomen and pelvis. Moderate to large volume ascites with enhancement tracks into a right inguinal hernia extending into the scrotum. There is generalized edema and soft tissue thickening of the omentum. No free intra-abdominal air. Musculoskeletal: There are no acute or suspicious osseous abnormalities. Chronic bilateral femoral head avascular necrosis. IMPRESSION: 1. Moderate volume ascites. Increasing peritoneal thickening and enhancement about ascites particularly in the right abdomen and pelvis. Findings are suspicious for  peritonitis. Generalized edema and soft tissue thickening of the omentum, nonspecific in the setting of ascites. 2. Ascites with enhancement extends into a right inguinal hernia and into the scrotum. 3. Areas of colonic and small bowel wall thickening, nonspecific in the setting of ascites. 4. Wall thickening of the distal esophagus, can be seen with reflux or esophagitis. 5. Cardiomegaly with coronary artery calcifications. 6. Dilated main pulmonary artery, can be seen with pulmonary arterial hypertension. 7. Trace left pleural effusion. 8. Question of subtle capsular nodularity of the liver, can be seen with cirrhosis. Mild splenomegaly and tortuous splenic vein likely portal hypertension. Aortic Atherosclerosis (ICD10-I70.0). Electronically Signed   By: Andrea Gasman M.D.   On: 12/25/2023 15:57      Assessment/Plan:  INTERVAL HISTORY: CT chest abdomen and pelvis done   Principal Problem:   Spontaneous bacterial peritonitis (HCC) Active Problems:   Systemic lupus erythematosus with lung involvement (HCC)   Pancytopenia (HCC)   Chronic systolic CHF (congestive heart failure) (HCC)   Acquired hypothyroidism   ESRD on dialysis (HCC)   Ascites   Essential hypertension   Hyperkalemia    [name] is a 53 y.o. male with ascites, thought NOT to be due to cirrhosis but in context of SLE, hypoalbuminemia, with apparent SBP with clinical improvement but worsening WBC on paracentesis and increased LDH  He DID have an SLE flare during this admission  #1 Peritonitis:  IF this elevation in WBC in peritoneum was due to AMP C + from Klebsiella he should be growing the klebsiella--which he is not  CT chest abdomen and pelvis shows moderate ascites, pleural effusion, mild splenomegalh but nothing on imaging to explain why his WBC in peritoneum went up  We are entertaining other causes of peritonitis including from SLE, other reasons for fluctuation of WBC including HD, steroids and  contemplating TB  Plan tomorrow is for US  guided paracentesis with repeat Cell count and diff, LDH, protein, and with NEARLY all of the fluid to be sent for AFB stain and culture and MTB NAAT and culture  Continue cefazolin  in interim   I have personally spent 50 minutes involved in face-to-face and non-face-to-face activities for this patient on the day of the visit. Professional time spent includes the following  activities: Preparing to see the patient (review of tests), Obtaining and/or reviewing separately obtained history (admission/discharge record), Performing a medically appropriate examination and/or evaluation , Ordering medications/tests/procedures, referring and communicating with other health care professionals, Documenting clinical information in the EMR, Independently interpreting results (not separately reported), Communicating results to the patient/family/caregiver, Counseling and educating the patient/family/caregiver and Care coordination (not separately reported).   Evaluation of the patient requires complex antimicrobial therapy evaluation, counseling , isolation needs to reduce disease transmission and risk assessment and mitigation.      LOS: 12 days   Jomarie Fleeta Rothman 12/26/2023, 4:43 PM

## 2023-12-26 NOTE — Progress Notes (Signed)
 Progress Note   Patient: [name] FMW:[id number] DOB: 18-May-1970 DOA: 12/14/2023     12 DOS: the patient was seen and examined on 12/26/2023   Brief hospital course: 53yo with h/o ESRD on TTS HD, SLE, ascites without diagnosis of cirrhosis, and chronic HFrEF who presented on 8/19 with abdominal pain.  Last paracentesis was in 09/2023.  Bedside paracentesis performed with high concern for SBP, cultures positive for Klebsiella, treated with Ceftriaxone  -> Cefazolin  x 14 days.  IR performed paracentesis on 8/20 with 1.8L removed and repeat on 8/26 with 700 cc removed; etiology is thought to be related to severely low albumin .  Developed severe acute metabolic encephalopathy from alcohol withdrawal which has since improved.  Repeat paracentesis ordered.  GI is consulting.  He is recommended for SNF rehab.  Assessment and Plan:  Spontaneous bacterial peritonitis Patient with known h/o ascites but no documented cirrhosis (see below) Initial peritoneal fluid collection noting greater than 3600 WBCs, 95% neutrophils Culture with Klebsiella Transition ceftriaxone  to cefazolin  for total of 14 days   SBP prophylaxis should be continued for those with h/o SBP with either Bactrim  DS or Ciprofloxacin  Repeat paracentesis on 8/26 was not sent for analysis Repeat paracentesis ordered with studies to include cell count/diff, culture, cytology, protein, albumin  Repeat paracentesis fluid appears significantly worse than prior, despite antibiotics; this is incongruous with his clinical presentation, which appears to be significantly improved CT A/P on 8/30 with moderate volume ascites, suspicious for peritonitis with generalized edema and soft tissue thickening, ascites into R scrotum ID consulted and has also added a CT chest with cardiomegaly with coronary artery calcifications, 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 He is recommended to have another paracentesis, scheduled for tomorrow, with basically all of the  fluid (liters, if possible) sent for AFB stage and culture with MTB NAAT as well as adenosine de-aminase   Acute metabolic encephalopathy Likely related to alcohol withdrawal Beginning 8/22 (3 days after presentation) patient appeared very anxious, delirious, paranoid, even hallucinating Discussion with sister - patient does drink to cover pain from his lupus, has reduced his alcohol consumption, reports drinking 1.5 pints of alcohol at a time periodically but denies daily drinking Resolved    Large volume ascites IR paracentesis performed 8/20 with 1.8 L removed Patient having worsening abdominal tightness and tenderness Ordered repeat therapeutic paracentesis 8/26, yielding only Sister reports prior liver biopsy ruling out cirrhosis about 2-3 years ago and performed here but I am not able to appreciate any pathology results from prior liver biopsy done in our system at this time Given his h/o SLE in addition to ETOH consumption, cirrhosis remains probable in the setting of recurrent ascites CT A/P on 8/30 (in addition to ascites as above) showed possible subtle capsular nodularity of the liver which can be seen with cirrhosis and also mild splenomegaly with suggestion of portal HTN GI consulting   Systemic lupus erythematosus with lung involvement Sister is concerned that his presentation is related to SLE Dr. Melia notes that lupus flares are less common in HD patients but still possible Treating with Plaquenil  200 mg PO BID and prednisone  20 mg daily and he is showing improvement He has an ongoing need to follow-up closely as lupus appears to be underlying cause of many of his comorbidities both directly and indirectly Nephrology recommends continuing treatment with Plaquenil  200 mg BID for at least 2 weeks and then decrease to 1 tablet daily for 2 weeks, monitoring for any signs of recurrent activity Nephrology is  slowly tapering the steroids and has decreased from 20 mg daily to 15  mg as of today   Abdominal pain Possibly related to SBP vs. R inguinal hernia with significant R scrotal edema vs. SLE flare vs. 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 intake Discussed at length with patient's sister about need for close follow-up with multiple subspecialists PT/OT consulted and are recommending STR Patient/sister are in agreement with STR at this time He has been offered one bed thus far and patient/family are not satisfied with this option TOC consulted He is likely to be ready for dc by early next week and will need to either accept a bed or plan for dc to home with family, likely             Consultants: Nephrology  IR GI ID PT OT TOC team Dietician   Procedures: Paracentesis, 8/19, 8/20, 8/26, 8/28   Antibiotics: Ceftriaxone  8/19-26 Metronidazole  8/19-22 Cefazolin  8/27-9/7  30 Day Unplanned Readmission Risk Score    Flowsheet Row ED to Hosp-Admission (Current) from 12/14/2023 in [facility]  30 Day Unplanned Readmission Risk Score (%) 19.51 Filed at 12/26/2023 0401    This score  is the patient's risk of an unplanned readmission within 30 days of being discharged (0 -100%). The score is based on dignosis, age, lab data, medications, orders, and past utilization.   Low:  0-14.9   Medium: 15-21.9   High: 22-29.9   Extreme: 30 and above           Subjective: Continues to feel better.  Becoming more independent with ambulation, expects to go home rather than to SNF.  Discussed the importance of strict ETOH cessation.   Objective: Vitals:   12/26/23 0444 12/26/23 0800  BP: 127/80 137/79  Pulse: 66 69  Resp: 18 16  Temp: 98.1 F (36.7 C) 98 F (36.7 C)  SpO2: 98% 99%    Intake/Output Summary (Last 24 hours) at 12/26/2023 1414 Last data filed at 12/25/2023 1603 Gross per 24 hour  Intake 120 ml  Output --  Net 120 ml   Filed Weights   12/21/23 1300 12/25/23 0825 12/25/23 1224  Weight: 63.7 kg 68.7 kg 67.7 kg    Exam:  General:  Appears frail, cachectic, chronically ill, alert, appropriate, markedly improved Eyes:  normal lids, iris ENT:  grossly normal hearing, lips & tongue, mmm Cardiovascular:  RRR. 1+ LE edema.  Respiratory:   CTA bilaterally with no wheezes/rales/rhonchi.  Normal respiratory effort. Abdomen:  moderate ascites, improving diffuse TTP Skin:  no rash or induration seen on limited exam Musculoskeletal:  generally poor strength, no bony abnormality Psychiatric:  more perky mood and affect, speech fluent  and appropriate, AOx3 Neurologic:  no gross abnormalities noted on limited exam  Data Reviewed: I have reviewed the patient's lab results since admission.  Pertinent labs for today include:   Na++ 129, stable BUN 37/Creatinine 4.45/GFR 15 Albumin  1.5 WBC 5 Hgb 9.6, stable Platelets 103, stable Peritoneal fluid culture NTD     Family Communication: Sister was present  Mobility: PT/OT Consulted and are recommending - Skilled Nursing-Short Term Rehab (<3 Hours/Day)12/23/2023 1700    Code Status: Full Code  Barriers to  discharge:  needs repeat large volume paracentesis on 9/1, maybe home after?  Disposition: Status is: Inpatient Remains inpatient appropriate because: ongoing management     Time spent: 50 minutes  Unresulted Labs (From admission, onward)     Start     Ordered   12/27/23 0500  CBC with Differential/Platelet  Tomorrow morning,   R       Question:  Specimen collection method  Answer:  Lab=Lab collect   12/26/23 1414   12/27/23 0500  Comprehensive metabolic panel with GFR  Tomorrow morning,   R       Question:  Specimen collection method  Answer:  Lab=Lab collect   12/26/23 1414   12/26/23 1217  MT-RIF NAA W Cult, Non-Sputum  Once,   R       Comments: This will be actually peritoneal fluid   Question:  Specimen source:  Answer:  Pleural   12/26/23 1217   12/26/23 1216  Acid Fast Smear (AFB)  (AFB smear + Culture w reflexed sensitivities with precautions panel)  Once,   R       Comments: Please use as MUCH as possible from paracentesis, if necessary centrifuge down liters of fluid to send for AFB culture   Placed in And Linked Group   12/26/23 1217   12/26/23 1216  Acid Fast Culture with reflexed sensitivities  (AFB smear + Culture w reflexed sensitivities with precautions panel)  Once,   R       Comments: Please use as MUCH as possible from paracentesis, if necessary centrifuge down liters of fluid to send for AFB culture   Placed in And Linked Group   12/26/23 1217             Author: Delon Herald, MD 12/26/2023 2:14 PM  For on call review [email or web address].

## 2023-12-27 ENCOUNTER — Inpatient Hospital Stay (HOSPITAL_COMMUNITY)

## 2023-12-27 DIAGNOSIS — K652 Spontaneous bacterial peritonitis: Secondary | ICD-10-CM | POA: Diagnosis not present

## 2023-12-27 LAB — BODY FLUID CELL COUNT WITH DIFFERENTIAL
Eos, Fluid: 0 %
Lymphs, Fluid: 3 %
Monocyte-Macrophage-Serous Fluid: 4 % — ABNORMAL LOW (ref 50–90)
Neutrophil Count, Fluid: 93 % — ABNORMAL HIGH (ref 0–25)
Total Nucleated Cell Count, Fluid: 57750 uL — ABNORMAL HIGH (ref 0–1000)

## 2023-12-27 LAB — CBC WITH DIFFERENTIAL/PLATELET
Abs Immature Granulocytes: 0.2 K/uL — ABNORMAL HIGH (ref 0.00–0.07)
Basophils Absolute: 0 K/uL (ref 0.0–0.1)
Basophils Relative: 1 %
Eosinophils Absolute: 0 K/uL (ref 0.0–0.5)
Eosinophils Relative: 1 %
HCT: 31.1 % — ABNORMAL LOW (ref 39.0–52.0)
Hemoglobin: 9.9 g/dL — ABNORMAL LOW (ref 13.0–17.0)
Immature Granulocytes: 5 %
Lymphocytes Relative: 12 %
Lymphs Abs: 0.5 K/uL — ABNORMAL LOW (ref 0.7–4.0)
MCH: 27.5 pg (ref 26.0–34.0)
MCHC: 31.8 g/dL (ref 30.0–36.0)
MCV: 86.4 fL (ref 80.0–100.0)
Monocytes Absolute: 0.3 K/uL (ref 0.1–1.0)
Monocytes Relative: 7 %
Neutro Abs: 3.2 K/uL (ref 1.7–7.7)
Neutrophils Relative %: 74 %
Platelets: 115 K/uL — ABNORMAL LOW (ref 150–400)
RBC: 3.6 MIL/uL — ABNORMAL LOW (ref 4.22–5.81)
RDW: 17 % — ABNORMAL HIGH (ref 11.5–15.5)
WBC: 4.2 K/uL (ref 4.0–10.5)
nRBC: 0 % (ref 0.0–0.2)

## 2023-12-27 LAB — COMPREHENSIVE METABOLIC PANEL WITH GFR
ALT: 6 U/L (ref 0–44)
AST: 32 U/L (ref 15–41)
Albumin: 1.6 g/dL — ABNORMAL LOW (ref 3.5–5.0)
Alkaline Phosphatase: 83 U/L (ref 38–126)
Anion gap: 10 (ref 5–15)
BUN: 49 mg/dL — ABNORMAL HIGH (ref 6–20)
CO2: 27 mmol/L (ref 22–32)
Calcium: 7.8 mg/dL — ABNORMAL LOW (ref 8.9–10.3)
Chloride: 92 mmol/L — ABNORMAL LOW (ref 98–111)
Creatinine, Ser: 5.6 mg/dL — ABNORMAL HIGH (ref 0.61–1.24)
GFR, Estimated: 11 mL/min — ABNORMAL LOW (ref >60)
Glucose, Bld: 90 mg/dL (ref 70–99)
Potassium: 5.3 mmol/L — ABNORMAL HIGH (ref 3.5–5.1)
Sodium: 129 mmol/L — ABNORMAL LOW (ref 135–145)
Total Bilirubin: 0.7 mg/dL (ref 0.0–1.2)
Total Protein: 6.3 g/dL — ABNORMAL LOW (ref 6.5–8.1)

## 2023-12-27 LAB — CRYPTOCOCCAL ANTIGEN: Crypto Ag: NEGATIVE

## 2023-12-27 LAB — BODY FLUID CULTURE W GRAM STAIN: Culture: NO GROWTH

## 2023-12-27 LAB — LACTATE DEHYDROGENASE, PLEURAL OR PERITONEAL FLUID: LD, Fluid: >2500 U/L — ABNORMAL HIGH (ref 3–23)

## 2023-12-27 MED ORDER — ALUM & MAG HYDROXIDE-SIMETH 200-200-20 MG/5ML PO SUSP
30.0000 mL | Freq: Once | ORAL | Status: AC
  Administered 2023-12-27: 30 mL via ORAL
  Filled 2023-12-27: qty 30

## 2023-12-27 MED ORDER — PANTOPRAZOLE SODIUM 40 MG PO TBEC
40.0000 mg | DELAYED_RELEASE_TABLET | Freq: Every day | ORAL | Status: DC
  Administered 2023-12-27 – 2024-01-02 (×6): 40 mg via ORAL
  Filled 2023-12-27 (×8): qty 1

## 2023-12-27 MED ORDER — CALCIUM CARBONATE ANTACID 500 MG PO CHEW
1.0000 | CHEWABLE_TABLET | Freq: Once | ORAL | Status: AC | PRN
Start: 2023-12-27 — End: 2023-12-27
  Administered 2023-12-27: 200 mg via ORAL
  Filled 2023-12-27: qty 1

## 2023-12-27 MED ORDER — ALUM & MAG HYDROXIDE-SIMETH 200-200-20 MG/5ML PO SUSP: 30.0000 mL | ORAL | Status: DC | PRN

## 2023-12-27 MED ORDER — LIDOCAINE HCL (PF) 1 % IJ SOLN
7.0000 mL | Freq: Once | INTRAMUSCULAR | Status: AC
  Administered 2023-12-27: 7 mL via INTRADERMAL

## 2023-12-27 NOTE — Procedures (Signed)
 PROCEDURE SUMMARY:  Successful image-guided paracentesis from the left lower abdomen.  Yielded 920 mL liters of hazy tan fluid.  No immediate complications.  EBL: trace Patient tolerated well.   Specimen was sent for labs.  Please see imaging section of Epic for full dictation.  Kimble DEL Irys Nigh PA-C 12/27/2023 11:02 AM

## 2023-12-27 NOTE — Progress Notes (Signed)
 [name] Progress Note   Subjective:  Seen in room - feeling improved, for LVP today with repeat cultures to r/o TB. No dyspnea. For HD tomorrow.   Objective Vitals:   12/26/23 1707 12/26/23 2040 12/27/23 0408 12/27/23 0912  BP: (!) 141/83 138/79 (!) 149/79   Pulse: 66 66 65   Resp: 18 18 18    Temp: 98.2 F (36.8 C) 97.9 F (36.6 C) (!) 97.4 F (36.3 C)   TempSrc: Oral Oral    SpO2: 99% 99% 97%   Weight:    66.9 kg  Height:       Physical Exam General: Chronically ill appearing, NAD. Room air Heart: RRR; no murmur Lungs: CTA anteriorly Abdomen: distended, mild TTP Extremities: trace BLE edema Dialysis Access: LUE AVF +t/b    Additional Objective Labs: Basic Metabolic Panel: Recent Labs  Lab 12/22/23 0441 12/23/23 0605 12/25/23 0854 12/26/23 0727 12/27/23 0508  NA 130*   < > 128* 129* 129*  K 4.2   < > 5.2* 4.7 5.3*  CL 94*   < > 92* 94* 92*  CO2 26   < > 26 25 27   GLUCOSE 129*   < > 120* 83 90  BUN 34*   < > 53* 37* 49*  CREATININE 5.03*   < > 6.04* 4.45* 5.60*  CALCIUM  7.6*   < > 7.7* 7.5* 7.8*  PHOS 4.6  --  6.2*  --   --    < > = values in this interval not displayed.   Liver Function Tests: Recent Labs  Lab 12/24/23 0414 12/25/23 0854 12/26/23 0727 12/27/23 0508  AST 28  --  36 32  ALT 10  --  6 6  ALKPHOS 89  --  92 83  BILITOT 0.6  --  0.4 0.7  PROT 6.6  --  6.2* 6.3*  ALBUMIN  1.6* 1.5* 1.5* 1.6*   CBC: Recent Labs  Lab 12/23/23 0605 12/24/23 0414 12/25/23 0853 12/26/23 0727 12/27/23 0508  WBC 8.5 6.5 5.8 5.0 4.2  NEUTROABS 6.4 5.3  --  4.6 3.2  HGB 10.9* 10.4* 9.2* 9.6* 9.9*  HCT 33.9* 32.6* 29.1* 30.7* 31.1*  MCV 86.9 87.6 87.4 88.7 86.4  PLT 92* 88* 100* 103* 115*   Studies/Results: CT ABDOMEN PELVIS W CONTRAST Result Date: 12/25/2023 CLINICAL DATA:  Provided history: Ascites, fever of unknown origin. EXAM: CT CHEST, ABDOMEN, AND PELVIS WITH CONTRAST TECHNIQUE: Multidetector CT imaging of the chest, abdomen  and pelvis was performed following the standard protocol during bolus administration of intravenous contrast. RADIATION DOSE REDUCTION: This exam was performed according to the departmental dose-optimization program which includes automated exposure control, adjustment of the mA and/or kV according to patient size and/or use of iterative reconstruction technique. CONTRAST:  75mL OMNIPAQUE  IOHEXOL  350 MG/ML SOLN COMPARISON:  Abdominopelvic CT 12/14/2023 FINDINGS: CT CHEST FINDINGS Cardiovascular: The heart is enlarged. Coronary artery and mitral annulus calcifications. Trace pericardial effusion. Mild aortic atherosclerosis without aneurysm or acute aortic findings. Main pulmonary artery is dilated at 3.8 cm. No obvious central pulmonary embolus on this exam not tailored to pulmonary artery assessment. Dilated vasculature in the left upper extremity, likely related to dialysis access. Mediastinum/Nodes: No mediastinal, hilar, or axillary adenopathy. No visible thyroid  nodule. Mild wall thickening of the distal esophagus. Lungs/Pleura: Mild biapical pleuroparenchymal scarring. Trace dependent subsegmental atelectasis in the lower lobes. Trace left pleural effusion. No confluent airspace disease or evidence of pneumonia. No features of pulmonary edema. No endobronchial lesion or debris. Musculoskeletal:  There are no acute or suspicious osseous abnormalities. CT ABDOMEN PELVIS FINDINGS Hepatobiliary: No focal liver lesion. Question of subtle capsular nodularity of the liver. Layering density in the gallbladder was not seen on prior exam and may represent vicarious excretion of contrast. No biliary dilatation. Pancreas: No ductal dilatation or inflammation. Spleen: Mildly enlarged, 13.8 cm AP. Heterogeneous enhancement felt to be due to phase of contrast. Adrenals/Urinary Tract: No adrenal nodule. Bilateral renal parenchymal atrophy consistent with chronic renal disease. No hydronephrosis or suspicious renal lesion.  Absent renal excretion again demonstrated on delayed phase. The urinary bladder is decompressed. Stomach/Bowel: Detailed bowel assessment is limited in the absence of enteric contrast and presence of ascites. Wall thickening of the distal esophagus. Equivocal pre pyloric gastric wall thickening. Diffuse small bowel wall thickening which is nonspecific in the setting of ascites. There is scattered fluid-filled and mildly dilated loops of small bowel. Normal appendix. Moderate formed stool in the colon. Areas of colonic wall thickening involving the ascending and transverse colon. Left colonic diverticulosis without focal diverticulitis. No bowel pneumatosis. Vascular/Lymphatic: Aortic atherosclerosis without aneurysm. Patent portal vein. Tortuous splenic vein likely portal hypertension. Scattered small retroperitoneal nodes are unchanged over the last 11 days. Reproductive: Prostate is unremarkable.  Right scrotal hydrocele. Other: Moderate volume ascites again demonstrated. There is increasing peritoneal thickening and enhancement about ascites particularly in the right abdomen and pelvis. Moderate to large volume ascites with enhancement tracks into a right inguinal hernia extending into the scrotum. There is generalized edema and soft tissue thickening of the omentum. No free intra-abdominal air. Musculoskeletal: There are no acute or suspicious osseous abnormalities. Chronic bilateral femoral head avascular necrosis. IMPRESSION: 1. Moderate volume ascites. Increasing peritoneal thickening and enhancement about ascites particularly in the right abdomen and pelvis. Findings are suspicious for peritonitis. Generalized edema and soft tissue thickening of the omentum, nonspecific in the setting of ascites. 2. Ascites with enhancement extends into a right inguinal hernia and into the scrotum. 3. Areas of colonic and small bowel wall thickening, nonspecific in the setting of ascites. 4. Wall thickening of the distal  esophagus, can be seen with reflux or esophagitis. 5. Cardiomegaly with coronary artery calcifications. 6. Dilated main pulmonary artery, can be seen with pulmonary arterial hypertension. 7. Trace left pleural effusion. 8. Question of subtle capsular nodularity of the liver, can be seen with cirrhosis. Mild splenomegaly and tortuous splenic vein likely portal hypertension. Aortic Atherosclerosis (ICD10-I70.0). Electronically Signed   By: Andrea Gasman M.D.   On: 12/25/2023 15:57   CT CHEST W CONTRAST Result Date: 12/25/2023 CLINICAL DATA:  Provided history: Ascites, fever of unknown origin. EXAM: CT CHEST, ABDOMEN, AND PELVIS WITH CONTRAST TECHNIQUE: Multidetector CT imaging of the chest, abdomen and pelvis was performed following the standard protocol during bolus administration of intravenous contrast. RADIATION DOSE REDUCTION: This exam was performed according to the departmental dose-optimization program which includes automated exposure control, adjustment of the mA and/or kV according to patient size and/or use of iterative reconstruction technique. CONTRAST:  75mL OMNIPAQUE  IOHEXOL  350 MG/ML SOLN COMPARISON:  Abdominopelvic CT 12/14/2023 FINDINGS: CT CHEST FINDINGS Cardiovascular: The heart is enlarged. Coronary artery and mitral annulus calcifications. Trace pericardial effusion. Mild aortic atherosclerosis without aneurysm or acute aortic findings. Main pulmonary artery is dilated at 3.8 cm. No obvious central pulmonary embolus on this exam not tailored to pulmonary artery assessment. Dilated vasculature in the left upper extremity, likely related to dialysis access. Mediastinum/Nodes: No mediastinal, hilar, or axillary adenopathy. No visible thyroid  nodule. Mild wall  thickening of the distal esophagus. Lungs/Pleura: Mild biapical pleuroparenchymal scarring. Trace dependent subsegmental atelectasis in the lower lobes. Trace left pleural effusion. No confluent airspace disease or evidence of pneumonia.  No features of pulmonary edema. No endobronchial lesion or debris. Musculoskeletal: There are no acute or suspicious osseous abnormalities. CT ABDOMEN PELVIS FINDINGS Hepatobiliary: No focal liver lesion. Question of subtle capsular nodularity of the liver. Layering density in the gallbladder was not seen on prior exam and may represent vicarious excretion of contrast. No biliary dilatation. Pancreas: No ductal dilatation or inflammation. Spleen: Mildly enlarged, 13.8 cm AP. Heterogeneous enhancement felt to be due to phase of contrast. Adrenals/Urinary Tract: No adrenal nodule. Bilateral renal parenchymal atrophy consistent with chronic renal disease. No hydronephrosis or suspicious renal lesion. Absent renal excretion again demonstrated on delayed phase. The urinary bladder is decompressed. Stomach/Bowel: Detailed bowel assessment is limited in the absence of enteric contrast and presence of ascites. Wall thickening of the distal esophagus. Equivocal pre pyloric gastric wall thickening. Diffuse small bowel wall thickening which is nonspecific in the setting of ascites. There is scattered fluid-filled and mildly dilated loops of small bowel. Normal appendix. Moderate formed stool in the colon. Areas of colonic wall thickening involving the ascending and transverse colon. Left colonic diverticulosis without focal diverticulitis. No bowel pneumatosis. Vascular/Lymphatic: Aortic atherosclerosis without aneurysm. Patent portal vein. Tortuous splenic vein likely portal hypertension. Scattered small retroperitoneal nodes are unchanged over the last 11 days. Reproductive: Prostate is unremarkable.  Right scrotal hydrocele. Other: Moderate volume ascites again demonstrated. There is increasing peritoneal thickening and enhancement about ascites particularly in the right abdomen and pelvis. Moderate to large volume ascites with enhancement tracks into a right inguinal hernia extending into the scrotum. There is generalized  edema and soft tissue thickening of the omentum. No free intra-abdominal air. Musculoskeletal: There are no acute or suspicious osseous abnormalities. Chronic bilateral femoral head avascular necrosis. IMPRESSION: 1. Moderate volume ascites. Increasing peritoneal thickening and enhancement about ascites particularly in the right abdomen and pelvis. Findings are suspicious for peritonitis. Generalized edema and soft tissue thickening of the omentum, nonspecific in the setting of ascites. 2. Ascites with enhancement extends into a right inguinal hernia and into the scrotum. 3. Areas of colonic and small bowel wall thickening, nonspecific in the setting of ascites. 4. Wall thickening of the distal esophagus, can be seen with reflux or esophagitis. 5. Cardiomegaly with coronary artery calcifications. 6. Dilated main pulmonary artery, can be seen with pulmonary arterial hypertension. 7. Trace left pleural effusion. 8. Question of subtle capsular nodularity of the liver, can be seen with cirrhosis. Mild splenomegaly and tortuous splenic vein likely portal hypertension. Aortic Atherosclerosis (ICD10-I70.0). Electronically Signed   By: Andrea Gasman M.D.   On: 12/25/2023 15:57   Medications:   ceFAZolin  (ANCEF ) IV 1 g (12/26/23 1151)    carvedilol   25 mg Oral BID   Chlorhexidine  Gluconate Cloth  6 each Topical Q0600   docusate sodium   100 mg Oral BID   doxercalciferol   2 mcg Intravenous Q T,Th,Sa-HD   feeding supplement (NEPRO CARB STEADY)  237 mL Oral Q24H   folic acid   1 mg Oral Daily   hydroxychloroquine   200 mg Oral BID   levothyroxine   50 mcg Oral QAC breakfast   lidocaine -EPINEPHrine   10 mL Intradermal Once   lidocaine -EPINEPHrine   30 mL Infiltration Once   multivitamin  1 tablet Oral QHS   polyethylene glycol  17 g Oral Daily   predniSONE   15 mg Oral Q breakfast  thiamine   100 mg Oral Daily   Or   thiamine   100 mg Intravenous Daily   Dialysis Orders TTS - AF 3:45hr, 400/A1.5, EDW 71kg,  2K/2C bath. AVF, no heparin  - Mircera 150mcg IV q 2 weeks (last 8/12) - Hectoral 2mcg IV q HD   Assessment/Plan: SBP: Fluid Cx + Klebsiella, on course of Cefazolin . Symptoms much improved, btu repeat Cx with higher cell count. ID involved -> plan is for another paracentesis/testing for TB. AMS: Infection v. alcohol withdrawal, per primary. ESRD: Continue HD on TTS schedule - next HD 9/2. HTN/volume: BP stable now - ARB and hydralazine d/c'd - remains on BB, UF as tolerated. Anemia of ESRD: Hgb 9.9 - follow labs. Secondary HPTH: CorrCa/Phos ok - resumed hectoral q HD, not getting binders here Nutrition: Alb very low, continue protein supps. SLE: C3/4 low, dsDNA high - c/w flare - on plaquenil  and prednisone  taper - feels better. Recurrent ascites: Gets intermittent paracenteses since 2022.   Izetta Boehringer, PA-C 12/27/2023, 10:13 AM  [employer]

## 2023-12-27 NOTE — Progress Notes (Signed)
 Subjective: He is complaining about abdominal pain from his ascites.  Objective: Vital signs in last 24 hours: Temp:  [97.4 F (36.3 C)-98.2 F (36.8 C)] 97.4 F (36.3 C) (09/01 0408) Pulse Rate:  [65-66] 65 (09/01 0408) Resp:  [18] 18 (09/01 0408) BP: (138-149)/(79-83) 149/79 (09/01 0408) SpO2:  [97 %-99 %] 97 % (09/01 0408) Last BM Date : 12/26/23  Intake/Output from previous day: 08/31 0701 - 09/01 0700 In: 480 [P.O.:480] Out: 0  Intake/Output this shift: No intake/output data recorded.  General appearance: alert and mild distress GI: distended, but not firm  Lab Results: Recent Labs    12/25/23 0853 12/26/23 0727 12/27/23 0508  WBC 5.8 5.0 4.2  HGB 9.2* 9.6* 9.9*  HCT 29.1* 30.7* 31.1*  PLT 100* 103* 115*   BMET Recent Labs    12/25/23 0854 12/26/23 0727 12/27/23 0508  NA 128* 129* 129*  K 5.2* 4.7 5.3*  CL 92* 94* 92*  CO2 26 25 27   GLUCOSE 120* 83 90  BUN 53* 37* 49*  CREATININE 6.04* 4.45* 5.60*  CALCIUM  7.7* 7.5* 7.8*   LFT Recent Labs    12/27/23 0508  PROT 6.3*  ALBUMIN  1.6*  AST 32  ALT 6  ALKPHOS 83  BILITOT 0.7   PT/INR No results for input(s): LABPROT, INR in the last 72 hours. Hepatitis Panel No results for input(s): HEPBSAG, HCVAB, HEPAIGM, HEPBIGM in the last 72 hours. C-Diff No results for input(s): CDIFFTOX in the last 72 hours. Fecal Lactopherrin No results for input(s): FECLLACTOFRN in the last 72 hours.  Studies/Results: CT ABDOMEN PELVIS W CONTRAST Result Date: 12/25/2023 CLINICAL DATA:  Provided history: Ascites, fever of unknown origin. EXAM: CT CHEST, ABDOMEN, AND PELVIS WITH CONTRAST TECHNIQUE: Multidetector CT imaging of the chest, abdomen and pelvis was performed following the standard protocol during bolus administration of intravenous contrast. RADIATION DOSE REDUCTION: This exam was performed according to the departmental dose-optimization program which includes automated exposure control,  adjustment of the mA and/or kV according to patient size and/or use of iterative reconstruction technique. CONTRAST:  75mL OMNIPAQUE  IOHEXOL  350 MG/ML SOLN COMPARISON:  Abdominopelvic CT 12/14/2023 FINDINGS: CT CHEST FINDINGS Cardiovascular: The heart is enlarged. Coronary artery and mitral annulus calcifications. Trace pericardial effusion. Mild aortic atherosclerosis without aneurysm or acute aortic findings. Main pulmonary artery is dilated at 3.8 cm. No obvious central pulmonary embolus on this exam not tailored to pulmonary artery assessment. Dilated vasculature in the left upper extremity, likely related to dialysis access. Mediastinum/Nodes: No mediastinal, hilar, or axillary adenopathy. No visible thyroid  nodule. Mild wall thickening of the distal esophagus. Lungs/Pleura: Mild biapical pleuroparenchymal scarring. Trace dependent subsegmental atelectasis in the lower lobes. Trace left pleural effusion. No confluent airspace disease or evidence of pneumonia. No features of pulmonary edema. No endobronchial lesion or debris. Musculoskeletal: There are no acute or suspicious osseous abnormalities. CT ABDOMEN PELVIS FINDINGS Hepatobiliary: No focal liver lesion. Question of subtle capsular nodularity of the liver. Layering density in the gallbladder was not seen on prior exam and may represent vicarious excretion of contrast. No biliary dilatation. Pancreas: No ductal dilatation or inflammation. Spleen: Mildly enlarged, 13.8 cm AP. Heterogeneous enhancement felt to be due to phase of contrast. Adrenals/Urinary Tract: No adrenal nodule. Bilateral renal parenchymal atrophy consistent with chronic renal disease. No hydronephrosis or suspicious renal lesion. Absent renal excretion again demonstrated on delayed phase. The urinary bladder is decompressed. Stomach/Bowel: Detailed bowel assessment is limited in the absence of enteric contrast and presence of ascites. Wall thickening  of the distal esophagus. Equivocal pre  pyloric gastric wall thickening. Diffuse small bowel wall thickening which is nonspecific in the setting of ascites. There is scattered fluid-filled and mildly dilated loops of small bowel. Normal appendix. Moderate formed stool in the colon. Areas of colonic wall thickening involving the ascending and transverse colon. Left colonic diverticulosis without focal diverticulitis. No bowel pneumatosis. Vascular/Lymphatic: Aortic atherosclerosis without aneurysm. Patent portal vein. Tortuous splenic vein likely portal hypertension. Scattered small retroperitoneal nodes are unchanged over the last 11 days. Reproductive: Prostate is unremarkable.  Right scrotal hydrocele. Other: Moderate volume ascites again demonstrated. There is increasing peritoneal thickening and enhancement about ascites particularly in the right abdomen and pelvis. Moderate to large volume ascites with enhancement tracks into a right inguinal hernia extending into the scrotum. There is generalized edema and soft tissue thickening of the omentum. No free intra-abdominal air. Musculoskeletal: There are no acute or suspicious osseous abnormalities. Chronic bilateral femoral head avascular necrosis. IMPRESSION: 1. Moderate volume ascites. Increasing peritoneal thickening and enhancement about ascites particularly in the right abdomen and pelvis. Findings are suspicious for peritonitis. Generalized edema and soft tissue thickening of the omentum, nonspecific in the setting of ascites. 2. Ascites with enhancement extends into a right inguinal hernia and into the scrotum. 3. Areas of colonic and small bowel wall thickening, nonspecific in the setting of ascites. 4. Wall thickening of the distal esophagus, can be seen with reflux or esophagitis. 5. Cardiomegaly with coronary artery calcifications. 6. Dilated main pulmonary artery, can be seen with pulmonary arterial hypertension. 7. Trace left pleural effusion. 8. Question of subtle capsular nodularity of  the liver, can be seen with cirrhosis. Mild splenomegaly and tortuous splenic vein likely portal hypertension. Aortic Atherosclerosis (ICD10-I70.0). Electronically Signed   By: Andrea Gasman M.D.   On: 12/25/2023 15:57   CT CHEST W CONTRAST Result Date: 12/25/2023 CLINICAL DATA:  Provided history: Ascites, fever of unknown origin. EXAM: CT CHEST, ABDOMEN, AND PELVIS WITH CONTRAST TECHNIQUE: Multidetector CT imaging of the chest, abdomen and pelvis was performed following the standard protocol during bolus administration of intravenous contrast. RADIATION DOSE REDUCTION: This exam was performed according to the departmental dose-optimization program which includes automated exposure control, adjustment of the mA and/or kV according to patient size and/or use of iterative reconstruction technique. CONTRAST:  75mL OMNIPAQUE  IOHEXOL  350 MG/ML SOLN COMPARISON:  Abdominopelvic CT 12/14/2023 FINDINGS: CT CHEST FINDINGS Cardiovascular: The heart is enlarged. Coronary artery and mitral annulus calcifications. Trace pericardial effusion. Mild aortic atherosclerosis without aneurysm or acute aortic findings. Main pulmonary artery is dilated at 3.8 cm. No obvious central pulmonary embolus on this exam not tailored to pulmonary artery assessment. Dilated vasculature in the left upper extremity, likely related to dialysis access. Mediastinum/Nodes: No mediastinal, hilar, or axillary adenopathy. No visible thyroid  nodule. Mild wall thickening of the distal esophagus. Lungs/Pleura: Mild biapical pleuroparenchymal scarring. Trace dependent subsegmental atelectasis in the lower lobes. Trace left pleural effusion. No confluent airspace disease or evidence of pneumonia. No features of pulmonary edema. No endobronchial lesion or debris. Musculoskeletal: There are no acute or suspicious osseous abnormalities. CT ABDOMEN PELVIS FINDINGS Hepatobiliary: No focal liver lesion. Question of subtle capsular nodularity of the liver.  Layering density in the gallbladder was not seen on prior exam and may represent vicarious excretion of contrast. No biliary dilatation. Pancreas: No ductal dilatation or inflammation. Spleen: Mildly enlarged, 13.8 cm AP. Heterogeneous enhancement felt to be due to phase of contrast. Adrenals/Urinary Tract: No adrenal nodule. Bilateral renal parenchymal atrophy  consistent with chronic renal disease. No hydronephrosis or suspicious renal lesion. Absent renal excretion again demonstrated on delayed phase. The urinary bladder is decompressed. Stomach/Bowel: Detailed bowel assessment is limited in the absence of enteric contrast and presence of ascites. Wall thickening of the distal esophagus. Equivocal pre pyloric gastric wall thickening. Diffuse small bowel wall thickening which is nonspecific in the setting of ascites. There is scattered fluid-filled and mildly dilated loops of small bowel. Normal appendix. Moderate formed stool in the colon. Areas of colonic wall thickening involving the ascending and transverse colon. Left colonic diverticulosis without focal diverticulitis. No bowel pneumatosis. Vascular/Lymphatic: Aortic atherosclerosis without aneurysm. Patent portal vein. Tortuous splenic vein likely portal hypertension. Scattered small retroperitoneal nodes are unchanged over the last 11 days. Reproductive: Prostate is unremarkable.  Right scrotal hydrocele. Other: Moderate volume ascites again demonstrated. There is increasing peritoneal thickening and enhancement about ascites particularly in the right abdomen and pelvis. Moderate to large volume ascites with enhancement tracks into a right inguinal hernia extending into the scrotum. There is generalized edema and soft tissue thickening of the omentum. No free intra-abdominal air. Musculoskeletal: There are no acute or suspicious osseous abnormalities. Chronic bilateral femoral head avascular necrosis. IMPRESSION: 1. Moderate volume ascites. Increasing  peritoneal thickening and enhancement about ascites particularly in the right abdomen and pelvis. Findings are suspicious for peritonitis. Generalized edema and soft tissue thickening of the omentum, nonspecific in the setting of ascites. 2. Ascites with enhancement extends into a right inguinal hernia and into the scrotum. 3. Areas of colonic and small bowel wall thickening, nonspecific in the setting of ascites. 4. Wall thickening of the distal esophagus, can be seen with reflux or esophagitis. 5. Cardiomegaly with coronary artery calcifications. 6. Dilated main pulmonary artery, can be seen with pulmonary arterial hypertension. 7. Trace left pleural effusion. 8. Question of subtle capsular nodularity of the liver, can be seen with cirrhosis. Mild splenomegaly and tortuous splenic vein likely portal hypertension. Aortic Atherosclerosis (ICD10-I70.0). Electronically Signed   By: Andrea Gasman M.D.   On: 12/25/2023 15:57    Medications: Scheduled:  carvedilol   25 mg Oral BID   Chlorhexidine  Gluconate Cloth  6 each Topical Q0600   docusate sodium   100 mg Oral BID   doxercalciferol   2 mcg Intravenous Q T,Th,Sa-HD   feeding supplement (NEPRO CARB STEADY)  237 mL Oral Q24H   folic acid   1 mg Oral Daily   hydroxychloroquine   200 mg Oral BID   levothyroxine   50 mcg Oral QAC breakfast   lidocaine -EPINEPHrine   10 mL Intradermal Once   lidocaine -EPINEPHrine   30 mL Infiltration Once   multivitamin  1 tablet Oral QHS   polyethylene glycol  17 g Oral Daily   predniSONE   15 mg Oral Q breakfast   thiamine   100 mg Oral Daily   Or   thiamine   100 mg Intravenous Daily   Continuous:   ceFAZolin  (ANCEF ) IV 1 g (12/26/23 1151)    Assessment/Plan: 1) SBP - K pneumonia. 2) Worsening ascites. 3) Possible ETOH cirrhosis. 4) ESRD. 5) SLE.   The patient is uncomfortable today with the abdominal examination.  Subjectively it does seem that his ascites is worse.  There was no weight since 8/30 when he was at  67 kg.  Regardless, he is scheduled for a repeat paracentesis today.  Plan: 1) Await the results of the repeat paracentesis. 2) Continue with cefazolin . 3) Maintain prednisone  and hydroxycholoroquine for his SLE.  LOS: 13 days   [name] D 12/27/2023, 8:29  AM

## 2023-12-27 NOTE — Progress Notes (Signed)
 Subjective:  Patient is status post paracentesis   Antibiotics:  Anti-infectives (From admission, onward)    Start     Dose/Rate Route Frequency Ordered Stop   12/22/23 1400  hydroxychloroquine  (PLAQUENIL ) tablet 200 mg        200 mg Oral 2 times daily 12/22/23 1252     12/22/23 1200  ceFAZolin  (ANCEF ) IVPB 1 g/50 mL premix        1 g 100 mL/hr over 30 Minutes Intravenous Every 24 hours 12/21/23 1039 01/02/24 1159   12/15/23 0800  cefTRIAXone  (ROCEPHIN ) 2 g in sodium chloride  0.9 % 100 mL IVPB  Status:  Discontinued        2 g 200 mL/hr over 30 Minutes Intravenous Every 24 hours 12/14/23 1740 12/21/23 1039   12/14/23 1745  metroNIDAZOLE  (FLAGYL ) IVPB 500 mg  Status:  Discontinued        500 mg 100 mL/hr over 60 Minutes Intravenous Every 12 hours 12/14/23 1740 12/17/23 1138   12/14/23 1545  cefTRIAXone  (ROCEPHIN ) 2 g in sodium chloride  0.9 % 100 mL IVPB        2 g 200 mL/hr over 30 Minutes Intravenous  Once 12/14/23 1534 12/14/23 1649       Medications: Scheduled Meds:  carvedilol   25 mg Oral BID   Chlorhexidine  Gluconate Cloth  6 each Topical Q0600   docusate sodium   100 mg Oral BID   doxercalciferol   2 mcg Intravenous Q T,Th,Sa-HD   feeding supplement (NEPRO CARB STEADY)  237 mL Oral Q24H   folic acid   1 mg Oral Daily   hydroxychloroquine   200 mg Oral BID   levothyroxine   50 mcg Oral QAC breakfast   lidocaine -EPINEPHrine   10 mL Intradermal Once   lidocaine -EPINEPHrine   30 mL Infiltration Once   multivitamin  1 tablet Oral QHS   pantoprazole   40 mg Oral Daily   polyethylene glycol  17 g Oral Daily   predniSONE   15 mg Oral Q breakfast   thiamine   100 mg Oral Daily   Or   thiamine   100 mg Intravenous Daily   Continuous Infusions:   ceFAZolin  (ANCEF ) IV 1 g (12/27/23 1145)   PRN Meds:.acetaminophen  **OR** [DISCONTINUED] acetaminophen , albuterol , bisacodyl , bisacodyl , melatonin, menthol -cetylpyridinium, ondansetron  **OR** ondansetron  (ZOFRAN ) IV, mouth  rinse, oxyCODONE     Objective: Weight change:   Intake/Output Summary (Last 24 hours) at 12/27/2023 1334 Last data filed at 12/26/2023 1800 Gross per 24 hour  Intake 120 ml  Output --  Net 120 ml   Blood pressure (!) 149/79, pulse 65, temperature (!) 97.4 F (36.3 C), resp. rate 18, height 5' 11 (1.803 m), weight 66 kg, SpO2 97%. Temp:  [97.4 F (36.3 C)-98.2 F (36.8 C)] 97.4 F (36.3 C) (09/01 0408) Pulse Rate:  [65-66] 65 (09/01 0408) Resp:  [18] 18 (09/01 0408) BP: (138-149)/(79-83) 149/79 (09/01 0408) SpO2:  [97 %-99 %] 97 % (09/01 0408) Weight:  [66 kg-66.9 kg] 66 kg (09/01 1025)  Physical Exam: Physical Exam Constitutional:      Appearance: [name] is well-developed.  HENT:     Head: Normocephalic and atraumatic.  Eyes:     Conjunctiva/sclera: Conjunctivae normal.  Cardiovascular:     Rate and Rhythm: Normal rate and regular rhythm.  Pulmonary:     Effort: Pulmonary effort is normal. No respiratory distress.     Breath sounds: No wheezing.  Abdominal:     General: There is no distension.     Palpations: Abdomen is  soft.  Musculoskeletal:        General: Normal range of motion.     Cervical back: Normal range of motion and neck supple.  Skin:    General: Skin is warm and dry.     Findings: No erythema or rash.  Neurological:     General: No focal deficit present.     Mental Status: [name] is alert and oriented to person, place, and time.  Psychiatric:        Mood and Affect: Mood normal.        Behavior: Behavior normal.        Thought Content: Thought content normal.        Judgment: Judgment normal.      CBC:    BMET Recent Labs    12/26/23 0727 12/27/23 0508  NA 129* 129*  K 4.7 5.3*  CL 94* 92*  CO2 25 27  GLUCOSE 83 90  BUN 37* 49*  CREATININE 4.45* 5.60*  CALCIUM  7.5* 7.8*     Liver Panel  Recent Labs    12/26/23 0727 12/27/23 0508  PROT 6.2* 6.3*  ALBUMIN  1.5* 1.6*  AST 36 32  ALT 6 6  ALKPHOS 92 83  BILITOT 0.4 0.7        Sedimentation Rate No results for input(s): ESRSEDRATE in the last 72 hours. C-Reactive Protein No results for input(s): CRP in the last 72 hours.  Micro Results: Recent Results (from the past 720 hours)  Body fluid culture w Gram Stain     Status: Abnormal   Collection Time: 12/14/23  3:02 PM   Specimen: Peritoneal Washings; Body Fluid  Result Value Ref Range Status   Specimen Description PERITONEAL  Final   Special Requests NONE  Final   Gram Stain   Final    FEW WBC PRESENT, PREDOMINANTLY PMN ABUNDANT GRAM NEGATIVE RODS Gram Stain Report Called to,Read Back By and Verified With: RN EMERSON MCARDLE [phone] @ 1747 FH Performed at [facility], [address] [place], [place] [address]    Culture KLEBSIELLA PNEUMONIAE (A)  Final   Report Status 12/16/2023 FINAL  Final   Organism ID, Bacteria KLEBSIELLA PNEUMONIAE  Final      Susceptibility   Klebsiella pneumoniae - MIC*    AMPICILLIN RESISTANT Resistant     CEFAZOLIN  (NON-URINE) 2 SENSITIVE Sensitive     CEFEPIME  <=0.12 SENSITIVE Sensitive     ERTAPENEM <=0.12 SENSITIVE Sensitive     CEFTRIAXONE  <=0.25 SENSITIVE Sensitive     CIPROFLOXACIN <=0.06 SENSITIVE Sensitive     GENTAMICIN <=1 SENSITIVE Sensitive     MEROPENEM <=0.25 SENSITIVE Sensitive     TRIMETH /SULFA  <=20 SENSITIVE Sensitive     AMPICILLIN/SULBACTAM <=2 SENSITIVE Sensitive     PIP/TAZO Value in next row Sensitive ug/mL     <=4 SENSITIVEThis is a modified FDA-approved test that has been validated and its performance characteristics determined by the reporting laboratory.  This laboratory is certified under the Clinical Laboratory Improvement Amendments CLIA as qualified to perform high complexity clinical laboratory testing.    * KLEBSIELLA PNEUMONIAE  Blood culture (routine x 2)     Status: None   Collection Time: 12/14/23  3:50 PM   Specimen: BLOOD  Result Value Ref Range Status   Specimen Description BLOOD SITE NOT SPECIFIED  Final   Special  Requests   Final    BOTTLES DRAWN AEROBIC AND ANAEROBIC Blood Culture results may not be optimal due to an inadequate volume of blood received  in culture bottles   Culture   Final    NO GROWTH 5 DAYS Performed at [facility], [address] [place], [place] [address]    Report Status 12/19/2023 FINAL  Final  Blood culture (routine x 2)     Status: None   Collection Time: 12/14/23  3:58 PM   Specimen: BLOOD  Result Value Ref Range Status   Specimen Description BLOOD SITE NOT SPECIFIED  Final   Special Requests   Final    BOTTLES DRAWN AEROBIC AND ANAEROBIC Blood Culture results may not be optimal due to an inadequate volume of blood received in culture bottles   Culture   Final    NO GROWTH 5 DAYS Performed at [facility], [address], [place], [place] [address]    Report Status 12/19/2023 FINAL  Final  Body fluid culture w Gram Stain     Status: None   Collection Time: 12/24/23 12:35 PM   Specimen: Abdomen; Peritoneal Fluid  Result Value Ref Range Status   Specimen Description PERITONEAL  Final   Special Requests NONE  Final   Gram Stain   Final    RARE WBC PRESENT,BOTH PMN AND MONONUCLEAR NO ORGANISMS SEEN    Culture   Final    NO GROWTH 3 DAYS Performed at [facility], [address] [place], [place] [address]    Report Status 12/27/2023 FINAL  Final    Studies/Results: US  Paracentesis Result Date: 12/27/2023 INDICATION: Patient in the hospital with spontaneous bacterial peritonitis. History of recurrent ascites. IR consulted for diagnostic and therapeutic paracentesis. EXAM: ULTRASOUND GUIDED DIAGNOSTIC AND THERAPEUTIC PARACENTESIS MEDICATIONS: 6 mL 1% lidocaine . COMPLICATIONS: None immediate. PROCEDURE: Informed written consent was obtained from the patient after a discussion of the risks, benefits and alternatives to treatment. A timeout was performed prior to the initiation of the procedure. Initial ultrasound scanning demonstrates a moderate  amount of ascites within the left lower abdominal quadrant. The left lower abdomen was prepped and draped in the usual sterile fashion. 1% lidocaine  was used for local anesthesia. Following this, a 6 Fr Safe-T-Centesis catheter was introduced. An ultrasound image was saved for documentation purposes. The paracentesis was performed. The catheter was removed and a dressing was applied. The patient tolerated the procedure well without immediate post procedural complication. FINDINGS: A total of approximately 920 mL of hazy tan fluid was removed. Samples were sent to the laboratory as requested by the clinical team. IMPRESSION: Successful ultrasound-guided paracentesis yielding 920 mL of peritoneal fluid. Performed by: Wyatt Pommier, PA-C Electronically Signed   By: Juliene Balder M.D.   On: 12/27/2023 11:49   CT ABDOMEN PELVIS W CONTRAST Result Date: 12/25/2023 CLINICAL DATA:  Provided history: Ascites, fever of unknown origin. EXAM: CT CHEST, ABDOMEN, AND PELVIS WITH CONTRAST TECHNIQUE: Multidetector CT imaging of the chest, abdomen and pelvis was performed following the standard protocol during bolus administration of intravenous contrast. RADIATION DOSE REDUCTION: This exam was performed according to the departmental dose-optimization program which includes automated exposure control, adjustment of the mA and/or kV according to patient size and/or use of iterative reconstruction technique. CONTRAST:  75mL OMNIPAQUE  IOHEXOL  350 MG/ML SOLN COMPARISON:  Abdominopelvic CT 12/14/2023 FINDINGS: CT CHEST FINDINGS Cardiovascular: The heart is enlarged. Coronary artery and mitral annulus calcifications. Trace pericardial effusion. Mild aortic atherosclerosis without aneurysm or acute aortic findings. Main pulmonary artery is dilated at 3.8 cm. No obvious central pulmonary embolus on this exam not tailored to pulmonary artery assessment. Dilated  vasculature in the left upper extremity, likely related to dialysis access.  Mediastinum/Nodes: No mediastinal, hilar, or axillary adenopathy. No visible thyroid  nodule. Mild wall thickening of the distal esophagus. Lungs/Pleura: Mild biapical pleuroparenchymal scarring. Trace dependent subsegmental atelectasis in the lower lobes. Trace left pleural effusion. No confluent airspace disease or evidence of pneumonia. No features of pulmonary edema. No endobronchial lesion or debris. Musculoskeletal: There are no acute or suspicious osseous abnormalities. CT ABDOMEN PELVIS FINDINGS Hepatobiliary: No focal liver lesion. Question of subtle capsular nodularity of the liver. Layering density in the gallbladder was not seen on prior exam and may represent vicarious excretion of contrast. No biliary dilatation. Pancreas: No ductal dilatation or inflammation. Spleen: Mildly enlarged, 13.8 cm AP. Heterogeneous enhancement felt to be due to phase of contrast. Adrenals/Urinary Tract: No adrenal nodule. Bilateral renal parenchymal atrophy consistent with chronic renal disease. No hydronephrosis or suspicious renal lesion. Absent renal excretion again demonstrated on delayed phase. The urinary bladder is decompressed. Stomach/Bowel: Detailed bowel assessment is limited in the absence of enteric contrast and presence of ascites. Wall thickening of the distal esophagus. Equivocal pre pyloric gastric wall thickening. Diffuse small bowel wall thickening which is nonspecific in the setting of ascites. There is scattered fluid-filled and mildly dilated loops of small bowel. Normal appendix. Moderate formed stool in the colon. Areas of colonic wall thickening involving the ascending and transverse colon. Left colonic diverticulosis without focal diverticulitis. No bowel pneumatosis. Vascular/Lymphatic: Aortic atherosclerosis without aneurysm. Patent portal vein. Tortuous splenic vein likely portal hypertension. Scattered small retroperitoneal nodes are unchanged over the last 11 days. Reproductive: Prostate is  unremarkable.  Right scrotal hydrocele. Other: Moderate volume ascites again demonstrated. There is increasing peritoneal thickening and enhancement about ascites particularly in the right abdomen and pelvis. Moderate to large volume ascites with enhancement tracks into a right inguinal hernia extending into the scrotum. There is generalized edema and soft tissue thickening of the omentum. No free intra-abdominal air. Musculoskeletal: There are no acute or suspicious osseous abnormalities. Chronic bilateral femoral head avascular necrosis. IMPRESSION: 1. Moderate volume ascites. Increasing peritoneal thickening and enhancement about ascites particularly in the right abdomen and pelvis. Findings are suspicious for peritonitis. Generalized edema and soft tissue thickening of the omentum, nonspecific in the setting of ascites. 2. Ascites with enhancement extends into a right inguinal hernia and into the scrotum. 3. Areas of colonic and small bowel wall thickening, nonspecific in the setting of ascites. 4. Wall thickening of the distal esophagus, can be seen with reflux or esophagitis. 5. Cardiomegaly with coronary artery calcifications. 6. Dilated main pulmonary artery, can be seen with pulmonary arterial hypertension. 7. Trace left pleural effusion. 8. Question of subtle capsular nodularity of the liver, can be seen with cirrhosis. Mild splenomegaly and tortuous splenic vein likely portal hypertension. Aortic Atherosclerosis (ICD10-I70.0). Electronically Signed   By: Andrea Gasman M.D.   On: 12/25/2023 15:57   CT CHEST W CONTRAST Result Date: 12/25/2023 CLINICAL DATA:  Provided history: Ascites, fever of unknown origin. EXAM: CT CHEST, ABDOMEN, AND PELVIS WITH CONTRAST TECHNIQUE: Multidetector CT imaging of the chest, abdomen and pelvis was performed following the standard protocol during bolus administration of intravenous contrast. RADIATION DOSE REDUCTION: This exam was performed according to the departmental  dose-optimization program which includes automated exposure control, adjustment of the mA and/or kV according to patient size and/or use of iterative reconstruction technique. CONTRAST:  75mL OMNIPAQUE  IOHEXOL  350 MG/ML SOLN COMPARISON:  Abdominopelvic CT 12/14/2023 FINDINGS: CT CHEST FINDINGS Cardiovascular: The heart is  enlarged. Coronary artery and mitral annulus calcifications. Trace pericardial effusion. Mild aortic atherosclerosis without aneurysm or acute aortic findings. Main pulmonary artery is dilated at 3.8 cm. No obvious central pulmonary embolus on this exam not tailored to pulmonary artery assessment. Dilated vasculature in the left upper extremity, likely related to dialysis access. Mediastinum/Nodes: No mediastinal, hilar, or axillary adenopathy. No visible thyroid  nodule. Mild wall thickening of the distal esophagus. Lungs/Pleura: Mild biapical pleuroparenchymal scarring. Trace dependent subsegmental atelectasis in the lower lobes. Trace left pleural effusion. No confluent airspace disease or evidence of pneumonia. No features of pulmonary edema. No endobronchial lesion or debris. Musculoskeletal: There are no acute or suspicious osseous abnormalities. CT ABDOMEN PELVIS FINDINGS Hepatobiliary: No focal liver lesion. Question of subtle capsular nodularity of the liver. Layering density in the gallbladder was not seen on prior exam and may represent vicarious excretion of contrast. No biliary dilatation. Pancreas: No ductal dilatation or inflammation. Spleen: Mildly enlarged, 13.8 cm AP. Heterogeneous enhancement felt to be due to phase of contrast. Adrenals/Urinary Tract: No adrenal nodule. Bilateral renal parenchymal atrophy consistent with chronic renal disease. No hydronephrosis or suspicious renal lesion. Absent renal excretion again demonstrated on delayed phase. The urinary bladder is decompressed. Stomach/Bowel: Detailed bowel assessment is limited in the absence of enteric contrast and  presence of ascites. Wall thickening of the distal esophagus. Equivocal pre pyloric gastric wall thickening. Diffuse small bowel wall thickening which is nonspecific in the setting of ascites. There is scattered fluid-filled and mildly dilated loops of small bowel. Normal appendix. Moderate formed stool in the colon. Areas of colonic wall thickening involving the ascending and transverse colon. Left colonic diverticulosis without focal diverticulitis. No bowel pneumatosis. Vascular/Lymphatic: Aortic atherosclerosis without aneurysm. Patent portal vein. Tortuous splenic vein likely portal hypertension. Scattered small retroperitoneal nodes are unchanged over the last 11 days. Reproductive: Prostate is unremarkable.  Right scrotal hydrocele. Other: Moderate volume ascites again demonstrated. There is increasing peritoneal thickening and enhancement about ascites particularly in the right abdomen and pelvis. Moderate to large volume ascites with enhancement tracks into a right inguinal hernia extending into the scrotum. There is generalized edema and soft tissue thickening of the omentum. No free intra-abdominal air. Musculoskeletal: There are no acute or suspicious osseous abnormalities. Chronic bilateral femoral head avascular necrosis. IMPRESSION: 1. Moderate volume ascites. Increasing peritoneal thickening and enhancement about ascites particularly in the right abdomen and pelvis. Findings are suspicious for peritonitis. Generalized edema and soft tissue thickening of the omentum, nonspecific in the setting of ascites. 2. Ascites with enhancement extends into a right inguinal hernia and into the scrotum. 3. Areas of colonic and small bowel wall thickening, nonspecific in the setting of ascites. 4. Wall thickening of the distal esophagus, can be seen with reflux or esophagitis. 5. Cardiomegaly with coronary artery calcifications. 6. Dilated main pulmonary artery, can be seen with pulmonary arterial hypertension. 7.  Trace left pleural effusion. 8. Question of subtle capsular nodularity of the liver, can be seen with cirrhosis. Mild splenomegaly and tortuous splenic vein likely portal hypertension. Aortic Atherosclerosis (ICD10-I70.0). Electronically Signed   By: Andrea Gasman M.D.   On: 12/25/2023 15:57      Assessment/Plan:  INTERVAL HISTORY: [name] is status post paracentesis   Principal Problem:   Spontaneous bacterial peritonitis (HCC) Active Problems:   Systemic lupus erythematosus with lung involvement (HCC)   Pancytopenia (HCC)   Chronic systolic CHF (congestive heart failure) (HCC)   Acquired hypothyroidism   ESRD on dialysis (HCC)   Ascites  Essential hypertension   Hyperkalemia    [name] is a 53 y.o. male with ascites, thought NOT to be due to cirrhosis but in context of SLE, hypoalbuminemia, with apparent SBP with clinical improvement but worsening WBC on paracentesis and increased LDH  [name] DID have an SLE flare during this admission  #1 Peritonitis:  IF this elevation in WBC in peritoneum was due to AMP C + from Klebsiella [name] should be growing the klebsiella--which [name] is not  CT chest abdomen and pelvis shows moderate ascites, pleural effusion, mild splenomegalh but nothing on imaging to explain why his WBC in peritoneum went up  We are entertaining other causes of peritonitis including from SLE, other reasons for fluctuation of WBC including HD, steroids and contemplating TB  [name] is now status post paracentesis  Cell count differential are pending as to his repeat culture which will undoubtably be negative in my opinion I am also ensuring that [name] has a large volume of fluid used to send for AFB culture and have discussed with Darquita in micro lab  [name] can continue on cefazolin  and if [name] is doing well could change over to renally dosed cefadroxil to complete a total of 10 days of antibacterial antibiotics   I have personally spent 50 minutes involved in face-to-face  and non-face-to-face activities for this patient on the day of the visit. Professional time spent includes the following activities: Preparing to see the patient (review of tests), Obtaining and/or reviewing separately obtained history (admission/discharge record), Performing a medically appropriate examination and/or evaluation , Ordering medications/tests/procedures, referring and communicating with other health care professionals, Documenting clinical information in the EMR, Independently interpreting results (not separately reported), Communicating results to the patient/family/caregiver, Counseling and educating the patient/family/caregiver and Care coordination (not separately reported).   Evaluation of the patient requires complex antimicrobial therapy evaluation, counseling , isolation needs to reduce disease transmission and risk assessment and mitigation.     LOS: 13 days   [name] 12/27/2023, 1:34 PM

## 2023-12-27 NOTE — Progress Notes (Signed)
 Progress Note   Patient: [name] FMW:[id number] DOB: 06-Jan-1971 DOA: 12/14/2023     13 DOS: the patient was seen and examined on 12/27/2023   Brief hospital course: 53yo with h/o ESRD on TTS HD, SLE, ascites without diagnosis of cirrhosis, and chronic HFrEF who presented on 8/19 with abdominal pain.  Last paracentesis was in 09/2023.  Bedside paracentesis performed with high concern for SBP, cultures positive for Klebsiella, treated with Ceftriaxone  -> Cefazolin  x 14 days.  IR performed paracentesis on 8/20 with 1.8L removed and repeat on 8/26 with 700 cc removed; etiology is thought to be related to severely low albumin .  Developed severe acute metabolic encephalopathy from alcohol withdrawal which has since improved.  Repeat paracentesis ordered.  GI is consulting.  ID is consulting, recommending large volume paracentesis with bulk of fluid to be sent for TB analysis.  Recommended for [facility] therapy at time of dc.  Assessment and Plan:  Spontaneous bacterial peritonitis Patient with known h/o ascites but no documented cirrhosis (see below) Initial peritoneal fluid collection noting greater than 3600 WBCs, 95% neutrophils Culture with Klebsiella Transition ceftriaxone  to cefazolin  for total of 14 days   SBP prophylaxis should be continued for those with h/o SBP with either Bactrim  DS or Ciprofloxacin  Repeat paracentesis on 8/26 was not sent for analysis Repeat paracentesis ordered with studies to include cell count/diff, culture, cytology, protein, albumin  Repeat paracentesis fluid appears significantly worse than prior, despite antibiotics; this is incongruous with his clinical presentation, which appears to be significantly improved CT A/P on 8/30 with moderate volume ascites, suspicious for peritonitis with generalized edema and soft tissue thickening, ascites into R scrotum ID consulted and has also added a CT chest with cardiomegaly with coronary artery calcifications, probable pulmonary  artery HTN Underwent repeat paracentesis on 9/1 with removal of 920 mL hazy tan fluid, with basically all of the fluid sent for AFB stage and culture with MTB NAAT as well as adenosine de-aminase Cell count in today's fluid is dramatically worse, now with 57,750 cells, 93% neutrophils; the fluid color has also progressed from amber to milky Discussed with ID - if this were bacterial in nature, culture should not be aseptic and he would likely be much more ill-appearing (instead of better-appearing) Peritoneal TB is still a consideration and those studies are pending Malignancy is growing concern; while cytology is pending it may also be reasonable to get a peritoneal biopsy   Acute metabolic encephalopathy Likely related to alcohol withdrawal Beginning 8/22 (3 days after presentation) patient appeared very anxious, delirious, paranoid, even hallucinating Discussion with sister - patient does drink to cover pain from his lupus, has reduced his alcohol consumption, reports drinking 1.5 pints of alcohol at a time periodically but denies daily drinking Resolved    Large volume ascites IR paracentesis performed 8/20 with 1.8 L removed Patient having worsening abdominal tightness and tenderness Ordered repeat therapeutic paracentesis 8/26, yielding only Sister reports prior liver biopsy ruling out cirrhosis about 2-3 years ago and performed here but I am not able to appreciate any pathology results from prior liver biopsy done in our system at this time Given his h/o SLE in addition to ETOH consumption, cirrhosis remains probable in the setting of recurrent ascites CT A/P on 8/30 (in addition to ascites as above) showed possible subtle capsular nodularity of the liver which can be seen with cirrhosis and also mild splenomegaly with suggestion of portal HTN GI consulting Repeat paracentesis on 9/1, sent for TB analysis;  cell count is markedly uptrending He appears likely to need recurrent  paracentesis and reports that he has the number to call to arrange as needed   Systemic lupus erythematosus with lung involvement Sister is concerned that his presentation is related to SLE Dr. Melia notes that lupus flares are less common in HD patients but still possible Treating with Plaquenil  200 mg PO BID and prednisone  20 mg daily and he is showing improvement He has an ongoing need to follow-up closely as lupus appears to be underlying cause of many of his comorbidities both directly and indirectly Nephrology recommends continuing treatment with Plaquenil  200 mg BID for at least 2 weeks and then decrease to 1 tablet daily for 2 weeks, monitoring for any signs of recurrent activity Nephrology is slowly tapering the steroids and has decreased from 20 mg daily to 15 mg as of today   Abdominal pain Possibly related to SBP vs. R inguinal hernia with significant R scrotal edema vs. SLE flare vs. 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 Continue HD TTS   Nephrology consulting   Chronic HFpEF Appears euvolemic, although scrotal edema and recurrent ascites persists Volume per nephrology/HD/UF   Hypertension Continue carvedilol    Hypothyroidism TSH 7.89 Initiated on Synthroid  50 micrograms daily Follow up TFTs in 4-6 weeks   Chronic pancytopenia Long-standing issue - possibly related to SLE and immunosuppression therapies but also could be related to underlying liver derangement Currently stable SCDs for DVT prophylaxis   GERD Reported indigestion today with throat burning 1 dose of Maalox given GERD/esophagitis seen on CT Will add Protonix  daily   Goals of care Patient has multiple comorbidities, very frail, low  albumin , poor p.o. intake Discussed at length with patient's sister about need for close follow-up with multiple subspecialists PT/OT consulted and recommended STR but he has become much more mobile and now wants to go home with [facility] TOC consulted Will dc to home once peritonitis is effectively managed             Consultants: Nephrology  IR GI ID PT OT TOC team Dietician   Procedures: Paracentesis, 8/19, 8/20, 8/26, 8/28   Antibiotics: Ceftriaxone  8/19-26 Metronidazole  8/19-22 Cefazolin  8/27-9/7   30 Day Unplanned Readmission Risk Score    Flowsheet Row ED to Hosp-Admission (Current) from 12/14/2023 in [facility]  30 Day Unplanned Readmission Risk Score (%) 19.88 Filed at 12/27/2023 1200    This score is the patient's risk of an unplanned readmission within 30 days of being discharged (0 -100%). The score is based on dignosis, age, lab data, medications, orders, and past utilization.   Low:  0-14.9   Medium: 15-21.9   High: 22-29.9   Extreme: 30 and above           Subjective: Feeling ok today.  Has very sore throat from reflux.  Otherwise, feels better after para   Objective: Vitals:   12/26/23 2040 12/27/23 0408  BP: 138/79 (!) 149/79  Pulse: 66 65  Resp: 18 18  Temp: 97.9 F (36.6 C) (!) 97.4 F (36.3 C)  SpO2: 99% 97%    Intake/Output Summary (Last 24 hours) at 12/27/2023 1505 Last data filed at 12/26/2023 1800 Gross per  24 hour  Intake 120 ml  Output --  Net 120 ml   Filed Weights   12/25/23 1224 12/27/23 0912 12/27/23 1025  Weight: 67.7 kg 66.9 kg 66 kg    Exam:  General:  Appears frail, cachectic, chronically ill, alert, appropriate, markedly improved Eyes:  normal lids, iris ENT:  grossly normal hearing, lips & tongue, mmm Cardiovascular:  RRR. 1+ LE edema.  Respiratory:   CTA bilaterally with no wheezes/rales/rhonchi.  Normal respiratory effort. Abdomen:  mild ascites, improving diffuse TTP Skin:  no rash or  induration seen on limited exam Musculoskeletal:  improving strength, no bony abnormality Psychiatric:  more perky mood and affect, speech fluent and appropriate, AOx3 Neurologic:  no gross abnormalities noted on limited exam  Data Reviewed: I have reviewed the patient's lab results since admission.  Pertinent labs for today include:   Na++ 129 K+ 5.3 BUN 49/Creatinine 5.6/GFR 11 Albumin  1.6 WBC 4.2 Hgb 9.9 Platelets 115 Peritoneal fluid from 8/29 negative at 3 days Peritoneal fluid cytology is pending Cell count in peritoneal fluid:  3650 -> 10425 -> 57750 From amber -> orange/cloudy -> milky/turbid     Family Communication: Sister was present  Mobility: PT/OT Consulted and are recommending - Home Health Pt9/04/2023 0900    Code Status: Full Code  Barriers to discharge:  ongoing management  Disposition: Status is: Inpatient Remains inpatient appropriate because: ongoing management     Time spent: 50 minutes  Unresulted Labs (From admission, onward)     Start     Ordered   12/27/23 1049  Body fluid culture w Gram Stain  Once,   R       Question:  Are there also cytology or pathology orders on this specimen?  Answer:  No   12/27/23 1049   12/27/23 1049  Lactate dehydrogenase (pleural or peritoneal fluid)  Once,   R        12/27/23 1049   12/26/23 1217  MT-RIF NAA W Cult, Non-Sputum  Once,   R       Comments: This will be actually peritoneal fluid   Question:  Specimen source:  Answer:  Pleural   12/26/23 1217   12/26/23 1216  Acid Fast Smear (AFB)  (AFB smear + Culture w reflexed sensitivities with precautions panel)  Once,   R       Comments: Please use as MUCH as possible from paracentesis, if necessary centrifuge down liters of fluid to send for AFB culture   Placed in And Linked Group   12/26/23 1217   12/26/23 1216  Acid Fast Culture with reflexed sensitivities  (AFB smear + Culture w reflexed sensitivities with precautions panel)  Once,   R        Comments: Please use as MUCH as possible from paracentesis, if necessary centrifuge down liters of fluid to send for AFB culture   Placed in And Linked Group   12/26/23 1217   Signed and Held  Renal function panel  Tomorrow morning,   R       Question:  Specimen collection method  Answer:  Lab=Lab collect   Signed and Held   Signed and Held  CBC  Tomorrow morning,   R       Question:  Specimen collection method  Answer:  Lab=Lab collect   Signed and Held             Author: Delon Herald, MD 12/27/2023 3:05 PM  For on call review [email or web address].

## 2023-12-27 NOTE — Progress Notes (Signed)
      Skyrocketting Pleural fluid WBC noted along with super high LDH  IN addition to routine cultures (GS negative) and AFB cultures I have asked micro to set up fungal cultures to be done to incubate for 6 weeks to look for something such as histoplasma or blastomyces, as well as ADA for TB workup  I am ordering cryptococcal ag in serum (though crypto would grow on routine media) serum histo and blasto ag (urine is more S but I dont think he makes much urine) and Quantiferon gold  I think he really may need a peritoneal biopsy for pathology, AFB and fungal culture  We could try empiric therapy for TB and fungal but I really would want to make sure that everything has been sent that can be sent.     Jomarie Fleeta Rothman 12/27/2023, 5:53 PM

## 2023-12-27 NOTE — Progress Notes (Signed)
 Physical Therapy Treatment Patient Details Name: [name] MRN: [id number] DOB: [birth year] Today's Date: 12/27/2023   History of Present Illness Pt is a 53 y/o M admitted on 12/14/23 after presenting with c/o abdominal pain. Pt is being treated for spontaneous bacterial peritonitis, acute metabolic encephalopathy. PMH: ESRD on HD TTS, lupus, ascites, systolic CHF    PT Comments  The pt is making great functional progress, and as a result his PT goals were updated and upgraded. The pt is now ambulating up to ~120 ft at a CGA level, intermittently even taking steps without RW support. He does display some mild balance deficits still though, noted by his sway when ambulating without UE support and with dynamic reaching off his COG. He was also able to navigate x2 stairs with handrail support and CGA for safety, displaying continued deficits in leg strength but no noted knee buckling this date. Considering his great functional progress and the pt reporting he can get family support as needed and even have someone stay with him initially upon d/c, updated d/c recs to home with HHPT follow-up. Pt in agreement. Encouraged someone to stay with him at home to assist him initially and to use his RW initially to ensure safety. Encouraged pt to keep a charged phone on him as well. He verbalized understanding. Will continue to follow acutely.     If plan is discharge home, recommend the following: Direct supervision/assist for medications management;Help with stairs or ramp for entrance;Assist for transportation;Assistance with cooking/housework;Direct supervision/assist for financial management;Supervision due to cognitive status;A little help with walking and/or transfers;A little help with bathing/dressing/bathroom   Can travel by private vehicle     Yes  Equipment Recommendations  BSC/3in1;Wheelchair (measurements PT);Wheelchair cushion (measurements PT);Rollator (4 wheels) (w/c for community mobility)     Recommendations for Other Services       Precautions / Restrictions Precautions Precautions: Fall Restrictions Weight Bearing Restrictions Per Provider Order: No     Mobility  Bed Mobility Overal bed mobility: Needs Assistance Bed Mobility: Supine to Sit     Supine to sit: HOB elevated, Used rails, Supervision     General bed mobility comments: HOB elevated and pt utilizing bed rails to pull trunk up to sit and scoot anteriorly to L EOB, supervision for safety    Transfers Overall transfer level: Needs assistance Equipment used: Rolling walker (2 wheels) Transfers: Sit to/from Stand Sit to Stand: Contact guard assist           General transfer comment: Pt able to stand from EOB to RW without LOB, CGA for safety, x2 reps.    Ambulation/Gait Ambulation/Gait assistance: Contact guard assist Gait Distance (Feet): 120 Feet (x2 bouts of ~120 ft > ~20 ft) Assistive device: Rolling walker (2 wheels), None Gait Pattern/deviations: Decreased step length - right, Decreased step length - left, Decreased stride length, Trunk flexed, Step-through pattern Gait velocity: reduced Gait velocity interpretation: <1.31 ft/sec, indicative of household ambulator   General Gait Details: Pt takes slow, small steps with a flexed posture but did not have any knee buckling this date. Pt even able to ambulate ~20 ft without UE support with mild sway and no LOB. CGA for safety   Stairs Stairs: Yes Stairs assistance: Contact guard assist Stair Management: Two rails, Alternating pattern, Forwards, No rails Number of Stairs: 2 General stair comments: Ascends and descends x2 stairs with slow steps, mild unsteadiness, but no LOB. CGA for safety. Pt ascends with bil rails but on the final step down  pt descended without rails to simulate only being able to reach the door frame for support on stairs at home.   Wheelchair Mobility     Tilt Bed    Modified Rankin (Stroke Patients Only)        Balance Overall balance assessment: Needs assistance Sitting-balance support: Feet supported, No upper extremity supported Sitting balance-Leahy Scale: Good Sitting balance - Comments: No overt LOB sitting EOB   Standing balance support: During functional activity, Single extremity supported, Bilateral upper extremity supported, No upper extremity supported Standing balance-Leahy Scale: Fair Standing balance comment: Able to reach off COG while standing and taking slow steps without UE support, noted trunk sway but no LOB, CGA for safety. Prefers RW for longer distance mobility                            Communication Communication Communication: Impaired Factors Affecting Communication: Other (comment) (hoarse voice, reportedly from acid reflux)  Cognition Arousal: Alert Behavior During Therapy: Flat affect   PT - Cognitive impairments: No family/caregiver present to determine baseline                       PT - Cognition Comments: Pt much more alert and appropriate in conversation and staying on track. Assume to be close to if not at baseline, but no family present to confirm at this time. Following commands: Intact      Cueing Cueing Techniques: Verbal cues, Tactile cues  Exercises      General Comments General comments (skin integrity, edema, etc.): Encouraged someone to stay with him at home to assist him initially and to use his RW initially to ensure safety. Encouraged pt to keep a charged phone on him as well. He verbalized understanding.      Pertinent Vitals/Pain Pain Assessment Pain Assessment: Faces Faces Pain Scale: Hurts little more Pain Location: generalized, tongue from acid reflux per pt Pain Descriptors / Indicators: Grimacing, Guarding, Discomfort, Sore Pain Intervention(s): Limited activity within patient's tolerance, Monitored during session, Repositioned, Other (comment) (notified RN of pt's request for more Tums meds)     Home Living                          Prior Function            PT Goals (current goals can now be found in the care plan section) Acute Rehab PT Goals Patient Stated Goal: to reduce pain and improve PT Goal Formulation: With patient Time For Goal Achievement: 01/03/24 Potential to Achieve Goals: Fair Progress towards PT goals: Progressing toward goals    Frequency    Min 2X/week      PT Plan      Co-evaluation              AM-PAC PT 6 Clicks Mobility   Outcome Measure  Help needed turning from your back to your side while in a flat bed without using bedrails?: A Little Help needed moving from lying on your back to sitting on the side of a flat bed without using bedrails?: A Little Help needed moving to and from a bed to a chair (including a wheelchair)?: A Little Help needed standing up from a chair using your arms (e.g., wheelchair or bedside chair)?: A Little Help needed to walk in hospital room?: A Little Help needed climbing 3-5 steps with a railing? : A Little 6 Click  Score: 18    End of Session Equipment Utilized During Treatment: Gait belt Activity Tolerance: Patient tolerated treatment well Patient left: with call bell/phone within reach;in chair;with chair alarm set   PT Visit Diagnosis: Unsteadiness on feet (R26.81);Other abnormalities of gait and mobility (R26.89);Difficulty in walking, not elsewhere classified (R26.2);Muscle weakness (generalized) (M62.81)     Time: 9190-9169 PT Time Calculation (min) (ACUTE ONLY): 21 min  Charges:    $Gait Training: 8-22 mins PT General Charges $$ ACUTE PT VISIT: 1 Visit                     Theo Ferretti, PT, DPT Acute Rehabilitation Services  Office: [phone]    Theo CHRISTELLA Ferretti 12/27/2023, 9:25 AM

## 2023-12-28 DIAGNOSIS — K652 Spontaneous bacterial peritonitis: Secondary | ICD-10-CM | POA: Diagnosis not present

## 2023-12-28 DIAGNOSIS — R188 Other ascites: Secondary | ICD-10-CM | POA: Diagnosis not present

## 2023-12-28 DIAGNOSIS — E039 Hypothyroidism, unspecified: Secondary | ICD-10-CM | POA: Diagnosis not present

## 2023-12-28 DIAGNOSIS — B961 Klebsiella pneumoniae [K. pneumoniae] as the cause of diseases classified elsewhere: Secondary | ICD-10-CM | POA: Diagnosis not present

## 2023-12-28 DIAGNOSIS — I5022 Chronic systolic (congestive) heart failure: Secondary | ICD-10-CM | POA: Diagnosis not present

## 2023-12-28 MED ORDER — LIDOCAINE HCL (PF) 1 % IJ SOLN: 5.0000 mL | INTRAMUSCULAR | Status: DC | PRN

## 2023-12-28 MED ORDER — CEFAZOLIN IV (FOR PTA / DISCHARGE USE ONLY): 2.0000 g | INTRAVENOUS | Status: AC

## 2023-12-28 MED ORDER — CEFAZOLIN SODIUM-DEXTROSE 2-4 GM/100ML-% IV SOLN
INTRAVENOUS | Status: AC
  Filled 2023-12-28: qty 100

## 2023-12-28 MED ORDER — CEFAZOLIN SODIUM-DEXTROSE 2-4 GM/100ML-% IV SOLN
2.0000 g | INTRAVENOUS | Status: DC
  Administered 2023-12-30 – 2024-01-01 (×2): 2 g via INTRAVENOUS
  Filled 2023-12-28: qty 100

## 2023-12-28 MED ORDER — PENTAFLUOROPROP-TETRAFLUOROETH EX AERO: 1.0000 | INHALATION_SPRAY | CUTANEOUS | Status: DC | PRN

## 2023-12-28 MED ORDER — PENTAFLUOROPROP-TETRAFLUOROETH EX AERO
INHALATION_SPRAY | CUTANEOUS | Status: AC
  Filled 2023-12-28: qty 30

## 2023-12-28 MED ORDER — HEPARIN SODIUM (PORCINE) 1000 UNIT/ML DIALYSIS: 1000.0000 [IU] | INTRAMUSCULAR | Status: DC | PRN

## 2023-12-28 MED ORDER — CEFAZOLIN SODIUM-DEXTROSE 1-4 GM/50ML-% IV SOLN
1.0000 g | INTRAVENOUS | Status: AC
  Administered 2023-12-29: 1 g via INTRAVENOUS
  Filled 2023-12-28: qty 50

## 2023-12-28 MED ORDER — LIDOCAINE-PRILOCAINE 2.5-2.5 % EX CREA: 1.0000 | TOPICAL_CREAM | CUTANEOUS | Status: DC | PRN

## 2023-12-28 NOTE — Progress Notes (Signed)
 PHARMACY CONSULT NOTE FOR:  OUTPATIENT  PARENTERAL ANTIBIOTIC THERAPY with Dialysis   Indication: SBP  Regimen: Cefazolin  2 gm IV every HD TTHSa  End date: 01/08/24   No formal OPAT will be done as patient will receive antibiotics with HD. Informational discharge order will be placed.   Thank you for allowing pharmacy to be a part of this patient's care.  Damien Quiet, PharmD, BCPS, BCIDP Infectious Diseases Clinical Pharmacist Phone: [phone] 12/28/2023, 3:20 PM

## 2023-12-28 NOTE — Plan of Care (Signed)
 Gait steady. PD fluid abnormal-medical evaluation ongoing. Tolerated HD.  Problem: Education: Goal: Knowledge of General Education information will improve Description: Including pain rating scale, medication(s)/side effects and non-pharmacologic comfort measures Outcome: Progressing   Problem: Health Behavior/Discharge Planning: Goal: Ability to manage health-related needs will improve Outcome: Progressing   Problem: Clinical Measurements: Goal: Ability to maintain clinical measurements within normal limits will improve Outcome: Not Progressing Goal: Will remain free from infection Outcome: Not Progressing Goal: Respiratory complications will improve Outcome: Progressing Goal: Cardiovascular complication will be avoided Outcome: Progressing   Problem: Activity: Goal: Risk for activity intolerance will decrease Outcome: Progressing   Problem: Nutrition: Goal: Adequate nutrition will be maintained Outcome: Progressing   Problem: Coping: Goal: Level of anxiety will decrease Outcome: Progressing   Problem: Elimination: Goal: Will not experience complications related to bowel motility Outcome: Progressing Goal: Will not experience complications related to urinary retention Outcome: Progressing   Problem: Pain Managment: Goal: General experience of comfort will improve and/or be controlled Outcome: Progressing   Problem: Safety: Goal: Ability to remain free from injury will improve Outcome: Progressing   Problem: Skin Integrity: Goal: Risk for impaired skin integrity will decrease Outcome: Progressing   Problem: Safety: Goal: Non-violent Restraint(s) Outcome: Progressing   Problem: Safety: Goal: Non-violent Restraint(s) Outcome: Progressing   Problem: Fluid Volume: Goal: Compliance with measures to maintain balanced fluid volume will improve Outcome: Progressing   Problem: Health Behavior/Discharge Planning: Goal: Ability to manage health-related needs will  improve Outcome: Progressing   Problem: Nutritional: Goal: Ability to make healthy dietary choices will improve Outcome: Progressing   Problem: Nutritional: Goal: Ability to make healthy dietary choices will improve Outcome: Progressing   Problem: Clinical Measurements: Goal: Complications related to the disease process, condition or treatment will be avoided or minimized Outcome: Progressing

## 2023-12-28 NOTE — Plan of Care (Signed)
  Problem: Skin Integrity: Goal: Risk for impaired skin integrity will decrease Outcome: Progressing  Interventions Assess risk factors for imparied skin integrity and/or pressure injuries Initiate pressure injury prevention interventions if Braden Score </= 18 Assess/Monitor skin integrity, appearance and/or temperature Provide skin care

## 2023-12-28 NOTE — Progress Notes (Signed)
 Progress Note   Patient: [name] FMW:[id number] DOB: December 01, 1970 DOA: 12/14/2023     14 DOS: the patient was seen and examined on 12/28/2023   Brief hospital course: 53yo with h/o ESRD on TTS HD, SLE, ascites without diagnosis of cirrhosis, and chronic HFrEF who presented on 8/19 with abdominal pain.  Last paracentesis was in 09/2023.  Bedside paracentesis performed with high concern for SBP, cultures positive for Klebsiella, treated with Ceftriaxone  -> Cefazolin  x 14 days.  IR performed paracentesis on 8/20 with 1.8L removed and repeat on 8/26 with 700 cc removed; etiology is thought to be related to severely low albumin .  Developed severe acute metabolic encephalopathy from alcohol withdrawal which has since improved.  Repeat paracentesis ordered.  GI is consulting.  ID is consulting, recommending large volume paracentesis with bulk of fluid to be sent for TB analysis.  Recommended for [facility] therapy at time of dc.  Assessment and Plan:  Spontaneous bacterial peritonitis Patient with known h/o ascites but no documented cirrhosis (see below) Initial peritoneal fluid collection noting greater than 3600 WBCs, 95% neutrophils Culture with Klebsiella Transition ceftriaxone  to cefazolin  to cefadroxil for 3 total weeks, to be given with HD SBP prophylaxis is not needed, per ID Repeat paracentesis on 8/20, 8/26, 8/28, 9/1 - fluid appears significantly worse than prior, despite antibiotics; this is incongruous with his clinical presentation, which appears to be significantly improved CT A/P on 8/30 with moderate volume ascites, suspicious for peritonitis with generalized edema and soft tissue thickening, ascites into R scrotum ID consulted and has also added a CT chest with cardiomegaly with coronary artery calcifications, 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 Underwent repeat paracentesis on 9/1 with removal of 920 mL hazy tan fluid, with basically all of the fluid sent for AFB stage and culture with MTB NAAT  as well as adenosine de-aminase Cell count in today's fluid is dramatically worse, now with 57,750 cells, 93% neutrophils; the fluid color has also progressed from amber to milky Discussed with ID - if this were bacterial in nature, culture should not be aseptic and he would likely be much more ill-appearing (instead of better-appearing) Peritoneal TB is still a consideration and those studies are pending as well as fungal studies Malignancy is growing concern; while cytology is pending (prelim report with no malignant cells identified), it may also be reasonable to get a peritoneal biopsy - surgery is willing (IR is not) if absolutely needed after other studies result Autoimmune response currently appears to be most likely as the etiology Will also try to add chlyomicrons and triglycerides to fluid   Acute metabolic encephalopathy Likely related to alcohol withdrawal Beginning 8/22 (3 days after presentation) patient appeared very anxious, delirious, paranoid, even hallucinating Discussion with sister - patient does drink to cover pain from his lupus, has reduced his alcohol consumption, reports drinking 1.5 pints of alcohol at a time periodically but denies daily drinking Resolved    Large volume ascites IR paracentesis performed 8/20 with 1.8 L removed Patient having worsening abdominal tightness and tenderness Ordered repeat therapeutic paracentesis 8/26, yielding only Sister reports prior liver biopsy ruling out cirrhosis about 2-3 years ago and performed here but I am not able to appreciate any pathology results from prior liver biopsy done in our system at this time Given his h/o SLE in addition to ETOH consumption, cirrhosis remains probable in the setting of recurrent ascites CT A/P on 8/30 (in addition to ascites as above) showed possible subtle capsular nodularity of the liver  which can be seen with cirrhosis and also mild splenomegaly with suggestion of portal HTN GI  consulting Repeat paracentesis on 9/1, sent for TB analysis; cell count is markedly uptrending He appears likely to need recurrent paracentesis and reports that he has the number to call to arrange as needed   Systemic lupus erythematosus with lung involvement Sister is concerned that his presentation is related to SLE Dr. Melia notes that lupus flares are less common in HD patients but still possible Treating with Plaquenil  200 mg PO BID and prednisone  20 mg daily and he is showing improvement He has an ongoing need to follow-up closely as lupus appears to be underlying cause of many of his comorbidities both directly and indirectly Nephrology recommends continuing treatment with Plaquenil  200 mg BID for at least 2 weeks and then decrease to 1 tablet daily for 2 weeks, monitoring for any signs of recurrent activity Nephrology is slowly tapering the steroids and has decreased from 20 mg daily to 15 mg as of today   Abdominal pain Possibly related to SBP vs. R inguinal hernia with significant R scrotal edema vs. SLE flare vs. 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 Continue HD TTS   Nephrology consulting   Chronic HFpEF Appears euvolemic, although scrotal edema and recurrent ascites persists Volume per nephrology/HD/UF   Hypertension Continue carvedilol    Hypothyroidism TSH 7.89 Initiated on Synthroid  50 micrograms daily Follow up TFTs in 4-6 weeks   Chronic pancytopenia Long-standing issue - possibly related to SLE and immunosuppression therapies but also could be related to underlying liver derangement Currently stable SCDs for DVT prophylaxis   GERD Reported indigestion today with throat burning 1 dose of Maalox  given GERD/esophagitis seen on CT Will add Protonix  daily   Goals of care Patient has multiple comorbidities, very frail, low albumin , poor p.o. intake Discussed at length with patient's sister about need for close follow-up with multiple subspecialists PT/OT consulted and recommended STR but he has become much more mobile and now wants to go home with [facility] TOC consulted Will dc to home once peritonitis is effectively managed             Consultants: Nephrology  IR GI ID PT OT TOC team Dietician   Procedures: Paracentesis, 8/19, 8/20, 8/26, 8/28, 9/1   Antibiotics: Ceftriaxone  8/19-26 Metronidazole  8/19-22 Cefazolin  8/27-9/7     30 Day Unplanned Readmission Risk Score    Flowsheet Row ED to Hosp-Admission (Current) from 12/14/2023 in [facility]M KIDNEY UNIT  30 Day Unplanned Readmission Risk Score (%) 20.54 Filed at 12/28/2023 0400    This score is the patient's risk of an unplanned readmission within 30 days of being discharged (0 -100%). The score is based on dignosis, age, lab data, medications, orders, and past utilization.   Low:  0-14.9   Medium: 15-21.9   High: 22-29.9   Extreme: 30 and above           Subjective: Continues to feel better.  Seen in HD, sad to not be going home.   Objective: Vitals:   12/28/23 1242 12/28/23 1316  BP: (!) 159/77 (!) 171/77  Pulse: 71 71  Resp: 16 16  Temp:  97.7 F (36.5 C)  SpO2: 100% 100%    Intake/Output Summary (Last 24 hours) at 12/28/2023 1550 Last data filed at 12/28/2023 1449 Gross per 24 hour  Intake 150 ml  Output 2500 ml  Net -2350 ml   Filed Weights   12/28/23 0803 12/28/23 0833 12/28/23 1242  Weight: 66.9 kg 61.1 kg 64.4 kg    Exam:  General:  Appears frail, cachectic, but so much better clinically every day Eyes:  normal lids, iris ENT:  grossly normal hearing, lips & tongue, mmm Cardiovascular:  RRR. No LE edema.  Respiratory:   CTA bilaterally with no wheezes/rales/rhonchi.   Normal respiratory effort. Abdomen:  mild to moderate ascites, persistent but improved diffuse TTP Skin:  no rash or induration seen on limited exam Musculoskeletal:  improving strength, no bony abnormality Psychiatric:  more perky mood and affect, speech fluent and appropriate, AOx3 Neurologic:  no gross abnormalities noted on limited exam  Data Reviewed: I have reviewed the patient's lab results since admission.  Pertinent labs for today include:   Negative crypto Peritoneal fluid with LDH >2500, cell count 57,750 and mostly neutrophils     Family Communication: None present today  Mobility: PT/OT Consulted and are recommending - Home Health Pt9/04/2023 0900    Code Status: Full Code  Barriers to discharge:  peritonitis  Disposition: Status is: Inpatient Remains inpatient appropriate because: ongoing management     Time spent: 50 minutes  Unresulted Labs (From admission, onward)     Start     Ordered   12/29/23 0500  CBC with Differential/Platelet  Tomorrow morning,   R       Question:  Specimen collection method  Answer:  Lab=Lab collect   12/28/23 1550   12/29/23 0500  Comprehensive metabolic panel with GFR  Tomorrow morning,   R       Question:  Specimen collection method  Answer:  Lab=Lab collect   12/28/23 1550   12/28/23 1536  Triglycerides, Body Fluid  Add-on,   AD       Comments: Please also add on chylomicrons to prior sample, if possible    12/28/23 1535   12/27/23 1752  Blastomyces Antigen  Once,   R        12/27/23 1751   12/27/23 1752  Histoplasma antibodies, Qn,DID  Once,   R       Question:  Specimen collection method  Answer:  Lab=Lab collect   12/27/23 1751   12/27/23 1752  Fungitell Beta-D-Glucan  Once,   R       Question:  Specimen collection method  Answer:  Lab=Lab collect   12/27/23 1751   12/27/23 1752  QuantiFERON-TB Gold Plus  Once,   R       Question:  Specimen collection method  Answer:  Lab=Lab collect   12/27/23 1751   12/27/23  1740  Miscellaneous [facility] test (send-out)  Once,   R       Question:  Test name / description:  Answer:  Adenosine De-aminase   12/27/23 1740   12/26/23 1217  MT-RIF NAA W Cult, Non-Sputum  Once,   R       Comments: This will be actually peritoneal fluid   Question:  Specimen source:  Answer:  Pleural   12/26/23 1217   12/26/23 1216  Acid Fast Smear (AFB)  (AFB smear + Culture w reflexed sensitivities with precautions panel)  Once,   R       Comments: Please use as MUCH as possible from paracentesis,  if necessary centrifuge down liters of fluid to send for AFB culture   Placed in And Linked Group   12/26/23 1217   12/26/23 1216  Acid Fast Culture with reflexed sensitivities  (AFB smear + Culture w reflexed sensitivities with precautions panel)  Once,   R       Comments: Please use as MUCH as possible from paracentesis, if necessary centrifuge down liters of fluid to send for AFB culture   Placed in And Linked Group   12/26/23 1217             Author: Delon Herald, MD 12/28/2023 3:50 PM  For on call review [email or web address].

## 2023-12-28 NOTE — Progress Notes (Signed)
 Occupational Therapy Treatment Patient Details Name: [name] MRN: [id number] DOB: 1970-05-30 Today's Date: 12/28/2023   History of present illness Pt is a 53 y/o M admitted on 12/14/23 after presenting with c/o abdominal pain. Pt is being treated for spontaneous bacterial peritonitis, acute metabolic encephalopathy. PMH: ESRD on HD TTS, lupus, ascites, systolic CHF   OT comments  Patient making great gains with OT treatment meeting bathing and dressing goals and following commands. Patient able to perform mobility without an assistive device with CGA with one episode of LOB, possible due to wearing tennis shoes.  Discharge recommendations changed from SNF to [facility] due to patient's progress.  Acute OT to continue to follow to address established goals.       If plan is discharge home, recommend the following:  A little help with walking and/or transfers;A little help with bathing/dressing/bathroom;Assist for transportation   Equipment Recommendations  None recommended by OT    Recommendations for Other Services      Precautions / Restrictions Precautions Precautions: Fall Restrictions Weight Bearing Restrictions Per Provider Order: No       Mobility Bed Mobility Overal bed mobility: Needs Assistance Bed Mobility: Supine to Sit     Supine to sit: HOB elevated, Used rails, Supervision     General bed mobility comments: remained on EOB at end of session    Transfers Overall transfer level: Needs assistance Equipment used: None Transfers: Sit to/from Stand Sit to Stand: Contact guard assist           General transfer comment: CGA for sit to stand and for transfers, patient asking to perform mobility without RW with CGA for safety and one episode of LOB and min assist to correct     Balance Overall balance assessment: Needs assistance Sitting-balance support: Feet supported, No upper extremity supported Sitting balance-Leahy Scale: Good     Standing balance  support: Single extremity supported, No upper extremity supported, During functional activity Standing balance-Leahy Scale: Fair Standing balance comment: stood at sink and performed mobility with no UE support and CGA                           ADL either performed or assessed with clinical judgement   ADL Overall ADL's : Needs assistance/impaired     Grooming: Wash/dry hands;Wash/dry face;Oral care;Supervision/safety;Standing Grooming Details (indicate cue type and reason): at sink         Upper Body Dressing : Supervision/safety;Sitting Upper Body Dressing Details (indicate cue type and reason): changed T-shirts Lower Body Dressing: Contact guard assist;Sit to/from stand Lower Body Dressing Details (indicate cue type and reason): able to donn underwear, shorts and shoes seated on EOB and CGA when standing to pull up shorts             Functional mobility during ADLs: Contact guard assist General ADL Comments: performed functional mobility in room and hallway without RW    Extremity/Trunk Assessment              Vision       Perception     Praxis     Communication Communication Communication: Impaired Factors Affecting Communication: Other (comment) (hoarse voice)   Cognition Arousal: Alert Behavior During Therapy: Flat affect Cognition: No apparent impairments             OT - Cognition Comments: alert and oriented x4, able to recall therapist from previous visit  Following commands: Intact        Cueing   Cueing Techniques: Verbal cues, Tactile cues  Exercises      Shoulder Instructions       General Comments      Pertinent Vitals/ Pain       Pain Assessment Pain Assessment: No/denies pain Pain Intervention(s): Monitored during session  Home Living                                          Prior Functioning/Environment              Frequency  Min 2X/week        Progress  Toward Goals  OT Goals(current goals can now be found in the care plan section)  Progress towards OT goals: Progressing toward goals  Acute Rehab OT Goals Patient Stated Goal: to go home OT Goal Formulation: With patient Time For Goal Achievement: 01/03/24 Potential to Achieve Goals: Good ADL Goals Pt Will Perform Upper Body Dressing: with set-up;with supervision Pt Will Perform Lower Body Dressing: with min assist Pt Will Transfer to Toilet: with contact guard assist Pt Will Perform Toileting - Clothing Manipulation and hygiene: with set-up;with contact guard assist Additional ADL Goal #1: Pt will be able to follow commands >75% consistently to maximize safety and functional independence with ADLs/transfers  Plan      Co-evaluation                 AM-PAC OT 6 Clicks Daily Activity     Outcome Measure   Help from another person eating meals?: A Little Help from another person taking care of personal grooming?: A Little Help from another person toileting, which includes using toliet, bedpan, or urinal?: A Little Help from another person bathing (including washing, rinsing, drying)?: A Little Help from another person to put on and taking off regular upper body clothing?: A Little Help from another person to put on and taking off regular lower body clothing?: A Little 6 Click Score: 18    End of Session Equipment Utilized During Treatment: Gait belt  OT Visit Diagnosis: Unsteadiness on feet (R26.81);Other abnormalities of gait and mobility (R26.89);Repeated falls (R29.6);Muscle weakness (generalized) (M62.81);Pain;Other symptoms and signs involving cognitive function   Activity Tolerance Patient tolerated treatment well   Patient Left in bed;with call bell/phone within reach (seated on EOB)   Nurse Communication Mobility status        Time: 9282-9251 OT Time Calculation (min): 31 min  Charges: OT General Charges $OT Visit: 1 Visit OT Treatments $Self  Care/Home Management : 23-37 mins  Dick Laine, OTA Acute Rehabilitation Services  Office [phone]   Jeb LITTIE Laine 12/28/2023, 10:23 AM

## 2023-12-28 NOTE — Progress Notes (Incomplete)
 [facility]  Date of Admission:  12/14/2023      Total days of antibiotics 15          ASSESSMENT: [name] is a 53 y.o. male admitted with:   Bacterial Peritonitis, Secondary -  ?If related to autoimmune process. With rising WBC despite appropriate antibiotic treatment for the positive culture we had on 8/19   PLAN: ***  Principal Problem:   Spontaneous bacterial peritonitis (HCC) Active Problems:   Systemic lupus erythematosus with lung involvement (HCC)   Pancytopenia (HCC)   Chronic systolic CHF (congestive heart failure) (HCC)   Acquired hypothyroidism   ESRD on dialysis (HCC)   Ascites   Essential hypertension   Hyperkalemia    carvedilol   25 mg Oral BID   Chlorhexidine  Gluconate Cloth  6 each Topical Q0600   docusate sodium   100 mg Oral BID   doxercalciferol   2 mcg Intravenous Q T,Th,Sa-HD   feeding supplement (NEPRO CARB STEADY)  237 mL Oral Q24H   folic acid   1 mg Oral Daily   hydroxychloroquine   200 mg Oral BID   levothyroxine   50 mcg Oral QAC breakfast   multivitamin  1 tablet Oral QHS   pantoprazole   40 mg Oral Daily   polyethylene glycol  17 g Oral Daily   predniSONE   15 mg Oral Q breakfast   thiamine   100 mg Oral Daily    SUBJECTIVE: Seen on HD. Feeling about the same. Appetite is OK. Still some discomfort and fullness.   Review of Systems: Review of Systems  Constitutional:  Negative for chills and fever.  Respiratory:  Negative for cough, sputum production and shortness of breath.   Cardiovascular:  Negative for chest pain.  Gastrointestinal:  Positive for abdominal pain. Negative for diarrhea, nausea and vomiting.  Musculoskeletal:  Negative for back pain, myalgias and neck pain.    No Known Allergies  OBJECTIVE: Vitals:   12/28/23 1130 12/28/23 1200 12/28/23 1230 12/28/23 1238  BP: 122/83 127/76 (!) 147/71 (!) 154/72  Pulse: 70 68 69 70  Resp: 16 15 15 15   Temp:      TempSrc:      SpO2:  99% 100% 100% 100%  Weight:      Height:       Body mass index is 18.79 kg/m.  Physical Exam Constitutional:      Appearance: He is well-developed. He is not ill-appearing.  Abdominal:     General: Bowel sounds are normal. There is distension.     Tenderness: There is abdominal tenderness.  Skin:    General: Skin is warm.     Capillary Refill: Capillary refill takes less than 2 seconds.  Neurological:     Mental Status: He is oriented to person, place, and time.     Lab Results Lab Results  Component Value Date   WBC 4.2 12/27/2023   HGB 9.9 (L) 12/27/2023   HCT 31.1 (L) 12/27/2023   MCV 86.4 12/27/2023   PLT 115 (L) 12/27/2023    Lab Results  Component Value Date   CREATININE 5.60 (H) 12/27/2023   BUN 49 (H) 12/27/2023   NA 129 (L) 12/27/2023   K 5.3 (H) 12/27/2023   CL 92 (L) 12/27/2023   CO2 27 12/27/2023    Lab Results  Component Value Date   ALT 6 12/27/2023   AST 32 12/27/2023   ALKPHOS 83 12/27/2023   BILITOT 0.7 12/27/2023  Microbiology: Recent Results (from the past 240 hours)  Body fluid culture w Gram Stain     Status: None   Collection Time: 12/24/23 12:35 PM   Specimen: Abdomen; Peritoneal Fluid  Result Value Ref Range Status   Specimen Description PERITONEAL  Final   Special Requests NONE  Final   Gram Stain   Final    RARE WBC PRESENT,BOTH PMN AND MONONUCLEAR NO ORGANISMS SEEN    Culture   Final    NO GROWTH 3 DAYS Performed at [facility], [address], [place], [place] [address]    Report Status 12/27/2023 FINAL  Final  Body fluid culture w Gram Stain     Status: None (Preliminary result)   Collection Time: 12/27/23 10:49 AM   Specimen: Peritoneal Washings; Body Fluid  Result Value Ref Range Status   Specimen Description PERITONEAL  Final   Special Requests NONE  Final   Gram Stain   Final    MODERATE WBC PRESENT, PREDOMINANTLY PMN NO ORGANISMS SEEN    Culture   Final    NO GROWTH < 24 HOURS Performed at [facility], [address] [place], [place] [address]    Report Status PENDING  Incomplete  Culture, fungus without smear     Status: None (Preliminary result)   Collection Time: 12/27/23 10:49 AM   Specimen: Peritoneal Washings; Other  Result Value Ref Range Status   Specimen Description PERITONEAL  Final   Special Requests NONE  Final   Culture   Final    NO FUNGUS ISOLATED AFTER 1 DAY Performed at [facility], [address] [place], [place] [address]    Report Status PENDING  Incomplete     Corean Fireman, MSN, NP-C [facility]  [email or web address] Pager: [phone] Office: [phone] [facility] Main Line: [phone] *Secure Chat Communication Welcome  Total Encounter Time: ***

## 2023-12-28 NOTE — Progress Notes (Signed)
 Subjective:  Patient is status post paracentesis 9/1. Elevated neutrophilic wbc despite tx. Repeat paracentesis cx ngtd  Said pain is much better and so is appetite  Reviewed hx with him  On pred now 15 mg daily and plaquenil  for lupus  No recent flare needing boost immunosuppression  Dx'ed years ago  Pleural effusion thought to be due to it and also years ago. Esrd ?lupus  Ascites began 2017  No prior dx liver disease  No hx peritonitis  Klebsiella on this admission in the ascites and on appropriate abx  Saag < 1.1  Gi following     Antibiotics:  Anti-infectives (From admission, onward)    Start     Dose/Rate Route Frequency Ordered Stop   12/30/23 1200  ceFAZolin  (ANCEF ) IVPB 2g/100 mL premix        2 g 200 mL/hr over 30 Minutes Intravenous Every T-Th-Sa (Hemodialysis) 12/28/23 1518 01/11/24 1159   12/30/23 0000  ceFAZolin  (ANCEF ) IVPB        2 g Intravenous Every T-Th-Sa (Hemodialysis) 12/28/23 1522 01/08/24 2359   12/29/23 1200  ceFAZolin  (ANCEF ) IVPB 1 g/50 mL premix        1 g 100 mL/hr over 30 Minutes Intravenous Every 24 hours 12/28/23 1518 12/30/23 1159   12/22/23 1400  hydroxychloroquine  (PLAQUENIL ) tablet 200 mg        200 mg Oral 2 times daily 12/22/23 1252     12/22/23 1200  ceFAZolin  (ANCEF ) IVPB 1 g/50 mL premix  Status:  Discontinued        1 g 100 mL/hr over 30 Minutes Intravenous Every 24 hours 12/21/23 1039 12/28/23 1518   12/15/23 0800  cefTRIAXone  (ROCEPHIN ) 2 g in sodium chloride  0.9 % 100 mL IVPB  Status:  Discontinued        2 g 200 mL/hr over 30 Minutes Intravenous Every 24 hours 12/14/23 1740 12/21/23 1039   12/14/23 1745  metroNIDAZOLE  (FLAGYL ) IVPB 500 mg  Status:  Discontinued        500 mg 100 mL/hr over 60 Minutes Intravenous Every 12 hours 12/14/23 1740 12/17/23 1138   12/14/23 1545  cefTRIAXone  (ROCEPHIN ) 2 g in sodium chloride  0.9 % 100 mL IVPB        2 g 200 mL/hr over 30 Minutes Intravenous  Once  12/14/23 1534 12/14/23 1649       Medications: Scheduled Meds:  carvedilol   25 mg Oral BID   Chlorhexidine  Gluconate Cloth  6 each Topical Q0600   docusate sodium   100 mg Oral BID   doxercalciferol   2 mcg Intravenous Q T,Th,Sa-HD   feeding supplement (NEPRO CARB STEADY)  237 mL Oral Q24H   folic acid   1 mg Oral Daily   hydroxychloroquine   200 mg Oral BID   levothyroxine   50 mcg Oral QAC breakfast   multivitamin  1 tablet Oral QHS   pantoprazole   40 mg Oral Daily   polyethylene glycol  17 g Oral Daily   predniSONE   15 mg Oral Q breakfast   thiamine   100 mg Oral Daily   Continuous Infusions:  [START ON 12/29/2023]  ceFAZolin  (ANCEF ) IV     [START ON 12/30/2023]  ceFAZolin  (ANCEF ) IV     PRN Meds:.acetaminophen  **OR** [DISCONTINUED] acetaminophen , albuterol , bisacodyl , bisacodyl , melatonin, menthol -cetylpyridinium, ondansetron  **OR** ondansetron  (ZOFRAN ) IV, mouth rinse, oxyCODONE     Objective: Weight change:   Intake/Output Summary (Last 24 hours) at 12/28/2023 2311 Last data filed at 12/28/2023 1746  Gross per 24 hour  Intake 490 ml  Output 2500 ml  Net -2010 ml   Blood pressure (!) 169/78, pulse 73, temperature 98.1 F (36.7 C), resp. rate 18, height 5' 11 (1.803 m), weight 64.4 kg, SpO2 100%. Temp:  [97.4 F (36.3 C)-98.1 F (36.7 C)] 98.1 F (36.7 C) (09/02 2053) Pulse Rate:  [63-75] 73 (09/02 2053) Resp:  [11-18] 18 (09/02 2053) BP: (111-171)/(71-90) 169/78 (09/02 2053) SpO2:  [95 %-100 %] 100 % (09/02 2053) Weight:  [61.1 kg-66.9 kg] 64.4 kg (09/02 1242)  Physical Exam: Physical Exam Constitutional:      Appearance: He is well-developed.  HENT:     Head: Normocephalic and atraumatic.  Eyes:     Conjunctiva/sclera: Conjunctivae normal.  Cardiovascular:     Rate and Rhythm: Normal rate and regular rhythm.  Pulmonary:     Effort: Pulmonary effort is normal. No respiratory distress.     Breath sounds: No wheezing.  Abdominal:     General: There is no  distension.     Palpations: Abdomen is soft.  Musculoskeletal:        General: Normal range of motion.     Cervical back: Normal range of motion and neck supple.  Skin:    General: Skin is warm and dry.     Findings: No erythema or rash.  Neurological:     General: No focal deficit present.     Mental Status: He is alert and oriented to person, place, and time.  Psychiatric:        Mood and Affect: Mood normal.        Behavior: Behavior normal.        Thought Content: Thought content normal.        Judgment: Judgment normal.      CBC:    BMET Recent Labs    12/26/23 0727 12/27/23 0508  NA 129* 129*  K 4.7 5.3*  CL 94* 92*  CO2 25 27  GLUCOSE 83 90  BUN 37* 49*  CREATININE 4.45* 5.60*  CALCIUM  7.5* 7.8*     Liver Panel  Recent Labs    12/26/23 0727 12/27/23 0508  PROT 6.2* 6.3*  ALBUMIN  1.5* 1.6*  AST 36 32  ALT 6 6  ALKPHOS 92 83  BILITOT 0.4 0.7       Sedimentation Rate No results for input(s): ESRSEDRATE in the last 72 hours. C-Reactive Protein No results for input(s): CRP in the last 72 hours.  Micro Results: Recent Results (from the past 720 hours)  Body fluid culture w Gram Stain     Status: Abnormal   Collection Time: 12/14/23  3:02 PM   Specimen: Peritoneal Washings; Body Fluid  Result Value Ref Range Status   Specimen Description PERITONEAL  Final   Special Requests NONE  Final   Gram Stain   Final    FEW WBC PRESENT, PREDOMINANTLY PMN ABUNDANT GRAM NEGATIVE RODS Gram Stain Report Called to,Read Back By and Verified With: RN EMERSON MCARDLE [phone] @ 1747 FH Performed at [facility], [address], [place], [place] [address]    Culture KLEBSIELLA PNEUMONIAE (A)  Final   Report Status 12/16/2023 FINAL  Final   Organism ID, Bacteria KLEBSIELLA PNEUMONIAE  Final      Susceptibility   Klebsiella pneumoniae - MIC*    AMPICILLIN RESISTANT Resistant     CEFAZOLIN  (NON-URINE) 2 SENSITIVE Sensitive     CEFEPIME  <=0.12 SENSITIVE  Sensitive     ERTAPENEM <=0.12 SENSITIVE  Sensitive     CEFTRIAXONE  <=0.25 SENSITIVE Sensitive     CIPROFLOXACIN <=0.06 SENSITIVE Sensitive     GENTAMICIN <=1 SENSITIVE Sensitive     MEROPENEM <=0.25 SENSITIVE Sensitive     TRIMETH /SULFA  <=20 SENSITIVE Sensitive     AMPICILLIN/SULBACTAM <=2 SENSITIVE Sensitive     PIP/TAZO Value in next row Sensitive ug/mL     <=4 SENSITIVEThis is a modified FDA-approved test that has been validated and its performance characteristics determined by the reporting laboratory.  This laboratory is certified under the Clinical Laboratory Improvement Amendments CLIA as qualified to perform high complexity clinical laboratory testing.    * KLEBSIELLA PNEUMONIAE  Blood culture (routine x 2)     Status: None   Collection Time: 12/14/23  3:50 PM   Specimen: BLOOD  Result Value Ref Range Status   Specimen Description BLOOD SITE NOT SPECIFIED  Final   Special Requests   Final    BOTTLES DRAWN AEROBIC AND ANAEROBIC Blood Culture results may not be optimal due to an inadequate volume of blood received in culture bottles   Culture   Final    NO GROWTH 5 DAYS Performed at [facility], [address] [place], [place] [address]    Report Status 12/19/2023 FINAL  Final  Blood culture (routine x 2)     Status: None   Collection Time: 12/14/23  3:58 PM   Specimen: BLOOD  Result Value Ref Range Status   Specimen Description BLOOD SITE NOT SPECIFIED  Final   Special Requests   Final    BOTTLES DRAWN AEROBIC AND ANAEROBIC Blood Culture results may not be optimal due to an inadequate volume of blood received in culture bottles   Culture   Final    NO GROWTH 5 DAYS Performed at [facility], [address] [place], [place] [address]    Report Status 12/19/2023 FINAL  Final  Body fluid culture w Gram Stain     Status: None   Collection Time: 12/24/23 12:35 PM   Specimen: Abdomen; Peritoneal Fluid  Result Value Ref Range Status   Specimen Description  PERITONEAL  Final   Special Requests NONE  Final   Gram Stain   Final    RARE WBC PRESENT,BOTH PMN AND MONONUCLEAR NO ORGANISMS SEEN    Culture   Final    NO GROWTH 3 DAYS Performed at [facility], [address] [place], [place] [address]    Report Status 12/27/2023 FINAL  Final  Body fluid culture w Gram Stain     Status: None (Preliminary result)   Collection Time: 12/27/23 10:49 AM   Specimen: Peritoneal Washings; Body Fluid  Result Value Ref Range Status   Specimen Description PERITONEAL  Final   Special Requests NONE  Final   Gram Stain   Final    MODERATE WBC PRESENT, PREDOMINANTLY PMN NO ORGANISMS SEEN    Culture   Final    NO GROWTH < 24 HOURS Performed at [facility], [address], [place], [place] [address]    Report Status PENDING  Incomplete  Culture, fungus without smear     Status: None (Preliminary result)   Collection Time: 12/27/23 10:49 AM   Specimen: Peritoneal Washings; Other  Result Value Ref Range Status   Specimen Description PERITONEAL  Final   Special Requests NONE  Final   Culture   Final    NO FUNGUS ISOLATED AFTER 1 DAY Performed at [facility], [address] [place], [place] [address]    Report Status PENDING  Incomplete    Studies/Results: US  Paracentesis Result Date: 12/27/2023 INDICATION: Patient in the hospital with spontaneous bacterial peritonitis. History of recurrent ascites. IR consulted for diagnostic and therapeutic paracentesis. EXAM: ULTRASOUND GUIDED DIAGNOSTIC AND THERAPEUTIC PARACENTESIS MEDICATIONS: 6 mL 1% lidocaine . COMPLICATIONS: None immediate. PROCEDURE: Informed written consent was obtained from the patient after a discussion of the risks, benefits and alternatives to treatment. A timeout was performed prior to the initiation of the procedure. Initial ultrasound scanning demonstrates a moderate amount of ascites within the left lower abdominal quadrant. The left lower abdomen was prepped and draped  in the usual sterile fashion. 1% lidocaine  was used for local anesthesia. Following this, a 6 Fr Safe-T-Centesis catheter was introduced. An ultrasound image was saved for documentation purposes. The paracentesis was performed. The catheter was removed and a dressing was applied. The patient tolerated the procedure well without immediate post procedural complication. FINDINGS: A total of approximately 920 mL of hazy tan fluid was removed. Samples were sent to the laboratory as requested by the clinical team. IMPRESSION: Successful ultrasound-guided paracentesis yielding 920 mL of peritoneal fluid. Performed by: Wyatt Pommier, PA-C Electronically Signed   By: Juliene Balder M.D.   On: 12/27/2023 11:49      Assessment/Plan:  INTERVAL HISTORY: He is status post paracentesis   Principal Problem:   Spontaneous bacterial peritonitis (HCC) Active Problems:   Systemic lupus erythematosus with lung involvement (HCC)   Pancytopenia (HCC)   Chronic systolic CHF (congestive heart failure) (HCC)   Acquired hypothyroidism   ESRD on dialysis (HCC)   Ascites   Essential hypertension   Hyperkalemia    [name] is a 53 y.o. male with ascites, thought NOT to be due to cirrhosis but in context of SLE, hypoalbuminemia, with apparent SBP with clinical improvement but worsening WBC on paracentesis and increased LDH  He DID have an SLE flare during this admission  #1 Peritonitis:  IF this elevation in WBC in peritoneum was due to AMP C + from Klebsiella he should be growing the klebsiella--which he is not  CT chest abdomen and pelvis shows moderate ascites, pleural effusion, mild splenomegalh but nothing on imaging to explain why his WBC in peritoneum went up  We are entertaining other causes of peritonitis including from SLE, other reasons for fluctuation of WBC including HD, steroids and contemplating TB  He is now status post paracentesis  Cell count differential are pending as to his repeat  culture which will undoubtably be negative in my opinion I am also ensuring that he has a large volume of fluid used to send for AFB culture and have discussed with Darquita in micro lab  He can continue on cefazolin  and if he is doing well could change over to renally dosed cefadroxil to complete a total of 10 days of antibacterial antibiotics  ------------------ 12/28/23 id assessment Symptomatically better with appropriate therapy for klebsiella in the ascites Question of cirrhosis development (previous ascites without hx of cirrhosis)  Did note mention of ascites being milky  I reviewed and discussed with dr Fleeta Rothman thoughts process for his ascites.... 1) chronic prolonged nature wihout other significant progression in terms of chronic infection or sign of malignancy 2) low saag 3) in setting known lupus 4) milky appearance     Dr Fleeta Rothman had placed due-dilligance diagnostic suggestions from 9/01 addendum regarding fungal/tb and potentially peritoneal biopsy for such.... we had reviewed chronic nature  without other s/s rather atypical for these   I am concern clinically/temporally of what appears to be complication of potentially chylous ascites and this could be blockage of lymphatic/thoracic duct drainage. Lupus seems to be a common denominator here  -maintain standard isolation precaution -consider sending chylomicrons, triglyceride (or if lab ok then addon to 9/1).  -consider discussion with rheum about lupus and relationship to ascites and potentially chylous ascites -agree with w/u for cirrhosis as GI suggested -It would be overkill but to put any lingering doubt that this is due to klebsiella finish 3 weeks cefazolin  with dialysis -crypto peritonitis is a known thing in cirrhosis and serology sent by dr Fleeta Rothman 9/1 -id will sign off -discussed with primary team    LOS: 14 days   Alesha Jaffee T Jonnell Hentges 12/28/2023, 11:11 PM

## 2023-12-28 NOTE — Progress Notes (Signed)
 Inpatient Progress Note     Patient Profile/Chief Complaint  53 y.o. male with history of end-stage renal disease on hemodialysis, lupus, congestive heart failure with reduced EF/35 to 40%, chronic ascites without definitive cirrhosis felt to be secondary to renal failure and hypoalbuminemia admitted with generalized acute abdominal pain and rapid accumulation of ascites with cultures positive for Klebsiella consistent with acute bacterial peritonitis.  Treated with cephalosporin.  Repeat paracentesis 12/27/23 showed significantly elevated cell count 57,750, 93% neutrophils; fluid with a milky consistency.  Being evaluated by infectious disease for fungal etiologies as well as TB.  malignancy remains a consideration.   Interval History   -- Reports improvement in abdominal pain since paracentesis yesterday and fluid removal -- Appetite also improved with fluid removal and less distention of abdomen   Objective   Vital signs in last 24 hours: Temp:  [97.4 F (36.3 C)-98.1 F (36.7 C)] 97.7 F (36.5 C) (09/02 1643) Pulse Rate:  [63-75] 75 (09/02 1643) Resp:  [11-18] 18 (09/02 1643) BP: (111-171)/(71-91) 168/82 (09/02 1643) SpO2:  [93 %-100 %] 100 % (09/02 1643) Weight:  [61.1 kg-66.9 kg] 64.4 kg (09/02 1242) Last BM Date : 12/27/23 General:    Chronically ill-appearing gentleman resting quietly in bed Heart:  Regular rate and rhythm; no murmurs Lungs: Respirations even and unlabored, lungs CTA bilaterally Abdomen:  Soft, mildly distended, minimal tenderness to palpation normal bowel sounds. Extremities: Trace bilateral lower extremity edema Neurologic:  Alert and oriented,  grossly normal neurologically. Psych:  Cooperative. Normal mood and affect.  Intake/Output from previous day: 09/01 0701 - 09/02 0700 In: 400 [P.O.:200; IV Piggyback:200] Out: -  Intake/Output this shift: Total I/O In: 490 [P.O.:490] Out: 2500 [Other:2500]  Lab Results: Recent Labs    12/26/23 0727  12/27/23 0508  WBC 5.0 4.2  HGB 9.6* 9.9*  HCT 30.7* 31.1*  PLT 103* 115*   BMET Recent Labs    12/26/23 0727 12/27/23 0508  NA 129* 129*  K 4.7 5.3*  CL 94* 92*  CO2 25 27  GLUCOSE 83 90  BUN 37* 49*  CREATININE 4.45* 5.60*  CALCIUM  7.5* 7.8*   LFT Recent Labs    12/27/23 0508  PROT 6.3*  ALBUMIN  1.6*  AST 32  ALT 6  ALKPHOS 83  BILITOT 0.7   PT/INR No results for input(s): LABPROT, INR in the last 72 hours.  Studies/Results: US  Paracentesis Result Date: 12/27/2023 INDICATION: Patient in the hospital with spontaneous bacterial peritonitis. History of recurrent ascites. IR consulted for diagnostic and therapeutic paracentesis. EXAM: ULTRASOUND GUIDED DIAGNOSTIC AND THERAPEUTIC PARACENTESIS MEDICATIONS: 6 mL 1% lidocaine . COMPLICATIONS: None immediate. PROCEDURE: Informed written consent was obtained from the patient after a discussion of the risks, benefits and alternatives to treatment. A timeout was performed prior to the initiation of the procedure. Initial ultrasound scanning demonstrates a moderate amount of ascites within the left lower abdominal quadrant. The left lower abdomen was prepped and draped in the usual sterile fashion. 1% lidocaine  was used for local anesthesia. Following this, a 6 Fr Safe-T-Centesis catheter was introduced. An ultrasound image was saved for documentation purposes. The paracentesis was performed. The catheter was removed and a dressing was applied. The patient tolerated the procedure well without immediate post procedural complication. FINDINGS: A total of approximately 920 mL of hazy tan fluid was removed. Samples were sent to the laboratory as requested by the clinical team. IMPRESSION: Successful ultrasound-guided paracentesis yielding 920 mL of peritoneal fluid. Performed by: Wyatt Pommier, PA-C Electronically Signed  By: Juliene Balder M.D.   On: 12/27/2023 11:49    Endoscopic Studies: None   Clinical Impression   53 y.o. male with  history of end-stage renal disease on hemodialysis, lupus, congestive heart failure with reduced EF/35 to 40%, chronic ascites without definitive cirrhosis felt to be secondary to renal failure and hypoalbuminemia admitted with generalized acute abdominal pain and rapid accumulation of ascites with cultures positive for Klebsiella consistent with acute bacterial peritonitis.  Treated with cephalosporin.  Repeat paracentesis 12/27/23 showed significantly elevated cell count 57,750, 93% neutrophils; fluid with a milky consistency.  Being evaluated by infectious disease for fungal etiologies as well as TB.  Malignancy remains a consideration.  CT imaging 12/25/2023 shows question of subcapsular nodularity of the liver that can potentially be seen in the setting of cirrhosis, mild splenomegaly and tortuous splenic vein potentially related to portal hypertension which appear to be new findings. Previous SAAG was low at 0.8 suggesting absence of portal hypertension.  Possible previous history of alcohol consumption.  Fib 4 elevated.  Possibility of evolving cirrhosis.   Plan  Follow-up cultures from most recent paracentesis 12/27/2023 Continue cefazolin  Will consider elastography in outpatient setting. Pending remainder of workup will consider laboratory evaluation for chronic hepatitides.    LOS: 14 days   [name]  12/28/2023, 6:00 PM  Angel Hausen, MD [facility]

## 2023-12-28 NOTE — Progress Notes (Signed)
 [facility] Progress Note   Subjective:   Seen on HD, 2.5L UFG - tolerating so far. S/p repeat paracentesis yesterday - cell count drastically higher. ID following and sending for fungal Cx/TB testing and peritoneal Bx being considered. No CP/dyspnea.  Objective Vitals:   12/28/23 0500 12/28/23 0758 12/28/23 0803 12/28/23 0833  BP: (!) 129/90 132/81  139/79  Pulse: 64 65  64  Resp: 18 18  15   Temp: 97.7 F (36.5 C) (!) 97.4 F (36.3 C)  98.1 F (36.7 C)  TempSrc: Oral Oral    SpO2: 95% 100%  99%  Weight:   66.9 kg 61.1 kg  Height:       Physical Exam General: Chronically ill appearing, NAD. Room air Heart: RRR; no murmur Lungs: CTA anteriorly Abdomen: distended, mild TTP Extremities: trace BLE edema Dialysis Access: LUE AVF +t/b    Additional Objective Labs: Basic Metabolic Panel: Recent Labs  Lab 12/22/23 0441 12/23/23 0605 12/25/23 0854 12/26/23 0727 12/27/23 0508  NA 130*   < > 128* 129* 129*  K 4.2   < > 5.2* 4.7 5.3*  CL 94*   < > 92* 94* 92*  CO2 26   < > 26 25 27   GLUCOSE 129*   < > 120* 83 90  BUN 34*   < > 53* 37* 49*  CREATININE 5.03*   < > 6.04* 4.45* 5.60*  CALCIUM  7.6*   < > 7.7* 7.5* 7.8*  PHOS 4.6  --  6.2*  --   --    < > = values in this interval not displayed.   Liver Function Tests: Recent Labs  Lab 12/24/23 0414 12/25/23 0854 12/26/23 0727 12/27/23 0508  AST 28  --  36 32  ALT 10  --  6 6  ALKPHOS 89  --  92 83  BILITOT 0.6  --  0.4 0.7  PROT 6.6  --  6.2* 6.3*  ALBUMIN  1.6* 1.5* 1.5* 1.6*   CBC: Recent Labs  Lab 12/23/23 0605 12/24/23 0414 12/25/23 0853 12/26/23 0727 12/27/23 0508  WBC 8.5 6.5 5.8 5.0 4.2  NEUTROABS 6.4 5.3  --  4.6 3.2  HGB 10.9* 10.4* 9.2* 9.6* 9.9*  HCT 33.9* 32.6* 29.1* 30.7* 31.1*  MCV 86.9 87.6 87.4 88.7 86.4  PLT 92* 88* 100* 103* 115*   Studies/Results: US  Paracentesis Result Date: 12/27/2023 INDICATION: Patient in the hospital with spontaneous bacterial peritonitis. History  of recurrent ascites. IR consulted for diagnostic and therapeutic paracentesis. EXAM: ULTRASOUND GUIDED DIAGNOSTIC AND THERAPEUTIC PARACENTESIS MEDICATIONS: 6 mL 1% lidocaine . COMPLICATIONS: None immediate. PROCEDURE: Informed written consent was obtained from the patient after a discussion of the risks, benefits and alternatives to treatment. A timeout was performed prior to the initiation of the procedure. Initial ultrasound scanning demonstrates a moderate amount of ascites within the left lower abdominal quadrant. The left lower abdomen was prepped and draped in the usual sterile fashion. 1% lidocaine  was used for local anesthesia. Following this, a 6 Fr Safe-T-Centesis catheter was introduced. An ultrasound image was saved for documentation purposes. The paracentesis was performed. The catheter was removed and a dressing was applied. The patient tolerated the procedure well without immediate post procedural complication. FINDINGS: A total of approximately 920 mL of hazy tan fluid was removed. Samples were sent to the laboratory as requested by the clinical team. IMPRESSION: Successful ultrasound-guided paracentesis yielding 920 mL of peritoneal fluid. Performed by: Wyatt Pommier, PA-C Electronically Signed   By: Juliene Philip HERO.D.  On: 12/27/2023 11:49   Medications:   ceFAZolin  (ANCEF ) IV Stopped (12/27/23 1216)    carvedilol   25 mg Oral BID   Chlorhexidine  Gluconate Cloth  6 each Topical Q0600   docusate sodium   100 mg Oral BID   doxercalciferol   2 mcg Intravenous Q T,Th,Sa-HD   feeding supplement (NEPRO CARB STEADY)  237 mL Oral Q24H   folic acid   1 mg Oral Daily   hydroxychloroquine   200 mg Oral BID   levothyroxine   50 mcg Oral QAC breakfast   multivitamin  1 tablet Oral QHS   pantoprazole   40 mg Oral Daily   polyethylene glycol  17 g Oral Daily   predniSONE   15 mg Oral Q breakfast   thiamine   100 mg Oral Daily    Dialysis Orders TTS - AF 3:45hr, 400/A1.5, EDW 71kg, 2K/2C bath. AVF, no  heparin  - Mircera 150mcg IV q 2 weeks (last 8/12) - Hectoral 2mcg IV q HD   Assessment/Plan: SBP: Fluid Cx + Klebsiella, on course of Cefazolin . Symptoms much improved, btu repeat peritoneal cell counts climbing with very high LDH and negative bacterial Cx. ID involved -> fungal and TB testing in process. Malignancy also being considered. AMS: Infection v. alcohol withdrawal, per primary. ESRD: Continue HD on TTS schedule - HD now. HTN/volume: BP stable now - ARB and hydralazine d/c'd - remains on BB, UF as tolerated. Anemia of ESRD: Hgb 9.9 - follow labs. Secondary HPTH: CorrCa/Phos ok - resumed hectoral q HD, not getting binders here Nutrition: Alb very low, continue protein supps. SLE: C3/4 low, dsDNA high - c/w flare - on plaquenil  and prednisone  taper - feels better. Recurrent ascites: Gets intermittent paracenteses since 2022.     Izetta Boehringer, PA-C 12/28/2023, 8:57 AM  [employer]

## 2023-12-29 ENCOUNTER — Inpatient Hospital Stay (HOSPITAL_COMMUNITY)

## 2023-12-29 DIAGNOSIS — E039 Hypothyroidism, unspecified: Secondary | ICD-10-CM | POA: Diagnosis not present

## 2023-12-29 DIAGNOSIS — I5022 Chronic systolic (congestive) heart failure: Secondary | ICD-10-CM | POA: Diagnosis not present

## 2023-12-29 DIAGNOSIS — B961 Klebsiella pneumoniae [K. pneumoniae] as the cause of diseases classified elsewhere: Secondary | ICD-10-CM | POA: Diagnosis not present

## 2023-12-29 DIAGNOSIS — K652 Spontaneous bacterial peritonitis: Secondary | ICD-10-CM | POA: Diagnosis not present

## 2023-12-29 DIAGNOSIS — E875 Hyperkalemia: Secondary | ICD-10-CM

## 2023-12-29 DIAGNOSIS — N186 End stage renal disease: Secondary | ICD-10-CM | POA: Diagnosis not present

## 2023-12-29 LAB — CBC WITH DIFFERENTIAL/PLATELET
Abs Immature Granulocytes: 0.14 K/uL — ABNORMAL HIGH (ref 0.00–0.07)
Basophils Absolute: 0 K/uL (ref 0.0–0.1)
Basophils Relative: 1 %
Eosinophils Absolute: 0 K/uL (ref 0.0–0.5)
Eosinophils Relative: 1 %
HCT: 27.7 % — ABNORMAL LOW (ref 39.0–52.0)
Hemoglobin: 9 g/dL — ABNORMAL LOW (ref 13.0–17.0)
Immature Granulocytes: 4 %
Lymphocytes Relative: 11 %
Lymphs Abs: 0.4 K/uL — ABNORMAL LOW (ref 0.7–4.0)
MCH: 28 pg (ref 26.0–34.0)
MCHC: 32.5 g/dL (ref 30.0–36.0)
MCV: 86.3 fL (ref 80.0–100.0)
Monocytes Absolute: 0.3 K/uL (ref 0.1–1.0)
Monocytes Relative: 7 %
Neutro Abs: 2.9 K/uL (ref 1.7–7.7)
Neutrophils Relative %: 76 %
Platelets: 104 K/uL — ABNORMAL LOW (ref 150–400)
RBC: 3.21 MIL/uL — ABNORMAL LOW (ref 4.22–5.81)
RDW: 16.6 % — ABNORMAL HIGH (ref 11.5–15.5)
WBC: 3.7 K/uL — ABNORMAL LOW (ref 4.0–10.5)
nRBC: 0 % (ref 0.0–0.2)

## 2023-12-29 LAB — QUANTIFERON-TB GOLD PLUS (RQFGPL)
QuantiFERON Mitogen Value: 0.24 [IU]/mL
QuantiFERON Nil Value: 0.07 [IU]/mL
QuantiFERON TB1 Ag Value: 0.08 [IU]/mL
QuantiFERON TB2 Ag Value: 0.07 [IU]/mL

## 2023-12-29 LAB — COMPREHENSIVE METABOLIC PANEL WITH GFR
ALT: <5 U/L (ref 0–44)
AST: 31 U/L (ref 15–41)
Albumin: 1.6 g/dL — ABNORMAL LOW (ref 3.5–5.0)
Alkaline Phosphatase: 75 U/L (ref 38–126)
Anion gap: 8 (ref 5–15)
BUN: 40 mg/dL — ABNORMAL HIGH (ref 6–20)
CO2: 28 mmol/L (ref 22–32)
Calcium: 7.1 mg/dL — ABNORMAL LOW (ref 8.9–10.3)
Chloride: 92 mmol/L — ABNORMAL LOW (ref 98–111)
Creatinine, Ser: 5 mg/dL — ABNORMAL HIGH (ref 0.61–1.24)
GFR, Estimated: 13 mL/min — ABNORMAL LOW (ref >60)
Glucose, Bld: 91 mg/dL (ref 70–99)
Potassium: 4.5 mmol/L (ref 3.5–5.1)
Sodium: 128 mmol/L — ABNORMAL LOW (ref 135–145)
Total Bilirubin: 0.5 mg/dL (ref 0.0–1.2)
Total Protein: 5.9 g/dL — ABNORMAL LOW (ref 6.5–8.1)

## 2023-12-29 LAB — QUANTIFERON-TB GOLD PLUS: QuantiFERON-TB Gold Plus: UNDETERMINED — AB

## 2023-12-29 MED ORDER — ALUM & MAG HYDROXIDE-SIMETH 200-200-20 MG/5ML PO SUSP
30.0000 mL | ORAL | Status: DC | PRN
  Administered 2023-12-29: 30 mL via ORAL
  Filled 2023-12-29 (×2): qty 30

## 2023-12-29 NOTE — Progress Notes (Signed)
   12/29/23 1000  Spiritual Encounters  Type of Visit Initial  Care provided to: Pt and family  Referral source Patient request  Reason for visit Advance directives  OnCall Visit Yes  Interventions  Spiritual Care Interventions Made Established relationship of care and support;Compassionate presence;Reflective listening;Encouragement  Intervention Outcomes  Outcomes Awareness of support  Spiritual Care Plan  Spiritual Care Issues Still Outstanding No further spiritual care needs at this time (see row info)   Chaplain responded to Spiritual Consult for Advance Care Directive.  Chaplain arrived in room and delivered ACD education. Instructed Pt how to reach out to Spiritual Care if they have any questions and to contact us  when they are ready to move forward.  Chaplain services remain available as the need arises by Spiritual Consult or for emergent cases, paging [phone].  Chaplain Kie Calvin, LPN, CHC, ORDM

## 2023-12-29 NOTE — Progress Notes (Signed)
 [facility] Progress Note   Subjective:   Seen in room - feeling ok today, no new issues. Abd pain better, eating ok. ID now considering chylous ascites per notes, they have signed off - plan is finish 3 week course of Cefazolin  and awaiting rest of tests/Cx to return.  Objective Vitals:   12/28/23 2053 12/29/23 0454 12/29/23 0600 12/29/23 0733  BP: (!) 169/78 (!) 162/65  (!) 145/57  Pulse: 73 65  64  Resp: 18 18  18   Temp: 98.1 F (36.7 C) 98.3 F (36.8 C)  98.3 F (36.8 C)  TempSrc:    Oral  SpO2: 100% 100%  99%  Weight:   61.7 kg   Height:       Physical Exam General: Chronically ill appearing, NAD. Room air Heart: RRR; no murmur Lungs: CTA anteriorly Abdomen: distended, mild TTP Extremities: trace BLE edema Dialysis Access: LUE AVF +t/b   Additional Objective Labs: Basic Metabolic Panel: Recent Labs  Lab 12/25/23 0854 12/26/23 0727 12/27/23 0508 12/29/23 0423  NA 128* 129* 129* 128*  K 5.2* 4.7 5.3* 4.5  CL 92* 94* 92* 92*  CO2 26 25 27 28   GLUCOSE 120* 83 90 91  BUN 53* 37* 49* 40*  CREATININE 6.04* 4.45* 5.60* 5.00*  CALCIUM  7.7* 7.5* 7.8* 7.1*  PHOS 6.2*  --   --   --    Liver Function Tests: Recent Labs  Lab 12/26/23 0727 12/27/23 0508 12/29/23 0423  AST 36 32 31  ALT 6 6 <5  ALKPHOS 92 83 75  BILITOT 0.4 0.7 0.5  PROT 6.2* 6.3* 5.9*  ALBUMIN  1.5* 1.6* 1.6*   CBC: Recent Labs  Lab 12/24/23 0414 12/25/23 0853 12/26/23 0727 12/27/23 0508 12/29/23 0423  WBC 6.5 5.8 5.0 4.2 3.7*  NEUTROABS 5.3  --  4.6 3.2 2.9  HGB 10.4* 9.2* 9.6* 9.9* 9.0*  HCT 32.6* 29.1* 30.7* 31.1* 27.7*  MCV 87.6 87.4 88.7 86.4 86.3  PLT 88* 100* 103* 115* 104*   Blood Culture    Component Value Date/Time   SDES PERITONEAL 12/27/2023 1049   SDES PERITONEAL 12/27/2023 1049   SPECREQUEST NONE 12/27/2023 1049   SPECREQUEST NONE 12/27/2023 1049   CULT  12/27/2023 1049    NO GROWTH 2 DAYS Performed at [facility], [address],  [place], [place] [address]    CULT  12/27/2023 1049    NO FUNGUS ISOLATED AFTER 1 DAY Performed at [facility], [address] [place], [place] [address]    REPTSTATUS PENDING 12/27/2023 1049   REPTSTATUS PENDING 12/27/2023 1049   Medications:   ceFAZolin  (ANCEF ) IV     [START ON 12/30/2023]  ceFAZolin  (ANCEF ) IV      carvedilol   25 mg Oral BID   Chlorhexidine  Gluconate Cloth  6 each Topical Q0600   docusate sodium   100 mg Oral BID   doxercalciferol   2 mcg Intravenous Q T,Th,Sa-HD   feeding supplement (NEPRO CARB STEADY)  237 mL Oral Q24H   folic acid   1 mg Oral Daily   hydroxychloroquine   200 mg Oral BID   levothyroxine   50 mcg Oral QAC breakfast   multivitamin  1 tablet Oral QHS   pantoprazole   40 mg Oral Daily   polyethylene glycol  17 g Oral Daily   predniSONE   15 mg Oral Q breakfast   thiamine   100 mg Oral Daily    Dialysis Orders TTS - AF 3:45hr, 400/A1.5, EDW 71kg, 2K/2C bath. AVF, no  heparin  - Mircera 150mcg IV q 2 weeks (last 8/12) - Hectoral 2mcg IV q HD   Assessment/Plan: SBP: Fluid Cx + Klebsiella, on course of Cefazolin . Symptoms much improved, btu repeat peritoneal cell counts climbing with very high LDH and negative bacterial Cx. ID involved -> fungal and TB testing in process, malignancy as well as chylous ascites being considered. Will be finishing 3 week course of Cefazolin . AMS: Infection v. alcohol withdrawal, resolved. ESRD: Continue HD on TTS schedule - next HD tomorrow. HTN/volume: BP stable now - ARB and hydralazine  d/c'd - remains on BB, UF as tolerated. Anemia of ESRD: Hgb 9 - will order ESA. Secondary HPTH: CorrCa/Phos ok - resumed hectoral q HD, not getting binders here Nutrition: Alb very low, continue protein supps. SLE: C3/4 low, dsDNA high - c/w flare - on plaquenil  and prednisone  taper - feels better. Recurrent ascites: Gets intermittent paracenteses since 2022.   Angel Boehringer, PA-C 12/29/2023, 10:47 AM  [employer]

## 2023-12-29 NOTE — TOC Progression Note (Addendum)
 Transition of Care [facility]) - Progression Note    Patient Details  Name: [name] MRN: [id number] Date of Birth: 1971/04/01  [facility]) CM/SW Contact  Tom-Johnson, Lathyn Griggs Daphne, RN Phone Number: 12/29/2023, 11:23 AM  Clinical Narrative:     CM spoke with patient and sister, [name] at bedside about Home Health recommendations. Patient declined, states he has been ambulating with mobility tech without any assistive aids and will do fine when he gets home. States he lives alone but his sister, [name] and cousins will check on him frequently and as needed.  Patient also declined DME's recommended, states he does not need them and they will be in his way.  CM informed patient to notify CM if he decides to go home with Home Health and if he needs DME recommended.  CM will continue to follow as patient progresses with care towards discharge.          Expected Discharge Plan: Home/Self Care Barriers to Discharge: Continued Medical Work up               Expected Discharge Plan and Services In-house Referral: Clinical Social Work     Living arrangements for the past 2 months: Single Family Home                                       Social Drivers of Health (SDOH) Interventions SDOH Screenings   Food Insecurity: No Food Insecurity (12/15/2023)  Housing: Low Risk  (12/15/2023)  Transportation Needs: No Transportation Needs (12/15/2023)  Utilities: Not At Risk (12/15/2023)  Depression (PHQ2-9): Low Risk  (01/02/2022)  Tobacco Use: Low Risk  (12/14/2023)    Readmission Risk Interventions     No data to display

## 2023-12-29 NOTE — Progress Notes (Signed)
 Physical Therapy Treatment Patient Details Name: ASIAH BEFORT MRN: [id number] DOB: [birth year] Today's Date: 12/29/2023   History of Present Illness Pt is a 53 y/o M admitted on 12/14/23 after presenting with c/o abdominal pain. Pt is being treated for spontaneous bacterial peritonitis, acute metabolic encephalopathy. PMH: ESRD on HD TTS, lupus, ascites, systolic CHF    PT Comments  Continuing work on functional mobility and activity tolerance;  Session focused on progressive amb; Pt declined the opportunity to practice with a rollaotr RW, although it can be useful for balance (first and foremost), to provide a seat if pt gets fatigued with community ambulation, and can help with carrying items; Pt's gait is inefficient and stiff, PT's official rec is for a rollator, but I anticipate he will decline    If plan is discharge home, recommend the following: Direct supervision/assist for medications management;Help with stairs or ramp for entrance;Assist for transportation;Assistance with cooking/housework;Direct supervision/assist for financial management;Supervision due to cognitive status;A little help with walking and/or transfers;A little help with bathing/dressing/bathroom   Can travel by private vehicle     Yes  Equipment Recommendations  BSC/3in1;Wheelchair (measurements PT);Wheelchair cushion (measurements PT);Rollator (4 wheels) (consider wc for community access)    Recommendations for Other Services       Precautions / Restrictions Precautions Precautions: Fall Recall of Precautions/Restrictions: Impaired Restrictions Weight Bearing Restrictions Per Provider Order: No     Mobility  Bed Mobility                    Transfers Overall transfer level: Needs assistance Equipment used: None Transfers: Sit to/from Stand Sit to Stand: Contact guard assist           General transfer comment: CGA for sit to stand and for transfers, patient asking to perform mobility  without RW with CGA for safety    Ambulation/Gait Ambulation/Gait assistance: Contact guard assist Gait Distance (Feet): 120 Feet Assistive device: None Gait Pattern/deviations: Wide base of support, Decreased weight shift to right, Decreased weight shift to left, Decreased step length - right, Decreased step length - left       General Gait Details: Pt takes slow, small steps with a flexed posture but did not have any knee buckling this date. Stiff foot falls, inefficient gait,  and decr trunk rotation during walk; noted 1 loss of balance; refused rollator trial   Stairs             Wheelchair Mobility     Tilt Bed    Modified Rankin (Stroke Patients Only)       Balance     Sitting balance-Leahy Scale: Good       Standing balance-Leahy Scale: Fair                              Hotel manager: Impaired Factors Affecting Communication:  (hoarse voice)  Cognition Arousal: Alert Behavior During Therapy: Flat affect   PT - Cognitive impairments: No family/caregiver present to determine baseline                       PT - Cognition Comments: Pt much more alert and appropriate in conversation and staying on track. Assume to be close to if not at baseline, but no family present to confirm at this time. Following commands: Intact      Cueing Cueing Techniques: Verbal cues, Tactile cues  Exercises  General Comments General comments (skin integrity, edema, etc.): Discussed rationale and potential uses for rollator; pt voices understanding      Pertinent Vitals/Pain Pain Assessment Pain Assessment: No/denies pain Pain Intervention(s): Monitored during session    Home Living                          Prior Function            PT Goals (current goals can now be found in the care plan section) Acute Rehab PT Goals Patient Stated Goal: Be abel to get home PT Goal Formulation: With patient Time  For Goal Achievement: 01/03/24 Potential to Achieve Goals: Fair Progress towards PT goals: Progressing toward goals    Frequency    Min 2X/week      PT Plan      Co-evaluation              AM-PAC PT 6 Clicks Mobility   Outcome Measure  Help needed turning from your back to your side while in a flat bed without using bedrails?: None Help needed moving from lying on your back to sitting on the side of a flat bed without using bedrails?: None Help needed moving to and from a bed to a chair (including a wheelchair)?: None Help needed standing up from a chair using your arms (e.g., wheelchair or bedside chair)?: None Help needed to walk in hospital room?: A Little Help needed climbing 3-5 steps with a railing? : A Little 6 Click Score: 22    End of Session   Activity Tolerance: Patient tolerated treatment well Patient left: in bed;with call bell/phone within reach Nurse Communication: Mobility status PT Visit Diagnosis: Unsteadiness on feet (R26.81);Other abnormalities of gait and mobility (R26.89);Difficulty in walking, not elsewhere classified (R26.2);Muscle weakness (generalized) (M62.81)     Time: 8590-8569 PT Time Calculation (min) (ACUTE ONLY): 21 min  Charges:    $Gait Training: 8-22 mins PT General Charges $$ ACUTE PT VISIT: 1 Visit                     Silvano Currier, PT  Acute Rehabilitation Services Office [phone] Secure Chat welcomed    Silvano VEAR Currier 12/29/2023, 5:42 PM

## 2023-12-29 NOTE — Progress Notes (Signed)
 Inpatient Progress Note     Patient Profile/Chief Complaint  53 y.o. male with history of end-stage renal disease on hemodialysis, lupus, congestive heart failure with reduced EF/35 to 40%, chronic ascites without definitive cirrhosis felt to be secondary to renal failure and hypoalbuminemia admitted with generalized acute abdominal pain and rapid accumulation of ascites with cultures positive for Klebsiella consistent with acute bacterial peritonitis.  Treated with cephalosporin.  Repeat paracentesis 12/27/23 showed significantly elevated cell count 57,750, 93% neutrophils; fluid with a milky consistency.  Being evaluated by infectious disease for fungal etiologies as well as TB.  Malignancy remains a consideration.   Interval History   -- Abdominal pain was stable this morning but reports worsening after abdominal ultrasound this afternoon -- Denies nausea or vomiting   Objective   Vital signs in last 24 hours: Temp:  [97.6 F (36.4 C)-98.3 F (36.8 C)] 97.6 F (36.4 C) (09/03 1658) Pulse Rate:  [64-73] 64 (09/03 1658) Resp:  [18] 18 (09/03 1658) BP: (145-172)/(57-78) 172/60 (09/03 1658) SpO2:  [98 %-100 %] 98 % (09/03 1658) Weight:  [61.7 kg] 61.7 kg (09/03 0600) Last BM Date : 12/28/23 General:    Chronically ill-appearing gentleman resting quietly in bed Heart:  Regular rate and rhythm; no murmurs Lungs: Respirations even and unlabored, lungs CTA bilaterally Abdomen:  Soft, mildly distended, minimal tenderness to palpation normal bowel sounds. Extremities: Trace bilateral lower extremity edema Neurologic:  Alert and oriented,  grossly normal neurologically. Psych:  Cooperative. Normal mood and affect.  Intake/Output from previous day: 09/02 0701 - 09/03 0700 In: 490 [P.O.:490] Out: 2500  Intake/Output this shift: Total I/O In: 100 [P.O.:100] Out: -   Lab Results: Recent Labs    12/27/23 0508 12/29/23 0423  WBC 4.2 3.7*  HGB 9.9* 9.0*  HCT 31.1* 27.7*  PLT  115* 104*   BMET Recent Labs    12/27/23 0508 12/29/23 0423  NA 129* 128*  K 5.3* 4.5  CL 92* 92*  CO2 27 28  GLUCOSE 90 91  BUN 49* 40*  CREATININE 5.60* 5.00*  CALCIUM  7.8* 7.1*   LFT Recent Labs    12/29/23 0423  PROT 5.9*  ALBUMIN  1.6*  AST 31  ALT <5  ALKPHOS 75  BILITOT 0.5   PT/INR No results for input(s): LABPROT, INR in the last 72 hours.  Studies/Results: CTAP 12/14/2023 1. Moderate to large volume ascites, similar in overall volume to the previous CT. There is new diffuse smooth peritoneal thickening without apparent nodularity, suspicious for peritonitis. Consider diagnostic paracentesis. 2. Mild small bowel and colonic wall thickening, nonspecific in the setting of ascites. No evidence of bowel obstruction or perforation. The appendix is not discretely visualized, although there are no specific signs of appendicitis. 3. Progressive renal cortical thinning and atrophy bilaterally. No hydronephrosis or urinary tract calculus. 4. Chronic femoral head osteonecrosis bilaterally without subchondral collapse or significant secondary degenerative changes. 5.  Aortic Atherosclerosis (ICD10-I70.0).  CTAP 12/15/2023 1. Moderate volume ascites. Increasing peritoneal thickening and enhancement about ascites particularly in the right abdomen and pelvis. Findings are suspicious for peritonitis. Generalized edema and soft tissue thickening of the omentum, nonspecific in the setting of ascites. 2. Ascites with enhancement extends into a right inguinal hernia and into the scrotum. 3. Areas of colonic and small bowel wall thickening, nonspecific in the setting of ascites. 4. Wall thickening of the distal esophagus, can be seen with reflux or esophagitis. 5. Cardiomegaly with coronary artery calcifications. 6. Dilated main pulmonary artery, can  be seen with pulmonary arterial hypertension. 7. Trace left pleural effusion. 8. Question of subtle capsular  nodularity of the liver, can be seen with cirrhosis. Mild splenomegaly and tortuous splenic vein likely portal hypertension. Endoscopic Studies: None   Clinical Impression   53 y.o. male with history of end-stage renal disease on hemodialysis, lupus, congestive heart failure with reduced EF/35 to 40%, chronic ascites without definitive cirrhosis felt to be secondary to renal failure and hypoalbuminemia admitted with generalized acute abdominal pain and rapid accumulation of ascites with cultures positive for Klebsiella consistent with acute bacterial peritonitis.  Treated with cephalosporin.  Repeat paracentesis 12/27/23 showed significantly elevated cell count 57,750, 93% neutrophils; fluid with a milky consistency.  Being evaluated by infectious disease for fungal etiologies as well as TB.  Malignancy remains a consideration.  CT imaging 12/25/2023 shows question of subcapsular nodularity of the liver that can potentially be seen in the setting of cirrhosis, mild splenomegaly and tortuous splenic vein potentially related to portal hypertension which appear to be new findings.  These findings were incongruent with CT scan from 12/14/2023 which did not show any liver abnormalities. Previous SAAG was low at 0.8 suggesting absence of portal hypertension.  Possible previous history of alcohol consumption.  Fib 4 elevated.  Findings on CTAP 12/25/2023 raise the specter of possible liver disease although he has no history of this and it would be unusual to develop changes within 2 weeks.  I have recommended additional workup with liver ultrasound and Doppler flows.   Plan  Obtain liver ultrasound with Doppler flows Follow-up cultures from most recent paracentesis 12/27/2023 Continue cefazolin  Will consider elastography in outpatient setting. Pending remainder of workup will consider laboratory evaluation for chronic hepatitides if there is additional evidence for liver disease.    LOS: 15 days   [name]  12/29/2023, 5:47 PM  [name], MD [facility]

## 2023-12-29 NOTE — Progress Notes (Signed)
 PROGRESS NOTE  [name] FMW:[id number] DOB: 20-Apr-1971   PCP: Berneta Elsie Sayre, MD  Patient is from: Home.  DOA: 12/14/2023 LOS: 15  Chief complaints Chief Complaint  Patient presents with   Abdominal Pain     Brief Narrative / Interim history: 53yo with h/o ESRD on TTS HD, SLE, ascites without diagnosis of cirrhosis, and chronic HFrEF who presented on 8/19 with abdominal pain. Last paracentesis was in 09/2023.  Patient underwent paracentesis with removal of 1.8 L on 8/19.  Fluid analysis consistent with SBP.  Culture positive for Klebsiella.  Treated with Ceftriaxone  -> Cefazolin  x 14 days. Underwent repeat para on 8/26, 8/29 and 9/1 with removal of 700 cc, 2.2 L and 920 cc fluids respectively.  Despite antibiotics, subsequent fluid analysis with worsening neutrophil counts.  Recurrent ascites felt to be due to hypoalbuminemia.  Further fluid studies including AFS, AFC, MT-RIF if NAA, fungal serologies... Pending.  Cryptococcal antigen negative.  GI following.  ID seems to have signed off.  Subjective: Seen and examined earlier this morning.  No major events overnight or this morning.  No specific complaints other than feeling tired and sleepy.  Denies nausea, vomiting, abdominal pain, chest pain, shortness of breath or UTI symptoms.  Patient's sister at bedside  Objective: Vitals:   12/28/23 2053 12/29/23 0454 12/29/23 0600 12/29/23 0733  BP: (!) 169/78 (!) 162/65  (!) 145/57  Pulse: 73 65  64  Resp: 18 18  18   Temp: 98.1 F (36.7 C) 98.3 F (36.8 C)  98.3 F (36.8 C)  TempSrc:    Oral  SpO2: 100% 100%  99%  Weight:   61.7 kg   Height:        Examination:  GENERAL: No apparent distress.  Appears frail. HEENT: MMM.  Vision and hearing grossly intact.  NECK: Supple.  No apparent JVD.  RESP:  No IWOB.  Fair aeration bilaterally. CVS:  RRR. Heart sounds normal.  ABD/GI/GU: BS+.  Abdomen distended.  Nontender.  No rebound. MSK/EXT:  Moves extremities. No  apparent deformity. No edema.  SKIN: no apparent skin lesion or wound NEURO: AA.  Oriented appropriately.  No apparent focal neuro deficit. PSYCH: Calm. Normal affect.   Consultants:  Gastroenterology Interventional radiology Infectious disease Nephrology  Procedures: 8/19, 8/26, 8/29 and 9/1-paracentesis  Microbiology summarized: 8/19-peritoneal fluid with Klebsiella pneumoniae 8/19-blood cultures NGTD 8/29-peritoneal fluid culture NGTD 9/1-peritoneal fluid culture NGTD  Assessment and plan: Spontaneous bacterial peritonitis-initial peritoneal fluid culture on 8/91 with Klebsiella pneumonia.  Patient treated with ceftriaxone  and antibiotic escalated to IV Ancef .  Subsequent fluid analysis with worsening neutrophilic counts despite negative cultures.  Cryptococcal antigen negative.  Concern about autoimmune response from underlying lupus contributing. -Follow speciality fluid tests including AFS, AFC, MT-RIF if NAA, fungal serologies. -Follow peritoneal fluid chlyomicrons and triglycerides  -GI recommends abdominal Doppler to assess for portal hypertension   Recurrent ascites without diagnosis of liver cirrhosis -S/p multiple paracentesis as above. -Abdominal Doppler as above     Systemic lupus erythematosus with lung involvement -Per nephrology, lupus flare are less, in HD patient but still possible. -On Plaquenil .  Prednisone  decreased from 20 to 15 mg by nephrology.   Acute metabolic encephalopathy: appeared very anxious, delirious, paranoid, even hallucinating on 8/22 Per sister - patient does drink to cover pain from his lupus, has reduced his alcohol consumption, reports drinking 1.5 pints of alcohol at a time periodically but denies daily drinking.  Resolved  Abdominal pain: Due to the above.  Fairly  controlled. -Continue Tylenol  and oxycodone  as needed.  Chronic pancytopenia: Due to SLE, immunosuppression, renal disease -SCDs for DVT prophylaxis    ESRD on HD  TTS -Per nephrology   Chronic HFpEF: Appears euvolemic on exam except for ascites. - Volume management by dialysis  Hypertension: BP slightly elevated. -Continue carvedilol  -Ultrafiltration per nephrology   Hypothyroidism: TSH 7.9. -Started on Synthroid  50 micrograms daily -Follow up TFTs in 4-6 weeks  Hyponatremia: Likely due to ESRD -Management per nephrology.      GERD -Continue Protonix    Increased nutrient needs Body mass index is 18.97 kg/m. Nutrition Problem: Increased nutrient needs Etiology: chronic illness Signs/Symptoms: estimated needs Interventions: Refer to RD note for recommendations, Nepro shake, MVI   DVT prophylaxis:  SCDs Start: 12/14/23 1804  Code Status: Full code Family Communication: Updated patient's sister at bedside Level of care: Med-Surg Status is: Inpatient Remains inpatient appropriate because: Spontaneous peritonitis, recurrent ascites   Final disposition: Home with home health once medically stable   55 minutes with more than 50% spent in reviewing records, counseling patient/family and coordinating care.   Sch Meds:  Scheduled Meds:  carvedilol   25 mg Oral BID   Chlorhexidine  Gluconate Cloth  6 each Topical Q0600   docusate sodium   100 mg Oral BID   doxercalciferol   2 mcg Intravenous Q T,Th,Sa-HD   feeding supplement (NEPRO CARB STEADY)  237 mL Oral Q24H   folic acid   1 mg Oral Daily   hydroxychloroquine   200 mg Oral BID   levothyroxine   50 mcg Oral QAC breakfast   multivitamin  1 tablet Oral QHS   pantoprazole   40 mg Oral Daily   polyethylene glycol  17 g Oral Daily   predniSONE   15 mg Oral Q breakfast   thiamine   100 mg Oral Daily   Continuous Infusions:   ceFAZolin  (ANCEF ) IV 1 g (12/29/23 1314)   [START ON 12/30/2023]  ceFAZolin  (ANCEF ) IV     PRN Meds:.acetaminophen  **OR** [DISCONTINUED] acetaminophen , albuterol , bisacodyl , bisacodyl , melatonin, menthol -cetylpyridinium, ondansetron  **OR** ondansetron  (ZOFRAN ) IV,  mouth rinse, oxyCODONE   Antimicrobials: Anti-infectives (From admission, onward)    Start     Dose/Rate Route Frequency Ordered Stop   12/30/23 1200  ceFAZolin  (ANCEF ) IVPB 2g/100 mL premix        2 g 200 mL/hr over 30 Minutes Intravenous Every T-Th-Sa (Hemodialysis) 12/28/23 1518 01/11/24 1159   12/30/23 0000  ceFAZolin  (ANCEF ) IVPB        2 g Intravenous Every T-Th-Sa (Hemodialysis) 12/28/23 1522 01/08/24 2359   12/29/23 1200  ceFAZolin  (ANCEF ) IVPB 1 g/50 mL premix        1 g 100 mL/hr over 30 Minutes Intravenous Every 24 hours 12/28/23 1518 12/30/23 1159   12/22/23 1400  hydroxychloroquine  (PLAQUENIL ) tablet 200 mg        200 mg Oral 2 times daily 12/22/23 1252     12/22/23 1200  ceFAZolin  (ANCEF ) IVPB 1 g/50 mL premix  Status:  Discontinued        1 g 100 mL/hr over 30 Minutes Intravenous Every 24 hours 12/21/23 1039 12/28/23 1518   12/15/23 0800  cefTRIAXone  (ROCEPHIN ) 2 g in sodium chloride  0.9 % 100 mL IVPB  Status:  Discontinued        2 g 200 mL/hr over 30 Minutes Intravenous Every 24 hours 12/14/23 1740 12/21/23 1039   12/14/23 1745  metroNIDAZOLE  (FLAGYL ) IVPB 500 mg  Status:  Discontinued        500 mg 100 mL/hr over 60 Minutes  Intravenous Every 12 hours 12/14/23 1740 12/17/23 1138   12/14/23 1545  cefTRIAXone  (ROCEPHIN ) 2 g in sodium chloride  0.9 % 100 mL IVPB        2 g 200 mL/hr over 30 Minutes Intravenous  Once 12/14/23 1534 12/14/23 1649        I have personally reviewed the following labs and images: CBC: Recent Labs  Lab 12/23/23 0605 12/24/23 0414 12/25/23 0853 12/26/23 0727 12/27/23 0508 12/29/23 0423  WBC 8.5 6.5 5.8 5.0 4.2 3.7*  NEUTROABS 6.4 5.3  --  4.6 3.2 2.9  HGB 10.9* 10.4* 9.2* 9.6* 9.9* 9.0*  HCT 33.9* 32.6* 29.1* 30.7* 31.1* 27.7*  MCV 86.9 87.6 87.4 88.7 86.4 86.3  PLT 92* 88* 100* 103* 115* 104*   BMP &GFR Recent Labs  Lab 12/24/23 0414 12/25/23 0854 12/26/23 0727 12/27/23 0508 12/29/23 0423  NA 130* 128* 129* 129* 128*  K  4.8 5.2* 4.7 5.3* 4.5  CL 92* 92* 94* 92* 92*  CO2 28 26 25 27 28   GLUCOSE 149* 120* 83 90 91  BUN 30* 53* 37* 49* 40*  CREATININE 4.60* 6.04* 4.45* 5.60* 5.00*  CALCIUM  7.8* 7.7* 7.5* 7.8* 7.1*  PHOS  --  6.2*  --   --   --    Estimated Creatinine Clearance: 14.7 mL/min (A) (by C-G formula based on SCr of 5 mg/dL (H)). Liver & Pancreas: Recent Labs  Lab 12/23/23 0605 12/24/23 0414 12/25/23 0854 12/26/23 0727 12/27/23 0508 12/29/23 0423  AST 29 28  --  36 32 31  ALT 11 10  --  6 6 <5  ALKPHOS 75 89  --  92 83 75  BILITOT 0.7 0.6  --  0.4 0.7 0.5  PROT 6.3* 6.6  --  6.2* 6.3* 5.9*  ALBUMIN  <1.5* 1.6* 1.5* 1.5* 1.6* 1.6*   No results for input(s): LIPASE, AMYLASE in the last 168 hours. No results for input(s): AMMONIA in the last 168 hours. Diabetic: No results for input(s): HGBA1C in the last 72 hours. No results for input(s): GLUCAP in the last 168 hours. Cardiac Enzymes: No results for input(s): CKTOTAL, CKMB, CKMBINDEX, TROPONINI in the last 168 hours. No results for input(s): PROBNP in the last 8760 hours. Coagulation Profile: Recent Labs  Lab 12/23/23 0605  INR 1.2   Thyroid  Function Tests: No results for input(s): TSH, T4TOTAL, FREET4, T3FREE, THYROIDAB in the last 72 hours. Lipid Profile: No results for input(s): CHOL, HDL, LDLCALC, TRIG, CHOLHDL, LDLDIRECT in the last 72 hours. Anemia Panel: No results for input(s): VITAMINB12, FOLATE, FERRITIN, TIBC, IRON, RETICCTPCT in the last 72 hours. Urine analysis:    Component Value Date/Time   COLORURINE AMBER (A) 06/29/2021 0425   APPEARANCEUR HAZY (A) 06/29/2021 0425   LABSPEC 1.018 06/29/2021 0425   PHURINE 7.0 06/29/2021 0425   GLUCOSEU NEGATIVE 06/29/2021 0425   HGBUR SMALL (A) 06/29/2021 0425   BILIRUBINUR NEGATIVE 06/29/2021 0425   KETONESUR 5 (A) 06/29/2021 0425   PROTEINUR >=300 (A) 06/29/2021 0425   NITRITE NEGATIVE 06/29/2021 0425   LEUKOCYTESUR  NEGATIVE 06/29/2021 0425   Sepsis Labs: Invalid input(s): PROCALCITONIN, LACTICIDVEN  Microbiology: Recent Results (from the past 240 hours)  Body fluid culture w Gram Stain     Status: None   Collection Time: 12/24/23 12:35 PM   Specimen: Abdomen; Peritoneal Fluid  Result Value Ref Range Status   Specimen Description PERITONEAL  Final   Special Requests NONE  Final   Gram Stain   Final  RARE WBC PRESENT,BOTH PMN AND MONONUCLEAR NO ORGANISMS SEEN    Culture   Final    NO GROWTH 3 DAYS Performed at [facility], [address] [place], [place] [address]    Report Status 12/27/2023 FINAL  Final  Body fluid culture w Gram Stain     Status: None (Preliminary result)   Collection Time: 12/27/23 10:49 AM   Specimen: Peritoneal Washings; Body Fluid  Result Value Ref Range Status   Specimen Description PERITONEAL  Final   Special Requests NONE  Final   Gram Stain   Final    MODERATE WBC PRESENT, PREDOMINANTLY PMN NO ORGANISMS SEEN    Culture   Final    NO GROWTH 2 DAYS Performed at [facility], [address], [place], [place] [address]    Report Status PENDING  Incomplete  Culture, fungus without smear     Status: None (Preliminary result)   Collection Time: 12/27/23 10:49 AM   Specimen: Peritoneal Washings; Other  Result Value Ref Range Status   Specimen Description PERITONEAL  Final   Special Requests NONE  Final   Culture   Final    NO FUNGUS ISOLATED AFTER 1 DAY Performed at [facility], [address] [place], [place] [address]    Report Status PENDING  Incomplete    Radiology Studies: No results found.    Mliss Wedin T. Wylee Ogden [facility]  If 7PM-7AM, please contact night-coverage [email or web address] 12/29/2023, 1:21 PM

## 2023-12-30 DIAGNOSIS — E039 Hypothyroidism, unspecified: Secondary | ICD-10-CM | POA: Diagnosis not present

## 2023-12-30 DIAGNOSIS — K652 Spontaneous bacterial peritonitis: Secondary | ICD-10-CM | POA: Diagnosis not present

## 2023-12-30 DIAGNOSIS — I5022 Chronic systolic (congestive) heart failure: Secondary | ICD-10-CM | POA: Diagnosis not present

## 2023-12-30 DIAGNOSIS — N186 End stage renal disease: Secondary | ICD-10-CM | POA: Diagnosis not present

## 2023-12-30 LAB — CBC
HCT: 29.8 % — ABNORMAL LOW (ref 39.0–52.0)
Hemoglobin: 9.8 g/dL — ABNORMAL LOW (ref 13.0–17.0)
MCH: 28.4 pg (ref 26.0–34.0)
MCHC: 32.9 g/dL (ref 30.0–36.0)
MCV: 86.4 fL (ref 80.0–100.0)
Platelets: 126 K/uL — ABNORMAL LOW (ref 150–400)
RBC: 3.45 MIL/uL — ABNORMAL LOW (ref 4.22–5.81)
RDW: 16.7 % — ABNORMAL HIGH (ref 11.5–15.5)
WBC: 7.3 K/uL (ref 4.0–10.5)
nRBC: 0 % (ref 0.0–0.2)

## 2023-12-30 LAB — MAGNESIUM: Magnesium: 1.8 mg/dL (ref 1.7–2.4)

## 2023-12-30 LAB — BODY FLUID CULTURE W GRAM STAIN: Culture: NO GROWTH

## 2023-12-30 LAB — COMPREHENSIVE METABOLIC PANEL WITH GFR
ALT: <5 U/L (ref 0–44)
AST: 33 U/L (ref 15–41)
Albumin: 1.8 g/dL — ABNORMAL LOW (ref 3.5–5.0)
Alkaline Phosphatase: 78 U/L (ref 38–126)
Anion gap: 10 (ref 5–15)
BUN: 27 mg/dL — ABNORMAL HIGH (ref 6–20)
CO2: 28 mmol/L (ref 22–32)
Calcium: 7.6 mg/dL — ABNORMAL LOW (ref 8.9–10.3)
Chloride: 94 mmol/L — ABNORMAL LOW (ref 98–111)
Creatinine, Ser: 3.68 mg/dL — ABNORMAL HIGH (ref 0.61–1.24)
GFR, Estimated: 19 mL/min — ABNORMAL LOW (ref >60)
Glucose, Bld: 93 mg/dL (ref 70–99)
Potassium: 4.2 mmol/L (ref 3.5–5.1)
Sodium: 132 mmol/L — ABNORMAL LOW (ref 135–145)
Total Bilirubin: 0.8 mg/dL (ref 0.0–1.2)
Total Protein: 6.7 g/dL (ref 6.5–8.1)

## 2023-12-30 LAB — PROTIME-INR
INR: 1.2 (ref 0.8–1.2)
Prothrombin Time: 15.7 s — ABNORMAL HIGH (ref 11.4–15.2)

## 2023-12-30 LAB — CYTOLOGY - NON PAP

## 2023-12-30 MED ORDER — LIDOCAINE-PRILOCAINE 2.5-2.5 % EX CREA: 1.0000 | TOPICAL_CREAM | CUTANEOUS | Status: DC | PRN

## 2023-12-30 MED ORDER — LIDOCAINE HCL (PF) 1 % IJ SOLN: 5.0000 mL | INTRAMUSCULAR | Status: DC | PRN

## 2023-12-30 MED ORDER — PENTAFLUOROPROP-TETRAFLUOROETH EX AERO: 1.0000 | INHALATION_SPRAY | CUTANEOUS | Status: DC | PRN

## 2023-12-30 MED ORDER — HEPARIN SODIUM (PORCINE) 1000 UNIT/ML DIALYSIS: 1000.0000 [IU] | INTRAMUSCULAR | Status: DC | PRN

## 2023-12-30 MED ORDER — SODIUM CHLORIDE 0.9 % IV SOLN
12.5000 mg | Freq: Once | INTRAVENOUS | Status: AC
  Administered 2023-12-30: 12.5 mg via INTRAVENOUS
  Filled 2023-12-30: qty 12.5

## 2023-12-30 MED ORDER — ONDANSETRON HCL 4 MG/2ML IJ SOLN
INTRAMUSCULAR | Status: AC
  Filled 2023-12-30: qty 2

## 2023-12-30 MED ORDER — CEFAZOLIN SODIUM-DEXTROSE 2-4 GM/100ML-% IV SOLN
INTRAVENOUS | Status: AC
  Filled 2023-12-30: qty 100

## 2023-12-30 MED ORDER — ALTEPLASE 2 MG IJ SOLR: 2.0000 mg | Freq: Once | INTRAMUSCULAR | Status: DC | PRN

## 2023-12-30 MED ORDER — ANTICOAGULANT SODIUM CITRATE 4% (200MG/5ML) IV SOLN
5.0000 mL | Status: DC | PRN
  Filled 2023-12-30: qty 5

## 2023-12-30 NOTE — Progress Notes (Signed)
 PROGRESS NOTE  [name] FMW:[id number] DOB: 1970/09/19   PCP: Berneta Elsie Sayre, MD  Patient is from: Home.  DOA: 12/14/2023 LOS: 16  Chief complaints Chief Complaint  Patient presents with   Abdominal Pain     Brief Narrative / Interim history: 53yo with h/o ESRD on TTS HD, SLE, ascites without diagnosis of cirrhosis, and chronic HFrEF who presented on 8/19 with abdominal pain. Last paracentesis was in 09/2023.  Patient underwent paracentesis with removal of 1.8 L on 8/19.  Fluid analysis consistent with SBP.  Culture positive for Klebsiella.  Treated with Ceftriaxone  -> Cefazolin  x 14 days. Underwent repeat para on 8/26, 8/29 and 9/1 with removal of 700 cc, 2.2 L and 920 cc fluids respectively.  Despite antibiotics, subsequent fluid analysis with worsening neutrophil counts.  Recurrent ascites felt to be due to hypoalbuminemia.  Further fluid studies including AFS, AFC, MT-RIF if NAA, fungal serologies... Pending.  Cryptococcal antigen negative.  GI following.  ID seems to have signed off.  Subjective: Seen and examined earlier this afternoon after her return from dialysis.  Reports nausea and vomiting since he had abdominal ultrasound Thursday.  No abdominal pain but tender.  Reports having bowel movements.  Objective: Vitals:   12/30/23 1230 12/30/23 1300 12/30/23 1330 12/30/23 1450  BP:   (!) 119/53 (!) 134/52  Pulse: 66 67 68 79  Resp: 13 14 13 16   Temp:   98.1 F (36.7 C) (!) 97.5 F (36.4 C)  TempSrc:   Oral Oral  SpO2:    97%  Weight:      Height:        Examination:  GENERAL: No apparent distress.  Appears frail. HEENT: MMM.  Vision and hearing grossly intact.  NECK: Supple.  No apparent JVD.  RESP:  No IWOB.  Fair aeration bilaterally. CVS:  RRR. Heart sounds normal.  ABD/GI/GU: BS+.  Patient did not allow me to palpate abdomen. MSK/EXT:  Moves extremities. No apparent deformity. No edema.  SKIN: no apparent skin lesion or wound NEURO: AA.   Oriented appropriately.  No apparent focal neuro deficit. PSYCH: Calm. Normal affect.   Consultants:  Gastroenterology Interventional radiology Infectious disease Nephrology  Procedures: 8/19, 8/26, 8/29 and 9/1-paracentesis  Microbiology summarized: 8/19-peritoneal fluid with Klebsiella pneumoniae 8/19-blood cultures NGTD 8/29-peritoneal fluid culture NGTD 9/1-peritoneal fluid culture NGTD  Assessment and plan: Spontaneous bacterial peritonitis-initial peritoneal fluid culture on 8/91 with Klebsiella pneumonia.  Patient treated with ceftriaxone  and antibiotic escalated to IV Ancef .  Subsequent fluid analysis with worsening neutrophilic counts despite negative cultures.  Cryptococcal antigen, QuantiFERON gold and fungal culture negative.  Concern about autoimmune response from underlying lupus contributing.  Fungal culture negative.  QuantiFERON gold negative.  Liver Doppler with patent portal vein and normal directional flow, cirrhotic liver morphology, splenomegaly and complex free fluid throughout the abdomen.  Still with significant tenderness. -Continue IV Ancef  until 01/11/2024 -Follow speciality fluid tests including AFS, AFC, MT-RIF if NAA, fungal serologies. -Will obtain repeat CT abdomen and pelvis with contrast if no improvement in GI symptoms.   Recurrent ascites without diagnosis of liver cirrhosis-liver Doppler raises concern for cirrhosis morphology and splenomegaly. -S/p multiple paracentesis as above. -Abdominal Doppler as above    Systemic lupus erythematosus with lung involvement -Per nephrology, lupus flare are less, in HD patient but still possible. -On Plaquenil .  Prednisone  decreased from 20 to 15 mg by nephrology.   Acute metabolic encephalopathy: appeared very anxious, delirious, paranoid, even hallucinating on 8/22 Per sister - patient  does drink to cover pain from his lupus, has reduced his alcohol consumption, reports drinking 1.5 pints of alcohol at a time  periodically but denies daily drinking.  Resolved  Nausea, vomiting abdominal pain: Due to the above.   -Continue Tylenol  and oxycodone  as needed. - Antiemetics as needed - May need repeat CT abdomen and pelvis if nausea and vomiting persists  Chronic pancytopenia: Due to SLE, immunosuppression, renal disease -SCDs for DVT prophylaxis    ESRD on HD TTS -Per nephrology   Chronic HFpEF: Appears euvolemic on exam except for ascites. - Volume management by dialysis  Hypertension: BP slightly elevated. -Continue carvedilol  -Ultrafiltration per nephrology   Hypothyroidism: TSH 7.9. -Started on Synthroid  50 micrograms daily -Follow up TFTs in 4-6 weeks  Hyponatremia: Likely due to ESRD -Management per nephrology.   GERD -Continue Protonix    Increased nutrient needs Body mass index is 19.06 kg/m. Nutrition Problem: Increased nutrient needs Etiology: chronic illness Signs/Symptoms: estimated needs Interventions: Refer to RD note for recommendations, Nepro shake, MVI   DVT prophylaxis:  SCDs Start: 12/14/23 1804  Code Status: Full code Family Communication: None at bedside. Level of care: Med-Surg Status is: Inpatient Remains inpatient appropriate because: Spontaneous peritonitis, recurrent ascites   Final disposition: Home with home health once medically stable   55 minutes with more than 50% spent in reviewing records, counseling patient/family and coordinating care.   Sch Meds:  Scheduled Meds:  carvedilol   25 mg Oral BID   Chlorhexidine  Gluconate Cloth  6 each Topical Q0600   docusate sodium   100 mg Oral BID   doxercalciferol   2 mcg Intravenous Q T,Th,Sa-HD   feeding supplement (NEPRO CARB STEADY)  237 mL Oral Q24H   folic acid   1 mg Oral Daily   hydroxychloroquine   200 mg Oral BID   levothyroxine   50 mcg Oral QAC breakfast   multivitamin  1 tablet Oral QHS   pantoprazole   40 mg Oral Daily   polyethylene glycol  17 g Oral Daily   predniSONE   15 mg Oral Q  breakfast   thiamine   100 mg Oral Daily   Continuous Infusions:   ceFAZolin  (ANCEF ) IV Stopped (12/30/23 1258)   PRN Meds:.acetaminophen  **OR** [DISCONTINUED] acetaminophen , albuterol , alum & mag hydroxide-simeth, bisacodyl , bisacodyl , melatonin, menthol -cetylpyridinium, ondansetron  **OR** ondansetron  (ZOFRAN ) IV, mouth rinse, oxyCODONE   Antimicrobials: Anti-infectives (From admission, onward)    Start     Dose/Rate Route Frequency Ordered Stop   12/30/23 1200  ceFAZolin  (ANCEF ) IVPB 2g/100 mL premix        2 g 200 mL/hr over 30 Minutes Intravenous Every T-Th-Sa (Hemodialysis) 12/28/23 1518 01/11/24 1159   12/30/23 0000  ceFAZolin  (ANCEF ) IVPB        2 g Intravenous Every T-Th-Sa (Hemodialysis) 12/28/23 1522 01/08/24 2359   12/29/23 1200  ceFAZolin  (ANCEF ) IVPB 1 g/50 mL premix        1 g 100 mL/hr over 30 Minutes Intravenous Every 24 hours 12/28/23 1518 12/29/23 1344   12/22/23 1400  hydroxychloroquine  (PLAQUENIL ) tablet 200 mg        200 mg Oral 2 times daily 12/22/23 1252     12/22/23 1200  ceFAZolin  (ANCEF ) IVPB 1 g/50 mL premix  Status:  Discontinued        1 g 100 mL/hr over 30 Minutes Intravenous Every 24 hours 12/21/23 1039 12/28/23 1518   12/15/23 0800  cefTRIAXone  (ROCEPHIN ) 2 g in sodium chloride  0.9 % 100 mL IVPB  Status:  Discontinued  2 g 200 mL/hr over 30 Minutes Intravenous Every 24 hours 12/14/23 1740 12/21/23 1039   12/14/23 1745  metroNIDAZOLE  (FLAGYL ) IVPB 500 mg  Status:  Discontinued        500 mg 100 mL/hr over 60 Minutes Intravenous Every 12 hours 12/14/23 1740 12/17/23 1138   12/14/23 1545  cefTRIAXone  (ROCEPHIN ) 2 g in sodium chloride  0.9 % 100 mL IVPB        2 g 200 mL/hr over 30 Minutes Intravenous  Once 12/14/23 1534 12/14/23 1649        I have personally reviewed the following labs and images: CBC: Recent Labs  Lab 12/24/23 0414 12/25/23 0853 12/26/23 0727 12/27/23 0508 12/29/23 0423 12/30/23 1459  WBC 6.5 5.8 5.0 4.2 3.7* 7.3   NEUTROABS 5.3  --  4.6 3.2 2.9  --   HGB 10.4* 9.2* 9.6* 9.9* 9.0* 9.8*  HCT 32.6* 29.1* 30.7* 31.1* 27.7* 29.8*  MCV 87.6 87.4 88.7 86.4 86.3 86.4  PLT 88* 100* 103* 115* 104* 126*   BMP &GFR Recent Labs  Lab 12/24/23 0414 12/25/23 0854 12/26/23 0727 12/27/23 0508 12/29/23 0423  NA 130* 128* 129* 129* 128*  K 4.8 5.2* 4.7 5.3* 4.5  CL 92* 92* 94* 92* 92*  CO2 28 26 25 27 28   GLUCOSE 149* 120* 83 90 91  BUN 30* 53* 37* 49* 40*  CREATININE 4.60* 6.04* 4.45* 5.60* 5.00*  CALCIUM  7.8* 7.7* 7.5* 7.8* 7.1*  PHOS  --  6.2*  --   --   --    Estimated Creatinine Clearance: 14.8 mL/min (A) (by C-G formula based on SCr of 5 mg/dL (H)). Liver & Pancreas: Recent Labs  Lab 12/24/23 0414 12/25/23 0854 12/26/23 0727 12/27/23 0508 12/29/23 0423  AST 28  --  36 32 31  ALT 10  --  6 6 <5  ALKPHOS 89  --  92 83 75  BILITOT 0.6  --  0.4 0.7 0.5  PROT 6.6  --  6.2* 6.3* 5.9*  ALBUMIN  1.6* 1.5* 1.5* 1.6* 1.6*   No results for input(s): LIPASE, AMYLASE in the last 168 hours. No results for input(s): AMMONIA in the last 168 hours. Diabetic: No results for input(s): HGBA1C in the last 72 hours. No results for input(s): GLUCAP in the last 168 hours. Cardiac Enzymes: No results for input(s): CKTOTAL, CKMB, CKMBINDEX, TROPONINI in the last 168 hours. No results for input(s): PROBNP in the last 8760 hours. Coagulation Profile: No results for input(s): INR, PROTIME in the last 168 hours.  Thyroid  Function Tests: No results for input(s): TSH, T4TOTAL, FREET4, T3FREE, THYROIDAB in the last 72 hours. Lipid Profile: No results for input(s): CHOL, HDL, LDLCALC, TRIG, CHOLHDL, LDLDIRECT in the last 72 hours. Anemia Panel: No results for input(s): VITAMINB12, FOLATE, FERRITIN, TIBC, IRON, RETICCTPCT in the last 72 hours. Urine analysis:    Component Value Date/Time   COLORURINE AMBER (A) 06/29/2021 0425   APPEARANCEUR HAZY (A)  06/29/2021 0425   LABSPEC 1.018 06/29/2021 0425   PHURINE 7.0 06/29/2021 0425   GLUCOSEU NEGATIVE 06/29/2021 0425   HGBUR SMALL (A) 06/29/2021 0425   BILIRUBINUR NEGATIVE 06/29/2021 0425   KETONESUR 5 (A) 06/29/2021 0425   PROTEINUR >=300 (A) 06/29/2021 0425   NITRITE NEGATIVE 06/29/2021 0425   LEUKOCYTESUR NEGATIVE 06/29/2021 0425   Sepsis Labs: Invalid input(s): PROCALCITONIN, LACTICIDVEN  Microbiology: Recent Results (from the past 240 hours)  Body fluid culture w Gram Stain     Status: None   Collection Time: 12/24/23  12:35 PM   Specimen: Abdomen; Peritoneal Fluid  Result Value Ref Range Status   Specimen Description PERITONEAL  Final   Special Requests NONE  Final   Gram Stain   Final    RARE WBC PRESENT,BOTH PMN AND MONONUCLEAR NO ORGANISMS SEEN    Culture   Final    NO GROWTH 3 DAYS Performed at [facility], [address] [place], [place] [address]    Report Status 12/27/2023 FINAL  Final  MT-RIF NAA W Cult, Non-Sputum     Status: None (Preliminary result)   Collection Time: 12/26/23 12:17 PM   Specimen: Pleural Fluid  Result Value Ref Range Status   Source PERITONEAL  Final    Comment: Performed at [facility], [address], [place], [place] [address]   Specimen Source [facility]  Final    Comment: (NOTE) Test not performed. The required specimen for the test ordered was not received. Received: Peritoneal Fluid Requires: BAL, Pleural or CSF Notified Tomadur S. 12/30/2023    M tuberculosis complex WRORD  Final    Comment: (NOTE) Test not performed. The required specimen for the test ordered was not received. Received: Peritoneal Fluid Requires: BAL, Pleural or CSF Notified Tomadur S. 12/30/2023 This test was developed and its performance characteristics determined by [facility]. It has not been cleared or approved by the Food and Drug Administration.    Rifampin [facility]ORD  Final    Comment: (NOTE) Test not performed. The required specimen for  the test ordered was not received. Received: Peritoneal Fluid Requires: BAL, Pleural or CSF Notified Tomadur S. 12/30/2023 This test was developed and its performance characteristics determined by [facility]. It has not been cleared or approved by the Food and Drug Administration.    AFB Specimen Processing Concentration  Final    Comment: (NOTE) Performed At: [facility] [address] [place], [place] [id number] Jennette Shorter MD Ey:[id number]    Acid Fast Culture PENDING  Incomplete  Body fluid culture w Gram Stain     Status: None   Collection Time: 12/27/23 10:49 AM   Specimen: Peritoneal Washings; Body Fluid  Result Value Ref Range Status   Specimen Description PERITONEAL  Final   Special Requests NONE  Final   Gram Stain   Final    MODERATE WBC PRESENT, PREDOMINANTLY PMN NO ORGANISMS SEEN    Culture   Final    NO GROWTH 3 DAYS Performed at [facility], [address] [place], [place] [address]    Report Status 12/30/2023 FINAL  Final  Culture, fungus without smear     Status: None (Preliminary result)   Collection Time: 12/27/23 10:49 AM   Specimen: Peritoneal Washings; Other  Result Value Ref Range Status   Specimen Description PERITONEAL  Final   Special Requests NONE  Final   Culture   Final    NO FUNGUS ISOLATED AFTER 3 DAYS Performed at [facility], [address], [place], [place] [address]    Report Status PENDING  Incomplete    Radiology Studies: US  LIVER DOPPLER Result Date: 12/29/2023 CLINICAL DATA:  Portal hypertension. EXAM: DUPLEX ULTRASOUND OF LIVER TECHNIQUE: Color and duplex Doppler ultrasound was performed to evaluate the hepatic in-flow and out-flow vessels. COMPARISON:  CT abdomen and pelvis 12/25/2023. FINDINGS: Liver: Normal parenchymal echogenicity. There is nodular liver contour. No focal lesion, mass or intrahepatic biliary ductal dilatation. Main Portal Vein size: 1.3 cm cm Portal Vein Velocities-all hepatopetal. Main  Prox:  21.5  cm/sec Main Mid: 26.1 cm/sec Main Dist:  25.7 cm/sec Right: 16.1 cm/sec Left: 16 point cm/sec Hepatic Vein Velocities-all hepatofugal. Right:  57.5 cm/sec Middle:  23.0 cm/sec Left:  33.6 cm/sec IVC: Present and patent with normal respiratory phasicity. Hepatic Artery Velocity:  46.2 cm/sec Splenic Vein Velocity:  26.4 cm/sec Spleen: 15.6 cm x 7.0 cm x 6.8 cm with a total volume of 3 6 cm^3 (411 cm^3 is upper limit normal) Portal Vein Occlusion/Thrombus: No Splenic Vein Occlusion/Thrombus: No Ascites: Complex free fluid is seen throughout the abdomen which contains internal echoes and possible septations. Varices: None IMPRESSION: 1. Patent portal vein with normal direction of flow. 2. Cirrhotic liver morphology. 3. Splenomegaly. 4. Complex free fluid throughout the abdomen. Electronically Signed   By: Greig Pique M.D.   On: 12/29/2023 19:56      Marijo Quizon T. Kaiyden Simkin [facility]  If 7PM-7AM, please contact night-coverage [email or web address] 12/30/2023, 3:17 PM

## 2023-12-30 NOTE — Plan of Care (Signed)
   Problem: Education: Goal: Knowledge of General Education information will improve Description: Including pain rating scale, medication(s)/side effects and non-pharmacologic comfort measures Outcome: Completed/Met

## 2023-12-30 NOTE — Progress Notes (Signed)
 PT Cancellation Note  Patient Details Name: [name] MRN: [id number] DOB: 31-Jul-1970   Cancelled Treatment:    Reason Eval/Treat Not Completed: Patient at procedure or test/unavailable  Currently off unit for HD. Will check back later this afternoon as schedule permits for PT follow-up.  Leontine Roads, PT, DPT [facility]  Rehabilitation Services Physical Therapist Office: [phone] Website: [email or web address]  Leontine GORMAN Roads 12/30/2023, 1:46 PM

## 2023-12-30 NOTE — Progress Notes (Addendum)
 Inpatient Progress Note     Patient Profile/Chief Complaint  53 y.o. male with history of end-stage renal disease on hemodialysis, lupus, congestive heart failure with reduced EF/35 to 40%, chronic ascites without definitive cirrhosis felt to be secondary to renal failure and hypoalbuminemia admitted with generalized acute abdominal pain and rapid accumulation of ascites with cultures positive for Klebsiella consistent with acute bacterial peritonitis.  Treated with cephalosporin.  Repeat paracentesis 12/27/23 showed significantly elevated cell count 57,750, 93% neutrophils; fluid with a milky consistency.  Being evaluated by infectious disease for fungal etiologies as well as TB.    Liver Doppler 12/29/2023 showed cirrhotic liver morphology, splenomegaly, fluid throughout the abdomen with normal Doppler flows   Interval History   -- Abdominal pain had been improving until ultrasound yesterday-states he developed worsening abdominal pain after ultrasound probe was pressed on his abdomen -- Does not feel there has been significant reaccumulation of fluid in his abdomen -- Endorses multiple episodes of emesis today some of which appeared bilious but he is passing gas and does not appear obstructed -- Expresses frustration regarding prolonged hospitalization   Objective   Vital signs in last 24 hours: Temp:  [97.5 F (36.4 C)-98.1 F (36.7 C)] 97.5 F (36.4 C) (09/04 1450) Pulse Rate:  [64-79] 79 (09/04 1450) Resp:  [13-66] 16 (09/04 1450) BP: (86-177)/(39-76) 134/52 (09/04 1450) SpO2:  [97 %-100 %] 97 % (09/04 1450) Weight:  [61.8 kg-62 kg] 62 kg (09/04 0933) Last BM Date : 12/28/23 General:    Chronically ill-appearing gentleman resting quietly in bed Heart:  Regular rate and rhythm; no murmurs Lungs: Respirations even and unlabored, lungs CTA bilaterally Abdomen:  Soft, mildly distended, minimal tenderness to palpation normal bowel sounds. Extremities: Trace bilateral lower  extremity edema Neurologic:  Alert and oriented,  grossly normal neurologically. Psych:  Cooperative. Normal mood and affect.  Intake/Output from previous day: 09/03 0701 - 09/04 0700 In: 100 [P.O.:100] Out: -  Intake/Output this shift: Total I/O In: -  Out: 3000 [Emesis/NG output:500; Other:2500]  Lab Results: Recent Labs    12/29/23 0423 12/30/23 1459  WBC 3.7* 7.3  HGB 9.0* 9.8*  HCT 27.7* 29.8*  PLT 104* 126*   BMET Recent Labs    12/29/23 0423 12/30/23 1459  NA 128* 132*  K 4.5 4.2  CL 92* 94*  CO2 28 28  GLUCOSE 91 93  BUN 40* 27*  CREATININE 5.00* 3.68*  CALCIUM  7.1* 7.6*   LFT Recent Labs    12/30/23 1459  PROT 6.7  ALBUMIN  1.8*  AST 33  ALT <5  ALKPHOS 78  BILITOT 0.8   PT/INR Recent Labs    12/30/23 1459  LABPROT 15.7*  INR 1.2    Studies/Results: CTAP 12/14/2023 1. Moderate to large volume ascites, similar in overall volume to the previous CT. There is new diffuse smooth peritoneal thickening without apparent nodularity, suspicious for peritonitis. Consider diagnostic paracentesis. 2. Mild small bowel and colonic wall thickening, nonspecific in the setting of ascites. No evidence of bowel obstruction or perforation. The appendix is not discretely visualized, although there are no specific signs of appendicitis. 3. Progressive renal cortical thinning and atrophy bilaterally. No hydronephrosis or urinary tract calculus. 4. Chronic femoral head osteonecrosis bilaterally without subchondral collapse or significant secondary degenerative changes. 5.  Aortic Atherosclerosis (ICD10-I70.0).  CTAP 12/15/2023 1. Moderate volume ascites. Increasing peritoneal thickening and enhancement about ascites particularly in the right abdomen and pelvis. Findings are suspicious for peritonitis. Generalized edema and soft tissue  thickening of the omentum, nonspecific in the setting of ascites. 2. Ascites with enhancement extends into a right inguinal  hernia and into the scrotum. 3. Areas of colonic and small bowel wall thickening, nonspecific in the setting of ascites. 4. Wall thickening of the distal esophagus, can be seen with reflux or esophagitis. 5. Cardiomegaly with coronary artery calcifications. 6. Dilated main pulmonary artery, can be seen with pulmonary arterial hypertension. 7. Trace left pleural effusion. 8. Question of subtle capsular nodularity of the liver, can be seen with cirrhosis. Mild splenomegaly and tortuous splenic vein likely portal hypertension.  AUS with Doppler flows 12/29/2023 1. Patent portal vein with normal direction of flow. 2. Cirrhotic liver morphology. 3. Splenomegaly. 4. Complex free fluid throughout the abdomen.  Endoscopic Studies: None   Clinical Impression   53 y.o. male with history of end-stage renal disease on hemodialysis, lupus, congestive heart failure with reduced EF/35 to 40%, chronic ascites without definitive cirrhosis felt to be secondary to renal failure and hypoalbuminemia admitted with generalized acute abdominal pain and rapid accumulation of ascites with cultures positive for Klebsiella consistent with acute bacterial peritonitis.  Treated with cephalosporin.  Repeat paracentesis 12/27/23 showed significantly elevated cell count 57,750, 93% neutrophils; fluid with a milky consistency.  Being evaluated by infectious disease for fungal etiologies as well as TB.  Malignancy remains a consideration.  CT imaging 12/25/2023 shows question of subcapsular nodularity of the liver that can potentially be seen in the setting of cirrhosis, mild splenomegaly and tortuous splenic vein potentially related to portal hypertension which appear to be new findings.  These findings were incongruent with CT scan from 12/14/2023 which did not show any liver abnormalities. Previous SAAG was low at 0.8 suggesting absence of portal hypertension.  Possible previous history of alcohol consumption.  Fib 4  elevated.  Abdominal ultrasound with Doppler flows obtained 12/29/2023 which did confirm cirrhotic liver morphology, splenomegaly but otherwise normal portal flows.  In speaking with Refael today he denies a history of liver disease.  He is vague regarding alcohol consumption.  Viral hepatitides have been ruled out by labs.  Other diagnostic considerations include autoimmune liver disease, hemochromatosis, celiac disease, cardiac cirrhosis given his underlying heart issues.   Plan  Obtain limited serologic evaluation for chronic hepatitides: ASMA, ANA, IgG, TTG IgA, IgA, iron panel, alpha 1 antitrypsin phenotype. Follow-up cultures from most recent paracentesis 12/27/2023 Continue cefazolin  Will consider elastography in outpatient setting. Consider EGD screening for varices when stable in the outpatient setting Continue antiemetics and supportive management for abdominal pain, nausea and vomiting    LOS: 16 days   [name]  12/30/2023, 4:38 PM  [name], MD [facility]

## 2023-12-30 NOTE — Progress Notes (Signed)
 [facility] Progress Note   Subjective:  Seen on HD - was having some nausea and abdominal pain, then vomited, and feeling better now. 3L UFG and tolerating. No dyspnea.   Objective Vitals:   12/30/23 0933 12/30/23 1000 12/30/23 1030 12/30/23 1100  BP: (!) 138/53 (!) 127/57 (!) 112/51 (!) 106/52  Pulse: 66 66 67 67  Resp: (!) 66 13 14 14   Temp: 98.1 F (36.7 C)     TempSrc: Axillary     SpO2: 97% 98% 98% 99%  Weight: 62 kg     Height:       Physical Exam General: Chronically ill appearing, NAD. Room air Heart: RRR; no murmur Lungs: CTA anteriorly Abdomen: distended, mild TTP Extremities: trace BLE edema Dialysis Access: LUE AVF +t/b   Additional Objective Labs: Basic Metabolic Panel: Recent Labs  Lab 12/25/23 0854 12/26/23 0727 12/27/23 0508 12/29/23 0423  NA 128* 129* 129* 128*  K 5.2* 4.7 5.3* 4.5  CL 92* 94* 92* 92*  CO2 26 25 27 28   GLUCOSE 120* 83 90 91  BUN 53* 37* 49* 40*  CREATININE 6.04* 4.45* 5.60* 5.00*  CALCIUM  7.7* 7.5* 7.8* 7.1*  PHOS 6.2*  --   --   --    Liver Function Tests: Recent Labs  Lab 12/26/23 0727 12/27/23 0508 12/29/23 0423  AST 36 32 31  ALT 6 6 <5  ALKPHOS 92 83 75  BILITOT 0.4 0.7 0.5  PROT 6.2* 6.3* 5.9*  ALBUMIN  1.5* 1.6* 1.6*   CBC: Recent Labs  Lab 12/24/23 0414 12/25/23 0853 12/26/23 0727 12/27/23 0508 12/29/23 0423  WBC 6.5 5.8 5.0 4.2 3.7*  NEUTROABS 5.3  --  4.6 3.2 2.9  HGB 10.4* 9.2* 9.6* 9.9* 9.0*  HCT 32.6* 29.1* 30.7* 31.1* 27.7*  MCV 87.6 87.4 88.7 86.4 86.3  PLT 88* 100* 103* 115* 104*   Studies/Results: US  LIVER DOPPLER Result Date: 12/29/2023 CLINICAL DATA:  Portal hypertension. EXAM: DUPLEX ULTRASOUND OF LIVER TECHNIQUE: Color and duplex Doppler ultrasound was performed to evaluate the hepatic in-flow and out-flow vessels. COMPARISON:  CT abdomen and pelvis 12/25/2023. FINDINGS: Liver: Normal parenchymal echogenicity. There is nodular liver contour. No focal lesion, mass or  intrahepatic biliary ductal dilatation. Main Portal Vein size: 1.3 cm cm Portal Vein Velocities-all hepatopetal. Main Prox:  21.5 cm/sec Main Mid: 26.1 cm/sec Main Dist:  25.7 cm/sec Right: 16.1 cm/sec Left: 16 point cm/sec Hepatic Vein Velocities-all hepatofugal. Right:  57.5 cm/sec Middle:  23.0 cm/sec Left:  33.6 cm/sec IVC: Present and patent with normal respiratory phasicity. Hepatic Artery Velocity:  46.2 cm/sec Splenic Vein Velocity:  26.4 cm/sec Spleen: 15.6 cm x 7.0 cm x 6.8 cm with a total volume of 3 6 cm^3 (411 cm^3 is upper limit normal) Portal Vein Occlusion/Thrombus: No Splenic Vein Occlusion/Thrombus: No Ascites: Complex free fluid is seen throughout the abdomen which contains internal echoes and possible septations. Varices: None IMPRESSION: 1. Patent portal vein with normal direction of flow. 2. Cirrhotic liver morphology. 3. Splenomegaly. 4. Complex free fluid throughout the abdomen. Electronically Signed   By: Greig Pique M.D.   On: 12/29/2023 19:56   Medications:   ceFAZolin  (ANCEF ) IV      carvedilol   25 mg Oral BID   Chlorhexidine  Gluconate Cloth  6 each Topical Q0600   docusate sodium   100 mg Oral BID   doxercalciferol   2 mcg Intravenous Q T,Th,Sa-HD   feeding supplement (NEPRO CARB STEADY)  237 mL Oral Q24H   folic acid   1 mg Oral Daily   hydroxychloroquine   200 mg Oral BID   levothyroxine   50 mcg Oral QAC breakfast   multivitamin  1 tablet Oral QHS   pantoprazole   40 mg Oral Daily   polyethylene glycol  17 g Oral Daily   predniSONE   15 mg Oral Q breakfast   thiamine   100 mg Oral Daily    Dialysis Orders TTS - AF 3:45hr, 400/A1.5, EDW 71kg, 2K/2C bath. AVF, no heparin  - Mircera 150mcg IV q 2 weeks (last 8/12) - Hectoral 2mcg IV q HD   Assessment/Plan: SBP: Fluid Cx + Klebsiella, on course of Cefazolin . Symptoms much improved, btu repeat peritoneal cell counts climbing with very high LDH and negative bacterial Cx. ID involved -> fungal Cx negative so far, TB  testing in process, malignancy as well as chylous ascites being considered. Will be finishing 3 week course of Cefazolin . AMS: Infection v. alcohol withdrawal, resolved. ESRD: Continue HD on TTS schedule - HD now, 3L UFG. HTN/volume: BP stable now - ARB and hydralazine d/c'd - remains on BB, UF as tolerated. Anemia of ESRD: Hgb 9 - resume ESA as outpatient. Secondary HPTH: CorrCa/Phos ok - resumed hectoral q HD, not getting binders here Nutrition: Alb very low, continue protein supps. SLE: C3/4 low, dsDNA high - c/w flare - on plaquenil  and prednisone  taper - feels better. Recurrent ascites/cirrhosis: Gets intermittent paracenteses since 2022.   Angel Boehringer, PA-C 12/30/2023, 11:32 AM  [employer]

## 2023-12-31 ENCOUNTER — Inpatient Hospital Stay (HOSPITAL_COMMUNITY)

## 2023-12-31 DIAGNOSIS — M25521 Pain in right elbow: Secondary | ICD-10-CM | POA: Diagnosis not present

## 2023-12-31 DIAGNOSIS — K7460 Unspecified cirrhosis of liver: Secondary | ICD-10-CM

## 2023-12-31 DIAGNOSIS — R188 Other ascites: Secondary | ICD-10-CM | POA: Diagnosis not present

## 2023-12-31 DIAGNOSIS — M7989 Other specified soft tissue disorders: Secondary | ICD-10-CM | POA: Diagnosis not present

## 2023-12-31 DIAGNOSIS — E039 Hypothyroidism, unspecified: Secondary | ICD-10-CM | POA: Diagnosis not present

## 2023-12-31 DIAGNOSIS — I5022 Chronic systolic (congestive) heart failure: Secondary | ICD-10-CM | POA: Diagnosis not present

## 2023-12-31 DIAGNOSIS — K652 Spontaneous bacterial peritonitis: Secondary | ICD-10-CM | POA: Diagnosis not present

## 2023-12-31 DIAGNOSIS — N186 End stage renal disease: Secondary | ICD-10-CM | POA: Diagnosis not present

## 2023-12-31 LAB — RENAL FUNCTION PANEL
Albumin: 1.7 g/dL — ABNORMAL LOW (ref 3.5–5.0)
Anion gap: 17 — ABNORMAL HIGH (ref 5–15)
BUN: 43 mg/dL — ABNORMAL HIGH (ref 6–20)
CO2: 21 mmol/L — ABNORMAL LOW (ref 22–32)
Calcium: 7.7 mg/dL — ABNORMAL LOW (ref 8.9–10.3)
Chloride: 93 mmol/L — ABNORMAL LOW (ref 98–111)
Creatinine, Ser: 5.22 mg/dL — ABNORMAL HIGH (ref 0.61–1.24)
GFR, Estimated: 12 mL/min — ABNORMAL LOW (ref >60)
Glucose, Bld: 69 mg/dL — ABNORMAL LOW (ref 70–99)
Phosphorus: 6.3 mg/dL — ABNORMAL HIGH (ref 2.5–4.6)
Potassium: 5 mmol/L (ref 3.5–5.1)
Sodium: 131 mmol/L — ABNORMAL LOW (ref 135–145)

## 2023-12-31 LAB — CBC
HCT: 27.7 % — ABNORMAL LOW (ref 39.0–52.0)
Hemoglobin: 9.1 g/dL — ABNORMAL LOW (ref 13.0–17.0)
MCH: 28.6 pg (ref 26.0–34.0)
MCHC: 32.9 g/dL (ref 30.0–36.0)
MCV: 87.1 fL (ref 80.0–100.0)
Platelets: 124 K/uL — ABNORMAL LOW (ref 150–400)
RBC: 3.18 MIL/uL — ABNORMAL LOW (ref 4.22–5.81)
RDW: 16.7 % — ABNORMAL HIGH (ref 11.5–15.5)
WBC: 8.4 K/uL (ref 4.0–10.5)
nRBC: 0 % (ref 0.0–0.2)

## 2023-12-31 LAB — IRON AND TIBC
Iron: 26 ug/dL — ABNORMAL LOW (ref 45–182)
Saturation Ratios: 21 % (ref 17.9–39.5)
TIBC: 123 ug/dL — ABNORMAL LOW (ref 250–450)
UIBC: 97 ug/dL

## 2023-12-31 LAB — FUNGITELL BETA-D-GLUCAN

## 2023-12-31 LAB — BLASTOMYCES ANTIGEN
Blastomyces Antigen: NOT DETECTED ng/mL
Interpretation: NEGATIVE

## 2023-12-31 LAB — FERRITIN: Ferritin: 1632 ng/mL — ABNORMAL HIGH (ref 24–336)

## 2023-12-31 LAB — ACID FAST SMEAR (AFB, MYCOBACTERIA): Acid Fast Smear: NEGATIVE

## 2023-12-31 LAB — HISTOPLASMA ANTIBODIES, QN,DID: Histoplasma Ab, Immunodiffusion: NEGATIVE

## 2023-12-31 LAB — MAGNESIUM: Magnesium: 2 mg/dL (ref 1.7–2.4)

## 2023-12-31 MED ORDER — LEVOTHYROXINE SODIUM 25 MCG PO TABS: 25.0000 ug | ORAL_TABLET | Freq: Every day | ORAL | Status: DC

## 2023-12-31 MED ORDER — DEXTROSE 5 % IV SOLN: INTRAVENOUS | Status: AC

## 2023-12-31 MED ORDER — CHLORHEXIDINE GLUCONATE CLOTH 2 % EX PADS
6.0000 | MEDICATED_PAD | Freq: Every day | CUTANEOUS | Status: DC
Start: 2024-01-01 — End: 2024-01-03

## 2023-12-31 NOTE — Progress Notes (Signed)
 [facility] Progress Note   Subjective:    Seen and examined patient at bedside. Currently c/o ABD pain. Noted his BP dropped on HD yesterday and IVFs were given. HD machine also clotted off. 2.5L was removed. Next HD 9/6.   Objective Vitals:   12/30/23 2011 12/31/23 0508 12/31/23 0712 12/31/23 0746  BP: (!) 161/66 (!) 166/66  (!) 168/91  Pulse: 85 80  97  Resp: 17 20  18   Temp: 98.3 F (36.8 C) 98.9 F (37.2 C)  98 F (36.7 C)  TempSrc: Oral Oral  Oral  SpO2: 97% 99%  95%  Weight:   62.3 kg   Height:       Physical Exam General: Chronically ill appearing, NAD. Room air Heart: RRR; no murmur Lungs: CTA anteriorly Abdomen: distended, mild TTP Extremities: trace BLE edema Dialysis Access: LUE AVF +t/b   Filed Weights   12/30/23 0425 12/30/23 0933 12/31/23 9287  Weight: 61.8 kg 62 kg 62.3 kg    Intake/Output Summary (Last 24 hours) at 12/31/2023 1237 Last data filed at 12/31/2023 0930 Gross per 24 hour  Intake 176.94 ml  Output 3300 ml  Net -3123.06 ml    Additional Objective Labs: Basic Metabolic Panel: Recent Labs  Lab 12/25/23 0854 12/26/23 0727 12/29/23 0423 12/30/23 1459 12/31/23 0947  NA 128*   < > 128* 132* 131*  K 5.2*   < > 4.5 4.2 5.0  CL 92*   < > 92* 94* 93*  CO2 26   < > 28 28 21*  GLUCOSE 120*   < > 91 93 69*  BUN 53*   < > 40* 27* 43*  CREATININE 6.04*   < > 5.00* 3.68* 5.22*  CALCIUM  7.7*   < > 7.1* 7.6* 7.7*  PHOS 6.2*  --   --   --  6.3*   < > = values in this interval not displayed.   Liver Function Tests: Recent Labs  Lab 12/27/23 0508 12/29/23 0423 12/30/23 1459 12/31/23 0947  AST 32 31 33  --   ALT 6 <5 <5  --   ALKPHOS 83 75 78  --   BILITOT 0.7 0.5 0.8  --   PROT 6.3* 5.9* 6.7  --   ALBUMIN  1.6* 1.6* 1.8* 1.7*   No results for input(s): LIPASE, AMYLASE in the last 168 hours. CBC: Recent Labs  Lab 12/26/23 0727 12/27/23 0508 12/29/23 0423 12/30/23 1459 12/31/23 0947  WBC 5.0 4.2 3.7* 7.3 8.4   NEUTROABS 4.6 3.2 2.9  --   --   HGB 9.6* 9.9* 9.0* 9.8* 9.1*  HCT 30.7* 31.1* 27.7* 29.8* 27.7*  MCV 88.7 86.4 86.3 86.4 87.1  PLT 103* 115* 104* 126* 124*   Blood Culture    Component Value Date/Time   SDES PERITONEAL 12/27/2023 1049   SDES PERITONEAL 12/27/2023 1049   SPECREQUEST NONE 12/27/2023 1049   SPECREQUEST NONE 12/27/2023 1049   CULT  12/27/2023 1049    NO GROWTH 3 DAYS Performed at [facility], [address] [place], [place] [address]    CULT  12/27/2023 1049    NO FUNGUS ISOLATED AFTER 3 DAYS Performed at [facility], [address] [place], [place] [address]    REPTSTATUS 12/30/2023 FINAL 12/27/2023 1049   REPTSTATUS PENDING 12/27/2023 1049    Cardiac Enzymes: No results for input(s): CKTOTAL, CKMB, CKMBINDEX, TROPONINI in the last 168 hours. CBG: No results for input(s): GLUCAP in the last 168 hours. Iron  Studies:  Recent Labs    12/31/23 0947  IRON 26*  TIBC 123*  FERRITIN 1,632*   Lab Results  Component Value Date   INR 1.2 12/30/2023   INR 1.2 12/23/2023   INR 1.1 06/28/2021   Studies/Results: US  LIVER DOPPLER Result Date: 12/29/2023 CLINICAL DATA:  Portal hypertension. EXAM: DUPLEX ULTRASOUND OF LIVER TECHNIQUE: Color and duplex Doppler ultrasound was performed to evaluate the hepatic in-flow and out-flow vessels. COMPARISON:  CT abdomen and pelvis 12/25/2023. FINDINGS: Liver: Normal parenchymal echogenicity. There is nodular liver contour. No focal lesion, mass or intrahepatic biliary ductal dilatation. Main Portal Vein size: 1.3 cm cm Portal Vein Velocities-all hepatopetal. Main Prox:  21.5 cm/sec Main Mid: 26.1 cm/sec Main Dist:  25.7 cm/sec Right: 16.1 cm/sec Left: 16 point cm/sec Hepatic Vein Velocities-all hepatofugal. Right:  57.5 cm/sec Middle:  23.0 cm/sec Left:  33.6 cm/sec IVC: Present and patent with normal respiratory phasicity. Hepatic Artery Velocity:  46.2 cm/sec Splenic Vein Velocity:  26.4 cm/sec Spleen:  15.6 cm x 7.0 cm x 6.8 cm with a total volume of 3 6 cm^3 (411 cm^3 is upper limit normal) Portal Vein Occlusion/Thrombus: No Splenic Vein Occlusion/Thrombus: No Ascites: Complex free fluid is seen throughout the abdomen which contains internal echoes and possible septations. Varices: None IMPRESSION: 1. Patent portal vein with normal direction of flow. 2. Cirrhotic liver morphology. 3. Splenomegaly. 4. Complex free fluid throughout the abdomen. Electronically Signed   By: Greig Pique M.D.   On: 12/29/2023 19:56    Medications:   ceFAZolin  (ANCEF ) IV Stopped (12/30/23 1255)    carvedilol   25 mg Oral BID   Chlorhexidine  Gluconate Cloth  6 each Topical Q0600   docusate sodium   100 mg Oral BID   doxercalciferol   2 mcg Intravenous Q T,Th,Sa-HD   feeding supplement (NEPRO CARB STEADY)  237 mL Oral Q24H   folic acid   1 mg Oral Daily   hydroxychloroquine   200 mg Oral BID   levothyroxine   50 mcg Oral QAC breakfast   multivitamin  1 tablet Oral QHS   pantoprazole   40 mg Oral Daily   polyethylene glycol  17 g Oral Daily   predniSONE   15 mg Oral Q breakfast   thiamine   100 mg Oral Daily    Dialysis Orders: TTS - AF 3:45hr, 400/A1.5, EDW 71kg, 2K/2C bath. AVF, no heparin  - Mircera 150mcg IV q 2 weeks (last 8/12) - Hectoral 2mcg IV q HD  Assessment/Plan: SBP: Fluid Cx + Klebsiella, on course of Cefazolin . Symptoms much improved, btu repeat peritoneal cell counts climbing with very high LDH and negative bacterial Cx. ID involved -> fungal Cx negative so far, TB testing in process, malignancy as well as chylous ascites being considered. Will be finishing 3 week course of Cefazolin . AMS: Infection v. alcohol withdrawal, resolved. ESRD: Continue HD on TTS schedule - next HD 9/6. HTN/volume: BP stable now - ARB and hydralazine d/c'd - remains on BB, UF as tolerated. Anemia of ESRD: Hgb 9 - resume ESA as outpatient. Secondary HPTH: CorrCa/Phos ok - resumed hectoral q HD, not getting binders  here Nutrition: Alb very low, continue protein supps. SLE: C3/4 low, dsDNA high - c/w flare - on plaquenil  and prednisone  taper - feels better. Recurrent ascites/cirrhosis: Gets intermittent paracenteses since 2022.  Angel Piety, NP [facility] 12/31/2023,12:37 PM  LOS: 17 days

## 2023-12-31 NOTE — Plan of Care (Signed)
  Problem: Pain Managment: Goal: General experience of comfort will improve and/or be controlled Outcome: Progressing

## 2023-12-31 NOTE — Plan of Care (Signed)
  Problem: Health Behavior/Discharge Planning: Goal: Ability to manage health-related needs will improve Outcome: Progressing   Problem: Clinical Measurements: Goal: Ability to maintain clinical measurements within normal limits will improve Outcome: Progressing Goal: Will remain free from infection Outcome: Progressing Goal: Respiratory complications will improve Outcome: Progressing Goal: Cardiovascular complication will be avoided Outcome: Progressing   Problem: Activity: Goal: Risk for activity intolerance will decrease Outcome: Progressing   Problem: Nutrition: Goal: Adequate nutrition will be maintained Outcome: Progressing   Problem: Coping: Goal: Level of anxiety will decrease Outcome: Progressing   Problem: Elimination: Goal: Will not experience complications related to bowel motility Outcome: Progressing Goal: Will not experience complications related to urinary retention Outcome: Progressing   Problem: Pain Managment: Goal: General experience of comfort will improve and/or be controlled Outcome: Progressing   Problem: Safety: Goal: Ability to remain free from injury will improve Outcome: Progressing   Problem: Skin Integrity: Goal: Risk for impaired skin integrity will decrease Outcome: Progressing   Problem: Safety: Goal: Non-violent Restraint(s) Outcome: Progressing   Problem: Safety: Goal: Non-violent Restraint(s) Outcome: Progressing   Problem: Fluid Volume: Goal: Compliance with measures to maintain balanced fluid volume will improve Outcome: Progressing   Problem: Health Behavior/Discharge Planning: Goal: Ability to manage health-related needs will improve Outcome: Progressing   Problem: Nutritional: Goal: Ability to make healthy dietary choices will improve Outcome: Progressing   Problem: Clinical Measurements: Goal: Complications related to the disease process, condition or treatment will be avoided or minimized Outcome:  Progressing

## 2023-12-31 NOTE — Progress Notes (Signed)
 PROGRESS NOTE  [name] FMW:[id number] DOB: 18-Jan-1971   PCP: Berneta Elsie Sayre, MD  Patient is from: Home.  DOA: 12/14/2023 LOS: 17  Chief complaints Chief Complaint  Patient presents with   Abdominal Pain     Brief Narrative / Interim history: 53yo with h/o ESRD on TTS HD, SLE, ascites without diagnosis of cirrhosis, and chronic HFrEF who presented on 8/19 with abdominal pain. Last paracentesis was in 09/2023.  Patient underwent paracentesis with removal of 1.8 L on 8/19.  Fluid analysis consistent with SBP.  Culture positive for Klebsiella.  Treated with Ceftriaxone  -> Cefazolin  x 14 days. Underwent repeat para on 8/26, 8/29 and 9/1 with removal of 700 cc, 2.2 L and 920 cc fluids respectively.  Despite antibiotics, subsequent fluid analysis with worsening neutrophil counts.  Recurrent ascites felt to be due to hypoalbuminemia.  Further fluid studies including AFS, AFC, MT-RIF if NAA, fungal serologies... Pending.  Cryptococcal antigen negative.  GI following.  ID seems to have signed off.  Subjective: Seen and examined earlier this morning.  Continues to endorse nausea and vomiting.  He says his pain has improved after he threw up.  He is frustrated about lack of sleep and rest due to frequent interruption, blood draws, vital signs...   Objective: Vitals:   12/30/23 2011 12/31/23 0508 12/31/23 0712 12/31/23 0746  BP: (!) 161/66 (!) 166/66  (!) 168/91  Pulse: 85 80  97  Resp: 17 20  18   Temp: 98.3 F (36.8 C) 98.9 F (37.2 C)  98 F (36.7 C)  TempSrc: Oral Oral  Oral  SpO2: 97% 99%  95%  Weight:   62.3 kg   Height:        Examination:  GENERAL: No apparent distress.  Appears frail. HEENT: MMM.  Vision and hearing grossly intact.  NECK: Supple.  No apparent JVD.  RESP:  No IWOB.  Fair aeration bilaterally. CVS:  RRR. Heart sounds normal.  ABD/GI/GU: Patient did not allow me to palpate abdomen. MSK/EXT:  Moves extremities. No apparent deformity. No edema.   SKIN: no apparent skin lesion or wound NEURO: AA.  Oriented appropriately.  No apparent focal neuro deficit. PSYCH: Calm. Normal affect.   Consultants:  Gastroenterology Interventional radiology Infectious disease Nephrology  Procedures: 8/19, 8/26, 8/29 and 9/1-paracentesis  Microbiology summarized: 8/19-peritoneal fluid with Klebsiella pneumoniae 8/19-blood cultures NGTD 8/29-peritoneal fluid culture NGTD 9/1-peritoneal fluid culture NGTD  Assessment and plan: Spontaneous bacterial peritonitis-initial peritoneal fluid culture on 8/91 with Klebsiella pneumonia.  Patient treated with ceftriaxone  and antibiotic escalated to IV Ancef .  Subsequent fluid analysis with worsening neutrophilic counts despite negative cultures.  Cryptococcal antigen, QuantiFERON gold and fungal culture negative.  Concern about autoimmune response from underlying lupus contributing.  Fungal culture negative.  QuantiFERON gold indeterminate. Liver Doppler with patent portal vein and normal directional flow, cirrhotic liver morphology, splenomegaly and complex free fluid throughout the abdomen.  -Continue IV Ancef  until 01/11/2024 -Follow  AFS, AFC,  fungal serologies. -Follow IgG, ANA, ASMA, TTG IgA, A1A, anemia panel   Recurrent ascites without diagnosis of liver cirrhosis-liver Doppler raises concern for cirrhosis morphology and splenomegaly. -S/p multiple paracentesis as above. -Abdominal Doppler as above    Systemic lupus erythematosus with lung involvement -Per nephrology, lupus flare are less, in HD patient but still possible. -On Plaquenil .  Prednisone  decreased from 20 to 15 mg by nephrology.   Acute metabolic encephalopathy: appeared very anxious, delirious, paranoid, even hallucinating on 8/22 Per sister - patient does drink to cover  pain from his lupus, has reduced his alcohol consumption, reports drinking 1.5 pints of alcohol at a time periodically but denies daily drinking.  Resolved  Nausea,  vomiting abdominal pain: Likely due to the above.   -Continue Tylenol  and oxycodone  as needed. -Antiemetics as needed -Start D5 and CBG monitoring every 12 hours -GI on board.  Chronic pancytopenia: Due to SLE, immunosuppression, renal disease -SCDs for DVT prophylaxis    ESRD on HD TTS -Per nephrology   Chronic HFpEF: Appears euvolemic on exam except for ascites. - Volume management by dialysis  Hypertension: BP slightly elevated. -Continue carvedilol  -Ultrafiltration per nephrology   Hypothyroidism: TSH 7.9.  Started on Synthroid  50 mcg daily but I do not know the clinical significance of slightly elevated TSH in acute setting. -Decrease Synthroid  to 25 mcg daily -Follow up TFTs in 4-6 weeks  Hyponatremia: Likely due to ESRD -Management per nephrology.   GERD -Continue Protonix    Increased nutrient needs Body mass index is 19.16 kg/m. Nutrition Problem: Increased nutrient needs Etiology: chronic illness Signs/Symptoms: estimated needs Interventions: Refer to RD note for recommendations, Nepro shake, MVI   DVT prophylaxis:  SCDs Start: 12/14/23 1804  Code Status: Full code Family Communication: Updated patient and sister over the phone. Level of care: Med-Surg Status is: Inpatient Remains inpatient appropriate because: Spontaneous peritonitis, recurrent ascites   Final disposition: Home with home health once medically stable   55 minutes with more than 50% spent in reviewing records, counseling patient/family and coordinating care.   Sch Meds:  Scheduled Meds:  carvedilol   25 mg Oral BID   Chlorhexidine  Gluconate Cloth  6 each Topical Q0600   docusate sodium   100 mg Oral BID   doxercalciferol   2 mcg Intravenous Q T,Th,Sa-HD   feeding supplement (NEPRO CARB STEADY)  237 mL Oral Q24H   folic acid   1 mg Oral Daily   hydroxychloroquine   200 mg Oral BID   levothyroxine   50 mcg Oral QAC breakfast   multivitamin  1 tablet Oral QHS   pantoprazole   40 mg Oral  Daily   polyethylene glycol  17 g Oral Daily   predniSONE   15 mg Oral Q breakfast   thiamine   100 mg Oral Daily   Continuous Infusions:   ceFAZolin  (ANCEF ) IV Stopped (12/30/23 1255)   PRN Meds:.acetaminophen  **OR** [DISCONTINUED] acetaminophen , albuterol , alum & mag hydroxide-simeth, bisacodyl , bisacodyl , melatonin, menthol -cetylpyridinium, ondansetron  **OR** ondansetron  (ZOFRAN ) IV, mouth rinse, oxyCODONE   Antimicrobials: Anti-infectives (From admission, onward)    Start     Dose/Rate Route Frequency Ordered Stop   12/30/23 1200  ceFAZolin  (ANCEF ) IVPB 2g/100 mL premix        2 g 200 mL/hr over 30 Minutes Intravenous Every T-Th-Sa (Hemodialysis) 12/28/23 1518 01/11/24 1159   12/30/23 0000  ceFAZolin  (ANCEF ) IVPB        2 g Intravenous Every T-Th-Sa (Hemodialysis) 12/28/23 1522 01/08/24 2359   12/29/23 1200  ceFAZolin  (ANCEF ) IVPB 1 g/50 mL premix        1 g 100 mL/hr over 30 Minutes Intravenous Every 24 hours 12/28/23 1518 12/29/23 1344   12/22/23 1400  hydroxychloroquine  (PLAQUENIL ) tablet 200 mg        200 mg Oral 2 times daily 12/22/23 1252     12/22/23 1200  ceFAZolin  (ANCEF ) IVPB 1 g/50 mL premix  Status:  Discontinued        1 g 100 mL/hr over 30 Minutes Intravenous Every 24 hours 12/21/23 1039 12/28/23 1518   12/15/23 0800  cefTRIAXone  (ROCEPHIN ) 2  g in sodium chloride  0.9 % 100 mL IVPB  Status:  Discontinued        2 g 200 mL/hr over 30 Minutes Intravenous Every 24 hours 12/14/23 1740 12/21/23 1039   12/14/23 1745  metroNIDAZOLE  (FLAGYL ) IVPB 500 mg  Status:  Discontinued        500 mg 100 mL/hr over 60 Minutes Intravenous Every 12 hours 12/14/23 1740 12/17/23 1138   12/14/23 1545  cefTRIAXone  (ROCEPHIN ) 2 g in sodium chloride  0.9 % 100 mL IVPB        2 g 200 mL/hr over 30 Minutes Intravenous  Once 12/14/23 1534 12/14/23 1649        I have personally reviewed the following labs and images: CBC: Recent Labs  Lab 12/26/23 0727 12/27/23 0508 12/29/23 0423  12/30/23 1459 12/31/23 0947  WBC 5.0 4.2 3.7* 7.3 8.4  NEUTROABS 4.6 3.2 2.9  --   --   HGB 9.6* 9.9* 9.0* 9.8* 9.1*  HCT 30.7* 31.1* 27.7* 29.8* 27.7*  MCV 88.7 86.4 86.3 86.4 87.1  PLT 103* 115* 104* 126* 124*   BMP &GFR Recent Labs  Lab 12/25/23 0854 12/26/23 0727 12/27/23 0508 12/29/23 0423 12/30/23 1459 12/31/23 0947  NA 128* 129* 129* 128* 132* 131*  K 5.2* 4.7 5.3* 4.5 4.2 5.0  CL 92* 94* 92* 92* 94* 93*  CO2 26 25 27 28 28  21*  GLUCOSE 120* 83 90 91 93 69*  BUN 53* 37* 49* 40* 27* 43*  CREATININE 6.04* 4.45* 5.60* 5.00* 3.68* 5.22*  CALCIUM  7.7* 7.5* 7.8* 7.1* 7.6* 7.7*  MG  --   --   --   --  1.8 2.0  PHOS 6.2*  --   --   --   --  6.3*   Estimated Creatinine Clearance: 14.3 mL/min (A) (by C-G formula based on SCr of 5.22 mg/dL (H)). Liver & Pancreas: Recent Labs  Lab 12/26/23 0727 12/27/23 0508 12/29/23 0423 12/30/23 1459 12/31/23 0947  AST 36 32 31 33  --   ALT 6 6 <5 <5  --   ALKPHOS 92 83 75 78  --   BILITOT 0.4 0.7 0.5 0.8  --   PROT 6.2* 6.3* 5.9* 6.7  --   ALBUMIN  1.5* 1.6* 1.6* 1.8* 1.7*   No results for input(s): LIPASE, AMYLASE in the last 168 hours. No results for input(s): AMMONIA in the last 168 hours. Diabetic: No results for input(s): HGBA1C in the last 72 hours. No results for input(s): GLUCAP in the last 168 hours. Cardiac Enzymes: No results for input(s): CKTOTAL, CKMB, CKMBINDEX, TROPONINI in the last 168 hours. No results for input(s): PROBNP in the last 8760 hours. Coagulation Profile: Recent Labs  Lab 12/30/23 1459  INR 1.2    Thyroid  Function Tests: No results for input(s): TSH, T4TOTAL, FREET4, T3FREE, THYROIDAB in the last 72 hours. Lipid Profile: No results for input(s): CHOL, HDL, LDLCALC, TRIG, CHOLHDL, LDLDIRECT in the last 72 hours. Anemia Panel: Recent Labs    12/31/23 0947  FERRITIN 1,632*  TIBC 123*  IRON 26*   Urine analysis:    Component Value Date/Time    COLORURINE AMBER (A) 06/29/2021 0425   APPEARANCEUR HAZY (A) 06/29/2021 0425   LABSPEC 1.018 06/29/2021 0425   PHURINE 7.0 06/29/2021 0425   GLUCOSEU NEGATIVE 06/29/2021 0425   HGBUR SMALL (A) 06/29/2021 0425   BILIRUBINUR NEGATIVE 06/29/2021 0425   KETONESUR 5 (A) 06/29/2021 0425   PROTEINUR >=300 (A) 06/29/2021 0425   NITRITE NEGATIVE 06/29/2021  0425   LEUKOCYTESUR NEGATIVE 06/29/2021 0425   Sepsis Labs: Invalid input(s): PROCALCITONIN, LACTICIDVEN  Microbiology: Recent Results (from the past 240 hours)  Body fluid culture w Gram Stain     Status: None   Collection Time: 12/24/23 12:35 PM   Specimen: Abdomen; Peritoneal Fluid  Result Value Ref Range Status   Specimen Description PERITONEAL  Final   Special Requests NONE  Final   Gram Stain   Final    RARE WBC PRESENT,BOTH PMN AND MONONUCLEAR NO ORGANISMS SEEN    Culture   Final    NO GROWTH 3 DAYS Performed at [facility], [address], [place], [place] [address]    Report Status 12/27/2023 FINAL  Final  MT-RIF NAA W Cult, Non-Sputum     Status: None (Preliminary result)   Collection Time: 12/26/23 12:17 PM   Specimen: Pleural Fluid  Result Value Ref Range Status   Source PERITONEAL  Final    Comment: Performed at [facility], [address], [place], [place] [address]   Specimen Source [facility]  Final    Comment: (NOTE) Test not performed. The required specimen for the test ordered was not received. Received: Peritoneal Fluid Requires: BAL, Pleural or CSF Notified Tomadur S. 12/30/2023    M tuberculosis complex WRORD  Final    Comment: (NOTE) Test not performed. The required specimen for the test ordered was not received. Received: Peritoneal Fluid Requires: BAL, Pleural or CSF Notified Tomadur S. 12/30/2023 This test was developed and its performance characteristics determined by [facility]. It has not been cleared or approved by the Food and Drug Administration.    Rifampin [facility]  Final     Comment: (NOTE) Test not performed. The required specimen for the test ordered was not received. Received: Peritoneal Fluid Requires: BAL, Pleural or CSF Notified Tomadur S. 12/30/2023 This test was developed and its performance characteristics determined by [facility]. It has not been cleared or approved by the Food and Drug Administration.    AFB Specimen Processing Concentration  Final    Comment: (NOTE) Performed At: [facility] [address] [place], [place] [id number] Jennette Shorter MD Ey:[phone]    Acid Fast Culture PENDING  Incomplete  Body fluid culture w Gram Stain     Status: None   Collection Time: 12/27/23 10:49 AM   Specimen: Peritoneal Washings; Body Fluid  Result Value Ref Range Status   Specimen Description PERITONEAL  Final   Special Requests NONE  Final   Gram Stain   Final    MODERATE WBC PRESENT, PREDOMINANTLY PMN NO ORGANISMS SEEN    Culture   Final    NO GROWTH 3 DAYS Performed at [facility], [address] [place], [place] [address]    Report Status 12/30/2023 FINAL  Final  Culture, fungus without smear     Status: None (Preliminary result)   Collection Time: 12/27/23 10:49 AM   Specimen: Peritoneal Washings; Other  Result Value Ref Range Status   Specimen Description PERITONEAL  Final   Special Requests NONE  Final   Culture   Final    NO FUNGUS ISOLATED AFTER 3 DAYS Performed at [facility], [address] [place] [address] [place], [place] [address]    Report Status PENDING  Incomplete  Blastomyces Antigen     Status: None   Collection Time: 12/27/23  6:26 PM   Specimen: Blood  Result Value Ref Range Status   Blastomyces Antigen None Detected None Detected ng/mL Final  Comment: (NOTE) Reference Interval: None Detected Reportable Range: 0.31 ng/mL - 20.00 ng/mL Results above 20.00 ng/mL are reported as 'Positive, Above the Limit of Quantification' This test was developed and its performance characteristics determined by  [facility] [employer]. It has not been cleared or approved by the FDA; however, FDA clearance or approval is not currently required for clinical use. The results are not intended to be used as the sole means for clinical diagnosis or patient decisions.    Interpretation Negative  Final   Specimen Type SERUM  Final    Comment: (NOTE) Performed At: [facility] [address] [place], [place] [id number] [place] Pac MD Ey:[phone]     Radiology Studies: No results found.     Meagen Limones T. Saverio Kader [facility]  If 7PM-7AM, please contact night-coverage [email or web address] 12/31/2023, 12:54 PM

## 2023-12-31 NOTE — Progress Notes (Signed)
 Occupational Therapy Treatment Patient Details Name: [name] MRN: [id number] DOB: 1971-03-11 Today's Date: 12/31/2023   History of present illness Pt is a 53 y/o M admitted on 12/14/23 after presenting with c/o abdominal pain. Pt is being treated for spontaneous bacterial peritonitis, acute metabolic encephalopathy. PMH: ESRD on HD TTS, lupus, ascites, systolic CHF   OT comments  Limited session on this date due to patient's complaints of nausea. Patient declined OT initially due to nausea but asked to get to EOB and change t-shirts.  Patient required increased time and assistance for bed mobility and UB dressing. Patient declined standing from EOB and scooting towards HOB before returning to supine. Discharge recommendations continue to be appropriate. Acute OT to continue to follow to address established goals.       If plan is discharge home, recommend the following:  A little help with walking and/or transfers;A little help with bathing/dressing/bathroom;Assist for transportation   Equipment Recommendations  None recommended by OT    Recommendations for Other Services      Precautions / Restrictions Precautions Precautions: Fall Recall of Precautions/Restrictions: Impaired Restrictions Weight Bearing Restrictions Per Provider Order: No       Mobility Bed Mobility Overal bed mobility: Needs Assistance Bed Mobility: Supine to Sit, Sit to Supine     Supine to sit: HOB elevated, Used rails, Min assist Sit to supine: Supervision   General bed mobility comments: required min assist with trunk to get to EOB    Transfers Overall transfer level: Needs assistance Equipment used: None              Lateral/Scoot Transfers: Supervision General transfer comment: Patient declined standing from EOB and performed lateral scooting towards HOB before returning to supine     Balance Overall balance assessment: Needs assistance Sitting-balance support: Feet supported, No  upper extremity supported Sitting balance-Leahy Scale: Good         Standing balance comment: declined standing                           ADL either performed or assessed with clinical judgement   ADL Overall ADL's : Needs assistance/impaired     Grooming: Set up;Sitting Grooming Details (indicate cue type and reason): on EOB         Upper Body Dressing : Minimal assistance;Standing Upper Body Dressing Details (indicate cue type and reason): changed t-shirts seated on EOB requiring min assist due to patient stating feeling weak                   General ADL Comments: limited self care tasks performed due to patient stating having N&V    Extremity/Trunk Assessment              Vision       Perception     Praxis     Communication Communication Communication: Impaired Factors Affecting Communication:  (hoarse voice)   Cognition Arousal: Alert Behavior During Therapy: Flat affect, Agitated               OT - Cognition Comments: Patient agitated over being nauseated throughout the night                 Following commands: Intact Following commands impaired: Follows one step commands with increased time      Cueing   Cueing Techniques: Verbal cues, Tactile cues  Exercises      Shoulder Instructions  General Comments patient not feeling well with complaints of N&V throughout night    Pertinent Vitals/ Pain       Pain Assessment Pain Assessment: Faces Faces Pain Scale: Hurts a little bit Pain Location: abdomen from nausea Pain Descriptors / Indicators: Grimacing Pain Intervention(s): Monitored during session, Repositioned  Home Living                                          Prior Functioning/Environment              Frequency  Min 2X/week        Progress Toward Goals  OT Goals(current goals can now be found in the care plan section)  Progress towards OT goals: Progressing toward  goals  Acute Rehab OT Goals Patient Stated Goal: feel better and to go home OT Goal Formulation: With patient Time For Goal Achievement: 01/03/24 Potential to Achieve Goals: Good ADL Goals Pt Will Perform Upper Body Dressing: with set-up;with supervision Pt Will Perform Lower Body Dressing: with min assist Pt Will Transfer to Toilet: with contact guard assist Pt Will Perform Toileting - Clothing Manipulation and hygiene: with set-up;with contact guard assist Additional ADL Goal #1: Pt will be able to follow commands >75% consistently to maximize safety and functional independence with ADLs/transfers  Plan      Co-evaluation                 AM-PAC OT 6 Clicks Daily Activity     Outcome Measure   Help from another person eating meals?: A Little Help from another person taking care of personal grooming?: A Little Help from another person toileting, which includes using toliet, bedpan, or urinal?: A Little Help from another person bathing (including washing, rinsing, drying)?: A Little Help from another person to put on and taking off regular upper body clothing?: A Little Help from another person to put on and taking off regular lower body clothing?: A Little 6 Click Score: 18    End of Session    OT Visit Diagnosis: Unsteadiness on feet (R26.81);Other abnormalities of gait and mobility (R26.89);Repeated falls (R29.6);Muscle weakness (generalized) (M62.81);Pain;Other symptoms and signs involving cognitive function Pain - part of body:  (abdomen)   Activity Tolerance Other (comment) (limited due to nausea)   Patient Left in bed;with call bell/phone within reach   Nurse Communication Mobility status;Other (comment) (patient with complaints of nausea)        Time: 9285-9268 OT Time Calculation (min): 17 min  Charges: OT General Charges $OT Visit: 1 Visit OT Treatments $Self Care/Home Management : 8-22 mins  Dick Laine, OTA Acute Rehabilitation Services  Office  [phone]   Jeb LITTIE Laine 12/31/2023, 8:14 AM

## 2023-12-31 NOTE — Progress Notes (Signed)
 Inpatient Progress Note     Patient Profile/Chief Complaint  53 y.o. male with history of end-stage renal disease on hemodialysis, lupus, congestive heart failure with reduced EF/35 to 40%, chronic ascites without definitive cirrhosis felt to be secondary to renal failure and hypoalbuminemia admitted with generalized acute abdominal pain and rapid accumulation of ascites with cultures positive for Klebsiella consistent with acute bacterial peritonitis.  Treated with cephalosporin.  Repeat paracentesis 12/27/23 showed significantly elevated cell count 57,750, 93% neutrophils; fluid with a milky consistency.  Being evaluated by infectious disease for fungal etiologies as well as TB.    Liver Doppler 12/29/2023 showed cirrhotic liver morphology, splenomegaly, fluid throughout the abdomen with normal Doppler flows   Interval History   -- Abdominal pain is improving per patient -- States that he has nausea but vomiting has decreased; reports 1 episode today -- Does not feel there has been significant reaccumulation of fluid in his abdomen -- Expresses frustration regarding prolonged hospitalization   Objective   Vital signs in last 24 hours: Temp:  [98 F (36.7 C)-98.9 F (37.2 C)] 98 F (36.7 C) (09/05 1526) Pulse Rate:  [78-97] 78 (09/05 1526) Resp:  [17-20] 18 (09/05 1526) BP: (161-168)/(66-91) 168/86 (09/05 1526) SpO2:  [95 %-99 %] 98 % (09/05 1526) Weight:  [62.3 kg] 62.3 kg (09/05 0712) Last BM Date : 12/28/23 General:    Chronically ill-appearing gentleman resting quietly in bed Heart:  Regular rate and rhythm; no murmurs Lungs: Respirations even and unlabored, lungs CTA bilaterally Abdomen:  Soft, mildly distended, minimal tenderness to palpation normal bowel sounds. Extremities: Trace bilateral lower extremity edema Neurologic:  Alert and oriented,  grossly normal neurologically. Psych:  Cooperative. Normal mood and affect.  Intake/Output from previous day: 09/04 0701 -  09/05 0700 In: 176.9 [P.O.:60; IV Piggyback:116.9] Out: 3300 [Emesis/NG output:800] Intake/Output this shift: No intake/output data recorded.  Lab Results: Recent Labs    12/29/23 0423 12/30/23 1459 12/31/23 0947  WBC 3.7* 7.3 8.4  HGB 9.0* 9.8* 9.1*  HCT 27.7* 29.8* 27.7*  PLT 104* 126* 124*   BMET Recent Labs    12/29/23 0423 12/30/23 1459 12/31/23 0947  NA 128* 132* 131*  K 4.5 4.2 5.0  CL 92* 94* 93*  CO2 28 28 21*  GLUCOSE 91 93 69*  BUN 40* 27* 43*  CREATININE 5.00* 3.68* 5.22*  CALCIUM  7.1* 7.6* 7.7*   LFT Recent Labs    12/30/23 1459 12/31/23 0947  PROT 6.7  --   ALBUMIN  1.8* 1.7*  AST 33  --   ALT <5  --   ALKPHOS 78  --   BILITOT 0.8  --    PT/INR Recent Labs    12/30/23 1459  LABPROT 15.7*  INR 1.2   Paracentesis 12/27/2023 Moderate WBC present, predominantly PMN Culture negative thus far Cryptococcal antigen negative  Studies/Results: CTAP 12/14/2023 1. Moderate to large volume ascites, similar in overall volume to the previous CT. There is new diffuse smooth peritoneal thickening without apparent nodularity, suspicious for peritonitis. Consider diagnostic paracentesis. 2. Mild small bowel and colonic wall thickening, nonspecific in the setting of ascites. No evidence of bowel obstruction or perforation. The appendix is not discretely visualized, although there are no specific signs of appendicitis. 3. Progressive renal cortical thinning and atrophy bilaterally. No hydronephrosis or urinary tract calculus. 4. Chronic femoral head osteonecrosis bilaterally without subchondral collapse or significant secondary degenerative changes. 5.  Aortic Atherosclerosis (ICD10-I70.0).  CTAP 12/15/2023 1. Moderate volume ascites. Increasing peritoneal thickening  and enhancement about ascites particularly in the right abdomen and pelvis. Findings are suspicious for peritonitis. Generalized edema and soft tissue thickening of the omentum, nonspecific  in the setting of ascites. 2. Ascites with enhancement extends into a right inguinal hernia and into the scrotum. 3. Areas of colonic and small bowel wall thickening, nonspecific in the setting of ascites. 4. Wall thickening of the distal esophagus, can be seen with reflux or esophagitis. 5. Cardiomegaly with coronary artery calcifications. 6. Dilated main pulmonary artery, can be seen with pulmonary arterial hypertension. 7. Trace left pleural effusion. 8. Question of subtle capsular nodularity of the liver, can be seen with cirrhosis. Mild splenomegaly and tortuous splenic vein likely portal hypertension.  AUS with Doppler flows 12/29/2023 1. Patent portal vein with normal direction of flow. 2. Cirrhotic liver morphology. 3. Splenomegaly. 4. Complex free fluid throughout the abdomen.  Endoscopic Studies: None   Clinical Impression   53 y.o. male with history of end-stage renal disease on hemodialysis, lupus, congestive heart failure with reduced EF/35 to 40%, chronic ascites without definitive cirrhosis felt to be secondary to renal failure and hypoalbuminemia admitted with generalized acute abdominal pain and rapid accumulation of ascites with cultures positive for Klebsiella consistent with acute bacterial peritonitis.  Treated with cephalosporin.  Repeat paracentesis 12/27/23 showed significantly elevated cell count 57,750, 93% neutrophils; fluid with a milky consistency.  Being evaluated by infectious disease for fungal etiologies as well as TB.  Malignancy remains a consideration.  CT imaging 12/25/2023 shows question of subcapsular nodularity of the liver that can potentially be seen in the setting of cirrhosis, mild splenomegaly and tortuous splenic vein potentially related to portal hypertension which appear to be new findings.  These findings were incongruent with CT scan from 12/14/2023 which did not show any liver abnormalities. Previous SAAG was low at 0.8 suggesting absence of  portal hypertension.  Possible previous history of alcohol consumption.  Fib 4 elevated.  Abdominal ultrasound with Doppler flows obtained 12/29/2023 which did confirm cirrhotic liver morphology, splenomegaly but otherwise normal portal flows.  Laboratory studies for chronic hepatitides performed.  In speaking with Mercedes today he denies a history of liver disease.  He is vague regarding alcohol consumption.  Viral hepatitides have been ruled out by labs.  Other diagnostic considerations include autoimmune liver disease, hemochromatosis, celiac disease, cardiac cirrhosis given his underlying heart issues.  I had a lengthy discussion with his sister on the telephone today.  She reports that he underwent evaluation for cirrhosis 3 years ago and was not found to have any form of liver disease or definitive evidence of cirrhosis.  We reviewed his recent CT and ultrasound findings.  She expresses that they do not wish to pursue any further evaluation or workup for liver disease.  His sister reports that she requested GI consultation for evaluation of hernia.  Advised that the surgical team evaluates hernias and not GI.  Also answered her questions regarding SBP and bowel habits contributing to abdominal discomfort.   Plan  Follow-up serologic evaluation for chronic hepatitides: ASMA, ANA, IgG, TTG IgA, IgA, iron panel, alpha 1 antitrypsin phenotype. Follow-up cultures from most recent paracentesis 12/27/2023 Continue cefazolin  x 3 weeks for Klebsiella SBP Continue antiemetics and supportive management for abdominal pain, nausea and vomiting Patient's sister informed me that they do not wish to pursue any further workup or evaluation for a potential diagnosis of cirrhosis.  I will leave the decision of whether or not they would like to follow-up in our office  to Oretta and his sister.  If they do wish to follow-up we can coordinate a future appointment and discuss additional workup with elastography and EGD for  esophageal varices screening    LOS: 17 days   [name]  12/31/2023, 3:31 PM  [name], MD [facility]

## 2023-12-31 NOTE — Progress Notes (Signed)
 [facility] night cross cover note:   I was notified by the patient's RN the patient is complaining of some right elbow discomfort after falling 1 week ago, with the appearance of some bruising, swelling at the site of the right elbow.  I subsequently ordered plain films of the right elbow to further assess the above.  I confirmed an existing order for as needed acetaminophen  for pain.    Eva Pore, DO Hospitalist

## 2023-12-31 NOTE — Plan of Care (Signed)
  Problem: Health Behavior/Discharge Planning: Goal: Ability to manage health-related needs will improve 12/31/2023 1341 by Shela Lapine, RN Outcome: Progressing 12/31/2023 1027 by Shela Lapine, RN Outcome: Progressing   Problem: Clinical Measurements: Goal: Ability to maintain clinical measurements within normal limits will improve 12/31/2023 1341 by Shela Lapine, RN Outcome: Progressing 12/31/2023 1027 by Shela Lapine, RN Outcome: Progressing Goal: Will remain free from infection 12/31/2023 1341 by Shela Lapine, RN Outcome: Progressing 12/31/2023 1027 by Shela Lapine, RN Outcome: Progressing Goal: Respiratory complications will improve 12/31/2023 1341 by Shela Lapine, RN Outcome: Progressing 12/31/2023 1027 by Shela Lapine, RN Outcome: Progressing Goal: Cardiovascular complication will be avoided 12/31/2023 1341 by Shela Lapine, RN Outcome: Progressing 12/31/2023 1027 by Shela Lapine, RN Outcome: Progressing   Problem: Activity: Goal: Risk for activity intolerance will decrease 12/31/2023 1341 by Shela Lapine, RN Outcome: Progressing 12/31/2023 1027 by Shela Lapine, RN Outcome: Progressing   Problem: Nutrition: Goal: Adequate nutrition will be maintained 12/31/2023 1341 by Shela Lapine, RN Outcome: Progressing 12/31/2023 1027 by Shela Lapine, RN Outcome: Progressing   Problem: Elimination: Goal: Will not experience complications related to bowel motility 12/31/2023 1341 by Shela Lapine, RN Outcome: Progressing 12/31/2023 1027 by Shela Lapine, RN Outcome: Progressing Goal: Will not experience complications related to urinary retention 12/31/2023 1341 by Shela Lapine, RN Outcome: Progressing 12/31/2023 1027 by Shela Lapine, RN Outcome: Progressing   Problem: Pain Managment: Goal: General experience of comfort will improve and/or be controlled 12/31/2023 1341 by Shela Lapine, RN Outcome: Progressing 12/31/2023 1027 by Shela Lapine, RN Outcome: Progressing    Problem: Safety: Goal: Ability to remain free from injury will improve 12/31/2023 1341 by Shela Lapine, RN Outcome: Progressing 12/31/2023 1027 by Shela Lapine, RN Outcome: Progressing   Problem: Skin Integrity: Goal: Risk for impaired skin integrity will decrease 12/31/2023 1341 by Shela Lapine, RN Outcome: Progressing 12/31/2023 1027 by Shela Lapine, RN Outcome: Progressing   Problem: Safety: Goal: Non-violent Restraint(s) 12/31/2023 1341 by Shela Lapine, RN Outcome: Progressing 12/31/2023 1027 by Shela Lapine, RN Outcome: Progressing   Problem: Safety: Goal: Non-violent Restraint(s) 12/31/2023 1341 by Shela Lapine, RN Outcome: Progressing 12/31/2023 1027 by Shela Lapine, RN Outcome: Progressing   Problem: Fluid Volume: Goal: Compliance with measures to maintain balanced fluid volume will improve 12/31/2023 1341 by Shela Lapine, RN Outcome: Progressing 12/31/2023 1027 by Shela Lapine, RN Outcome: Progressing   Problem: Health Behavior/Discharge Planning: Goal: Ability to manage health-related needs will improve 12/31/2023 1341 by Shela Lapine, RN Outcome: Progressing 12/31/2023 1027 by Shela Lapine, RN Outcome: Progressing   Problem: Nutritional: Goal: Ability to make healthy dietary choices will improve 12/31/2023 1341 by Shela Lapine, RN Outcome: Progressing 12/31/2023 1027 by Shela Lapine, RN Outcome: Progressing   Problem: Clinical Measurements: Goal: Complications related to the disease process, condition or treatment will be avoided or minimized 12/31/2023 1341 by Shela Lapine, RN Outcome: Progressing 12/31/2023 1027 by Shela Lapine, RN Outcome: Progressing

## 2023-12-31 NOTE — Progress Notes (Signed)
 PT Cancellation Note  Patient Details Name: [name] MRN: [id number] DOB: 1970-08-29   Cancelled Treatment:    Reason Eval/Treat Not Completed: Patient declined, states he is too nauseated to participate with PT today. Reports he will vomit if he attempts.  Will continue to follow and progress as tolerated.  [name], PT, DPT [facility] Physical Therapist Office: [phone] Website: [email or web address]   [name] 12/31/2023, 12:42 PM

## 2023-12-31 NOTE — Progress Notes (Signed)
 This chaplain is present with the Pt. for the notarizing of the Pt. Advance Directive, HCPOA only. The Pt. is not completing a Living Will. Spiritual Care previously completed AD education with the Pt. The Pt.is able to answer this chaplain's clarifying questions.  The chaplain is present with the Pt., Pt. sister-Michelle, notary, and witnesses for the notarizing of the Pt. AD. The Pt. is naming [name] as his healthcare agent.  The chaplain gave the Pt. the original AD along with one copy. The chaplain scanned the Pt. AD into the Pt. EMR.  This chaplain is available for F/U spiritual care as needed.  Chaplain Leeroy Hummer [phone]

## 2024-01-01 DIAGNOSIS — I5022 Chronic systolic (congestive) heart failure: Secondary | ICD-10-CM | POA: Diagnosis not present

## 2024-01-01 DIAGNOSIS — K652 Spontaneous bacterial peritonitis: Secondary | ICD-10-CM | POA: Diagnosis not present

## 2024-01-01 DIAGNOSIS — N186 End stage renal disease: Secondary | ICD-10-CM | POA: Diagnosis not present

## 2024-01-01 DIAGNOSIS — E039 Hypothyroidism, unspecified: Secondary | ICD-10-CM | POA: Diagnosis not present

## 2024-01-01 LAB — CBC
HCT: 25.6 % — ABNORMAL LOW (ref 39.0–52.0)
Hemoglobin: 8.3 g/dL — ABNORMAL LOW (ref 13.0–17.0)
MCH: 27.8 pg (ref 26.0–34.0)
MCHC: 32.4 g/dL (ref 30.0–36.0)
MCV: 85.6 fL (ref 80.0–100.0)
Platelets: 147 K/uL — ABNORMAL LOW (ref 150–400)
RBC: 2.99 MIL/uL — ABNORMAL LOW (ref 4.22–5.81)
RDW: 16.8 % — ABNORMAL HIGH (ref 11.5–15.5)
WBC: 7.3 K/uL (ref 4.0–10.5)
nRBC: 0 % (ref 0.0–0.2)

## 2024-01-01 LAB — ENA+DNA/DS+ANTICH+CENTRO+JO...
Anti JO-1: <0.2 AI (ref 0.0–0.9)
Centromere Ab Screen: <0.2 AI (ref 0.0–0.9)
Chromatin Ab SerPl-aCnc: >8 AI — ABNORMAL HIGH (ref 0.0–0.9)
ENA SM Ab Ser-aCnc: 1.4 AI — ABNORMAL HIGH (ref 0.0–0.9)
Ribonucleic Protein: 1 AI — ABNORMAL HIGH (ref 0.0–0.9)
SSA (Ro) (ENA) Antibody, IgG: 2.7 AI — ABNORMAL HIGH (ref 0.0–0.9)
SSB (La) (ENA) Antibody, IgG: <0.2 AI (ref 0.0–0.9)
Scleroderma (Scl-70) (ENA) Antibody, IgG: 0.7 AI (ref 0.0–0.9)
ds DNA Ab: 134 [IU]/mL — ABNORMAL HIGH (ref 0–9)

## 2024-01-01 LAB — SODIUM: Sodium: 127 mmol/L — ABNORMAL LOW (ref 135–145)

## 2024-01-01 LAB — COMPREHENSIVE METABOLIC PANEL WITH GFR
ALT: <5 U/L (ref 0–44)
AST: 25 U/L (ref 15–41)
Albumin: 1.6 g/dL — ABNORMAL LOW (ref 3.5–5.0)
Alkaline Phosphatase: 59 U/L (ref 38–126)
Anion gap: 13 (ref 5–15)
BUN: 55 mg/dL — ABNORMAL HIGH (ref 6–20)
CO2: 23 mmol/L (ref 22–32)
Calcium: 7.4 mg/dL — ABNORMAL LOW (ref 8.9–10.3)
Chloride: 84 mmol/L — ABNORMAL LOW (ref 98–111)
Creatinine, Ser: 6.03 mg/dL — ABNORMAL HIGH (ref 0.61–1.24)
GFR, Estimated: 10 mL/min — ABNORMAL LOW (ref >60)
Glucose, Bld: 358 mg/dL — ABNORMAL HIGH (ref 70–99)
Potassium: 5.2 mmol/L — ABNORMAL HIGH (ref 3.5–5.1)
Sodium: 122 mmol/L — ABNORMAL LOW (ref 135–145)
Total Bilirubin: 0.8 mg/dL (ref 0.0–1.2)
Total Protein: 6.1 g/dL — ABNORMAL LOW (ref 6.5–8.1)

## 2024-01-01 LAB — ANA W/REFLEX IF POSITIVE: Anti Nuclear Antibody (ANA): POSITIVE — AB

## 2024-01-01 LAB — MAGNESIUM: Magnesium: 1.9 mg/dL (ref 1.7–2.4)

## 2024-01-01 MED ORDER — LIDOCAINE HCL (PF) 1 % IJ SOLN: 5.0000 mL | INTRAMUSCULAR | Status: DC | PRN

## 2024-01-01 MED ORDER — ALTEPLASE 2 MG IJ SOLR: 2.0000 mg | Freq: Once | INTRAMUSCULAR | Status: DC | PRN

## 2024-01-01 MED ORDER — HEPARIN SODIUM (PORCINE) 1000 UNIT/ML IJ SOLN
INTRAMUSCULAR | Status: AC
  Filled 2024-01-01: qty 2

## 2024-01-01 MED ORDER — DOXERCALCIFEROL 4 MCG/2ML IV SOLN
INTRAVENOUS | Status: AC
  Filled 2024-01-01: qty 2

## 2024-01-01 MED ORDER — LIDOCAINE-PRILOCAINE 2.5-2.5 % EX CREA: 1.0000 | TOPICAL_CREAM | CUTANEOUS | Status: DC | PRN

## 2024-01-01 MED ORDER — HEPARIN SODIUM (PORCINE) 1000 UNIT/ML DIALYSIS
20.0000 [IU]/kg | INTRAMUSCULAR | Status: DC | PRN
  Administered 2024-01-01: 1200 [IU] via INTRAVENOUS_CENTRAL
  Filled 2024-01-01: qty 2

## 2024-01-01 MED ORDER — HEPARIN SODIUM (PORCINE) 1000 UNIT/ML DIALYSIS: 1000.0000 [IU] | INTRAMUSCULAR | Status: DC | PRN

## 2024-01-01 MED ORDER — PENTAFLUOROPROP-TETRAFLUOROETH EX AERO: 1.0000 | INHALATION_SPRAY | CUTANEOUS | Status: DC | PRN

## 2024-01-01 NOTE — Progress Notes (Signed)
 Received patient in bed to unit.  Alert and oriented.  Informed consent signed and in chart.   TX duration: 2 hours and 36 minutes.  Patient cartridge clotted off with 54 minutes left but patient wanted to come off and signed AMA paperwork.  Patient tolerated well.  Transported back to the room  Alert, without acute distress.  Hand-off given to patient's nurse.   Access used: Left forearm fistula Access issues: BFR to 350 due to high arterial pressures.  Total UF removed: Medication(s) given: Cefezolin   01/01/24 1810  Vitals  Temp 97.7 F (36.5 C)  BP (!) 163/73  Pulse Rate 79  ECG Heart Rate 79  Resp 16  Oxygen Therapy  SpO2 99 %  O2 Device Room Air  During Treatment Monitoring  Duration of HD Treatment -hour(s) 2.59 hour(s)  HD Safety Checks Performed Yes  Intra-Hemodialysis Comments See progress note  Post Treatment  Dialyzer Clearance Clear  Liters Processed 54.5  Fluid Removed (mL) 700 mL  Tolerated HD Treatment No (Comment)  Post-Hemodialysis Comments Patient cartridge clotted off with 54 minutes, but patient wanted to come off AMA paper work signed.  AVG/AVF Arterial Site Held (minutes) 7 minutes  AVG/AVF Venous Site Held (minutes) 7 minutes  Fistula / Graft Left Forearm Arteriovenous fistula  Placement Date/Time: 08/26/20 0807   Orientation: Left  Access Location: Forearm  Access Type: Arteriovenous fistula  Site Condition No complications  Fistula / Graft Assessment Present;Thrill;Bruit  Status Deaccessed  Drainage Description None     Camellia Brasil LPN [facility]

## 2024-01-01 NOTE — Plan of Care (Signed)
  Problem: Health Behavior/Discharge Planning: Goal: Ability to manage health-related needs will improve Outcome: Progressing   Problem: Clinical Measurements: Goal: Ability to maintain clinical measurements within normal limits will improve Outcome: Progressing Goal: Will remain free from infection Outcome: Progressing Goal: Respiratory complications will improve Outcome: Progressing Goal: Cardiovascular complication will be avoided Outcome: Progressing   Problem: Activity: Goal: Risk for activity intolerance will decrease Outcome: Progressing   Problem: Nutrition: Goal: Adequate nutrition will be maintained Outcome: Progressing   Problem: Coping: Goal: Level of anxiety will decrease Outcome: Progressing   Problem: Elimination: Goal: Will not experience complications related to bowel motility Outcome: Progressing Goal: Will not experience complications related to urinary retention Outcome: Progressing   Problem: Pain Managment: Goal: General experience of comfort will improve and/or be controlled Outcome: Progressing   Problem: Safety: Goal: Ability to remain free from injury will improve Outcome: Progressing   Problem: Skin Integrity: Goal: Risk for impaired skin integrity will decrease Outcome: Progressing   Problem: Safety: Goal: Non-violent Restraint(s) Outcome: Progressing   Problem: Safety: Goal: Non-violent Restraint(s) Outcome: Progressing   Problem: Fluid Volume: Goal: Compliance with measures to maintain balanced fluid volume will improve Outcome: Progressing   Problem: Health Behavior/Discharge Planning: Goal: Ability to manage health-related needs will improve Outcome: Progressing   Problem: Nutritional: Goal: Ability to make healthy dietary choices will improve Outcome: Progressing   Problem: Clinical Measurements: Goal: Complications related to the disease process, condition or treatment will be avoided or minimized Outcome:  Progressing

## 2024-01-01 NOTE — Progress Notes (Signed)
 PROGRESS NOTE  [name] FMW:[id number] DOB: 1970/11/17   PCP: Berneta Elsie Sayre, MD  Patient is from: Home.  DOA: 12/14/2023 LOS: 18  Chief complaints Chief Complaint  Patient presents with   Abdominal Pain     Brief Narrative / Interim history: 53yo with h/o ESRD on TTS HD, SLE, ascites without diagnosis of cirrhosis, and chronic HFrEF who presented on 8/19 with abdominal pain. Last paracentesis was in 09/2023.  Patient underwent paracentesis with removal of 1.8 L on 8/19.  Fluid analysis consistent with SBP.  Culture positive for Klebsiella.  Treated with Ceftriaxone  -> Cefazolin  x 14 days. Underwent repeat para on 8/26, 8/29 and 9/1 with removal of 700 cc, 2.2 L and 920 cc fluids respectively.  Despite antibiotics, subsequent fluid analysis with worsening neutrophil counts.  Recurrent ascites felt to be due to hypoalbuminemia but CT and liver Doppler suggesting cirrhotic changes.  There was also question if underlying lupus is contributing.  QuantiFERON gold and peritoneal tests for crypto, blasto, histo, AFS and fungal culture are negative.  However, peritoneal fluid is positive for Fungitell.  Peritoneal fluid AFC pending.  GI and ID following.  Discussion about need for peritoneal biopsy with sample sent for path, AFB, fungal and routine culture  Subjective: Seen and examined earlier this morning.  He had left arm pain and swelling.  Apparently, he had a fall earlier in his hospitalization.  X-ray from last night without acute osseous finding but soft tissue swelling.  He is very anxious to move his left arm or bend his elbow.  Has tenderness to palpation with bruising just above his right elbow.  No increased warmth to touch.  Nausea, vomiting and abdominal pain improved but he is not eating much.  Sister was asking if we can liberate diet.  I have discussed with nephrology and adjusted diet.   Objective: Vitals:   01/01/24 1502 01/01/24 1513 01/01/24 1530 01/01/24 1600   BP: (!) 165/68 (!) 174/69 (!) 169/75 (!) 166/75  Pulse: 69 70 66 72  Resp: 19 (!) 21 14 17   Temp: 97.9 F (36.6 C)     TempSrc: Oral     SpO2: 96% 97% 100% 100%  Weight: 59.6 kg     Height:        Examination:  GENERAL: No apparent distress.  Appears frail. HEENT: MMM.  Vision and hearing grossly intact.  NECK: Supple.  No apparent JVD.  RESP:  No IWOB.  Fair aeration bilaterally. CVS:  RRR. Heart sounds normal.  ABD/GI/GU: BS+.  Soft.  No significant tenderness with light palpation.  Patient refused deep palpation. MSK/EXT:   Right arm swelling.  Bruising just proximal to right elbow.  Anxious to move his right arm or bend his elbow.  Neurovascular intact. SKIN: no apparent skin lesion or wound NEURO: AA.  Oriented appropriately.  No apparent focal neuro deficit. PSYCH: Calm. Normal affect.   Consultants:  Gastroenterology Interventional radiology Infectious disease Nephrology  Procedures: 8/19, 8/26, 8/29 and 9/1-paracentesis  Microbiology summarized: 8/19-peritoneal fluid with Klebsiella pneumoniae 8/19-blood cultures NGTD 8/29-peritoneal fluid culture NGTD 9/1-peritoneal fluid culture NGTD  Assessment and plan: Spontaneous bacterial peritonitis-initial peritoneal fluid culture on 8/91 with Klebsiella pneumonia.  Patient treated with ceftriaxone  and antibiotic escalated to IV Ancef .  Subsequent fluid analysis with worsening neutrophilic counts despite negative cultures. QuantiFERON gold and peritoneal tests for crypto, blasto, histo, AFS and fungal culture are negative.  However, peritoneal fluid is positive for Fungitell.  Peritoneal fluid AFC pending. There was  also question if underlying lupus is contributing.   -Continue IV Ancef  until 01/11/2024 per ID to cover Klebsiella pneumonia -Follow AFC. -ID back on.   Recurrent ascites without diagnosis of liver cirrhosis-per patient sister, patient had extensive workup including liver biopsy about 3 years ago that was  negative for liver cirrhosis (not able to confirm from chart review).  Liver Doppler with patent portal vein and normal directional flow, cirrhotic liver morphology, splenomegaly and complex free fluid throughout the abdomen.  Anemia panel consistent with anemia of chronic disease.  He has significant hypoalbuminemia.  Not sure if underlying lupus is contributing. -S/p multiple paracentesis as above. -Follow IgG, ANA, ASMA, TTG IgA, A1A -GI on board.    Systemic lupus erythematosus with lung involvement -Per nephrology, lupus flare are less in HD patient but still possible. -On Plaquenil .  Prednisone  decreased from 20 to 15 mg by nephrology.   Acute metabolic encephalopathy: appeared very anxious, delirious, paranoid, even hallucinating on 8/22 Per sister - patient does drink to cover pain from his lupus, has reduced his alcohol consumption, reports drinking 1.5 pints of alcohol at a time periodically but denies daily drinking.  Resolved  Nausea, vomiting abdominal pain: Likely due to the above.  Improved. -Continue Tylenol  and oxycodone  as needed. -Liberated diet after discussion with nephrology given poor appetite -Antiemetics as needed. -GI on board.  Chronic pancytopenia: Due to SLE, immunosuppression, renal disease -SCDs for DVT prophylaxis    ESRD on HD TTS -Per nephrology   Chronic HFpEF: Appears euvolemic on exam except for ascites. - Volume management by dialysis  Hypertension: BP slightly elevated. -Continue carvedilol  -Ultrafiltration per nephrology   Hypothyroidism: TSH 7.9.  Started on Synthroid  50 mcg daily but I do not know the clinical significance of slightly elevated TSH in acute setting. -Decreased Synthroid  to 25 mcg daily on 9/5. -Repeat TFTs in 4-6 weeks  Hyponatremia: Likely due to ESRD.  Improved on repeat. -Management per nephrology.  Hyperkalemia: Mild -Correct with dialysis   GERD -Continue Protonix    Increased nutrient needs Body mass index is  18.33 kg/m. Nutrition Problem: Increased nutrient needs Etiology: chronic illness Signs/Symptoms: estimated needs Interventions: Refer to RD note for recommendations, Nepro shake, MVI   DVT prophylaxis:  SCDs Start: 12/14/23 1804  Code Status: Full code Family Communication: Updated patient's sister at the phone and cousin at bedside. Level of care: Med-Surg Status is: Inpatient Remains inpatient appropriate because: Spontaneous peritonitis, recurrent ascites   Final disposition: Home with home health once medically stable   55 minutes with more than 50% spent in reviewing records, counseling patient/family and coordinating care.   Sch Meds:  Scheduled Meds:  carvedilol   25 mg Oral BID   Chlorhexidine  Gluconate Cloth  6 each Topical Q0600   docusate sodium   100 mg Oral BID   doxercalciferol   2 mcg Intravenous Q T,Th,Sa-HD   feeding supplement (NEPRO CARB STEADY)  237 mL Oral Q24H   folic acid   1 mg Oral Daily   hydroxychloroquine   200 mg Oral BID   levothyroxine   25 mcg Oral QAC breakfast   multivitamin  1 tablet Oral QHS   pantoprazole   40 mg Oral Daily   polyethylene glycol  17 g Oral Daily   predniSONE   15 mg Oral Q breakfast   thiamine   100 mg Oral Daily   Continuous Infusions:   ceFAZolin  (ANCEF ) IV Stopped (12/30/23 1255)   PRN Meds:.acetaminophen  **OR** [DISCONTINUED] acetaminophen , albuterol , alteplase , alum & mag hydroxide-simeth, bisacodyl , bisacodyl , heparin , heparin , lidocaine  (PF),  lidocaine -prilocaine , melatonin, ondansetron  **OR** ondansetron  (ZOFRAN ) IV, mouth rinse, oxyCODONE , pentafluoroprop-tetrafluoroeth  Antimicrobials: Anti-infectives (From admission, onward)    Start     Dose/Rate Route Frequency Ordered Stop   12/30/23 1200  ceFAZolin  (ANCEF ) IVPB 2g/100 mL premix        2 g 200 mL/hr over 30 Minutes Intravenous Every T-Th-Sa (Hemodialysis) 12/28/23 1518 01/11/24 1159   12/30/23 0000  ceFAZolin  (ANCEF ) IVPB        2 g Intravenous Every  T-Th-Sa (Hemodialysis) 12/28/23 1522 01/08/24 2359   12/29/23 1200  ceFAZolin  (ANCEF ) IVPB 1 g/50 mL premix        1 g 100 mL/hr over 30 Minutes Intravenous Every 24 hours 12/28/23 1518 12/29/23 1344   12/22/23 1400  hydroxychloroquine  (PLAQUENIL ) tablet 200 mg        200 mg Oral 2 times daily 12/22/23 1252     12/22/23 1200  ceFAZolin  (ANCEF ) IVPB 1 g/50 mL premix  Status:  Discontinued        1 g 100 mL/hr over 30 Minutes Intravenous Every 24 hours 12/21/23 1039 12/28/23 1518   12/15/23 0800  cefTRIAXone  (ROCEPHIN ) 2 g in sodium chloride  0.9 % 100 mL IVPB  Status:  Discontinued        2 g 200 mL/hr over 30 Minutes Intravenous Every 24 hours 12/14/23 1740 12/21/23 1039   12/14/23 1745  metroNIDAZOLE  (FLAGYL ) IVPB 500 mg  Status:  Discontinued        500 mg 100 mL/hr over 60 Minutes Intravenous Every 12 hours 12/14/23 1740 12/17/23 1138   12/14/23 1545  cefTRIAXone  (ROCEPHIN ) 2 g in sodium chloride  0.9 % 100 mL IVPB        2 g 200 mL/hr over 30 Minutes Intravenous  Once 12/14/23 1534 12/14/23 1649        I have personally reviewed the following labs and images: CBC: Recent Labs  Lab 12/26/23 0727 12/27/23 0508 12/29/23 0423 12/30/23 1459 12/31/23 0947 01/01/24 0905  WBC 5.0 4.2 3.7* 7.3 8.4 7.3  NEUTROABS 4.6 3.2 2.9  --   --   --   HGB 9.6* 9.9* 9.0* 9.8* 9.1* 8.3*  HCT 30.7* 31.1* 27.7* 29.8* 27.7* 25.6*  MCV 88.7 86.4 86.3 86.4 87.1 85.6  PLT 103* 115* 104* 126* 124* 147*   BMP &GFR Recent Labs  Lab 12/27/23 0508 12/29/23 0423 12/30/23 1459 12/31/23 0947 01/01/24 0905 01/01/24 1237  NA 129* 128* 132* 131* 122* 127*  K 5.3* 4.5 4.2 5.0 5.2*  --   CL 92* 92* 94* 93* 84*  --   CO2 27 28 28  21* 23  --   GLUCOSE 90 91 93 69* 358*  --   BUN 49* 40* 27* 43* 55*  --   CREATININE 5.60* 5.00* 3.68* 5.22* 6.03*  --   CALCIUM  7.8* 7.1* 7.6* 7.7* 7.4*  --   MG  --   --  1.8 2.0 1.9  --   PHOS  --   --   --  6.3*  --   --    Estimated Creatinine Clearance: 11.8 mL/min  (A) (by C-G formula based on SCr of 6.03 mg/dL (H)). Liver & Pancreas: Recent Labs  Lab 12/26/23 0727 12/27/23 0508 12/29/23 0423 12/30/23 1459 12/31/23 0947 01/01/24 0905  AST 36 32 31 33  --  25  ALT 6 6 <5 <5  --  <5  ALKPHOS 92 83 75 78  --  59  BILITOT 0.4 0.7 0.5 0.8  --  0.8  PROT 6.2* 6.3* 5.9* 6.7  --  6.1*  ALBUMIN  1.5* 1.6* 1.6* 1.8* 1.7* 1.6*   No results for input(s): LIPASE, AMYLASE in the last 168 hours. No results for input(s): AMMONIA in the last 168 hours. Diabetic: No results for input(s): HGBA1C in the last 72 hours. No results for input(s): GLUCAP in the last 168 hours. Cardiac Enzymes: No results for input(s): CKTOTAL, CKMB, CKMBINDEX, TROPONINI in the last 168 hours. No results for input(s): PROBNP in the last 8760 hours. Coagulation Profile: Recent Labs  Lab 12/30/23 1459  INR 1.2    Thyroid  Function Tests: No results for input(s): TSH, T4TOTAL, FREET4, T3FREE, THYROIDAB in the last 72 hours. Lipid Profile: No results for input(s): CHOL, HDL, LDLCALC, TRIG, CHOLHDL, LDLDIRECT in the last 72 hours. Anemia Panel: Recent Labs    12/31/23 0947  FERRITIN 1,632*  TIBC 123*  IRON 26*   Urine analysis:    Component Value Date/Time   COLORURINE AMBER (A) 06/29/2021 0425   APPEARANCEUR HAZY (A) 06/29/2021 0425   LABSPEC 1.018 06/29/2021 0425   PHURINE 7.0 06/29/2021 0425   GLUCOSEU NEGATIVE 06/29/2021 0425   HGBUR SMALL (A) 06/29/2021 0425   BILIRUBINUR NEGATIVE 06/29/2021 0425   KETONESUR 5 (A) 06/29/2021 0425   PROTEINUR >=300 (A) 06/29/2021 0425   NITRITE NEGATIVE 06/29/2021 0425   LEUKOCYTESUR NEGATIVE 06/29/2021 0425   Sepsis Labs: Invalid input(s): PROCALCITONIN, LACTICIDVEN  Microbiology: Recent Results (from the past 240 hours)  Body fluid culture w Gram Stain     Status: None   Collection Time: 12/24/23 12:35 PM   Specimen: Abdomen; Peritoneal Fluid  Result Value Ref Range Status    Specimen Description PERITONEAL  Final   Special Requests NONE  Final   Gram Stain   Final    RARE WBC PRESENT,BOTH PMN AND MONONUCLEAR NO ORGANISMS SEEN    Culture   Final    NO GROWTH 3 DAYS Performed at [facility], [address], [place], [place] [address]    Report Status 12/27/2023 FINAL  Final  Acid Fast Smear (AFB)     Status: None   Collection Time: 12/26/23 12:16 PM   Specimen: Perineal; Other  Result Value Ref Range Status   AFB Specimen Processing Concentration  Final   Acid Fast Smear Negative  Final    Comment: (NOTE) Performed At: [facility] [address] [place], [place] [id number] Jennette Shorter MD Ey:[id number]    Source (AFB) PERITONEAL  Final    Comment: Performed at [facility], [address], [place], [place] [address]  MT-RIF NAA W Cult, Non-Sputum     Status: None (Preliminary result)   Collection Time: 12/26/23 12:17 PM   Specimen: Pleural Fluid  Result Value Ref Range Status   Source PERITONEAL  Final    Comment: Performed at [facility], [address], [place], [place] [address]   Specimen Source [facility]  Final    Comment: (NOTE) Test not performed. The required specimen for the test ordered was not received. Received: Peritoneal Fluid Requires: BAL, Pleural or CSF Notified Tomadur S. 12/30/2023    M tuberculosis complex WRORD  Final    Comment: (NOTE) Test not performed. The required specimen for the test ordered was not received. Received: Peritoneal Fluid Requires: BAL, Pleural or CSF Notified Tomadur S. 12/30/2023 This test was developed and its performance characteristics determined by [facility]. It has not been cleared or approved by the Food and Drug Administration.  Rifampin WRORD  Final    Comment: (NOTE) Test not performed. The required specimen for the test ordered was not received. Received: Peritoneal Fluid Requires: BAL, Pleural or CSF Notified Tomadur S. 12/30/2023 This test was  developed and its performance characteristics determined by [facility]. It has not been cleared or approved by the Food and Drug Administration.    AFB Specimen Processing Concentration  Final    Comment: (NOTE) Performed At: [facility] [address] [place], [place] [id number] Jennette Shorter MD Ey:[id number]    Acid Fast Culture PENDING  Incomplete  Body fluid culture w Gram Stain     Status: None   Collection Time: 12/27/23 10:49 AM   Specimen: Peritoneal Washings; Body Fluid  Result Value Ref Range Status   Specimen Description PERITONEAL  Final   Special Requests NONE  Final   Gram Stain   Final    MODERATE WBC PRESENT, PREDOMINANTLY PMN NO ORGANISMS SEEN    Culture   Final    NO GROWTH 3 DAYS Performed at [facility], [address], [place], [place] [address]    Report Status 12/30/2023 FINAL  Final  Culture, fungus without smear     Status: None (Preliminary result)   Collection Time: 12/27/23 10:49 AM   Specimen: Peritoneal Washings; Other  Result Value Ref Range Status   Specimen Description PERITONEAL  Final   Special Requests NONE  Final   Culture   Final    NO FUNGUS ISOLATED AFTER 5 DAYS Performed at [facility], [address], [place], [place] [address]    Report Status PENDING  Incomplete  Blastomyces Antigen     Status: None   Collection Time: 12/27/23  6:26 PM   Specimen: Blood  Result Value Ref Range Status   Blastomyces Antigen None Detected None Detected ng/mL Final    Comment: (NOTE) Reference Interval: None Detected Reportable Range: 0.31 ng/mL - 20.00 ng/mL Results above 20.00 ng/mL are reported as 'Positive, Above the Limit of Quantification' This test was developed and its performance characteristics determined by [employer]. It has not been cleared or approved by the FDA; however, FDA clearance or approval is not currently required for clinical use. The results are not intended to be used as the sole means  for clinical diagnosis or patient decisions.    Interpretation Negative  Final   Specimen Type SERUM  Final    Comment: (NOTE) Performed At: [facility] [address] [place], [place] [id number] Charleston Pac MD Ey:[id number]     Radiology Studies: DG Elbow 2 Views Right Result Date: 12/31/2023 EXAM: 1 VIEW(S) XRAY OF THE RIGHT ELBOW COMPARISON: None available. CLINICAL HISTORY: Right elbow pain. Reason for exam: Complaint of right elbow pain, bruising and swelling to posterior of elbow noticed; best obtainable pt could not move arm. FINDINGS: BONES AND JOINTS: No acute fracture. No focal osseous lesion. No joint dislocation. SOFT TISSUES: Swelling within the dorsal soft tissues of the distal arm. IMPRESSION: 1. No acute fracture or dislocation. 2. Swelling within the dorsal soft tissues of the distal arm. Electronically signed by: Norman Gatlin MD 12/31/2023 10:22 PM EDT RP Workstation: [id number]       Ujbz T. Morgyn Marut [facility]  If 7PM-7AM, please contact night-coverage [email or web address] 01/01/2024, 4:29 PM

## 2024-01-01 NOTE — Progress Notes (Addendum)
 [facility] Progress Note   Subjective:    Seen and examined patient at bedside. C/o mild ABD pain. Reports having a x-ray of right elbow completed. ID following. Plan for HD this afternoon.  Objective Vitals:   12/31/23 1526 12/31/23 2130 01/01/24 0446 01/01/24 0740  BP: (!) 168/86 (!) 198/78 (!) 168/56 (!) 158/83  Pulse: 78 73 68 69  Resp: 18 18 20 18   Temp: 98 F (36.7 C) 97.6 F (36.4 C) (!) 97.5 F (36.4 C) (!) 97 F (36.1 C)  TempSrc: Oral Oral Axillary Oral  SpO2: 98% 99% 99% 98%  Weight:   64.6 kg   Height:       Physical Exam General: Chronically ill appearing, NAD. Room air Heart: RRR; no murmur Lungs: CTA anteriorly Abdomen: distended, mild TTP Extremities: No LE edema Dialysis Access: LUE AVF +t/b   Filed Weights   12/30/23 0933 12/31/23 0712 01/01/24 0446  Weight: 62 kg 62.3 kg 64.6 kg    Intake/Output Summary (Last 24 hours) at 01/01/2024 1340 Last data filed at 12/31/2023 2200 Gross per 24 hour  Intake 493.33 ml  Output 0 ml  Net 493.33 ml    Additional Objective Labs: Basic Metabolic Panel: Recent Labs  Lab 12/30/23 1459 12/31/23 0947 01/01/24 0905  NA 132* 131* 122*  K 4.2 5.0 5.2*  CL 94* 93* 84*  CO2 28 21* 23  GLUCOSE 93 69* 358*  BUN 27* 43* 55*  CREATININE 3.68* 5.22* 6.03*  CALCIUM  7.6* 7.7* 7.4*  PHOS  --  6.3*  --    Liver Function Tests: Recent Labs  Lab 12/29/23 0423 12/30/23 1459 12/31/23 0947 01/01/24 0905  AST 31 33  --  25  ALT <5 <5  --  <5  ALKPHOS 75 78  --  59  BILITOT 0.5 0.8  --  0.8  PROT 5.9* 6.7  --  6.1*  ALBUMIN  1.6* 1.8* 1.7* 1.6*   No results for input(s): LIPASE, AMYLASE in the last 168 hours. CBC: Recent Labs  Lab 12/26/23 0727 12/27/23 0508 12/29/23 0423 12/30/23 1459 12/31/23 0947 01/01/24 0905  WBC 5.0 4.2 3.7* 7.3 8.4 7.3  NEUTROABS 4.6 3.2 2.9  --   --   --   HGB 9.6* 9.9* 9.0* 9.8* 9.1* 8.3*  HCT 30.7* 31.1* 27.7* 29.8* 27.7* 25.6*  MCV 88.7 86.4 86.3 86.4 87.1  85.6  PLT 103* 115* 104* 126* 124* 147*   Blood Culture    Component Value Date/Time   SDES PERITONEAL 12/27/2023 1049   SDES PERITONEAL 12/27/2023 1049   SPECREQUEST NONE 12/27/2023 1049   SPECREQUEST NONE 12/27/2023 1049   CULT  12/27/2023 1049    NO GROWTH 3 DAYS Performed at [facility], [address], [place], [place] [address]    CULT  12/27/2023 1049    NO FUNGUS ISOLATED AFTER 5 DAYS Performed at [facility], [address] [place], [place] [address]    REPTSTATUS 12/30/2023 FINAL 12/27/2023 1049   REPTSTATUS PENDING 12/27/2023 1049    Cardiac Enzymes: No results for input(s): CKTOTAL, CKMB, CKMBINDEX, TROPONINI in the last 168 hours. CBG: No results for input(s): GLUCAP in the last 168 hours. Iron Studies:  Recent Labs    12/31/23 0947  IRON 26*  TIBC 123*  FERRITIN 1,632*   Lab Results  Component Value Date   INR 1.2 12/30/2023   INR 1.2 12/23/2023   INR 1.1 06/28/2021   Studies/Results: DG Elbow 2 Views Right Result Date:  12/31/2023 EXAM: 1 VIEW(S) XRAY OF THE RIGHT ELBOW COMPARISON: None available. CLINICAL HISTORY: Right elbow pain. Reason for exam: Complaint of right elbow pain, bruising and swelling to posterior of elbow noticed; best obtainable pt could not move arm. FINDINGS: BONES AND JOINTS: No acute fracture. No focal osseous lesion. No joint dislocation. SOFT TISSUES: Swelling within the dorsal soft tissues of the distal arm. IMPRESSION: 1. No acute fracture or dislocation. 2. Swelling within the dorsal soft tissues of the distal arm. Electronically signed by: Norman Gatlin MD 12/31/2023 10:22 PM EDT RP Workstation: [id number]    Medications:   ceFAZolin  (ANCEF ) IV Stopped (12/30/23 1255)   dextrose  50 mL/hr at 01/01/24 1143    carvedilol   25 mg Oral BID   Chlorhexidine  Gluconate Cloth  6 each Topical Q0600   docusate sodium   100 mg Oral BID   doxercalciferol   2 mcg Intravenous Q T,Th,Sa-HD   feeding supplement (NEPRO  CARB STEADY)  237 mL Oral Q24H   folic acid   1 mg Oral Daily   hydroxychloroquine   200 mg Oral BID   levothyroxine   25 mcg Oral QAC breakfast   multivitamin  1 tablet Oral QHS   pantoprazole   40 mg Oral Daily   polyethylene glycol  17 g Oral Daily   predniSONE   15 mg Oral Q breakfast   thiamine   100 mg Oral Daily    Dialysis Orders: TTS - AF 3:45hr, 400/A1.5, EDW 71kg, 2K/2C bath. AVF, no heparin  - Mircera 150mcg IV q 2 weeks (last 8/12) - Hectoral 2mcg IV q HD  Assessment/Plan: SBP: Fluid Cx + Klebsiella, on course of Cefazolin . Symptoms much improved, btu repeat peritoneal cell counts climbing with very high LDH and negative bacterial Cx. ID involved -> fungal Cx negative so far, TB testing in process, malignancy as well as chylous ascites being considered. Will be finishing 3 week course of Cefazolin . Reviewed ID's note from today: considering peritoneal biopsy with samples sent for path, AFB, fungal, and routine culture. Antifungals not initiated at this time. AMS: Infection v. alcohol withdrawal, resolved. ESRD: Continue HD on TTS schedule - next HD this afternoon. K+ today is 5.2. Will re-check level in AM. HTN/volume: BP stable now - ARB and hydralazine  d/c'd - remains on BB, UF as tolerated.  Hyponatremia: Likely 2nd current hyperglycemia. He's not overloaded on exam. Repeat Na pre-HD is 127. Anemia of ESRD: Hgb 9 - resume ESA as outpatient. Secondary HPTH: CorrCa/Phos ok - resumed hectoral q HD, not getting binders here Nutrition: Alb very low, continue protein supps. SLE: C3/4 low, dsDNA high - c/w flare - on plaquenil  and prednisone  taper - feels better. Recurrent ascites/cirrhosis: Gets intermittent paracenteses since 2022.  Angel Piety, NP [facility] 01/01/2024,1:40 PM  LOS: 18 days

## 2024-01-01 NOTE — Progress Notes (Signed)
 ID brief note Notified that BDG is + at 109. I reviewed recent ID notes, imaging and labs.  Other fungal tests have resulted neg - Crypto, blasto, histo and fungal cx from fluid  9/1 is no growth to date. He is not on antifungals.   HE is on HD for ESRD which can decrease the specificity of BDG for invasive fungal infections.  His markedly elevated TNC count on 9/1 was mainly PMNs 93%. Cx is neg. AFB cx and fungal cx pending. CT shows cirrhosis, complex ascites, peritoneal thickening.  Rec  I will discuss with Dr fleeta Rothman and [place] but I suspect he needs peritoneal biopsy with samples sent for path, AFB, fungal and routine culture.  Will not initiate antifungals at this time.

## 2024-01-02 DIAGNOSIS — N186 End stage renal disease: Secondary | ICD-10-CM | POA: Diagnosis not present

## 2024-01-02 DIAGNOSIS — I5022 Chronic systolic (congestive) heart failure: Secondary | ICD-10-CM | POA: Diagnosis not present

## 2024-01-02 DIAGNOSIS — K652 Spontaneous bacterial peritonitis: Secondary | ICD-10-CM | POA: Diagnosis not present

## 2024-01-02 DIAGNOSIS — R188 Other ascites: Secondary | ICD-10-CM | POA: Diagnosis not present

## 2024-01-02 DIAGNOSIS — E039 Hypothyroidism, unspecified: Secondary | ICD-10-CM | POA: Diagnosis not present

## 2024-01-02 LAB — MAGNESIUM: Magnesium: 1.8 mg/dL (ref 1.7–2.4)

## 2024-01-02 LAB — ANTI-SMOOTH MUSCLE ANTIBODY, IGG: F-Actin IgG: 29 U — ABNORMAL HIGH (ref 0–19)

## 2024-01-02 LAB — CBC
HCT: 24.7 % — ABNORMAL LOW (ref 39.0–52.0)
Hemoglobin: 8.1 g/dL — ABNORMAL LOW (ref 13.0–17.0)
MCH: 28.5 pg (ref 26.0–34.0)
MCHC: 32.8 g/dL (ref 30.0–36.0)
MCV: 87 fL (ref 80.0–100.0)
Platelets: 117 K/uL — ABNORMAL LOW (ref 150–400)
RBC: 2.84 MIL/uL — ABNORMAL LOW (ref 4.22–5.81)
RDW: 16.7 % — ABNORMAL HIGH (ref 11.5–15.5)
WBC: 6 K/uL (ref 4.0–10.5)
nRBC: 0 % (ref 0.0–0.2)

## 2024-01-02 LAB — RENAL FUNCTION PANEL
Albumin: 1.6 g/dL — ABNORMAL LOW (ref 3.5–5.0)
Anion gap: 12 (ref 5–15)
BUN: 41 mg/dL — ABNORMAL HIGH (ref 6–20)
CO2: 25 mmol/L (ref 22–32)
Calcium: 7.7 mg/dL — ABNORMAL LOW (ref 8.9–10.3)
Chloride: 93 mmol/L — ABNORMAL LOW (ref 98–111)
Creatinine, Ser: 4.58 mg/dL — ABNORMAL HIGH (ref 0.61–1.24)
GFR, Estimated: 14 mL/min — ABNORMAL LOW (ref >60)
Glucose, Bld: 92 mg/dL (ref 70–99)
Phosphorus: 6 mg/dL — ABNORMAL HIGH (ref 2.5–4.6)
Potassium: 4.5 mmol/L (ref 3.5–5.1)
Sodium: 130 mmol/L — ABNORMAL LOW (ref 135–145)

## 2024-01-02 MED ORDER — DARBEPOETIN ALFA 100 MCG/0.5ML IJ SOSY
100.0000 ug | PREFILLED_SYRINGE | INTRAMUSCULAR | Status: DC
  Administered 2024-01-02: 100 ug via SUBCUTANEOUS
  Filled 2024-01-02: qty 0.5

## 2024-01-02 MED ORDER — HYDRALAZINE HCL 20 MG/ML IJ SOLN: 10.0000 mg | INTRAMUSCULAR | Status: DC | PRN

## 2024-01-02 MED ORDER — AMLODIPINE BESYLATE 10 MG PO TABS
10.0000 mg | ORAL_TABLET | Freq: Every day | ORAL | Status: DC
  Administered 2024-01-02: 10 mg via ORAL
  Filled 2024-01-02 (×2): qty 1

## 2024-01-02 NOTE — Progress Notes (Signed)
 ID brief note Pt remains weak, not eating much. Really wants to go home He is having some mild abd pain but much improved. Yesterday notified that BDG is + at 109. I reviewed recent ID notes, imaging and labs.  Other fungal tests have resulted neg - Crypto, blasto, histo and fungal cx from fluid  9/1 is no growth to date. He is not on antifungals.  He is on HD for ESRD which can decrease the specificity of BDG for invasive fungal infections. His markedly elevated TNC count on 9/1 was mainly PMNs 93%. Cx is neg. AFB cx and fungal cx pending. CT shows cirrhosis, complex ascites, peritoneal thickening.  Rec Discussed with pt and caregiver options. They do not really want aggressive wu such as peritoneal bxp. Discussed repeating para with cell counts to see if these are improving. Would sent repeat routine cx and fungal cultures as well Once done we discussed initiating oral fluconazole pending the fungal cultures already done He really would like to leave tomorrow and this can be done as he can get cefazolin  at HD.

## 2024-01-02 NOTE — Progress Notes (Addendum)
 [facility] Progress Note   Subjective:    Seen and examined patient at bedside. Patient expresses frustration and wants answers as to what's going on. ID w/u in progress. Noted yesterday's HD ended early 2nd machine clotting off. was removed. Next HD 9/9.  Objective Vitals:   01/01/24 1844 01/01/24 1930 01/02/24 0751 01/02/24 1132  BP: (!) 162/68 (!) 181/67  (!) 176/62  Pulse: 80 82  72  Resp: 16 18  18   Temp:  97.9 F (36.6 C)  98.2 F (36.8 C)  TempSrc:  Oral    SpO2: 100% 97%  100%  Weight:   58.9 kg   Height:       Physical Exam General: Chronically ill appearing, thin, NAD. Room air Heart: RRR; no murmur Lungs: CTA anteriorly Abdomen: distended, mild TTP Extremities: No LE edema Dialysis Access: LUE AVF +t/b   Filed Weights   01/01/24 1502 01/01/24 1810 01/02/24 0751  Weight: 59.6 kg 59 kg 58.9 kg    Intake/Output Summary (Last 24 hours) at 01/02/2024 1432 Last data filed at 01/02/2024 0600 Gross per 24 hour  Intake 340 ml  Output 700 ml  Net -360 ml    Additional Objective Labs: Basic Metabolic Panel: Recent Labs  Lab 12/31/23 0947 01/01/24 0905 01/01/24 1237 01/02/24 0821  NA 131* 122* 127* 130*  K 5.0 5.2*  --  4.5  CL 93* 84*  --  93*  CO2 21* 23  --  25  GLUCOSE 69* 358*  --  92  BUN 43* 55*  --  41*  CREATININE 5.22* 6.03*  --  4.58*  CALCIUM  7.7* 7.4*  --  7.7*  PHOS 6.3*  --   --  6.0*   Liver Function Tests: Recent Labs  Lab 12/29/23 0423 12/30/23 1459 12/31/23 0947 01/01/24 0905 01/02/24 0821  AST 31 33  --  25  --   ALT <5 <5  --  <5  --   ALKPHOS 75 78  --  59  --   BILITOT 0.5 0.8  --  0.8  --   PROT 5.9* 6.7  --  6.1*  --   ALBUMIN  1.6* 1.8* 1.7* 1.6* 1.6*   No results for input(s): LIPASE, AMYLASE in the last 168 hours. CBC: Recent Labs  Lab 12/27/23 0508 12/29/23 0423 12/30/23 1459 12/31/23 0947 01/01/24 0905 01/02/24 0821  WBC 4.2 3.7* 7.3 8.4 7.3 6.0  NEUTROABS 3.2 2.9  --   --   --    --   HGB 9.9* 9.0* 9.8* 9.1* 8.3* 8.1*  HCT 31.1* 27.7* 29.8* 27.7* 25.6* 24.7*  MCV 86.4 86.3 86.4 87.1 85.6 87.0  PLT 115* 104* 126* 124* 147* 117*   Blood Culture    Component Value Date/Time   SDES PERITONEAL 12/27/2023 1049   SDES PERITONEAL 12/27/2023 1049   SPECREQUEST NONE 12/27/2023 1049   SPECREQUEST NONE 12/27/2023 1049   CULT  12/27/2023 1049    NO GROWTH 3 DAYS Performed at [facility], [address], [place], [place] [address]    CULT  12/27/2023 1049    NO FUNGUS ISOLATED AFTER 6 DAYS Performed at [facility], [address] [place], [place] [address]    REPTSTATUS 12/30/2023 FINAL 12/27/2023 1049   REPTSTATUS PENDING 12/27/2023 1049    Cardiac Enzymes: No results for input(s): CKTOTAL, CKMB, CKMBINDEX, TROPONINI in the last 168 hours. CBG: No results for input(s): GLUCAP in the last 168 hours. Iron Studies:  Recent  Labs    12/31/23 0947  IRON 26*  TIBC 123*  FERRITIN 1,632*   Lab Results  Component Value Date   INR 1.2 12/30/2023   INR 1.2 12/23/2023   INR 1.1 06/28/2021   Studies/Results: DG Elbow 2 Views Right Result Date: 12/31/2023 EXAM: 1 VIEW(S) XRAY OF THE RIGHT ELBOW COMPARISON: None available. CLINICAL HISTORY: Right elbow pain. Reason for exam: Complaint of right elbow pain, bruising and swelling to posterior of elbow noticed; best obtainable pt could not move arm. FINDINGS: BONES AND JOINTS: No acute fracture. No focal osseous lesion. No joint dislocation. SOFT TISSUES: Swelling within the dorsal soft tissues of the distal arm. IMPRESSION: 1. No acute fracture or dislocation. 2. Swelling within the dorsal soft tissues of the distal arm. Electronically signed by: Norman Gatlin MD 12/31/2023 10:22 PM EDT RP Workstation: [id number]    Medications:   ceFAZolin  (ANCEF ) IV Stopped (01/01/24 1905)    amLODipine   10 mg Oral Daily   carvedilol   25 mg Oral BID   Chlorhexidine  Gluconate Cloth  6 each Topical Q0600    docusate sodium   100 mg Oral BID   doxercalciferol   2 mcg Intravenous Q T,Th,Sa-HD   feeding supplement (NEPRO CARB STEADY)  237 mL Oral Q24H   folic acid   1 mg Oral Daily   hydroxychloroquine   200 mg Oral BID   levothyroxine   25 mcg Oral QAC breakfast   multivitamin  1 tablet Oral QHS   pantoprazole   40 mg Oral Daily   polyethylene glycol  17 g Oral Daily   predniSONE   15 mg Oral Q breakfast   thiamine   100 mg Oral Daily    Dialysis Orders: TTS - AF 3:45hr, 400/A1.5, EDW 71kg, 2K/2C bath. AVF, no heparin  - Mircera 150mcg IV q 2 weeks (last 8/12) - Hectoral 2mcg IV q HD  Assessment/Plan: SBP: Fluid Cx + Klebsiella, on course of Cefazolin . Symptoms much improved, btu repeat peritoneal cell counts climbing with very high LDH and negative bacterial Cx. ID involved -> fungal Cx negative so far, TB testing in process, malignancy as well as chylous ascites being considered. Will be finishing 3 week course of Cefazolin . Reviewed ID's note from 9/6: considering peritoneal biopsy with samples sent for path, AFB, fungal, and routine culture. Antifungals not initiated at this time. AMS: Infection v. alcohol withdrawal, resolved. ESRD: Continue HD on TTS schedule - next HD 9/9. K+ now stable. Noted machine clotted off on Sat. Consider using tight heparin  with next HD. HTN/volume: BP stable now - ARB and hydralazine  d/c'd - remains on BB, UF as tolerated.  Hyponatremia: Likely 2nd current hyperglycemia. He's not overloaded on exam. Na now 130. Anemia of ESRD: Hgb 8.1 - resume ESA here. Secondary HPTH: CorrCa/Phos ok - resumed hectoral q HD, not getting binders here Nutrition: Alb very low, continue protein supps. SLE: C3/4 low, dsDNA high - c/w flare - on plaquenil  and prednisone  taper - feels better. Recurrent ascites/cirrhosis: Gets intermittent paracenteses since 2022.  Angel Piety, NP [facility] 01/02/2024,2:32 PM  LOS: 19 days

## 2024-01-02 NOTE — Progress Notes (Signed)
 PROGRESS NOTE  [name] FMW:[id number] DOB: 05-05-1970   PCP: Berneta Elsie Sayre, MD  Patient is from: Home.  DOA: 12/14/2023 LOS: 19  Chief complaints Chief Complaint  Patient presents with   Abdominal Pain     Brief Narrative / Interim history: 53yo with h/o ESRD on TTS HD, SLE, ascites without diagnosis of cirrhosis, and chronic HFrEF who presented on 8/19 with abdominal pain. Last paracentesis was in 09/2023.  Patient underwent paracentesis with removal of 1.8 L on 8/19.  Fluid analysis consistent with SBP.  Culture positive for Klebsiella.  Treated with Ceftriaxone  -> Cefazolin  x 14 days. Underwent repeat para on 8/26, 8/29 and 9/1 with removal of 700 cc, 2.2 L and 920 cc fluids respectively.  Despite antibiotics, subsequent fluid analysis with worsening neutrophil counts.  Recurrent ascites felt to be due to hypoalbuminemia but CT and liver Doppler suggesting cirrhotic changes.  There was also question if underlying lupus is contributing.  QuantiFERON gold and peritoneal tests for crypto, blasto, histo, AFS and fungal culture are negative.  However, peritoneal fluid is positive for Fungitell.  Peritoneal fluid AFC pending.  GI and ID following.  Discussion about need for peritoneal biopsy  for path, AFB, fungal and routine culture  Subjective: Seen and examined earlier this morning.  No major events overnight of this morning.  Continues to endorse nausea but no emesis.  Abdominal pain improved.  Frustrated about prolonged hospital stay and not getting clear answer for his SBP.  Objective: Vitals:   01/01/24 1844 01/01/24 1930 01/02/24 0751 01/02/24 1132  BP: (!) 162/68 (!) 181/67  (!) 176/62  Pulse: 80 82  72  Resp: 16 18  18   Temp:  97.9 F (36.6 C)  98.2 F (36.8 C)  TempSrc:  Oral    SpO2: 100% 97%  100%  Weight:   58.9 kg   Height:        Examination:  GENERAL: No apparent distress.  Appears frail. HEENT: MMM.  Vision and hearing grossly intact.  NECK:  Supple.  No apparent JVD.  RESP:  No IWOB.  Fair aeration bilaterally. CVS:  RRR. Heart sounds normal.  ABD/GI/GU: BS+.  Soft.  Some tenderness with palpation.  MSK/EXT:  Right arm swelling.  Bruising just proximal to right elbow.  Able to flex and extend some.  Neurovascular intact. SKIN: no apparent skin lesion or wound NEURO: AA.  Oriented appropriately.  No apparent focal neuro deficit. PSYCH: Calm. Normal affect.   Consultants:  Gastroenterology Interventional radiology Infectious disease Nephrology  Procedures: 8/20, 8/26, 8/29 and 9/1-therapeutic paracentesis  Microbiology summarized: 8/19-peritoneal fluid with Klebsiella pneumoniae 8/19-blood cultures NGTD 8/29-peritoneal fluid culture NGTD 9/1-peritoneal fluid culture NGTD  Assessment and plan: Spontaneous bacterial peritonitis-initial peritoneal fluid culture on 8/91 with Klebsiella pneumonia.  Patient treated with ceftriaxone  and antibiotic escalated to IV Ancef .  Subsequent fluid analysis with worsening neutrophilic counts despite negative cultures. QuantiFERON gold and peritoneal tests for crypto, blasto, histo, AFS and fungal culture are negative.  However, peritoneal fluid is positive for Fungitell.  Peritoneal fluid AFC pending. There was also question if underlying lupus is contributing.   -Continue IV Ancef  until 01/11/2024 per ID to cover Klebsiella pneumonia -Follow ID recommendation.  Reconsulted on 9/6.  Discussion about possible peritoneal biopsy -Follow AFC.   Recurrent ascites without diagnosis of liver cirrhosis-per patient sister, patient had extensive workup including liver biopsy about 3 years ago that was negative for liver cirrhosis (not able to confirm from chart review).  Liver  Doppler with patent portal vein and normal directional flow, cirrhotic liver morphology, splenomegaly and complex free fluid throughout the abdomen.  Anemia panel consistent with anemia of chronic disease.  He has significant  hypoalbuminemia.  Not sure if underlying lupus is contributing.  ANA positive. -S/p therapeutic paracentesis on 8/20, 8/26, 8/29 and 9/1 -GI did not feel there is need for repeat therapeutic paracentesis as of 9/5. -Follow IgG, ASMA, TTG IgA, A1A -Per GI, patient and family to decide with they want to for outpatient GI follow-up    Systemic lupus erythematosus with lung involvement -Per nephrology, lupus flare are less in HD patient but still possible. -On Plaquenil .  Prednisone  decreased from 20 to 15 mg by nephrology.   Acute metabolic encephalopathy: appeared very anxious, delirious, paranoid, even hallucinating on 8/22 Per sister - patient does drink to cover pain from his lupus, has reduced his alcohol consumption, reports drinking 1.5 pints of alcohol at a time periodically but denies daily drinking.  Resolved  Nausea, vomiting abdominal pain: Likely due to the above.  Improved. -Continue Tylenol  and oxycodone  as needed. -Liberated diet after discussion with nephrology given poor appetite -Antiemetics as needed. -GI on board.  Chronic pancytopenia: Due to SLE, immunosuppression, renal disease -SCDs for DVT prophylaxis    ESRD on HD TTS -Per nephrology   Chronic HFpEF: Appears euvolemic on exam except for ascites. - Volume management by dialysis  Hypertension: BP elevated. -Continue carvedilol  -Add amlodipine  10 mg daily -Ultrafiltration per nephrology   Hypothyroidism: TSH 7.9.  Started on Synthroid  50 mcg daily but I do not know the clinical significance of slightly elevated TSH in acute setting. -Decreased Synthroid  to 25 mcg daily on 9/5. -Repeat TFTs in 4-6 weeks  Hyponatremia: Likely due to ESRD.  Improved on repeat. -Management per nephrology.  Hyperkalemia: Resolved.   GERD -Continue Protonix    Increased nutrient needs Body mass index is 18.11 kg/m. Nutrition Problem: Increased nutrient needs Etiology: chronic illness Signs/Symptoms: estimated  needs Interventions: Refer to RD note for recommendations, Nepro shake, MVI   DVT prophylaxis:  SCDs Start: 12/14/23 1804  Code Status: Full code Family Communication: None at bedside. Level of care: Med-Surg Status is: Inpatient Remains inpatient appropriate because: Spontaneous peritonitis, recurrent ascites   Final disposition: Home with home health once medically stable and cleared by consultants   55 minutes with more than 50% spent in reviewing records, counseling patient/family and coordinating care.   Sch Meds:  Scheduled Meds:  amLODipine   10 mg Oral Daily   carvedilol   25 mg Oral BID   Chlorhexidine  Gluconate Cloth  6 each Topical Q0600   docusate sodium   100 mg Oral BID   doxercalciferol   2 mcg Intravenous Q T,Th,Sa-HD   feeding supplement (NEPRO CARB STEADY)  237 mL Oral Q24H   folic acid   1 mg Oral Daily   hydroxychloroquine   200 mg Oral BID   levothyroxine   25 mcg Oral QAC breakfast   multivitamin  1 tablet Oral QHS   pantoprazole   40 mg Oral Daily   polyethylene glycol  17 g Oral Daily   predniSONE   15 mg Oral Q breakfast   thiamine   100 mg Oral Daily   Continuous Infusions:   ceFAZolin  (ANCEF ) IV Stopped (01/01/24 1905)   PRN Meds:.acetaminophen  **OR** [DISCONTINUED] acetaminophen , albuterol , alum & mag hydroxide-simeth, bisacodyl , bisacodyl , melatonin, ondansetron  **OR** ondansetron  (ZOFRAN ) IV, mouth rinse, oxyCODONE   Antimicrobials: Anti-infectives (From admission, onward)    Start     Dose/Rate Route Frequency Ordered Stop  12/30/23 1200  ceFAZolin  (ANCEF ) IVPB 2g/100 mL premix        2 g 200 mL/hr over 30 Minutes Intravenous Every T-Th-Sa (Hemodialysis) 12/28/23 1518 01/11/24 1159   12/30/23 0000  ceFAZolin  (ANCEF ) IVPB        2 g Intravenous Every T-Th-Sa (Hemodialysis) 12/28/23 1522 01/08/24 2359   12/29/23 1200  ceFAZolin  (ANCEF ) IVPB 1 g/50 mL premix        1 g 100 mL/hr over 30 Minutes Intravenous Every 24 hours 12/28/23 1518 12/29/23  1344   12/22/23 1400  hydroxychloroquine  (PLAQUENIL ) tablet 200 mg        200 mg Oral 2 times daily 12/22/23 1252     12/22/23 1200  ceFAZolin  (ANCEF ) IVPB 1 g/50 mL premix  Status:  Discontinued        1 g 100 mL/hr over 30 Minutes Intravenous Every 24 hours 12/21/23 1039 12/28/23 1518   12/15/23 0800  cefTRIAXone  (ROCEPHIN ) 2 g in sodium chloride  0.9 % 100 mL IVPB  Status:  Discontinued        2 g 200 mL/hr over 30 Minutes Intravenous Every 24 hours 12/14/23 1740 12/21/23 1039   12/14/23 1745  metroNIDAZOLE  (FLAGYL ) IVPB 500 mg  Status:  Discontinued        500 mg 100 mL/hr over 60 Minutes Intravenous Every 12 hours 12/14/23 1740 12/17/23 1138   12/14/23 1545  cefTRIAXone  (ROCEPHIN ) 2 g in sodium chloride  0.9 % 100 mL IVPB        2 g 200 mL/hr over 30 Minutes Intravenous  Once 12/14/23 1534 12/14/23 1649        I have personally reviewed the following labs and images: CBC: Recent Labs  Lab 12/27/23 0508 12/29/23 0423 12/30/23 1459 12/31/23 0947 01/01/24 0905 01/02/24 0821  WBC 4.2 3.7* 7.3 8.4 7.3 6.0  NEUTROABS 3.2 2.9  --   --   --   --   HGB 9.9* 9.0* 9.8* 9.1* 8.3* 8.1*  HCT 31.1* 27.7* 29.8* 27.7* 25.6* 24.7*  MCV 86.4 86.3 86.4 87.1 85.6 87.0  PLT 115* 104* 126* 124* 147* 117*   BMP &GFR Recent Labs  Lab 12/29/23 0423 12/30/23 1459 12/31/23 0947 01/01/24 0905 01/01/24 1237 01/02/24 0821  NA 128* 132* 131* 122* 127* 130*  K 4.5 4.2 5.0 5.2*  --  4.5  CL 92* 94* 93* 84*  --  93*  CO2 28 28 21* 23  --  25  GLUCOSE 91 93 69* 358*  --  92  BUN 40* 27* 43* 55*  --  41*  CREATININE 5.00* 3.68* 5.22* 6.03*  --  4.58*  CALCIUM  7.1* 7.6* 7.7* 7.4*  --  7.7*  MG  --  1.8 2.0 1.9  --  1.8  PHOS  --   --  6.3*  --   --  6.0*   Estimated Creatinine Clearance: 15.4 mL/min (A) (by C-G formula based on SCr of 4.58 mg/dL (H)). Liver & Pancreas: Recent Labs  Lab 12/27/23 0508 12/29/23 0423 12/30/23 1459 12/31/23 0947 01/01/24 0905 01/02/24 0821  AST 32 31 33   --  25  --   ALT 6 <5 <5  --  <5  --   ALKPHOS 83 75 78  --  59  --   BILITOT 0.7 0.5 0.8  --  0.8  --   PROT 6.3* 5.9* 6.7  --  6.1*  --   ALBUMIN  1.6* 1.6* 1.8* 1.7* 1.6* 1.6*   No results for input(s):  LIPASE, AMYLASE in the last 168 hours. No results for input(s): AMMONIA in the last 168 hours. Diabetic: No results for input(s): HGBA1C in the last 72 hours. No results for input(s): GLUCAP in the last 168 hours. Cardiac Enzymes: No results for input(s): CKTOTAL, CKMB, CKMBINDEX, TROPONINI in the last 168 hours. No results for input(s): PROBNP in the last 8760 hours. Coagulation Profile: Recent Labs  Lab 12/30/23 1459  INR 1.2    Thyroid  Function Tests: No results for input(s): TSH, T4TOTAL, FREET4, T3FREE, THYROIDAB in the last 72 hours. Lipid Profile: No results for input(s): CHOL, HDL, LDLCALC, TRIG, CHOLHDL, LDLDIRECT in the last 72 hours. Anemia Panel: Recent Labs    12/31/23 0947  FERRITIN 1,632*  TIBC 123*  IRON 26*   Urine analysis:    Component Value Date/Time   COLORURINE AMBER (A) 06/29/2021 0425   APPEARANCEUR HAZY (A) 06/29/2021 0425   LABSPEC 1.018 06/29/2021 0425   PHURINE 7.0 06/29/2021 0425   GLUCOSEU NEGATIVE 06/29/2021 0425   HGBUR SMALL (A) 06/29/2021 0425   BILIRUBINUR NEGATIVE 06/29/2021 0425   KETONESUR 5 (A) 06/29/2021 0425   PROTEINUR >=300 (A) 06/29/2021 0425   NITRITE NEGATIVE 06/29/2021 0425   LEUKOCYTESUR NEGATIVE 06/29/2021 0425   Sepsis Labs: Invalid input(s): PROCALCITONIN, LACTICIDVEN  Microbiology: Recent Results (from the past 240 hours)  Body fluid culture w Gram Stain     Status: None   Collection Time: 12/24/23 12:35 PM   Specimen: Abdomen; Peritoneal Fluid  Result Value Ref Range Status   Specimen Description PERITONEAL  Final   Special Requests NONE  Final   Gram Stain   Final    RARE WBC PRESENT,BOTH PMN AND MONONUCLEAR NO ORGANISMS SEEN    Culture   Final    NO  GROWTH 3 DAYS Performed at [facility], [address] [place], [place] [address]    Report Status 12/27/2023 FINAL  Final  Acid Fast Smear (AFB)     Status: None   Collection Time: 12/26/23 12:16 PM   Specimen: Perineal; Other  Result Value Ref Range Status   AFB Specimen Processing Concentration  Final   Acid Fast Smear Negative  Final    Comment: (NOTE) Performed At: [facility] [address] [place], [place] [id number] Jennette Shorter MD Ey:[id number]    Source (AFB) PERITONEAL  Final    Comment: Performed at [facility], [address], [place], [place] [address]  MT-RIF NAA W Cult, Non-Sputum     Status: None (Preliminary result)   Collection Time: 12/26/23 12:17 PM   Specimen: Pleural Fluid  Result Value Ref Range Status   Source PERITONEAL  Final    Comment: Performed at [facility], [address] [place], [place] [address]   Specimen Source [facility]  Final    Comment: (NOTE) Test not performed. The required specimen for the test ordered was not received. Received: Peritoneal Fluid Requires: BAL, Pleural or CSF Notified Tomadur S. 12/30/2023    M tuberculosis complex WRORD  Final    Comment: (NOTE) Test not performed. The required specimen for the test ordered was not received. Received: Peritoneal Fluid Requires: BAL, Pleural or CSF Notified Tomadur S. 12/30/2023 This test was developed and its performance characteristics determined by [facility]. It has not been cleared or approved by the Food and Drug Administration.    Rifampin WRORD  Final    Comment: (NOTE) Test not performed. The required specimen for the test ordered was not received. Received:  Peritoneal Fluid Requires: BAL, Pleural or CSF Notified Tomadur S. 12/30/2023 This test was developed and its performance characteristics determined by [facility]. It has not been cleared or approved by the Food and Drug Administration.    AFB Specimen Processing Concentration  Final     Comment: (NOTE) Performed At: [facility] [address] [place], [place] [id number] Jennette Shorter MD Ey:[id number]    Acid Fast Culture PENDING  Incomplete  Body fluid culture w Gram Stain     Status: None   Collection Time: 12/27/23 10:49 AM   Specimen: Peritoneal Washings; Body Fluid  Result Value Ref Range Status   Specimen Description PERITONEAL  Final   Special Requests NONE  Final   Gram Stain   Final    MODERATE WBC PRESENT, PREDOMINANTLY PMN NO ORGANISMS SEEN    Culture   Final    NO GROWTH 3 DAYS Performed at [facility], [address], [place], [place] [address]    Report Status 12/30/2023 FINAL  Final  Culture, fungus without smear     Status: None (Preliminary result)   Collection Time: 12/27/23 10:49 AM   Specimen: Peritoneal Washings; Other  Result Value Ref Range Status   Specimen Description PERITONEAL  Final   Special Requests NONE  Final   Culture   Final    NO FUNGUS ISOLATED AFTER 5 DAYS Performed at [facility], [address], [place], [place] [address]    Report Status PENDING  Incomplete  Blastomyces Antigen     Status: None   Collection Time: 12/27/23  6:26 PM   Specimen: Blood  Result Value Ref Range Status   Blastomyces Antigen None Detected None Detected ng/mL Final    Comment: (NOTE) Reference Interval: None Detected Reportable Range: 0.31 ng/mL - 20.00 ng/mL Results above 20.00 ng/mL are reported as 'Positive, Above the Limit of Quantification' This test was developed and its performance characteristics determined by [employer]. It has not been cleared or approved by the FDA; however, FDA clearance or approval is not currently required for clinical use. The results are not intended to be used as the sole means for clinical diagnosis or patient decisions.    Interpretation Negative  Final   Specimen Type SERUM  Final    Comment: (NOTE) Performed At: [facility] [address]  [place], [place] [id number] Charleston Pac MD Ey:[id number]     Radiology Studies: No results found.      Anoop Hemmer T. Emilliano Dilworth [facility]  If 7PM-7AM, please contact night-coverage [email or web address] 01/02/2024, 1:56 PM

## 2024-01-02 NOTE — Progress Notes (Signed)
 [facility] night cross cover note:   I was notified by the patient's RN that the patient is refusing his evening scheduled medications, including his scheduled coreg .  Most recent systolic blood pressures noted to be in the 180s mmHg, with heart rates in the 70s to 80s. per my discussions with the patient's RN, he is noted to be alert and oriented x 4. Prn iv hydralazine  for elevated BP added.    Eva Pore, DO Hospitalist

## 2024-01-02 NOTE — Plan of Care (Signed)
  Problem: Health Behavior/Discharge Planning: Goal: Ability to manage health-related needs will improve Outcome: Progressing   Problem: Clinical Measurements: Goal: Ability to maintain clinical measurements within normal limits will improve Outcome: Progressing Goal: Will remain free from infection Outcome: Progressing Goal: Respiratory complications will improve Outcome: Progressing Goal: Cardiovascular complication will be avoided Outcome: Progressing   Problem: Activity: Goal: Risk for activity intolerance will decrease Outcome: Progressing   Problem: Nutrition: Goal: Adequate nutrition will be maintained Outcome: Progressing   Problem: Coping: Goal: Level of anxiety will decrease Outcome: Progressing   Problem: Elimination: Goal: Will not experience complications related to bowel motility Outcome: Progressing Goal: Will not experience complications related to urinary retention Outcome: Progressing   Problem: Pain Managment: Goal: General experience of comfort will improve and/or be controlled Outcome: Progressing   Problem: Safety: Goal: Ability to remain free from injury will improve Outcome: Progressing   Problem: Skin Integrity: Goal: Risk for impaired skin integrity will decrease Outcome: Progressing   Problem: Safety: Goal: Non-violent Restraint(s) Outcome: Progressing   Problem: Safety: Goal: Non-violent Restraint(s) Outcome: Progressing   Problem: Fluid Volume: Goal: Compliance with measures to maintain balanced fluid volume will improve Outcome: Progressing   Problem: Health Behavior/Discharge Planning: Goal: Ability to manage health-related needs will improve Outcome: Progressing   Problem: Nutritional: Goal: Ability to make healthy dietary choices will improve Outcome: Progressing   Problem: Clinical Measurements: Goal: Complications related to the disease process, condition or treatment will be avoided or minimized Outcome:  Progressing

## 2024-01-02 NOTE — Progress Notes (Addendum)
 Inpatient Progress Note     Patient Profile/Chief Complaint  53 y.o. male with history of end-stage renal disease on hemodialysis, lupus, congestive heart failure with reduced EF35 to 40%, chronic ascites without definitive cirrhosis felt to be secondary to renal failure and hypoalbuminemia admitted with generalized acute abdominal pain and rapid accumulation of ascites with cultures positive for Klebsiella consistent with acute bacterial peritonitis.  Treated with cephalosporin.  Repeat paracentesis 12/27/23 showed significantly elevated cell count 57,750, 93% neutrophils; fluid with a milky consistency.  Being evaluated by infectious disease for fungal etiologies as well as TB.  Fungitell Beta-D-Gluan + 12/27/2023  Liver Doppler 12/29/2023 showed cirrhotic liver morphology, splenomegaly, fluid throughout the abdomen with normal Doppler flows   Interval History   -- Abdominal pain is improving per patient -- States that he has nausea but no longer vomiting -- Expresses frustration regarding prolonged hospitalization   Objective   Vital signs in last 24 hours: Temp:  [97.7 F (36.5 C)-98.2 F (36.8 C)] 98.2 F (36.8 C) (09/07 1132) Pulse Rate:  [66-82] 72 (09/07 1132) Resp:  [14-21] 18 (09/07 1132) BP: (140-181)/(62-75) 176/62 (09/07 1132) SpO2:  [96 %-100 %] 100 % (09/07 1132) Weight:  [58.9 kg-59.6 kg] 58.9 kg (09/07 0751) Last BM Date : 12/28/23 General:    Chronically ill-appearing gentleman resting quietly in bed Heart:  Regular rate and rhythm; no murmurs Lungs: Respirations even and unlabored, lungs CTA bilaterally Abdomen:  Soft, mildly distended, minimal tenderness to palpation normal bowel sounds. Extremities: Trace bilateral lower extremity edema Neurologic:  Alert and oriented,  grossly normal neurologically. Psych:  Cooperative. Normal mood and affect.  Intake/Output from previous day: 09/06 0701 - 09/07 0700 In: 340 [P.O.:240; IV Piggyback:100] Out: 700   Intake/Output this shift: No intake/output data recorded.  Lab Results: Recent Labs    12/31/23 0947 01/01/24 0905 01/02/24 0821  WBC 8.4 7.3 6.0  HGB 9.1* 8.3* 8.1*  HCT 27.7* 25.6* 24.7*  PLT 124* 147* 117*   BMET Recent Labs    12/31/23 0947 01/01/24 0905 01/01/24 1237 01/02/24 0821  NA 131* 122* 127* 130*  K 5.0 5.2*  --  4.5  CL 93* 84*  --  93*  CO2 21* 23  --  25  GLUCOSE 69* 358*  --  92  BUN 43* 55*  --  41*  CREATININE 5.22* 6.03*  --  4.58*  CALCIUM  7.7* 7.4*  --  7.7*   LFT Recent Labs    01/01/24 0905 01/02/24 0821  PROT 6.1*  --   ALBUMIN  1.6* 1.6*  AST 25  --   ALT <5  --   ALKPHOS 59  --   BILITOT 0.8  --    PT/INR No results for input(s): LABPROT, INR in the last 72 hours.  Paracentesis 12/27/2023 Moderate WBC present, predominantly PMN Culture negative thus far Cryptococcal antigen negative  Fungitell Beta-D-Gluan + 12/27/2023  Studies/Results: CTAP 12/14/2023 1. Moderate to large volume ascites, similar in overall volume to the previous CT. There is new diffuse smooth peritoneal thickening without apparent nodularity, suspicious for peritonitis. Consider diagnostic paracentesis. 2. Mild small bowel and colonic wall thickening, nonspecific in the setting of ascites. No evidence of bowel obstruction or perforation. The appendix is not discretely visualized, although there are no specific signs of appendicitis. 3. Progressive renal cortical thinning and atrophy bilaterally. No hydronephrosis or urinary tract calculus. 4. Chronic femoral head osteonecrosis bilaterally without subchondral collapse or significant secondary degenerative changes. 5.  Aortic Atherosclerosis (  ICD10-I70.0).  CTAP 12/15/2023 1. Moderate volume ascites. Increasing peritoneal thickening and enhancement about ascites particularly in the right abdomen and pelvis. Findings are suspicious for peritonitis. Generalized edema and soft tissue thickening of the  omentum, nonspecific in the setting of ascites. 2. Ascites with enhancement extends into a right inguinal hernia and into the scrotum. 3. Areas of colonic and small bowel wall thickening, nonspecific in the setting of ascites. 4. Wall thickening of the distal esophagus, can be seen with reflux or esophagitis. 5. Cardiomegaly with coronary artery calcifications. 6. Dilated main pulmonary artery, can be seen with pulmonary arterial hypertension. 7. Trace left pleural effusion. 8. Question of subtle capsular nodularity of the liver, can be seen with cirrhosis. Mild splenomegaly and tortuous splenic vein likely portal hypertension.  AUS with Doppler flows 12/29/2023 1. Patent portal vein with normal direction of flow. 2. Cirrhotic liver morphology. 3. Splenomegaly. 4. Complex free fluid throughout the abdomen.  Endoscopic Studies: None   Clinical Impression   53 y.o. male with history of end-stage renal disease on hemodialysis, lupus, congestive heart failure with reduced EF/35 to 40%, chronic ascites without definitive cirrhosis felt to be secondary to renal failure and hypoalbuminemia admitted with generalized acute abdominal pain and rapid accumulation of ascites with cultures positive for Klebsiella consistent with acute bacterial peritonitis.  Treated with cephalosporin.  Repeat paracentesis 12/27/23 showed significantly elevated cell count 57,750, 93% neutrophils; fluid with a milky consistency.  Being evaluated by infectious disease for fungal etiologies as well as TB.  Malignancy remains a consideration.  CT imaging 12/25/2023 shows question of subcapsular nodularity of the liver that can potentially be seen in the setting of cirrhosis, mild splenomegaly and tortuous splenic vein potentially related to portal hypertension which appear to be new findings.  These findings were incongruent with CT scan from 12/14/2023 which did not show any liver abnormalities. Previous SAAG was low at 0.8  suggesting absence of portal hypertension.  Possible previous history of alcohol consumption.  Fib 4 elevated.  Abdominal ultrasound with Doppler flows obtained 12/29/2023 which did confirm cirrhotic liver morphology, splenomegaly but otherwise normal portal flows.  Laboratory studies for chronic hepatitides performed.  In speaking with Prentis today he denies a history of liver disease.  He is vague regarding alcohol consumption.  Viral hepatitides have been ruled out by labs.  Other diagnostic considerations include autoimmune liver disease, hemochromatosis, celiac disease, cardiac cirrhosis given his underlying heart issues.  I had a lengthy discussion with his sister on the telephone 12/31/23.  She reports that he underwent evaluation for cirrhosis 3 years ago and was not found to have any form of liver disease or definitive evidence of cirrhosis.  We reviewed his recent CT and ultrasound findings.  She expresses that they do not wish to pursue any further evaluation or workup for liver disease.  His sister reports that she requested GI consultation for evaluation of hernia.  Advised that the surgical team evaluates hernias and not GI.  Also answered her questions regarding SBP and bowel habits contributing to abdominal discomfort.   Plan  Follow-up serologic evaluation for chronic hepatitides: ASMA, ANA, IgG, TTG IgA, IgA, iron panel, alpha 1 antitrypsin phenotype. Follow-up cultures from most recent paracentesis 12/27/2023 Continue cefazolin  x 3 weeks for Klebsiella SBP; ID guiding microbial management. Continue antiemetics and supportive management for abdominal pain, nausea and vomiting Patient's sister informed me that they do not wish to pursue any further workup or evaluation for a potential diagnosis of cirrhosis.  I will  leave the decision of whether or not they would like to follow-up in our office to [place] and his sister.  If they do wish to follow-up we can coordinate a future appointment and  discuss additional workup with elastography and EGD for esophageal varices screening.  Dr. Shila and Greig Corti, PA will be assuming rounding responsibilities for [facility] 01/03/2024    LOS: 19 days   [name]  01/02/2024, 3:01 PM  [name], MD [facility]

## 2024-01-03 ENCOUNTER — Inpatient Hospital Stay (HOSPITAL_COMMUNITY)

## 2024-01-03 DIAGNOSIS — I5022 Chronic systolic (congestive) heart failure: Secondary | ICD-10-CM | POA: Diagnosis not present

## 2024-01-03 DIAGNOSIS — E039 Hypothyroidism, unspecified: Secondary | ICD-10-CM | POA: Diagnosis not present

## 2024-01-03 DIAGNOSIS — N186 End stage renal disease: Secondary | ICD-10-CM | POA: Diagnosis not present

## 2024-01-03 DIAGNOSIS — K652 Spontaneous bacterial peritonitis: Secondary | ICD-10-CM | POA: Diagnosis not present

## 2024-01-03 LAB — GLIA (IGA/G) + TTG IGA
Antigliadin Abs, IgA: 3 U (ref 0–19)
Gliadin IgG: 2 U (ref 0–19)
Tissue Transglutaminase Ab, IgA: <2 U/mL (ref 0–3)

## 2024-01-03 MED ORDER — RENA-VITE PO TABS: 1.0000 | ORAL_TABLET | Freq: Every day | ORAL | 0 refills | Status: DC

## 2024-01-03 MED ORDER — PREDNISONE 10 MG PO TABS: 10.0000 mg | ORAL_TABLET | Freq: Every day | ORAL | Status: DC

## 2024-01-03 MED ORDER — ONDANSETRON 4 MG PO TBDP: 4.0000 mg | ORAL_TABLET | Freq: Three times a day (TID) | ORAL | 0 refills | Status: DC | PRN

## 2024-01-03 MED ORDER — PANTOPRAZOLE SODIUM 40 MG PO TBEC: 40.0000 mg | DELAYED_RELEASE_TABLET | Freq: Every day | ORAL | 0 refills | Status: DC

## 2024-01-03 MED ORDER — CARVEDILOL 25 MG PO TABS: 25.0000 mg | ORAL_TABLET | Freq: Two times a day (BID) | ORAL | 0 refills | Status: DC

## 2024-01-03 MED ORDER — LEVOTHYROXINE SODIUM 25 MCG PO TABS: 25.0000 ug | ORAL_TABLET | Freq: Every day | ORAL | 0 refills | Status: DC

## 2024-01-03 MED ORDER — HYDROXYCHLOROQUINE SULFATE 200 MG PO TABS
200.0000 mg | ORAL_TABLET | Freq: Two times a day (BID) | ORAL | 0 refills | Status: DC
Start: 2024-01-03 — End: 2024-01-03

## 2024-01-03 MED ORDER — POLYETHYLENE GLYCOL 3350 17 GM/SCOOP PO POWD: 17.0000 g | Freq: Two times a day (BID) | ORAL | 2 refills | Status: DC | PRN

## 2024-01-03 MED ORDER — LIDOCAINE-EPINEPHRINE 1 %-1:100000 IJ SOLN
INTRAMUSCULAR | Status: AC
  Filled 2024-01-03: qty 1

## 2024-01-03 MED ORDER — HYDROXYCHLOROQUINE SULFATE 200 MG PO TABS: 200.0000 mg | ORAL_TABLET | Freq: Every day | ORAL | 0 refills | Status: DC

## 2024-01-03 MED ORDER — PREDNISONE 10 MG PO TABS: 10.0000 mg | ORAL_TABLET | Freq: Every day | ORAL | 0 refills | Status: DC

## 2024-01-03 MED ORDER — LABETALOL HCL 5 MG/ML IV SOLN: 10.0000 mg | INTRAVENOUS | Status: DC | PRN

## 2024-01-03 MED ORDER — VITAMIN B-1 100 MG PO TABS
100.0000 mg | ORAL_TABLET | Freq: Every day | ORAL | 0 refills | Status: DC
Start: 2024-01-04 — End: 2024-01-31

## 2024-01-03 MED ORDER — FOLIC ACID 1 MG PO TABS: 1.0000 mg | ORAL_TABLET | Freq: Every day | ORAL | 0 refills | Status: DC

## 2024-01-03 MED ORDER — AMLODIPINE BESYLATE 10 MG PO TABS: 10.0000 mg | ORAL_TABLET | Freq: Every day | ORAL | 0 refills | Status: DC

## 2024-01-03 NOTE — Progress Notes (Signed)
 DISCHARGE NOTE HOME Thor Nannini Waheed to be discharged Home per MD order. Discussed prescriptions and follow up appointments with the patient. Prescriptions given to patient; medication list explained in detail. Patient verbalized understanding.  Skin clean, dry and intact without evidence of skin break down, no evidence of skin tears noted. IV catheter discontinued intact. Site without signs and symptoms of complications. Dressing and pressure applied. Pt denies pain at the site currently. No complaints noted.  Patient free of lines, drains, and wounds.   An After Visit Summary (AVS) was printed and given to the patient. Patient escorted via wheelchair, and discharged home via private auto.  Doyal Sias, RN

## 2024-01-03 NOTE — Plan of Care (Signed)
  Problem: Health Behavior/Discharge Planning: Goal: Ability to manage health-related needs will improve Outcome: Progressing   Problem: Clinical Measurements: Goal: Ability to maintain clinical measurements within normal limits will improve Outcome: Progressing Goal: Will remain free from infection Outcome: Progressing Goal: Respiratory complications will improve Outcome: Progressing Goal: Cardiovascular complication will be avoided Outcome: Progressing   Problem: Activity: Goal: Risk for activity intolerance will decrease Outcome: Progressing   Problem: Nutrition: Goal: Adequate nutrition will be maintained Outcome: Progressing   Problem: Coping: Goal: Level of anxiety will decrease Outcome: Progressing   Problem: Elimination: Goal: Will not experience complications related to bowel motility Outcome: Progressing Goal: Will not experience complications related to urinary retention Outcome: Progressing   Problem: Pain Managment: Goal: General experience of comfort will improve and/or be controlled Outcome: Progressing   Problem: Safety: Goal: Ability to remain free from injury will improve Outcome: Progressing   Problem: Skin Integrity: Goal: Risk for impaired skin integrity will decrease Outcome: Progressing   Problem: Safety: Goal: Non-violent Restraint(s) Outcome: Progressing   Problem: Safety: Goal: Non-violent Restraint(s) Outcome: Progressing   Problem: Fluid Volume: Goal: Compliance with measures to maintain balanced fluid volume will improve Outcome: Progressing   Problem: Health Behavior/Discharge Planning: Goal: Ability to manage health-related needs will improve Outcome: Progressing   Problem: Nutritional: Goal: Ability to make healthy dietary choices will improve Outcome: Progressing   Problem: Clinical Measurements: Goal: Complications related to the disease process, condition or treatment will be avoided or minimized Outcome:  Progressing

## 2024-01-03 NOTE — Discharge Summary (Signed)
 Physician Discharge Summary  [name] FMW:[id number] DOB: [birth year] DOA: 12/14/2023  PCP: Berneta Elsie Sayre, MD  Admit date: 12/14/2023 Discharge date: 01/03/24  Admitted From: Home Disposition: Home Recommendations for Outpatient Follow-up:  Outpatient follow-up with PCP, GI, infectious disease and rheumatology Check CMP and CBC at follow-up Please follow up on the following pending results: Peritoneal fluid AFC  Home Health: [facility] PT/OT Equipment/Devices: No need identified  Discharge Condition: Stable CODE STATUS: Full code Diet Orders (From admission, onward)     Start     Ordered   01/03/24 0940  Diet 2 gram sodium Room service appropriate? Yes; Fluid consistency: Thin; Fluid restriction: 1200 mL Fluid  Diet effective now       Question Answer Comment  Room service appropriate? Yes   Fluid consistency: Thin   Fluid restriction: 1200 mL Fluid      01/03/24 9060             Follow-up Information     Berneta Elsie Sayre, MD. Schedule an appointment as soon as possible for a visit in 1 week(s).   Specialty: Family Medicine Contact information: [address] [place] [address] [phone]                 Hospital course 53yo with h/o ESRD on TTS HD, SLE, ascites without diagnosis of cirrhosis, and chronic HFrEF who presented on 8/19 with abdominal pain. Last paracentesis was in 09/2023.  Patient underwent paracentesis with removal of 1.8 L on 8/19.  Fluid analysis consistent with SBP.  Culture positive for Klebsiella.  Treated with Ceftriaxone  -> Cefazolin  x 14 days. Underwent repeat para on 8/26, 8/29 and 9/1 with removal of 700 cc, 2.2 L and 920 cc fluids respectively.  Despite antibiotics, subsequent fluid analysis with worsening neutrophil counts.  Recurrent ascites felt to be due to hypoalbuminemia but CT and liver Doppler suggesting cirrhotic changes. He has elevated anti-dsDNA with low complement levels which raise concern for  lupus peritonitis as well.  He was started on prednisone  and Plaquenil  by nephrology.  Repeat double-stranded DNA improved.  Prednisone  decreased.  QuantiFERON gold and peritoneal tests for crypto, blasto, histo, AFS and fungal culture are negative.  However, peritoneal fluid is positive for Fungitell which is difficult to interpret in the setting of ESRD.  Infectious disease reengaged for positive Fungitell, and recommended peritoneal biopsy but patient and family not interested.  However, they agreed to repeat paracentesis with fluid studies.  Paracentesis ordered but limited ultrasound of abdomen to evaluate for ascites with minimal peritoneal fluid with numerous mobile loops of bowel.  On the day of discharge, patient remained stable and expressed his wish to go home.  He is cleared for discharge by involve exports including infectious disease, GI and nephrology.  ID recommendation as above.  Patient and family to decide about which GI team they will follow-up with outpatient.  Patient will continue IV Ancef  with hemodialysis until 9/16.  Nephrology recommended reducing prednisone  to 10 mg daily and outpatient follow-up with rheumatology.  He will continue Plaquenil  200 mg daily outpatient.  Encouraged to follow-up with rheumatology as soon as possible.  See individual problem list below for more.   Problems addressed during this hospitalization Spontaneous bacterial peritonitis-initial peritoneal fluid culture on 8/91 with Klebsiella pneumonia.  Patient treated with ceftriaxone  and antibiotic escalated to IV Ancef .  Subsequent fluid analysis with worsening neutrophilic counts despite negative cultures.  Question about lupus peritonitis given elevated dsDNA antibody (212 on 8/26 but  improved to 134 on 9/5).  Patient was started on prednisone  and Plaquenil .  QuantiFERON gold and peritoneal tests for crypto, blasto, histo, AFS and fungal culture are negative.  However, peritoneal fluid is positive for  Fungitell.    -Continue IV Ancef  until 01/11/2024 per ID to cover Klebsiella pneumonia -Follow peritoneal fluid AFC. -Outpatient follow-up with primary hematologist as soon as possible.   Recurrent ascites without diagnosis of liver cirrhosis-per patient sister, patient had extensive workup including liver biopsy about 3 years ago that was negative for liver cirrhosis (not able to confirm from chart review).  Liver Doppler with patent portal vein and normal directional flow, cirrhotic liver morphology, splenomegaly and complex free fluid throughout the abdomen.  Anemia panel consistent with anemia of chronic disease.  He has significant hypoalbuminemia.  Not sure if underlying lupus is contributing.  ANA positive. -S/p therapeutic paracentesis on 8/20, 8/26, 8/29 and 9/1 -GI did not feel there is need for repeat therapeutic paracentesis as of 9/5. -Follow IgG, ASMA, TTG IgA, A1A -Per GI, patient and family to decide with they want to for outpatient GI follow-up    Systemic lupus erythematosus with lung involvement -Per nephrology, lupus flare are less in HD patient but still possible. -Nephrology decrease prednisone  to 10 mg daily -Continue Plaquenil    Acute metabolic encephalopathy: appeared very anxious, delirious, paranoid, even hallucinating on 8/22 Per sister - patient does drink to cover pain from his lupus, has reduced his alcohol consumption, reports drinking 1.5 pints of alcohol at a time periodically but denies daily drinking.  Resolved   Nausea, vomiting abdominal pain: Likely due to the above.  Improved. - Zofran  ODT as needed   Chronic pancytopenia: Due to SLE, immunosuppression, renal disease    ESRD on HD TTS -Per nephrology   Chronic HFpEF: Appears euvolemic on exam except for ascites. - Volume management by dialysis   Hypertension: BP elevated partly due to missed doses of Coreg .. -Continue carvedilol    Hypothyroidism: TSH 7.9.  Started on Synthroid  50 mcg daily but I  do not know the clinical significance of slightly elevated TSH in acute setting. -Decreased Synthroid  to 25 mcg daily on 9/5. -Repeat TFTs in 4-6 weeks   Hyponatremia: Likely due to ESRD.  Improved on repeat. -Management per nephrology.   Hyperkalemia: Resolved.   GERD -Continue Protonix   Physical deconditioning -HH PT/OT  Right arm swelling/bruising: X-ray without fracture or dislocation.  Pain improved. -Encouraged range of motion activities and arm elevation   Increased nutrient needs Body mass index is 17.83 kg/m. Nutrition Problem: Increased nutrient needs Etiology: chronic illness Signs/Symptoms: estimated needs Interventions: Refer to RD note for recommendations, Nepro shake, MVI     Consultations: Gastroenterology Infectious disease Nephrology Interventional radiology  Time spent 35  minutes  Vital signs Vitals:   01/02/24 1132 01/02/24 1650 01/03/24 0714 01/03/24 0800  BP: (!) 176/62 (!) 189/97  (!) 155/93  Pulse: 72 84  91  Temp: 98.2 F (36.8 C) 98 F (36.7 C)  98.1 F (36.7 C)  Resp: 18 18  18   Height:      Weight:   58 kg   SpO2: 100% 99%  98%  TempSrc:    Oral  BMI (Calculated):   17.84      Discharge exam  GENERAL: No apparent distress.  Appears frail. HEENT: MMM.  Vision and hearing grossly intact.  NECK: Supple.  No apparent JVD.  RESP:  No IWOB.  Fair aeration bilaterally. CVS:  RRR. Heart sounds normal.  ABD/GI/GU: BS+.  Soft.  Some tenderness with palpation.  MSK/EXT:  Right arm swelling.  Bruising just proximal to right elbow.  Able to flex and extend some.  Neurovascular intact. SKIN: no apparent skin lesion or wound NEURO: AA.  Oriented appropriately.  No apparent focal neuro deficit. PSYCH: Calm. Normal affect.   Discharge Instructions Discharge Instructions     Discharge instructions   Complete by: As directed    It has been a pleasure taking care of you!  You were hospitalized due to spontaneous bacterial peritonitis  for which you have been started on IV antibiotics, prednisone  and Plaquenil .  We are discharging you more of these medications.  Someone will contact you about results for your last paracentesis.  Follow-up with your primary care doctor in 1 to 2 weeks or sooner if needed.  Follow-up with your gastroenterologist/hepatologist and rheumatologist as soon as possible.  Continue dialysis.  Please take your medications as prescribed   Take care,   Home infusion instructions   Complete by: As directed    Instructions: Flushing of vascular access device: 0.9% NaCl pre/post medication administration and prn patency; Heparin  100 u/ml, 5ml for implanted ports and Heparin  10u/ml, 5ml for all other central venous catheters.   Increase activity slowly   Complete by: As directed       Allergies as of 01/03/2024   No Known Allergies      Medication List     STOP taking these medications    ibuprofen  200 MG tablet Commonly known as: ADVIL        TAKE these medications    Calcium  Acetate 667 MG Tabs Take 667 mg by mouth 3 (three) times daily before meals.   carvedilol  25 MG tablet Commonly known as: COREG  Take 1 tablet (25 mg total) by mouth 2 (two) times daily.   ceFAZolin  IVPB Commonly known as: ANCEF  Inject 2 g into the vein Every Tuesday,Thursday,and Saturday with dialysis for 9 days. Indication:  SBP  First Dose: Yes Last Day of Therapy:  01/08/24   folic acid  1 MG tablet Commonly known as: FOLVITE  Take 1 tablet (1 mg total) by mouth daily. Start taking on: January 04, 2024   hydroxychloroquine  200 MG tablet Commonly known as: PLAQUENIL  Take 1 tablet (200 mg total) by mouth daily.   levothyroxine  25 MCG tablet Commonly known as: SYNTHROID  Take 1 tablet (25 mcg total) by mouth daily before breakfast. Start taking on: January 04, 2024 What changed:  medication strength how much to take   multivitamin Tabs tablet Take 1 tablet by mouth at bedtime.   ondansetron  4 MG  disintegrating tablet Commonly known as: ZOFRAN -ODT Take 1 tablet (4 mg total) by mouth every 8 (eight) hours as needed for nausea or vomiting.   pantoprazole  40 MG tablet Commonly known as: PROTONIX  Take 1 tablet (40 mg total) by mouth daily. Start taking on: January 04, 2024   polyethylene glycol powder 17 GM/SCOOP powder Commonly known as: MiraLax  Take 17 g by mouth 2 (two) times daily as needed for moderate constipation or mild constipation.   predniSONE  10 MG tablet Commonly known as: DELTASONE  Take 1 tablet (10 mg total) by mouth daily with breakfast for 20 days. Start taking on: January 04, 2024   thiamine  100 MG tablet Commonly known as: Vitamin B-1 Take 1 tablet (100 mg total) by mouth daily. Start taking on: January 04, 2024               Home Infusion Instuctions  (  From admission, onward)           Start     Ordered   12/28/23 0000  Home infusion instructions       Question:  Instructions  Answer:  Flushing of vascular access device: 0.9% NaCl pre/post medication administration and prn patency; Heparin  100 u/ml, 5ml for implanted ports and Heparin  10u/ml, 5ml for all other central venous catheters.   12/28/23 1522             Procedures/Studies: 8/20, 8/26, 8/29 and 9/1-therapeutic paracentesis    IR ABDOMEN US  LIMITED Result Date: 01/03/2024 CLINICAL DATA:  53 year old male with a history of lupus, ESRD, on HD, with recurrent ascites. IR was requested for therapeutic paracentesis. EXAM: ULTRASOUND ABDOMEN LIMITED COMPARISON:  IR paracentesis on 12/24/2023. FINDINGS: Limited ultrasound done of all 4 quadrants. There is only small volume of ascites present. There is no pocket of fluid large enough to allow for safe approach for paracentesis. IMPRESSION: Small volume ascites without pocket of fluid large enough to allow for safe approach for paracentesis. Ultrasound performed by: Carlin Griffon, PA-C Electronically Signed   By: CHRISTELLA.  Shick M.D.   On:  01/03/2024 11:23   DG Elbow 2 Views Right Result Date: 12/31/2023 EXAM: 1 VIEW(S) XRAY OF THE RIGHT ELBOW COMPARISON: None available. CLINICAL HISTORY: Right elbow pain. Reason for exam: Complaint of right elbow pain, bruising and swelling to posterior of elbow noticed; best obtainable pt could not move arm. FINDINGS: BONES AND JOINTS: No acute fracture. No focal osseous lesion. No joint dislocation. SOFT TISSUES: Swelling within the dorsal soft tissues of the distal arm. IMPRESSION: 1. No acute fracture or dislocation. 2. Swelling within the dorsal soft tissues of the distal arm. Electronically signed by: Norman Gatlin MD 12/31/2023 10:22 PM EDT RP Workstation: [id number]   US  LIVER DOPPLER Result Date: 12/29/2023 CLINICAL DATA:  Portal hypertension. EXAM: DUPLEX ULTRASOUND OF LIVER TECHNIQUE: Color and duplex Doppler ultrasound was performed to evaluate the hepatic in-flow and out-flow vessels. COMPARISON:  CT abdomen and pelvis 12/25/2023. FINDINGS: Liver: Normal parenchymal echogenicity. There is nodular liver contour. No focal lesion, mass or intrahepatic biliary ductal dilatation. Main Portal Vein size: 1.3 cm cm Portal Vein Velocities-all hepatopetal. Main Prox:  21.5 cm/sec Main Mid: 26.1 cm/sec Main Dist:  25.7 cm/sec Right: 16.1 cm/sec Left: 16 point cm/sec Hepatic Vein Velocities-all hepatofugal. Right:  57.5 cm/sec Middle:  23.0 cm/sec Left:  33.6 cm/sec IVC: Present and patent with normal respiratory phasicity. Hepatic Artery Velocity:  46.2 cm/sec Splenic Vein Velocity:  26.4 cm/sec Spleen: 15.6 cm x 7.0 cm x 6.8 cm with a total volume of 3 6 cm^3 (411 cm^3 is upper limit normal) Portal Vein Occlusion/Thrombus: No Splenic Vein Occlusion/Thrombus: No Ascites: Complex free fluid is seen throughout the abdomen which contains internal echoes and possible septations. Varices: None IMPRESSION: 1. Patent portal vein with normal direction of flow. 2. Cirrhotic liver morphology. 3. Splenomegaly. 4. Complex  free fluid throughout the abdomen. Electronically Signed   By: Greig Pique M.D.   On: 12/29/2023 19:56   IR Paracentesis Result Date: 12/28/2023 INDICATION: 53 year old male with history of lupus, end-stage renal disease, recurrent ascites. Request for diagnostic and therapeutic paracentesis. EXAM: ULTRASOUND GUIDED diagnostic and therapeutic, left-sided PARACENTESIS MEDICATIONS: 1% lidocaine , 10 mL. COMPLICATIONS: None immediate. PROCEDURE: Informed written consent was obtained from the patient after a discussion of the risks, benefits and alternatives to treatment. A timeout was performed prior to the initiation of the procedure. Initial ultrasound scanning demonstrates  a large amount of ascites within the left lower abdominal quadrant. The left lower abdomen was prepped and draped in the usual sterile fashion. 1% lidocaine  was used for local anesthesia. Following this, a 19 gauge, 7-cm, Yueh catheter was introduced. An ultrasound image was saved for documentation purposes. The paracentesis was performed. The catheter was removed and a dressing was applied. The patient tolerated the procedure well without immediate post procedural complication. Patient received post-procedure intravenous albumin ; see nursing notes for details. FINDINGS: A total of approximately 2.2L of blood-tinged fluid was removed. Samples were sent to the laboratory as requested by the clinical team. IMPRESSION: Successful ultrasound-guided paracentesis yielding 2.2 liters of peritoneal fluid. Procedure performed by: Sherrilee Bal, PA-C under the supervision of Dr. KANDICE Banner Electronically Signed   By: Cordella Banner   On: 12/28/2023 08:25   US  Paracentesis Result Date: 12/27/2023 INDICATION: Patient in the hospital with spontaneous bacterial peritonitis. History of recurrent ascites. IR consulted for diagnostic and therapeutic paracentesis. EXAM: ULTRASOUND GUIDED DIAGNOSTIC AND THERAPEUTIC PARACENTESIS MEDICATIONS: 6 mL 1%  lidocaine . COMPLICATIONS: None immediate. PROCEDURE: Informed written consent was obtained from the patient after a discussion of the risks, benefits and alternatives to treatment. A timeout was performed prior to the initiation of the procedure. Initial ultrasound scanning demonstrates a moderate amount of ascites within the left lower abdominal quadrant. The left lower abdomen was prepped and draped in the usual sterile fashion. 1% lidocaine  was used for local anesthesia. Following this, a 6 Fr Safe-T-Centesis catheter was introduced. An ultrasound image was saved for documentation purposes. The paracentesis was performed. The catheter was removed and a dressing was applied. The patient tolerated the procedure well without immediate post procedural complication. FINDINGS: A total of approximately 920 mL of hazy tan fluid was removed. Samples were sent to the laboratory as requested by the clinical team. IMPRESSION: Successful ultrasound-guided paracentesis yielding 920 mL of peritoneal fluid. Performed by: Wyatt Pommier, PA-C Electronically Signed   By: Juliene Balder M.D.   On: 12/27/2023 11:49   CT ABDOMEN PELVIS W CONTRAST Result Date: 12/25/2023 CLINICAL DATA:  Provided history: Ascites, fever of unknown origin. EXAM: CT CHEST, ABDOMEN, AND PELVIS WITH CONTRAST TECHNIQUE: Multidetector CT imaging of the chest, abdomen and pelvis was performed following the standard protocol during bolus administration of intravenous contrast. RADIATION DOSE REDUCTION: This exam was performed according to the departmental dose-optimization program which includes automated exposure control, adjustment of the mA and/or kV according to patient size and/or use of iterative reconstruction technique. CONTRAST:  75mL OMNIPAQUE  IOHEXOL  350 MG/ML SOLN COMPARISON:  Abdominopelvic CT 12/14/2023 FINDINGS: CT CHEST FINDINGS Cardiovascular: The heart is enlarged. Coronary artery and mitral annulus calcifications. Trace pericardial effusion.  Mild aortic atherosclerosis without aneurysm or acute aortic findings. Main pulmonary artery is dilated at 3.8 cm. No obvious central pulmonary embolus on this exam not tailored to pulmonary artery assessment. Dilated vasculature in the left upper extremity, likely related to dialysis access. Mediastinum/Nodes: No mediastinal, hilar, or axillary adenopathy. No visible thyroid  nodule. Mild wall thickening of the distal esophagus. Lungs/Pleura: Mild biapical pleuroparenchymal scarring. Trace dependent subsegmental atelectasis in the lower lobes. Trace left pleural effusion. No confluent airspace disease or evidence of pneumonia. No features of pulmonary edema. No endobronchial lesion or debris. Musculoskeletal: There are no acute or suspicious osseous abnormalities. CT ABDOMEN PELVIS FINDINGS Hepatobiliary: No focal liver lesion. Question of subtle capsular nodularity of the liver. Layering density in the gallbladder was not seen on prior exam and may represent vicarious excretion  of contrast. No biliary dilatation. Pancreas: No ductal dilatation or inflammation. Spleen: Mildly enlarged, 13.8 cm AP. Heterogeneous enhancement felt to be due to phase of contrast. Adrenals/Urinary Tract: No adrenal nodule. Bilateral renal parenchymal atrophy consistent with chronic renal disease. No hydronephrosis or suspicious renal lesion. Absent renal excretion again demonstrated on delayed phase. The urinary bladder is decompressed. Stomach/Bowel: Detailed bowel assessment is limited in the absence of enteric contrast and presence of ascites. Wall thickening of the distal esophagus. Equivocal pre pyloric gastric wall thickening. Diffuse small bowel wall thickening which is nonspecific in the setting of ascites. There is scattered fluid-filled and mildly dilated loops of small bowel. Normal appendix. Moderate formed stool in the colon. Areas of colonic wall thickening involving the ascending and transverse colon. Left colonic  diverticulosis without focal diverticulitis. No bowel pneumatosis. Vascular/Lymphatic: Aortic atherosclerosis without aneurysm. Patent portal vein. Tortuous splenic vein likely portal hypertension. Scattered small retroperitoneal nodes are unchanged over the last 11 days. Reproductive: Prostate is unremarkable.  Right scrotal hydrocele. Other: Moderate volume ascites again demonstrated. There is increasing peritoneal thickening and enhancement about ascites particularly in the right abdomen and pelvis. Moderate to large volume ascites with enhancement tracks into a right inguinal hernia extending into the scrotum. There is generalized edema and soft tissue thickening of the omentum. No free intra-abdominal air. Musculoskeletal: There are no acute or suspicious osseous abnormalities. Chronic bilateral femoral head avascular necrosis. IMPRESSION: 1. Moderate volume ascites. Increasing peritoneal thickening and enhancement about ascites particularly in the right abdomen and pelvis. Findings are suspicious for peritonitis. Generalized edema and soft tissue thickening of the omentum, nonspecific in the setting of ascites. 2. Ascites with enhancement extends into a right inguinal hernia and into the scrotum. 3. Areas of colonic and small bowel wall thickening, nonspecific in the setting of ascites. 4. Wall thickening of the distal esophagus, can be seen with reflux or esophagitis. 5. Cardiomegaly with coronary artery calcifications. 6. Dilated main pulmonary artery, can be seen with pulmonary arterial hypertension. 7. Trace left pleural effusion. 8. Question of subtle capsular nodularity of the liver, can be seen with cirrhosis. Mild splenomegaly and tortuous splenic vein likely portal hypertension. Aortic Atherosclerosis (ICD10-I70.0). Electronically Signed   By: Andrea Gasman M.D.   On: 12/25/2023 15:57   CT CHEST W CONTRAST Result Date: 12/25/2023 CLINICAL DATA:  Provided history: Ascites, fever of unknown  origin. EXAM: CT CHEST, ABDOMEN, AND PELVIS WITH CONTRAST TECHNIQUE: Multidetector CT imaging of the chest, abdomen and pelvis was performed following the standard protocol during bolus administration of intravenous contrast. RADIATION DOSE REDUCTION: This exam was performed according to the departmental dose-optimization program which includes automated exposure control, adjustment of the mA and/or kV according to patient size and/or use of iterative reconstruction technique. CONTRAST:  75mL OMNIPAQUE  IOHEXOL  350 MG/ML SOLN COMPARISON:  Abdominopelvic CT 12/14/2023 FINDINGS: CT CHEST FINDINGS Cardiovascular: The heart is enlarged. Coronary artery and mitral annulus calcifications. Trace pericardial effusion. Mild aortic atherosclerosis without aneurysm or acute aortic findings. Main pulmonary artery is dilated at 3.8 cm. No obvious central pulmonary embolus on this exam not tailored to pulmonary artery assessment. Dilated vasculature in the left upper extremity, likely related to dialysis access. Mediastinum/Nodes: No mediastinal, hilar, or axillary adenopathy. No visible thyroid  nodule. Mild wall thickening of the distal esophagus. Lungs/Pleura: Mild biapical pleuroparenchymal scarring. Trace dependent subsegmental atelectasis in the lower lobes. Trace left pleural effusion. No confluent airspace disease or evidence of pneumonia. No features of pulmonary edema. No endobronchial lesion or debris. Musculoskeletal:  There are no acute or suspicious osseous abnormalities. CT ABDOMEN PELVIS FINDINGS Hepatobiliary: No focal liver lesion. Question of subtle capsular nodularity of the liver. Layering density in the gallbladder was not seen on prior exam and may represent vicarious excretion of contrast. No biliary dilatation. Pancreas: No ductal dilatation or inflammation. Spleen: Mildly enlarged, 13.8 cm AP. Heterogeneous enhancement felt to be due to phase of contrast. Adrenals/Urinary Tract: No adrenal nodule. Bilateral  renal parenchymal atrophy consistent with chronic renal disease. No hydronephrosis or suspicious renal lesion. Absent renal excretion again demonstrated on delayed phase. The urinary bladder is decompressed. Stomach/Bowel: Detailed bowel assessment is limited in the absence of enteric contrast and presence of ascites. Wall thickening of the distal esophagus. Equivocal pre pyloric gastric wall thickening. Diffuse small bowel wall thickening which is nonspecific in the setting of ascites. There is scattered fluid-filled and mildly dilated loops of small bowel. Normal appendix. Moderate formed stool in the colon. Areas of colonic wall thickening involving the ascending and transverse colon. Left colonic diverticulosis without focal diverticulitis. No bowel pneumatosis. Vascular/Lymphatic: Aortic atherosclerosis without aneurysm. Patent portal vein. Tortuous splenic vein likely portal hypertension. Scattered small retroperitoneal nodes are unchanged over the last 11 days. Reproductive: Prostate is unremarkable.  Right scrotal hydrocele. Other: Moderate volume ascites again demonstrated. There is increasing peritoneal thickening and enhancement about ascites particularly in the right abdomen and pelvis. Moderate to large volume ascites with enhancement tracks into a right inguinal hernia extending into the scrotum. There is generalized edema and soft tissue thickening of the omentum. No free intra-abdominal air. Musculoskeletal: There are no acute or suspicious osseous abnormalities. Chronic bilateral femoral head avascular necrosis. IMPRESSION: 1. Moderate volume ascites. Increasing peritoneal thickening and enhancement about ascites particularly in the right abdomen and pelvis. Findings are suspicious for peritonitis. Generalized edema and soft tissue thickening of the omentum, nonspecific in the setting of ascites. 2. Ascites with enhancement extends into a right inguinal hernia and into the scrotum. 3. Areas of  colonic and small bowel wall thickening, nonspecific in the setting of ascites. 4. Wall thickening of the distal esophagus, can be seen with reflux or esophagitis. 5. Cardiomegaly with coronary artery calcifications. 6. Dilated main pulmonary artery, can be seen with pulmonary arterial hypertension. 7. Trace left pleural effusion. 8. Question of subtle capsular nodularity of the liver, can be seen with cirrhosis. Mild splenomegaly and tortuous splenic vein likely portal hypertension. Aortic Atherosclerosis (ICD10-I70.0). Electronically Signed   By: Andrea Gasman M.D.   On: 12/25/2023 15:57   IR Paracentesis Result Date: 12/21/2023 INDICATION: 53 year old male with a history of lupus, end-stage renal disease, and recurrent ascites. Recent diagnosis of SBP. Request for therapeutic paracentesis. EXAM: ULTRASOUND GUIDED right PARACENTESIS MEDICATIONS: 1% lidocaine , 9 mL. COMPLICATIONS: None immediate. PROCEDURE: Informed written consent was obtained from the patient after a discussion of the risks, benefits and alternatives to treatment. A timeout was performed prior to the initiation of the procedure. Initial ultrasound scanning demonstrates a moderate amount of loculated ascites within the right lower abdominal quadrant. The right lower abdomen was prepped and draped in the usual sterile fashion. 1% lidocaine  was used for local anesthesia. Following this, a 19 gauge, 7-cm, Yueh catheter was introduced. An ultrasound image was saved for documentation purposes. The paracentesis was performed. The catheter was removed and a dressing was applied. The patient tolerated the procedure well without immediate post procedural complication. FINDINGS: A total of approximately 700 mL of cloudy yellow fluid was removed. IMPRESSION: Successful ultrasound-guided paracentesis yielding 700  mL of peritoneal fluid. Procedure performed by: Sherrilee Bal, PA-C under supervision of Dr. Vanice Electronically Signed   By: CHRISTELLA.  Shick  M.D.   On: 12/21/2023 13:48   CT HEAD WO CONTRAST ( ) Result Date: 12/18/2023 CLINICAL DATA:  Head trauma, coagulopathy (Age 62-64y) EXAM: CT HEAD WITHOUT CONTRAST TECHNIQUE: Contiguous axial images were obtained from the base of the skull through the vertex without intravenous contrast. RADIATION DOSE REDUCTION: This exam was performed according to the departmental dose-optimization program which includes automated exposure control, adjustment of the mA and/or kV according to patient size and/or use of iterative reconstruction technique. COMPARISON:  None Available. FINDINGS: Mildly motion limited study. Brain: No evidence of acute infarction, hemorrhage, hydrocephalus, extra-axial collection or mass lesion/mass effect. Vascular: No hyperdense vessel. Skull: No acute fracture. Sinuses/Orbits: Clear sinuses.  No acute orbital findings. Other: No mastoid effusions. IMPRESSION: No evidence of acute intracranial abnormality. Electronically Signed   By: Gilmore GORMAN Molt M.D.   On: 12/18/2023 00:43   IR Paracentesis Result Date: 12/15/2023 INDICATION: 53 year old male. History of lupus, end-stage renal disease with recurrent ascites. Request for therapeutic paracentesis EXAM: ULTRASOUND GUIDED LEFT-SIDED THERAPEUTIC PARACENTESIS MEDICATIONS: Lidocaine  1% 10 mL COMPLICATIONS: None immediate. PROCEDURE: Informed written consent was obtained from the patient after a discussion of the risks, benefits and alternatives to treatment. A timeout was performed prior to the initiation of the procedure. Initial ultrasound scanning demonstrates a small amount of ascites within the right lower abdominal quadrant. The right lower abdomen was prepped and draped in the usual sterile fashion. 1% lidocaine  was used for local anesthesia. Following this, a 6 Fr Safe-T-Centesis catheter was introduced. An ultrasound image was saved for documentation purposes. The paracentesis was performed. The catheter was removed and a dressing was  applied. The patient tolerated the procedure well without immediate post procedural complication. FINDINGS: A total of approximately 1.8 L of straw-colored fluid was removed. IMPRESSION: Successful ultrasound-guided therapeutic left-sided paracentesis yielding 1.8 liters of peritoneal fluid. Performed by Delon Beagle NP Electronically Signed   By: CHRISTELLA.  Shick M.D.   On: 12/15/2023 10:41   ECHOCARDIOGRAM COMPLETE Result Date: 12/15/2023    ECHOCARDIOGRAM REPORT   Patient Name:   [name] Date of Exam: 12/15/2023 Medical Rec #:  [id number]       Height:       71.0 in Accession #:    [id number]      Weight:       168.4 lb Date of Birth:  12-Oct-1970       BSA:          1.960 m Patient Age:    53 years        BP:           107/74 mmHg Patient Gender: M               HR:           90 bpm. Exam Location:  Inpatient Procedure: 2D Echo, Cardiac Doppler and Color Doppler (Both Spectral and Color            Flow Doppler were utilized during procedure). Indications:    CHF Acute Systolic I50.21  History:        Patient has prior history of Echocardiogram examinations, most                 recent 04/28/2020. CHF.  Sonographer:    Tinnie Gosling RDCS Referring Phys: [phone] ERIC CHEN IMPRESSIONS  1. Left ventricular ejection fraction, by  estimation, is 55 to 60%. The left ventricle has normal function. The left ventricle has no regional wall motion abnormalities. There is severe concentric left ventricular hypertrophy. Left ventricular diastolic  parameters are consistent with Grade I diastolic dysfunction (impaired relaxation).  2. Right ventricular systolic function is normal. The right ventricular size is normal.  3. Left atrial size was moderately dilated.  4. Right atrial size was mildly dilated.  5. The mitral valve is normal in structure. Trivial mitral valve regurgitation. No evidence of mitral stenosis.  6. The aortic valve is tricuspid. There is mild calcification of the aortic valve. Aortic valve regurgitation is  not visualized. Aortic valve sclerosis/calcification is present, without any evidence of aortic stenosis.  7. Aortic dilatation noted. There is mild dilatation of the aortic root, measuring 40 mm.  8. The inferior vena cava is dilated in size with >50% respiratory variability, suggesting right atrial pressure of 8 mmHg. FINDINGS  Left Ventricle: Left ventricular ejection fraction, by estimation, is 55 to 60%. The left ventricle has normal function. The left ventricle has no regional wall motion abnormalities. The left ventricular internal cavity size was normal in size. There is  severe concentric left ventricular hypertrophy. Left ventricular diastolic parameters are consistent with Grade I diastolic dysfunction (impaired relaxation). Right Ventricle: The right ventricular size is normal. No increase in right ventricular wall thickness. Right ventricular systolic function is normal. Left Atrium: Left atrial size was moderately dilated. Right Atrium: Right atrial size was mildly dilated. Pericardium: There is no evidence of pericardial effusion. Mitral Valve: The mitral valve is normal in structure. Trivial mitral valve regurgitation. No evidence of mitral valve stenosis. MV peak gradient, 4.2 mmHg. The mean mitral valve gradient is 3.0 mmHg. Tricuspid Valve: The tricuspid valve is normal in structure. Tricuspid valve regurgitation is mild . No evidence of tricuspid stenosis. Aortic Valve: The aortic valve is tricuspid. There is mild calcification of the aortic valve. Aortic valve regurgitation is not visualized. Aortic valve sclerosis/calcification is present, without any evidence of aortic stenosis. Pulmonic Valve: The pulmonic valve was normal in structure. Pulmonic valve regurgitation is trivial. No evidence of pulmonic stenosis. Aorta: Aortic dilatation noted. There is mild dilatation of the aortic root, measuring 40 mm. Venous: The inferior vena cava is dilated in size with greater than 50% respiratory  variability, suggesting right atrial pressure of 8 mmHg. IAS/Shunts: No atrial level shunt detected by color flow Doppler.  LEFT VENTRICLE PLAX 2D LVIDd:         4.60 cm   Diastology LVIDs:         3.20 cm   LV e' lateral:   8.16 cm/s LV PW:         2.10 cm   LV E/e' lateral: 13.1 LV IVS:        2.00 cm LVOT diam:     2.10 cm LV SV:         79 LV SV Index:   40 LVOT Area:     3.46 cm  RIGHT VENTRICLE             IVC RV S prime:     14.30 cm/s  IVC diam: 2.30 cm TAPSE (M-mode): 2.1 cm LEFT ATRIUM             Index        RIGHT ATRIUM           Index LA diam:        3.70 cm 1.89 cm/m  RA Area:     18.20 cm LA Vol (A2C):   76.1 ml 38.82 ml/m  RA Volume:   44.90 ml  22.91 ml/m LA Vol (A4C):   91.2 ml 46.53 ml/m LA Biplane Vol: 93.0 ml 47.45 ml/m  AORTIC VALVE LVOT Vmax:   127.00 cm/s LVOT Vmean:  80.100 cm/s LVOT VTI:    0.229 m  AORTA Ao Root diam: 4.00 cm Ao Asc diam:  3.70 cm MITRAL VALVE MV Area (PHT): 4.65 cm     SHUNTS MV Area VTI:   3.34 cm     Systemic VTI:  0.23 m MV Peak grad:  4.2 mmHg     Systemic Diam: 2.10 cm MV Mean grad:  3.0 mmHg MV Vmax:       1.03 m/s MV Vmean:      83.0 cm/s MV Decel Time: 163 msec MV E velocity: 107.00 cm/s MV A velocity: 117.00 cm/s MV E/A ratio:  0.91 Toribio Fuel MD Electronically signed by Toribio Fuel MD Signature Date/Time: 12/15/2023/9:40:29 AM    Final    CT ABDOMEN PELVIS W CONTRAST Result Date: 12/14/2023 CLINICAL DATA:  Right lower quadrant abdominal pain for 2 days. Nausea and vomiting. Hemodialysis patient. EXAM: CT ABDOMEN AND PELVIS WITH CONTRAST TECHNIQUE: Multidetector CT imaging of the abdomen and pelvis was performed using the standard protocol following bolus administration of intravenous contrast. RADIATION DOSE REDUCTION: This exam was performed according to the departmental dose-optimization program which includes automated exposure control, adjustment of the mA and/or kV according to patient size and/or use of iterative reconstruction  technique. CONTRAST:  75mL OMNIPAQUE  IOHEXOL  350 MG/ML SOLN COMPARISON:  Abdominopelvic CT 08/04/2021 and 06/29/2021. FINDINGS: Lower chest: Interval improved aeration of the left lung base with mild residual atelectasis or scarring. No confluent airspace disease or significant pleural effusion. Moderate cardiomegaly without significant pericardial fluid. There is coronary artery atherosclerosis with calcifications of the mitral annulus. Hepatobiliary: The liver is normal in density without suspicious focal abnormality. No evidence of gallstones, gallbladder wall thickening or biliary dilatation. Pancreas: Unremarkable. No pancreatic ductal dilatation or surrounding inflammatory changes. Spleen: Normal in size without focal abnormality. Adrenals/Urinary Tract: Both adrenal glands appear normal. Moderate to severe renal cortical thinning and atrophy bilaterally, progressive from previous examination. No significant contrast excretion is demonstrated on the delayed images. There is no hydronephrosis or urinary tract calculus. The bladder appears unremarkable for its degree of distention. Stomach/Bowel: No enteric contrast administered. The stomach appears unremarkable for its degree of distention. Mild small bowel wall thickening with mucosal hyperenhancement in the mid abdomen. The appendix is not clearly seen on the current study, although there is no pericecal inflammation to suggest appendicitis. Mild diffuse colonic wall thickening. There are diverticular changes throughout the descending and sigmoid colon without focal surrounding inflammation. No evidence of bowel obstruction or perforation. Vascular/Lymphatic: Scattered prominent retroperitoneal lymph nodes, similar to previous study and likely reactive. Aortic and branch vessel atherosclerosis without evidence of aneurysm or large vessel occlusion. The portal, superior mesenteric and splenic veins are patent. Reproductive: The prostate gland and seminal  vesicles appear unremarkable. Other: Moderate to large volume ascites again noted, similar in overall volume to the previous CT. There is apparent new diffuse peritoneal thickening without apparent nodularity. A large amount of fluid extends into the fall canal and screw, incompletely visualized but increased in volume from the previous study. No herniated bowel, organized fluid collection or pneumoperitoneum. Musculoskeletal: Chronic femoral head osteonecrosis bilaterally without subchondral collapse or significant secondary degenerative changes. Mild  lumbar spondylosis. No acute osseous findings are identified. Generalized subcutaneous edema appears similar to previous CT. IMPRESSION: 1. Moderate to large volume ascites, similar in overall volume to the previous CT. There is new diffuse smooth peritoneal thickening without apparent nodularity, suspicious for peritonitis. Consider diagnostic paracentesis. 2. Mild small bowel and colonic wall thickening, nonspecific in the setting of ascites. No evidence of bowel obstruction or perforation. The appendix is not discretely visualized, although there are no specific signs of appendicitis. 3. Progressive renal cortical thinning and atrophy bilaterally. No hydronephrosis or urinary tract calculus. 4. Chronic femoral head osteonecrosis bilaterally without subchondral collapse or significant secondary degenerative changes. 5.  Aortic Atherosclerosis (ICD10-I70.0). Electronically Signed   By: Elsie Perone M.D.   On: 12/14/2023 14:56   DG Chest 2 View Result Date: 12/14/2023 CLINICAL DATA:  SHOB EXAM: CHEST - 2 VIEW COMPARISON:  June 29, 2021 FINDINGS: Lower lung volumes . Bilateral perihilar interstitial opacities. No pneumothorax. Trace left pleural effusion. Mild cardiomegaly. Tortuous aorta with aortic atherosclerosis. No acute fracture or destructive lesions. Multilevel thoracic osteophytosis. IMPRESSION: Bilateral perihilar interstitial opacities, which may  represent bronchovascular crowding due to low lung volumes, interstitial edema, or atypical/viral infection, in the correct clinical context. Electronically Signed   By: Rogelia Myers M.D.   On: 12/14/2023 13:09       The results of significant diagnostics from this hospitalization (including imaging, microbiology, ancillary and laboratory) are listed below for reference.     Microbiology: Recent Results (from the past 240 hours)  Acid Fast Smear (AFB)     Status: None   Collection Time: 12/26/23 12:16 PM   Specimen: Perineal; Other  Result Value Ref Range Status   AFB Specimen Processing Concentration  Final   Acid Fast Smear Negative  Final    Comment: (NOTE) Performed At: [facility] [address] [place], [place] [id number] Jennette Shorter MD Ey:[id number]    Source (AFB) PERITONEAL  Final    Comment: Performed at [facility], [address] [place], [place] [address]  MT-RIF NAA W Cult, Non-Sputum     Status: None (Preliminary result)   Collection Time: 12/26/23 12:17 PM   Specimen: Pleural Fluid  Result Value Ref Range Status   Source PERITONEAL  Final    Comment: Performed at [facility], [address] [place], [place] [address]   Specimen Source [facility]  Final    Comment: (NOTE) Test not performed. The required specimen for the test ordered was not received. Received: Peritoneal Fluid Requires: BAL, Pleural or CSF Notified Tomadur S. 12/30/2023    M tuberculosis complex WRORD  Final    Comment: (NOTE) Test not performed. The required specimen for the test ordered was not received. Received: Peritoneal Fluid Requires: BAL, Pleural or CSF Notified Tomadur S. 12/30/2023 This test was developed and its performance characteristics determined by [facility]. It has not been cleared or approved by the Food and Drug Administration.    Rifampin WRORD  Final    Comment: (NOTE) Test not performed. The required specimen for the test ordered  was not received. Received: Peritoneal Fluid Requires: BAL, Pleural or CSF Notified Tomadur S. 12/30/2023 This test was developed and its performance characteristics determined by [facility]. It has not been cleared or approved by the Food and Drug Administration.    AFB Specimen Processing Concentration  Final    Comment: (NOTE) Performed At: [facility] [address] [place], [place] [id number] Jennette Shorter MD Ey:[id number]    Acid  Fast Culture PENDING  Incomplete  Body fluid culture w Gram Stain     Status: None   Collection Time: 12/27/23 10:49 AM   Specimen: Peritoneal Washings; Body Fluid  Result Value Ref Range Status   Specimen Description PERITONEAL  Final   Special Requests NONE  Final   Gram Stain   Final    MODERATE WBC PRESENT, PREDOMINANTLY PMN NO ORGANISMS SEEN    Culture   Final    NO GROWTH 3 DAYS Performed at [facility], [address], [place], [place] [address]    Report Status 12/30/2023 FINAL  Final  Culture, fungus without smear     Status: None (Preliminary result)   Collection Time: 12/27/23 10:49 AM   Specimen: Peritoneal Washings; Other  Result Value Ref Range Status   Specimen Description PERITONEAL  Final   Special Requests NONE  Final   Culture   Final    NO FUNGUS ISOLATED AFTER 7 DAYS Performed at [facility], [address] [place], [place] [address]    Report Status PENDING  Incomplete  Blastomyces Antigen     Status: None   Collection Time: 12/27/23  6:26 PM   Specimen: Blood  Result Value Ref Range Status   Blastomyces Antigen None Detected None Detected ng/mL Final    Comment: (NOTE) Reference Interval: None Detected Reportable Range: 0.31 ng/mL - 20.00 ng/mL Results above 20.00 ng/mL are reported as 'Positive, Above the Limit of Quantification' This test was developed and its performance characteristics determined by [employer]. It has not been cleared or approved by the FDA; however, FDA  clearance or approval is not currently required for clinical use. The results are not intended to be used as the sole means for clinical diagnosis or patient decisions.    Interpretation Negative  Final   Specimen Type SERUM  Final    Comment: (NOTE) Performed At: [facility] [address] [place], [place] [id number] Charleston Pac MD Ey:[id number]      Labs:  CBC: Recent Labs  Lab 12/29/23 0423 12/30/23 1459 12/31/23 0947 01/01/24 0905 01/02/24 0821  WBC 3.7* 7.3 8.4 7.3 6.0  NEUTROABS 2.9  --   --   --   --   HGB 9.0* 9.8* 9.1* 8.3* 8.1*  HCT 27.7* 29.8* 27.7* 25.6* 24.7*  MCV 86.3 86.4 87.1 85.6 87.0  PLT 104* 126* 124* 147* 117*   BMP &GFR Recent Labs  Lab 12/29/23 0423 12/30/23 1459 12/31/23 0947 01/01/24 0905 01/01/24 1237 01/02/24 0821  NA 128* 132* 131* 122* 127* 130*  K 4.5 4.2 5.0 5.2*  --  4.5  CL 92* 94* 93* 84*  --  93*  CO2 28 28 21* 23  --  25  GLUCOSE 91 93 69* 358*  --  92  BUN 40* 27* 43* 55*  --  41*  CREATININE 5.00* 3.68* 5.22* 6.03*  --  4.58*  CALCIUM  7.1* 7.6* 7.7* 7.4*  --  7.7*  MG  --  1.8 2.0 1.9  --  1.8  PHOS  --   --  6.3*  --   --  6.0*   Estimated Creatinine Clearance: 15.1 mL/min (A) (by C-G formula based on SCr of 4.58 mg/dL (H)). Liver & Pancreas: Recent Labs  Lab 12/29/23 0423 12/30/23 1459 12/31/23 0947 01/01/24 0905 01/02/24 0821  AST 31 33  --  25  --   ALT <5 <5  --  <5  --   ALKPHOS 75  78  --  59  --   BILITOT 0.5 0.8  --  0.8  --   PROT 5.9* 6.7  --  6.1*  --   ALBUMIN  1.6* 1.8* 1.7* 1.6* 1.6*   No results for input(s): LIPASE, AMYLASE in the last 168 hours. No results for input(s): AMMONIA in the last 168 hours. Diabetic: No results for input(s): HGBA1C in the last 72 hours. No results for input(s): GLUCAP in the last 168 hours. Cardiac Enzymes: No results for input(s): CKTOTAL, CKMB, CKMBINDEX, TROPONINI in the last 168 hours. No results for input(s): PROBNP in the  last 8760 hours. Coagulation Profile: Recent Labs  Lab 12/30/23 1459  INR 1.2   Thyroid  Function Tests: No results for input(s): TSH, T4TOTAL, FREET4, T3FREE, THYROIDAB in the last 72 hours. Lipid Profile: No results for input(s): CHOL, HDL, LDLCALC, TRIG, CHOLHDL, LDLDIRECT in the last 72 hours. Anemia Panel: No results for input(s): VITAMINB12, FOLATE, FERRITIN, TIBC, IRON, RETICCTPCT in the last 72 hours. Urine analysis:    Component Value Date/Time   COLORURINE AMBER (A) 06/29/2021 0425   APPEARANCEUR HAZY (A) 06/29/2021 0425   LABSPEC 1.018 06/29/2021 0425   PHURINE 7.0 06/29/2021 0425   GLUCOSEU NEGATIVE 06/29/2021 0425   HGBUR SMALL (A) 06/29/2021 0425   BILIRUBINUR NEGATIVE 06/29/2021 0425   KETONESUR 5 (A) 06/29/2021 0425   PROTEINUR >=300 (A) 06/29/2021 0425   NITRITE NEGATIVE 06/29/2021 0425   LEUKOCYTESUR NEGATIVE 06/29/2021 0425   Sepsis Labs: Invalid input(s): PROCALCITONIN, LACTICIDVEN   SIGNED:  Murice Barbar T Yamari Ventola, MD  [facility] 01/03/2024, 2:38 PM

## 2024-01-03 NOTE — TOC Transition Note (Signed)
 Transition of Care [facility]) - Discharge Note   Patient Details  Name: [name] MRN: [id number] Date of Birth: 1970/10/16  [facility] CM/SW Contact:  Tom-Johnson, Aahana Elza Daphne, RN Phone Number: 01/03/2024, 11:14 AM   Clinical Narrative:     Patient is scheduled for discharge today.  Readmission Risk Assessment done. Outpatient f/u, hospital f/u and discharge instructions on AVS. Home health disciplines recommended, patient declined, also declined DME's. Sister, Rosaline at bedside and will transport at discharge.   No further ICM needs noted.       Final next level of care: Home/Self Care (Declined HH) Barriers to Discharge: Barriers Resolved   Patient Goals and CMS Choice Patient states their goals for this hospitalization and ongoing recovery are:: To return home CMS Medicare.gov Compare Post Acute Care list provided to:: Patient Choice offered to / list presented to : NA      Discharge Placement                Patient to be transferred to facility by: Sister Name of family member notified: [facility]    Discharge Plan and Services Additional resources added to the After Visit Summary for   In-house Referral: Clinical Social Work              DME Arranged: N/A DME Agency: NA       HH Arranged: Refused HH HH Agency: NA        Social Drivers of Health (SDOH) Interventions SDOH Screenings   Food Insecurity: No Food Insecurity (12/15/2023)  Housing: Low Risk  (12/15/2023)  Transportation Needs: No Transportation Needs (12/15/2023)  Utilities: Not At Risk (12/15/2023)  Depression (PHQ2-9): Low Risk  (01/02/2022)  Tobacco Use: Low Risk  (12/14/2023)     Readmission Risk Interventions    01/03/2024   11:13 AM  Readmission Risk Prevention Plan  Transportation Screening Complete  HRI or Home Care Consult Complete  Social Work Consult for Recovery Care Planning/Counseling Complete  Palliative Care Screening Not Applicable  Medication  Review Oceanographer) Referral to Pharmacy

## 2024-01-03 NOTE — Discharge Planning (Signed)
 [facility] Patient Discharge Orders - [facility] CLINIC: [facility]  Patient's name: [name] Admit/DC Dates: 12/14/2023 - 01/03/24  DISCHARGE DIAGNOSES: SBP: Initial Cx + Klebsiella. Symptoms improved, but repeat fluid analysis with ^^^^ WBC cell ct/LDH, ID involved and s/p thorough w/u - fungal testing indeterminate, extra-pulm TB testing negative.  AMS - improved Lupus Flare: severe joint pain with low C3/4 and ^dsDNA, started on prednisone  20mg  -> tapered to 15, and now 10mg  daily R elbow pain/bruising s/p fall - negative imaging  HD ORDER CHANGES: Heparin  change: no EDW Change: YES  New EDW: 58kg Bath Change: no  ANEMIA MANAGEMENT: Aranesp : Given: YES   Amount/Date of last dose: 100mcg given 9/7 ESA dose for discharge: mircera 150 mcg IV q 2 weeks, to start on 9/14 IV Iron dose at discharge: per protocol after finishing abx Transfusion: Given: no  BONE/MINERAL MEDICATIONS: Hectorol /Calcitriol change: no Sensipar/Parsabiv change: no  ACCESS INTERVENTION/CHANGE: no Details:  RECENT LABS: Recent Labs  Lab 01/02/24 0821  HGB 8.1*  NA 130*  K 4.5  CALCIUM  7.7*  PHOS 6.0*  ALBUMIN  1.6*   IV ANTIBIOTICS: YES - Cefazolin  2g q HD until 01/08/24 Details: Klebsiella peritonitis. Will also be on empiric antifungal treatment  OTHER ANTICOAGULATION: no Details:  OTHER/APPTS/LAB ORDERS: - Needs f/u with rheumatology - back on plaquenil  and prednisone  - down to 10mg  daily at time of discharge - check full renal labs this week - has been admitted for nearly 3 weeks - d/c to home with sister   D/C Meds to be reconciled by nurse after every discharge.  Completed By: Izetta Boehringer, PA-C [facility] Pager [phone]   Reviewed by: MD:______ RN_______

## 2024-01-03 NOTE — Progress Notes (Signed)
 D/C noted. Contacted [facility] to be advised of pt's d/c today and that pt should resume care tomorrow. Renal PA to send orders to clinic for iv abx with HD at d/c.   Randine Mungo Dialysis Navigator [phone]

## 2024-01-03 NOTE — Progress Notes (Addendum)
 [facility] Progress Note   Subjective:  Seen in room. Ongoing frustration, he wants to be discharged. No CP/dyspnea. Next HD tomorrow.  Objective Vitals:   01/02/24 0751 01/02/24 1132 01/02/24 1650 01/03/24 0800  BP:  (!) 176/62 (!) 189/97 (!) 155/93  Pulse:  72 84 91  Resp:  18 18 18   Temp:  98.2 F (36.8 C) 98 F (36.7 C) 98.1 F (36.7 C)  TempSrc:    Oral  SpO2:  100% 99% 98%  Weight: 58.9 kg     Height:       Physical Exam General: Chronically ill appearing man, NAD. Room air Heart: RRR; no murmur Lungs: CTA anteriorly Abdomen: distended, but soft Extremities: no LE edema, R elbow bruising and edema Dialysis Access: LUE AVF +t/b  Additional Objective Labs: Basic Metabolic Panel: Recent Labs  Lab 12/31/23 0947 01/01/24 0905 01/01/24 1237 01/02/24 0821  NA 131* 122* 127* 130*  K 5.0 5.2*  --  4.5  CL 93* 84*  --  93*  CO2 21* 23  --  25  GLUCOSE 69* 358*  --  92  BUN 43* 55*  --  41*  CREATININE 5.22* 6.03*  --  4.58*  CALCIUM  7.7* 7.4*  --  7.7*  PHOS 6.3*  --   --  6.0*   Liver Function Tests: Recent Labs  Lab 12/29/23 0423 12/30/23 1459 12/31/23 0947 01/01/24 0905 01/02/24 0821  AST 31 33  --  25  --   ALT <5 <5  --  <5  --   ALKPHOS 75 78  --  59  --   BILITOT 0.5 0.8  --  0.8  --   PROT 5.9* 6.7  --  6.1*  --   ALBUMIN  1.6* 1.8* 1.7* 1.6* 1.6*   CBC: Recent Labs  Lab 12/29/23 0423 12/30/23 1459 12/31/23 0947 01/01/24 0905 01/02/24 0821  WBC 3.7* 7.3 8.4 7.3 6.0  NEUTROABS 2.9  --   --   --   --   HGB 9.0* 9.8* 9.1* 8.3* 8.1*  HCT 27.7* 29.8* 27.7* 25.6* 24.7*  MCV 86.3 86.4 87.1 85.6 87.0  PLT 104* 126* 124* 147* 117*   Iron Studies:  Recent Labs    12/31/23 0947  IRON 26*  TIBC 123*  FERRITIN 1,632*   Medications:   ceFAZolin  (ANCEF ) IV Stopped (01/01/24 1905)    amLODipine   10 mg Oral Daily   carvedilol   25 mg Oral BID   Chlorhexidine  Gluconate Cloth  6 each Topical Q0600   darbepoetin (ARANESP )  injection - DIALYSIS  100 mcg Subcutaneous Q Sun-1800   docusate sodium   100 mg Oral BID   doxercalciferol   2 mcg Intravenous Q T,Th,Sa-HD   feeding supplement (NEPRO CARB STEADY)  237 mL Oral Q24H   folic acid   1 mg Oral Daily   hydroxychloroquine   200 mg Oral BID   levothyroxine   25 mcg Oral QAC breakfast   multivitamin  1 tablet Oral QHS   pantoprazole   40 mg Oral Daily   polyethylene glycol  17 g Oral Daily   predniSONE   15 mg Oral Q breakfast   thiamine   100 mg Oral Daily    Dialysis Orders TTS - AF 3:45hr, 400/A1.5, EDW 71kg, 2K/2C bath. AVF, no heparin  - Mircera 150mcg IV q 2 weeks (last 8/12) - Hectoral 2mcg IV q HD   Assessment/Plan: SBP: Fluid Cx + Klebsiella, on course of Cefazolin . Symptoms much improved, btu repeat peritoneal cell counts climbing with very  high LDH and negative bacterial Cx. ID involved -> fungal Cx negative so far, TB testing in process, malignancy as well as chylous ascites being considered. Will be finishing 3 week course of Cefazolin  (thru 9/16). Per ID note 9/7 - plan to repeat paracentesis, then start antifungals empirically (Fungitell BDG is +). AMS: Infection v. alcohol withdrawal, resolved. ESRD: Continue HD on TTS schedule - next HD 9/9.  HTN/volume: BP slightly high - ARB and hydralazine  d/c'd - remains on BB, amlodipine  added - UF as tolerated.  Anemia of ESRD: Hgb 8.1 - Aranesp  given 9/7 - follow. Secondary HPTH: CorrCa/Phos ok - resumed hectoral q HD, not getting binders here Nutrition: Alb very low, continue protein supps. SLE: C3/4 low, dsDNA high - c/w flare - on plaquenil  and prednisone  taper (20 -> 15) -> lower to 10mg  starting today. Will need f/u with rheumatology. Recurrent ascites/cirrhosis: Gets intermittent paracenteses since 2022. R elbow pain: s/p fall, negative xray Dispo: OK from renal standpoint once cleared medically.   Angel Boehringer, PA-C 01/03/2024, 9:22 AM  [employer]

## 2024-01-03 NOTE — Progress Notes (Signed)
   01/02/24 2112  Provider Notification  Provider Name/Title Dr. Marcene  Date Provider Notified 01/02/24  Time Provider Notified 2112  Method of Notification Page  Notification Reason Other (Comment)  Provider response See new orders  Date of Provider Response 01/02/24   Patient's B/P was 189/97 (MAP 122).  He is refusing his Coreg , Plaquenil , Colace, and Renal Vitamin at bedtime.  He states he will vomit.  He refused IV Zofran .  Dr. Marcene made aware and PRN B/P ordered if patient is agreeable.  Patient continued to refuse medications.  At 0045, he is now requesting IV Zofran , Melatonin, and oxycodone .  This was given @ 0059.  Still refusing B/P medications.  Suzen Ice RN

## 2024-01-03 NOTE — Procedures (Signed)
  IR BRIEF PROGRESS NOTE:   I was present during limited US  of the abdomen to evaluate for ascites. There is minimal peritoneal fluid with numerous mobile loops of bowel.  There is no adequate pocket of fluid to safely proceed with paracentesis.   Electronically Signed: Carlin DELENA Griffon, PA-C 01/03/2024, 11:09 AM

## 2024-01-03 NOTE — Progress Notes (Signed)
 PT Cancellation Note  Patient Details Name: [name] MRN: [id number] DOB: 30-Oct-1970   Cancelled Treatment:    Reason Eval/Treat Not Completed: Patient at procedure or test/unavailable. Pt preparing to leave floor for paracentesis. PT to re-attempt as time allows.   Erven Sari Shaker 01/03/2024, 8:53 AM

## 2024-01-04 ENCOUNTER — Telehealth: Payer: Self-pay

## 2024-01-04 LAB — IGG: IgG (Immunoglobin G), Serum: 3075 mg/dL — ABNORMAL HIGH (ref 603–1613)

## 2024-01-04 LAB — IGA: IgA: 409 mg/dL — ABNORMAL HIGH (ref 90–386)

## 2024-01-04 LAB — ALPHA-1-ANTITRYPSIN PHENOTYP: A-1 Antitrypsin, Ser: 92 mg/dL — ABNORMAL LOW (ref 101–187)

## 2024-01-04 NOTE — Transitions of Care (Post Inpatient/ED Visit) (Signed)
   01/04/2024  Name: JALEEL ALLEN MRN: [id number] DOB: April 23, 1971  Today's TOC FU Call Status: Today's TOC FU Call Status:: Unsuccessful Call (1st Attempt) Unsuccessful Call (1st Attempt) Date: 01/04/24  Attempted to reach the patient regarding the most recent Inpatient/ED visit. Left a HIPAA approved voicemail message to phone number provided in demographics.    Follow Up Plan: Additional outreach attempts will be made to reach the patient to complete the Transitions of Care (Post Inpatient/ED visit) call.   Richerd Fish, RN, BSN, CCM [facility], [facility] RN Care Manager Direct Dial: [phone]

## 2024-01-05 ENCOUNTER — Telehealth: Payer: Self-pay

## 2024-01-05 DIAGNOSIS — R110 Nausea: Secondary | ICD-10-CM | POA: Diagnosis not present

## 2024-01-05 DIAGNOSIS — K652 Spontaneous bacterial peritonitis: Secondary | ICD-10-CM | POA: Diagnosis not present

## 2024-01-05 DIAGNOSIS — N2581 Secondary hyperparathyroidism of renal origin: Secondary | ICD-10-CM | POA: Diagnosis not present

## 2024-01-05 DIAGNOSIS — N186 End stage renal disease: Secondary | ICD-10-CM | POA: Diagnosis not present

## 2024-01-05 DIAGNOSIS — Z992 Dependence on renal dialysis: Secondary | ICD-10-CM | POA: Diagnosis not present

## 2024-01-05 NOTE — Transitions of Care (Post Inpatient/ED Visit) (Signed)
   01/05/2024  Name: [name] MRN: [id number] DOB: 01/24/1971  Today's TOC FU Call Status: Today's TOC FU Call Status:: Unsuccessful Call (2nd Attempt) Unsuccessful Call (2nd Attempt) Date: 01/05/24  Attempted to reach the patient regarding the most recent Inpatient/ED visit. Mailbox is full unable to leave a voicemail message.  Follow Up Plan: Additional outreach attempts will be made to reach the patient to complete the Transitions of Care (Post Inpatient/ED visit) call.   Richerd Fish, RN, BSN, CCM [facility], [facility] RN Care Manager Direct Dial: [phone]

## 2024-01-06 ENCOUNTER — Telehealth: Payer: Self-pay

## 2024-01-06 DIAGNOSIS — K652 Spontaneous bacterial peritonitis: Secondary | ICD-10-CM | POA: Diagnosis not present

## 2024-01-06 DIAGNOSIS — R110 Nausea: Secondary | ICD-10-CM | POA: Diagnosis not present

## 2024-01-06 DIAGNOSIS — N186 End stage renal disease: Secondary | ICD-10-CM | POA: Diagnosis not present

## 2024-01-06 DIAGNOSIS — Z992 Dependence on renal dialysis: Secondary | ICD-10-CM | POA: Diagnosis not present

## 2024-01-06 DIAGNOSIS — N2581 Secondary hyperparathyroidism of renal origin: Secondary | ICD-10-CM | POA: Diagnosis not present

## 2024-01-06 LAB — MISC LABCORP TEST (SEND OUT): Labcorp test code: 9985

## 2024-01-06 NOTE — Transitions of Care (Post Inpatient/ED Visit) (Signed)
   01/06/2024  Name: MOHAMMAD GRANADE MRN: [id number] DOB: 1970/06/12  Today's TOC FU Call Status: Today's TOC FU Call Status:: Unsuccessful Call (3rd Attempt)  Attempted to reach the patient regarding the most recent Inpatient/ED visit. Unable to leave voicemail message  Follow Up Plan: No further outreach attempts will be made at this time. We have been unable to contact the patient.  Richerd Fish, RN, BSN, CCM [facility], [facility] RN Care Manager Direct Dial: [phone]

## 2024-01-08 DIAGNOSIS — Z992 Dependence on renal dialysis: Secondary | ICD-10-CM | POA: Diagnosis not present

## 2024-01-08 DIAGNOSIS — K652 Spontaneous bacterial peritonitis: Secondary | ICD-10-CM | POA: Diagnosis not present

## 2024-01-08 DIAGNOSIS — N2581 Secondary hyperparathyroidism of renal origin: Secondary | ICD-10-CM | POA: Diagnosis not present

## 2024-01-08 DIAGNOSIS — R110 Nausea: Secondary | ICD-10-CM | POA: Diagnosis not present

## 2024-01-08 DIAGNOSIS — N186 End stage renal disease: Secondary | ICD-10-CM | POA: Diagnosis not present

## 2024-01-11 DIAGNOSIS — N2581 Secondary hyperparathyroidism of renal origin: Secondary | ICD-10-CM | POA: Diagnosis not present

## 2024-01-11 DIAGNOSIS — N186 End stage renal disease: Secondary | ICD-10-CM | POA: Diagnosis not present

## 2024-01-11 DIAGNOSIS — Z992 Dependence on renal dialysis: Secondary | ICD-10-CM | POA: Diagnosis not present

## 2024-01-12 DIAGNOSIS — M1990 Unspecified osteoarthritis, unspecified site: Secondary | ICD-10-CM | POA: Diagnosis not present

## 2024-01-12 DIAGNOSIS — D72819 Decreased white blood cell count, unspecified: Secondary | ICD-10-CM | POA: Diagnosis not present

## 2024-01-12 DIAGNOSIS — Z79899 Other long term (current) drug therapy: Secondary | ICD-10-CM | POA: Diagnosis not present

## 2024-01-12 DIAGNOSIS — M329 Systemic lupus erythematosus, unspecified: Secondary | ICD-10-CM | POA: Diagnosis not present

## 2024-01-13 DIAGNOSIS — Z992 Dependence on renal dialysis: Secondary | ICD-10-CM | POA: Diagnosis not present

## 2024-01-13 DIAGNOSIS — N2581 Secondary hyperparathyroidism of renal origin: Secondary | ICD-10-CM | POA: Diagnosis not present

## 2024-01-13 DIAGNOSIS — N186 End stage renal disease: Secondary | ICD-10-CM | POA: Diagnosis not present

## 2024-01-14 ENCOUNTER — Ambulatory Visit: Payer: Self-pay | Admitting: Pediatrics

## 2024-01-14 ENCOUNTER — Telehealth: Payer: Self-pay | Admitting: Infectious Disease

## 2024-01-14 NOTE — Telephone Encounter (Signed)
 ADA was up he should have followup with us 

## 2024-01-14 NOTE — Telephone Encounter (Signed)
 Ptis ADA + in fluid   Had + peripheral fungitell  Looks like Deatrice Needle made recs for biopsy (pt refused this aggressive maneuver) vs repeat paracentesis for fungal cx and then start fluconazole  The ADA was not back with Deatrice called with the fungitell test  MTB PCR Not performed but AFB culture is still cooking  I put him on schedule next week with Trung on Thursday to give him some time to know about this appt and for cultures to cook a little bit longer

## 2024-01-15 DIAGNOSIS — N2581 Secondary hyperparathyroidism of renal origin: Secondary | ICD-10-CM | POA: Diagnosis not present

## 2024-01-15 DIAGNOSIS — Z992 Dependence on renal dialysis: Secondary | ICD-10-CM | POA: Diagnosis not present

## 2024-01-15 DIAGNOSIS — N186 End stage renal disease: Secondary | ICD-10-CM | POA: Diagnosis not present

## 2024-01-17 LAB — CULTURE, FUNGUS WITHOUT SMEAR

## 2024-01-17 NOTE — Telephone Encounter (Signed)
 Sounds good. But sounds like false positive  Thanks Josiephine

## 2024-01-18 DIAGNOSIS — N2581 Secondary hyperparathyroidism of renal origin: Secondary | ICD-10-CM | POA: Diagnosis not present

## 2024-01-18 DIAGNOSIS — N186 End stage renal disease: Secondary | ICD-10-CM | POA: Diagnosis not present

## 2024-01-18 DIAGNOSIS — Z992 Dependence on renal dialysis: Secondary | ICD-10-CM | POA: Diagnosis not present

## 2024-01-18 NOTE — Telephone Encounter (Signed)
 Called patient to confirm appointment, no answer. Left HIPAA compliant voicemail requesting he review MyChart message and call with any questions.   Pilar Westergaard, BSN, RN

## 2024-01-19 NOTE — Telephone Encounter (Signed)
 Spoke with Jerona, he has a conflicting appointment with [facility] on 10/8 at 9 AM, but he was agreeable to being seen that afternoon. Appointment rescheduled for 2 PM 10/8.  Lavenia Stumpo, BSN, RN

## 2024-01-20 DIAGNOSIS — N2581 Secondary hyperparathyroidism of renal origin: Secondary | ICD-10-CM | POA: Diagnosis not present

## 2024-01-20 DIAGNOSIS — Z992 Dependence on renal dialysis: Secondary | ICD-10-CM | POA: Diagnosis not present

## 2024-01-20 DIAGNOSIS — N186 End stage renal disease: Secondary | ICD-10-CM | POA: Diagnosis not present

## 2024-01-24 ENCOUNTER — Emergency Department (HOSPITAL_COMMUNITY)

## 2024-01-24 ENCOUNTER — Inpatient Hospital Stay (HOSPITAL_COMMUNITY)
Admission: EM | Admit: 2024-01-24 | Discharge: 2024-01-31 | DRG: 393 | Disposition: A | Attending: Internal Medicine | Admitting: Internal Medicine

## 2024-01-24 ENCOUNTER — Other Ambulatory Visit: Payer: Self-pay

## 2024-01-24 DIAGNOSIS — K5900 Constipation, unspecified: Secondary | ICD-10-CM | POA: Diagnosis not present

## 2024-01-24 DIAGNOSIS — K766 Portal hypertension: Secondary | ICD-10-CM | POA: Diagnosis present

## 2024-01-24 DIAGNOSIS — K5731 Diverticulosis of large intestine without perforation or abscess with bleeding: Secondary | ICD-10-CM | POA: Diagnosis present

## 2024-01-24 DIAGNOSIS — Z79890 Hormone replacement therapy (postmenopausal): Secondary | ICD-10-CM | POA: Diagnosis not present

## 2024-01-24 DIAGNOSIS — I120 Hypertensive chronic kidney disease with stage 5 chronic kidney disease or end stage renal disease: Secondary | ICD-10-CM | POA: Diagnosis not present

## 2024-01-24 DIAGNOSIS — E039 Hypothyroidism, unspecified: Secondary | ICD-10-CM | POA: Diagnosis not present

## 2024-01-24 DIAGNOSIS — R64 Cachexia: Secondary | ICD-10-CM | POA: Diagnosis not present

## 2024-01-24 DIAGNOSIS — I132 Hypertensive heart and chronic kidney disease with heart failure and with stage 5 chronic kidney disease, or end stage renal disease: Secondary | ICD-10-CM | POA: Diagnosis not present

## 2024-01-24 DIAGNOSIS — Z515 Encounter for palliative care: Secondary | ICD-10-CM | POA: Diagnosis not present

## 2024-01-24 DIAGNOSIS — K6389 Other specified diseases of intestine: Secondary | ICD-10-CM | POA: Diagnosis not present

## 2024-01-24 DIAGNOSIS — I959 Hypotension, unspecified: Secondary | ICD-10-CM | POA: Diagnosis not present

## 2024-01-24 DIAGNOSIS — E8809 Other disorders of plasma-protein metabolism, not elsewhere classified: Secondary | ICD-10-CM | POA: Diagnosis not present

## 2024-01-24 DIAGNOSIS — Z992 Dependence on renal dialysis: Secondary | ICD-10-CM

## 2024-01-24 DIAGNOSIS — Z79899 Other long term (current) drug therapy: Secondary | ICD-10-CM

## 2024-01-24 DIAGNOSIS — R188 Other ascites: Secondary | ICD-10-CM | POA: Diagnosis present

## 2024-01-24 DIAGNOSIS — K625 Hemorrhage of anus and rectum: Secondary | ICD-10-CM | POA: Diagnosis not present

## 2024-01-24 DIAGNOSIS — K7460 Unspecified cirrhosis of liver: Secondary | ICD-10-CM | POA: Diagnosis not present

## 2024-01-24 DIAGNOSIS — K3189 Other diseases of stomach and duodenum: Secondary | ICD-10-CM | POA: Diagnosis present

## 2024-01-24 DIAGNOSIS — N186 End stage renal disease: Secondary | ICD-10-CM | POA: Diagnosis present

## 2024-01-24 DIAGNOSIS — D631 Anemia in chronic kidney disease: Secondary | ICD-10-CM | POA: Diagnosis not present

## 2024-01-24 DIAGNOSIS — K5909 Other constipation: Secondary | ICD-10-CM | POA: Diagnosis not present

## 2024-01-24 DIAGNOSIS — I5032 Chronic diastolic (congestive) heart failure: Secondary | ICD-10-CM | POA: Diagnosis not present

## 2024-01-24 DIAGNOSIS — E861 Hypovolemia: Secondary | ICD-10-CM

## 2024-01-24 DIAGNOSIS — K529 Noninfective gastroenteritis and colitis, unspecified: Secondary | ICD-10-CM | POA: Diagnosis not present

## 2024-01-24 DIAGNOSIS — K922 Gastrointestinal hemorrhage, unspecified: Secondary | ICD-10-CM

## 2024-01-24 DIAGNOSIS — K633 Ulcer of intestine: Principal | ICD-10-CM | POA: Diagnosis present

## 2024-01-24 DIAGNOSIS — M3214 Glomerular disease in systemic lupus erythematosus: Secondary | ICD-10-CM | POA: Diagnosis present

## 2024-01-24 DIAGNOSIS — N5089 Other specified disorders of the male genital organs: Secondary | ICD-10-CM | POA: Diagnosis not present

## 2024-01-24 DIAGNOSIS — K649 Unspecified hemorrhoids: Secondary | ICD-10-CM | POA: Diagnosis present

## 2024-01-24 DIAGNOSIS — Z7952 Long term (current) use of systemic steroids: Secondary | ICD-10-CM

## 2024-01-24 DIAGNOSIS — Z87898 Personal history of other specified conditions: Secondary | ICD-10-CM

## 2024-01-24 DIAGNOSIS — K6289 Other specified diseases of anus and rectum: Secondary | ICD-10-CM | POA: Diagnosis not present

## 2024-01-24 DIAGNOSIS — D509 Iron deficiency anemia, unspecified: Secondary | ICD-10-CM | POA: Diagnosis not present

## 2024-01-24 DIAGNOSIS — E43 Unspecified severe protein-calorie malnutrition: Secondary | ICD-10-CM | POA: Diagnosis present

## 2024-01-24 DIAGNOSIS — R32 Unspecified urinary incontinence: Secondary | ICD-10-CM | POA: Diagnosis present

## 2024-01-24 DIAGNOSIS — K2100 Gastro-esophageal reflux disease with esophagitis, without bleeding: Secondary | ICD-10-CM | POA: Diagnosis not present

## 2024-01-24 DIAGNOSIS — D62 Acute posthemorrhagic anemia: Principal | ICD-10-CM | POA: Diagnosis present

## 2024-01-24 DIAGNOSIS — N2581 Secondary hyperparathyroidism of renal origin: Secondary | ICD-10-CM | POA: Diagnosis not present

## 2024-01-24 DIAGNOSIS — E875 Hyperkalemia: Secondary | ICD-10-CM | POA: Diagnosis present

## 2024-01-24 DIAGNOSIS — E871 Hypo-osmolality and hyponatremia: Secondary | ICD-10-CM | POA: Diagnosis not present

## 2024-01-24 DIAGNOSIS — R54 Age-related physical debility: Secondary | ICD-10-CM | POA: Diagnosis present

## 2024-01-24 DIAGNOSIS — M329 Systemic lupus erythematosus, unspecified: Secondary | ICD-10-CM | POA: Diagnosis not present

## 2024-01-24 LAB — CBC
HCT: 24.7 % — ABNORMAL LOW (ref 39.0–52.0)
Hemoglobin: 8 g/dL — ABNORMAL LOW (ref 13.0–17.0)
MCH: 29.9 pg (ref 26.0–34.0)
MCHC: 32.4 g/dL (ref 30.0–36.0)
MCV: 92.2 fL (ref 80.0–100.0)
Platelets: 106 K/uL — ABNORMAL LOW (ref 150–400)
RBC: 2.68 MIL/uL — ABNORMAL LOW (ref 4.22–5.81)
RDW: 19.9 % — ABNORMAL HIGH (ref 11.5–15.5)
WBC: 2.9 K/uL — ABNORMAL LOW (ref 4.0–10.5)
nRBC: 0 % (ref 0.0–0.2)

## 2024-01-24 LAB — CBC WITH DIFFERENTIAL/PLATELET
Abs Immature Granulocytes: 0.03 K/uL (ref 0.00–0.07)
Basophils Absolute: 0 K/uL (ref 0.0–0.1)
Basophils Relative: 0 %
Eosinophils Absolute: 0.2 K/uL (ref 0.0–0.5)
Eosinophils Relative: 3 %
HCT: 16.6 % — ABNORMAL LOW (ref 39.0–52.0)
Hemoglobin: 5.3 g/dL — CL (ref 13.0–17.0)
Immature Granulocytes: 1 %
Lymphocytes Relative: 15 %
Lymphs Abs: 0.9 K/uL (ref 0.7–4.0)
MCH: 31.5 pg (ref 26.0–34.0)
MCHC: 31.9 g/dL (ref 30.0–36.0)
MCV: 98.8 fL (ref 80.0–100.0)
Monocytes Absolute: 0.9 K/uL (ref 0.1–1.0)
Monocytes Relative: 16 %
Neutro Abs: 3.7 K/uL (ref 1.7–7.7)
Neutrophils Relative %: 65 %
Platelets: 154 K/uL (ref 150–400)
RBC: 1.68 MIL/uL — ABNORMAL LOW (ref 4.22–5.81)
RDW: 20.7 % — ABNORMAL HIGH (ref 11.5–15.5)
Smear Review: NORMAL
WBC: 5.6 K/uL (ref 4.0–10.5)
nRBC: 0 % (ref 0.0–0.2)

## 2024-01-24 LAB — PROTIME-INR
INR: 1 (ref 0.8–1.2)
Prothrombin Time: 14.1 s (ref 11.4–15.2)

## 2024-01-24 LAB — BASIC METABOLIC PANEL WITH GFR
Anion gap: 9 (ref 5–15)
BUN: 97 mg/dL — ABNORMAL HIGH (ref 6–20)
CO2: 22 mmol/L (ref 22–32)
Calcium: 7.4 mg/dL — ABNORMAL LOW (ref 8.9–10.3)
Chloride: 96 mmol/L — ABNORMAL LOW (ref 98–111)
Creatinine, Ser: 6.19 mg/dL — ABNORMAL HIGH (ref 0.61–1.24)
GFR, Estimated: 10 mL/min — ABNORMAL LOW (ref >60)
Glucose, Bld: 128 mg/dL — ABNORMAL HIGH (ref 70–99)
Potassium: 5.7 mmol/L — ABNORMAL HIGH (ref 3.5–5.1)
Sodium: 127 mmol/L — ABNORMAL LOW (ref 135–145)

## 2024-01-24 LAB — CBG MONITORING, ED: Glucose-Capillary: 136 mg/dL — ABNORMAL HIGH (ref 70–99)

## 2024-01-24 LAB — HEPATITIS B SURFACE ANTIGEN: Hepatitis B Surface Ag: NONREACTIVE

## 2024-01-24 LAB — PREPARE RBC (CROSSMATCH)

## 2024-01-24 MED ORDER — SODIUM CHLORIDE 0.9 % IV SOLN: INTRAVENOUS | Status: AC

## 2024-01-24 MED ORDER — RENA-VITE PO TABS
1.0000 | ORAL_TABLET | Freq: Every day | ORAL | Status: DC
  Administered 2024-01-24 – 2024-01-30 (×4): 1 via ORAL
  Filled 2024-01-24 (×7): qty 1

## 2024-01-24 MED ORDER — LIDOCAINE HCL (PF) 1 % IJ SOLN: 5.0000 mL | INTRAMUSCULAR | Status: DC | PRN

## 2024-01-24 MED ORDER — PREDNISONE 20 MG PO TABS
10.0000 mg | ORAL_TABLET | Freq: Every day | ORAL | Status: DC
Start: 2024-01-24 — End: 2024-01-24
  Administered 2024-01-24: 10 mg via ORAL
  Filled 2024-01-24: qty 2

## 2024-01-24 MED ORDER — CHLORHEXIDINE GLUCONATE CLOTH 2 % EX PADS
6.0000 | MEDICATED_PAD | Freq: Every day | CUTANEOUS | Status: DC
  Administered 2024-01-25 – 2024-01-31 (×7): 6 via TOPICAL

## 2024-01-24 MED ORDER — CALCIUM GLUCONATE 10 % IV SOLN
1.0000 g | Freq: Once | INTRAVENOUS | Status: AC
  Administered 2024-01-24: 1 g via INTRAVENOUS
  Filled 2024-01-24: qty 10

## 2024-01-24 MED ORDER — ALBUTEROL SULFATE (2.5 MG/3ML) 0.083% IN NEBU
10.0000 mg | INHALATION_SOLUTION | Freq: Once | RESPIRATORY_TRACT | Status: AC
  Administered 2024-01-24: 10 mg via RESPIRATORY_TRACT
  Filled 2024-01-24: qty 12

## 2024-01-24 MED ORDER — DEXTROSE 50 % IV SOLN
1.0000 | Freq: Once | INTRAVENOUS | Status: AC
  Administered 2024-01-24: 50 mL via INTRAVENOUS
  Filled 2024-01-24: qty 50

## 2024-01-24 MED ORDER — CARVEDILOL 12.5 MG PO TABS
25.0000 mg | ORAL_TABLET | Freq: Two times a day (BID) | ORAL | Status: DC
Start: 2024-01-24 — End: 2024-01-24
  Administered 2024-01-24: 25 mg via ORAL
  Filled 2024-01-24: qty 2

## 2024-01-24 MED ORDER — LEVOTHYROXINE SODIUM 25 MCG PO TABS
25.0000 ug | ORAL_TABLET | Freq: Every day | ORAL | Status: DC
  Administered 2024-01-25 – 2024-01-31 (×7): 25 ug via ORAL
  Filled 2024-01-24 (×7): qty 1

## 2024-01-24 MED ORDER — ALTEPLASE 2 MG IJ SOLR: 2.0000 mg | Freq: Once | INTRAMUSCULAR | Status: DC | PRN

## 2024-01-24 MED ORDER — CALCIUM ACETATE (PHOS BINDER) 667 MG PO CAPS
667.0000 mg | ORAL_CAPSULE | Freq: Three times a day (TID) | ORAL | Status: DC
Start: 2024-01-24 — End: 2024-01-31
  Administered 2024-01-24 – 2024-01-31 (×14): 667 mg via ORAL
  Filled 2024-01-24 (×14): qty 1

## 2024-01-24 MED ORDER — SODIUM CHLORIDE 0.9% IV SOLUTION: Freq: Once | INTRAVENOUS | Status: AC

## 2024-01-24 MED ORDER — PANTOPRAZOLE SODIUM 40 MG IV SOLR
40.0000 mg | Freq: Two times a day (BID) | INTRAVENOUS | Status: DC
Start: 2024-01-24 — End: 2024-01-29
  Administered 2024-01-24 – 2024-01-28 (×10): 40 mg via INTRAVENOUS
  Filled 2024-01-24 (×11): qty 10

## 2024-01-24 MED ORDER — HYDROXYCHLOROQUINE SULFATE 200 MG PO TABS
200.0000 mg | ORAL_TABLET | Freq: Every day | ORAL | Status: DC
  Administered 2024-01-24 – 2024-01-31 (×7): 200 mg via ORAL
  Filled 2024-01-24 (×7): qty 1

## 2024-01-24 MED ORDER — SODIUM ZIRCONIUM CYCLOSILICATE 10 G PO PACK
10.0000 g | PACK | Freq: Once | ORAL | Status: AC
  Administered 2024-01-24: 10 g via ORAL
  Filled 2024-01-24: qty 1

## 2024-01-24 MED ORDER — SODIUM CHLORIDE 0.9 % IV BOLUS
500.0000 mL | Freq: Once | INTRAVENOUS | Status: AC
  Administered 2024-01-24: 500 mL via INTRAVENOUS

## 2024-01-24 MED ORDER — LIDOCAINE-PRILOCAINE 2.5-2.5 % EX CREA: 1.0000 | TOPICAL_CREAM | CUTANEOUS | Status: DC | PRN

## 2024-01-24 MED ORDER — SODIUM BICARBONATE 8.4 % IV SOLN
50.0000 meq | Freq: Once | INTRAVENOUS | Status: AC
  Administered 2024-01-24: 50 meq via INTRAVENOUS
  Filled 2024-01-24: qty 50

## 2024-01-24 MED ORDER — HEPARIN SODIUM (PORCINE) 1000 UNIT/ML DIALYSIS: 1000.0000 [IU] | INTRAMUSCULAR | Status: DC | PRN

## 2024-01-24 MED ORDER — INSULIN ASPART 100 UNIT/ML IV SOLN
5.0000 [IU] | Freq: Once | INTRAVENOUS | Status: AC
  Administered 2024-01-24: 5 [IU] via INTRAVENOUS

## 2024-01-24 MED ORDER — FOLIC ACID 1 MG PO TABS
1.0000 mg | ORAL_TABLET | Freq: Every day | ORAL | Status: DC
  Administered 2024-01-24 – 2024-01-31 (×7): 1 mg via ORAL
  Filled 2024-01-24 (×7): qty 1

## 2024-01-24 MED ORDER — ANTICOAGULANT SODIUM CITRATE 4% (200MG/5ML) IV SOLN: 5.0000 mL | Status: DC | PRN

## 2024-01-24 MED ORDER — PENTAFLUOROPROP-TETRAFLUOROETH EX AERO: 1.0000 | INHALATION_SPRAY | CUTANEOUS | Status: DC | PRN

## 2024-01-24 MED ORDER — SODIUM CHLORIDE 0.9 % IV BOLUS
1000.0000 mL | Freq: Once | INTRAVENOUS | Status: AC
  Administered 2024-01-24: 1000 mL via INTRAVENOUS

## 2024-01-24 NOTE — ED Notes (Signed)
Patient signed consent form for blood transfusion . 

## 2024-01-24 NOTE — ED Notes (Addendum)
 Stool incontinence /dried blood removed and adult brief applied /repositioned on bed  Femoral stick performed by Dr. Raford for blood specimens ,

## 2024-01-24 NOTE — ED Notes (Signed)
 MD at bedside.

## 2024-01-24 NOTE — ED Provider Notes (Addendum)
 [place] EMERGENCY DEPARTMENT AT [facility] Provider Note   CSN: [id number] Arrival date & time: 01/24/24  9544     Patient presents with: Rectal Bleeding  (Hemorrhoids)   [name] is a 53 y.o. male.   The history is provided by the patient.   He has history of lupus, end-stage renal disease on hemodialysis and comes in because of rectal bleeding.  He states that last night he had hemorrhoids that broke loose and bled a large amount.  He thought the bleeding had stopped, but there was still bleeding when he woke up this morning.  He also woke up with complaints of pain in his lower back.  He denies abdominal pain.  He denies nausea or vomiting.  He is not on any anticoagulants.  Of note, his last dialysis session was 4 days ago-he missed his scheduled session from 2 days ago.    Prior to Admission medications   Medication Sig Start Date End Date Taking? Authorizing Provider  Calcium  Acetate 667 MG TABS Take 667 mg by mouth 3 (three) times daily before meals.    [provider]  carvedilol  (COREG ) 25 MG tablet Take 1 tablet (25 mg total) by mouth 2 (two) times daily. 01/03/24   Gonfa, Taye T, MD  folic acid  (FOLVITE ) 1 MG tablet Take 1 tablet (1 mg total) by mouth daily. 01/04/24   Gonfa, Taye T, MD  hydroxychloroquine  (PLAQUENIL ) 200 MG tablet Take 1 tablet (200 mg total) by mouth daily. 01/03/24   Gonfa, Taye T, MD  levothyroxine  (SYNTHROID ) 25 MCG tablet Take 1 tablet (25 mcg total) by mouth daily before breakfast. 01/04/24   Gonfa, Taye T, MD  multivitamin (RENA-VIT) TABS tablet Take 1 tablet by mouth at bedtime. 01/03/24   Gonfa, Taye T, MD  ondansetron  (ZOFRAN -ODT) 4 MG disintegrating tablet Take 1 tablet (4 mg total) by mouth every 8 (eight) hours as needed for nausea or vomiting. 01/03/24   Gonfa, Taye T, MD  pantoprazole  (PROTONIX ) 40 MG tablet Take 1 tablet (40 mg total) by mouth daily. 01/04/24   Gonfa, Taye T, MD  polyethylene glycol powder (MIRALAX ) 17 GM/SCOOP  powder Take 17 g by mouth 2 (two) times daily as needed for moderate constipation or mild constipation. 01/03/24   Gonfa, Taye T, MD  predniSONE  (DELTASONE ) 10 MG tablet Take 1 tablet (10 mg total) by mouth daily with breakfast for 20 days. 01/04/24 01/24/24  Gonfa, Taye T, MD  thiamine  (VITAMIN B-1) 100 MG tablet Take 1 tablet (100 mg total) by mouth daily. 01/04/24   Gonfa, Taye T, MD    Allergies: Patient has no known allergies.    Review of Systems  All other systems reviewed and are negative.   Updated Vital Signs BP 108/68   Pulse 87   Resp 16   SpO2 100%   Physical Exam Vitals and nursing note reviewed.   Chronically ill-appearing 53 year old male, resting comfortably and in no acute distress. Vital signs are normal. Oxygen saturation is 100%, which is normal.  He arrived by ambulance having been incontinent of stool. Head is normocephalic and atraumatic. PERRLA, EOMI.  Back is nontender and there is no CVA tenderness. Lungs are clear without rales, wheezes, or rhonchi. Chest is nontender. Heart has regular rate and rhythm without murmur. Abdomen is soft, flat.  Ecchymosis is present in the left lower quadrant.  There is tenderness across lower abdomen without any rebound or guarding. Genitalia: Uncircumcised penis.  Large right  inguinal hernia present. Rectal: Large external hemorrhoids present without active bleeding.  Light brown stool present in the rectal ampulla-no gross bleeding. Extremities have 2+ edema.  AV fistula present in the left wrist with thrill present. Skin is warm and dry without rash. Neurologic: Mental status is normal, cranial nerves are intact, moves all extremities equally.  (all labs ordered are listed, but only abnormal results are displayed) Labs Reviewed  BASIC METABOLIC PANEL WITH GFR  CBC WITH DIFFERENTIAL/PLATELET  TYPE AND SCREEN    EKG: EKG Interpretation Date/Time:  Monday January 24 2024 05:06:44 EDT Ventricular Rate:  87 PR  Interval:  161 QRS Duration:  95 QT Interval:  374 QTC Calculation: 450 R Axis:   -1  Text Interpretation: Sinus rhythm Probable left atrial enlargement Low voltage, extremity leads Left ventricular hypertrophy Tall T waves inanterior leads concerning for hyperkalemia When compared with ECG of 12/14/2023, T wave amplitude has increased in Anterior leads Confirmed by Raford Lenis ([id number]) on 01/24/2024 5:11:27 AM  Radiology: No results found.   Procedures  Cardiac monitor shows normal sinus rhythm, per my interpretation. Medications Ordered in the ED  sodium chloride  0.9 % bolus 500 mL (has no administration in time range)                                    Medical Decision Making Amount and/or Complexity of Data Reviewed Labs: ordered. Radiology: ordered.  Risk OTC drugs. Prescription drug management. Decision regarding hospitalization.   Rectal bleeding which does appear to be from a hemorrhoid although also need to consider bleeding from diverticulosis, AV malformation.  I have ordered IV fluids and screening labs.  Because of difficulty with finding a vein, I had to draw blood from his right femoral vein.  I have ordered CT of abdomen and pelvis.  I have reviewed his electrocardiogram, and my interpretation is sinus rhythm with tall T waves concerning for hyperkalemia.  I have reviewed his past records and note hospitalization on 12/14/2023 for spontaneous bacterial peritonitis.  CT scan showed stigmata suggesting cirrhosis.  He was treated for bacterial peritonitis and also for possible lupus peritonitis.  He is currently on prednisone  20 mg daily.  Shortly after initial evaluation, blood pressure dropped down to 80 systolic.  I have ordered aggressive IV fluids and blood pressure has come up.  In the meantime, labs have come back and my interpretation is severe anemia with hemoglobin 5.3 compared with 8.1 on 01/02/2024, mild hyperkalemia, hyponatremia which is not felt to be  clinically significant and is similar to recent labs, elevated glucose level, elevated BUN and creatinine consistent with known history of end-stage renal disease.  Because of the electrocardiogram changes concerning for hyperkalemia, I ordered aggressive treatment of hyperkalemia with intravenous glucose, insulin , sodium bicarbonate , calcium  gluconate as well as oral sodium zirconium cyclosilicate  8 and albuterol  via nebulizer.  I ordered blood for type and cross and transfusion.  He is currently getting the first unit of blood.  Consult requested been placed to hospitalist and gastroenterology.  Case signed out to Dr. Lamonte  CRITICAL CARE Performed by: Lenis Raford Total critical care time: 135 minutes Critical care time was exclusive of separately billable procedures and treating other patients. Critical care was necessary to treat or prevent imminent or life-threatening deterioration. Critical care was time spent personally by me on the following activities: development of treatment plan with patient and/or surrogate as well as  nursing, discussions with consultants, evaluation of patient's response to treatment, examination of patient, obtaining history from patient or surrogate, ordering and performing treatments and interventions, ordering and review of laboratory studies, ordering and review of radiographic studies, pulse oximetry and re-evaluation of patient's condition.     Final diagnoses:  Anemia associated with acute blood loss  Hypotension due to hypovolemia  End-stage renal disease on hemodialysis [facility])  Hyperkalemia  Hyponatremia    ED Discharge Orders     None          Raford Lenis, MD 01/24/24 9253    Raford Lenis, MD 01/24/24 [phone]

## 2024-01-24 NOTE — ED Notes (Signed)
 Dr. Raford notified on patient's low Hgb result .

## 2024-01-24 NOTE — Consult Note (Signed)
 [facility] Renal Consultation Note    Indication for Consultation:  Management of ESRD/hemodialysis, anemia, hypertension/volume, and secondary hyperparathyroidism. PCP:  HPI: [name] is a 53 y.o. male with ESRD, SLE, HFrEF, and recent admit with SBP who is being admitted with ?hemorrhoidal bleed.  Presented to ED early this AM with BRBPR, reports my hemorrhoids broke loose and he passed a large amount of blood last night and still noted some bleeding this AM which prompted ED visit. Initial vitals showed slightly low BP. Labs with Na 127, K 5.7, CO2 22, BUN 97, Cr 6.1, WBC 5.6, Hgb 5.3. Abd CT showed large, loculated/organized peritoneal fluid collection mostly occupying the left abdomen and midline lower abdomen (volume there at least 890 mL) and tracks into the right inguinal canal, with inguinal/scrotal fluid volume estimated at 200 mL. Also tenuous communication of this collection also with a small right inferior hepatic lobe subcapsular fluid collection which has not progressed from last month. He was transfused and given Lokelma  for his mild hyperkalemia.  Seen in ED bed. He feels ok today. Despite above CT, he denies abd pain - feels much better from last admit. No CP/dyspnea, fever, chills.  Dialyzes on TTS schedule at [facility]. Last HD was 9/25, he did not go to his Saturday HD. Using L AVF without recent issues.   Past Medical History:  Diagnosis Date   ESRD on hemodialysis (HCC)    TTS at [facility])   Lupus    Thyroid  disease    Past Surgical History:  Procedure Laterality Date   AV FISTULA PLACEMENT Left 08/26/2020   Procedure: LEFT RADIOCEPHALIC ARTERIOVENOUS (AV) FISTULA CREATION;  Surgeon: Eliza Lonni RAMAN, MD;  Location: [facility] OR;  Service: Vascular;  Laterality: Left;   IR FLUORO GUIDE CV LINE RIGHT  04/30/2020   IR PARACENTESIS  05/07/2020   IR PARACENTESIS  05/10/2020   IR PARACENTESIS  06/03/2020   IR PARACENTESIS  06/24/2020    IR PARACENTESIS  07/15/2020   IR PARACENTESIS  07/29/2020   IR PARACENTESIS  08/14/2020   IR PARACENTESIS  06/11/2021   IR PARACENTESIS  08/08/2021   IR PARACENTESIS  09/12/2021   IR PARACENTESIS  10/10/2021   IR PARACENTESIS  03/09/2022   IR PARACENTESIS  07/28/2023   IR PARACENTESIS  09/03/2023   IR PARACENTESIS  10/01/2023   IR PARACENTESIS  12/15/2023   IR PARACENTESIS  12/21/2023   IR PARACENTESIS  12/24/2023   IR PERC TUN PERIT CATH WO PORT S&I /IMAG  05/03/2020   IR US  GUIDE VASC ACCESS RIGHT  04/30/2020   IR US  GUIDE VASC ACCESS RIGHT  05/03/2020   Family History  Problem Relation Age of Onset   Cancer Mother    Social History:  reports that he has never smoked. He has never used smokeless tobacco. He reports current alcohol use. He reports that he does not use drugs.  ROS: As per HPI otherwise negative.  Physical Exam: Vitals:   01/24/24 1530 01/24/24 1532 01/24/24 1615 01/24/24 1630  BP: 122/73  108/66 117/71  Pulse: 79   74  Resp: (!) 22  12   Temp:  98.7 F (37.1 C)    TempSrc:      SpO2: 100%   100%     General: Thin man, NAD. Room air. Head: Normocephalic, atraumatic, sclera non-icteric, mucus membranes are moist. Neck: Supple without lymphadenopathy/masses. JVD not elevated. Lungs: Clear bilaterally to auscultation without wheezes, rales, or rhonchi. Breathing is unlabored. Heart:  RRR with normal S1, S2. No murmurs, rubs, or gallops appreciated. Abdomen: Soft, distended but non-tender Musculoskeletal:  Strength and tone appear normal for age. Lower extremities: 2+ BLE pitting edema, no visible  wounds. Neuro: Alert and oriented X 3. Moves all extremities spontaneously. Psych:  Responds to questions appropriately with a normal affect. Dialysis Access: L forearm AVF +t/b  No Known Allergies Prior to Admission medications   Medication Sig Start Date End Date Taking? Authorizing Provider  amLODipine  (NORVASC ) 10 MG tablet Take 10 mg by mouth daily.   Yes [provider]  Calcium  Acetate 667 MG TABS Take 667 mg by mouth 3 (three) times daily before meals.   Yes [provider]  carvedilol  (COREG ) 25 MG tablet Take 1 tablet (25 mg total) by mouth 2 (two) times daily. 01/03/24  Yes Gonfa, Taye T, MD  hydroxychloroquine  (PLAQUENIL ) 200 MG tablet Take 1 tablet (200 mg total) by mouth daily. 01/03/24  Yes Gonfa, Taye T, MD  levothyroxine  (SYNTHROID ) 25 MCG tablet Take 1 tablet (25 mcg total) by mouth daily before breakfast. Patient taking differently: Take 25 mcg by mouth daily. 01/04/24  Yes Gonfa, Taye T, MD  Methoxy PEG-Epoetin Beta (MIRCERA IJ) Inject 225 mcg into the vein every 14 (fourteen) days. Administered at dialysis clinic   Yes [provider]  ondansetron  (ZOFRAN -ODT) 4 MG disintegrating tablet Take 1 tablet (4 mg total) by mouth every 8 (eight) hours as needed for nausea or vomiting. 01/03/24  Yes Gonfa, Taye T, MD  pantoprazole  (PROTONIX ) 40 MG tablet Take 1 tablet (40 mg total) by mouth daily. Patient taking differently: Take 40 mg by mouth daily as needed (heartburn). 01/04/24  Yes Gonfa, Taye T, MD  predniSONE  (DELTASONE ) 10 MG tablet Take 1 tablet (10 mg total) by mouth daily with breakfast for 20 days. Patient taking differently: Take 20 mg by mouth daily. 01/04/24 01/24/24 Yes Gonfa, Taye T, MD  traMADol  (ULTRAM ) 50 MG tablet Take 25 mg by mouth daily as needed (pain).   Yes [provider]  folic acid  (FOLVITE ) 1 MG tablet Take 1 tablet (1 mg total) by mouth daily. Patient not taking: Reported on 01/24/2024 01/04/24   Gonfa, Taye T, MD  multivitamin (RENA-VIT) TABS tablet Take 1 tablet by mouth at bedtime. Patient not taking: Reported on 01/24/2024 01/03/24   Gonfa, Taye T, MD  polyethylene glycol powder (MIRALAX ) 17 GM/SCOOP powder Take 17 g by mouth 2 (two) times daily as needed for moderate constipation or mild constipation. Patient not taking: Reported on 01/24/2024 01/03/24   Gonfa, Taye T, MD  thiamine  (VITAMIN B-1) 100 MG tablet  Take 1 tablet (100 mg total) by mouth daily. Patient not taking: Reported on 01/24/2024 01/04/24   Gonfa, Taye T, MD   Current Facility-Administered Medications  Medication Dose Route Frequency Provider Last Rate Last Admin   0.9 %  sodium chloride  infusion   Intravenous Continuous Georgina Basket, MD 100 mL/hr at 01/24/24 0925 New Bag at 01/24/24 0925   calcium  acetate (PHOSLO) capsule 667 mg  667 mg Oral TID AC Moore, Willie, MD       folic acid  (FOLVITE ) tablet 1 mg  1 mg Oral Daily Georgina Basket, MD   1 mg at 01/24/24 9070   hydroxychloroquine  (PLAQUENIL ) tablet 200 mg  200 mg Oral Daily Georgina Basket, MD   200 mg at 01/24/24 0937   [START ON 01/25/2024] levothyroxine  (SYNTHROID ) tablet 25 mcg  25 mcg Oral Q0600 Georgina Basket, MD  multivitamin (RENA-VIT) tablet 1 tablet  1 tablet Oral QHS Georgina Basket, MD       pantoprazole  (PROTONIX ) injection 40 mg  40 mg Intravenous Q12H Georgina Basket, MD   40 mg at 01/24/24 0932   Current Outpatient Medications  Medication Sig Dispense Refill   amLODipine  (NORVASC ) 10 MG tablet Take 10 mg by mouth daily.     Calcium  Acetate 667 MG TABS Take 667 mg by mouth 3 (three) times daily before meals.     carvedilol  (COREG ) 25 MG tablet Take 1 tablet (25 mg total) by mouth 2 (two) times daily. 180 tablet 0   hydroxychloroquine  (PLAQUENIL ) 200 MG tablet Take 1 tablet (200 mg total) by mouth daily. 90 tablet 0   levothyroxine  (SYNTHROID ) 25 MCG tablet Take 1 tablet (25 mcg total) by mouth daily before breakfast. (Patient taking differently: Take 25 mcg by mouth daily.) 90 tablet 0   Methoxy PEG-Epoetin Beta (MIRCERA IJ) Inject 225 mcg into the vein every 14 (fourteen) days. Administered at dialysis clinic     ondansetron  (ZOFRAN -ODT) 4 MG disintegrating tablet Take 1 tablet (4 mg total) by mouth every 8 (eight) hours as needed for nausea or vomiting. 20 tablet 0   pantoprazole  (PROTONIX ) 40 MG tablet Take 1 tablet (40 mg total) by mouth daily. (Patient taking  differently: Take 40 mg by mouth daily as needed (heartburn).) 30 tablet 0   predniSONE  (DELTASONE ) 10 MG tablet Take 1 tablet (10 mg total) by mouth daily with breakfast for 20 days. (Patient taking differently: Take 20 mg by mouth daily.) 20 tablet 0   traMADol  (ULTRAM ) 50 MG tablet Take 25 mg by mouth daily as needed (pain).     folic acid  (FOLVITE ) 1 MG tablet Take 1 tablet (1 mg total) by mouth daily. (Patient not taking: Reported on 01/24/2024) 90 tablet 0   multivitamin (RENA-VIT) TABS tablet Take 1 tablet by mouth at bedtime. (Patient not taking: Reported on 01/24/2024) 90 tablet 0   polyethylene glycol powder (MIRALAX ) 17 GM/SCOOP powder Take 17 g by mouth 2 (two) times daily as needed for moderate constipation or mild constipation. (Patient not taking: Reported on 01/24/2024) 255 g 2   thiamine  (VITAMIN B-1) 100 MG tablet Take 1 tablet (100 mg total) by mouth daily. (Patient not taking: Reported on 01/24/2024) 30 tablet 0   Labs: Basic Metabolic Panel: Recent Labs  Lab 01/24/24 0535  NA 127*  K 5.7*  CL 96*  CO2 22  GLUCOSE 128*  BUN 97*  CREATININE 6.19*  CALCIUM  7.4*   CBC: Recent Labs  Lab 01/24/24 0535  WBC 5.6  NEUTROABS 3.7  HGB 5.3*  HCT 16.6*  MCV 98.8  PLT 154   Studies/Results: CT ABDOMEN PELVIS WO CONTRAST Result Date: 01/24/2024 CLINICAL DATA:  53 year old male with left lower quadrant abdominal pain. History of lupus, end-stage renal disease, bacterial peritonitis, recurrent ascites. Cirrhosis suspected by imaging recently. EXAM: CT ABDOMEN AND PELVIS WITHOUT CONTRAST TECHNIQUE: Multidetector CT imaging of the abdomen and pelvis was performed following the standard protocol without IV contrast. RADIATION DOSE REDUCTION: This exam was performed according to the departmental dose-optimization program which includes automated exposure control, adjustment of the mA and/or kV according to patient size and/or use of iterative reconstruction technique. COMPARISON:  CT  Abdomen and Pelvis with contrast 12/25/2023 and earlier. FINDINGS: Lower chest: Cardiomegaly. No pericardial effusion. No pleural effusion. Negative lung bases. Hepatobiliary: The gallbladder is more distended now. No convincing acute pericholecystic inflammation. Noncontrast liver appears stable, with  a small subcapsular fluid collection along the posterior right hepatic lobe (series 3, image 30) stable to slightly smaller. And that collection appears to have a tenuous communication with a larger peritoneal fluid collection which is described below (series 3, image 44). Pancreas: Negative noncontrast appearance. Spleen: Stable spleen size, upper limits of normal. Punctate calcified splenic granulomas. Adrenals/Urinary Tract: Negative adrenal glands. Moderate bilateral renal atrophy. Nonobstructed kidneys. Chronic pararenal space edema/stranding. Diminutive but thick-walled urinary bladder. Stomach/Bowel: Organized, loculated appearing peritoneal fluid collection now occupying the left abdomen, tracking across midline at the pelvic inlet, and continuing into the right inguinal canal. The largest component of this collection encompasses 122 x 157 x 93 mm (AP by transverse by CC) for an estimated intra-abdominal volume of at least 890 mL. Regional mass effect on bowel and mesentery. No gas within the collection. Background generalized mesenteric and omental edema similar to the August CT. No abnormally dilated bowel loops. Large bowel retained stool and also fairly extensive diverticulosis of the descending and sigmoid colon. Generalized mild bowel wall thickening has not significantly changed from last month. Air in fluid in the stomach. Mostly diminutive duodenum. Vascular/Lymphatic: Normal caliber abdominal aorta. Blood pool hypodensity suggesting anemia. Relatively mild Aortoiliac calcified atherosclerosis. Small retroperitoneal lymph nodes appear stable and may be End stage renal disease related. No enlarged or  progressive lymph nodes. Reproductive: Loculated peritoneal fluid collection with fairly developed rind of soft tissue tracks into the right inguinal canal, with estimated fluid volume in the canal of 200 mL (series 8, image 7), similar to mildly increased from last month. Superimposed generalized scrotal edema. Other: Presacral stranding, not significantly changed. No layering free fluid in the pelvis. Musculoskeletal: No acute osseous abnormality identified. IMPRESSION: 1. Large, loculated/organized peritoneal fluid collection mostly occupying the left abdomen and midline lower abdomen (volume there at least 890 mL) and tracks into the right inguinal canal, with inguinal/scrotal fluid volume estimated at 200 mL. Also tenuous communication of this collection also with a small right inferior hepatic lobe subcapsular fluid collection which has not progressed from last month. 2. Generalized omental, mesenteric edema or congestion similar to last month. Mild generalized bowel wall thickening, stable, without acute bowel obstruction. 3. Renal atrophy.  Cardiomegaly.  Lung bases are clear. 4.  Aortic Atherosclerosis (ICD10-I70.0). Electronically Signed   By: VEAR Hurst M.D.   On: 01/24/2024 06:35   Dialysis Orders:  TTS - [facility] 3:45hr, 400/A1.5, EDW 58kg, 2K/2Ca bath, AVF, no heparin  - Last HD 9/25, left at 65.7kg - up on fluids - Mircera 225mcg IV q 2 weeks - last given 9/16. Last Hgb 8.6 on 9/25 - Hectoral on hold  Assessment/Plan:  BRBPR, ?hemorrhoidal bleeding: Hgb down on admit, s/p 2U PRBCs Hyperkalemia: Mild and s/p 1 dose Lokelma .  ESRD:  Usual TTS schedule, but missed last HD. Will add to our list, will be tonight likely.  Hypertension/volume: BP fine, does have BLE edema, UF as tolerated.  Anemia: See above, Will be due for ESA this week.  Metabolic bone disease: CorrCa ok, will add on Phos.   SLE Recent peritonitis: S/p IV Cefazolin  and antifungal treatment (through mid Sept had been plan). CT with  worsened, loculated abd ?abscess. Defer to primary for evaluation/treatment.  Izetta Boehringer, PA-C 01/24/2024, 4:37 PM  [facility]

## 2024-01-24 NOTE — ED Notes (Signed)
Transported to CT scan

## 2024-01-24 NOTE — ED Notes (Signed)
 Ccmd called and pt placed on monitor

## 2024-01-24 NOTE — ED Notes (Signed)
 GI MD at bedside for rectal exam. Pt had episode stool, bedding and brief changed and pt adjusted in bed

## 2024-01-24 NOTE — ED Notes (Signed)
 Multiple attempts made by different nursing staff and phlebotomist to get admission labs ordered with not success.

## 2024-01-24 NOTE — ED Notes (Signed)
 Lab at bedside to attempt to draw

## 2024-01-24 NOTE — Consult Note (Addendum)
 Inpatient Consultation   Referring Provider:      Primary Care Physician:  Berneta Elsie Sayre, MD Primary Gastroenterologist:       Dr. Leigh (Inpatient Consult 11/2023) Reason for Consultation:     Bright red blood per rectum, acute blood loss anemia         HPI  [name] is a 53 y.o. male with a past medical history noteworthy for ESRD on HD, SLE, CHF with EF 55-60%, chronic ascites complicated by SBP (Klebsiella 11/2023), indeterminate workup for cirrhosis during admission August/Sept 2025 admitted to the hospital with acute onset bright blood blood per rectum and acute blood loss anemia (hgb 5.3).   [name] reports that he has experienced significant constipation over the last several weeks.  States that he has been straining to defecate of late.  On the evening of 01/23/2024 he was sitting in his recliner and felt the urge to go to the bathroom.  States that he expelled a large amount of blood and stool that was solid in nature.  Believes that this was related to bleeding from his hemorrhoids.  Denied anorectal pain or abdominal pain.  No nausea, vomiting, hematemesis or coffee-ground emesis.  No epistaxis or recent bruising than normal.    No previous history of GI bleeding No anticoagulation Has never had a colonoscopy No upper endoscopy and prior records No family history of colorectal cancer or polyps  States that he missed hemodialysis on 01/22/2024 because he was not feeling well.  He was recently admitted to the hospital 12/14/2023 - 01/03/2024 for worsening ascites and SBP culture positive for Klebsiella treated with ceftriaxone  and cefazolin .  Also had positive Fungitell -other tests for cryptococcus, Blastomyces, histoplasmosis and fungal culture negative.  Etiology of ascites has been presumed to be related to hypoalbuminemia.  Thought to have had a lupus flare and treated with prednisone  and Plaquenil .  Imaging during that admission was indeterminant for cirrhosis  contributing to ascites -cirrhosis noted on CTAP 12/25/2023 and liver US /doppler 12/29/2023.  Workup for chronic hepatitides was unremarkable with the exception of elevated IgG but that may have been related to infection.  Extensive discussion conducted by GI team with patient sister last admission he reports that he had been evaluated for cirrhosis 3 years prior and this did not yield any specific diagnosis.  She did not wish to pursue any additional workup at this time.  [name] reports that his abdominal distention has been stable.  No subsequent paracenteses since hospital discharge.  On admission to the emergency department he was hypotensive with nadir blood pressure 79/49; no tachycardia; afebrile Labs: Hgb 5.3 down from previous value of 8.1  three weeks ago; sodium 127, potassium 5.7, BUN 97, creatinine 6.19, INR pending Imaging: CTAP -large, loculated organized peritoneal fluid collection mostly occupying the left abdomen and midline lower abdomen tracking into the inguinal canal; generalized edema/congestion stable compared to imaging last month; large bowel retained stool and extensive diverticulosis of the descending and sigmoid colon  On my rectal exam there are large, nonthrombosed hemorrhoids with soft brown stool without melena or hematochezia in the rectal vault  Administered 2 units PRBCs with improvement in blood pressure -currently 117/71 Repeat H&H not yet obtained -bedside CNA reports difficulty with vascular access Plan for hemodialysis in the morning   Past Medical History:  Diagnosis Date   ESRD on hemodialysis (HCC)    TTS at [facility])   Lupus    Thyroid  disease  Past Surgical History:  Procedure Laterality Date   AV FISTULA PLACEMENT Left 08/26/2020   Procedure: LEFT RADIOCEPHALIC ARTERIOVENOUS (AV) FISTULA CREATION;  Surgeon: Eliza Lonni RAMAN, MD;  Location: [facility] OR;  Service: Vascular;  Laterality: Left;   IR FLUORO GUIDE CV LINE RIGHT  04/30/2020    IR PARACENTESIS  05/07/2020   IR PARACENTESIS  05/10/2020   IR PARACENTESIS  06/03/2020   IR PARACENTESIS  06/24/2020   IR PARACENTESIS  07/15/2020   IR PARACENTESIS  07/29/2020   IR PARACENTESIS  08/14/2020   IR PARACENTESIS  06/11/2021   IR PARACENTESIS  08/08/2021   IR PARACENTESIS  09/12/2021   IR PARACENTESIS  10/10/2021   IR PARACENTESIS  03/09/2022   IR PARACENTESIS  07/28/2023   IR PARACENTESIS  09/03/2023   IR PARACENTESIS  10/01/2023   IR PARACENTESIS  12/15/2023   IR PARACENTESIS  12/21/2023   IR PARACENTESIS  12/24/2023   IR PERC TUN PERIT CATH WO PORT S&I /IMAG  05/03/2020   IR US  GUIDE VASC ACCESS RIGHT  04/30/2020   IR US  GUIDE VASC ACCESS RIGHT  05/03/2020    Family History  Problem Relation Age of Onset   Cancer Mother      Social History   Tobacco Use   Smoking status: Never   Smokeless tobacco: Never  Vaping Use   Vaping status: Never Used  Substance Use Topics   Alcohol use: Yes    Comment: 2-4 glasses of wine 2-3 times weekly   Drug use: No    Prior to Admission medications   Medication Sig Start Date End Date Taking? Authorizing Provider  amLODipine  (NORVASC ) 10 MG tablet Take 10 mg by mouth daily.   Yes [provider]  Calcium  Acetate 667 MG TABS Take 667 mg by mouth 3 (three) times daily before meals.   Yes [provider]  carvedilol  (COREG ) 25 MG tablet Take 1 tablet (25 mg total) by mouth 2 (two) times daily. 01/03/24  Yes Gonfa, Taye T, MD  hydroxychloroquine  (PLAQUENIL ) 200 MG tablet Take 1 tablet (200 mg total) by mouth daily. 01/03/24  Yes Gonfa, Taye T, MD  levothyroxine  (SYNTHROID ) 25 MCG tablet Take 1 tablet (25 mcg total) by mouth daily before breakfast. Patient taking differently: Take 25 mcg by mouth daily. 01/04/24  Yes Gonfa, Taye T, MD  Methoxy PEG-Epoetin Beta (MIRCERA IJ) Inject 225 mcg into the vein every 14 (fourteen) days. Administered at dialysis clinic   Yes [provider]  ondansetron  (ZOFRAN -ODT) 4 MG disintegrating  tablet Take 1 tablet (4 mg total) by mouth every 8 (eight) hours as needed for nausea or vomiting. 01/03/24  Yes Gonfa, Taye T, MD  pantoprazole  (PROTONIX ) 40 MG tablet Take 1 tablet (40 mg total) by mouth daily. Patient taking differently: Take 40 mg by mouth daily as needed (heartburn). 01/04/24  Yes Gonfa, Taye T, MD  predniSONE  (DELTASONE ) 10 MG tablet Take 1 tablet (10 mg total) by mouth daily with breakfast for 20 days. Patient taking differently: Take 20 mg by mouth daily. 01/04/24 01/24/24 Yes Gonfa, Taye T, MD  traMADol  (ULTRAM ) 50 MG tablet Take 25 mg by mouth daily as needed (pain).   Yes [provider]  folic acid  (FOLVITE ) 1 MG tablet Take 1 tablet (1 mg total) by mouth daily. Patient not taking: Reported on 01/24/2024 01/04/24   Gonfa, Taye T, MD  multivitamin (RENA-VIT) TABS tablet Take 1 tablet by mouth at bedtime. Patient not taking: Reported on 01/24/2024 01/03/24  Gonfa, Taye T, MD  polyethylene glycol powder (MIRALAX ) 17 GM/SCOOP powder Take 17 g by mouth 2 (two) times daily as needed for moderate constipation or mild constipation. Patient not taking: Reported on 01/24/2024 01/03/24   Gonfa, Taye T, MD  thiamine  (VITAMIN B-1) 100 MG tablet Take 1 tablet (100 mg total) by mouth daily. Patient not taking: Reported on 01/24/2024 01/04/24   Gonfa, Taye T, MD    Current Facility-Administered Medications  Medication Dose Route Frequency Provider Last Rate Last Admin   0.9 %  sodium chloride  infusion   Intravenous Continuous Georgina Basket, MD 100 mL/hr at 01/24/24 0925 New Bag at 01/24/24 0925   calcium  acetate (PHOSLO) capsule 667 mg  667 mg Oral TID AC Moore, Willie, MD       [START ON 01/25/2024] Chlorhexidine  Gluconate Cloth 2 % PADS 6 each  6 each Topical Q0600 Stovall, Kathryn R, PA-C       folic acid  (FOLVITE ) tablet 1 mg  1 mg Oral Daily Georgina Basket, MD   1 mg at 01/24/24 9070   hydroxychloroquine  (PLAQUENIL ) tablet 200 mg  200 mg Oral Daily Georgina Basket, MD   200 mg at 01/24/24  0937   [START ON 01/25/2024] levothyroxine  (SYNTHROID ) tablet 25 mcg  25 mcg Oral Q0600 Georgina Basket, MD       multivitamin (RENA-VIT) tablet 1 tablet  1 tablet Oral QHS Georgina Basket, MD       pantoprazole  (PROTONIX ) injection 40 mg  40 mg Intravenous Q12H Moore, Willie, MD   40 mg at 01/24/24 9067   Current Outpatient Medications  Medication Sig Dispense Refill   amLODipine  (NORVASC ) 10 MG tablet Take 10 mg by mouth daily.     Calcium  Acetate 667 MG TABS Take 667 mg by mouth 3 (three) times daily before meals.     carvedilol  (COREG ) 25 MG tablet Take 1 tablet (25 mg total) by mouth 2 (two) times daily. 180 tablet 0   hydroxychloroquine  (PLAQUENIL ) 200 MG tablet Take 1 tablet (200 mg total) by mouth daily. 90 tablet 0   levothyroxine  (SYNTHROID ) 25 MCG tablet Take 1 tablet (25 mcg total) by mouth daily before breakfast. (Patient taking differently: Take 25 mcg by mouth daily.) 90 tablet 0   Methoxy PEG-Epoetin Beta (MIRCERA IJ) Inject 225 mcg into the vein every 14 (fourteen) days. Administered at dialysis clinic     ondansetron  (ZOFRAN -ODT) 4 MG disintegrating tablet Take 1 tablet (4 mg total) by mouth every 8 (eight) hours as needed for nausea or vomiting. 20 tablet 0   pantoprazole  (PROTONIX ) 40 MG tablet Take 1 tablet (40 mg total) by mouth daily. (Patient taking differently: Take 40 mg by mouth daily as needed (heartburn).) 30 tablet 0   predniSONE  (DELTASONE ) 10 MG tablet Take 1 tablet (10 mg total) by mouth daily with breakfast for 20 days. (Patient taking differently: Take 20 mg by mouth daily.) 20 tablet 0   traMADol  (ULTRAM ) 50 MG tablet Take 25 mg by mouth daily as needed (pain).     folic acid  (FOLVITE ) 1 MG tablet Take 1 tablet (1 mg total) by mouth daily. (Patient not taking: Reported on 01/24/2024) 90 tablet 0   multivitamin (RENA-VIT) TABS tablet Take 1 tablet by mouth at bedtime. (Patient not taking: Reported on 01/24/2024) 90 tablet 0   polyethylene glycol powder (MIRALAX ) 17  GM/SCOOP powder Take 17 g by mouth 2 (two) times daily as needed for moderate constipation or mild constipation. (Patient not taking: Reported on 01/24/2024) 255  g 2   thiamine  (VITAMIN B-1) 100 MG tablet Take 1 tablet (100 mg total) by mouth daily. (Patient not taking: Reported on 01/24/2024) 30 tablet 0    Allergies as of 01/24/2024   (No Known Allergies)    GI Review of Symptoms Significant for hematochezia. Otherwise negative.  General Review of Systems  Review of systems is significant for the pertinent positives and negatives as listed per the HPI.  Full ROS is otherwise negative.    Physical Exam  Vital signs in last 24 hours: Temp:  [97.5 F (36.4 C)-98.7 F (37.1 C)] 98.7 F (37.1 C) (09/29 1532) Pulse Rate:  [72-91] 74 (09/29 1630) Resp:  [7-22] 12 (09/29 1615) BP: (79-129)/(45-73) 117/71 (09/29 1630) SpO2:  [100 %] 100 % (09/29 1630)   General: Thin, cachectic, chronically ill-appearing gentleman Head: Bilateral temporal wasting Eyes:   Pale conjunctiva Ears:  Normal auditory acuity. Lungs: Respirations even and unlabored. Lungs clear to auscultation bilaterally.   No wheezes, crackles, or rhonchi.  Heart: Normal S1, S2.. Regular rate and rhythm.  Abdomen: Protuberant and distended abdomen, nontender, hypoactive bowel sounds Rectal: Large nonthrombosed external hemorrhoids, no palpable masses on DRE, brown soft stool in rectal vault without melena or hematochezia Extremities: Thin extremities with muscle wasting  Lab Results Recent Labs    01/24/24 0535  WBC 5.6  HGB 5.3*  HCT 16.6*  PLT 154   BMET Recent Labs    01/24/24 0535  NA 127*  K 5.7*  CL 96*  CO2 22  GLUCOSE 128*  BUN 97*  CREATININE 6.19*  CALCIUM  7.4*   LFT No results for input(s): PROT, ALBUMIN , AST, ALT, ALKPHOS, BILITOT, BILIDIR, IBILI in the last 72 hours. PT/INR No results for input(s): LABPROT, INR in the last 72 hours.  Radiographic Studies CT ABDOMEN  PELVIS WO CONTRAST Result Date: 01/24/2024 CLINICAL DATA:  53 year old male with left lower quadrant abdominal pain. History of lupus, end-stage renal disease, bacterial peritonitis, recurrent ascites. Cirrhosis suspected by imaging recently. EXAM: CT ABDOMEN AND PELVIS WITHOUT CONTRAST TECHNIQUE: Multidetector CT imaging of the abdomen and pelvis was performed following the standard protocol without IV contrast. RADIATION DOSE REDUCTION: This exam was performed according to the departmental dose-optimization program which includes automated exposure control, adjustment of the mA and/or kV according to patient size and/or use of iterative reconstruction technique. COMPARISON:  CT Abdomen and Pelvis with contrast 12/25/2023 and earlier. FINDINGS: Lower chest: Cardiomegaly. No pericardial effusion. No pleural effusion. Negative lung bases. Hepatobiliary: The gallbladder is more distended now. No convincing acute pericholecystic inflammation. Noncontrast liver appears stable, with a small subcapsular fluid collection along the posterior right hepatic lobe (series 3, image 30) stable to slightly smaller. And that collection appears to have a tenuous communication with a larger peritoneal fluid collection which is described below (series 3, image 44). Pancreas: Negative noncontrast appearance. Spleen: Stable spleen size, upper limits of normal. Punctate calcified splenic granulomas. Adrenals/Urinary Tract: Negative adrenal glands. Moderate bilateral renal atrophy. Nonobstructed kidneys. Chronic pararenal space edema/stranding. Diminutive but thick-walled urinary bladder. Stomach/Bowel: Organized, loculated appearing peritoneal fluid collection now occupying the left abdomen, tracking across midline at the pelvic inlet, and continuing into the right inguinal canal. The largest component of this collection encompasses 122 x 157 x 93 mm (AP by transverse by CC) for an estimated intra-abdominal volume of at least 890 mL.  Regional mass effect on bowel and mesentery. No gas within the collection. Background generalized mesenteric and omental edema similar to the August CT. No  abnormally dilated bowel loops. Large bowel retained stool and also fairly extensive diverticulosis of the descending and sigmoid colon. Generalized mild bowel wall thickening has not significantly changed from last month. Air in fluid in the stomach. Mostly diminutive duodenum. Vascular/Lymphatic: Normal caliber abdominal aorta. Blood pool hypodensity suggesting anemia. Relatively mild Aortoiliac calcified atherosclerosis. Small retroperitoneal lymph nodes appear stable and may be End stage renal disease related. No enlarged or progressive lymph nodes. Reproductive: Loculated peritoneal fluid collection with fairly developed rind of soft tissue tracks into the right inguinal canal, with estimated fluid volume in the canal of 200 mL (series 8, image 7), similar to mildly increased from last month. Superimposed generalized scrotal edema. Other: Presacral stranding, not significantly changed. No layering free fluid in the pelvis. Musculoskeletal: No acute osseous abnormality identified. IMPRESSION: 1. Large, loculated/organized peritoneal fluid collection mostly occupying the left abdomen and midline lower abdomen (volume there at least 890 mL) and tracks into the right inguinal canal, with inguinal/scrotal fluid volume estimated at 200 mL. Also tenuous communication of this collection also with a small right inferior hepatic lobe subcapsular fluid collection which has not progressed from last month. 2. Generalized omental, mesenteric edema or congestion similar to last month. Mild generalized bowel wall thickening, stable, without acute bowel obstruction. 3. Renal atrophy.  Cardiomegaly.  Lung bases are clear. 4.  Aortic Atherosclerosis (ICD10-I70.0). Electronically Signed   By: VEAR Hurst M.D.   On: 01/24/2024 06:35    Endoscopic Studies     None   Clinical  Impression   [name] is a 53 y.o. male with a past medical history noteworthy for ESRD on HD, SLE, CHF with EF 55-60% 11/2023, chronic ascites complicated by SBP (Klebsiella 11/2023), indeterminate workup for cirrhosis during admission August/Sept 2025 admitted to the hospital with acute onset bright blood blood per rectum and acute blood loss anemia (hgb 5.3).   At the time of presentation to the emergency department, Jerona was hypotensive which is now resolved after transfusion with 2 units packed red blood cells.  Rectal examination noteworthy for external hemorrhoids with brown stool and no evidence of melena or hematochezia in the rectal vault. CTAP showed extensive diverticulosis.  Ezrael has never had an upper endoscopy or colonoscopy.  Reviewed that the differential diagnosis for his bleeding could include lower GI bleeding related to hemorrhoids, diverticular bleeding, arteriovenous malformations/angioectasias; no mass lesions have been seen on CT scan.  Given the fact he was hypotensive at the time of presentation brisk upper GI bleeding could be in the differential diagnosis.  He is chronically on prednisone  for his rheumatologic conditions which could place him at risk of peptic ulcer disease.  Workup during last admission was indeterminate for cirrhosis raising consideration of portal hypertensive etiologies contributing to bleeding -varices, portal hypertensive gastropathy, etc.   I discussed with Ricardo that if his bleeding is due to either hemorrhoids or diverticuli, the bleeding will cease on its own without intervention.  If this is the case he can be monitored conservatively.  If, however, he continues to manifest overt bleeding or decline in hemoglobin may need to consider endoscopic evaluation with colonoscopy +/- EGD if there is concern for brisk upper GI bleeding.  Patient will need to be dialyzed prior to any future endoscopic procedures.  With respect to his history of ascites  and SBP, he reports that his abdominal distention has been stable.  He has been afebrile.  He has not required paracentesis since his last hospital admission.  Fluid  collection now appears to be large and loculated.  Notation that it tracks into the inguinal canal.  Follow-up scheduled with infectious disease on 02/02/2024 for further follow-up of history of SBP.   Plan  Monitor for overt GI bleeding. Continue to monitor serial hemoglobin hematocrit Transfuse as appropriate to maintain hemoglobin >= 8.0 Follow serial H&H every 6-12 hours Continue Protonix  40 mg IV every 12 hours Consume clear liquid diet today while being monitored Agree with proceeding with hemodialysis; rectifying electrolyte abnormalities. Will need future bowel regimen -patient declines MiraLAX ; when stable from a bleeding perspective can consider trial of Dulcolax or Linzess During last admission, patient and family declined to pursue further workup for potential cirrhosis -imaging this admission does not document cirrhotic morphology. Follow-up scheduled with infectious disease 02/02/2024 most recent episode of SBP.  Thank you for your kind consultation, we will continue to follow. Dr. San and Vina Dasen, NP will continue to follow Mr. Crean thia admission  [name]  01/24/2024, 5:27 PM  Angel Hausen, MD [facility]

## 2024-01-24 NOTE — H&P (Addendum)
 History and Physical    Patient: [name]:[id number] DOB: [birth year] DOA: 01/24/2024 DOS: the patient was seen and examined on 01/24/2024 PCP: Berneta Elsie Sayre, MD  Patient coming from: Home  Chief Complaint:  Chief Complaint  Patient presents with   Rectal Bleeding     Hemorrhoids   HPI: [name] is a 53 y.o. male with medical history significant of ESRD on TTS HD, SLE, ascites without diagnosis of cirrhosis, chronic HFrecEF (EF 55-60% in 11/2023; previously 35-40% in 12/2021), and recent admission early this month for SBP  p/w BRBPR and found to have symptomatic anemia.  The patient reported that his hemorrhoids broke loose, resulting in bleeding. The patient noticed the bleeding while sitting in a recliner, as the pants became wet with blood. Initially, the patient felt tired and went to sleep. Upon waking, the patient observed that the bleeding had worsened, with blood present in the recliner. Subsequently, the patient called an ambulance for transportation to the hospital. The patient does not report any other symptoms at this time. Of note, pt think his rectal bleeding may have stopped on evaluation in the ED.  In the ED, pt hypotensive. Labs notable for Na 127, K 5.7, Cr 6.19, and Hb 5.3. EDP started 1U PRBC, consulted GI for presumed GIB, and requested medicine admission.   Review of Systems: As mentioned in the history of present illness. All other systems reviewed and are negative. Past Medical History:  Diagnosis Date   ESRD on hemodialysis (HCC)    TTS at [facility])   Lupus    Thyroid  disease    Past Surgical History:  Procedure Laterality Date   AV FISTULA PLACEMENT Left 08/26/2020   Procedure: LEFT RADIOCEPHALIC ARTERIOVENOUS (AV) FISTULA CREATION;  Surgeon: Eliza Lonni RAMAN, MD;  Location: [facility] OR;  Service: Vascular;  Laterality: Left;   IR FLUORO GUIDE CV LINE RIGHT  04/30/2020   IR PARACENTESIS  05/07/2020   IR PARACENTESIS   05/10/2020   IR PARACENTESIS  06/03/2020   IR PARACENTESIS  06/24/2020   IR PARACENTESIS  07/15/2020   IR PARACENTESIS  07/29/2020   IR PARACENTESIS  08/14/2020   IR PARACENTESIS  06/11/2021   IR PARACENTESIS  08/08/2021   IR PARACENTESIS  09/12/2021   IR PARACENTESIS  10/10/2021   IR PARACENTESIS  03/09/2022   IR PARACENTESIS  07/28/2023   IR PARACENTESIS  09/03/2023   IR PARACENTESIS  10/01/2023   IR PARACENTESIS  12/15/2023   IR PARACENTESIS  12/21/2023   IR PARACENTESIS  12/24/2023   IR PERC TUN PERIT CATH WO PORT S&I /IMAG  05/03/2020   IR US  GUIDE VASC ACCESS RIGHT  04/30/2020   IR US  GUIDE VASC ACCESS RIGHT  05/03/2020   Social History:  reports that he has never smoked. He has never used smokeless tobacco. He reports current alcohol use. He reports that he does not use drugs.  No Known Allergies  Family History  Problem Relation Age of Onset   Cancer Mother     Prior to Admission medications   Medication Sig Start Date End Date Taking? Authorizing Provider  Calcium  Acetate 667 MG TABS Take 667 mg by mouth 3 (three) times daily before meals.    [provider]  carvedilol  (COREG ) 25 MG tablet Take 1 tablet (25 mg total) by mouth 2 (two) times daily. 01/03/24   Gonfa, Taye T, MD  folic acid  (FOLVITE ) 1 MG tablet Take 1 tablet (1 mg total) by mouth daily.  01/04/24   Gonfa, Taye T, MD  hydroxychloroquine  (PLAQUENIL ) 200 MG tablet Take 1 tablet (200 mg total) by mouth daily. 01/03/24   Gonfa, Taye T, MD  levothyroxine  (SYNTHROID ) 25 MCG tablet Take 1 tablet (25 mcg total) by mouth daily before breakfast. 01/04/24   Gonfa, Taye T, MD  multivitamin (RENA-VIT) TABS tablet Take 1 tablet by mouth at bedtime. 01/03/24   Gonfa, Taye T, MD  ondansetron  (ZOFRAN -ODT) 4 MG disintegrating tablet Take 1 tablet (4 mg total) by mouth every 8 (eight) hours as needed for nausea or vomiting. 01/03/24   Gonfa, Taye T, MD  pantoprazole  (PROTONIX ) 40 MG tablet Take 1 tablet (40 mg total) by mouth daily. 01/04/24   Gonfa, Taye  T, MD  polyethylene glycol powder (MIRALAX ) 17 GM/SCOOP powder Take 17 g by mouth 2 (two) times daily as needed for moderate constipation or mild constipation. 01/03/24   Gonfa, Taye T, MD  predniSONE  (DELTASONE ) 10 MG tablet Take 1 tablet (10 mg total) by mouth daily with breakfast for 20 days. 01/04/24 01/24/24  Gonfa, Taye T, MD  thiamine  (VITAMIN B-1) 100 MG tablet Take 1 tablet (100 mg total) by mouth daily. 01/04/24   Kathrin Mignon DASEN, MD    Physical Exam: Vitals:   01/24/24 0815 01/24/24 0816 01/24/24 0819 01/24/24 0820  BP: (!) 79/47 (!) 79/46 (!) 79/45 (!) 81/50  Pulse:   81 82  Resp: (!) 8 12 11 14   Temp:      TempSrc:      SpO2:   100% 100%   General: Alert, oriented x3, resting comfortably in no acute distress Respiratory: Lungs clear to auscultation bilaterally with normal respiratory effort; no w/r/r Cardiovascular: Regular rate and rhythm w/o m/r/g Abdomen: Soft, nontender, nondistended. Positive bowel sounds   Data Reviewed:  Lab Results  Component Value Date   WBC 5.6 01/24/2024   HGB 5.3 (LL) 01/24/2024   HCT 16.6 (L) 01/24/2024   MCV 98.8 01/24/2024   PLT 154 01/24/2024   Lab Results  Component Value Date   GLUCOSE 128 (H) 01/24/2024   CALCIUM  7.4 (L) 01/24/2024   NA 127 (L) 01/24/2024   K 5.7 (H) 01/24/2024   CO2 22 01/24/2024   CL 96 (L) 01/24/2024   BUN 97 (H) 01/24/2024   CREATININE 6.19 (H) 01/24/2024   Lab Results  Component Value Date   ALT <5 01/01/2024   AST 25 01/01/2024   ALKPHOS 59 01/01/2024   BILITOT 0.8 01/01/2024   Lab Results  Component Value Date   INR 1.2 12/30/2023   INR 1.2 12/23/2023   INR 1.1 06/28/2021   Radiology: CT ABDOMEN PELVIS WO CONTRAST Result Date: 01/24/2024 CLINICAL DATA:  53 year old male with left lower quadrant abdominal pain. History of lupus, end-stage renal disease, bacterial peritonitis, recurrent ascites. Cirrhosis suspected by imaging recently. EXAM: CT ABDOMEN AND PELVIS WITHOUT CONTRAST TECHNIQUE:  Multidetector CT imaging of the abdomen and pelvis was performed following the standard protocol without IV contrast. RADIATION DOSE REDUCTION: This exam was performed according to the departmental dose-optimization program which includes automated exposure control, adjustment of the mA and/or kV according to patient size and/or use of iterative reconstruction technique. COMPARISON:  CT Abdomen and Pelvis with contrast 12/25/2023 and earlier. FINDINGS: Lower chest: Cardiomegaly. No pericardial effusion. No pleural effusion. Negative lung bases. Hepatobiliary: The gallbladder is more distended now. No convincing acute pericholecystic inflammation. Noncontrast liver appears stable, with a small subcapsular fluid collection along the posterior right hepatic lobe (series 3, image  30) stable to slightly smaller. And that collection appears to have a tenuous communication with a larger peritoneal fluid collection which is described below (series 3, image 44). Pancreas: Negative noncontrast appearance. Spleen: Stable spleen size, upper limits of normal. Punctate calcified splenic granulomas. Adrenals/Urinary Tract: Negative adrenal glands. Moderate bilateral renal atrophy. Nonobstructed kidneys. Chronic pararenal space edema/stranding. Diminutive but thick-walled urinary bladder. Stomach/Bowel: Organized, loculated appearing peritoneal fluid collection now occupying the left abdomen, tracking across midline at the pelvic inlet, and continuing into the right inguinal canal. The largest component of this collection encompasses 122 x 157 x 93 mm (AP by transverse by CC) for an estimated intra-abdominal volume of at least 890 mL. Regional mass effect on bowel and mesentery. No gas within the collection. Background generalized mesenteric and omental edema similar to the August CT. No abnormally dilated bowel loops. Large bowel retained stool and also fairly extensive diverticulosis of the descending and sigmoid colon.  Generalized mild bowel wall thickening has not significantly changed from last month. Air in fluid in the stomach. Mostly diminutive duodenum. Vascular/Lymphatic: Normal caliber abdominal aorta. Blood pool hypodensity suggesting anemia. Relatively mild Aortoiliac calcified atherosclerosis. Small retroperitoneal lymph nodes appear stable and may be End stage renal disease related. No enlarged or progressive lymph nodes. Reproductive: Loculated peritoneal fluid collection with fairly developed rind of soft tissue tracks into the right inguinal canal, with estimated fluid volume in the canal of 200 mL (series 8, image 7), similar to mildly increased from last month. Superimposed generalized scrotal edema. Other: Presacral stranding, not significantly changed. No layering free fluid in the pelvis. Musculoskeletal: No acute osseous abnormality identified. IMPRESSION: 1. Large, loculated/organized peritoneal fluid collection mostly occupying the left abdomen and midline lower abdomen (volume there at least 890 mL) and tracks into the right inguinal canal, with inguinal/scrotal fluid volume estimated at 200 mL. Also tenuous communication of this collection also with a small right inferior hepatic lobe subcapsular fluid collection which has not progressed from last month. 2. Generalized omental, mesenteric edema or congestion similar to last month. Mild generalized bowel wall thickening, stable, without acute bowel obstruction. 3. Renal atrophy.  Cardiomegaly.  Lung bases are clear. 4.  Aortic Atherosclerosis (ICD10-I70.0). Electronically Signed   By: VEAR Hurst M.D.   On: 01/24/2024 06:35    Assessment and Plan: 55M h/o ESRD on TTS HD, SLE, ascites without diagnosis of cirrhosis, chronic HFrecEF (EF 55-60% in 11/2023; previously 35-40% in 12/2021), and recent admission early this month for SBP  p/w BRBPR and found to have symptomatic anemia.  Symptomatic anemia BRBPR -GI consulted per EDP; apprec eval/recs -2 large  PIVs; f/u CBC q8h, goal Hb >7, transfuse prn; IV PPI BID; MIVF w/ LR 100cc/h x 24h; HOLD OAC and antiplatelet agents   ESRD -Renal consulted; apprec eval/recs for resuming pta HD  SLE -PTA plaquenil  200mg  daily  -HOLD pta prednisone  10mg  daily for now -Consider PPI on d/c for GI ulcer ppx iso chronic steroid use  HFrEF -HOLD pta Coreg  for now iso active GIB  Hypothyroid -PTA Synthroid    Advance Care Planning:   Code Status: Full Code   Consults: Renal and GI  Family Communication: Sister  Severity of Illness: The appropriate patient status for this patient is INPATIENT. Inpatient status is judged to be reasonable and necessary in order to provide the required intensity of service to ensure the patient's safety. The patient's presenting symptoms, physical exam findings, and initial radiographic and laboratory data in the context of their chronic comorbidities  is felt to place them at high risk for further clinical deterioration. Furthermore, it is not anticipated that the patient will be medically stable for discharge from the hospital within 2 midnights of admission.   * I certify that at the point of admission it is my clinical judgment that the patient will require inpatient hospital care spanning beyond 2 midnights from the point of admission due to high intensity of service, high risk for further deterioration and high frequency of surveillance required.*   ------- I spent 55 minutes reviewing previous notes, at the bedside counseling/discussing the treatment plan, and performing clinical documentation.  Author: Marsha Ada, MD 01/24/2024 8:26 AM  For on call review [email or web address].

## 2024-01-24 NOTE — ED Triage Notes (Addendum)
 Patient arrived with EMS from home reports rectal bleeding from hemorrhoids this morning with stool incontinence , hypotensive at home BP=90/60. CBG=166.Missed his hemodialysis treatment last Saturday . HD q Tues/Thu/Sat.

## 2024-01-24 NOTE — Plan of Care (Signed)

## 2024-01-24 NOTE — ED Notes (Signed)
 Lab attempt draw and unable to collect

## 2024-01-24 NOTE — ED Notes (Signed)
 Unable to draw repeat lab pt difficult stick MD made aware

## 2024-01-24 NOTE — ED Notes (Signed)
 Messaged pharm to verify medications

## 2024-01-24 NOTE — ED Provider Notes (Signed)
  Physical Exam  BP (!) 81/50   Pulse 82   Temp 97.8 F (36.6 C)   Resp 14   SpO2 100%   Physical Exam Vitals and nursing note reviewed.  HENT:     Head: Normocephalic and atraumatic.  Eyes:     Pupils: Pupils are equal, round, and reactive to light.  Cardiovascular:     Rate and Rhythm: Normal rate and regular rhythm.  Pulmonary:     Effort: Pulmonary effort is normal.     Breath sounds: Normal breath sounds.  Abdominal:     Palpations: Abdomen is soft.     Tenderness: There is no abdominal tenderness.  Skin:    General: Skin is warm and dry.  Neurological:     Mental Status: He is alert.  Psychiatric:        Mood and Affect: Mood normal.     Procedures  Procedures  ED Course / MDM   Clinical Course as of 01/24/24 0900  Mon Jan 24, 2024  0750 Blood transfusion underway.  Hyperkalemia interventions administered.  Discussed with Dr. Georgina (hospitalist) who accepts patient for admission.  We have not spoken to GI yet.  Dr. Georgina aware [MP]    Clinical Course User Index [MP] Pamella Ozell LABOR, DO   Medical Decision Making I, Ozell Pamella DO, have assumed care of this patient from the previous provider pending GI consult for GI bleeding and admission  Amount and/or Complexity of Data Reviewed Labs: ordered. Radiology: ordered.  Risk OTC drugs. Prescription drug management. Decision regarding hospitalization.          Pamella Ozell LABOR, DO 01/24/24 0900

## 2024-01-25 ENCOUNTER — Inpatient Hospital Stay (HOSPITAL_COMMUNITY)

## 2024-01-25 DIAGNOSIS — N186 End stage renal disease: Secondary | ICD-10-CM | POA: Diagnosis not present

## 2024-01-25 DIAGNOSIS — K5909 Other constipation: Secondary | ICD-10-CM | POA: Diagnosis not present

## 2024-01-25 DIAGNOSIS — Z87898 Personal history of other specified conditions: Secondary | ICD-10-CM | POA: Diagnosis not present

## 2024-01-25 DIAGNOSIS — N5089 Other specified disorders of the male genital organs: Secondary | ICD-10-CM | POA: Diagnosis not present

## 2024-01-25 DIAGNOSIS — E861 Hypovolemia: Secondary | ICD-10-CM

## 2024-01-25 DIAGNOSIS — K5900 Constipation, unspecified: Secondary | ICD-10-CM

## 2024-01-25 DIAGNOSIS — K7460 Unspecified cirrhosis of liver: Secondary | ICD-10-CM | POA: Diagnosis not present

## 2024-01-25 DIAGNOSIS — R188 Other ascites: Secondary | ICD-10-CM

## 2024-01-25 DIAGNOSIS — Z992 Dependence on renal dialysis: Secondary | ICD-10-CM | POA: Diagnosis not present

## 2024-01-25 DIAGNOSIS — D62 Acute posthemorrhagic anemia: Principal | ICD-10-CM

## 2024-01-25 DIAGNOSIS — K5730 Diverticulosis of large intestine without perforation or abscess without bleeding: Secondary | ICD-10-CM | POA: Diagnosis not present

## 2024-01-25 DIAGNOSIS — K625 Hemorrhage of anus and rectum: Secondary | ICD-10-CM | POA: Diagnosis not present

## 2024-01-25 DIAGNOSIS — M3214 Glomerular disease in systemic lupus erythematosus: Secondary | ICD-10-CM | POA: Diagnosis not present

## 2024-01-25 LAB — CBC
HCT: 18.4 % — ABNORMAL LOW (ref 39.0–52.0)
HCT: 17.5 % — ABNORMAL LOW (ref 39.0–52.0)
HCT: 21.1 % — ABNORMAL LOW (ref 39.0–52.0)
HCT: 19.3 % — ABNORMAL LOW (ref 39.0–52.0)
Hemoglobin: 6.1 g/dL — CL (ref 13.0–17.0)
Hemoglobin: 5.7 g/dL — CL (ref 13.0–17.0)
Hemoglobin: 7.2 g/dL — ABNORMAL LOW (ref 13.0–17.0)
Hemoglobin: 6.5 g/dL — CL (ref 13.0–17.0)
MCH: 29.9 pg (ref 26.0–34.0)
MCH: 30.2 pg (ref 26.0–34.0)
MCH: 30.3 pg (ref 26.0–34.0)
MCH: 30.4 pg (ref 26.0–34.0)
MCHC: 33.2 g/dL (ref 30.0–36.0)
MCHC: 32.6 g/dL (ref 30.0–36.0)
MCHC: 34.1 g/dL (ref 30.0–36.0)
MCHC: 33.7 g/dL (ref 30.0–36.0)
MCV: 90.2 fL (ref 80.0–100.0)
MCV: 92.6 fL (ref 80.0–100.0)
MCV: 88.7 fL (ref 80.0–100.0)
MCV: 90.2 fL (ref 80.0–100.0)
Platelets: 103 K/uL — ABNORMAL LOW (ref 150–400)
Platelets: 95 K/uL — ABNORMAL LOW (ref 150–400)
Platelets: 111 K/uL — ABNORMAL LOW (ref 150–400)
Platelets: 92 K/uL — ABNORMAL LOW (ref 150–400)
RBC: 2.04 MIL/uL — ABNORMAL LOW (ref 4.22–5.81)
RBC: 1.89 MIL/uL — ABNORMAL LOW (ref 4.22–5.81)
RBC: 2.38 MIL/uL — ABNORMAL LOW (ref 4.22–5.81)
RBC: 2.14 MIL/uL — ABNORMAL LOW (ref 4.22–5.81)
RDW: 19.7 % — ABNORMAL HIGH (ref 11.5–15.5)
RDW: 19.6 % — ABNORMAL HIGH (ref 11.5–15.5)
RDW: 18.4 % — ABNORMAL HIGH (ref 11.5–15.5)
RDW: 18.5 % — ABNORMAL HIGH (ref 11.5–15.5)
WBC: 2.5 K/uL — ABNORMAL LOW (ref 4.0–10.5)
WBC: 2.2 K/uL — ABNORMAL LOW (ref 4.0–10.5)
WBC: 4.3 K/uL (ref 4.0–10.5)
WBC: 3.6 K/uL — ABNORMAL LOW (ref 4.0–10.5)
nRBC: 2 % — ABNORMAL HIGH (ref 0.0–0.2)
nRBC: 0 % (ref 0.0–0.2)
nRBC: 0 % (ref 0.0–0.2)
nRBC: 0 % (ref 0.0–0.2)

## 2024-01-25 LAB — RENAL FUNCTION PANEL
Albumin: <1.5 g/dL — ABNORMAL LOW (ref 3.5–5.0)
Anion gap: 10 (ref 5–15)
BUN: 100 mg/dL — ABNORMAL HIGH (ref 6–20)
CO2: 22 mmol/L (ref 22–32)
Calcium: 7.2 mg/dL — ABNORMAL LOW (ref 8.9–10.3)
Chloride: 95 mmol/L — ABNORMAL LOW (ref 98–111)
Creatinine, Ser: 6.17 mg/dL — ABNORMAL HIGH (ref 0.61–1.24)
GFR, Estimated: 10 mL/min — ABNORMAL LOW (ref >60)
Glucose, Bld: 91 mg/dL (ref 70–99)
Phosphorus: 4.6 mg/dL (ref 2.5–4.6)
Potassium: 5.8 mmol/L — ABNORMAL HIGH (ref 3.5–5.1)
Sodium: 127 mmol/L — ABNORMAL LOW (ref 135–145)

## 2024-01-25 LAB — PREPARE RBC (CROSSMATCH)

## 2024-01-25 LAB — GLUCOSE, CAPILLARY: Glucose-Capillary: 85 mg/dL (ref 70–99)

## 2024-01-25 MED ORDER — PREDNISONE 20 MG PO TABS
20.0000 mg | ORAL_TABLET | Freq: Every day | ORAL | Status: DC
  Administered 2024-01-25 – 2024-01-31 (×6): 20 mg via ORAL
  Filled 2024-01-25 (×6): qty 1

## 2024-01-25 MED ORDER — SODIUM CHLORIDE 0.9% IV SOLUTION
Freq: Once | INTRAVENOUS | Status: AC
Start: 2024-01-25 — End: 2024-01-26

## 2024-01-25 MED ORDER — SODIUM CHLORIDE 0.9% IV SOLUTION: Freq: Once | INTRAVENOUS | Status: DC

## 2024-01-25 MED ORDER — ACETAMINOPHEN 325 MG PO TABS
650.0000 mg | ORAL_TABLET | Freq: Once | ORAL | Status: AC
  Administered 2024-01-25: 650 mg via ORAL
  Filled 2024-01-25: qty 2

## 2024-01-25 MED ORDER — MIDODRINE HCL 5 MG PO TABS
ORAL_TABLET | ORAL | Status: AC
  Filled 2024-01-25: qty 1

## 2024-01-25 MED ORDER — DARBEPOETIN ALFA 200 MCG/0.4ML IJ SOSY
200.0000 ug | PREFILLED_SYRINGE | INTRAMUSCULAR | Status: DC
  Administered 2024-01-25: 200 ug via SUBCUTANEOUS
  Filled 2024-01-25: qty 0.4

## 2024-01-25 MED ORDER — SODIUM CHLORIDE 0.9% IV SOLUTION: Freq: Once | INTRAVENOUS | Status: AC

## 2024-01-25 MED ORDER — PENTAFLUOROPROP-TETRAFLUOROETH EX AERO
INHALATION_SPRAY | CUTANEOUS | Status: AC
  Filled 2024-01-25: qty 30

## 2024-01-25 MED ORDER — IOHEXOL 350 MG/ML SOLN
70.0000 mL | Freq: Once | INTRAVENOUS | Status: AC | PRN
  Administered 2024-01-25: 70 mL via INTRAVENOUS

## 2024-01-25 MED ORDER — MIDODRINE HCL 5 MG PO TABS
10.0000 mg | ORAL_TABLET | Freq: Three times a day (TID) | ORAL | Status: DC
  Administered 2024-01-25 – 2024-01-30 (×14): 10 mg via ORAL
  Filled 2024-01-25 (×12): qty 2

## 2024-01-25 MED ORDER — DIPHENHYDRAMINE HCL 25 MG PO CAPS
25.0000 mg | ORAL_CAPSULE | Freq: Once | ORAL | Status: AC
  Administered 2024-01-25: 25 mg via ORAL
  Filled 2024-01-25: qty 1

## 2024-01-25 NOTE — Progress Notes (Addendum)
 PROGRESS NOTE        PATIENT DETAILS Name: [name] Age: 53 y.o. Sex: male Date of Birth: Jan 18, 1971 Admit Date: 01/24/2024 Admitting Physician Marsha Ada, MD ERE:[id number], Elsie Sayre, MD  Brief Summary: Patient is a 53 y.o.  male with history of ESRD, ascites without formal diagnosis of liver cirrhosis, SBP (Klebsiella on peritoneal culture on 8/19), chronic HFrEF-presented with lower GI bleeding with acute blood loss anemia.  Significant events: 9/29>> admit to [facility]  Significant studies: 9/29>> CT abdomen/pelvis: Large loculated peritoneal fluid collection left abdomen/midline lower abdomen-tracks into right inguinal canal.  Significant microbiology data: None  Procedures: None  Consults: Nephrology GI  Subjective: Seen in hemodialysis-poor historian-no hematochezia this morning but claimed that he had several episodes last night.  Objective: Vitals: Blood pressure 95/60, pulse 85, temperature 98.4 F (36.9 C), temperature source Oral, resp. rate (!) 9, height 5' 11 (1.803 m), weight 68.2 kg, SpO2 99%.   Exam: Gen Exam:Alert awake-not in any distress HEENT:atraumatic, normocephalic Chest: B/L clear to auscultation anteriorly CVS:S1S2 regular Abdomen: Soft-slightly tense but not distended Extremities:no edema Neurology: Non focal-has generalized weakness. Skin: no rash  Pertinent Labs/Radiology:    Latest Ref Rng & Units 01/25/2024    7:00 AM 01/25/2024    2:56 AM 01/24/2024    8:36 PM  CBC  WBC 4.0 - 10.5 K/uL 2.2  2.5  2.9   Hemoglobin 13.0 - 17.0 g/dL 5.7  6.1  8.0   Hematocrit 39.0 - 52.0 % 17.5  18.4  24.7   Platelets 150 - 400 K/uL 95  103  106     Lab Results  Component Value Date   NA 127 (L) 01/25/2024   K 5.8 (H) 01/25/2024   CL 95 (L) 01/25/2024   CO2 22 01/25/2024      Assessment/Plan: Lower GI bleeding with acute blood loss anemia Either diverticular or hemorrhoidal etiology Hb still low in spite of  getting 2 units of PRBC last night having getting 30 night of PRBC with HD this morning Follow posttransfusion CBC Await GI input.  ESRD on HD TTS Nephrology following.  SLE Resume prednisone  Continue Plaquenil .  Chronic ascites-without formal diagnosis of cirrhosis complicated by SBP (Klebsiella 11/2023) CT reveals now a loculated effusion GI has offered workup for cirrhosis in the past-both patient and family has refused. Currently appears stable-no abdominal pain-afebrile.  Chronic HFpEF Some mild lower extremity edema but otherwise stable Continue volume removal with HD  HTN BP soft but stable Coreg  on hold  Hypothyroidism Continue Synthroid   Code status:   Code Status: Full Code   DVT Prophylaxis: SCDs Start: 01/24/24 0753   Family Communication:Sister-Michell [phone]-left VM 9/30    Disposition Plan: Status is: Inpatient Remains inpatient appropriate because: Severity of illness   Planned Discharge Destination:Home   Diet: Diet Order             Diet clear liquid Fluid consistency: Thin  Diet effective now                     Antimicrobial agents: Anti-infectives (From admission, onward)    Start     Dose/Rate Route Frequency Ordered Stop   01/24/24 1000  hydroxychloroquine  (PLAQUENIL ) tablet 200 mg        200 mg Oral Daily 01/24/24 0833  MEDICATIONS: Scheduled Meds:  sodium chloride    Intravenous Once   calcium  acetate  667 mg Oral TID AC   Chlorhexidine  Gluconate Cloth  6 each Topical Q0600   folic acid   1 mg Oral Daily   hydroxychloroquine   200 mg Oral Daily   levothyroxine   25 mcg Oral Q0600   multivitamin  1 tablet Oral QHS   pantoprazole  (PROTONIX ) IV  40 mg Intravenous Q12H   predniSONE   20 mg Oral Daily   Continuous Infusions:  anticoagulant sodium citrate      PRN Meds:.alteplase , anticoagulant sodium citrate , heparin , lidocaine  (PF), lidocaine -prilocaine , pentafluoroprop-tetrafluoroeth   I have  personally reviewed following labs and imaging studies  LABORATORY DATA: CBC: Recent Labs  Lab 01/24/24 0535 01/24/24 2036 01/25/24 0256 01/25/24 0700  WBC 5.6 2.9* 2.5* 2.2*  NEUTROABS 3.7  --   --   --   HGB 5.3* 8.0* 6.1* 5.7*  HCT 16.6* 24.7* 18.4* 17.5*  MCV 98.8 92.2 90.2 92.6  PLT 154 106* 103* 95*    Basic Metabolic Panel: Recent Labs  Lab 01/24/24 0535 01/25/24 0257  NA 127* 127*  K 5.7* 5.8*  CL 96* 95*  CO2 22 22  GLUCOSE 128* 91  BUN 97* 100*  CREATININE 6.19* 6.17*  CALCIUM  7.4* 7.2*  PHOS  --  4.6    GFR: Estimated Creatinine Clearance: 13.2 mL/min (A) (by C-G formula based on SCr of 6.17 mg/dL (H)).  Liver Function Tests: Recent Labs  Lab 01/25/24 0257  ALBUMIN  <1.5*   No results for input(s): LIPASE, AMYLASE in the last 168 hours. No results for input(s): AMMONIA in the last 168 hours.  Coagulation Profile: Recent Labs  Lab 01/24/24 2036  INR 1.0    Cardiac Enzymes: No results for input(s): CKTOTAL, CKMB, CKMBINDEX, TROPONINI in the last 168 hours.  BNP (last 3 results) No results for input(s): PROBNP in the last 8760 hours.  Lipid Profile: No results for input(s): CHOL, HDL, LDLCALC, TRIG, CHOLHDL, LDLDIRECT in the last 72 hours.  Thyroid  Function Tests: No results for input(s): TSH, T4TOTAL, FREET4, T3FREE, THYROIDAB in the last 72 hours.  Anemia Panel: No results for input(s): VITAMINB12, FOLATE, FERRITIN, TIBC, IRON, RETICCTPCT in the last 72 hours.  Urine analysis:    Component Value Date/Time   COLORURINE AMBER (A) 06/29/2021 0425   APPEARANCEUR HAZY (A) 06/29/2021 0425   LABSPEC 1.018 06/29/2021 0425   PHURINE 7.0 06/29/2021 0425   GLUCOSEU NEGATIVE 06/29/2021 0425   HGBUR SMALL (A) 06/29/2021 0425   BILIRUBINUR NEGATIVE 06/29/2021 0425   KETONESUR 5 (A) 06/29/2021 0425   PROTEINUR >=300 (A) 06/29/2021 0425   NITRITE NEGATIVE 06/29/2021 0425   LEUKOCYTESUR NEGATIVE  06/29/2021 0425    Sepsis Labs: Lactic Acid, Venous    Component Value Date/Time   LATICACIDVEN 2.0 (HH) 12/14/2023 1623    MICROBIOLOGY: No results found for this or any previous visit (from the past 240 hours).  RADIOLOGY STUDIES/RESULTS: CT ABDOMEN PELVIS WO CONTRAST Result Date: 01/24/2024 CLINICAL DATA:  53 year old male with left lower quadrant abdominal pain. History of lupus, end-stage renal disease, bacterial peritonitis, recurrent ascites. Cirrhosis suspected by imaging recently. EXAM: CT ABDOMEN AND PELVIS WITHOUT CONTRAST TECHNIQUE: Multidetector CT imaging of the abdomen and pelvis was performed following the standard protocol without IV contrast. RADIATION DOSE REDUCTION: This exam was performed according to the departmental dose-optimization program which includes automated exposure control, adjustment of the mA and/or kV according to patient size and/or use of iterative reconstruction technique. COMPARISON:  CT Abdomen  and Pelvis with contrast 12/25/2023 and earlier. FINDINGS: Lower chest: Cardiomegaly. No pericardial effusion. No pleural effusion. Negative lung bases. Hepatobiliary: The gallbladder is more distended now. No convincing acute pericholecystic inflammation. Noncontrast liver appears stable, with a small subcapsular fluid collection along the posterior right hepatic lobe (series 3, image 30) stable to slightly smaller. And that collection appears to have a tenuous communication with a larger peritoneal fluid collection which is described below (series 3, image 44). Pancreas: Negative noncontrast appearance. Spleen: Stable spleen size, upper limits of normal. Punctate calcified splenic granulomas. Adrenals/Urinary Tract: Negative adrenal glands. Moderate bilateral renal atrophy. Nonobstructed kidneys. Chronic pararenal space edema/stranding. Diminutive but thick-walled urinary bladder. Stomach/Bowel: Organized, loculated appearing peritoneal fluid collection now occupying  the left abdomen, tracking across midline at the pelvic inlet, and continuing into the right inguinal canal. The largest component of this collection encompasses 122 x 157 x 93 mm (AP by transverse by CC) for an estimated intra-abdominal volume of at least 890 mL. Regional mass effect on bowel and mesentery. No gas within the collection. Background generalized mesenteric and omental edema similar to the August CT. No abnormally dilated bowel loops. Large bowel retained stool and also fairly extensive diverticulosis of the descending and sigmoid colon. Generalized mild bowel wall thickening has not significantly changed from last month. Air in fluid in the stomach. Mostly diminutive duodenum. Vascular/Lymphatic: Normal caliber abdominal aorta. Blood pool hypodensity suggesting anemia. Relatively mild Aortoiliac calcified atherosclerosis. Small retroperitoneal lymph nodes appear stable and may be End stage renal disease related. No enlarged or progressive lymph nodes. Reproductive: Loculated peritoneal fluid collection with fairly developed rind of soft tissue tracks into the right inguinal canal, with estimated fluid volume in the canal of 200 mL (series 8, image 7), similar to mildly increased from last month. Superimposed generalized scrotal edema. Other: Presacral stranding, not significantly changed. No layering free fluid in the pelvis. Musculoskeletal: No acute osseous abnormality identified. IMPRESSION: 1. Large, loculated/organized peritoneal fluid collection mostly occupying the left abdomen and midline lower abdomen (volume there at least 890 mL) and tracks into the right inguinal canal, with inguinal/scrotal fluid volume estimated at 200 mL. Also tenuous communication of this collection also with a small right inferior hepatic lobe subcapsular fluid collection which has not progressed from last month. 2. Generalized omental, mesenteric edema or congestion similar to last month. Mild generalized bowel wall  thickening, stable, without acute bowel obstruction. 3. Renal atrophy.  Cardiomegaly.  Lung bases are clear. 4.  Aortic Atherosclerosis (ICD10-I70.0). Electronically Signed   By: VEAR Hurst M.D.   On: 01/24/2024 06:35     LOS: 1 day   Donalda Applebaum, MD  [facility]    To contact the attending provider between 7A-7P or the covering provider during after hours 7P-7A, please log into the web site [email or web address] and access using universal [place] password for that web site. If you do not have the password, please call the hospital operator.  01/25/2024, 9:09 AM

## 2024-01-25 NOTE — Plan of Care (Signed)

## 2024-01-25 NOTE — Progress Notes (Signed)
 Pt receives out-pt HD at [facility] on TTS 11:00 am chair time. Will assist as needed.   Randine Mungo Dialysis Navigator [phone]

## 2024-01-25 NOTE — Progress Notes (Signed)
 [facility] ROUNDING NOTE   Subjective: Transfused 2 units RBCs yesterday but hemoglobin again down trended this morning at 5.7/17.5 and was transfused another unit at HD with repeat H/H 7.2/21.  Underwent HD without ultrafiltration due to hypotension and was also given dose of midodrine.  Patient otherwise a pretty limited historian.   Objective: Vital signs in last 24 hours: Temp:  [97.6 F (36.4 C)-98.8 F (37.1 C)] 97.6 F (36.4 C) (09/30 1712) Pulse Rate:  [72-96] 90 (09/30 1725) Resp:  [8-16] 15 (09/30 1712) BP: (93-132)/(46-78) 101/46 (09/30 1725) SpO2:  [97 %-100 %] 99 % (09/30 1725) Weight:  [68 kg-68.2 kg] 68 kg (09/30 1135) Last BM Date : 01/24/24 General: NAD Abdomen:  Soft, NT, ND    Intake/Output from previous day: 09/29 0701 - 09/30 0700 In: 1685 [Blood:702.9; IV Piggyback:982.1] Out: -  Intake/Output this shift: Total I/O In: 362 [P.O.:60; Blood:302] Out: 1300 [Other:1300]   Lab Results: Recent Labs    01/25/24 0256 01/25/24 0700 01/25/24 1329  WBC 2.5* 2.2* 4.3  HGB 6.1* 5.7* 7.2*  PLT 103* 95* 111*  MCV 90.2 92.6 88.7   BMET Recent Labs    01/24/24 0535 01/25/24 0257  NA 127* 127*  K 5.7* 5.8*  CL 96* 95*  CO2 22 22  GLUCOSE 128* 91  BUN 97* 100*  CREATININE 6.19* 6.17*  CALCIUM  7.4* 7.2*   LFT Recent Labs    01/25/24 0257  ALBUMIN  <1.5*   PT/INR Recent Labs    01/24/24 2036  INR 1.0      Imaging/Other results: CT ABDOMEN PELVIS WO CONTRAST Result Date: 01/24/2024 CLINICAL DATA:  53 year old male with left lower quadrant abdominal pain. History of lupus, end-stage renal disease, bacterial peritonitis, recurrent ascites. Cirrhosis suspected by imaging recently. EXAM: CT ABDOMEN AND PELVIS WITHOUT CONTRAST TECHNIQUE: Multidetector CT imaging of the abdomen and pelvis was performed following the standard protocol without IV contrast. RADIATION DOSE REDUCTION: This exam was performed according to the departmental  dose-optimization program which includes automated exposure control, adjustment of the mA and/or kV according to patient size and/or use of iterative reconstruction technique. COMPARISON:  CT Abdomen and Pelvis with contrast 12/25/2023 and earlier. FINDINGS: Lower chest: Cardiomegaly. No pericardial effusion. No pleural effusion. Negative lung bases. Hepatobiliary: The gallbladder is more distended now. No convincing acute pericholecystic inflammation. Noncontrast liver appears stable, with a small subcapsular fluid collection along the posterior right hepatic lobe (series 3, image 30) stable to slightly smaller. And that collection appears to have a tenuous communication with a larger peritoneal fluid collection which is described below (series 3, image 44). Pancreas: Negative noncontrast appearance. Spleen: Stable spleen size, upper limits of normal. Punctate calcified splenic granulomas. Adrenals/Urinary Tract: Negative adrenal glands. Moderate bilateral renal atrophy. Nonobstructed kidneys. Chronic pararenal space edema/stranding. Diminutive but thick-walled urinary bladder. Stomach/Bowel: Organized, loculated appearing peritoneal fluid collection now occupying the left abdomen, tracking across midline at the pelvic inlet, and continuing into the right inguinal canal. The largest component of this collection encompasses 122 x 157 x 93 mm (AP by transverse by CC) for an estimated intra-abdominal volume of at least 890 mL. Regional mass effect on bowel and mesentery. No gas within the collection. Background generalized mesenteric and omental edema similar to the August CT. No abnormally dilated bowel loops. Large bowel retained stool and also fairly extensive diverticulosis of the descending and sigmoid colon. Generalized mild bowel wall thickening has not significantly changed from last month. Air in fluid in the stomach. Mostly diminutive  duodenum. Vascular/Lymphatic: Normal caliber abdominal aorta. Blood pool  hypodensity suggesting anemia. Relatively mild Aortoiliac calcified atherosclerosis. Small retroperitoneal lymph nodes appear stable and may be End stage renal disease related. No enlarged or progressive lymph nodes. Reproductive: Loculated peritoneal fluid collection with fairly developed rind of soft tissue tracks into the right inguinal canal, with estimated fluid volume in the canal of 200 mL (series 8, image 7), similar to mildly increased from last month. Superimposed generalized scrotal edema. Other: Presacral stranding, not significantly changed. No layering free fluid in the pelvis. Musculoskeletal: No acute osseous abnormality identified. IMPRESSION: 1. Large, loculated/organized peritoneal fluid collection mostly occupying the left abdomen and midline lower abdomen (volume there at least 890 mL) and tracks into the right inguinal canal, with inguinal/scrotal fluid volume estimated at 200 mL. Also tenuous communication of this collection also with a small right inferior hepatic lobe subcapsular fluid collection which has not progressed from last month. 2. Generalized omental, mesenteric edema or congestion similar to last month. Mild generalized bowel wall thickening, stable, without acute bowel obstruction. 3. Renal atrophy.  Cardiomegaly.  Lung bases are clear. 4.  Aortic Atherosclerosis (ICD10-I70.0). Electronically Signed   By: VEAR Hurst M.D.   On: 01/24/2024 06:35      Assessment and Plan:  1) Hematochezia Initial description and ER was acute onset BRBPR in the setting of straining to have BM.  CT with extensive diverticulosis.  Could have had significant diverticular bleed or possibly hemorrhoidal bleed, although no blood on admission rectal exam.  Received message from Dr. Raenelle that patient had BRBPR earlier this evening. - Stat CTA - Serial H/H checks with additional RBCs as needed per protocol - Depending on CTA results, may need to consider endoscopic evaluation.  This would need to  be discussed with his family as patient is a limited historian and family had previously indicated not wanting to workup his cirrhosis any further and possibly hesitant about endoscopic studies - Continue PPI given chronic steroid use and possibility of PUD  2) Ascites with history of SBP - During last admission, patient and family declined to pursue further workup for potential cirrhosis.  Imaging on this admission without cirrhotic morphology - Has follow-up in the ID clinic on 02/02/2024 to follow-up on recent SBP  3) Constipation - Continue bowel regimen.  Patient previously declined MiraLAX .  Will discuss again pending CT results     Sandor LULLA Flatter, DO  01/25/2024, 5:49 PM [facility] Pager [phone]

## 2024-01-25 NOTE — Progress Notes (Signed)
 Received patient from night hd nurse. Alert and oriented x 3.  Access used.Left avf that worked well.  Duration of treatment:  3.5 hours.  Net uf 1.3 L  Medicine given: Midodrine 10 mg.  Hemo comment: 1 unit of pRBC given:  Hand off to the patient's nurse,back into his room with stable condition via transporter.

## 2024-01-25 NOTE — Progress Notes (Signed)
 [facility] Progress Note   Subjective:   Seen on HD. Getting another 1U blood today, no UF at the moment due to hypotension - will see if can pull after he gets blood. Will given 1X dose midodrine. Denies CP/dyspnea. He doesn't think he was bleeding again last night.  Objective Vitals:   01/25/24 0845 01/25/24 0900 01/25/24 1013 01/25/24 1030  BP:  (!) 99/56 (!) 102/57 101/61  Pulse: 85  87   Resp: (!) 9  (!) 9   Temp:      TempSrc:      SpO2: 99%  100%   Weight:      Height:       Physical Exam General: Frail man, NAD. Room air. Heart: RRR Lungs: CTA anterolaterally Abdomen: Distended, non-tender. Does have significant R scrotal edema. Extremities: 2+ BLE edema Dialysis Access: L forearm AVF +t/b  Additional Objective Labs: Basic Metabolic Panel: Recent Labs  Lab 01/24/24 0535 01/25/24 0257  NA 127* 127*  K 5.7* 5.8*  CL 96* 95*  CO2 22 22  GLUCOSE 128* 91  BUN 97* 100*  CREATININE 6.19* 6.17*  CALCIUM  7.4* 7.2*  PHOS  --  4.6   Liver Function Tests: Recent Labs  Lab 01/25/24 0257  ALBUMIN  <1.5*   CBC: Recent Labs  Lab 01/24/24 0535 01/24/24 2036 01/25/24 0256 01/25/24 0700  WBC 5.6 2.9* 2.5* 2.2*  NEUTROABS 3.7  --   --   --   HGB 5.3* 8.0* 6.1* 5.7*  HCT 16.6* 24.7* 18.4* 17.5*  MCV 98.8 92.2 90.2 92.6  PLT 154 106* 103* 95*   Blood Culture    Component Value Date/Time   SDES PERITONEAL 12/27/2023 1049   SDES PERITONEAL 12/27/2023 1049   SPECREQUEST NONE 12/27/2023 1049   SPECREQUEST NONE 12/27/2023 1049   CULT  12/27/2023 1049    NO GROWTH 3 DAYS Performed at [facility], [address], [place], [place] [address]    CULT  12/27/2023 1049    NO FUNGUS ISOLATED AFTER 21 DAYS Performed at [facility], [address], [place], [place] [address]    REPTSTATUS 12/30/2023 FINAL 12/27/2023 1049   REPTSTATUS 01/17/2024 FINAL 12/27/2023 1049   Studies/Results: CT ABDOMEN PELVIS WO CONTRAST Result Date:  01/24/2024 CLINICAL DATA:  53 year old male with left lower quadrant abdominal pain. History of lupus, end-stage renal disease, bacterial peritonitis, recurrent ascites. Cirrhosis suspected by imaging recently. EXAM: CT ABDOMEN AND PELVIS WITHOUT CONTRAST TECHNIQUE: Multidetector CT imaging of the abdomen and pelvis was performed following the standard protocol without IV contrast. RADIATION DOSE REDUCTION: This exam was performed according to the departmental dose-optimization program which includes automated exposure control, adjustment of the mA and/or kV according to patient size and/or use of iterative reconstruction technique. COMPARISON:  CT Abdomen and Pelvis with contrast 12/25/2023 and earlier. FINDINGS: Lower chest: Cardiomegaly. No pericardial effusion. No pleural effusion. Negative lung bases. Hepatobiliary: The gallbladder is more distended now. No convincing acute pericholecystic inflammation. Noncontrast liver appears stable, with a small subcapsular fluid collection along the posterior right hepatic lobe (series 3, image 30) stable to slightly smaller. And that collection appears to have a tenuous communication with a larger peritoneal fluid collection which is described below (series 3, image 44). Pancreas: Negative noncontrast appearance. Spleen: Stable spleen size, upper limits of normal. Punctate calcified splenic granulomas. Adrenals/Urinary Tract: Negative adrenal glands. Moderate bilateral renal atrophy. Nonobstructed kidneys. Chronic pararenal space edema/stranding. Diminutive but thick-walled urinary bladder. Stomach/Bowel: Organized, loculated appearing peritoneal  fluid collection now occupying the left abdomen, tracking across midline at the pelvic inlet, and continuing into the right inguinal canal. The largest component of this collection encompasses 122 x 157 x 93 mm (AP by transverse by CC) for an estimated intra-abdominal volume of at least 890 mL. Regional mass effect on bowel and  mesentery. No gas within the collection. Background generalized mesenteric and omental edema similar to the August CT. No abnormally dilated bowel loops. Large bowel retained stool and also fairly extensive diverticulosis of the descending and sigmoid colon. Generalized mild bowel wall thickening has not significantly changed from last month. Air in fluid in the stomach. Mostly diminutive duodenum. Vascular/Lymphatic: Normal caliber abdominal aorta. Blood pool hypodensity suggesting anemia. Relatively mild Aortoiliac calcified atherosclerosis. Small retroperitoneal lymph nodes appear stable and may be End stage renal disease related. No enlarged or progressive lymph nodes. Reproductive: Loculated peritoneal fluid collection with fairly developed rind of soft tissue tracks into the right inguinal canal, with estimated fluid volume in the canal of 200 mL (series 8, image 7), similar to mildly increased from last month. Superimposed generalized scrotal edema. Other: Presacral stranding, not significantly changed. No layering free fluid in the pelvis. Musculoskeletal: No acute osseous abnormality identified. IMPRESSION: 1. Large, loculated/organized peritoneal fluid collection mostly occupying the left abdomen and midline lower abdomen (volume there at least 890 mL) and tracks into the right inguinal canal, with inguinal/scrotal fluid volume estimated at 200 mL. Also tenuous communication of this collection also with a small right inferior hepatic lobe subcapsular fluid collection which has not progressed from last month. 2. Generalized omental, mesenteric edema or congestion similar to last month. Mild generalized bowel wall thickening, stable, without acute bowel obstruction. 3. Renal atrophy.  Cardiomegaly.  Lung bases are clear. 4.  Aortic Atherosclerosis (ICD10-I70.0). Electronically Signed   By: VEAR Hurst M.D.   On: 01/24/2024 06:35   Medications:  anticoagulant sodium citrate       sodium chloride    Intravenous  Once   calcium  acetate  667 mg Oral TID AC   Chlorhexidine  Gluconate Cloth  6 each Topical Q0600   folic acid   1 mg Oral Daily   hydroxychloroquine   200 mg Oral Daily   levothyroxine   25 mcg Oral Q0600   multivitamin  1 tablet Oral QHS   pantoprazole  (PROTONIX ) IV  40 mg Intravenous Q12H   predniSONE   20 mg Oral Daily    Dialysis Orders TTS - AF 3:45hr, 400/A1.5, EDW 58kg, 2K/2Ca bath, AVF, no heparin  - Last HD 9/25, left at 65.7kg - up on fluids - Mircera 225mcg IV q 2 weeks - last given 9/16. Last Hgb 8.6 on 9/25 - Hectoral on hold   Assessment/Plan:  BRBPR, ?hemorrhoidal bleeding: Hgb down on admit, s/p 2U PRBCs 9/29, another 1U PRBC today. GI following. No EGD/colo at this time unless bleeds further per notes.  Hyperkalemia: Mild and s/p 1 dose Lokelma . Should resolve with HD today.  ESRD:  Usual TTS schedule, missed last HD. HD today (was supposed to be last night, but rolled over). UF as tolerated.  Hypotension/volume: BP low, does have BLE edema, UF as tolerated. Will give 1X dose midodrine today.  Anemia: See above, due for ESA today - will order.  Metabolic bone disease: CorrCa ok/Phos ok for now. SLE: On Plaquenil  and steroids. Recent peritonitis: S/p IV Cefazolin  and antifungal treatment. CT with loculated fluid going into R scrotum. Defer to primary for evaluation, ?paracentesis.   Izetta Boehringer, PA-C 01/25/2024, 10:37 AM  [facility]

## 2024-01-26 DIAGNOSIS — N186 End stage renal disease: Secondary | ICD-10-CM

## 2024-01-26 DIAGNOSIS — K625 Hemorrhage of anus and rectum: Secondary | ICD-10-CM | POA: Diagnosis not present

## 2024-01-26 DIAGNOSIS — D62 Acute posthemorrhagic anemia: Secondary | ICD-10-CM | POA: Diagnosis not present

## 2024-01-26 DIAGNOSIS — K5900 Constipation, unspecified: Secondary | ICD-10-CM | POA: Diagnosis not present

## 2024-01-26 DIAGNOSIS — Z87898 Personal history of other specified conditions: Secondary | ICD-10-CM | POA: Diagnosis not present

## 2024-01-26 DIAGNOSIS — Z992 Dependence on renal dialysis: Secondary | ICD-10-CM

## 2024-01-26 DIAGNOSIS — R188 Other ascites: Secondary | ICD-10-CM | POA: Diagnosis not present

## 2024-01-26 LAB — CBC
HCT: 28.5 % — ABNORMAL LOW (ref 39.0–52.0)
HCT: 31.4 % — ABNORMAL LOW (ref 39.0–52.0)
Hemoglobin: 9.7 g/dL — ABNORMAL LOW (ref 13.0–17.0)
Hemoglobin: 10.7 g/dL — ABNORMAL LOW (ref 13.0–17.0)
MCH: 30 pg (ref 26.0–34.0)
MCH: 30.2 pg (ref 26.0–34.0)
MCHC: 34 g/dL (ref 30.0–36.0)
MCHC: 34.1 g/dL (ref 30.0–36.0)
MCV: 88.2 fL (ref 80.0–100.0)
MCV: 88.7 fL (ref 80.0–100.0)
Platelets: 122 K/uL — ABNORMAL LOW (ref 150–400)
Platelets: 110 K/uL — ABNORMAL LOW (ref 150–400)
RBC: 3.23 MIL/uL — ABNORMAL LOW (ref 4.22–5.81)
RBC: 3.54 MIL/uL — ABNORMAL LOW (ref 4.22–5.81)
RDW: 17.2 % — ABNORMAL HIGH (ref 11.5–15.5)
RDW: 17.6 % — ABNORMAL HIGH (ref 11.5–15.5)
WBC: 5.8 K/uL (ref 4.0–10.5)
WBC: 11.8 K/uL — ABNORMAL HIGH (ref 4.0–10.5)
nRBC: 0 % (ref 0.0–0.2)
nRBC: 0 % (ref 0.0–0.2)

## 2024-01-26 LAB — RENAL FUNCTION PANEL
Albumin: <1.5 g/dL — ABNORMAL LOW (ref 3.5–5.0)
Anion gap: 8 (ref 5–15)
BUN: 52 mg/dL — ABNORMAL HIGH (ref 6–20)
CO2: 23 mmol/L (ref 22–32)
Calcium: 7.2 mg/dL — ABNORMAL LOW (ref 8.9–10.3)
Chloride: 96 mmol/L — ABNORMAL LOW (ref 98–111)
Creatinine, Ser: 4.07 mg/dL — ABNORMAL HIGH (ref 0.61–1.24)
GFR, Estimated: 17 mL/min — ABNORMAL LOW (ref >60)
Glucose, Bld: 89 mg/dL (ref 70–99)
Phosphorus: 4.2 mg/dL (ref 2.5–4.6)
Potassium: 4.4 mmol/L (ref 3.5–5.1)
Sodium: 127 mmol/L — ABNORMAL LOW (ref 135–145)

## 2024-01-26 LAB — GLUCOSE, CAPILLARY: Glucose-Capillary: 85 mg/dL (ref 70–99)

## 2024-01-26 LAB — HEPATITIS B SURFACE ANTIBODY, QUANTITATIVE: Hep B S AB Quant (Post): 178 m[IU]/mL

## 2024-01-26 MED ORDER — TRAMADOL HCL 50 MG PO TABS
25.0000 mg | ORAL_TABLET | Freq: Two times a day (BID) | ORAL | Status: DC | PRN
  Administered 2024-01-26 – 2024-01-30 (×3): 25 mg via ORAL
  Filled 2024-01-26 (×3): qty 1

## 2024-01-26 MED ORDER — NA SULFATE-K SULFATE-MG SULF 17.5-3.13-1.6 GM/177ML PO SOLN: 0.5000 | Freq: Once | ORAL | Status: DC

## 2024-01-26 MED ORDER — BOOST / RESOURCE BREEZE PO LIQD CUSTOM
1.0000 | Freq: Three times a day (TID) | ORAL | Status: DC
  Administered 2024-01-26 – 2024-01-30 (×5): 1 via ORAL

## 2024-01-26 MED ORDER — NA SULFATE-K SULFATE-MG SULF 17.5-3.13-1.6 GM/177ML PO SOLN
0.5000 | Freq: Once | ORAL | Status: AC
  Administered 2024-01-26: 177 mL via ORAL
  Filled 2024-01-26: qty 1

## 2024-01-26 MED ORDER — SIMETHICONE 80 MG PO CHEW
240.0000 mg | CHEWABLE_TABLET | Freq: Once | ORAL | Status: AC
  Administered 2024-01-26: 240 mg via ORAL
  Filled 2024-01-26: qty 3

## 2024-01-26 MED ORDER — PROCHLORPERAZINE EDISYLATE 10 MG/2ML IJ SOLN
5.0000 mg | Freq: Once | INTRAMUSCULAR | Status: AC
  Administered 2024-01-26: 5 mg via INTRAVENOUS
  Filled 2024-01-26: qty 2

## 2024-01-26 MED ORDER — SIMETHICONE 80 MG PO CHEW
240.0000 mg | CHEWABLE_TABLET | Freq: Once | ORAL | Status: DC
  Filled 2024-01-26: qty 3

## 2024-01-26 MED ORDER — SODIUM CHLORIDE 0.9 % IV SOLN: INTRAVENOUS | Status: AC

## 2024-01-26 NOTE — Progress Notes (Addendum)
 PROGRESS NOTE        PATIENT DETAILS Name: [name] Age: 53 y.o. Sex: male Date of Birth: 1970/06/30 Admit Date: 01/24/2024 Admitting Physician Marsha Ada, MD ERE:[id number], Elsie Sayre, MD  Brief Summary: Patient is a 53 y.o.  male with history of ESRD, ascites without formal diagnosis of liver cirrhosis, SBP (Klebsiella on peritoneal culture on 8/19), chronic HFrEF-presented with lower GI bleeding with acute blood loss anemia.  Significant events: 9/29>> admit to [facility]  Significant studies: 9/29>> CT abdomen/pelvis: Large loculated peritoneal fluid collection left abdomen/midline lower abdomen-tracks into right inguinal canal. 9/30>> CT angio GI bleed study: No extravasation.  Severe diverticulosis.  Significant microbiology data: None  Procedures: None  Consults: Nephrology GI  Subjective: No hematochezia  overnight  Objective: Vitals: Blood pressure (!) 103/47, pulse 95, temperature 98.3 F (36.8 C), temperature source Oral, resp. rate 16, height 5' 11 (1.803 m), weight 68 kg, SpO2 95%.   Exam: Gen Exam:Alert awake-not in any distress HEENT:atraumatic, normocephalic Chest: B/L clear to auscultation anteriorly CVS:S1S2 regular Abdomen:soft non tender, non distended Extremities:++ edema Neurology: Non focal-diffuse generalized weakness. Skin: no rash  Pertinent Labs/Radiology:    Latest Ref Rng & Units 01/26/2024    3:41 AM 01/25/2024    6:30 PM 01/25/2024    1:29 PM  CBC  WBC 4.0 - 10.5 K/uL 5.8  3.6  4.3   Hemoglobin 13.0 - 17.0 g/dL 9.7  6.5  7.2   Hematocrit 39.0 - 52.0 % 28.5  19.3  21.1   Platelets 150 - 400 K/uL 122  92  111     Lab Results  Component Value Date   NA 127 (L) 01/26/2024   K 4.4 01/26/2024   CL 96 (L) 01/26/2024   CO2 23 01/26/2024      Assessment/Plan: Lower GI bleeding with acute blood loss anemia Suspected diverticular bleed-but could be hemorrhoidal as well No hematochezia overnight but  last hematochezia was yesterday afternoon Has required a total of 4 units of PRBC-last transfused 9/13 Hb stable this morning Continue to follow CBC Sister has concerns about patient not being able to get outpatient colonoscopy due to multiple issues (apparently has never had a colonoscopy in the past)-I will alert GI MD. Continue supportive care.   ESRD on HD TTS Nephrology following.  SLE Stable-continue prednisone /Plaquenil .  Chronic ascites-without formal diagnosis of cirrhosis complicated by SBP (Klebsiella 11/2023) CT reveals now a loculated effusion GI has offered workup for cirrhosis in the past-both patient and family has refused. Currently appears stable-no abdominal pain-afebrile.  Chronic HFpEF Some mild lower extremity edema but otherwise stable Continue volume removal with HD  HTN BP soft but stable Coreg  on hold-but on midodrine for BP support.  Hypothyroidism Continue Synthroid   Code status:   Code Status: Full Code   DVT Prophylaxis: SCDs Start: 01/24/24 0753   Family Communication:Sister-Michell [phone]-updated 10/1--has concerns about colonoscopy-timing-that he he may not be able to make it in the outpatient setting.  Unfortunately-I got paged to see another patient-hence call got interrupted-will try to call her later.  Disposition Plan: Status is: Inpatient Remains inpatient appropriate because: Severity of illness   Planned Discharge Destination:Home   Diet: Diet Order             Diet clear liquid Fluid consistency: Thin  Diet effective now  Antimicrobial agents: Anti-infectives (From admission, onward)    Start     Dose/Rate Route Frequency Ordered Stop   01/24/24 1000  hydroxychloroquine  (PLAQUENIL ) tablet 200 mg        200 mg Oral Daily 01/24/24 0833          MEDICATIONS: Scheduled Meds:  sodium chloride    Intravenous Once   calcium  acetate  667 mg Oral TID AC   Chlorhexidine  Gluconate  Cloth  6 each Topical Q0600   darbepoetin (ARANESP ) injection - DIALYSIS  200 mcg Subcutaneous Q Tue-1800   feeding supplement  1 Container Oral TID BM   folic acid   1 mg Oral Daily   hydroxychloroquine   200 mg Oral Daily   levothyroxine   25 mcg Oral Q0600   midodrine  10 mg Oral TID WC   multivitamin  1 tablet Oral QHS   pantoprazole  (PROTONIX ) IV  40 mg Intravenous Q12H   predniSONE   20 mg Oral Daily   Continuous Infusions:   PRN Meds:.   I have personally reviewed following labs and imaging studies  LABORATORY DATA: CBC: Recent Labs  Lab 01/24/24 0535 01/24/24 2036 01/25/24 0256 01/25/24 0700 01/25/24 1329 01/25/24 1830 01/26/24 0341  WBC 5.6   < > 2.5* 2.2* 4.3 3.6* 5.8  NEUTROABS 3.7  --   --   --   --   --   --   HGB 5.3*   < > 6.1* 5.7* 7.2* 6.5* 9.7*  HCT 16.6*   < > 18.4* 17.5* 21.1* 19.3* 28.5*  MCV 98.8   < > 90.2 92.6 88.7 90.2 88.2  PLT 154   < > 103* 95* 111* 92* 122*   < > = values in this interval not displayed.    Basic Metabolic Panel: Recent Labs  Lab 01/24/24 0535 01/25/24 0257 01/26/24 0341  NA 127* 127* 127*  K 5.7* 5.8* 4.4  CL 96* 95* 96*  CO2 22 22 23   GLUCOSE 128* 91 89  BUN 97* 100* 52*  CREATININE 6.19* 6.17* 4.07*  CALCIUM  7.4* 7.2* 7.2*  PHOS  --  4.6 4.2    GFR: Estimated Creatinine Clearance: 20 mL/min (A) (by C-G formula based on SCr of 4.07 mg/dL (H)).  Liver Function Tests: Recent Labs  Lab 01/25/24 0257 01/26/24 0341  ALBUMIN  <1.5* <1.5*   No results for input(s): LIPASE, AMYLASE in the last 168 hours. No results for input(s): AMMONIA in the last 168 hours.  Coagulation Profile: Recent Labs  Lab 01/24/24 2036  INR 1.0    Cardiac Enzymes: No results for input(s): CKTOTAL, CKMB, CKMBINDEX, TROPONINI in the last 168 hours.  BNP (last 3 results) No results for input(s): PROBNP in the last 8760 hours.  Lipid Profile: No results for input(s): CHOL, HDL, LDLCALC, TRIG, CHOLHDL,  LDLDIRECT in the last 72 hours.  Thyroid  Function Tests: No results for input(s): TSH, T4TOTAL, FREET4, T3FREE, THYROIDAB in the last 72 hours.  Anemia Panel: No results for input(s): VITAMINB12, FOLATE, FERRITIN, TIBC, IRON, RETICCTPCT in the last 72 hours.  Urine analysis:    Component Value Date/Time   COLORURINE AMBER (A) 06/29/2021 0425   APPEARANCEUR HAZY (A) 06/29/2021 0425   LABSPEC 1.018 06/29/2021 0425   PHURINE 7.0 06/29/2021 0425   GLUCOSEU NEGATIVE 06/29/2021 0425   HGBUR SMALL (A) 06/29/2021 0425   BILIRUBINUR NEGATIVE 06/29/2021 0425   KETONESUR 5 (A) 06/29/2021 0425   PROTEINUR >=300 (A) 06/29/2021 0425   NITRITE NEGATIVE 06/29/2021 0425   LEUKOCYTESUR NEGATIVE 06/29/2021 0425  Sepsis Labs: Lactic Acid, Venous    Component Value Date/Time   LATICACIDVEN 2.0 (HH) 12/14/2023 1623    MICROBIOLOGY: No results found for this or any previous visit (from the past 240 hours).  RADIOLOGY STUDIES/RESULTS: CT ANGIO GI BLEED Result Date: 01/25/2024 EXAM: CTA ABDOMEN AND PELVIS WITH CONTRAST 01/25/2024 07:35:00 PM TECHNIQUE: CTA images of the abdomen and pelvis with and without intravenous contrast. Three-dimensional MIP/volume rendered formations were performed. Automated exposure control, iterative reconstruction, and/or weight based adjustment of the mA/kV was utilized to reduce the radiation dose to as low as reasonably achievable. COMPARISON: CT abdomen and pelvis 02/29/2025. CLINICAL HISTORY: Aneurysm, renal or visceral; lower GI bleed-please assess for active extravasation. CT ANGIO GI BLEED; Aneurysm, renal or visceral, lower GI bleed-please assess for active extravasation. Omni 350 70mL. FINDINGS: VASCULATURE: GI BLEED: No evidence of active GI bleeding. AORTA: No acute finding. No abdominal aortic aneurysm. No dissection. CELIAC TRUNK: No acute finding. No occlusion or significant stenosis. SUPERIOR MESENTERIC ARTERY: No acute finding. No  occlusion or significant stenosis. INFERIOR MESENTERIC ARTERY: No acute finding. No occlusion or significant stenosis. RENAL ARTERIES: No acute finding. No occlusion or significant stenosis. ILIAC ARTERIES: No acute finding. No occlusion or significant stenosis. ABDOMEN/PELVIS: LOWER CHEST: Visualized portion of the lower chest demonstrates no acute abnormality. LIVER: Cirrhotic liver morphology. Unchanged small subcapsular fluid collection along the posterior right hepatic lobe; there is again a tenuous connection with the large peritoneal fluid collection described below. GALLBLADDER AND BILE DUCTS: Gallbladder is unremarkable. No biliary ductal dilatation. SPLEEN: The spleen is unremarkable. PANCREAS: The pancreas is unremarkable. ADRENAL GLANDS: Bilateral adrenal glands demonstrate no acute abnormality. KIDNEYS, URETERS AND BLADDER: Bilateral cortical renal atrophy. Chronic nonspecific perinephric stranding. Bladder wall thickening. No stones in the kidneys or ureters. No hydronephrosis. GI AND BOWEL: Mild diffuse gastric wall thickening. Large colonic stool burden. Additional wall thickening of the proximal jejunum near the ligament of Treitz. Wall thickening of the rectum with fat stranding and edema. Extensive colonic diverticulosis. There is no bowel obstruction. No evidence of active bleeding. REPRODUCTIVE: Scrotal edema. PERITONEUM AND RETROPERITONEUM: Thick-walled organized collection extending from the inferior surface of the liver into the low anterior abdomen and right inguinal canal/scrotum is unchanged. Similar mass effect on the adjacent small bowel and mesentery. Mesenteric and omental edema. Similar free fluid in the abdomen and pelvis. No free intraperitoneal air. LYMPH NODES: Similar shotty retroperitoneal lymph nodes. BONES AND SOFT TISSUES: Avascular necrosis of both femoral heads. Body wall anasarca and scrotal edema. No acute abnormality of the bones. No acute soft tissue abnormality.  IMPRESSION: 1. No active gastrointestinal bleeding. 2. Wall thickening of the stomach, jejunum near the ligament of treitz, and rectum are favored infectious/inflammatory though portal congestive Gastropathy/enteropathy/colopathy could appear similarly. 3. Extensive colonic diverticulosis with large colonic stool burden, without evidence of diverticulitis. 4. Bladder wall thickening and nonspecific perinephric stranding. Correlate for cystitis/pyelonephritis. Electronically signed by: Norman Gatlin MD 01/25/2024 08:11 PM EDT RP Workstation: [id number]     LOS: 2 days   Donalda Applebaum, MD  [facility]    To contact the attending provider between 7A-7P or the covering provider during after hours 7P-7A, please log into the web site [email or web address] and access using universal [place] password for that web site. If you do not have the password, please call the hospital operator.  01/26/2024, 10:02 AM

## 2024-01-26 NOTE — TOC Initial Note (Signed)
 Transition of Care [facility]) - Initial/Assessment Note    Patient Details  Name: [name] MRN: [id number] Date of Birth: [birth year]  Transition of Care [facility]) CM/SW Contact:    Marval Gell, RN Phone Number: 01/26/2024, 8:24 AM  Clinical Narrative:                  Patient admitted from home with LGIB, acute blood loss anemia. Hgb improved after blood transfusions.  Receives HD TTS . From home alone.  No ICM needs identified at this time, will continue to follow through progression rounds.   Expected Discharge Plan: Home/Self Care Barriers to Discharge: Continued Medical Work up   Patient Goals and CMS Choice Patient states their goals for this hospitalization and ongoing recovery are:: to go home          Expected Discharge Plan and Services   Discharge Planning Services: CM Consult   Living arrangements for the past 2 months: Single Family Home                 DME Arranged: N/A         HH Arranged: NA          Prior Living Arrangements/Services Living arrangements for the past 2 months: Single Family Home Lives with:: Self                   Activities of Daily Living   ADL Screening (condition at time of admission) Is the patient deaf or have difficulty hearing?: No Does the patient have difficulty seeing, even when wearing glasses/contacts?: No Does the patient have difficulty concentrating, remembering, or making decisions?: No  Permission Sought/Granted                  Emotional Assessment              Admission diagnosis:  Hyperkalemia [E87.5] Hyponatremia [E87.1] GIB (gastrointestinal bleeding) [K92.2] Anemia associated with acute blood loss [D62] End-stage renal disease on hemodialysis (HCC) [N18.6, Z99.2] Hypotension due to hypovolemia [E86.1] Patient Active Problem List   Diagnosis Date Noted   Hypotension due to hypovolemia 01/25/2024   Anemia associated with acute blood loss 01/25/2024   End-stage renal disease on  hemodialysis (HCC) 01/25/2024   Bright red blood per rectum 01/24/2024   History of ascites 01/24/2024   ABLA (acute blood loss anemia) 01/24/2024   Constipation 01/24/2024   Cirrhosis of liver with ascites (HCC) 12/31/2023   Spontaneous bacterial peritonitis (HCC) 12/14/2023   Hyperkalemia 12/14/2023   Pericardial effusion 12/01/2021   Ascites 10/01/2020   Disorder of kidney and ureter, unspecified 10/01/2020   Essential hypertension 10/01/2020   Inflammatory arthritis 10/01/2020   Lupus nephritis (HCC) 10/01/2020   Thrombocytopenia 10/01/2020   ESRD on dialysis (HCC) 07/03/2020   Weight loss 07/03/2020   Malnutrition of moderate degree 05/01/2020   Acquired hypothyroidism 11/27/2019   Cardiac murmur 11/24/2019   Chronic systolic CHF (congestive heart failure) (HCC) 11/06/2019   Pancytopenia (HCC) 02/06/2019   Alcohol use 02/06/2019   Elevated LFTs 02/06/2019   Vitamin D  deficiency 12/05/2018   False positive interferon-gamma release assay (IGRA) for tuberculosis 12/05/2018   Neurotic excoriations 11/01/2018   Polyarthralgia 06/11/2015   Systemic lupus erythematosus with lung involvement (HCC) 03/09/2015   PCP:  Berneta Elsie Sayre, MD Pharmacy:   [facility] [address] [place] - [address] [place] [address] Phone: [phone] Fax: [phone]  [place] [facility]  Pharmacy [address] [place] [address] Phone: [phone] Fax: [phone]     Social Drivers of Health (SDOH) Social History: SDOH Screenings   Food Insecurity: No Food Insecurity (01/24/2024)  Housing: Unknown (01/24/2024)  Transportation Needs: No Transportation Needs (01/24/2024)  Utilities: Not At Risk (01/24/2024)  Depression (PHQ2-9): Low Risk  (01/02/2022)  Tobacco Use: Low Risk  (12/14/2023)   SDOH Interventions:     Readmission Risk Interventions    01/03/2024   11:13 AM  Readmission Risk Prevention Plan   Transportation Screening Complete  HRI or Home Care Consult Complete  Social Work Consult for Recovery Care Planning/Counseling Complete  Palliative Care Screening Not Applicable  Medication Review Oceanographer) Referral to Pharmacy

## 2024-01-26 NOTE — Plan of Care (Signed)
   Problem: Clinical Measurements: Goal: Ability to maintain clinical measurements within normal limits will improve Outcome: Progressing Goal: Will remain free from infection Outcome: Progressing Goal: Diagnostic test results will improve Outcome: Progressing Goal: Respiratory complications will improve Outcome: Progressing

## 2024-01-26 NOTE — Progress Notes (Addendum)
 Daily Progress Note  DOA: 01/24/2024 Hospital Day: 3  Cc:  GI bleed, anemia  ASSESSMENT    53 yo male admitted with GI bleeding ( [facility]BPR) in setting of constipation / straining. Differential diagnosis :  hemorrhoids, diverticular bleeding, AVMs. No mass lesions on CT scan.  No active bleeding on CT angio.  TODAY:  Received another unit RBCs during the night. Hgb 6.5 >> 9.7 with just one unit. Suspect some downward drift with equilibration  ESRD on HD TTS  Chronic ascites thought to be 2/2 to hypoalbuminemia though imaging studies have suggested cirrhosis. History of SBP ( Klebsiella on culture in August) Non -contrast CT scan this showing a small subcapsular fluid collection along the posterior right hepatic lobe, stable to slightly smaller. That collection appears to have a tenuous communication with a larger peritoneal fluid collection occupying the left abdomen and midline lower abdomen and tracking into the right inguinal canal, with inguinal/scrotal fluid. CT angio demonstrating cirrhotic morphology of liver.   HFrEF EF 55-60 % on echo Aug 2025  Hypothyroidism  Principal Problem:   Bright red blood per rectum Active Problems:   History of ascites   ABLA (acute blood loss anemia)   Constipation   Hypotension due to hypovolemia   Anemia associated with acute blood loss   End-stage renal disease on hemodialysis [facility])   PLAN   --Long discussion in the room with patient and sister Rosaline [facility]) about a colonoscopy. The risks and benefits of colonoscopy with possible polypectomy / biopsies were discussed and the patient agrees to proceed.  --Discussed EGD at time of colonoscopy to screen for esophageal varices since there is some chance he could have cirrhosis.  Patient apparently underwent extensive workup for portal hypertension a few years ago and at that time there was no evidence for liver disease. Given this, patient / sister do not want another extensive workup  to evaluate for liver disease. We discussed that the EGD is to screen for portal hypertensive changes in stomach / esophagus (such as varices) that could potentially have a negative impact on his health in the future. After discussion of risks and benefits the patient and his family would like to proceed with EGD --Will leave prn order for flexiseal to help with bowel prep / cleaning as patient is incontinent.  --Will contact Dialysis and let them know that we are planning procedures tomorrow around noon.    Subjective   Patient is not aware of any ongoing GI bleeding today   Objective   GI Studies:    Recent Labs    01/25/24 1329 01/25/24 1830 01/26/24 0341  WBC 4.3 3.6* 5.8  HGB 7.2* 6.5* 9.7*  HCT 21.1* 19.3* 28.5*  MCV 88.7 90.2 88.2  PLT 111* 92* 122*   No results for input(s): FOLATE, VITAMINB12, FERRITIN, TIBC, IRONPCTSAT in the last 72 hours. Recent Labs    01/24/24 0535 01/25/24 0257 01/26/24 0341  NA 127* 127* 127*  K 5.7* 5.8* 4.4  CL 96* 95* 96*  CO2 22 22 23   GLUCOSE 128* 91 89  BUN 97* 100* 52*  CREATININE 6.19* 6.17* 4.07*  CALCIUM  7.4* 7.2* 7.2*   Recent Labs    01/25/24 0257 01/26/24 0341  ALBUMIN  <1.5* <1.5*    Imaging:  CT ANGIO GI BLEED EXAM: CTA ABDOMEN AND PELVIS WITH CONTRAST 01/25/2024 07:35:00 PM  TECHNIQUE: CTA images of the abdomen and pelvis with and without intravenous contrast. Three-dimensional MIP/volume rendered formations were performed. Automated  exposure control, iterative reconstruction, and/or weight based adjustment of the mA/kV was utilized to reduce the radiation dose to as low as reasonably achievable.  COMPARISON: CT abdomen and pelvis 02/29/2025.  CLINICAL HISTORY: Aneurysm, renal or visceral; lower GI bleed-please assess for active extravasation. CT ANGIO GI BLEED; Aneurysm, renal or visceral, lower GI bleed-please assess for active extravasation. Omni 350 70mL.  FINDINGS:  VASCULATURE: GI  BLEED: No evidence of active GI bleeding.  AORTA: No acute finding. No abdominal aortic aneurysm. No dissection.  CELIAC TRUNK: No acute finding. No occlusion or significant stenosis.  SUPERIOR MESENTERIC ARTERY: No acute finding. No occlusion or significant stenosis.  INFERIOR MESENTERIC ARTERY: No acute finding. No occlusion or significant stenosis.  RENAL ARTERIES: No acute finding. No occlusion or significant stenosis.  ILIAC ARTERIES: No acute finding. No occlusion or significant stenosis.  ABDOMEN/PELVIS: LOWER CHEST: Visualized portion of the lower chest demonstrates no acute abnormality.  LIVER: Cirrhotic liver morphology. Unchanged small subcapsular fluid collection along the posterior right hepatic lobe; there is again a tenuous connection with the large peritoneal fluid collection described below.  GALLBLADDER AND BILE DUCTS: Gallbladder is unremarkable. No biliary ductal dilatation.  SPLEEN: The spleen is unremarkable.  PANCREAS: The pancreas is unremarkable.  ADRENAL GLANDS: Bilateral adrenal glands demonstrate no acute abnormality.  KIDNEYS, URETERS AND BLADDER: Bilateral cortical renal atrophy. Chronic nonspecific perinephric stranding. Bladder wall thickening. No stones in the kidneys or ureters. No hydronephrosis.  GI AND BOWEL: Mild diffuse gastric wall thickening. Large colonic stool burden. Additional wall thickening of the proximal jejunum near the ligament of Treitz. Wall thickening of the rectum with fat stranding and edema. Extensive colonic diverticulosis. There is no bowel obstruction. No evidence of active bleeding.  REPRODUCTIVE: Scrotal edema.  PERITONEUM AND RETROPERITONEUM: Thick-walled organized collection extending from the inferior surface of the liver into the low anterior abdomen and right inguinal canal/scrotum is unchanged. Similar mass effect on the adjacent small bowel and mesentery. Mesenteric and omental edema.  Similar free fluid in the abdomen and pelvis. No free intraperitoneal air.  LYMPH NODES: Similar shotty retroperitoneal lymph nodes.  BONES AND SOFT TISSUES: Avascular necrosis of both femoral heads. Body wall anasarca and scrotal edema. No acute abnormality of the bones. No acute soft tissue abnormality.  IMPRESSION: 1. No active gastrointestinal bleeding. 2. Wall thickening of the stomach, jejunum near the ligament of treitz, and rectum are favored infectious/inflammatory though portal congestive Gastropathy/enteropathy/colopathy could appear similarly. 3. Extensive colonic diverticulosis with large colonic stool burden, without evidence of diverticulitis. 4. Bladder wall thickening and nonspecific perinephric stranding. Correlate for cystitis/pyelonephritis.  Electronically signed by: Norman Gatlin MD 01/25/2024 08:11 PM EDT RP Workstation: [id number]     Scheduled inpatient medications:   sodium chloride    Intravenous Once   calcium  acetate  667 mg Oral TID AC   Chlorhexidine  Gluconate Cloth  6 each Topical Q0600   darbepoetin (ARANESP ) injection - DIALYSIS  200 mcg Subcutaneous Q Tue-1800   feeding supplement  1 Container Oral TID BM   folic acid   1 mg Oral Daily   hydroxychloroquine   200 mg Oral Daily   levothyroxine   25 mcg Oral Q0600   midodrine  10 mg Oral TID WC   multivitamin  1 tablet Oral QHS   pantoprazole  (PROTONIX ) IV  40 mg Intravenous Q12H   predniSONE   20 mg Oral Daily   Continuous inpatient infusions:  PRN inpatient medications: traMADol   Vital signs in last 24 hours: Temp:  [97.6 F (36.4 C)-99.1 F (37.3  C)] 97.6 F (36.4 C) (10/01 1200) Pulse Rate:  [72-95] 89 (10/01 1200) Resp:  [9-20] 9 (10/01 1200) BP: (97-132)/(46-65) 102/50 (10/01 1200) SpO2:  [95 %-100 %] 97 % (10/01 1200) Last BM Date : 01/24/24  Intake/Output Summary (Last 24 hours) at 01/26/2024 1434 Last data filed at 01/26/2024 1200 Gross per 24 hour  Intake 772 ml  Output 0 ml   Net 772 ml    Intake/Output from previous day: 09/30 0701 - 10/01 0700 In: 1034 [P.O.:60; Blood:974] Out: 1300  Intake/Output this shift: Total I/O In: 40 [P.O.:40] Out: 0    Physical Exam:  General: Alert thin male in NAD Heart:  Regular rate and rhythm.  Pulmonary: Normal respiratory effort. CTA anteriorly  Abdomen: Soft, nondistended, nontender. Normal bowel sounds. Extremities: 2+ BLE  edema  Neurologic: Alert and oriented Psych: Pleasant. Cooperative   LOS: 2 days   Vina Dasen ,NP 01/26/2024, 2:34 PM    Attending physician's note   I have reviewed the chart and discussed his care on rounds. I performed a substantive portion of this encounter, including complete performance of at least one of the key components, in conjunction with the APP. I agree with the APP's note, impression, and recommendations with my edits.   Plan for EGD and colonoscopy tomorrow.  Bowel prep ordered along with Flexi-Seal due to limited mobility.  Coordinating endoscopy timing with dialysis session.  Will update patient's sister, Rosaline, after procedures.  [address], DO, FACG [phone] office

## 2024-01-26 NOTE — Plan of Care (Signed)
  Problem: Education: Goal: Knowledge of General Education information will improve Description: Including pain rating scale, medication(s)/side effects and non-pharmacologic comfort measures Outcome: Progressing   Problem: Health Behavior/Discharge Planning: Goal: Ability to manage health-related needs will improve Outcome: Progressing   Problem: Clinical Measurements: Goal: Ability to maintain clinical measurements within normal limits will improve Outcome: Progressing Goal: Will remain free from infection Outcome: Progressing Goal: Respiratory complications will improve Outcome: Progressing Goal: Cardiovascular complication will be avoided Outcome: Progressing   Problem: Activity: Goal: Risk for activity intolerance will decrease Outcome: Progressing   Problem: Nutrition: Goal: Adequate nutrition will be maintained Outcome: Progressing   Problem: Coping: Goal: Level of anxiety will decrease Outcome: Progressing   Problem: Elimination: Goal: Will not experience complications related to bowel motility Outcome: Progressing Goal: Will not experience complications related to urinary retention Outcome: Progressing   Problem: Pain Managment: Goal: General experience of comfort will improve and/or be controlled Outcome: Progressing   Problem: Safety: Goal: Ability to remain free from injury will improve Outcome: Progressing   Problem: Skin Integrity: Goal: Risk for impaired skin integrity will decrease Outcome: Progressing

## 2024-01-26 NOTE — Progress Notes (Signed)
  [facility] Progress Note   Subjective:    Completed HD Tues - net UF 1.3L. pRBCs transfused with HD  Hgb 9.7 Endorses abd pain. No sob/cp.  Disoriented to days/times of events.   Objective Vitals:   01/26/24 0000 01/26/24 0151 01/26/24 0400 01/26/24 0737  BP: (!) 120/59 124/63 (!) 122/57 (!) 103/47  Pulse: 72 86 75 95  Resp: 15 17 20 16   Temp:  98.2 F (36.8 C) 97.7 F (36.5 C) 98.3 F (36.8 C)  TempSrc:  Oral Oral Oral  SpO2: 96% 97% 95% 95%  Weight:      Height:       Physical Exam General: Chronically ill appearing, frail, on room air  Heart: RRR Lungs: CTA anterolaterally Abdomen: Distended, non-tender.  Extremities: 1+ BLE edema Dialysis Access: L forearm AVF +t/b  Additional Objective Labs: Basic Metabolic Panel: Recent Labs  Lab 01/24/24 0535 01/25/24 0257 01/26/24 0341  NA 127* 127* 127*  K 5.7* 5.8* 4.4  CL 96* 95* 96*  CO2 22 22 23   GLUCOSE 128* 91 89  BUN 97* 100* 52*  CREATININE 6.19* 6.17* 4.07*  CALCIUM  7.4* 7.2* 7.2*  PHOS  --  4.6 4.2   Liver Function Tests: Recent Labs  Lab 01/25/24 0257 01/26/24 0341  ALBUMIN  <1.5* <1.5*   CBC: Recent Labs  Lab 01/24/24 0535 01/24/24 2036 01/25/24 0256 01/25/24 0700 01/25/24 1329 01/25/24 1830 01/26/24 0341  WBC 5.6   < > 2.5* 2.2* 4.3 3.6* 5.8  NEUTROABS 3.7  --   --   --   --   --   --   HGB 5.3*   < > 6.1* 5.7* 7.2* 6.5* 9.7*  HCT 16.6*   < > 18.4* 17.5* 21.1* 19.3* 28.5*  MCV 98.8   < > 90.2 92.6 88.7 90.2 88.2  PLT 154   < > 103* 95* 111* 92* 122*   < > = values in this interval not displayed.   Blood Culture    Component Value Date/Time   SDES PERITONEAL 12/27/2023 1049   SDES PERITONEAL 12/27/2023 1049   SPECREQUEST NONE 12/27/2023 1049   SPECREQUEST NONE 12/27/2023 1049   CULT  12/27/2023 1049    NO GROWTH 3 DAYS Performed at [facility], [address], [place], [place] [address]    CULT  12/27/2023 1049    NO FUNGUS ISOLATED AFTER 21  DAYS Performed at [facility], [address] [place], [place] [address]    REPTSTATUS 12/30/2023 FINAL 12/27/2023 1049   REPTSTATUS 01/17/2024 FINAL 12/27/2023 1049   Studies/Results: CT ANGIO GI BLEED Result Date: 01/25/2024 EXAM: CTA ABDOMEN AND PELVIS WITH CONTRAST 01/25/2024 07:35:00 PM TECHNIQUE: CTA images of the abdomen and pelvis with and without intravenous contrast. Three-dimensional MIP/volume rendered formations were performed. Automated exposure control, iterative reconstruction, and/or weight based adjustment of the mA/kV was utilized to reduce the radiation dose to as low as reasonably achievable. COMPARISON: CT abdomen and pelvis 02/29/2025. CLINICAL HISTORY: Aneurysm, renal or visceral; lower GI bleed-please assess for active extravasation. CT ANGIO GI BLEED; Aneurysm, renal or visceral, lower GI bleed-please assess for active extravasation. Omni 350 70mL. FINDINGS: VASCULATURE: GI BLEED: No evidence of active GI bleeding. AORTA: No acute finding. No abdominal aortic aneurysm. No dissection. CELIAC TRUNK: No acute finding. No occlusion or significant stenosis. SUPERIOR MESENTERIC ARTERY: No acute finding. No occlusion or significant stenosis. INFERIOR MESENTERIC ARTERY: No acute finding. No occlusion or significant stenosis. RENAL ARTERIES: No acute finding. No  occlusion or significant stenosis. ILIAC ARTERIES: No acute finding. No occlusion or significant stenosis. ABDOMEN/PELVIS: LOWER CHEST: Visualized portion of the lower chest demonstrates no acute abnormality. LIVER: Cirrhotic liver morphology. Unchanged small subcapsular fluid collection along the posterior right hepatic lobe; there is again a tenuous connection with the large peritoneal fluid collection described below. GALLBLADDER AND BILE DUCTS: Gallbladder is unremarkable. No biliary ductal dilatation. SPLEEN: The spleen is unremarkable. PANCREAS: The pancreas is unremarkable. ADRENAL GLANDS: Bilateral adrenal glands  demonstrate no acute abnormality. KIDNEYS, URETERS AND BLADDER: Bilateral cortical renal atrophy. Chronic nonspecific perinephric stranding. Bladder wall thickening. No stones in the kidneys or ureters. No hydronephrosis. GI AND BOWEL: Mild diffuse gastric wall thickening. Large colonic stool burden. Additional wall thickening of the proximal jejunum near the ligament of Treitz. Wall thickening of the rectum with fat stranding and edema. Extensive colonic diverticulosis. There is no bowel obstruction. No evidence of active bleeding. REPRODUCTIVE: Scrotal edema. PERITONEUM AND RETROPERITONEUM: Thick-walled organized collection extending from the inferior surface of the liver into the low anterior abdomen and right inguinal canal/scrotum is unchanged. Similar mass effect on the adjacent small bowel and mesentery. Mesenteric and omental edema. Similar free fluid in the abdomen and pelvis. No free intraperitoneal air. LYMPH NODES: Similar shotty retroperitoneal lymph nodes. BONES AND SOFT TISSUES: Avascular necrosis of both femoral heads. Body wall anasarca and scrotal edema. No acute abnormality of the bones. No acute soft tissue abnormality. IMPRESSION: 1. No active gastrointestinal bleeding. 2. Wall thickening of the stomach, jejunum near the ligament of treitz, and rectum are favored infectious/inflammatory though portal congestive Gastropathy/enteropathy/colopathy could appear similarly. 3. Extensive colonic diverticulosis with large colonic stool burden, without evidence of diverticulitis. 4. Bladder wall thickening and nonspecific perinephric stranding. Correlate for cystitis/pyelonephritis. Electronically signed by: Norman Gatlin MD 01/25/2024 08:11 PM EDT RP Workstation: [id number]   Medications:    sodium chloride    Intravenous Once   calcium  acetate  667 mg Oral TID AC   Chlorhexidine  Gluconate Cloth  6 each Topical Q0600   darbepoetin (ARANESP ) injection - DIALYSIS  200 mcg Subcutaneous Q Tue-1800    feeding supplement  1 Container Oral TID BM   folic acid   1 mg Oral Daily   hydroxychloroquine   200 mg Oral Daily   levothyroxine   25 mcg Oral Q0600   midodrine  10 mg Oral TID WC   multivitamin  1 tablet Oral QHS   pantoprazole  (PROTONIX ) IV  40 mg Intravenous Q12H   predniSONE   20 mg Oral Daily    Dialysis Orders TTS - AF 3:45hr, 400/A1.5, EDW 58kg, 2K/2Ca bath, AVF, no heparin  - Last HD 9/25, left at 65.7kg - up on fluids - Mircera 225mcg IV q 2 weeks - last given 9/16. Last Hgb 8.6 on 9/25 - Hectoral on hold   Assessment/Plan: BRBPR, ?hemorrhoidal bleeding: Hgb down on admit, s/p 2U PRBCs 9/29, 1U PRBC 9/30. GI following. No EGD/colo at this time unless bleeds further per notes. Hyperkalemia: Resolved  ESRD:  Usual TTS schedule. HD 10/2.  Hypotension/volume: BP soft. Does have some edema. UF as tolerated. On midodrine 10 mg TID  Anemia: Received Aranesp  200 on 9/30. Continue weekly  Metabolic bone disease: CorrCa ok/Phos ok for now. SLE: On Plaquenil  and steroids. Recurrent ascites c/b recent peritonitis.CT with loculated fluid going into R scrotum. Defer to primary    Maisie Ronnald Acosta PA-C [facility] 01/26/2024,9:03 AM

## 2024-01-27 ENCOUNTER — Ambulatory Visit: Payer: Self-pay | Admitting: Internal Medicine

## 2024-01-27 ENCOUNTER — Encounter (HOSPITAL_COMMUNITY)
Admission: EM | Disposition: A | Discharge status: Home Health | Payer: Self-pay | Source: Home / Self Care | Attending: Internal Medicine

## 2024-01-27 DIAGNOSIS — N186 End stage renal disease: Secondary | ICD-10-CM | POA: Diagnosis not present

## 2024-01-27 DIAGNOSIS — Z992 Dependence on renal dialysis: Secondary | ICD-10-CM | POA: Diagnosis not present

## 2024-01-27 DIAGNOSIS — K625 Hemorrhage of anus and rectum: Secondary | ICD-10-CM | POA: Diagnosis not present

## 2024-01-27 DIAGNOSIS — D62 Acute posthemorrhagic anemia: Secondary | ICD-10-CM | POA: Diagnosis not present

## 2024-01-27 DIAGNOSIS — R188 Other ascites: Secondary | ICD-10-CM | POA: Diagnosis not present

## 2024-01-27 LAB — BPAM RBC
Blood Product Expiration Date: 202510262359
Blood Product Expiration Date: 202510262359
Blood Product Expiration Date: 202510262359
Blood Product Expiration Date: 202510302359
Blood Product Expiration Date: 202510302359
Blood Product Expiration Date: 202510302359
Blood Product Unit Number: 202510262359
ISSUE DATE / TIME: 202509290723
ISSUE DATE / TIME: 202509290912
ISSUE DATE / TIME: 202509300728
ISSUE DATE / TIME: 202509302250
PRODUCT CODE: 202509301949
PRODUCT CODE: 202510262359
Unit Type and Rh: 202510262359
Unit Type and Rh: 5100
Unit Type and Rh: 5100
Unit Type and Rh: 5100
Unit Type and Rh: 5100
Unit Type and Rh: 5100
Unit Type and Rh: 5100
Unit Type and Rh: 5100
Unit Type and Rh: 5100

## 2024-01-27 LAB — RENAL FUNCTION PANEL
Albumin: <1.5 g/dL — ABNORMAL LOW (ref 3.5–5.0)
Anion gap: 17 — ABNORMAL HIGH (ref 5–15)
BUN: 63 mg/dL — ABNORMAL HIGH (ref 6–20)
CO2: 22 mmol/L (ref 22–32)
Calcium: 7.4 mg/dL — ABNORMAL LOW (ref 8.9–10.3)
Chloride: 92 mmol/L — ABNORMAL LOW (ref 98–111)
Creatinine, Ser: 4.98 mg/dL — ABNORMAL HIGH (ref 0.61–1.24)
GFR, Estimated: 13 mL/min — ABNORMAL LOW (ref >60)
Glucose, Bld: 108 mg/dL — ABNORMAL HIGH (ref 70–99)
Phosphorus: 4.5 mg/dL (ref 2.5–4.6)
Potassium: 4.3 mmol/L (ref 3.5–5.1)
Sodium: 131 mmol/L — ABNORMAL LOW (ref 135–145)

## 2024-01-27 LAB — TYPE AND SCREEN
ABO/RH(D): O POS
Antibody Screen: NEGATIVE
Unit division: 0
Unit division: 0
Unit division: 0
Unit division: 0
Unit division: 0
Unit division: 0
Unit division: 0

## 2024-01-27 LAB — CBC
HCT: 28 % — ABNORMAL LOW (ref 39.0–52.0)
HCT: 25.2 % — ABNORMAL LOW (ref 39.0–52.0)
Hemoglobin: 9.4 g/dL — ABNORMAL LOW (ref 13.0–17.0)
Hemoglobin: 8.5 g/dL — ABNORMAL LOW (ref 13.0–17.0)
MCH: 29.7 pg (ref 26.0–34.0)
MCH: 29.9 pg (ref 26.0–34.0)
MCHC: 33.6 g/dL (ref 30.0–36.0)
MCHC: 33.7 g/dL (ref 30.0–36.0)
MCV: 88.3 fL (ref 80.0–100.0)
MCV: 88.7 fL (ref 80.0–100.0)
Platelets: 100 K/uL — ABNORMAL LOW (ref 150–400)
Platelets: 89 K/uL — ABNORMAL LOW (ref 150–400)
RBC: 3.17 MIL/uL — ABNORMAL LOW (ref 4.22–5.81)
RBC: 2.84 MIL/uL — ABNORMAL LOW (ref 4.22–5.81)
RDW: 17.6 % — ABNORMAL HIGH (ref 11.5–15.5)
RDW: 17.3 % — ABNORMAL HIGH (ref 11.5–15.5)
WBC: 11.7 K/uL — ABNORMAL HIGH (ref 4.0–10.5)
WBC: 14.2 K/uL — ABNORMAL HIGH (ref 4.0–10.5)
nRBC: 0 % (ref 0.0–0.2)
nRBC: 0 % (ref 0.0–0.2)

## 2024-01-27 LAB — PREPARE RBC (CROSSMATCH)

## 2024-01-27 SURGERY — COLONOSCOPY
Anesthesia: Monitor Anesthesia Care

## 2024-01-27 MED ORDER — SIMETHICONE 80 MG PO CHEW
240.0000 mg | CHEWABLE_TABLET | Freq: Once | ORAL | Status: AC
  Administered 2024-01-27: 240 mg via ORAL
  Filled 2024-01-27: qty 3

## 2024-01-27 MED ORDER — NA SULFATE-K SULFATE-MG SULF 17.5-3.13-1.6 GM/177ML PO SOLN
0.5000 | Freq: Once | ORAL | Status: DC
  Filled 2024-01-27: qty 1

## 2024-01-27 MED ORDER — SODIUM CHLORIDE 0.9% IV SOLUTION: Freq: Once | INTRAVENOUS | Status: AC

## 2024-01-27 MED ORDER — NA SULFATE-K SULFATE-MG SULF 17.5-3.13-1.6 GM/177ML PO SOLN
0.5000 | Freq: Once | ORAL | Status: AC
  Administered 2024-01-27: 177 mL via ORAL
  Filled 2024-01-27: qty 1

## 2024-01-27 MED ORDER — SODIUM CHLORIDE 0.9 % IV SOLN: INTRAVENOUS | Status: AC

## 2024-01-27 MED ORDER — DIPHENHYDRAMINE HCL 25 MG PO CAPS
ORAL_CAPSULE | ORAL | Status: AC
  Filled 2024-01-27: qty 1

## 2024-01-27 MED ORDER — MIDODRINE HCL 5 MG PO TABS
ORAL_TABLET | ORAL | Status: AC
  Filled 2024-01-27: qty 2

## 2024-01-27 MED ORDER — SIMETHICONE 80 MG PO CHEW: 240.0000 mg | CHEWABLE_TABLET | Freq: Once | ORAL | Status: DC

## 2024-01-27 MED ORDER — ACETAMINOPHEN 325 MG PO TABS
650.0000 mg | ORAL_TABLET | Freq: Once | ORAL | Status: AC
  Administered 2024-01-27: 650 mg via ORAL

## 2024-01-27 MED ORDER — ALBUMIN HUMAN 25 % IV SOLN
25.0000 g | Freq: Once | INTRAVENOUS | Status: AC
  Administered 2024-01-27: 25 g via INTRAVENOUS

## 2024-01-27 MED ORDER — DIPHENHYDRAMINE HCL 25 MG PO CAPS
25.0000 mg | ORAL_CAPSULE | Freq: Once | ORAL | Status: AC
  Administered 2024-01-27: 25 mg via ORAL

## 2024-01-27 MED ORDER — ACETAMINOPHEN 325 MG PO TABS
ORAL_TABLET | ORAL | Status: AC
  Filled 2024-01-27: qty 2

## 2024-01-27 MED FILL — Midodrine HCl Tab 5 MG: ORAL | Qty: 2 | Status: AC

## 2024-01-27 NOTE — Progress Notes (Signed)
 Rapid response nurse paged due continuing rectal bleeding  Bp 95/57

## 2024-01-27 NOTE — Progress Notes (Addendum)
 PROGRESS NOTE        PATIENT DETAILS Name: [name] Age: 53 y.o. Sex: male Date of Birth: [birth year] Admit Date: 01/24/2024 Admitting Physician Marsha Ada, MD ERE:[id number], Elsie Sayre, MD  Brief Summary: Patient is a 53 y.o.  male with history of ESRD, ascites without formal diagnosis of liver cirrhosis, SBP (Klebsiella on peritoneal culture on 8/19), chronic HFrEF-presented with lower GI bleeding with acute blood loss anemia.  Significant events: 9/29>> admit to [facility]  Significant studies: 9/29>> CT abdomen/pelvis: Large loculated peritoneal fluid collection left abdomen/midline lower abdomen-tracks into right inguinal canal. 9/30>> CT angio GI bleed study: No extravasation.  Severe diverticulosis.  Significant microbiology data: None  Procedures: None  Consults: Nephrology GI  Subjective: No further hematochezia-last episode was on 9/30.  Unable to finish colonoscopy prep overnight.  Objective: Vitals: Blood pressure 118/62, pulse (!) 104, temperature 98.6 F (37 C), temperature source Oral, resp. rate 18, height 5' 11 (1.803 m), weight 68 kg, SpO2 96%.   Exam: Gen Exam:Alert awake-not in any distress HEENT:atraumatic, normocephalic Chest: B/L clear to auscultation anteriorly CVS:S1S2 regular Abdomen:soft non tender, non distended Extremities:++ edema Neurology: Non focal Skin: no rash  Pertinent Labs/Radiology:    Latest Ref Rng & Units 01/27/2024    2:52 AM 01/26/2024    4:35 PM 01/26/2024    3:41 AM  CBC  WBC 4.0 - 10.5 K/uL 11.7  11.8  5.8   Hemoglobin 13.0 - 17.0 g/dL 9.4  89.2  9.7   Hematocrit 39.0 - 52.0 % 28.0  31.4  28.5   Platelets 150 - 400 K/uL 100  110  122     Lab Results  Component Value Date   NA 131 (L) 01/27/2024   K 4.3 01/27/2024   CL 92 (L) 01/27/2024   CO2 22 01/27/2024      Assessment/Plan: Lower GI bleeding with acute blood loss anemia Suspected diverticular bleed-but could be  hemorrhoidal as well No other hematochezia-appears last episode was on 9/30. Has required a total of 4 units of PRBC-last transfused 9/30 Hb stable this morning GI following-at family's request-inpatient colonoscopy/EGD plan-unfortunately-patient unable to complete colonoscopy prep overnight-RN reaching out to GI service-likely will need 2-day prep-possible colonoscopy on 10/3.  Addendum: Unfortunately had more hematochezia this afternoon-repeat Hb down to 8.5, d/w GI will go ahead and transfuse one additional unit of PRBC, Colonoscopy tentatively scheduled for tomorrow. Will order post transfusion CBC as well.Updated sister over the phone this evening.  ESRD on HD TTS Nephrology following.  SLE Stable-continue prednisone /Plaquenil .  Chronic ascites-without formal diagnosis of cirrhosis complicated by SBP (Klebsiella 11/2023) CT reveals now a loculated effusion GI has offered workup for cirrhosis in the past-per prior documentation-both patient and family has refused. Currently appears stable-no abdominal pain-afebrile.  Chronic HFpEF Some mild lower extremity edema but otherwise stable Continue volume removal with HD  HTN BP soft but stable Coreg  on hold-but on midodrine for BP support.  Hypothyroidism Continue Synthroid   Debility/deconditioning Likely some debility at baseline-worse due to acute illness/GI bleeding PT/OT consulted.  Code status:   Code Status: Full Code   DVT Prophylaxis: SCDs Start: 01/24/24 0753   Family Communication:Sister-Michell [phone]-updated 10/2  Disposition Plan: Status is: Inpatient Remains inpatient appropriate because: Severity of illness   Planned Discharge Destination:Home   Diet: Diet Order  Diet clear liquid Room service appropriate? Yes; Fluid consistency: Thin  Diet effective now                     Antimicrobial agents: Anti-infectives (From admission, onward)    Start     Dose/Rate Route  Frequency Ordered Stop   01/24/24 1000  hydroxychloroquine  (PLAQUENIL ) tablet 200 mg        200 mg Oral Daily 01/24/24 0833          MEDICATIONS: Scheduled Meds:  sodium chloride    Intravenous Once   calcium  acetate  667 mg Oral TID AC   Chlorhexidine  Gluconate Cloth  6 each Topical Q0600   darbepoetin (ARANESP ) injection - DIALYSIS  200 mcg Subcutaneous Q Tue-1800   feeding supplement  1 Container Oral TID BM   folic acid   1 mg Oral Daily   hydroxychloroquine   200 mg Oral Daily   levothyroxine   25 mcg Oral Q0600   midodrine  10 mg Oral TID WC   multivitamin  1 tablet Oral QHS   Na Sulfate-K Sulfate-Mg Sulfate concentrate  0.5 kit Oral Once   pantoprazole  (PROTONIX ) IV  40 mg Intravenous Q12H   predniSONE   20 mg Oral Daily   simethicone   240 mg Oral Once   Continuous Infusions:  sodium chloride  Stopped (01/27/24 0925)    PRN Meds:.traMADol    I have personally reviewed following labs and imaging studies  LABORATORY DATA: CBC: Recent Labs  Lab 01/24/24 0535 01/24/24 2036 01/25/24 1329 01/25/24 1830 01/26/24 0341 01/26/24 1635 01/27/24 0252  WBC 5.6   < > 4.3 3.6* 5.8 11.8* 11.7*  NEUTROABS 3.7  --   --   --   --   --   --   HGB 5.3*   < > 7.2* 6.5* 9.7* 10.7* 9.4*  HCT 16.6*   < > 21.1* 19.3* 28.5* 31.4* 28.0*  MCV 98.8   < > 88.7 90.2 88.2 88.7 88.3  PLT 154   < > 111* 92* 122* 110* 100*   < > = values in this interval not displayed.    Basic Metabolic Panel: Recent Labs  Lab 01/24/24 0535 01/25/24 0257 01/26/24 0341 01/27/24 0252  NA 127* 127* 127* 131*  K 5.7* 5.8* 4.4 4.3  CL 96* 95* 96* 92*  CO2 22 22 23 22   GLUCOSE 128* 91 89 108*  BUN 97* 100* 52* 63*  CREATININE 6.19* 6.17* 4.07* 4.98*  CALCIUM  7.4* 7.2* 7.2* 7.4*  PHOS  --  4.6 4.2 4.5    GFR: Estimated Creatinine Clearance: 16.3 mL/min (A) (by C-G formula based on SCr of 4.98 mg/dL (H)).  Liver Function Tests: Recent Labs  Lab 01/25/24 0257 01/26/24 0341 01/27/24 0252  ALBUMIN   <1.5* <1.5* <1.5*   No results for input(s): LIPASE, AMYLASE in the last 168 hours. No results for input(s): AMMONIA in the last 168 hours.  Coagulation Profile: Recent Labs  Lab 01/24/24 2036  INR 1.0    Cardiac Enzymes: No results for input(s): CKTOTAL, CKMB, CKMBINDEX, TROPONINI in the last 168 hours.  BNP (last 3 results) No results for input(s): PROBNP in the last 8760 hours.  Lipid Profile: No results for input(s): CHOL, HDL, LDLCALC, TRIG, CHOLHDL, LDLDIRECT in the last 72 hours.  Thyroid  Function Tests: No results for input(s): TSH, T4TOTAL, FREET4, T3FREE, THYROIDAB in the last 72 hours.  Anemia Panel: No results for input(s): VITAMINB12, FOLATE, FERRITIN, TIBC, IRON, RETICCTPCT in the last 72 hours.  Urine analysis:  Component Value Date/Time   COLORURINE AMBER (A) 06/29/2021 0425   APPEARANCEUR HAZY (A) 06/29/2021 0425   LABSPEC 1.018 06/29/2021 0425   PHURINE 7.0 06/29/2021 0425   GLUCOSEU NEGATIVE 06/29/2021 0425   HGBUR SMALL (A) 06/29/2021 0425   BILIRUBINUR NEGATIVE 06/29/2021 0425   KETONESUR 5 (A) 06/29/2021 0425   PROTEINUR >=300 (A) 06/29/2021 0425   NITRITE NEGATIVE 06/29/2021 0425   LEUKOCYTESUR NEGATIVE 06/29/2021 0425    Sepsis Labs: Lactic Acid, Venous    Component Value Date/Time   LATICACIDVEN 2.0 (HH) 12/14/2023 1623    MICROBIOLOGY: No results found for this or any previous visit (from the past 240 hours).  RADIOLOGY STUDIES/RESULTS: CT ANGIO GI BLEED Result Date: 01/25/2024 EXAM: CTA ABDOMEN AND PELVIS WITH CONTRAST 01/25/2024 07:35:00 PM TECHNIQUE: CTA images of the abdomen and pelvis with and without intravenous contrast. Three-dimensional MIP/volume rendered formations were performed. Automated exposure control, iterative reconstruction, and/or weight based adjustment of the mA/kV was utilized to reduce the radiation dose to as low as reasonably achievable. COMPARISON: CT  abdomen and pelvis 02/29/2025. CLINICAL HISTORY: Aneurysm, renal or visceral; lower GI bleed-please assess for active extravasation. CT ANGIO GI BLEED; Aneurysm, renal or visceral, lower GI bleed-please assess for active extravasation. Omni 350 70mL. FINDINGS: VASCULATURE: GI BLEED: No evidence of active GI bleeding. AORTA: No acute finding. No abdominal aortic aneurysm. No dissection. CELIAC TRUNK: No acute finding. No occlusion or significant stenosis. SUPERIOR MESENTERIC ARTERY: No acute finding. No occlusion or significant stenosis. INFERIOR MESENTERIC ARTERY: No acute finding. No occlusion or significant stenosis. RENAL ARTERIES: No acute finding. No occlusion or significant stenosis. ILIAC ARTERIES: No acute finding. No occlusion or significant stenosis. ABDOMEN/PELVIS: LOWER CHEST: Visualized portion of the lower chest demonstrates no acute abnormality. LIVER: Cirrhotic liver morphology. Unchanged small subcapsular fluid collection along the posterior right hepatic lobe; there is again a tenuous connection with the large peritoneal fluid collection described below. GALLBLADDER AND BILE DUCTS: Gallbladder is unremarkable. No biliary ductal dilatation. SPLEEN: The spleen is unremarkable. PANCREAS: The pancreas is unremarkable. ADRENAL GLANDS: Bilateral adrenal glands demonstrate no acute abnormality. KIDNEYS, URETERS AND BLADDER: Bilateral cortical renal atrophy. Chronic nonspecific perinephric stranding. Bladder wall thickening. No stones in the kidneys or ureters. No hydronephrosis. GI AND BOWEL: Mild diffuse gastric wall thickening. Large colonic stool burden. Additional wall thickening of the proximal jejunum near the ligament of Treitz. Wall thickening of the rectum with fat stranding and edema. Extensive colonic diverticulosis. There is no bowel obstruction. No evidence of active bleeding. REPRODUCTIVE: Scrotal edema. PERITONEUM AND RETROPERITONEUM: Thick-walled organized collection extending from the  inferior surface of the liver into the low anterior abdomen and right inguinal canal/scrotum is unchanged. Similar mass effect on the adjacent small bowel and mesentery. Mesenteric and omental edema. Similar free fluid in the abdomen and pelvis. No free intraperitoneal air. LYMPH NODES: Similar shotty retroperitoneal lymph nodes. BONES AND SOFT TISSUES: Avascular necrosis of both femoral heads. Body wall anasarca and scrotal edema. No acute abnormality of the bones. No acute soft tissue abnormality. IMPRESSION: 1. No active gastrointestinal bleeding. 2. Wall thickening of the stomach, jejunum near the ligament of treitz, and rectum are favored infectious/inflammatory though portal congestive Gastropathy/enteropathy/colopathy could appear similarly. 3. Extensive colonic diverticulosis with large colonic stool burden, without evidence of diverticulitis. 4. Bladder wall thickening and nonspecific perinephric stranding. Correlate for cystitis/pyelonephritis. Electronically signed by: Norman Gatlin MD 01/25/2024 08:11 PM EDT RP Workstation: [id number]     LOS: 3 days   Donalda Applebaum, MD  [facility]    To contact the attending provider between 7A-7P or the covering provider during after hours 7P-7A, please log into the web site [email or web address] and access using universal [place] password for that web site. If you do not have the password, please call the hospital operator.  01/27/2024, 11:17 AM

## 2024-01-27 NOTE — Progress Notes (Addendum)
 Pt at this time feeling very nauseated and vomiting. Pt has been attempting to complete the first portion of bowel prep for scheduled colonoscopy in the AM, yet states he doesn't believe he will be able to complete it with the way he is feeling; nauseous and overall lousiness. Patients vital signs are WNL with no signs of active bleeding present. RN contacted A.Kakrakandy, who then ordered PRN nausea medication in hopes that patient can complete bowl prep. RN will give med and follow up.   0030: After medication, patient feeling less nauseated yet after taking a couple more sips of bowel prep, states he can't do it anymore. Patient apologizes yet states because of his fatigue, weakness, and abdominal pain he refuses to drink anymore for the remainder of the night and that he will attempt again tomorrow. RN educated patient, once again, on the importance of the bowel prep for scheduled procedure yet patient apologetically refuses. Patients vitals continue to be WNL and no active bleeding present. RN will continue to monitor.

## 2024-01-27 NOTE — Plan of Care (Signed)
  Problem: Education: Goal: Knowledge of General Education information will improve Description: Including pain rating scale, medication(s)/side effects and non-pharmacologic comfort measures Outcome: Progressing   Problem: Health Behavior/Discharge Planning: Goal: Ability to manage health-related needs will improve Outcome: Progressing   Problem: Clinical Measurements: Goal: Ability to maintain clinical measurements within normal limits will improve Outcome: Progressing Goal: Will remain free from infection Outcome: Progressing Goal: Diagnostic test results will improve Outcome: Progressing   Problem: Nutrition: Goal: Adequate nutrition will be maintained Outcome: Progressing   

## 2024-01-27 NOTE — Progress Notes (Addendum)
 Daily Progress Note  DOA: 01/24/2024 Hospital Day: 4  Cc: GI bleed, anemia   ASSESSMENT    53 yo male admitted with GI bleeding ( BRBPR) in setting of constipation / straining. Differential diagnosis :  hemorrhoids, diverticular bleeding, AVMs. No mass lesions on CT scan.  No active bleeding on CT angio.  Has received 5 u RBC ( last one on 10/1 around 2 am) TODAY:  Hgb 9.4,  which I think is stable after equilibration from blood transfusions . He was only able tolerate a cup of bowel prep. Family didn't want NGT placed for prep. No family in room right now. Patient wants to continue to prep bowels today and proceed with procedures tomorrow. There is some stool in flexiseal   ESRD on HD TTS Has dialysis today    Chronic ascites thought to be 2/2 to hypoalbuminemia though imaging studies have suggested cirrhosis. History of SBP ( Klebsiella on culture in August) Non -contrast CT scan this showing a small subcapsular fluid collection along the posterior right hepatic lobe, stable to slightly smaller. That collection appears to have a tenuous communication with a larger peritoneal fluid collection occupying the left abdomen and midline lower abdomen and tracking into the right inguinal canal, with inguinal/scrotal fluid. CT angio demonstrating cirrhotic morphology of liver.    HFrEF EF 55-60 % on echo Aug 2025   Hypothyroidism   Principal Problem:   Bright red blood per rectum Active Problems:   History of ascites   ABLA (acute blood loss anemia)   Constipation   Hypotension due to hypovolemia   Anemia associated with acute blood loss   End-stage renal disease on hemodialysis (HCC)   PLAN   -Continue with attempts to prep bowel. Will reschedule for EGD and colonoscopy tomorrow. Stay on clear liquids today, can keep flexiseal in    Subjective   Only able to drink 1 cup of bowel prep. Complains of abdominal pain and asking to be dialyzed.    Objective    Recent  Labs    01/26/24 0341 01/26/24 1635 01/27/24 0252  WBC 5.8 11.8* 11.7*  HGB 9.7* 10.7* 9.4*  HCT 28.5* 31.4* 28.0*  MCV 88.2 88.7 88.3  PLT 122* 110* 100*   No results for input(s): FOLATE, VITAMINB12, FERRITIN, TIBC, IRONPCTSAT in the last 72 hours. Recent Labs    01/25/24 0257 01/26/24 0341 01/27/24 0252  NA 127* 127* 131*  K 5.8* 4.4 4.3  CL 95* 96* 92*  CO2 22 23 22   GLUCOSE 91 89 108*  BUN 100* 52* 63*  CREATININE 6.17* 4.07* 4.98*  CALCIUM  7.2* 7.2* 7.4*   Recent Labs    01/25/24 0257 01/26/24 0341 01/27/24 0252  ALBUMIN  <1.5* <1.5* <1.5*   Imaging:  CT ANGIO GI BLEED EXAM: CTA ABDOMEN AND PELVIS WITH CONTRAST 01/25/2024 07:35:00 PM  TECHNIQUE: CTA images of the abdomen and pelvis with and without intravenous contrast. Three-dimensional MIP/volume rendered formations were performed. Automated exposure control, iterative reconstruction, and/or weight based adjustment of the mA/kV was utilized to reduce the radiation dose to as low as reasonably achievable.  COMPARISON: CT abdomen and pelvis 02/29/2025.  CLINICAL HISTORY: Aneurysm, renal or visceral; lower GI bleed-please assess for active extravasation. CT ANGIO GI BLEED; Aneurysm, renal or visceral, lower GI bleed-please assess for active extravasation. Omni 350 70mL.  FINDINGS:  VASCULATURE: GI BLEED: No evidence of active GI bleeding.  AORTA: No acute finding. No abdominal aortic aneurysm. No dissection.  CELIAC TRUNK: No acute  finding. No occlusion or significant stenosis.  SUPERIOR MESENTERIC ARTERY: No acute finding. No occlusion or significant stenosis.  INFERIOR MESENTERIC ARTERY: No acute finding. No occlusion or significant stenosis.  RENAL ARTERIES: No acute finding. No occlusion or significant stenosis.  ILIAC ARTERIES: No acute finding. No occlusion or significant stenosis.  ABDOMEN/PELVIS: LOWER CHEST: Visualized portion of the lower chest demonstrates no  acute abnormality.  LIVER: Cirrhotic liver morphology. Unchanged small subcapsular fluid collection along the posterior right hepatic lobe; there is again a tenuous connection with the large peritoneal fluid collection described below.  GALLBLADDER AND BILE DUCTS: Gallbladder is unremarkable. No biliary ductal dilatation.  SPLEEN: The spleen is unremarkable.  PANCREAS: The pancreas is unremarkable.  ADRENAL GLANDS: Bilateral adrenal glands demonstrate no acute abnormality.  KIDNEYS, URETERS AND BLADDER: Bilateral cortical renal atrophy. Chronic nonspecific perinephric stranding. Bladder wall thickening. No stones in the kidneys or ureters. No hydronephrosis.  GI AND BOWEL: Mild diffuse gastric wall thickening. Large colonic stool burden. Additional wall thickening of the proximal jejunum near the ligament of Treitz. Wall thickening of the rectum with fat stranding and edema. Extensive colonic diverticulosis. There is no bowel obstruction. No evidence of active bleeding.  REPRODUCTIVE: Scrotal edema.  PERITONEUM AND RETROPERITONEUM: Thick-walled organized collection extending from the inferior surface of the liver into the low anterior abdomen and right inguinal canal/scrotum is unchanged. Similar mass effect on the adjacent small bowel and mesentery. Mesenteric and omental edema. Similar free fluid in the abdomen and pelvis. No free intraperitoneal air.  LYMPH NODES: Similar shotty retroperitoneal lymph nodes.  BONES AND SOFT TISSUES: Avascular necrosis of both femoral heads. Body wall anasarca and scrotal edema. No acute abnormality of the bones. No acute soft tissue abnormality.  IMPRESSION: 1. No active gastrointestinal bleeding. 2. Wall thickening of the stomach, jejunum near the ligament of treitz, and rectum are favored infectious/inflammatory though portal congestive Gastropathy/enteropathy/colopathy could appear similarly. 3. Extensive colonic  diverticulosis with large colonic stool burden, without evidence of diverticulitis. 4. Bladder wall thickening and nonspecific perinephric stranding. Correlate for cystitis/pyelonephritis.  Electronically signed by: Norman Gatlin MD 01/25/2024 08:11 PM EDT RP Workstation: [id number]     Scheduled inpatient medications:   sodium chloride    Intravenous Once   calcium  acetate  667 mg Oral TID AC   Chlorhexidine  Gluconate Cloth  6 each Topical Q0600   darbepoetin (ARANESP ) injection - DIALYSIS  200 mcg Subcutaneous Q Tue-1800   feeding supplement  1 Container Oral TID BM   folic acid   1 mg Oral Daily   hydroxychloroquine   200 mg Oral Daily   levothyroxine   25 mcg Oral Q0600   midodrine  10 mg Oral TID WC   multivitamin  1 tablet Oral QHS   Na Sulfate-K Sulfate-Mg Sulfate concentrate  0.5 kit Oral Once   pantoprazole  (PROTONIX ) IV  40 mg Intravenous Q12H   predniSONE   20 mg Oral Daily   simethicone   240 mg Oral Once   Continuous inpatient infusions:   sodium chloride  Stopped (01/27/24 0925)   PRN inpatient medications: traMADol   Vital signs in last 24 hours: Temp:  [97.3 F (36.3 C)-98.6 F (37 C)] 98.6 F (37 C) (10/02 1100) Pulse Rate:  [79-104] 104 (10/02 0400) Resp:  [9-18] 18 (10/02 0400) BP: (106-118)/(54-62) 118/62 (10/02 1100) SpO2:  [96 %-98 %] 96 % (10/02 0400) Last BM Date : 01/24/24  Intake/Output Summary (Last 24 hours) at 01/27/2024 1219 Last data filed at 01/27/2024 0625 Gross per 24 hour  Intake 20 ml  Output 0 ml  Net 20 ml    Intake/Output from previous day: 10/01 0701 - 10/02 0700 In: 60 [P.O.:60] Out: 0  Intake/Output this shift: No intake/output data recorded.   Physical Exam:  General: Alert male in NAD Heart:  Regular rate and rhythm.  Pulmonary: Normal respiratory effort Abdomen: Soft, moderately distended, tympanitic, generalized tenderness.  Normal bowel sounds. Extremities: No lower extremity edema  Neurologic: Alert and  oriented Psych: Pleasant. Cooperative     LOS: 3 days   [name] ,NP 01/27/2024, 12:19 PM    Attending physician's note   I have reviewed the chart and discussed his care on rounds. I performed a substantive portion of this encounter, including complete performance of at least one of the key components, in conjunction with the APP. I agree with the APP's note, impression, and recommendations with my edits.   Did not tolerate bowel prep last evening, but he would like to try again today/tonight.  Did have some BRB with prep.  H/H 9.4/28 this morning, but afternoon labs with downtrend to 8.5/25.2. - Transfuse 1 unit now - Continue with clears with repeat prep tonight - N.p.o. at midnight aside from prep and medications - Plan for EGD/colonoscopy tomorrow for diagnostic and therapeutic intent - Will update patient's sister, Rosaline, after procedure  A total of 35 minutes of time was spent on this encounter, including in depth chart review, independent review of results as outlined above, communicating results with the patient directly, face-to-face time with the patient, coordinating care, and ordering studies and medications as appropriate, and documentation.   [address], DO, FACG [phone] office

## 2024-01-27 NOTE — H&P (View-Only) (Signed)
 Daily Progress Note  DOA: 01/24/2024 Hospital Day: 4  Cc: GI bleed, anemia   ASSESSMENT    53 yo male admitted with GI bleeding ( BRBPR) in setting of constipation / straining. Differential diagnosis :  hemorrhoids, diverticular bleeding, AVMs. No mass lesions on CT scan.  No active bleeding on CT angio.  Has received 5 u RBC ( last one on 10/1 around 2 am) TODAY:  Hgb 9.4,  which I think is stable after equilibration from blood transfusions . He was only able tolerate a cup of bowel prep. Family didn't want NGT placed for prep. No family in room right now. Patient wants to continue to prep bowels today and proceed with procedures tomorrow. There is some stool in flexiseal   ESRD on HD TTS Has dialysis today    Chronic ascites thought to be 2/2 to hypoalbuminemia though imaging studies have suggested cirrhosis. History of SBP ( Klebsiella on culture in August) Non -contrast CT scan this showing a small subcapsular fluid collection along the posterior right hepatic lobe, stable to slightly smaller. That collection appears to have a tenuous communication with a larger peritoneal fluid collection occupying the left abdomen and midline lower abdomen and tracking into the right inguinal canal, with inguinal/scrotal fluid. CT angio demonstrating cirrhotic morphology of liver.    HFrEF EF 55-60 % on echo Aug 2025   Hypothyroidism   Principal Problem:   Bright red blood per rectum Active Problems:   History of ascites   ABLA (acute blood loss anemia)   Constipation   Hypotension due to hypovolemia   Anemia associated with acute blood loss   End-stage renal disease on hemodialysis (HCC)   PLAN   -Continue with attempts to prep bowel. Will reschedule for EGD and colonoscopy tomorrow. Stay on clear liquids today, can keep flexiseal in    Subjective   Only able to drink 1 cup of bowel prep. Complains of abdominal pain and asking to be dialyzed.    Objective    Recent  Labs    01/26/24 0341 01/26/24 1635 01/27/24 0252  WBC 5.8 11.8* 11.7*  HGB 9.7* 10.7* 9.4*  HCT 28.5* 31.4* 28.0*  MCV 88.2 88.7 88.3  PLT 122* 110* 100*   No results for input(s): FOLATE, VITAMINB12, FERRITIN, TIBC, IRONPCTSAT in the last 72 hours. Recent Labs    01/25/24 0257 01/26/24 0341 01/27/24 0252  NA 127* 127* 131*  K 5.8* 4.4 4.3  CL 95* 96* 92*  CO2 22 23 22   GLUCOSE 91 89 108*  BUN 100* 52* 63*  CREATININE 6.17* 4.07* 4.98*  CALCIUM  7.2* 7.2* 7.4*   Recent Labs    01/25/24 0257 01/26/24 0341 01/27/24 0252  ALBUMIN  <1.5* <1.5* <1.5*   Imaging:  CT ANGIO GI BLEED EXAM: CTA ABDOMEN AND PELVIS WITH CONTRAST 01/25/2024 07:35:00 PM  TECHNIQUE: CTA images of the abdomen and pelvis with and without intravenous contrast. Three-dimensional MIP/volume rendered formations were performed. Automated exposure control, iterative reconstruction, and/or weight based adjustment of the mA/kV was utilized to reduce the radiation dose to as low as reasonably achievable.  COMPARISON: CT abdomen and pelvis 02/29/2025.  CLINICAL HISTORY: Aneurysm, renal or visceral; lower GI bleed-please assess for active extravasation. CT ANGIO GI BLEED; Aneurysm, renal or visceral, lower GI bleed-please assess for active extravasation. Omni 350 70mL.  FINDINGS:  VASCULATURE: GI BLEED: No evidence of active GI bleeding.  AORTA: No acute finding. No abdominal aortic aneurysm. No dissection.  CELIAC TRUNK: No acute  finding. No occlusion or significant stenosis.  SUPERIOR MESENTERIC ARTERY: No acute finding. No occlusion or significant stenosis.  INFERIOR MESENTERIC ARTERY: No acute finding. No occlusion or significant stenosis.  RENAL ARTERIES: No acute finding. No occlusion or significant stenosis.  ILIAC ARTERIES: No acute finding. No occlusion or significant stenosis.  ABDOMEN/PELVIS: LOWER CHEST: Visualized portion of the lower chest demonstrates no  acute abnormality.  LIVER: Cirrhotic liver morphology. Unchanged small subcapsular fluid collection along the posterior right hepatic lobe; there is again a tenuous connection with the large peritoneal fluid collection described below.  GALLBLADDER AND BILE DUCTS: Gallbladder is unremarkable. No biliary ductal dilatation.  SPLEEN: The spleen is unremarkable.  PANCREAS: The pancreas is unremarkable.  ADRENAL GLANDS: Bilateral adrenal glands demonstrate no acute abnormality.  KIDNEYS, URETERS AND BLADDER: Bilateral cortical renal atrophy. Chronic nonspecific perinephric stranding. Bladder wall thickening. No stones in the kidneys or ureters. No hydronephrosis.  GI AND BOWEL: Mild diffuse gastric wall thickening. Large colonic stool burden. Additional wall thickening of the proximal jejunum near the ligament of Treitz. Wall thickening of the rectum with fat stranding and edema. Extensive colonic diverticulosis. There is no bowel obstruction. No evidence of active bleeding.  REPRODUCTIVE: Scrotal edema.  PERITONEUM AND RETROPERITONEUM: Thick-walled organized collection extending from the inferior surface of the liver into the low anterior abdomen and right inguinal canal/scrotum is unchanged. Similar mass effect on the adjacent small bowel and mesentery. Mesenteric and omental edema. Similar free fluid in the abdomen and pelvis. No free intraperitoneal air.  LYMPH NODES: Similar shotty retroperitoneal lymph nodes.  BONES AND SOFT TISSUES: Avascular necrosis of both femoral heads. Body wall anasarca and scrotal edema. No acute abnormality of the bones. No acute soft tissue abnormality.  IMPRESSION: 1. No active gastrointestinal bleeding. 2. Wall thickening of the stomach, jejunum near the ligament of treitz, and rectum are favored infectious/inflammatory though portal congestive Gastropathy/enteropathy/colopathy could appear similarly. 3. Extensive colonic  diverticulosis with large colonic stool burden, without evidence of diverticulitis. 4. Bladder wall thickening and nonspecific perinephric stranding. Correlate for cystitis/pyelonephritis.  Electronically signed by: Norman Gatlin MD 01/25/2024 08:11 PM EDT RP Workstation: [id number]     Scheduled inpatient medications:   sodium chloride    Intravenous Once   calcium  acetate  667 mg Oral TID AC   Chlorhexidine  Gluconate Cloth  6 each Topical Q0600   darbepoetin (ARANESP ) injection - DIALYSIS  200 mcg Subcutaneous Q Tue-1800   feeding supplement  1 Container Oral TID BM   folic acid   1 mg Oral Daily   hydroxychloroquine   200 mg Oral Daily   levothyroxine   25 mcg Oral Q0600   midodrine  10 mg Oral TID WC   multivitamin  1 tablet Oral QHS   Na Sulfate-K Sulfate-Mg Sulfate concentrate  0.5 kit Oral Once   pantoprazole  (PROTONIX ) IV  40 mg Intravenous Q12H   predniSONE   20 mg Oral Daily   simethicone   240 mg Oral Once   Continuous inpatient infusions:   sodium chloride  Stopped (01/27/24 0925)   PRN inpatient medications: traMADol   Vital signs in last 24 hours: Temp:  [97.3 F (36.3 C)-98.6 F (37 C)] 98.6 F (37 C) (10/02 1100) Pulse Rate:  [79-104] 104 (10/02 0400) Resp:  [9-18] 18 (10/02 0400) BP: (106-118)/(54-62) 118/62 (10/02 1100) SpO2:  [96 %-98 %] 96 % (10/02 0400) Last BM Date : 01/24/24  Intake/Output Summary (Last 24 hours) at 01/27/2024 1219 Last data filed at 01/27/2024 0625 Gross per 24 hour  Intake 20 ml  Output 0 ml  Net 20 ml    Intake/Output from previous day: 10/01 0701 - 10/02 0700 In: 60 [P.O.:60] Out: 0  Intake/Output this shift: No intake/output data recorded.   Physical Exam:  General: Alert male in NAD Heart:  Regular rate and rhythm.  Pulmonary: Normal respiratory effort Abdomen: Soft, moderately distended, tympanitic, generalized tenderness.  Normal bowel sounds. Extremities: No lower extremity edema  Neurologic: Alert and  oriented Psych: Pleasant. Cooperative     LOS: 3 days   Vina Dasen ,NP 01/27/2024, 12:19 PM    Attending physician's note   I have reviewed the chart and discussed his care on rounds. I performed a substantive portion of this encounter, including complete performance of at least one of the key components, in conjunction with the APP. I agree with the APP's note, impression, and recommendations with my edits.   Did not tolerate bowel prep last evening, but he would like to try again today/tonight.  Did have some BRB with prep.  H/H 9.4/28 this morning, but afternoon labs with downtrend to 8.5/25.2. - Transfuse 1 unit now - Continue with clears with repeat prep tonight - N.p.o. at midnight aside from prep and medications - Plan for EGD/colonoscopy tomorrow for diagnostic and therapeutic intent - Will update patient's sister, Rosaline, after procedure  A total of 35 minutes of time was spent on this encounter, including in depth chart review, independent review of results as outlined above, communicating results with the patient directly, face-to-face time with the patient, coordinating care, and ordering studies and medications as appropriate, and documentation.   [address], DO, FACG [phone] office

## 2024-01-27 NOTE — Progress Notes (Signed)
 Pt.'s sister called for an update while we were actively taking care of the patient for approximately 1 hour and then sent the patient to dialysis. The patient was actively bleeding with bowel movements and needed three bed changes. The Press photographer, Production designer, theatre/television/film, Another Nurse, Student, Technician and I were all in the room. Physician was aware. Pt. Was sent to dialysis to receive dialysis, labs, and blood if needed.  In the meantime, I received two new admits and a transfer. Pt. Sister called back at approximately 5:15 and I was with one of the new patients and asked if it would be okay for me to call her back since I was with another patient. She asked to speak to the charge nurse. The charge nurse asked me to call her back. I did immediately due to her request. I updated her on everything mentioned above including but not limited to the canceled colonoscopy this morning due to the patient not being able to finish the contrast and it has been rescheduled for tomorrow. She asked me what his hemoglobin was I reported the last one and the one prior with dropped about 1.0. Then I asked her if she had any more questions or would like to have an update on anything else. She stated, I do but not from you and hung up on me.

## 2024-01-27 NOTE — Progress Notes (Signed)
 Received patient in bed.Alert and oriented x 2. Consent for treatment verified as well as his consent for blood transfusion.  Access used : Left upper arm that worked well.  Duration of treatment: 3.5 hours.  Uf goal: Met 1.5 L   Medicines given:  Albumin  25 g.=120 cc.                              Tylenol  650 mg.                              Midodrine 10 mg.                              Benadryl   25 MG. Hemo comment: 1 unit prbc =350cc                            Moved her bowel but not blood ,unable to clean ,all by myself with other patient connected to HD machine.  Hand off to the patient nurse,back into his room with stable condition via transporter.

## 2024-01-27 NOTE — Plan of Care (Signed)

## 2024-01-27 NOTE — Progress Notes (Signed)
 [facility] Progress Note   Subjective:    GI planning colonoscopy/EGD  Seen in room. Seems to getting more confused.  Endorses abd pain/bloating. Denies further bleeding.  Dialysis later today   Objective Vitals:   01/26/24 1600 01/26/24 2200 01/27/24 0400 01/27/24 0742  BP: (!) 108/54  (!) 118/56 (!) 106/57  Pulse: 79  (!) 104   Resp: (!) 9  18   Temp: 98.6 F (37 C) (!) 97.3 F (36.3 C) 98.6 F (37 C) 98.5 F (36.9 C)  TempSrc: Oral Oral Oral Oral  SpO2: 98%  96%   Weight:      Height:       Physical Exam General: Chronically ill appearing, frail, on room air  Heart: RRR Lungs: CTA anterolaterally Abdomen: Distended, non-tender.  Extremities: 1+ BLE edema Dialysis Access: L forearm AVF +t/b  Additional Objective Labs: Basic Metabolic Panel: Recent Labs  Lab 01/25/24 0257 01/26/24 0341 01/27/24 0252  NA 127* 127* 131*  K 5.8* 4.4 4.3  CL 95* 96* 92*  CO2 22 23 22   GLUCOSE 91 89 108*  BUN 100* 52* 63*  CREATININE 6.17* 4.07* 4.98*  CALCIUM  7.2* 7.2* 7.4*  PHOS 4.6 4.2 4.5   Liver Function Tests: Recent Labs  Lab 01/25/24 0257 01/26/24 0341 01/27/24 0252  ALBUMIN  <1.5* <1.5* <1.5*   CBC: Recent Labs  Lab 01/24/24 0535 01/24/24 2036 01/25/24 1329 01/25/24 1830 01/26/24 0341 01/26/24 1635 01/27/24 0252  WBC 5.6   < > 4.3 3.6* 5.8 11.8* 11.7*  NEUTROABS 3.7  --   --   --   --   --   --   HGB 5.3*   < > 7.2* 6.5* 9.7* 10.7* 9.4*  HCT 16.6*   < > 21.1* 19.3* 28.5* 31.4* 28.0*  MCV 98.8   < > 88.7 90.2 88.2 88.7 88.3  PLT 154   < > 111* 92* 122* 110* 100*   < > = values in this interval not displayed.   Blood Culture    Component Value Date/Time   SDES PERITONEAL 12/27/2023 1049   SDES PERITONEAL 12/27/2023 1049   SPECREQUEST NONE 12/27/2023 1049   SPECREQUEST NONE 12/27/2023 1049   CULT  12/27/2023 1049    NO GROWTH 3 DAYS Performed at [facility], [address], [place], [place] [address]    CULT  12/27/2023  1049    NO FUNGUS ISOLATED AFTER 21 DAYS Performed at [facility], [address] [place], [place] [address]    REPTSTATUS 12/30/2023 FINAL 12/27/2023 1049   REPTSTATUS 01/17/2024 FINAL 12/27/2023 1049   Studies/Results: CT ANGIO GI BLEED Result Date: 01/25/2024 EXAM: CTA ABDOMEN AND PELVIS WITH CONTRAST 01/25/2024 07:35:00 PM TECHNIQUE: CTA images of the abdomen and pelvis with and without intravenous contrast. Three-dimensional MIP/volume rendered formations were performed. Automated exposure control, iterative reconstruction, and/or weight based adjustment of the mA/kV was utilized to reduce the radiation dose to as low as reasonably achievable. COMPARISON: CT abdomen and pelvis 02/29/2025. CLINICAL HISTORY: Aneurysm, renal or visceral; lower GI bleed-please assess for active extravasation. CT ANGIO GI BLEED; Aneurysm, renal or visceral, lower GI bleed-please assess for active extravasation. Omni 350 70mL. FINDINGS: VASCULATURE: GI BLEED: No evidence of active GI bleeding. AORTA: No acute finding. No abdominal aortic aneurysm. No dissection. CELIAC TRUNK: No acute finding. No occlusion or significant stenosis. SUPERIOR MESENTERIC ARTERY: No acute finding. No occlusion or significant stenosis. INFERIOR MESENTERIC ARTERY: No acute finding. No occlusion or significant stenosis. RENAL  ARTERIES: No acute finding. No occlusion or significant stenosis. ILIAC ARTERIES: No acute finding. No occlusion or significant stenosis. ABDOMEN/PELVIS: LOWER CHEST: Visualized portion of the lower chest demonstrates no acute abnormality. LIVER: Cirrhotic liver morphology. Unchanged small subcapsular fluid collection along the posterior right hepatic lobe; there is again a tenuous connection with the large peritoneal fluid collection described below. GALLBLADDER AND BILE DUCTS: Gallbladder is unremarkable. No biliary ductal dilatation. SPLEEN: The spleen is unremarkable. PANCREAS: The pancreas is unremarkable. ADRENAL  GLANDS: Bilateral adrenal glands demonstrate no acute abnormality. KIDNEYS, URETERS AND BLADDER: Bilateral cortical renal atrophy. Chronic nonspecific perinephric stranding. Bladder wall thickening. No stones in the kidneys or ureters. No hydronephrosis. GI AND BOWEL: Mild diffuse gastric wall thickening. Large colonic stool burden. Additional wall thickening of the proximal jejunum near the ligament of Treitz. Wall thickening of the rectum with fat stranding and edema. Extensive colonic diverticulosis. There is no bowel obstruction. No evidence of active bleeding. REPRODUCTIVE: Scrotal edema. PERITONEUM AND RETROPERITONEUM: Thick-walled organized collection extending from the inferior surface of the liver into the low anterior abdomen and right inguinal canal/scrotum is unchanged. Similar mass effect on the adjacent small bowel and mesentery. Mesenteric and omental edema. Similar free fluid in the abdomen and pelvis. No free intraperitoneal air. LYMPH NODES: Similar shotty retroperitoneal lymph nodes. BONES AND SOFT TISSUES: Avascular necrosis of both femoral heads. Body wall anasarca and scrotal edema. No acute abnormality of the bones. No acute soft tissue abnormality. IMPRESSION: 1. No active gastrointestinal bleeding. 2. Wall thickening of the stomach, jejunum near the ligament of treitz, and rectum are favored infectious/inflammatory though portal congestive Gastropathy/enteropathy/colopathy could appear similarly. 3. Extensive colonic diverticulosis with large colonic stool burden, without evidence of diverticulitis. 4. Bladder wall thickening and nonspecific perinephric stranding. Correlate for cystitis/pyelonephritis. Electronically signed by: Norman Gatlin MD 01/25/2024 08:11 PM EDT RP Workstation: [id number]   Medications:  sodium chloride  Stopped (01/27/24 0925)     sodium chloride    Intravenous Once   calcium  acetate  667 mg Oral TID AC   Chlorhexidine  Gluconate Cloth  6 each Topical Q0600    darbepoetin (ARANESP ) injection - DIALYSIS  200 mcg Subcutaneous Q Tue-1800   feeding supplement  1 Container Oral TID BM   folic acid   1 mg Oral Daily   hydroxychloroquine   200 mg Oral Daily   levothyroxine   25 mcg Oral Q0600   midodrine  10 mg Oral TID WC   multivitamin  1 tablet Oral QHS   Na Sulfate-K Sulfate-Mg Sulfate concentrate  0.5 kit Oral Once   pantoprazole  (PROTONIX ) IV  40 mg Intravenous Q12H   predniSONE   20 mg Oral Daily   simethicone   240 mg Oral Once    Dialysis Orders TTS - AF 3:45hr, 400/A1.5, EDW 58kg, 2K/2Ca bath, AVF, no heparin  - Last HD 9/25, left at 65.7kg - up on fluids - Mircera 225mcg IV q 2 weeks - last given 9/16. Last Hgb 8.6 on 9/25 - Hectoral on hold   Assessment/Plan: BRBPR, ?hemorrhoidal bleeding: Hgb down on admit, s/p 2U PRBCs 9/29, 1U PRBC 9/30. GI following - appears now planning for  EGD/colonoscopy.  Hyperkalemia: Resolved  ESRD:  Usual TTS schedule. HD 10/2.  Hypotension/volume: BP soft. Does have some edema. UF as tolerated. On midodrine 10 mg TID  Anemia: Received Aranesp  200 on 9/30. Continue weekly  Metabolic bone disease: CorrCa ok/Phos ok for now. SLE: On Plaquenil  and steroids. Recurrent ascites c/b recent peritonitis.CT with loculated fluid going into R scrotum. Defer to primary  Maisie Ronnald Acosta PA-C [facility] 01/27/2024,10:49 AM

## 2024-01-28 DIAGNOSIS — K625 Hemorrhage of anus and rectum: Secondary | ICD-10-CM | POA: Diagnosis not present

## 2024-01-28 DIAGNOSIS — N186 End stage renal disease: Secondary | ICD-10-CM | POA: Diagnosis not present

## 2024-01-28 DIAGNOSIS — D62 Acute posthemorrhagic anemia: Secondary | ICD-10-CM | POA: Diagnosis not present

## 2024-01-28 DIAGNOSIS — Z87898 Personal history of other specified conditions: Secondary | ICD-10-CM | POA: Diagnosis not present

## 2024-01-28 LAB — CBC
HCT: 28.6 % — ABNORMAL LOW (ref 39.0–52.0)
Hemoglobin: 9.8 g/dL — ABNORMAL LOW (ref 13.0–17.0)
MCH: 29.6 pg (ref 26.0–34.0)
MCHC: 34.3 g/dL (ref 30.0–36.0)
MCV: 86.4 fL (ref 80.0–100.0)
Platelets: 75 K/uL — ABNORMAL LOW (ref 150–400)
RBC: 3.31 MIL/uL — ABNORMAL LOW (ref 4.22–5.81)
RDW: 17.5 % — ABNORMAL HIGH (ref 11.5–15.5)
WBC: 14.3 K/uL — ABNORMAL HIGH (ref 4.0–10.5)
nRBC: 0 % (ref 0.0–0.2)

## 2024-01-28 LAB — TYPE AND SCREEN
ABO/RH(D): O POS
Antibody Screen: NEGATIVE
Unit division: 0

## 2024-01-28 LAB — BPAM RBC
Blood Product Expiration Date: 202511022359
ISSUE DATE / TIME: 202510021737
Unit Type and Rh: 5100

## 2024-01-28 MED ORDER — POLYETHYLENE GLYCOL 3350 17 GM/SCOOP PO POWD
238.0000 g | Freq: Once | ORAL | Status: AC
  Administered 2024-01-28: 238 g via ORAL
  Filled 2024-01-28: qty 238

## 2024-01-28 MED ORDER — POLYETHYLENE GLYCOL 3350 17 GM/SCOOP PO POWD
119.0000 g | Freq: Once | ORAL | Status: AC
  Administered 2024-01-28: 119 g via ORAL
  Filled 2024-01-28: qty 119

## 2024-01-28 NOTE — Evaluation (Signed)
 Physical Therapy Evaluation Patient Details Name: SOLAN VOSLER MRN: [id number] DOB: 08-19-1970 Today's Date: 01/28/2024  History of Present Illness  53 y.o. male presents to [facility] 01/24/24 with GI bleed with acute blood loss anemia. Received 5 units RBC. Plan for colonoscopy. PMHx: ESRD on HDSLE, ascites without diagnosis of cirrhosis, chronic HFrecEF (EF 55-60% in 11/2023; previously 35-40% in 12/2021   Clinical Impression  PTA pt reported ambulating short distances, however, would not clarify if with an AD. Pt was limited by pain in abdomen in today's session and continuous loose stool. Pt was agreeable to roll to the right with MinA and use of bed rails but declined further movement/PT eval. Stated he would mobilize after scheduled colonoscopy tomorrow with education provided on the importance of continued mobility. At this time, recommending <3hrs post acute rehab to prevent future falls and work towards independence with mobility. If pt is able to mobilize with minimal assistance or less, then would recommend home with HHPT. Pt will have 24/7 care available upon returning home. Acute PT to follow to address functional impairments listed below.         If plan is discharge home, recommend the following: A lot of help with walking and/or transfers;A lot of help with bathing/dressing/bathroom;Assistance with cooking/housework;Assist for transportation;Help with stairs or ramp for entrance   Can travel by private vehicle   No    Equipment Recommendations None recommended by PT     Functional Status Assessment Patient has had a recent decline in their functional status and demonstrates the ability to make significant improvements in function in a reasonable and predictable amount of time.     Precautions / Restrictions Precautions Precautions: Fall Recall of Precautions/Restrictions: Impaired Restrictions Weight Bearing Restrictions Per Provider Order: No      Mobility  Bed  Mobility Overal bed mobility: Needs Assistance Bed Mobility: Rolling Rolling: Used rails, Min assist    General bed mobility comments: able to roll to the right with MinA and use of bed rail    Transfers    General transfer comment: Patient declined EOB       Balance Overall balance assessment: Needs assistance       Pertinent Vitals/Pain Pain Assessment Pain Assessment: Faces Faces Pain Scale: Hurts whole lot Pain Location: abdomen Pain Descriptors / Indicators: Discomfort, Grimacing, Guarding Pain Intervention(s): Limited activity within patient's tolerance, Monitored during session, Repositioned    Home Living Family/patient expects to be discharged to:: Private residence Living Arrangements: Alone Available Help at Discharge: Family;Available 24 hours/day Type of Home: House Home Access: Stairs to enter   [employer] of Steps: 3   Home Layout: Two level;Able to live on main level with bedroom/bathroom Home Equipment: Rolling Walker (2 wheels);Shower seat;Wheelchair - power Additional Comments: lives alone, states sister and aunt will have to help him    Prior Function Prior Level of Function : Needs assist    Mobility Comments: Was able to walk short household distances. ADLs Comments: Mod I with bathing and dressing, declining function due to increasing weakness.     Extremity/Trunk Assessment   Upper Extremity Assessment Upper Extremity Assessment: Defer to OT evaluation    Lower Extremity Assessment Lower Extremity Assessment: Generalized weakness (B LE edema)    Cervical / Trunk Assessment Cervical / Trunk Assessment: Normal  Communication   Communication Communication: No apparent difficulties    Cognition Arousal: Alert Behavior During Therapy: WFL for tasks assessed/performed   PT - Cognitive impairments: No family/caregiver present to determine baseline  Following commands: Intact Following commands impaired: Follows one  step commands with increased time     Cueing Cueing Techniques: Verbal cues      PT Assessment Patient needs continued PT services  PT Problem List Decreased strength;Decreased coordination;Decreased range of motion;Decreased cognition;Decreased knowledge of use of DME;Decreased activity tolerance;Decreased safety awareness;Decreased balance;Decreased mobility       PT Treatment Interventions DME instruction;Balance training;Gait training;Neuromuscular re-education;Stair training;Functional mobility training;Therapeutic activities;Therapeutic exercise;Patient/family education;Cognitive remediation;Manual techniques    PT Goals (Current goals can be found in the Care Plan section)  Acute Rehab PT Goals Patient Stated Goal: to have less pain when moving PT Goal Formulation: With patient Time For Goal Achievement: 02/11/24 Potential to Achieve Goals: Fair    Frequency Min 2X/week        AM-PAC PT 6 Clicks Mobility  Outcome Measure Help needed turning from your back to your side while in a flat bed without using bedrails?: A Lot Help needed moving from lying on your back to sitting on the side of a flat bed without using bedrails?: A Lot Help needed moving to and from a bed to a chair (including a wheelchair)?: Total Help needed standing up from a chair using your arms (e.g., wheelchair or bedside chair)?: Total Help needed to walk in hospital room?: Total Help needed climbing 3-5 steps with a railing? : Total 6 Click Score: 8    End of Session   Activity Tolerance: Patient limited by pain;Other (comment) (limited willingness to participate) Patient left: in bed;with call bell/phone within reach;with bed alarm set Nurse Communication: Mobility status PT Visit Diagnosis: Unsteadiness on feet (R26.81);Other abnormalities of gait and mobility (R26.89);Difficulty in walking, not elsewhere classified (R26.2);Muscle weakness (generalized) (M62.81)    Time: 8648-8592 PT Time  Calculation (min) (ACUTE ONLY): 16 min   Charges:   PT Evaluation $PT Eval Low Complexity: 1 Low   PT General Charges $$ ACUTE PT VISIT: 1 Visit       Kate ORN, PT, DPT Secure Chat Preferred  Rehab Office [phone]  Kate BRAVO Wendolyn 01/28/2024, 2:31 PM

## 2024-01-28 NOTE — TOC Progression Note (Signed)
 Transition of Care [facility]) - Progression Note    Patient Details  Name: [name] MRN: [id number] Date of Birth: Mar 29, 1971  Transition of Care [facility] CM/SW Contact  Corean JAYSON Canary, RN Phone Number: 01/28/2024, 3:12 PM  Clinical Narrative:     PT assessment reveals the recommendation for SNF. The patient had expressed that he did not want to go to SNF. Called patient cell phone, mailbox full , no answer at room phone. Will call back when time allows to converse with patient regarding discharge planning.   Expected Discharge Plan: Home/Self Care Barriers to Discharge: Continued Medical Work up               Expected Discharge Plan and Services   Discharge Planning Services: CM Consult   Living arrangements for the past 2 months: Single Family Home                 DME Arranged: N/A         HH Arranged: NA           Social Drivers of Health (SDOH) Interventions SDOH Screenings   Food Insecurity: No Food Insecurity (01/24/2024)  Housing: Unknown (01/24/2024)  Transportation Needs: No Transportation Needs (01/24/2024)  Utilities: Not At Risk (01/24/2024)  Depression (PHQ2-9): Low Risk  (01/02/2022)  Tobacco Use: Low Risk  (12/14/2023)    Readmission Risk Interventions    01/03/2024   11:13 AM  Readmission Risk Prevention Plan  Transportation Screening Complete  HRI or Home Care Consult Complete  Social Work Consult for Recovery Care Planning/Counseling Complete  Palliative Care Screening Not Applicable  Medication Review [employer]) Referral to Pharmacy

## 2024-01-28 NOTE — Progress Notes (Signed)
 PROGRESS NOTE        PATIENT DETAILS Name: [name] Age: 53 y.o. Sex: male Date of Birth: 11-05-1970 Admit Date: 01/24/2024 Admitting Physician Marsha Ada, MD ERE:[id number], Elsie Sayre, MD  Brief Summary: Patient is a 53 y.o.  male with history of ESRD, ascites without formal diagnosis of liver cirrhosis, SBP (Klebsiella on peritoneal culture on 8/19), chronic HFrEF-presented with lower GI bleeding with acute blood loss anemia.  Significant events: 9/29>> admit to [facility]  Significant studies: 9/29>> CT abdomen/pelvis: Large loculated peritoneal fluid collection left abdomen/midline lower abdomen-tracks into right inguinal canal. 9/30>> CT angio GI bleed study: No extravasation.  Severe diverticulosis.  Significant microbiology data: None  Procedures: None  Consults: Nephrology GI  Subjective: No further hematochezia overnight-had some hematochezia yesterday afternoon.  Required 1 more unit of PRBC.  Unfortunately still having brown stools  Objective: Vitals: Blood pressure (!) 107/50, pulse 88, temperature 97.9 F (36.6 C), temperature source Oral, resp. rate 15, height 5' 11 (1.803 m), weight 66.9 kg, SpO2 98%.   Exam: Gen Exam:Alert awake-not in any distress HEENT:atraumatic, normocephalic Chest: B/L clear to auscultation anteriorly CVS:S1S2 regular Abdomen:soft non tender, non distended Extremities:+ edema Neurology: Non focal Skin: no rash  Pertinent Labs/Radiology:    Latest Ref Rng & Units 01/28/2024    3:04 AM 01/27/2024    3:38 PM 01/27/2024    2:52 AM  CBC  WBC 4.0 - 10.5 K/uL 14.3  14.2  11.7   Hemoglobin 13.0 - 17.0 g/dL 9.8  8.5  9.4   Hematocrit 39.0 - 52.0 % 28.6  25.2  28.0   Platelets 150 - 400 K/uL 75  89  100     Lab Results  Component Value Date   NA 131 (L) 01/27/2024   K 4.3 01/27/2024   CL 92 (L) 01/27/2024   CO2 22 01/27/2024      Assessment/Plan: Lower GI bleeding with acute blood loss  anemia Suspected diverticular bleed-but could be hemorrhoidal as well Last episode of hematochezia on 10/2 afternoon Has required a total of 5 units of PRBC-last transfused on 10/2 Hb stable this morning At family's request-inpatient EGD/colonoscopy planned-however even with a 2-day prep-stools are still brown this morning.  Discussed with Judene will write for more prep today and see if they can postpone the colonoscopy for later this afternoon-otherwise it will be scheduled for tomorrow.  Spoke with sister-if unable to complete prep by tomorrow-she is looking at canceling the procedure-we discussed the option of placing an NG tube for the prep but she does not want to do that. Continue to follow CBC.    ESRD on HD TTS Nephrology following.  SLE Stable-continue prednisone /Plaquenil .  Chronic ascites-without formal diagnosis of cirrhosis complicated by SBP (Klebsiella 11/2023) CT reveals now a loculated effusion GI has offered workup for cirrhosis in the past-per prior documentation-both patient and family has refused. Currently appears stable-no abdominal pain-afebrile.  Chronic HFpEF Some mild lower extremity edema but otherwise stable Continue volume removal with HD  HTN BP soft but stable Coreg  on hold-but on midodrine for BP support.  Hypothyroidism Continue Synthroid   Debility/deconditioning Likely some debility at baseline-worse due to acute illness/GI bleeding PT/OT consulted.  Code status:   Code Status: Full Code   DVT Prophylaxis: SCDs Start: 01/24/24 0753   Family Communication:Sister-Michell [phone]-updated 10/3  Disposition Plan: Status is: Inpatient Remains  inpatient appropriate because: Severity of illness   Planned Discharge Destination:Home   Diet: Diet Order             Diet clear liquid Room service appropriate? Yes; Fluid consistency: Thin  Diet effective now                     Antimicrobial agents: Anti-infectives (From  admission, onward)    Start     Dose/Rate Route Frequency Ordered Stop   01/24/24 1000  hydroxychloroquine  (PLAQUENIL ) tablet 200 mg        200 mg Oral Daily 01/24/24 0833          MEDICATIONS: Scheduled Meds:  sodium chloride    Intravenous Once   calcium  acetate  667 mg Oral TID AC   Chlorhexidine  Gluconate Cloth  6 each Topical Q0600   darbepoetin (ARANESP ) injection - DIALYSIS  200 mcg Subcutaneous Q Tue-1800   feeding supplement  1 Container Oral TID BM   folic acid   1 mg Oral Daily   hydroxychloroquine   200 mg Oral Daily   levothyroxine   25 mcg Oral Q0600   midodrine  10 mg Oral TID WC   multivitamin  1 tablet Oral QHS   Na Sulfate-K Sulfate-Mg Sulfate concentrate  0.5 kit Oral Once   Na Sulfate-K Sulfate-Mg Sulfate concentrate  0.5 kit Oral Once   pantoprazole  (PROTONIX ) IV  40 mg Intravenous Q12H   predniSONE   20 mg Oral Daily   simethicone   240 mg Oral Once   simethicone   240 mg Oral Once   Continuous Infusions:  sodium chloride       PRN Meds:.traMADol    I have personally reviewed following labs and imaging studies  LABORATORY DATA: CBC: Recent Labs  Lab 01/24/24 0535 01/24/24 2036 01/26/24 0341 01/26/24 1635 01/27/24 0252 01/27/24 1538 01/28/24 0304  WBC 5.6   < > 5.8 11.8* 11.7* 14.2* 14.3*  NEUTROABS 3.7  --   --   --   --   --   --   HGB 5.3*   < > 9.7* 10.7* 9.4* 8.5* 9.8*  HCT 16.6*   < > 28.5* 31.4* 28.0* 25.2* 28.6*  MCV 98.8   < > 88.2 88.7 88.3 88.7 86.4  PLT 154   < > 122* 110* 100* 89* 75*   < > = values in this interval not displayed.    Basic Metabolic Panel: Recent Labs  Lab 01/24/24 0535 01/25/24 0257 01/26/24 0341 01/27/24 0252  NA 127* 127* 127* 131*  K 5.7* 5.8* 4.4 4.3  CL 96* 95* 96* 92*  CO2 22 22 23 22   GLUCOSE 128* 91 89 108*  BUN 97* 100* 52* 63*  CREATININE 6.19* 6.17* 4.07* 4.98*  CALCIUM  7.4* 7.2* 7.2* 7.4*  PHOS  --  4.6 4.2 4.5    GFR: Estimated Creatinine Clearance: 16 mL/min (A) (by C-G formula based  on SCr of 4.98 mg/dL (H)).  Liver Function Tests: Recent Labs  Lab 01/25/24 0257 01/26/24 0341 01/27/24 0252  ALBUMIN  <1.5* <1.5* <1.5*   No results for input(s): LIPASE, AMYLASE in the last 168 hours. No results for input(s): AMMONIA in the last 168 hours.  Coagulation Profile: Recent Labs  Lab 01/24/24 2036  INR 1.0    Cardiac Enzymes: No results for input(s): CKTOTAL, CKMB, CKMBINDEX, TROPONINI in the last 168 hours.  BNP (last 3 results) No results for input(s): PROBNP in the last 8760 hours.  Lipid Profile: No results for input(s): CHOL, HDL, LDLCALC, TRIG, CHOLHDL,  LDLDIRECT in the last 72 hours.  Thyroid  Function Tests: No results for input(s): TSH, T4TOTAL, FREET4, T3FREE, THYROIDAB in the last 72 hours.  Anemia Panel: No results for input(s): VITAMINB12, FOLATE, FERRITIN, TIBC, IRON, RETICCTPCT in the last 72 hours.  Urine analysis:    Component Value Date/Time   COLORURINE AMBER (A) 06/29/2021 0425   APPEARANCEUR HAZY (A) 06/29/2021 0425   LABSPEC 1.018 06/29/2021 0425   PHURINE 7.0 06/29/2021 0425   GLUCOSEU NEGATIVE 06/29/2021 0425   HGBUR SMALL (A) 06/29/2021 0425   BILIRUBINUR NEGATIVE 06/29/2021 0425   KETONESUR 5 (A) 06/29/2021 0425   PROTEINUR >=300 (A) 06/29/2021 0425   NITRITE NEGATIVE 06/29/2021 0425   LEUKOCYTESUR NEGATIVE 06/29/2021 0425    Sepsis Labs: Lactic Acid, Venous    Component Value Date/Time   LATICACIDVEN 2.0 (HH) 12/14/2023 1623    MICROBIOLOGY: No results found for this or any previous visit (from the past 240 hours).  RADIOLOGY STUDIES/RESULTS: No results found.    LOS: 4 days   Donalda Applebaum, MD  [facility]    To contact the attending provider between 7A-7P or the covering provider during after hours 7P-7A, please log into the web site [email or web address] and access using universal [place] password for that web site. If you do not have the password,  please call the hospital operator.  01/28/2024, 11:45 AM

## 2024-01-28 NOTE — Anesthesia Preprocedure Evaluation (Addendum)
 Anesthesia Evaluation  Patient identified by MRN, date of birth, ID band Patient awake    Reviewed: Allergy & Precautions, NPO status , Patient's Chart, lab work & pertinent test results  Airway Mallampati: Unable to assess  TM Distance: <3 FB Neck ROM: Full    Dental  (+) Poor Dentition, Missing, Chipped   Pulmonary neg pulmonary ROS    + decreased breath sounds      Cardiovascular hypertension, Pt. on medications and Pt. on home beta blockers +CHF  + Valvular Problems/Murmurs  Rhythm:Regular Rate:Normal     Neuro/Psych  PSYCHIATRIC DISORDERS      negative neurological ROS     GI/Hepatic Neg liver ROS,GERD  Medicated,,  Endo/Other  Hypothyroidism    Renal/GU ESRF and DialysisRenal disease     Musculoskeletal  (+) Arthritis ,    Abdominal   Peds  Hematology  (+) Blood dyscrasia, anemia   Anesthesia Other Findings   Reproductive/Obstetrics                              Anesthesia Physical Anesthesia Plan  ASA: 4  Anesthesia Plan: MAC   Post-op Pain Management: Minimal or no pain anticipated   Induction: Intravenous  PONV Risk Score and Plan: 0 and Propofol  infusion  Airway Management Planned: Natural Airway and Nasal Cannula  Additional Equipment: None  Intra-op Plan:   Post-operative Plan:   Informed Consent: I have reviewed the patients History and Physical, chart, labs and discussed the procedure including the risks, benefits and alternatives for the proposed anesthesia with the patient or authorized representative who has indicated his/her understanding and acceptance.       Plan Discussed with: CRNA  Anesthesia Plan Comments:          Anesthesia Quick Evaluation

## 2024-01-28 NOTE — Evaluation (Signed)
 Occupational Therapy Evaluation Patient Details Name: [name] MRN: [id number] DOB: 1970/09/28 Today's Date: 01/28/2024   History of Present Illness   Pt is a 53 y/o M admitted on 01/24/24 after presenting with rectal bleeding. PMH: ESRD on HD TTS, lupus, ascites, systolic CHF.     Clinical Impressions Patient admitted for the diagnosis above.  Presents with poor activity tolerance and severe weakness.  Unable to lift extremities off the bed in gravity eliminated position.  Declines any and all attempted movement.  Total A for simple bed mobility.  OT can attempt treatment again, but is patient continues to R, OT will sign off.  OT recommended SNF level rehab prior to going home given weakness, but patient stating his sister and aunt will have to care for him at home until he regains his strength.        If plan is discharge home, recommend the following:   A little help with walking and/or transfers;A little help with bathing/dressing/bathroom;Assist for transportation     Functional Status Assessment   Patient has had a recent decline in their functional status and demonstrates the ability to make significant improvements in function in a reasonable and predictable amount of time.     Equipment Recommendations   BSC/3in1     Recommendations for Other Services         Precautions/Restrictions   Precautions Precautions: Fall Recall of Precautions/Restrictions: Impaired Restrictions Weight Bearing Restrictions Per Provider Order: No     Mobility Bed Mobility Overal bed mobility: Needs Assistance Bed Mobility: Rolling Rolling: Total assist              Transfers                   General transfer comment: Patient declined EOB      Balance                                           ADL either performed or assessed with clinical judgement   ADL       Grooming: Maximal assistance;Bed level   Upper Body Bathing:  Maximal assistance;Bed level   Lower Body Bathing: Total assistance;Bed level   Upper Body Dressing : Maximal assistance;Bed level   Lower Body Dressing: Total assistance;Bed level                 General ADL Comments: R OOB     Vision Patient Visual Report: No change from baseline       Perception Perception: Not tested       Praxis Praxis: Not tested       Pertinent Vitals/Pain Pain Assessment Pain Assessment: No/denies pain Pain Intervention(s): Monitored during session     Extremity/Trunk Assessment Upper Extremity Assessment Upper Extremity Assessment: Generalized weakness;Right hand dominant   Lower Extremity Assessment Lower Extremity Assessment: Defer to PT evaluation   Cervical / Trunk Assessment Cervical / Trunk Assessment: Normal   Communication Communication Communication: No apparent difficulties   Cognition Arousal: Alert Behavior During Therapy: WFL for tasks assessed/performed Cognition: No apparent impairments                               Following commands: Intact       Cueing  General Comments   Cueing Techniques: Verbal cues   Soft BP  Exercises     Shoulder Instructions      Home Living Family/patient expects to be discharged to:: Private residence Living Arrangements: Alone Available Help at Discharge: Family;Available 24 hours/day Type of Home: House Home Access: Stairs to enter [employer] of Steps: 3   Home Layout: Two level;Able to live on main level with bedroom/bathroom     Bathroom Shower/Tub: Producer, television/film/video: Standard Bathroom Accessibility: Yes How Accessible: Accessible via wheelchair Home Equipment: Rolling Walker (2 wheels);Shower seat;Wheelchair - power   Additional Comments: lives alone, states sister and aunt will have to help him      Prior Functioning/Environment Prior Level of Function : Needs assist             Mobility Comments: Was able  to walk short household distances. ADLs Comments: Mod I with bathing and dressing, declining function due to increasing weakness.    OT Problem List: Decreased strength;Decreased activity tolerance   OT Treatment/Interventions: Self-care/ADL training;Therapeutic exercise;Energy conservation;DME and/or AE instruction;Therapeutic activities;Patient/family education;Balance training      OT Goals(Current goals can be found in the care plan section)   Acute Rehab OT Goals Patient Stated Goal: Home OT Goal Formulation: With patient Time For Goal Achievement: 02/11/24 Potential to Achieve Goals: Fair ADL Goals Pt Will Perform Grooming: with min assist;sitting Pt Will Perform Upper Body Bathing: with min assist;sitting Pt Will Perform Upper Body Dressing: with min assist;sitting Pt Will Transfer to Toilet: stand pivot transfer;bedside commode;with mod assist Pt/caregiver will Perform Home Exercise Program: Increased strength;Both right and left upper extremity;With theraband;With minimal assist   OT Frequency:  Min 1X/week    Co-evaluation              AM-PAC OT 6 Clicks Daily Activity     Outcome Measure Help from another person eating meals?: A Lot Help from another person taking care of personal grooming?: A Lot Help from another person toileting, which includes using toliet, bedpan, or urinal?: Total Help from another person bathing (including washing, rinsing, drying)?: A Lot Help from another person to put on and taking off regular upper body clothing?: A Lot Help from another person to put on and taking off regular lower body clothing?: Total 6 Click Score: 10   End of Session Nurse Communication: Other (comment) (R majority of assessment)  Activity Tolerance: Patient limited by fatigue Patient left: in bed;with call bell/phone within reach  OT Visit Diagnosis: Muscle weakness (generalized) (M62.81)                Time: 9181-9164 OT Time Calculation (min): 17  min Charges:  OT General Charges $OT Visit: 1 Visit OT Evaluation $OT Eval Moderate Complexity: 1 Mod  01/28/2024  RP, OTR/L  Acute Rehabilitation Services  Office:  [phone]   [name] 01/28/2024, 8:44 AM

## 2024-01-28 NOTE — Progress Notes (Signed)
 [facility] Progress Note   Subjective:    Completed dialysis yesterday- net UF 2L Seen in room. Feels weak and having continued abdominal pain but doesn't think he has had any more bleeding.   Objective Vitals:   01/27/24 2025 01/27/24 2339 01/28/24 0400 01/28/24 0800  BP: 109/65 (!) 94/53 (!) 91/49 (!) 107/50  Pulse: 97 88  88  Resp: (!) 9 17 (!) 9 15  Temp: 97.8 F (36.6 C) (!) 97.5 F (36.4 C)  97.9 F (36.6 C)  TempSrc: Oral Oral  Oral  SpO2:      Weight:      Height:       Physical Exam General: Chronically ill appearing, frail, on room air  Heart: RRR Lungs: CTA anterolaterally Abdomen: Distended, non-tender.  Extremities: 1+ BLE edema Dialysis Access: L forearm AVF +t/b  Additional Objective Labs: Basic Metabolic Panel: Recent Labs  Lab 01/25/24 0257 01/26/24 0341 01/27/24 0252  NA 127* 127* 131*  K 5.8* 4.4 4.3  CL 95* 96* 92*  CO2 22 23 22   GLUCOSE 91 89 108*  BUN 100* 52* 63*  CREATININE 6.17* 4.07* 4.98*  CALCIUM  7.2* 7.2* 7.4*  PHOS 4.6 4.2 4.5   Liver Function Tests: Recent Labs  Lab 01/25/24 0257 01/26/24 0341 01/27/24 0252  ALBUMIN  <1.5* <1.5* <1.5*   CBC: Recent Labs  Lab 01/24/24 0535 01/24/24 2036 01/26/24 0341 01/26/24 1635 01/27/24 0252 01/27/24 1538 01/28/24 0304  WBC 5.6   < > 5.8 11.8* 11.7* 14.2* 14.3*  NEUTROABS 3.7  --   --   --   --   --   --   HGB 5.3*   < > 9.7* 10.7* 9.4* 8.5* 9.8*  HCT 16.6*   < > 28.5* 31.4* 28.0* 25.2* 28.6*  MCV 98.8   < > 88.2 88.7 88.3 88.7 86.4  PLT 154   < > 122* 110* 100* 89* 75*   < > = values in this interval not displayed.   Blood Culture    Component Value Date/Time   SDES PERITONEAL 12/27/2023 1049   SDES PERITONEAL 12/27/2023 1049   SPECREQUEST NONE 12/27/2023 1049   SPECREQUEST NONE 12/27/2023 1049   CULT  12/27/2023 1049    NO GROWTH 3 DAYS Performed at [facility], [address], [place], [place] [address]    CULT  12/27/2023 1049    NO  FUNGUS ISOLATED AFTER 21 DAYS Performed at [facility], [address], [place], [place] [address]    REPTSTATUS 12/30/2023 FINAL 12/27/2023 1049   REPTSTATUS 01/17/2024 FINAL 12/27/2023 1049   Studies/Results: No results found.  Medications:  sodium chloride        sodium chloride    Intravenous Once   calcium  acetate  667 mg Oral TID AC   Chlorhexidine  Gluconate Cloth  6 each Topical Q0600   darbepoetin (ARANESP ) injection - DIALYSIS  200 mcg Subcutaneous Q Tue-1800   feeding supplement  1 Container Oral TID BM   folic acid   1 mg Oral Daily   hydroxychloroquine   200 mg Oral Daily   levothyroxine   25 mcg Oral Q0600   midodrine  10 mg Oral TID WC   multivitamin  1 tablet Oral QHS   Na Sulfate-K Sulfate-Mg Sulfate concentrate  0.5 kit Oral Once   Na Sulfate-K Sulfate-Mg Sulfate concentrate  0.5 kit Oral Once   pantoprazole  (PROTONIX ) IV  40 mg Intravenous Q12H   polyethylene glycol powder  119 g Oral Once   predniSONE   20 mg Oral Daily   simethicone   240 mg Oral Once   simethicone   240 mg Oral Once    Dialysis Orders TTS - AF 3:45hr, 400/A1.5, EDW 58kg, 2K/2Ca bath, AVF, no heparin  - Last HD 9/25, left at 65.7kg - up on fluids - Mircera 225mcg IV q 2 weeks - last given 9/16. Last Hgb 8.6 on 9/25 - Hectoral on hold   Assessment/Plan: BRBPR, ?hemorrhoidal bleeding: Hgb down on admit, s/p 4 u prbcs so far.  GI following - appears now planning for EGD/colonoscopy at some point.  Hyperkalemia: Resolved  ESRD:  Usual TTS schedule. HD 10/4.  Hypotension/volume: BP soft. Does have some edema. UF as tolerated. On midodrine 10 mg TID  Anemia: Received Aranesp  200 on 9/30. Continue weekly  Metabolic bone disease: CorrCa ok/Phos ok for now. SLE: On Plaquenil  and steroids. Recurrent ascites c/b recent peritonitis.CT with loculated fluid going into R scrotum. Defer to primary    Maisie Ronnald Acosta PA-C [facility] 01/28/2024,9:01 AM

## 2024-01-29 ENCOUNTER — Encounter (HOSPITAL_COMMUNITY): Admission: EM | Disposition: A | Payer: Self-pay | Source: Home / Self Care | Attending: Internal Medicine

## 2024-01-29 ENCOUNTER — Encounter (HOSPITAL_COMMUNITY): Payer: Self-pay | Admitting: Hospitalist

## 2024-01-29 ENCOUNTER — Inpatient Hospital Stay (HOSPITAL_COMMUNITY): Payer: Self-pay | Admitting: Anesthesiology

## 2024-01-29 ENCOUNTER — Inpatient Hospital Stay (HOSPITAL_COMMUNITY)

## 2024-01-29 DIAGNOSIS — K625 Hemorrhage of anus and rectum: Secondary | ICD-10-CM | POA: Diagnosis not present

## 2024-01-29 HISTORY — PX: COLONOSCOPY: SHX5424

## 2024-01-29 HISTORY — PX: ESOPHAGOGASTRODUODENOSCOPY: SHX5428

## 2024-01-29 HISTORY — PX: BONE BIOPSY: SHX375

## 2024-01-29 LAB — CBC WITH DIFFERENTIAL/PLATELET
Basophils Absolute: 0 K/uL (ref 0.0–0.1)
Basophils Relative: 0 %
Eosinophils Absolute: 0 K/uL (ref 0.0–0.5)
Eosinophils Relative: 0 %
HCT: 28.4 % — ABNORMAL LOW (ref 39.0–52.0)
Hemoglobin: 9.7 g/dL — ABNORMAL LOW (ref 13.0–17.0)
Lymphocytes Relative: 2 %
Lymphs Abs: 0.3 K/uL — ABNORMAL LOW (ref 0.7–4.0)
MCH: 29.3 pg (ref 26.0–34.0)
MCHC: 34.2 g/dL (ref 30.0–36.0)
MCV: 85.8 fL (ref 80.0–100.0)
Monocytes Absolute: 0 K/uL — ABNORMAL LOW (ref 0.1–1.0)
Monocytes Relative: 0 %
Neutro Abs: 12.3 K/uL — ABNORMAL HIGH (ref 1.7–7.7)
Neutrophils Relative %: 98 %
Platelets: 77 K/uL — ABNORMAL LOW (ref 150–400)
RBC: 3.31 MIL/uL — ABNORMAL LOW (ref 4.22–5.81)
RDW: 17.4 % — ABNORMAL HIGH (ref 11.5–15.5)
WBC: 12.5 K/uL — ABNORMAL HIGH (ref 4.0–10.5)
nRBC: 0 % (ref 0.0–0.2)

## 2024-01-29 LAB — MAGNESIUM: Magnesium: 2.1 mg/dL (ref 1.7–2.4)

## 2024-01-29 LAB — COMPREHENSIVE METABOLIC PANEL WITH GFR
ALT: 9 U/L (ref 0–44)
AST: 17 U/L (ref 15–41)
Albumin: 1.5 g/dL — ABNORMAL LOW (ref 3.5–5.0)
Alkaline Phosphatase: 83 U/L (ref 38–126)
Anion gap: 16 — ABNORMAL HIGH (ref 5–15)
BUN: 50 mg/dL — ABNORMAL HIGH (ref 6–20)
CO2: 23 mmol/L (ref 22–32)
Calcium: 7.4 mg/dL — ABNORMAL LOW (ref 8.9–10.3)
Chloride: 94 mmol/L — ABNORMAL LOW (ref 98–111)
Creatinine, Ser: 4.38 mg/dL — ABNORMAL HIGH (ref 0.61–1.24)
GFR, Estimated: 15 mL/min — ABNORMAL LOW (ref >60)
Glucose, Bld: 87 mg/dL (ref 70–99)
Potassium: 3.1 mmol/L — ABNORMAL LOW (ref 3.5–5.1)
Sodium: 133 mmol/L — ABNORMAL LOW (ref 135–145)
Total Bilirubin: 1.1 mg/dL (ref 0.0–1.2)
Total Protein: 4.7 g/dL — ABNORMAL LOW (ref 6.5–8.1)

## 2024-01-29 SURGERY — COLONOSCOPY
Anesthesia: Monitor Anesthesia Care

## 2024-01-29 MED ORDER — POTASSIUM CHLORIDE 10 MEQ/100ML IV SOLN
10.0000 meq | INTRAVENOUS | Status: AC
  Administered 2024-01-29: 10 meq via INTRAVENOUS
  Filled 2024-01-29: qty 100

## 2024-01-29 MED ORDER — LACTATED RINGERS IV BOLUS
500.0000 mL | Freq: Once | INTRAVENOUS | Status: AC
  Administered 2024-01-29: 500 mL via INTRAVENOUS

## 2024-01-29 MED ORDER — LIDOCAINE 2% (20 MG/ML) 5 ML SYRINGE
INTRAMUSCULAR | Status: DC | PRN
  Administered 2024-01-29: 60 mg via INTRAVENOUS

## 2024-01-29 MED ORDER — SODIUM CHLORIDE 0.9 % IV SOLN: INTRAVENOUS | Status: DC | PRN

## 2024-01-29 MED ORDER — PANTOPRAZOLE SODIUM 40 MG PO TBEC
40.0000 mg | DELAYED_RELEASE_TABLET | Freq: Two times a day (BID) | ORAL | Status: DC
  Administered 2024-01-29 – 2024-01-31 (×4): 40 mg via ORAL
  Filled 2024-01-29 (×5): qty 1

## 2024-01-29 MED ORDER — PHENYLEPHRINE 80 MCG/ML (10ML) SYRINGE FOR IV PUSH (FOR BLOOD PRESSURE SUPPORT)
PREFILLED_SYRINGE | INTRAVENOUS | Status: DC | PRN
  Administered 2024-01-29 (×2): 160 ug via INTRAVENOUS

## 2024-01-29 MED ORDER — ALBUMIN HUMAN 25 % IV SOLN
50.0000 g | Freq: Once | INTRAVENOUS | Status: DC
  Filled 2024-01-29: qty 200

## 2024-01-29 MED ORDER — POLYETHYLENE GLYCOL 3350 17 G PO PACK
17.0000 g | PACK | Freq: Three times a day (TID) | ORAL | Status: DC
  Administered 2024-01-30: 17 g via ORAL
  Filled 2024-01-29 (×4): qty 1

## 2024-01-29 MED ORDER — PROPOFOL 500 MG/50ML IV EMUL
INTRAVENOUS | Status: DC | PRN
  Administered 2024-01-29: 70 ug/kg/min via INTRAVENOUS

## 2024-01-29 MED ORDER — MIDODRINE HCL 5 MG PO TABS
ORAL_TABLET | ORAL | Status: AC
  Filled 2024-01-29: qty 2

## 2024-01-29 NOTE — Op Note (Addendum)
 [facility] Patient Name: [name] Procedure Date : 01/29/2024 MRN: [id number] Attending MD: Belvie Just , MD, [id number] Date of Birth: 1970-05-07 CSN: [id number] Age: 53 Admit Type: Inpatient Procedure:                Upper GI endoscopy Indications:              Iron deficiency anemia Providers:                Belvie Just, MD, Collene Edu, RN, Curtistine Bishop,                            Technician Referring MD:              Medicines:                Propofol  per Anesthesia Complications:            No immediate complications. Estimated Blood Loss:     Estimated blood loss: none. Procedure:                Pre-Anesthesia Assessment:                           - Prior to the procedure, a History and Physical                            was performed, and patient medications and                            allergies were reviewed. The patient's tolerance of                            previous anesthesia was also reviewed. The risks                            and benefits of the procedure and the sedation                            options and risks were discussed with the patient.                            All questions were answered, and informed consent                            was obtained. Prior Anticoagulants: The patient has                            taken no anticoagulant or antiplatelet agents. ASA                            Grade Assessment: III - A patient with severe                            systemic disease. After reviewing the risks and  benefits, the patient was deemed in satisfactory                            condition to undergo the procedure.                           - Sedation was administered by an anesthesia                            professional. Deep sedation was attained.                           After obtaining informed consent, the endoscope was                            passed under direct vision.  Throughout the                            procedure, the patient's blood pressure, pulse, and                            oxygen saturations were monitored continuously. The                            [id number]-HQ190L ([id number] Olympus colonoscope was                            introduced through the mouth, and advanced to the                            second part of duodenum. The upper GI endoscopy was                            accomplished without difficulty. The patient                            tolerated the procedure well. Scope In: Scope Out: Findings:      LA Grade D (one or more mucosal breaks involving at least 75% of       esophageal circumference) esophagitis with no bleeding was found in the       middle and lower thirds of the esophagus.      Severe portal hypertensive gastropathy was found in the entire examined       stomach.      The examined duodenum was normal.      Entry into the esophagus revealed a significant amount of stasis of       fluid in the esophagus. The esophagus was not able to be clearly       visualized until the large amount of gastric fluid was aspirated out of       the lumen. Once this was clear the esophagus clearly showed an LA Grade       D esophagitis. This was from the reflux. There was no evidence of any       esophageal varices. In the gastric lumen there was evidence of fresh  blood in the fundus. With repeated washing it was clear that the       bleeding was from the portal HTN gastropathy. Spontaneous oozing was       noted as well as friability. There was no evidence of any gastric       varices. Impression:               - LA Grade D reflux esophagitis with no bleeding.                           - Portal hypertensive gastropathy.                           - Normal examined duodenum.                           - No specimens collected. Recommendation:           - If the patient continues to have anemia, with                             resolution of the rectal bleeding, octreotide 100                            mcg IV then 25 mcg/hour to treat the bleeding.                           - If tolerated he can be started on a beta blocker                            in the near future.                           - Try to avoid lying down after eating or drinking.                           - Maintain pantoprazole  40 mg BID. He can be                            converted to PO. If this vails, Voqueznea 20 mg QD,                            but this can be determined as an outpatient. Procedure Code(s):        --- Professional ---                           [phone], Esophagogastroduodenoscopy, flexible,                            transoral; diagnostic, including collection of                            specimen(s) by brushing or washing, when performed                            (  separate procedure) Diagnosis Code(s):        --- Professional ---                           K21.00, Gastro-esophageal reflux disease with                            esophagitis, without bleeding                           K76.6, Portal hypertension                           K31.89, Other diseases of stomach and duodenum                           D50.9, Iron deficiency anemia, unspecified CPT copyright 2022 American Medical Association. All rights reserved. The codes documented in this report are preliminary and upon coder review may  be revised to meet current compliance requirements. Belvie Just, MD Belvie Just, MD 01/29/2024 9:12:45 AM This report has been signed electronically. Number of Addenda: 0

## 2024-01-29 NOTE — Progress Notes (Signed)
 [facility] Progress Note   Subjective:   Patient seen and examined at bedside post procedure this AM.  Reports he was told he may have an ulcer causing the bleeding.  Denies bleeding overnight. Complains of weakness, fatigue, abdominal bloating and discomfort.  Denies chest pain, SOB, nausea and vomiting.  Continues to have LE edema.    Objective Vitals:   01/29/24 1513 01/29/24 1516 01/29/24 1530 01/29/24 1600  BP: (!) 123/25 (!) 137/44 (!) 136/42 (!) 130/33  Pulse: 83 86 80 89  Resp: 12 12 12 17   Temp:      TempSrc:      SpO2: 100% 100% 100% 100%  Weight:      Height:       Physical Exam General:chronically ill appearing male in NAD Heart:RRR Lungs:CTAB, nml WOB on RA Abdomen:distended, mildly tender Extremities:1+ LE edema Dialysis Access: LU AVF +b/t   Filed Weights   01/27/24 1525 01/27/24 1939 01/29/24 1344  Weight: 68.4 kg 66.9 kg 66.1 kg    Intake/Output Summary (Last 24 hours) at 01/29/2024 1618 Last data filed at 01/29/2024 0848 Gross per 24 hour  Intake 100 ml  Output --  Net 100 ml    Additional Objective Labs: Basic Metabolic Panel: Recent Labs  Lab 01/25/24 0257 01/26/24 0341 01/27/24 0252 01/29/24 0453  NA 127* 127* 131* 133*  K 5.8* 4.4 4.3 3.1*  CL 95* 96* 92* 94*  CO2 22 23 22 23   GLUCOSE 91 89 108* 87  BUN 100* 52* 63* 50*  CREATININE 6.17* 4.07* 4.98* 4.38*  CALCIUM  7.2* 7.2* 7.4* 7.4*  PHOS 4.6 4.2 4.5  --    Liver Function Tests: Recent Labs  Lab 01/26/24 0341 01/27/24 0252 01/29/24 0453  AST  --   --  17  ALT  --   --  9  ALKPHOS  --   --  83  BILITOT  --   --  1.1  PROT  --   --  4.7*  ALBUMIN  <1.5* <1.5* 1.5*    CBC: Recent Labs  Lab 01/24/24 0535 01/24/24 2036 01/26/24 1635 01/27/24 0252 01/27/24 1538 01/28/24 0304 01/29/24 0453  WBC 5.6   < > 11.8* 11.7* 14.2* 14.3* 12.5*  NEUTROABS 3.7  --   --   --   --   --  12.3*  HGB 5.3*   < > 10.7* 9.4* 8.5* 9.8* 9.7*  HCT 16.6*   < > 31.4* 28.0*  25.2* 28.6* 28.4*  MCV 98.8   < > 88.7 88.3 88.7 86.4 85.8  PLT 154   < > 110* 100* 89* 75* 77*   < > = values in this interval not displayed.   Blood Culture    Component Value Date/Time   SDES PERITONEAL 12/27/2023 1049   SDES PERITONEAL 12/27/2023 1049   SPECREQUEST NONE 12/27/2023 1049   SPECREQUEST NONE 12/27/2023 1049   CULT  12/27/2023 1049    NO GROWTH 3 DAYS Performed at [facility], [address] [place], [place] [address]    CULT  12/27/2023 1049    NO FUNGUS ISOLATED AFTER 21 DAYS Performed at [facility], [address] [place], [place] [address]    REPTSTATUS 12/30/2023 FINAL 12/27/2023 1049   REPTSTATUS 01/17/2024 FINAL 12/27/2023 1049    Studies/Results: US  ASCITES (ABDOMEN LIMITED) Result Date: 01/29/2024 CLINICAL DATA:  Patient with a history of ESRD with recurrent ascites. Interventional Radiology asked to evaluate patient for a diagnostic and therapeutic paracentesis. EXAM:  LIMITED ABDOMEN ULTRASOUND FOR ASCITES TECHNIQUE: Limited ultrasound survey for ascites was performed in all four abdominal quadrants. COMPARISON:  None Available. FINDINGS: Limited ultrasound evaluation of the abdomen revealed small, scattered pockets of fluid and large, dilated loops of bowel. No safe percutaneous window for paracentesis. No procedure performed. IMPRESSION: Small volume ascites and large, dilated loops of bowel identified. Fluid volume insufficient to safely perform procedure. Ultrasound images reviewed by: Warren Dais, NP Electronically Signed   By: Cordella Banner   On: 01/29/2024 13:06    Medications:  albumin  human Stopped (01/29/24 1244)    sodium chloride    Intravenous Once   calcium  acetate  667 mg Oral TID AC   Chlorhexidine  Gluconate Cloth  6 each Topical Q0600   darbepoetin (ARANESP ) injection - DIALYSIS  200 mcg Subcutaneous Q Tue-1800   feeding supplement  1 Container Oral TID BM   folic acid   1 mg Oral Daily   hydroxychloroquine   200 mg  Oral Daily   levothyroxine   25 mcg Oral Q0600   midodrine  10 mg Oral TID WC   multivitamin  1 tablet Oral QHS   pantoprazole  (PROTONIX ) IV  40 mg Intravenous Q12H   polyethylene glycol  17 g Oral TID   predniSONE   20 mg Oral Daily   simethicone   240 mg Oral Once   simethicone   240 mg Oral Once    Dialysis Orders: TTS - AF 3:45hr, 400/A1.5, EDW 58kg, 2K/2Ca bath, AVF, no heparin  - Last HD 9/25, left at 65.7kg - up on fluids - Mircera 225mcg IV q 2 weeks - last given 9/16. Last Hgb 8.6 on 9/25 - Hectoral on hold   Assessment/Plan: BRBPR, ?hemorrhoidal bleeding: Hgb down on admit, s/p 6 u prbcs so far.  GI following - EGD this AM with LA grade D reflux esophagitis w/o bleeding, portal hypertensive gastropathy w/spontaneous oozing and friability. No varices. Colonoscopy with single ulcer in rectum - biopsied, and few ulcers in distal transverse colon & congested mucosa throughout. Medical management per GI.   ESRD:  Usual TTS schedule. HD 10/4.  Hypotension/volume: BP soft. Does have some edema. UF as tolerated. On midodrine 10 mg TID  Acute on chronic Anemia: 2/2 #1. S/p 6 units pRBC. Received Aranesp  200 on 9/30. Continue weekly. Hgb 9.7. Metabolic bone disease: CorrCa ok/Phos ok for now. SLE: On Plaquenil  and steroids. Recurrent ascites c/b recent peritonitis.CT with loculated fluid going into R scrotum. paracentesis ordered - Small volume ascites and large, dilated loops of bowel identified. Fluid volume insufficient to safely perform procedure.  Manuelita Labella, PA-C [facility] 01/29/2024,4:18 PM  LOS: 5 days

## 2024-01-29 NOTE — Progress Notes (Signed)
 Patient refused to drink further bowel prep saying he already had enough and cannot make it more than that.This RN try to convince the pt of its consequences of not having a proper bowel preparation. Patient voiced understanding.

## 2024-01-29 NOTE — Care Plan (Signed)
     Referral received for [facility]: goals of care discussion. Chart reviewed. Patient is dialysis.   PMT will re-attempt to contact patient at a later time/date. Detailed note and recommendations to follow once GOC has been completed.   Thank you for your referral and allowing PMT to assist in [place] Shing[place]ecker's care.   Stephane Palin, NP Palliative Medicine Team  Team Phone # [phone]   NO CHARGE

## 2024-01-29 NOTE — Op Note (Signed)
 [facility] Patient Name: [name] Procedure Date : 01/29/2024 MRN: [id number] Attending MD: Belvie Just , MD, [id number] Date of Birth: [birth year] CSN: [id number] Age: 53 Admit Type: Inpatient Procedure:                Colonoscopy Indications:              Hematochezia Providers:                Belvie Just, MD, Collene Edu, RN, Curtistine Bishop,                            Technician Referring MD:              Medicines:                Propofol  per Anesthesia Complications:            No immediate complications. Estimated Blood Loss:     Estimated blood loss was minimal. Procedure:                Pre-Anesthesia Assessment:                           - Prior to the procedure, a History and Physical                            was performed, and patient medications and                            allergies were reviewed. The patient's tolerance of                            previous anesthesia was also reviewed. The risks                            and benefits of the procedure and the sedation                            options and risks were discussed with the patient.                            All questions were answered, and informed consent                            was obtained. Prior Anticoagulants: The patient has                            taken no anticoagulant or antiplatelet agents. ASA                            Grade Assessment: III - A patient with severe                            systemic disease. After reviewing the risks and                            benefits,  the patient was deemed in satisfactory                            condition to undergo the procedure.                           - Sedation was administered by an anesthesia                            professional. Deep sedation was attained.                           After obtaining informed consent, the colonoscope                            was passed under direct vision. Throughout the                             procedure, the patient's blood pressure, pulse, and                            oxygen saturations were monitored continuously. The                            PCF-HQ190L ([id number] Olympus colonoscope was                            introduced through the anus and advanced to the the                            cecum, identified by appendiceal orifice and                            ileocecal valve. The colonoscopy was performed                            without difficulty. The patient tolerated the                            procedure well. The quality of the bowel                            preparation was adequate. The ileocecal valve,                            appendiceal orifice, and rectum were photographed. Scope In: 8:17:36 AM Scope Out: 8:37:39 AM Scope Withdrawal Time: 0 hours 17 minutes 12 seconds  Total Procedure Duration: 0 hours 20 minutes 3 seconds  Findings:      A single (solitary) fifteen mm ulcer was found in the rectum. Oozing was       present. Stigmata of recent bleeding were present. Biopsies were taken       with a cold forceps for histology.      A few five mm ulcers were found in the distal transverse colon. No  bleeding was present. No stigmata of recent bleeding were seen. Biopsies       were taken with a cold forceps for histology.      An area of moderately congested mucosa was found in the entire colon.      A single medium-mouthed diverticulum was found in the sigmoid colon.      The colonic lumen was edematous. It was difficult to properly       insufflation for the majority of the colon. In the distal transverse       colon there was evidence of two superficial ulcers. These ulcers were       friable and cold biopsies were obtained. In the rectum there was       evidence of an anterior linearly and partially circumfirential stercoral       ulcer. This was just above the dentate line. This area was very friable       and bled  with contact and lavage. Cold biopsies were obtained. Impression:               - A single (solitary) ulcer in the rectum. Biopsied.                           - A few ulcers in the distal transverse colon.                            Biopsied.                           - Congested mucosa in the entire examined colon.                           - Diverticulosis in the sigmoid colon. Recommendation:           - Return patient to hospital ward for ongoing care.                           - Low sodium diet.                           - Continue present medications.                           - Await pathology results.                           - Maintain laxatives to allow for healing of the                            stercoral ulcer. Procedure Code(s):        --- Professional ---                           [phone], Colonoscopy, flexible; with biopsy, single                            or multiple Diagnosis Code(s):        --- Professional ---  K62.6, Ulcer of anus and rectum                           K63.3, Ulcer of intestine                           K63.89, Other specified diseases of intestine                           K92.1, Melena (includes Hematochezia)                           K57.30, Diverticulosis of large intestine without                            perforation or abscess without bleeding CPT copyright 2022 [employer]. All rights reserved. The codes documented in this report are preliminary and upon coder review may  be revised to meet current compliance requirements. Belvie Just, MD Belvie Just, MD 01/29/2024 9:20:02 AM This report has been signed electronically. Number of Addenda: 0

## 2024-01-29 NOTE — Progress Notes (Addendum)
 PROGRESS NOTE        PATIENT DETAILS Name: [name] Age: 53 y.o. Sex: male Date of Birth: [birth year] Admit Date: 01/24/2024 Admitting Physician Marsha Ada, MD ERE:[email or web address], Elsie Sayre, MD  Brief Summary: Patient is a 53 y.o.  male with history of ESRD, ascites without formal diagnosis of liver cirrhosis, SBP (Klebsiella on peritoneal culture on 8/19), chronic HFrEF-presented with lower GI bleeding with acute blood loss anemia.  Significant events: 9/29>> admit to [facility]  Significant studies:  9/29>> CT abdomen/pelvis: Large loculated peritoneal fluid collection left abdomen/midline lower abdomen-tracks into right inguinal canal. 9/30>> CT angio GI bleed study: No extravasation.  Severe diverticulosis.  10/4.  Colonoscopy by Dr. Rollin.  Impression: A single (solitary) ulcer in the rectum. Biopsied. A few ulcers in the distal transverse colon. Biopsied. Congested mucosa in the entire examined colon. Diverticulosis in the sigmoid colon.  Recommendation: Return patient to hospital ward for ongoing care. Maintain laxatives to allow for healing of the stercoral ulcer.  10/4.  EGD by Dr. Rollin  Impression:  LA Grade D reflux esophagitis with no bleeding. Portal hypertensive gastropathy. Normal examined duodenum. No specimens collected.  Recommendation: If the patient continues to have anemia, with resolution of the rectal bleeding, octreotide 100 mcg IV then 25 mcg/hour to treat the bleeding. If tolerated he can be started on a beta blocker in the near future. Try to avoid lying down after eating or drinking. Maintain pantoprazole  40 mg BID. He can be converted to PO. If this vails, Voqueznea 20 mg QD, but this can be determined as an outpatient.  10/4.  Ultrasound-guided therapeutic and diagnostic paracentesis also requested by IR.  Significant microbiology data: None  Consults: Nephrology GI Palliative care  Subjective: Patient in bed denies any  headache, says abdomen feels distended since this morning, had colonoscopy few minutes ago, no further blood in stool, no focal weakness  Objective: Vitals: Blood pressure (!) 101/56, pulse 86, temperature 98.3 F (36.8 C), resp. rate 15, height 5' 11 (1.803 m), weight 66.9 kg, SpO2 97%.   Exam:  Cachectic middle-aged gentleman in hospital bed, awake Alert, No new F.N deficits,  [place].AT,PERRAL Supple Neck, No JVD,   Symmetrical Chest wall movement, Good air movement bilaterally, CTAB RRR,No Gallops, Rubs or new Murmurs,  +ve B.Sounds, Abd is somewhat distended, mild generalized tenderness No Cyanosis, Clubbing or edema    Assessment/Plan:  Lower GI bleeding with acute blood loss anemia likely coming from chronic constipation: stercoral ulcer Last episode of hematochezia on 10/2 afternoon, total of 5 units of packed RBC last transfusion on 01/27/2024.  Currently H&H stable, underwent EGD colonoscopy on 01/29/2024, colonoscopy suggestive of stercoral ulcer, likely from chronic constipation and rectal pressure.  EGD nonacute with some portal hypertensive gastropathy with minimal bleeding if any, on PPI for now.  Continue bowel regimen, continue to monitor CBC.  Discussed with GI Dr. Rollin on 01/29/2024  ESRD on HD TTS Nephrology following.  SLE Stable-continue prednisone /Plaquenil .  Chronic ascites-without formal diagnosis of cirrhosis complicated by SBP (Klebsiella 11/2023) CT reveals now a loculated effusion GI has offered workup for cirrhosis in the past-per prior documentation-both patient and family has refused. Currently complaining of abdominal distention since this early morning before colonoscopy, ultrasound-guided therapeutic and diagnostic paracentesis with albumin  and fluids ordered on 01/29/2024, if enough fluid is found we will remove.  Chronic HFpEF  Some mild lower extremity edema but otherwise stable Continue volume removal with HD  HTN BP soft but stable Coreg  on  hold-but on midodrine for BP support.  Hypothyroidism Continue Synthroid   Debility/deconditioning Likely some debility at baseline-worse due to acute illness/GI bleeding, overall extremely weak and cachectic, will also involve palliative care for long-term goals of care PT/OT consulted.  Code status:   Code Status: Full Code   DVT Prophylaxis: SCDs Start: 01/24/24 0753   Family Communication:Sister-Michell White-[phone]-updated 01/29/24, agrees with Pall care input  Disposition Plan: Status is: Inpatient Remains inpatient appropriate because: Severity of illness   Planned Discharge Destination:Home   Diet: Diet Order             Diet 2 gram sodium Room service appropriate? Yes; Fluid consistency: Thin  Diet effective now                     Antimicrobial agents: Anti-infectives (From admission, onward)    Start     Dose/Rate Route Frequency Ordered Stop   01/24/24 1000  hydroxychloroquine  (PLAQUENIL ) tablet 200 mg        200 mg Oral Daily 01/24/24 0833         Data Review:   Patient Lines/Drains/Airways Status     Active Line/Drains/Airways     Name Placement date Placement time Site Days   Peripheral IV 01/24/24 22 G 1.75 Anterior;Proximal;Right Forearm 01/24/24  0733  Forearm  5   Peripheral IV 01/25/24 20 G 1.88 Right Antecubital 01/25/24  1840  Antecubital  4   Fistula / Graft Left Forearm Arteriovenous fistula 08/26/20  0807  Forearm  1251   Wound 01/27/24 2000 Other (Comment) Foot Anterior;Right 01/27/24  2000  Foot  2   Wound 01/27/24 2000 Other (Comment) Ischial tuberosity Left 01/27/24  2000  Ischial tuberosity  2   Wound 01/27/24 2000 Other (Comment) Foot Anterior;Left 01/27/24  2000  Foot  2   Wound 01/27/24 2000 Other (Comment) Ankle Anterior;Right 01/27/24  2000  Ankle  2   Wound 01/27/24 2000 01/27/24  2000  --  2   Wound 01/27/24 2000 Other (Comment) Arm Left;Lower;Posterior;Proximal 01/27/24  2000  Arm  2              Inpatient Medications  Scheduled Meds:  sodium chloride    Intravenous Once   calcium  acetate  667 mg Oral TID AC   Chlorhexidine  Gluconate Cloth  6 each Topical Q0600   darbepoetin (ARANESP ) injection - DIALYSIS  200 mcg Subcutaneous Q Tue-1800   feeding supplement  1 Container Oral TID BM   folic acid   1 mg Oral Daily   hydroxychloroquine   200 mg Oral Daily   levothyroxine   25 mcg Oral Q0600   midodrine  10 mg Oral TID WC   multivitamin  1 tablet Oral QHS   pantoprazole  (PROTONIX ) IV  40 mg Intravenous Q12H   polyethylene glycol  17 g Oral TID   predniSONE   20 mg Oral Daily   simethicone   240 mg Oral Once   simethicone   240 mg Oral Once   Continuous Infusions:  albumin  human     lactated ringers      potassium chloride      PRN Meds:.traMADol   DVT Prophylaxis  SCDs Start: 01/24/24 0753       Recent Labs  Lab 01/24/24 0535 01/24/24 2036 01/26/24 1635 01/27/24 0252 01/27/24 1538 01/28/24 0304 01/29/24 0453  WBC 5.6   < > 11.8* 11.7* 14.2* 14.3* 12.5*  HGB 5.3*   < > 10.7* 9.4* 8.5* 9.8* 9.7*  HCT 16.6*   < > 31.4* 28.0* 25.2* 28.6* 28.4*  PLT 154   < > 110* 100* 89* 75* 77*  MCV 98.8   < > 88.7 88.3 88.7 86.4 85.8  MCH 31.5   < > 30.2 29.7 29.9 29.6 29.3  MCHC 31.9   < > 34.1 33.6 33.7 34.3 34.2  RDW 20.7*   < > 17.6* 17.6* 17.3* 17.5* 17.4*  LYMPHSABS 0.9  --   --   --   --   --  0.3*  MONOABS 0.9  --   --   --   --   --  0.0*  EOSABS 0.2  --   --   --   --   --  0.0  BASOSABS 0.0  --   --   --   --   --  0.0   < > = values in this interval not displayed.    Recent Labs  Lab 01/24/24 0535 01/24/24 2036 01/25/24 0257 01/26/24 0341 01/27/24 0252 01/29/24 0453  NA 127*  --  127* 127* 131* 133*  K 5.7*  --  5.8* 4.4 4.3 3.1*  CL 96*  --  95* 96* 92* 94*  CO2 22  --  22 23 22 23   ANIONGAP 9  --  10 8 17* 16*  GLUCOSE 128*  --  91 89 108* 87  BUN 97*  --  100* 52* 63* 50*  CREATININE 6.19*  --  6.17* 4.07* 4.98* 4.38*  AST  --   --   --   --    --  17  ALT  --   --   --   --   --  9  ALKPHOS  --   --   --   --   --  83  BILITOT  --   --   --   --   --  1.1  ALBUMIN   --   --  <1.5* <1.5* <1.5* 1.5*  INR  --  1.0  --   --   --   --   MG  --   --   --   --   --  2.1  PHOS  --   --  4.6 4.2 4.5  --   CALCIUM  7.4*  --  7.2* 7.2* 7.4* 7.4*      Recent Labs  Lab 01/24/24 0535 01/24/24 2036 01/25/24 0257 01/26/24 0341 01/27/24 0252 01/29/24 0453  INR  --  1.0  --   --   --   --   MG  --   --   --   --   --  2.1  CALCIUM  7.4*  --  7.2* 7.2* 7.4* 7.4*       Signature  -   Lavada Stank M.D on 01/29/2024 at 9:50 AM   -  To page go to [email or web address]

## 2024-01-29 NOTE — Interval H&P Note (Signed)
 History and Physical Interval Note:  01/29/2024 8:46 AM  [name]  has presented today for surgery, with the diagnosis of Gastrointestinal bleeding, anemia.  The various methods of treatment have been discussed with the patient and family. After consideration of risks, benefits and other options for treatment, the patient has consented to  Procedure(s): COLONOSCOPY (N/A) EGD (ESOPHAGOGASTRODUODENOSCOPY) (N/A) BIOPSY, GI as a surgical intervention.  The patient's history has been reviewed, patient examined, no change in status, stable for surgery.  I have reviewed the patient's chart and labs.  Questions were answered to the patient's satisfaction.     Croy Drumwright D

## 2024-01-29 NOTE — Anesthesia Postprocedure Evaluation (Signed)
 Anesthesia Post Note  Patient: [name]  Procedure(s) Performed: COLONOSCOPY EGD (ESOPHAGOGASTRODUODENOSCOPY) BIOPSY, GI     Patient location during evaluation: PACU Anesthesia Type: MAC Level of consciousness: awake and alert Pain management: pain level controlled Vital Signs Assessment: post-procedure vital signs reviewed and stable Respiratory status: spontaneous breathing, nonlabored ventilation, respiratory function stable and patient connected to nasal cannula oxygen Cardiovascular status: stable and blood pressure returned to baseline Postop Assessment: no apparent nausea or vomiting Anesthetic complications: no   No notable events documented.  Last Vitals:  Vitals:   01/29/24 1352 01/29/24 1430  BP: 117/73 113/66  Pulse: (!) 101 89  Resp: (!) 29 15  Temp:    SpO2: 98% 99%    Last Pain:  Vitals:   01/29/24 1344  TempSrc:   PainSc: 1                  [name]

## 2024-01-29 NOTE — Procedures (Signed)
 Patient presented to IR today for possible paracentesis. Limited ultrasound evaluation of the abdomen revealed small, scattered pockets of fluid and large, dilated loops of bowel. No safe percutaneous window for paracentesis. No procedure performed. Images saved in Epic.   Warren Dais, AGACNP-BC 01/29/2024, 12:20 PM

## 2024-01-29 NOTE — Transfer of Care (Signed)
 Immediate Anesthesia Transfer of Care Note  Patient: [name]  Procedure(s) Performed: COLONOSCOPY EGD (ESOPHAGOGASTRODUODENOSCOPY) BIOPSY, GI  Patient Location: PACU  Anesthesia Type:MAC  Level of Consciousness: awake, alert , and oriented  Airway & Oxygen Therapy: Patient Spontanous Breathing and Patient connected to face mask oxygen  Post-op Assessment: Report given to RN and Post -op Vital signs reviewed and stable  Post vital signs: Reviewed and stable  Last Vitals:  Vitals Value Taken Time  BP 103/64 01/29/24 08:45  Temp    Pulse 75 01/29/24 08:48  Resp 19 01/29/24 08:48  SpO2 99 % 01/29/24 08:48  Vitals shown include unfiled device data.  Last Pain:  Vitals:   01/29/24 0737  TempSrc: Temporal  PainSc: 1          Complications: No notable events documented.

## 2024-01-29 NOTE — Plan of Care (Signed)
  Problem: Education: Goal: Knowledge of General Education information will improve Description: Including pain rating scale, medication(s)/side effects and non-pharmacologic comfort measures Outcome: Progressing   Problem: Clinical Measurements: Goal: Will remain free from infection Outcome: Progressing Goal: Diagnostic test results will improve Outcome: Progressing   Problem: Activity: Goal: Risk for activity intolerance will decrease Outcome: Progressing   Problem: Coping: Goal: Level of anxiety will decrease Outcome: Progressing   Problem: Elimination: Goal: Will not experience complications related to bowel motility Outcome: Progressing   Problem: Pain Managment: Goal: General experience of comfort will improve and/or be controlled Outcome: Progressing

## 2024-01-30 DIAGNOSIS — N186 End stage renal disease: Secondary | ICD-10-CM | POA: Diagnosis not present

## 2024-01-30 DIAGNOSIS — Z515 Encounter for palliative care: Secondary | ICD-10-CM | POA: Diagnosis not present

## 2024-01-30 DIAGNOSIS — M329 Systemic lupus erythematosus, unspecified: Secondary | ICD-10-CM

## 2024-01-30 DIAGNOSIS — K625 Hemorrhage of anus and rectum: Secondary | ICD-10-CM | POA: Diagnosis not present

## 2024-01-30 LAB — CBC WITH DIFFERENTIAL/PLATELET
Abs Immature Granulocytes: 0.2 K/uL — ABNORMAL HIGH (ref 0.00–0.07)
Basophils Absolute: 0 K/uL (ref 0.0–0.1)
Basophils Relative: 0 %
Eosinophils Absolute: 0 K/uL (ref 0.0–0.5)
Eosinophils Relative: 0 %
HCT: 27.6 % — ABNORMAL LOW (ref 39.0–52.0)
Hemoglobin: 9.4 g/dL — ABNORMAL LOW (ref 13.0–17.0)
Immature Granulocytes: 2 %
Lymphocytes Relative: 6 %
Lymphs Abs: 0.7 K/uL (ref 0.7–4.0)
MCH: 29.5 pg (ref 26.0–34.0)
MCHC: 34.1 g/dL (ref 30.0–36.0)
MCV: 86.5 fL (ref 80.0–100.0)
Monocytes Absolute: 0.4 K/uL (ref 0.1–1.0)
Monocytes Relative: 3 %
Neutro Abs: 10.8 K/uL — ABNORMAL HIGH (ref 1.7–7.7)
Neutrophils Relative %: 89 %
Platelets: 73 K/uL — ABNORMAL LOW (ref 150–400)
RBC: 3.19 MIL/uL — ABNORMAL LOW (ref 4.22–5.81)
RDW: 17.2 % — ABNORMAL HIGH (ref 11.5–15.5)
WBC: 12.2 K/uL — ABNORMAL HIGH (ref 4.0–10.5)
nRBC: 0 % (ref 0.0–0.2)

## 2024-01-30 LAB — MAGNESIUM: Magnesium: 1.9 mg/dL (ref 1.7–2.4)

## 2024-01-30 LAB — COMPREHENSIVE METABOLIC PANEL WITH GFR
ALT: 9 U/L (ref 0–44)
AST: 19 U/L (ref 15–41)
Albumin: 1.5 g/dL — ABNORMAL LOW (ref 3.5–5.0)
Alkaline Phosphatase: 84 U/L (ref 38–126)
Anion gap: 10 (ref 5–15)
BUN: 32 mg/dL — ABNORMAL HIGH (ref 6–20)
CO2: 26 mmol/L (ref 22–32)
Calcium: 7.5 mg/dL — ABNORMAL LOW (ref 8.9–10.3)
Chloride: 96 mmol/L — ABNORMAL LOW (ref 98–111)
Creatinine, Ser: 3.14 mg/dL — ABNORMAL HIGH (ref 0.61–1.24)
GFR, Estimated: 23 mL/min — ABNORMAL LOW (ref >60)
Glucose, Bld: 99 mg/dL (ref 70–99)
Potassium: 3.5 mmol/L (ref 3.5–5.1)
Sodium: 132 mmol/L — ABNORMAL LOW (ref 135–145)
Total Bilirubin: 0.9 mg/dL (ref 0.0–1.2)
Total Protein: 4.8 g/dL — ABNORMAL LOW (ref 6.5–8.1)

## 2024-01-30 NOTE — Progress Notes (Signed)
 PROGRESS NOTE        PATIENT DETAILS Name: [name] Age: 53 y.o. Sex: male Date of Birth: [birth year] Admit Date: 01/24/2024 Admitting Physician Marsha Ada, MD ERE:Xmzfzm, Elsie Sayre, MD  Brief Summary: Patient is a 53 y.o.  male with history of ESRD, ascites without formal diagnosis of liver cirrhosis, SBP (Klebsiella on peritoneal culture on 8/19), chronic HFrEF-presented with lower GI bleeding with acute blood loss anemia.  Significant events: 9/29>> admit to [facility]  Significant studies:  9/29>> CT abdomen/pelvis: Large loculated peritoneal fluid collection left abdomen/midline lower abdomen-tracks into right inguinal canal. 9/30>> CT angio GI bleed study: No extravasation.  Severe diverticulosis.  10/4.  Colonoscopy by Dr. Rollin.  Impression: A single (solitary) ulcer in the rectum. Biopsied. A few ulcers in the distal transverse colon. Biopsied. Congested mucosa in the entire examined colon. Diverticulosis in the sigmoid colon.  Recommendation: Return patient to hospital ward for ongoing care. Maintain laxatives to allow for healing of the stercoral ulcer.  10/4.  EGD by Dr. Rollin  Impression:  LA Grade D reflux esophagitis with no bleeding. Portal hypertensive gastropathy. Normal examined duodenum. No specimens collected.  Recommendation: If the patient continues to have anemia, with resolution of the rectal bleeding, octreotide 100 mcg IV then 25 mcg/hour to treat the bleeding. If tolerated he can be started on a beta blocker in the near future. Try to avoid lying down after eating or drinking. Maintain pantoprazole  40 mg BID. He can be converted to PO. If this vails, Voqueznea 20 mg QD, but this can be determined as an outpatient.  10/4.  Ultrasound-guided therapeutic and diagnostic paracentesis also requested by IR.  Significant microbiology data: None  Consults: Nephrology GI Palliative care  Subjective: Patient in bed denies any  headache or chest pain, no shortness of breath, feels a lot of gas in his abdomen.  Objective: Vitals: Blood pressure (!) 141/49, pulse 97, temperature 97.9 F (36.6 C), temperature source Oral, resp. rate 16, height 5' 11 (1.803 m), weight 66.9 kg, SpO2 98%.   Exam:  Cachectic middle-aged gentleman in hospital bed, awake Alert, No new F.N deficits,  [place].AT,PERRAL Supple Neck, No JVD,   Symmetrical Chest wall movement, Good air movement bilaterally, CTAB RRR,No Gallops, Rubs or new Murmurs,  +ve B.Sounds, Abd is distended, mild generalized tenderness No Cyanosis, Clubbing or edema    Assessment/Plan:  Lower GI bleeding with acute blood loss anemia likely coming from chronic constipation: stercoral ulcer Last episode of hematochezia on 10/2 afternoon, total of 5 units of packed RBC last transfusion on 01/27/2024.  Currently H&H stable, underwent EGD colonoscopy on 01/29/2024, colonoscopy suggestive of stercoral ulcer, likely from chronic constipation and rectal pressure.  EGD nonacute with some portal hypertensive gastropathy with minimal bleeding if any, on PPI for now.  Continue bowel regimen, continue to monitor CBC.  Discussed with GI Dr. Rollin on 01/29/2024  ESRD on HD TTS Nephrology following.  SLE Stable-continue prednisone /Plaquenil .  Chronic ascites-without formal diagnosis of cirrhosis complicated by SBP (Klebsiella 11/2023) CT reveals now a loculated effusion GI has offered workup for cirrhosis in the past-per prior documentation-both patient and family has refused. Had requested IR to evaluate for possible ultrasound-guided paracentesis on 01/30/2024, ultrasound showed minimal scattered pockets of fluid.  Chronic HFpEF Some mild lower extremity edema but otherwise stable Continue volume removal with HD  HTN BP  soft but stable Coreg  on hold-but on midodrine for BP support.  Hypothyroidism Continue Synthroid   Debility/deconditioning Likely some debility at  baseline-worse due to acute illness/GI bleeding, overall extremely weak and cachectic, poor baseline quality of life, discussed with patient's sister who is a Engineer, civil (consulting) and also POA she would like to talk to palliative care for long-term goals of care.  Palliative care was consulted on 01/29/2024 will likely talk to patient and family soon.  Code status:   Code Status: Full Code   DVT Prophylaxis: SCDs Start: 01/24/24 0753   Family Communication:Sister-Michell White-[phone]-updated 01/29/24, agrees with Pall care input, message left 01/30/2024 at 9:28 AM  Disposition Plan: Status is: Inpatient Remains inpatient appropriate because: Severity of illness   Planned Discharge Destination:Home   Diet: Diet Order             Diet 2 gram sodium Room service appropriate? Yes; Fluid consistency: Thin  Diet effective now                     Antimicrobial agents: Anti-infectives (From admission, onward)    Start     Dose/Rate Route Frequency Ordered Stop   01/24/24 1000  hydroxychloroquine  (PLAQUENIL ) tablet 200 mg        200 mg Oral Daily 01/24/24 0833         Data Review:   Patient Lines/Drains/Airways Status     Active Line/Drains/Airways     Name Placement date Placement time Site Days   Peripheral IV 01/24/24 22 G 1.75 Anterior;Proximal;Right Forearm 01/24/24  0733  Forearm  5   Peripheral IV 01/25/24 20 G 1.88 Right Antecubital 01/25/24  1840  Antecubital  4   Fistula / Graft Left Forearm Arteriovenous fistula 08/26/20  0807  Forearm  1251   Wound 01/27/24 2000 Other (Comment) Foot Anterior;Right 01/27/24  2000  Foot  2   Wound 01/27/24 2000 Other (Comment) Ischial tuberosity Left 01/27/24  2000  Ischial tuberosity  2   Wound 01/27/24 2000 Other (Comment) Foot Anterior;Left 01/27/24  2000  Foot  2   Wound 01/27/24 2000 Other (Comment) Ankle Anterior;Right 01/27/24  2000  Ankle  2   Wound 01/27/24 2000 01/27/24  2000  --  2   Wound 01/27/24 2000 Other (Comment) Arm  Left;Lower;Posterior;Proximal 01/27/24  2000  Arm  2             Inpatient Medications  Scheduled Meds:  sodium chloride    Intravenous Once   calcium  acetate  667 mg Oral TID AC   Chlorhexidine  Gluconate Cloth  6 each Topical Q0600   darbepoetin (ARANESP ) injection - DIALYSIS  200 mcg Subcutaneous Q Tue-1800   feeding supplement  1 Container Oral TID BM   folic acid   1 mg Oral Daily   hydroxychloroquine   200 mg Oral Daily   levothyroxine   25 mcg Oral Q0600   midodrine  10 mg Oral TID WC   multivitamin  1 tablet Oral QHS   pantoprazole   40 mg Oral BID   polyethylene glycol  17 g Oral TID   predniSONE   20 mg Oral Daily   simethicone   240 mg Oral Once   simethicone   240 mg Oral Once   Continuous Infusions:  albumin  human Stopped (01/29/24 1244)   PRN Meds:.traMADol   DVT Prophylaxis  SCDs Start: 01/24/24 0753       Recent Labs  Lab 01/24/24 0535 01/24/24 2036 01/27/24 0252 01/27/24 1538 01/28/24 0304 01/29/24 0453 01/30/24 0144  WBC 5.6   < >  11.7* 14.2* 14.3* 12.5* 12.2*  HGB 5.3*   < > 9.4* 8.5* 9.8* 9.7* 9.4*  HCT 16.6*   < > 28.0* 25.2* 28.6* 28.4* 27.6*  PLT 154   < > 100* 89* 75* 77* 73*  MCV 98.8   < > 88.3 88.7 86.4 85.8 86.5  MCH 31.5   < > 29.7 29.9 29.6 29.3 29.5  MCHC 31.9   < > 33.6 33.7 34.3 34.2 34.1  RDW 20.7*   < > 17.6* 17.3* 17.5* 17.4* 17.2*  LYMPHSABS 0.9  --   --   --   --  0.3* 0.7  MONOABS 0.9  --   --   --   --  0.0* 0.4  EOSABS 0.2  --   --   --   --  0.0 0.0  BASOSABS 0.0  --   --   --   --  0.0 0.0   < > = values in this interval not displayed.    Recent Labs  Lab 01/24/24 2036 01/25/24 0257 01/26/24 0341 01/27/24 0252 01/29/24 0453 01/30/24 0144  NA  --  127* 127* 131* 133* 132*  K  --  5.8* 4.4 4.3 3.1* 3.5  CL  --  95* 96* 92* 94* 96*  CO2  --  22 23 22 23 26   ANIONGAP  --  10 8 17* 16* 10  GLUCOSE  --  91 89 108* 87 99  BUN  --  100* 52* 63* 50* 32*  CREATININE  --  6.17* 4.07* 4.98* 4.38* 3.14*  AST  --    --   --   --  17 19  ALT  --   --   --   --  9 9  ALKPHOS  --   --   --   --  83 84  BILITOT  --   --   --   --  1.1 0.9  ALBUMIN   --  <1.5* <1.5* <1.5* 1.5* 1.5*  INR 1.0  --   --   --   --   --   MG  --   --   --   --  2.1 1.9  PHOS  --  4.6 4.2 4.5  --   --   CALCIUM   --  7.2* 7.2* 7.4* 7.4* 7.5*      Recent Labs  Lab 01/24/24 2036 01/25/24 0257 01/26/24 0341 01/27/24 0252 01/29/24 0453 01/30/24 0144  INR 1.0  --   --   --   --   --   MG  --   --   --   --  2.1 1.9  CALCIUM   --  7.2* 7.2* 7.4* 7.4* 7.5*       Signature  -   Lavada Stank M.D on 01/30/2024 at 9:29 AM   -  To page go to [email or web address]

## 2024-01-30 NOTE — Consult Note (Signed)
 Consultation Note Date: 01/30/2024   Patient Name: [name]  DOB: 1970/11/05  MRN: [id number]  Age / Sex: 53 y.o., male  PCP: Berneta Elsie Sayre, MD Referring Physician: Dennise Lavada POUR, MD  Reason for Consultation: Establishing goals of care  HPI/Patient Profile: 53 y.o. male   admitted on 01/24/2024 with   past medical history noteworthy for ESRD on HD, SLE, CHF with EF 55-60%, chronic ascites complicated by SBP (Klebsiella 11/2023), indeterminate workup for cirrhosis during admission August/Sept 2025 admitted to the hospital with acute onset bright blood blood per rectum and acute blood loss anemia (hgb 5.3).     He was recently admitted to the hospital 12/14/2023 - 01/03/2024 for worsening ascites and SBP culture positive for Klebsiella treated with ceftriaxone  and cefazolin .  Also had positive Fungitell -other tests for cryptococcus, Blastomyces, histoplasmosis and fungal culture negative.   Etiology of ascites has been presumed to be related to hypoalbuminemia.  Thought to have had a lupus flare and treated with prednisone  and Plaquenil .  Imaging during that admission was indeterminant for cirrhosis contributing to ascites -cirrhosis noted on CTAP 12/25/2023 and liver US /doppler 12/29/2023.   Imaging: CTAP -large, loculated organized peritoneal fluid collection mostly occupying the left abdomen and midline lower abdomen tracking into the inguinal canal; generalized edema/congestion stable compared to imaging last month; large bowel retained stool and extensive diverticulosis of the descending and sigmoid colon  Patient and his family face treatment option decisions, advanced directive decisions and anticipatory care needs.        Clinical Assessment and Goals of Care:  This NP Ronal Plants reviewed medical records, received report from team, assessed the patient and then meet at the patient's  bedside  to discuss diagnosis, prognosis, GOC, EOL wishes disposition and options.        I also spoke to his sister/HPOA/ [name]   Concept of Palliative Care was introduced as specialized medical care for people and their families living with serious illness.  If focuses on providing relief from the symptoms and stress of a serious illness.  The goal is to improve quality of life for both the patient and the family.  Values and goals of care important to patient and family were attempted to be elicited.  Created space and opportunity for patient  and family to explore thoughts and feelings regarding current medical situation.  Patient is adamant that he wishes to go home soon as possible, tomorrow,  he knows that he will do better at home he recognizes that he needs to improve his mobility and his oral intake.  Today he is not up to much conversation or questions.  Patient gives me permission to speak to his sister who is his H POA.   I was able to speak to [facility] by  telephone today.  Angel has a very clear understanding of the seriousness and complexity of her brothers medical situation.  She verbalizes some frustration in that at times medical providers do not have an in depth understanding  of  Lupus and how it presents in   other organ systems.  Patient's rheumatologist is Dr Aryal/Rhumatologist      A  discussion was had today regarding advanced directives.  Concepts specific to code status, artifical feeding and hydration, continued IV antibiotics and rehospitalization was had.  His sister is his documented H POA, no living will has been completed     MOST form introduced and discussed and the importance of securing next steps in treatment planning prior to disposition home.  Patient's sister plans to speak with Mr. Joan today regarding the logistics of his care once he is discharged home specific to the every day increased nursing care needs, and need for assistance in  order to continue outpatient dialysis.     Questions and concerns addressed.  Patient/family encouraged to call with questions or concerns.     PMT will continue to support holistically.           HPOA/ [employer] --- documented in [place]       SUMMARY OF RECOMMENDATIONS    Code Status/Advance Care Planning: Full code   Symptom Management:  Per attending  Palliative Prophylaxis:  Bowel Regimen, Delirium Protocol, and Frequent Pain Assessment  Additional Recommendations (Limitations, Scope, Preferences): Full Scope Treatment  Psycho-social/Spiritual:  Emotional support offered  Prognosis:  Unable to determine  Discharge Planning: To Be Determined      Primary Diagnoses: Present on Admission:  Bright red blood per rectum   I have reviewed the medical record, interviewed the patient and family, and examined the patient. The following aspects are pertinent.  Past Medical History:  Diagnosis Date   ESRD on hemodialysis (HCC)    TTS at [facility])   Lupus    Thyroid  disease    Social History   Socioeconomic History   Marital status: Single    Spouse name: Not on file   Number of children: Not on file   Years of education: Not on file   Highest education level: Not on file  Occupational History   Not on file  Tobacco Use   Smoking status: Never   Smokeless tobacco: Never  Vaping Use   Vaping status: Never Used  Substance and Sexual Activity   Alcohol use: Yes    Comment: 2-4 glasses of wine 2-3 times weekly   Drug use: No   Sexual activity: Not on file  Other Topics Concern   Not on file  Social History Narrative   Not on file   Social Drivers of Health   Financial Resource Strain: Not on file  Food Insecurity: No Food Insecurity (01/24/2024)   Hunger Vital Sign    Worried About Running Out of Food in the Last Year: Never true    Ran Out of Food in the Last Year: Never true  Transportation Needs: No Transportation  Needs (01/24/2024)   PRAPARE - Administrator, Civil Service (Medical): No    Lack of Transportation (Non-Medical): No  Physical Activity: Not on file  Stress: Not on file  Social Connections: Not on file   Family History  Problem Relation Age of Onset   Cancer Mother    Scheduled Meds:  sodium chloride    Intravenous Once   calcium  acetate  667 mg Oral TID AC   Chlorhexidine  Gluconate Cloth  6 each Topical Q0600   darbepoetin (ARANESP ) injection - DIALYSIS  200 mcg Subcutaneous Q Tue-1800   feeding supplement  1 Container Oral TID BM   folic  acid  1 mg Oral Daily   hydroxychloroquine   200 mg Oral Daily   levothyroxine   25 mcg Oral Q0600   midodrine  10 mg Oral TID WC   multivitamin  1 tablet Oral QHS   pantoprazole   40 mg Oral BID   polyethylene glycol  17 g Oral TID   predniSONE   20 mg Oral Daily   simethicone   240 mg Oral Once   simethicone   240 mg Oral Once   Continuous Infusions:  albumin  human Stopped (01/29/24 1244)   PRN Meds:.traMADol  Medications Prior to Admission:  Prior to Admission medications   Medication Sig Start Date End Date Taking? Authorizing Provider  amLODipine  (NORVASC ) 10 MG tablet Take 10 mg by mouth daily.   Yes [provider]  Calcium  Acetate 667 MG TABS Take 667 mg by mouth 3 (three) times daily before meals.   Yes [provider]  carvedilol  (COREG ) 25 MG tablet Take 1 tablet (25 mg total) by mouth 2 (two) times daily. 01/03/24  Yes Gonfa, Taye T, MD  hydroxychloroquine  (PLAQUENIL ) 200 MG tablet Take 1 tablet (200 mg total) by mouth daily. 01/03/24  Yes Gonfa, Taye T, MD  levothyroxine  (SYNTHROID ) 25 MCG tablet Take 1 tablet (25 mcg total) by mouth daily before breakfast. Patient taking differently: Take 25 mcg by mouth daily. 01/04/24  Yes Gonfa, Taye T, MD  Methoxy PEG-Epoetin Beta (MIRCERA IJ) Inject 225 mcg into the vein every 14 (fourteen) days. Administered at dialysis clinic   Yes [provider]   ondansetron  (ZOFRAN -ODT) 4 MG disintegrating tablet Take 1 tablet (4 mg total) by mouth every 8 (eight) hours as needed for nausea or vomiting. 01/03/24  Yes Gonfa, Taye T, MD  pantoprazole  (PROTONIX ) 40 MG tablet Take 1 tablet (40 mg total) by mouth daily. Patient taking differently: Take 40 mg by mouth daily as needed (heartburn). 01/04/24  Yes Gonfa, Taye T, MD  traMADol  (ULTRAM ) 50 MG tablet Take 25 mg by mouth daily as needed (pain).   Yes [provider]  folic acid  (FOLVITE ) 1 MG tablet Take 1 tablet (1 mg total) by mouth daily. Patient not taking: Reported on 01/24/2024 01/04/24   Gonfa, Taye T, MD  multivitamin (RENA-VIT) TABS tablet Take 1 tablet by mouth at bedtime. Patient not taking: Reported on 01/24/2024 01/03/24   Gonfa, Taye T, MD  polyethylene glycol powder (MIRALAX ) 17 GM/SCOOP powder Take 17 g by mouth 2 (two) times daily as needed for moderate constipation or mild constipation. Patient not taking: Reported on 01/24/2024 01/03/24   Gonfa, Taye T, MD  thiamine  (VITAMIN B-1) 100 MG tablet Take 1 tablet (100 mg total) by mouth daily. Patient not taking: Reported on 01/24/2024 01/04/24   Gonfa, Taye T, MD   No Known Allergies Review of Systems  Constitutional:  Positive for activity change, appetite change and fatigue.  Neurological:  Positive for weakness.    Physical Exam  Vital Signs: BP (!) 141/49 (BP Location: Right Leg)   Pulse 97   Temp 97.9 F (36.6 C) (Oral)   Resp 16   Ht 5' 11 (1.803 m)   Wt 66.9 kg   SpO2 98%   BMI 20.57 kg/m  Pain Scale: 0-10   Pain Score: 2    SpO2: SpO2: 98 % O2 Device:SpO2: 98 % O2 Flow Rate: .O2 Flow Rate (L/min): 2 L/min  IO: Intake/output summary:  Intake/Output Summary (Last 24 hours) at 01/30/2024 1036 Last data filed at 01/29/2024 1714 Gross per 24 hour  Intake --  Output 100 ml  Net -100 ml    LBM: Last BM Date : 01/30/24 Baseline Weight: Weight: 68.2 kg Most recent weight: Weight: 66.9 kg     Palliative  Assessment/Data:  50 % at best     Time:  75 minutes   Signed by: Ronal Plants, NP   Please contact Palliative Medicine Team phone at [phone] for questions and concerns.  For individual provider: See Tr[place]

## 2024-01-30 NOTE — Plan of Care (Signed)
  Problem: Clinical Measurements: Goal: Ability to maintain clinical measurements within normal limits will improve Outcome: Progressing Goal: Will remain free from infection Outcome: Progressing   Problem: Activity: Goal: Risk for activity intolerance will decrease Outcome: Progressing   Problem: Nutrition: Goal: Adequate nutrition will be maintained Outcome: Progressing   Problem: Elimination: Goal: Will not experience complications related to bowel motility Outcome: Progressing   Problem: Pain Managment: Goal: General experience of comfort will improve and/or be controlled Outcome: Progressing   Problem: Skin Integrity: Goal: Risk for impaired skin integrity will decrease Outcome: Progressing

## 2024-01-30 NOTE — Progress Notes (Signed)
 Subjective: No complaints.  No reports of hematochezia.  Objective: Vital signs in last 24 hours: Temp:  [97.4 F (36.3 C)-98.5 F (36.9 C)] 97.9 F (36.6 C) (10/05 0842) Pulse Rate:  [66-103] 97 (10/05 0842) Resp:  [12-29] 16 (10/05 0842) BP: (113-150)/(25-84) 141/49 (10/05 0842) SpO2:  [96 %-100 %] 98 % (10/05 0842) Weight:  [66.1 kg-66.9 kg] 66.9 kg (10/04 1714) Last BM Date : 01/30/24  Intake/Output from previous day: 10/04 0701 - 10/05 0700 In: 100 [I.V.:100] Out: 100  Intake/Output this shift: No intake/output data recorded.  General appearance: alert and no distress GI: soft, + ascites  Lab Results: Recent Labs    01/28/24 0304 01/29/24 0453 01/30/24 0144  WBC 14.3* 12.5* 12.2*  HGB 9.8* 9.7* 9.4*  HCT 28.6* 28.4* 27.6*  PLT 75* 77* 73*   BMET Recent Labs    01/29/24 0453 01/30/24 0144  NA 133* 132*  K 3.1* 3.5  CL 94* 96*  CO2 23 26  GLUCOSE 87 99  BUN 50* 32*  CREATININE 4.38* 3.14*  CALCIUM  7.4* 7.5*   LFT Recent Labs    01/30/24 0144  PROT 4.8*  ALBUMIN  1.5*  AST 19  ALT 9  ALKPHOS 84  BILITOT 0.9   PT/INR No results for input(s): LABPROT, INR in the last 72 hours. Hepatitis Panel No results for input(s): HEPBSAG, HCVAB, HEPAIGM, HEPBIGM in the last 72 hours. C-Diff No results for input(s): CDIFFTOX in the last 72 hours. Fecal Lactopherrin No results for input(s): FECLLACTOFRN in the last 72 hours.  Studies/Results: US  ASCITES (ABDOMEN LIMITED) Result Date: 01/29/2024 CLINICAL DATA:  Patient with a history of ESRD with recurrent ascites. Interventional Radiology asked to evaluate patient for a diagnostic and therapeutic paracentesis. EXAM: LIMITED ABDOMEN ULTRASOUND FOR ASCITES TECHNIQUE: Limited ultrasound survey for ascites was performed in all four abdominal quadrants. COMPARISON:  None Available. FINDINGS: Limited ultrasound evaluation of the abdomen revealed small, scattered pockets of fluid and large, dilated  loops of bowel. No safe percutaneous window for paracentesis. No procedure performed. IMPRESSION: Small volume ascites and large, dilated loops of bowel identified. Fluid volume insufficient to safely perform procedure. Ultrasound images reviewed by: Warren Dais, NP Electronically Signed   By: Cordella Banner   On: 01/29/2024 13:06    Medications: Scheduled:  sodium chloride    Intravenous Once   calcium  acetate  667 mg Oral TID AC   Chlorhexidine  Gluconate Cloth  6 each Topical Q0600   darbepoetin (ARANESP ) injection - DIALYSIS  200 mcg Subcutaneous Q Tue-1800   feeding supplement  1 Container Oral TID BM   folic acid   1 mg Oral Daily   hydroxychloroquine   200 mg Oral Daily   levothyroxine   25 mcg Oral Q0600   midodrine  10 mg Oral TID WC   multivitamin  1 tablet Oral QHS   pantoprazole   40 mg Oral BID   polyethylene glycol  17 g Oral TID   predniSONE   20 mg Oral Daily   simethicone   240 mg Oral Once   simethicone   240 mg Oral Once   Continuous:  albumin  human Stopped (01/29/24 1244)    Assessment/Plan: 1) Stercoral ulcer. 2) Left sided colonic ulceration - ? Ischemic. 3) Bleeding portal hypertensive gastropathy - mildly bleeding.   There is a mild downtrend in his HGB, but he does not report any hematochezia.  He knows to keep his stools soft.  This will resolve the stercoral ulcer.  Only time will determine how much of a role the  bleeding portal hypertensive gastropathy plays into his anemia.  Plan: 1) Maintain Miralax  17 g TID. 2) Monitor HGB and transfuse if necessary. 3) [facility] will resume care in the AM.  LOS: 6 days   Dollie Mayse D 01/30/2024, 10:17 AM

## 2024-01-30 NOTE — Progress Notes (Signed)
 [facility] Progress Note   Subjective:   Patient seen and examined at bedside.  Reports feeling a little better today.  Hoping to go home. Feels he will improve faster in his own home.    Objective Vitals:   01/29/24 1941 01/29/24 2318 01/30/24 0322 01/30/24 0842  BP: (!) 117/44 (!) 145/84 (!) 150/48 (!) 141/49  Pulse: 93 95 (!) 103 97  Resp: 16 19 19 16   Temp: 97.8 F (36.6 C) 97.8 F (36.6 C) 97.6 F (36.4 C) 97.9 F (36.6 C)  TempSrc: Oral Oral Oral Oral  SpO2: 96% 99% 98% 98%  Weight:      Height:       Physical Exam General:chronically ill appearing male in NAD Heart:RRR Lungs:nml WOB on RA Abdomen:distended Extremities:1+ LE edema Dialysis Access: AVF   [facility] Weights   01/27/24 1939 01/29/24 1344 01/29/24 1714  Weight: 66.9 kg 66.1 kg 66.9 kg    Intake/Output Summary (Last 24 hours) at 01/30/2024 1006 Last data filed at 01/29/2024 1714 Gross per 24 hour  Intake --  Output 100 ml  Net -100 ml    Additional Objective Labs: Basic Metabolic Panel: Recent Labs  Lab 01/25/24 0257 01/26/24 0341 01/27/24 0252 01/29/24 0453 01/30/24 0144  NA 127* 127* 131* 133* 132*  K 5.8* 4.4 4.3 3.1* 3.5  CL 95* 96* 92* 94* 96*  CO2 22 23 22 23 26   GLUCOSE 91 89 108* 87 99  BUN 100* 52* 63* 50* 32*  CREATININE 6.17* 4.07* 4.98* 4.38* 3.14*  CALCIUM  7.2* 7.2* 7.4* 7.4* 7.5*  PHOS 4.6 4.2 4.5  --   --    Liver Function Tests: Recent Labs  Lab 01/27/24 0252 01/29/24 0453 01/30/24 0144  AST  --  17 19  ALT  --  9 9  ALKPHOS  --  83 84  BILITOT  --  1.1 0.9  PROT  --  4.7* 4.8*  ALBUMIN  <1.5* 1.5* 1.5*   CBC: Recent Labs  Lab 01/24/24 0535 01/24/24 2036 01/27/24 0252 01/27/24 1538 01/28/24 0304 01/29/24 0453 01/30/24 0144  WBC 5.6   < > 11.7* 14.2* 14.3* 12.5* 12.2*  NEUTROABS 3.7  --   --   --   --  12.3* 10.8*  HGB 5.3*   < > 9.4* 8.5* 9.8* 9.7* 9.4*  HCT 16.6*   < > 28.0* 25.2* 28.6* 28.4* 27.6*  MCV 98.8   < > 88.3 88.7 86.4 85.8  86.5  PLT 154   < > 100* 89* 75* 77* 73*   < > = values in this interval not displayed.    Studies/Results: US  ASCITES (ABDOMEN LIMITED) Result Date: 01/29/2024 CLINICAL DATA:  Patient with a history of ESRD with recurrent ascites. Interventional Radiology asked to evaluate patient for a diagnostic and therapeutic paracentesis. EXAM: LIMITED ABDOMEN ULTRASOUND FOR ASCITES TECHNIQUE: Limited ultrasound survey for ascites was performed in all four abdominal quadrants. COMPARISON:  None Available. FINDINGS: Limited ultrasound evaluation of the abdomen revealed small, scattered pockets of fluid and large, dilated loops of bowel. No safe percutaneous window for paracentesis. No procedure performed. IMPRESSION: Small volume ascites and large, dilated loops of bowel identified. Fluid volume insufficient to safely perform procedure. Ultrasound images reviewed by: Warren Dais, NP Electronically Signed   By: Cordella Banner   On: 01/29/2024 13:06    Medications:  albumin  human Stopped (01/29/24 1244)    sodium chloride    Intravenous Once   calcium  acetate  667 mg Oral TID AC  Chlorhexidine  Gluconate Cloth  6 each Topical Q0600   darbepoetin (ARANESP ) injection - DIALYSIS  200 mcg Subcutaneous Q Tue-1800   feeding supplement  1 Container Oral TID BM   folic acid   1 mg Oral Daily   hydroxychloroquine   200 mg Oral Daily   levothyroxine   25 mcg Oral Q0600   midodrine  10 mg Oral TID WC   multivitamin  1 tablet Oral QHS   pantoprazole   40 mg Oral BID   polyethylene glycol  17 g Oral TID   predniSONE   20 mg Oral Daily   simethicone   240 mg Oral Once   simethicone   240 mg Oral Once    Dialysis Orders: TTS - AF 3:45hr, 400/A1.5, EDW 58kg, 2K/2Ca bath, AVF, no heparin  - Last HD 9/25, left at 65.7kg - up on fluids - Mircera 225mcg IV q 2 weeks - last given 9/16. Last Hgb 8.6 on 9/25 - Hectoral on hold   Assessment/Plan: BRBPR, ?hemorrhoidal bleeding: Hgb down on admit, s/p 6 u prbcs so far.   GI following - EGD this AM with LA grade D reflux esophagitis w/o bleeding, portal hypertensive gastropathy w/spontaneous oozing and friability. No varices. Colonoscopy with single ulcer in rectum - biopsied, and few ulcers in distal transverse colon & congested mucosa throughout. Medical management per GI.   ESRD:  Usual TTS schedule. Next HD 10/7.  Hypotension/volume: BP soft. Does have some chronic edema. UF as tolerated. On midodrine 10 mg TID  Acute on chronic Anemia: 2/2 #1. S/p 6 units pRBC. Received Aranesp  200 on 9/30. Continue weekly. Hgb 9.4. Metabolic bone disease: CorrCa ok/Phos ok for now. Nutrition - renal diet w/fluid restrictions.  SLE: On Plaquenil  and steroids. Recurrent ascites c/b recent peritonitis.CT with loculated fluid going into R scrotum. paracentesis ordered - Small volume ascites and large, dilated loops of bowel identified. Fluid volume insufficient to safely perform procedure.  Manuelita Labella, PA-C [facility] 01/30/2024,10:06 AM  LOS: 6 days

## 2024-01-31 ENCOUNTER — Encounter (HOSPITAL_COMMUNITY): Payer: Self-pay | Admitting: Gastroenterology

## 2024-01-31 ENCOUNTER — Other Ambulatory Visit (HOSPITAL_COMMUNITY): Payer: Self-pay

## 2024-01-31 DIAGNOSIS — M329 Systemic lupus erythematosus, unspecified: Secondary | ICD-10-CM | POA: Diagnosis not present

## 2024-01-31 DIAGNOSIS — Z515 Encounter for palliative care: Secondary | ICD-10-CM | POA: Diagnosis not present

## 2024-01-31 DIAGNOSIS — K625 Hemorrhage of anus and rectum: Secondary | ICD-10-CM | POA: Diagnosis not present

## 2024-01-31 DIAGNOSIS — N186 End stage renal disease: Secondary | ICD-10-CM | POA: Diagnosis not present

## 2024-01-31 DIAGNOSIS — Z87898 Personal history of other specified conditions: Secondary | ICD-10-CM | POA: Diagnosis not present

## 2024-01-31 LAB — CBC WITH DIFFERENTIAL/PLATELET
Abs Immature Granulocytes: 0.34 K/uL — ABNORMAL HIGH (ref 0.00–0.07)
Basophils Absolute: 0 K/uL (ref 0.0–0.1)
Basophils Relative: 0 %
Eosinophils Absolute: 0 K/uL (ref 0.0–0.5)
Eosinophils Relative: 0 %
HCT: 28.8 % — ABNORMAL LOW (ref 39.0–52.0)
Hemoglobin: 9.5 g/dL — ABNORMAL LOW (ref 13.0–17.0)
Immature Granulocytes: 3 %
Lymphocytes Relative: 7 %
Lymphs Abs: 0.9 K/uL (ref 0.7–4.0)
MCH: 29.1 pg (ref 26.0–34.0)
MCHC: 33 g/dL (ref 30.0–36.0)
MCV: 88.3 fL (ref 80.0–100.0)
Monocytes Absolute: 0.4 K/uL (ref 0.1–1.0)
Monocytes Relative: 3 %
Neutro Abs: 11.9 K/uL — ABNORMAL HIGH (ref 1.7–7.7)
Neutrophils Relative %: 87 %
Platelets: 62 K/uL — ABNORMAL LOW (ref 150–400)
RBC: 3.26 MIL/uL — ABNORMAL LOW (ref 4.22–5.81)
RDW: 17.1 % — ABNORMAL HIGH (ref 11.5–15.5)
WBC: 13.6 K/uL — ABNORMAL HIGH (ref 4.0–10.5)
nRBC: 0 % (ref 0.0–0.2)

## 2024-01-31 LAB — COMPREHENSIVE METABOLIC PANEL WITH GFR
ALT: 8 U/L (ref 0–44)
AST: 19 U/L (ref 15–41)
Albumin: <1.5 g/dL — ABNORMAL LOW (ref 3.5–5.0)
Alkaline Phosphatase: 78 U/L (ref 38–126)
Anion gap: 13 (ref 5–15)
BUN: 44 mg/dL — ABNORMAL HIGH (ref 6–20)
CO2: 24 mmol/L (ref 22–32)
Calcium: 7.5 mg/dL — ABNORMAL LOW (ref 8.9–10.3)
Chloride: 95 mmol/L — ABNORMAL LOW (ref 98–111)
Creatinine, Ser: 3.92 mg/dL — ABNORMAL HIGH (ref 0.61–1.24)
GFR, Estimated: 17 mL/min — ABNORMAL LOW (ref >60)
Glucose, Bld: 88 mg/dL (ref 70–99)
Potassium: 3.7 mmol/L (ref 3.5–5.1)
Sodium: 132 mmol/L — ABNORMAL LOW (ref 135–145)
Total Bilirubin: 1 mg/dL (ref 0.0–1.2)
Total Protein: 4.7 g/dL — ABNORMAL LOW (ref 6.5–8.1)

## 2024-01-31 LAB — MAGNESIUM: Magnesium: 1.9 mg/dL (ref 1.7–2.4)

## 2024-01-31 MED ORDER — PREDNISONE 10 MG PO TABS
20.0000 mg | ORAL_TABLET | Freq: Every day | ORAL | 0 refills | Status: DC
  Filled 2024-01-31: qty 60, 30d supply, fill #0

## 2024-01-31 MED ORDER — CARVEDILOL 6.25 MG PO TABS
6.2500 mg | ORAL_TABLET | Freq: Two times a day (BID) | ORAL | 0 refills | Status: DC
  Filled 2024-01-31: qty 60, 30d supply, fill #0

## 2024-01-31 MED ORDER — AMLODIPINE BESYLATE 10 MG PO TABS: 10.0000 mg | ORAL_TABLET | Freq: Every day | ORAL | Status: DC

## 2024-01-31 MED ORDER — CARVEDILOL 6.25 MG PO TABS
6.2500 mg | ORAL_TABLET | Freq: Two times a day (BID) | ORAL | Status: DC
  Administered 2024-01-31: 6.25 mg via ORAL
  Filled 2024-01-31: qty 1

## 2024-01-31 MED ORDER — PANTOPRAZOLE SODIUM 40 MG PO TBEC
40.0000 mg | DELAYED_RELEASE_TABLET | Freq: Two times a day (BID) | ORAL | 0 refills | Status: DC
  Filled 2024-01-31: qty 60, 30d supply, fill #0

## 2024-01-31 MED ORDER — FOLIC ACID 1 MG PO TABS
1.0000 mg | ORAL_TABLET | Freq: Every day | ORAL | 0 refills | Status: DC
  Filled 2024-01-31: qty 30, 30d supply, fill #0

## 2024-01-31 MED ORDER — CIPROFLOXACIN HCL 500 MG PO TABS: 500.0000 mg | ORAL_TABLET | Freq: Every day | ORAL | 0 refills | Status: DC

## 2024-01-31 MED ORDER — THIAMINE HCL 100 MG PO TABS
100.0000 mg | ORAL_TABLET | Freq: Every day | ORAL | 0 refills | Status: DC
  Filled 2024-01-31: qty 30, 30d supply, fill #0

## 2024-01-31 MED ORDER — CIPROFLOXACIN HCL 500 MG PO TABS
250.0000 mg | ORAL_TABLET | Freq: Every day | ORAL | 0 refills | Status: DC
Start: 2024-01-31 — End: 2024-03-01

## 2024-01-31 MED ORDER — POLYETHYLENE GLYCOL 3350 17 GM/SCOOP PO POWD
17.0000 g | Freq: Two times a day (BID) | ORAL | 0 refills | Status: DC | PRN
  Filled 2024-01-31: qty 238, 7d supply, fill #0

## 2024-01-31 NOTE — TOC Initial Note (Signed)
 Transition of Care [facility]) - Initial/Assessment Note    Patient Details  Name: [name] MRN: [id number] Date of Birth: [birth year]  [facility] CM/SW Contact:    Stephane Powell Jansky, RN Phone Number: 01/31/2024, 8:28 AM  Clinical Narrative:                  Spoke to patient at bedside.   Discussed PT recommendation for short term rehab  at a SNF. Patient voiced understanding. Patient declined rehab at a SNF. Patient wants to go home instead. Patient reports he has 5 friends who either don't work or work from home and can assist him, also his sister and cousin can assist him.   NCM asked if he wanted NCM to call sister to discuss plan , he declined. Patient states she is aware. His friends will provide transportation home today.   [facility] has accepted for home health. Patient aware home health will not be in the home daily and home health only stays about a hour. PAtient voiced understanding  OT recommending a bedside commode/ 3 in 1 . Explained same to patient. NCM will ask for an order for same. Patient states he will purchase one on his own    Expected Discharge Plan: Home w Home Health Services Barriers to Discharge: No Barriers Identified   Patient Goals and CMS Choice Patient states their goals for this hospitalization and ongoing recovery are:: to return to home CMS Medicare.gov Compare Post Acute Care list provided to:: Patient Choice offered to / list presented to : Patient      Expected Discharge Plan and Services In-house Referral: Clinical Social Work Discharge Planning Services: CM Consult Post Acute Care Choice: Home Health, Durable Medical Equipment Living arrangements for the past 2 months: Single Family Home Expected Discharge Date: 01/31/24               DME Arranged:  (patient declined)         HH Arranged: PT, RN, Social Work [employer] Agency: [facility] Date HH Agency Contacted: 01/31/24 Time HH Agency Contacted:  [phone] Representative spoke with at [facility] Agency: [place]  Prior Living Arrangements/Services Living arrangements for the past 2 months: Single Family Home Lives with:: Self Patient language and need for interpreter reviewed:: Yes Do you feel safe going back to the place where you live?: Yes      Need for Family Participation in Patient Care: Yes (Comment) Care giver support system in place?: Yes (comment) Current home services: DME Criminal Activity/Legal Involvement Pertinent to Current Situation/Hospitalization: No - Comment as needed  Activities of Daily Living   ADL Screening (condition at time of admission) Is the patient deaf or have difficulty hearing?: No Does the patient have difficulty seeing, even when wearing glasses/contacts?: No Does the patient have difficulty concentrating, remembering, or making decisions?: No  Permission Sought/Granted   Permission granted to share information with : Yes, Verbal Permission Granted     Permission granted to share info w AGENCY: [facility]        Emotional Assessment Appearance:: Appears stated age Attitude/Demeanor/Rapport: Engaged Affect (typically observed): Appropriate Orientation: : Oriented to Self, Oriented to Place, Oriented to  Time, Oriented to Situation Alcohol / Substance Use: Not Applicable Psych Involvement: No (comment)  Admission diagnosis:  Hyperkalemia [E87.5] Hyponatremia [E87.1] GIB (gastrointestinal bleeding) [K92.2] Anemia associated with acute blood loss [D62] End-stage renal disease on hemodialysis (HCC) [N18.6, Z99.2] Hypotension due to hypovolemia [E86.1] Patient Active Problem List   Diagnosis  Date Noted   Hypotension due to hypovolemia 01/25/2024   Anemia associated with acute blood loss 01/25/2024   End-stage renal disease on hemodialysis (HCC) 01/25/2024   Bright red blood per rectum 01/24/2024   History of ascites 01/24/2024   ABLA (acute blood loss anemia) 01/24/2024   Constipation 01/24/2024    Cirrhosis of liver with ascites (HCC) 12/31/2023   Spontaneous bacterial peritonitis (HCC) 12/14/2023   Hyperkalemia 12/14/2023   Pericardial effusion 12/01/2021   Ascites 10/01/2020   Disorder of kidney and ureter, unspecified 10/01/2020   Essential hypertension 10/01/2020   Inflammatory arthritis 10/01/2020   Lupus nephritis (HCC) 10/01/2020   Thrombocytopenia 10/01/2020   ESRD on dialysis (HCC) 07/03/2020   Weight loss 07/03/2020   Malnutrition of moderate degree 05/01/2020   Acquired hypothyroidism 11/27/2019   Cardiac murmur 11/24/2019   Chronic systolic CHF (congestive heart failure) (HCC) 11/06/2019   Pancytopenia (HCC) 02/06/2019   Alcohol use 02/06/2019   Elevated LFTs 02/06/2019   Vitamin D  deficiency 12/05/2018   False positive interferon-gamma release assay (IGRA) for tuberculosis 12/05/2018   Neurotic excoriations 11/01/2018   Polyarthralgia 06/11/2015   Systemic lupus erythematosus with lung involvement (HCC) 03/09/2015   PCP:  Berneta Elsie Sayre, MD Pharmacy:   [facility] [address] [place] - [address] [place] [address] Phone: [phone] Fax: [phone]  [facility] [address] [place] [address] Phone: [phone] Fax: [phone]     Social Drivers of Health (SDOH) Social History: SDOH Screenings   Food Insecurity: No Food Insecurity (01/24/2024)  Housing: Unknown (01/24/2024)  Transportation Needs: No Transportation Needs (01/24/2024)  Utilities: Not At Risk (01/24/2024)  Depression (PHQ2-9): Low Risk  (01/02/2022)  Tobacco Use: Low Risk  (01/29/2024)   SDOH Interventions:     Readmission Risk Interventions    01/03/2024   11:13 AM  Readmission Risk Prevention Plan  Transportation Screening Complete  HRI or Home Care Consult Complete  Social Work Consult for Recovery Care Planning/Counseling Complete  Palliative Care Screening Not Applicable   Medication Review Oceanographer) Referral to Pharmacy

## 2024-01-31 NOTE — Progress Notes (Signed)
 D/C order noted. Contacted [facility] to be advised of pt's d/c today and that pt should resume care tomorrow.   Randine Mungo Dialysis Navigator [phone]

## 2024-01-31 NOTE — Discharge Summary (Addendum)
 [name] FMW:[id number] DOB: 1970-10-20 DOA: 01/24/2024  PCP: Berneta Elsie Sayre, MD  Admit date: 01/24/2024  Discharge date: 01/31/2024  Admitted From: Home   disposition: Home, refused SNF   Recommendations for Outpatient Follow-up:   Follow up with PCP in 1-2 weeks  PCP Please obtain BMP/CBC, 2 view CXR in 1week,  (see Discharge instructions)   PCP Please follow up on the following pending results: Monitor home medications including prednisone  dose, if she declines further please consider palliative care/hospice   Home Health: PT, RN, social work if he qualifies Equipment/Devices: None Consultations: GI, palliative care Discharge Condition: Guarded CODE STATUS: Full    Diet Recommendation: Renal diet with 1.5 L fluid restriction per day    Chief Complaint  Patient presents with   Rectal Bleeding     Hemorrhoids     Brief history of present illness from the day of admission and additional interim summary    53 y.o.  male with history of ESRD, ascites without formal diagnosis of liver cirrhosis, SBP (Klebsiella on peritoneal culture on 8/19), chronic HFrEF-presented with lower GI bleeding with acute blood loss anemia.   Significant events: 9/29>> admit to [facility]   Significant studies:   9/29>> CT abdomen/pelvis: Large loculated peritoneal fluid collection left abdomen/midline lower abdomen-tracks into right inguinal canal. 9/30>> CT angio GI bleed study: No extravasation.  Severe diverticulosis.   10/4.  Colonoscopy by Dr. Rollin.   Impression: A single (solitary) ulcer in the rectum. Biopsied. A few ulcers in the distal transverse colon. Biopsied. Congested mucosa in the entire examined colon. Diverticulosis in the sigmoid colon.   Recommendation: Return patient to hospital ward for ongoing  care. Maintain laxatives to allow for healing of the stercoral ulcer.   10/4.  EGD by Dr. Rollin   Impression:  LA Grade D reflux esophagitis with no bleeding. Portal hypertensive gastropathy. Normal examined duodenum. No specimens collected.   Recommendation: If the patient continues to have anemia, with resolution of the rectal bleeding, octreotide 100 mcg IV then 25 mcg/hour to treat the bleeding. If tolerated he can be started on a beta blocker in the near future. Try to avoid lying down after eating or drinking. Maintain pantoprazole  40 mg BID. He can be converted to PO. If this vails, Voqueznea 20 mg QD, but this can be determined as an outpatient.                                                                   Hospital Course    Lower GI bleeding with acute blood loss anemia likely coming from chronic constipation: stercoral ulcer.  With ascites of unclear cause could be anasarca from ESRD or from portal hypertension Last episode of hematochezia on 10/2 afternoon, total of 5 units of packed RBC last  transfusion on 01/27/2024.  Currently H&H stable, underwent EGD colonoscopy on 01/29/2024, colonoscopy suggestive of stercoral ulcer, likely from chronic constipation and rectal pressure.  EGD nonacute with some portal hypertensive gastropathy with minimal bleeding if any, on PPI for now.  Continue bowel regimen, stable CBC, postdischarge follow-up with [facility] within a week.   Chronic ascites-without formal diagnosis of cirrhosis complicated by SBP (Klebsiella 11/2023) CT reveals now a loculated effusion GI has offered workup for cirrhosis in the past-per prior documentation-both patient and family has refused. Repeat ultrasound on 01/29/2024 shows scattered pockets of fluid but no single significant tappable fluid collection. Finished treatment for SBP here, Once a day Cipro 250mg  daily given for 30 days for prophylaxis, defer long-term follow-up and management per GI, sister updated 01/31/24  1.30 pm, DW Dr Rollin GI as well.    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  Underwent HD per schedule.   SLE Stable-continue prednisone /Plaquenil .  PCP to monitor prednisone  dose.  Chronic HFpEF Compensated fluid removal via HD, continue Coreg     HTN Blood pressure was soft now improved on moderate dose Coreg .  PCP to monitor and adjust   Hypothyroidism Continue Synthroid    Debility/deconditioning severe PCM  Patient and family were initially leaning on palliative care and hospice, however patient now has decided to pursue aggressive care and full code and wants to be discharged home, sister is POA and a nurse who completely understands that patient's medical condition is extremely poor however she respects his decision at this time.  She completely understands that he likely will do poorly and will be readmitted to the hospital very soon, remains open to palliative care and hospice if further decline.   Discharge diagnosis     Principal Problem:   Bright red blood per rectum Active Problems:   History of ascites   ABLA (acute blood loss anemia)   Constipation   Hypotension due to hypovolemia   Anemia associated with acute blood loss   End-stage renal disease on hemodialysis [facility])    Discharge instructions    Discharge Instructions     Discharge instructions   Complete by: As directed    Follow with Primary MD Berneta Elsie Sayre, MD in 7 days   Get CBC, CMP, Magnesium , 2 view Chest X ray, home medications and specially your prednisone  dose-  checked next visit with your primary MD     Activity: As tolerated with Full fall precautions use walker/cane & assistance as needed  Disposition Home    Diet: Renal diet 1.5 L fluid restriction per day  Special Instructions: If you have smoked or chewed Tobacco  in the last 2 yrs please stop smoking, stop any regular Alcohol  and or any Recreational drug use.  On your next visit with your primary  care physician please Get Medicines reviewed and adjusted.  Please request your Prim.MD to go over all Hospital Tests and Procedure/Radiological results at the follow up, please get all Hospital records sent to your Prim MD by signing hospital release before you go home.  If you experience worsening of your admission symptoms, develop shortness of breath, life threatening emergency, suicidal or homicidal thoughts you must seek medical attention immediately by calling 911 or calling your MD immediately  if symptoms less severe.  You Must read complete instructions/literature along with all the possible adverse reactions/side effects for all the Medicines you take and that have been prescribed to you. Take any new Medicines after you have completely  understood and accpet all the possible adverse reactions/side effects.   Do not drive when taking Pain medications.  Do not take more than prescribed Pain, Sleep and Anxiety Medications  Wear Seat belts while driving.   Increase activity slowly   Complete by: As directed    No wound care   Complete by: As directed        Discharge Medications   Allergies as of 01/31/2024   No Known Allergies      Medication List     STOP taking these medications    amLODipine  10 MG tablet Commonly known as: NORVASC    thiamine  100 MG tablet Commonly known as: Vitamin B-1 Replaced by: thiamine  100 MG tablet       TAKE these medications    Calcium  Acetate 667 MG Tabs Take 667 mg by mouth 3 (three) times daily before meals.   carvedilol  6.25 MG tablet Commonly known as: COREG  Take 1 tablet (6.25 mg total) by mouth 2 (two) times daily with a meal. What changed:  medication strength how much to take when to take this   ciprofloxacin 500 MG tablet Commonly known as: CIPRO Take 0.5 tablets (250 mg total) by mouth daily with breakfast.   folic acid  1 MG tablet Commonly known as: FOLVITE  Take 1 tablet (1 mg total) by mouth daily.    hydroxychloroquine  200 MG tablet Commonly known as: PLAQUENIL  Take 1 tablet (200 mg total) by mouth daily.   levothyroxine  25 MCG tablet Commonly known as: SYNTHROID  Take 1 tablet (25 mcg total) by mouth daily before breakfast. What changed: when to take this   MIRCERA IJ Inject 225 mcg into the vein every 14 (fourteen) days. Administered at dialysis clinic   multivitamin Tabs tablet Take 1 tablet by mouth at bedtime.   ondansetron  4 MG disintegrating tablet Commonly known as: ZOFRAN -ODT Take 1 tablet (4 mg total) by mouth every 8 (eight) hours as needed for nausea or vomiting.   pantoprazole  40 MG tablet Commonly known as: PROTONIX  Take 1 tablet (40 mg total) by mouth 2 (two) times daily. What changed: when to take this   polyethylene glycol powder 17 GM/SCOOP powder Commonly known as: MiraLax  Take 17 grams mixed in liquid by mouth 2 (two) times daily as needed for moderate constipation or mild constipation.   predniSONE  10 MG tablet Commonly known as: DELTASONE  Take 2 tablets (20 mg total) by mouth daily.   thiamine  100 MG tablet Commonly known as: VITAMIN B1 Take 1 tablet (100 mg total) by mouth daily. Replaces: thiamine  100 MG tablet   traMADol  50 MG tablet Commonly known as: ULTRAM  Take 25 mg by mouth daily as needed (pain).         Follow-up Information     Berneta Elsie Sayre, MD. Schedule an appointment as soon as possible for a visit in 1 week(s).   Specialty: Family Medicine Contact information: [address] [place] [address] [phone]         [facility]. Schedule an appointment as soon as possible for a visit in 1 week(s).   Specialty: Gastroenterology Contact information: [address] [place]  [address] [phone]        [facility].   Specialty: Home Health Services Contact information: [address] [place] [address] [place]  [address] [phone]                 Major procedures and Radiology Reports - PLEASE review detailed  and final reports thoroughly  -       US  ASCITES (ABDOMEN LIMITED) Result Date: 01/29/2024 CLINICAL DATA:  Patient with a history of ESRD with recurrent ascites. Interventional Radiology asked to evaluate patient for a diagnostic and therapeutic paracentesis. EXAM: LIMITED ABDOMEN ULTRASOUND FOR ASCITES TECHNIQUE: Limited ultrasound survey for ascites was performed in all four abdominal quadrants. COMPARISON:  None Available. FINDINGS: Limited ultrasound evaluation of the abdomen revealed small, scattered pockets of fluid and large, dilated loops of bowel. No safe percutaneous window for paracentesis. No procedure performed. IMPRESSION: Small volume ascites and large, dilated loops of bowel identified. Fluid volume insufficient to safely perform procedure. Ultrasound images reviewed by: Warren Dais, NP Electronically Signed   By: Cordella Banner   On: 01/29/2024 13:06   CT ANGIO GI BLEED Result Date: 01/25/2024 EXAM: CTA ABDOMEN AND PELVIS WITH CONTRAST 01/25/2024 07:35:00 PM TECHNIQUE: CTA images of the abdomen and pelvis with and without intravenous contrast. Three-dimensional MIP/volume rendered formations were performed. Automated exposure control, iterative reconstruction, and/or weight based adjustment of the mA/kV was utilized to reduce the radiation dose to as low as reasonably achievable. COMPARISON: CT abdomen and pelvis 02/29/2025. CLINICAL HISTORY: Aneurysm, renal or visceral; lower GI bleed-please assess for active extravasation. CT ANGIO GI BLEED; Aneurysm, renal or visceral, lower GI bleed-please assess for active extravasation. Omni 350 70mL. FINDINGS: VASCULATURE: GI BLEED: No evidence of active GI bleeding. AORTA: No acute finding. No abdominal aortic aneurysm. No dissection. CELIAC TRUNK: No acute finding. No occlusion or significant stenosis. SUPERIOR MESENTERIC ARTERY: No  acute finding. No occlusion or significant stenosis. INFERIOR MESENTERIC ARTERY: No acute finding. No occlusion or significant stenosis. RENAL ARTERIES: No acute finding. No occlusion or significant stenosis. ILIAC ARTERIES: No acute finding. No occlusion or significant stenosis. ABDOMEN/PELVIS: LOWER CHEST: Visualized portion of the lower chest demonstrates no acute abnormality. LIVER: Cirrhotic liver morphology. Unchanged small subcapsular fluid collection along the posterior right hepatic lobe; there is again a tenuous connection with the large peritoneal fluid collection described below. GALLBLADDER AND BILE DUCTS: Gallbladder is unremarkable. No biliary ductal dilatation. SPLEEN: The spleen is unremarkable. PANCREAS: The pancreas is unremarkable. ADRENAL GLANDS: Bilateral adrenal glands demonstrate no acute abnormality. KIDNEYS, URETERS AND BLADDER: Bilateral cortical renal atrophy. Chronic nonspecific perinephric stranding. Bladder wall thickening. No stones in the kidneys or ureters. No hydronephrosis. GI AND BOWEL: Mild diffuse gastric wall thickening. Large colonic stool burden. Additional wall thickening of the proximal jejunum near the ligament of Treitz. Wall thickening of the rectum with fat stranding and edema. Extensive colonic diverticulosis. There is no bowel obstruction. No evidence of active bleeding. REPRODUCTIVE: Scrotal edema. PERITONEUM AND RETROPERITONEUM: Thick-walled organized collection extending from the inferior surface of the liver into the low anterior abdomen and right inguinal canal/scrotum is unchanged. Similar mass effect on the adjacent small bowel and mesentery. Mesenteric and omental edema. Similar free fluid in the abdomen and pelvis. No free intraperitoneal air. LYMPH NODES: Similar shotty retroperitoneal lymph nodes. BONES AND SOFT TISSUES: Avascular necrosis of both femoral heads. Body wall anasarca and scrotal edema. No acute abnormality of the bones. No acute soft tissue  abnormality. IMPRESSION: 1. No active gastrointestinal bleeding. 2. Wall thickening of the stomach, jejunum near the ligament of treitz, and rectum are favored infectious/inflammatory though portal congestive Gastropathy/enteropathy/colopathy could appear similarly. 3. Extensive colonic diverticulosis with large colonic stool burden, without evidence of diverticulitis. 4. Bladder wall thickening and nonspecific perinephric stranding. Correlate for cystitis/pyelonephritis. Electronically signed by: Norman Gatlin MD 01/25/2024 08:11 PM  EDT RP Workstation: [id number]   CT ABDOMEN PELVIS WO CONTRAST Result Date: 01/24/2024 CLINICAL DATA:  53 year old male with left lower quadrant abdominal pain. History of lupus, end-stage renal disease, bacterial peritonitis, recurrent ascites. Cirrhosis suspected by imaging recently. EXAM: CT ABDOMEN AND PELVIS WITHOUT CONTRAST TECHNIQUE: Multidetector CT imaging of the abdomen and pelvis was performed following the standard protocol without IV contrast. RADIATION DOSE REDUCTION: This exam was performed according to the departmental dose-optimization program which includes automated exposure control, adjustment of the mA and/or kV according to patient size and/or use of iterative reconstruction technique. COMPARISON:  CT Abdomen and Pelvis with contrast 12/25/2023 and earlier. FINDINGS: Lower chest: Cardiomegaly. No pericardial effusion. No pleural effusion. Negative lung bases. Hepatobiliary: The gallbladder is more distended now. No convincing acute pericholecystic inflammation. Noncontrast liver appears stable, with a small subcapsular fluid collection along the posterior right hepatic lobe (series 3, image 30) stable to slightly smaller. And that collection appears to have a tenuous communication with a larger peritoneal fluid collection which is described below (series 3, image 44). Pancreas: Negative noncontrast appearance. Spleen: Stable spleen size, upper limits of normal.  Punctate calcified splenic granulomas. Adrenals/Urinary Tract: Negative adrenal glands. Moderate bilateral renal atrophy. Nonobstructed kidneys. Chronic pararenal space edema/stranding. Diminutive but thick-walled urinary bladder. Stomach/Bowel: Organized, loculated appearing peritoneal fluid collection now occupying the left abdomen, tracking across midline at the pelvic inlet, and continuing into the right inguinal canal. The largest component of this collection encompasses 122 x 157 x 93 mm (AP by transverse by CC) for an estimated intra-abdominal volume of at least 890 mL. Regional mass effect on bowel and mesentery. No gas within the collection. Background generalized mesenteric and omental edema similar to the August CT. No abnormally dilated bowel loops. Large bowel retained stool and also fairly extensive diverticulosis of the descending and sigmoid colon. Generalized mild bowel wall thickening has not significantly changed from last month. Air in fluid in the stomach. Mostly diminutive duodenum. Vascular/Lymphatic: Normal caliber abdominal aorta. Blood pool hypodensity suggesting anemia. Relatively mild Aortoiliac calcified atherosclerosis. Small retroperitoneal lymph nodes appear stable and may be End stage renal disease related. No enlarged or progressive lymph nodes. Reproductive: Loculated peritoneal fluid collection with fairly developed rind of soft tissue tracks into the right inguinal canal, with estimated fluid volume in the canal of 200 mL (series 8, image 7), similar to mildly increased from last month. Superimposed generalized scrotal edema. Other: Presacral stranding, not significantly changed. No layering free fluid in the pelvis. Musculoskeletal: No acute osseous abnormality identified. IMPRESSION: 1. Large, loculated/organized peritoneal fluid collection mostly occupying the left abdomen and midline lower abdomen (volume there at least 890 mL) and tracks into the right inguinal canal, with  inguinal/scrotal fluid volume estimated at 200 mL. Also tenuous communication of this collection also with a small right inferior hepatic lobe subcapsular fluid collection which has not progressed from last month. 2. Generalized omental, mesenteric edema or congestion similar to last month. Mild generalized bowel wall thickening, stable, without acute bowel obstruction. 3. Renal atrophy.  Cardiomegaly.  Lung bases are clear. 4.  Aortic Atherosclerosis (ICD10-I70.0). Electronically Signed   By: VEAR Hurst M.D.   On: 01/24/2024 06:35   IR ABDOMEN US  LIMITED Result Date: 01/03/2024 CLINICAL DATA:  53 year old male with a history of lupus, ESRD, on HD, with recurrent ascites. IR was requested for therapeutic paracentesis. EXAM: ULTRASOUND ABDOMEN LIMITED COMPARISON:  IR paracentesis on 12/24/2023. FINDINGS: Limited ultrasound done of all 4 quadrants. There is only small volume  of ascites present. There is no pocket of fluid large enough to allow for safe approach for paracentesis. IMPRESSION: Small volume ascites without pocket of fluid large enough to allow for safe approach for paracentesis. Ultrasound performed by: Carlin Griffon, PA-C Electronically Signed   By: CHRISTELLA.  Shick M.D.   On: 01/03/2024 11:23    Micro Results     No results found for this or any previous visit (from the past 240 hours).  Today   Subjective    [name] today has no headache,no chest abdominal pain,no new weakness tingling or numbness, feels much better wants to go home today.     Objective   Blood pressure (!) 125/53, pulse 84, temperature (!) 97.5 F (36.4 C), temperature source Oral, resp. rate 17, height 5' 11 (1.803 m), weight 66.9 kg, SpO2 96%.  No intake or output data in the 24 hours ending 01/31/24 1326  Exam  Frail severely cachectic male lying in hospital bed, awake Alert, No new F.N deficits, adamant on going home, refuses to consider SNF or palliative care at this point [place].AT,PERRAL Supple Neck,    Symmetrical Chest wall movement, Good air movement bilaterally, CTAB RRR,No Gallops,   +ve B.Sounds, Abd mildly distended but soft No Cyanosis, Clubbing or edema    Data Review   Recent Labs  Lab 01/27/24 1538 01/28/24 0304 01/29/24 0453 01/30/24 0144 01/31/24 0338  WBC 14.2* 14.3* 12.5* 12.2* 13.6*  HGB 8.5* 9.8* 9.7* 9.4* 9.5*  HCT 25.2* 28.6* 28.4* 27.6* 28.8*  PLT 89* 75* 77* 73* 62*  MCV 88.7 86.4 85.8 86.5 88.3  MCH 29.9 29.6 29.3 29.5 29.1  MCHC 33.7 34.3 34.2 34.1 33.0  RDW 17.3* 17.5* 17.4* 17.2* 17.1*  LYMPHSABS  --   --  0.3* 0.7 0.9  MONOABS  --   --  0.0* 0.4 0.4  EOSABS  --   --  0.0 0.0 0.0  BASOSABS  --   --  0.0 0.0 0.0    Recent Labs  Lab 01/24/24 2036 01/25/24 0257 01/25/24 0257 01/26/24 0341 01/27/24 0252 01/29/24 0453 01/30/24 0144 01/31/24 0338  NA  --  127*   < > 127* 131* 133* 132* 132*  K  --  5.8*   < > 4.4 4.3 3.1* 3.5 3.7  CL  --  95*   < > 96* 92* 94* 96* 95*  CO2  --  22   < > 23 22 23 26 24   ANIONGAP  --  10   < > 8 17* 16* 10 13  GLUCOSE  --  91   < > 89 108* 87 99 88  BUN  --  100*   < > 52* 63* 50* 32* 44*  CREATININE  --  6.17*   < > 4.07* 4.98* 4.38* 3.14* 3.92*  AST  --   --   --   --   --  17 19 19   ALT  --   --   --   --   --  9 9 8   ALKPHOS  --   --   --   --   --  83 84 78  BILITOT  --   --   --   --   --  1.1 0.9 1.0  ALBUMIN   --  <1.5*   < > <1.5* <1.5* 1.5* 1.5* <1.5*  INR 1.0  --   --   --   --   --   --   --  MG  --   --   --   --   --  2.1 1.9 1.9  PHOS  --  4.6  --  4.2 4.5  --   --   --   CALCIUM   --  7.2*   < > 7.2* 7.4* 7.4* 7.5* 7.5*   < > = values in this interval not displayed.    Total Time in preparing paper work, data evaluation and todays exam - 35 minutes  Signature  -    Lavada Stank M.D on 01/31/2024 at 1:26 PM   -  To page go to [email or web address]

## 2024-01-31 NOTE — Discharge Planning (Signed)
 [facility] Patient Discharge Orders- [facility] CLINIC: AF   Patient's name: [name] Admit/DC Dates: 01/24/2024 - 01/31/2024  Discharge Diagnoses: LGIB/Hematochezia. S/p EGD w portal hypertensive gastropathy; colonoscopy w stercoral ulcer  ABLA. Transfused 5 u pRBCs Chronic ascites   Outpatient Dialysis Orders:  -Heparin : None -EDW: No change  -[place]: No change  Anemia Aranesp : Given: Y   Date of last dose/amount: 9/30 200 mcg   PRBC's Given: Y Date/# of units: 9/29 5 U  ESA dose for discharge: Mircera 225 mcg IV q 2 weeks   Recent Labs  Lab 01/27/24 0252 01/27/24 1538 01/31/24 0338  HGB 9.4*   < > 9.5*  K 4.3   < > 3.7  CALCIUM  7.4*   < > 7.5*  PHOS 4.5  --   --   ALBUMIN  <1.5*   < > <1.5*   < > = values in this interval not displayed.   Access intervention/Change: none   Medications: -IV Antibiotics: none  -Other anticoagulation: none  OTHER/APPTS/LABS  CODE STATUS Remains full code. Poor functional status    Completed by: Maisie Ronnald Acosta PA-C   D/C Meds to be reconciled by nurse after every discharge.    Reviewed by: MD:______ RN_______

## 2024-01-31 NOTE — Progress Notes (Signed)
 [facility] Progress Note   Subjective:    Seen in room. Feeling better this am.  Abd pain improved. Denies further bleeding Plans for discharge today   Objective Vitals:   01/31/24 0037 01/31/24 0330 01/31/24 0830 01/31/24 0938  BP: (!) 166/67 (!) 120/41 (!) 116/28 (!) 125/53  Pulse: 88 78 84   Resp: 18 13 18 17   Temp: 97.7 F (36.5 C) (!) 97.4 F (36.3 C) (!) 97.5 F (36.4 C)   TempSrc: Oral Oral Oral   SpO2: 96%     Weight:      Height:       Physical Exam General: chronically ill, nad  Heart: RRR Resp: nml WOB on RA Abdomen:distended Extremities: 1+ LE edema Dialysis Access: AVF   Filed Weights   01/27/24 1939 01/29/24 1344 01/29/24 1714  Weight: 66.9 kg 66.1 kg 66.9 kg   No intake or output data in the 24 hours ending 01/31/24 1018   Additional Objective Labs: Basic Metabolic Panel: Recent Labs  Lab 01/25/24 0257 01/26/24 0341 01/27/24 0252 01/29/24 0453 01/30/24 0144 01/31/24 0338  NA 127* 127* 131* 133* 132* 132*  K 5.8* 4.4 4.3 3.1* 3.5 3.7  CL 95* 96* 92* 94* 96* 95*  CO2 22 23 22 23 26 24   GLUCOSE 91 89 108* 87 99 88  BUN 100* 52* 63* 50* 32* 44*  CREATININE 6.17* 4.07* 4.98* 4.38* 3.14* 3.92*  CALCIUM  7.2* 7.2* 7.4* 7.4* 7.5* 7.5*  PHOS 4.6 4.2 4.5  --   --   --    Liver Function Tests: Recent Labs  Lab 01/29/24 0453 01/30/24 0144 01/31/24 0338  AST 17 19 19   ALT 9 9 8   ALKPHOS 83 84 78  BILITOT 1.1 0.9 1.0  PROT 4.7* 4.8* 4.7*  ALBUMIN  1.5* 1.5* <1.5*   CBC: Recent Labs  Lab 01/27/24 1538 01/28/24 0304 01/29/24 0453 01/30/24 0144 01/31/24 0338  WBC 14.2* 14.3* 12.5* 12.2* 13.6*  NEUTROABS  --   --  12.3* 10.8* 11.9*  HGB 8.5* 9.8* 9.7* 9.4* 9.5*  HCT 25.2* 28.6* 28.4* 27.6* 28.8*  MCV 88.7 86.4 85.8 86.5 88.3  PLT 89* 75* 77* 73* 62*    Studies/Results: US  ASCITES (ABDOMEN LIMITED) Result Date: 01/29/2024 CLINICAL DATA:  Patient with a history of ESRD with recurrent ascites. Interventional Radiology  asked to evaluate patient for a diagnostic and therapeutic paracentesis. EXAM: LIMITED ABDOMEN ULTRASOUND FOR ASCITES TECHNIQUE: Limited ultrasound survey for ascites was performed in all four abdominal quadrants. COMPARISON:  None Available. FINDINGS: Limited ultrasound evaluation of the abdomen revealed small, scattered pockets of fluid and large, dilated loops of bowel. No safe percutaneous window for paracentesis. No procedure performed. IMPRESSION: Small volume ascites and large, dilated loops of bowel identified. Fluid volume insufficient to safely perform procedure. Ultrasound images reviewed by: Warren Dais, NP Electronically Signed   By: Cordella Banner   On: 01/29/2024 13:06    Medications:  albumin  human Stopped (01/29/24 1244)    sodium chloride    Intravenous Once   calcium  acetate  667 mg Oral TID AC   carvedilol   6.25 mg Oral BID WC   Chlorhexidine  Gluconate Cloth  6 each Topical Q0600   darbepoetin (ARANESP ) injection - DIALYSIS  200 mcg Subcutaneous Q Tue-1800   feeding supplement  1 Container Oral TID BM   folic acid   1 mg Oral Daily   hydroxychloroquine   200 mg Oral Daily   levothyroxine   25 mcg Oral Q0600   multivitamin  1  tablet Oral QHS   pantoprazole   40 mg Oral BID   polyethylene glycol  17 g Oral TID   predniSONE   20 mg Oral Daily   simethicone   240 mg Oral Once   simethicone   240 mg Oral Once    Dialysis Orders: TTS - AF 3:45hr, 400/A1.5, EDW 58kg, 2K/2Ca bath, AVF, no heparin  - Last HD 9/25, left at 65.7kg - up on fluids - Mircera 225mcg IV q 2 weeks - last given 9/16. Last Hgb 8.6 on 9/25 - Hectoral on hold   Assessment/Plan: BRBPR, ?hemorrhoidal bleeding: Hgb down on admit, s/p 6 u prbcs so far.  GI following - EGD this AM with LA grade D reflux esophagitis w/o bleeding, portal hypertensive gastropathy w/spontaneous oozing and friability. No varices. Colonoscopy with single ulcer in rectum - biopsied, and few ulcers in distal transverse colon &  congested mucosa throughout. Medical management per GI.   ESRD:  Usual TTS schedule. Next HD 10/7.  Hypotension/volume: BP soft. Does have some chronic edema. UF as tolerated. On midodrine 10 mg TID  Acute on chronic Anemia: 2/2 #1. S/p 6 units pRBC. Received Aranesp  200 on 9/30. Continue weekly. Hgb 9.4. Metabolic bone disease: CorrCa ok/Phos ok for now. Nutrition - renal diet w/fluid restrictions.  SLE: On Plaquenil  and steroids. Recurrent ascites c/b recent peritonitis.  Maisie Ronnald Acosta PA-C [facility] 01/31/2024,10:18 AM

## 2024-01-31 NOTE — Progress Notes (Signed)
 Patient ID: [name], male   DOB: Dec 16, 1970, 53 y.o.   MRN: [id number]    Progress Note from the Palliative Medicine Team at [facility]   Patient Name: [name]        Date: 01/31/2024 DOB: 06/21/1970  Age: 53 y.o. MRN#: [id number] Attending Physician: No att. providers found Primary Care Physician: Berneta Elsie Sayre, MD Admit Date: 01/24/2024   Reason for Consultation/Follow-up   Establishing Goals of Care   HPI/ Brief Hospital Review  53 y.o. male   admitted on 01/24/2024 with   past medical history noteworthy for ESRD on HD, SLE, CHF with EF 55-60%, chronic ascites complicated by SBP (Klebsiella 11/2023), indeterminate workup for cirrhosis during admission August/Sept 2025 admitted to the hospital with acute onset bright blood blood per rectum and acute blood loss anemia (hgb 5.3).     He was recently admitted to the hospital 12/14/2023 - 01/03/2024 for worsening ascites and SBP culture positive for Klebsiella treated with ceftriaxone  and cefazolin .  Also had positive Fungitell -other tests for cryptococcus, Blastomyces, histoplasmosis and fungal culture negative.   Etiology of ascites has been presumed to be related to hypoalbuminemia.  Thought to have had a lupus flare and treated with prednisone  and Plaquenil .  Imaging during that admission was indeterminant for cirrhosis contributing to ascites -cirrhosis noted on CTAP 12/25/2023 and liver US /doppler 12/29/2023.    Imaging: CTAP -large, loculated organized peritoneal fluid collection mostly occupying the left abdomen and midline lower abdomen tracking into the inguinal canal; generalized edema/congestion stable compared to imaging last month; large bowel retained stool and extensive diverticulosis of the descending and sigmoid colon   Patient and his family face treatment option decisions, advanced directive decisions and anticipatory care needs.     Subjective  Extensive chart review has been completed prior to  meeting with patient/family  including labs, vital signs, imaging, progress/consult notes, orders, medications and available advance directive documents.    This NP assessed patient at the bedside as a follow up to  yesterday's GOCs meeting.  Patient anxiously      Education offered today regarding  the importance of continued conversation with family and their  medical providers regarding overall plan of care and treatment options,  ensuring decisions are within the context of the patients values and GOCs.  Questions and concerns addressed   Discussed with primary team and nursing staff   Time: 25  minutes  Detailed review of medical records ( labs, imaging, vital signs), medically appropriate exam ( MS, skin, cardiac,  resp)   discussed with treatment team, counseling and education to patient, family, staff, documenting clinical information, medication management, coordination of care    Ronal Plants NP  Palliative Medicine Team Team Phone # [phone] Pager [phone]

## 2024-01-31 NOTE — Plan of Care (Signed)
  Problem: Health Behavior/Discharge Planning: Goal: Ability to manage health-related needs will improve Outcome: Progressing   Problem: Clinical Measurements: Goal: Ability to maintain clinical measurements within normal limits will improve Outcome: Progressing Goal: Will remain free from infection Outcome: Progressing   Problem: Activity: Goal: Risk for activity intolerance will decrease Outcome: Progressing   Problem: Coping: Goal: Level of anxiety will decrease Outcome: Progressing   Problem: Elimination: Goal: Will not experience complications related to bowel motility Outcome: Progressing

## 2024-01-31 NOTE — Discharge Instructions (Addendum)
 Follow with Primary MD Berneta Elsie Sayre, MD in 7 days   Get CBC, CMP, Magnesium , 2 view Chest X ray, home medications and specially your prednisone  dose-  checked next visit with your primary MD    Activity: As tolerated with Full fall precautions use walker/cane & assistance as needed  Disposition Home    Diet: Renal diet 1.5 L fluid restriction per day  Special Instructions: If you have smoked or chewed Tobacco  in the last 2 yrs please stop smoking, stop any regular Alcohol  and or any Recreational drug use.  On your next visit with your primary care physician please Get Medicines reviewed and adjusted.  Please request your Prim.MD to go over all Hospital Tests and Procedure/Radiological results at the follow up, please get all Hospital records sent to your Prim MD by signing hospital release before you go home.  If you experience worsening of your admission symptoms, develop shortness of breath, life threatening emergency, suicidal or homicidal thoughts you must seek medical attention immediately by calling 911 or calling your MD immediately  if symptoms less severe.  You Must read complete instructions/literature along with all the possible adverse reactions/side effects for all the Medicines you take and that have been prescribed to you. Take any new Medicines after you have completely understood and accpet all the possible adverse reactions/side effects.   Do not drive when taking Pain medications.  Do not take more than prescribed Pain, Sleep and Anxiety Medications  Wear Seat belts while driving.

## 2024-02-01 ENCOUNTER — Emergency Department (HOSPITAL_COMMUNITY)

## 2024-02-01 ENCOUNTER — Encounter (HOSPITAL_COMMUNITY): Payer: Self-pay

## 2024-02-01 ENCOUNTER — Other Ambulatory Visit: Payer: Self-pay

## 2024-02-01 ENCOUNTER — Inpatient Hospital Stay (HOSPITAL_COMMUNITY)
Admission: EM | Admit: 2024-02-01 | Discharge: 2024-02-04 | DRG: 432 | Disposition: A | Discharge status: Hospice | Attending: Family Medicine | Admitting: Family Medicine

## 2024-02-01 DIAGNOSIS — E039 Hypothyroidism, unspecified: Secondary | ICD-10-CM | POA: Diagnosis not present

## 2024-02-01 DIAGNOSIS — D6959 Other secondary thrombocytopenia: Secondary | ICD-10-CM | POA: Diagnosis present

## 2024-02-01 DIAGNOSIS — K766 Portal hypertension: Secondary | ICD-10-CM | POA: Diagnosis present

## 2024-02-01 DIAGNOSIS — Z515 Encounter for palliative care: Secondary | ICD-10-CM | POA: Diagnosis not present

## 2024-02-01 DIAGNOSIS — D631 Anemia in chronic kidney disease: Secondary | ICD-10-CM | POA: Diagnosis not present

## 2024-02-01 DIAGNOSIS — Z992 Dependence on renal dialysis: Secondary | ICD-10-CM

## 2024-02-01 DIAGNOSIS — N2581 Secondary hyperparathyroidism of renal origin: Secondary | ICD-10-CM | POA: Diagnosis not present

## 2024-02-01 DIAGNOSIS — E210 Primary hyperparathyroidism: Secondary | ICD-10-CM | POA: Diagnosis not present

## 2024-02-01 DIAGNOSIS — M3214 Glomerular disease in systemic lupus erythematosus: Secondary | ICD-10-CM | POA: Diagnosis not present

## 2024-02-01 DIAGNOSIS — R6510 Systemic inflammatory response syndrome (SIRS) of non-infectious origin without acute organ dysfunction: Secondary | ICD-10-CM

## 2024-02-01 DIAGNOSIS — L89322 Pressure ulcer of left buttock, stage 2: Secondary | ICD-10-CM | POA: Diagnosis not present

## 2024-02-01 DIAGNOSIS — N186 End stage renal disease: Secondary | ICD-10-CM | POA: Diagnosis present

## 2024-02-01 DIAGNOSIS — I5032 Chronic diastolic (congestive) heart failure: Secondary | ICD-10-CM | POA: Diagnosis present

## 2024-02-01 DIAGNOSIS — R188 Other ascites: Secondary | ICD-10-CM | POA: Diagnosis not present

## 2024-02-01 DIAGNOSIS — Z66 Do not resuscitate: Secondary | ICD-10-CM | POA: Diagnosis not present

## 2024-02-01 DIAGNOSIS — S3011XA Contusion of abdominal wall, initial encounter: Secondary | ICD-10-CM | POA: Diagnosis present

## 2024-02-01 DIAGNOSIS — I132 Hypertensive heart and chronic kidney disease with heart failure and with stage 5 chronic kidney disease, or end stage renal disease: Secondary | ICD-10-CM | POA: Diagnosis present

## 2024-02-01 DIAGNOSIS — Z79890 Hormone replacement therapy (postmenopausal): Secondary | ICD-10-CM

## 2024-02-01 DIAGNOSIS — R531 Weakness: Secondary | ICD-10-CM | POA: Diagnosis not present

## 2024-02-01 DIAGNOSIS — K922 Gastrointestinal hemorrhage, unspecified: Secondary | ICD-10-CM | POA: Diagnosis not present

## 2024-02-01 DIAGNOSIS — K7460 Unspecified cirrhosis of liver: Secondary | ICD-10-CM | POA: Diagnosis not present

## 2024-02-01 DIAGNOSIS — L89156 Pressure-induced deep tissue damage of sacral region: Secondary | ICD-10-CM | POA: Diagnosis not present

## 2024-02-01 DIAGNOSIS — K219 Gastro-esophageal reflux disease without esophagitis: Secondary | ICD-10-CM | POA: Diagnosis not present

## 2024-02-01 DIAGNOSIS — Z7401 Bed confinement status: Secondary | ICD-10-CM

## 2024-02-01 DIAGNOSIS — U071 COVID-19: Secondary | ICD-10-CM | POA: Diagnosis not present

## 2024-02-01 DIAGNOSIS — K7469 Other cirrhosis of liver: Secondary | ICD-10-CM | POA: Diagnosis not present

## 2024-02-01 DIAGNOSIS — I10 Essential (primary) hypertension: Secondary | ICD-10-CM | POA: Diagnosis not present

## 2024-02-01 DIAGNOSIS — I120 Hypertensive chronic kidney disease with stage 5 chronic kidney disease or end stage renal disease: Secondary | ICD-10-CM | POA: Diagnosis not present

## 2024-02-01 DIAGNOSIS — I5030 Unspecified diastolic (congestive) heart failure: Secondary | ICD-10-CM | POA: Diagnosis not present

## 2024-02-01 DIAGNOSIS — R000 Tachycardia, unspecified: Secondary | ICD-10-CM | POA: Diagnosis not present

## 2024-02-01 LAB — I-STAT VENOUS BLOOD GAS, ED
Acid-Base Excess: 2 mmol/L (ref 0.0–2.0)
Bicarbonate: 26.7 mmol/L (ref 20.0–28.0)
Calcium, Ion: 1.02 mmol/L — ABNORMAL LOW (ref 1.15–1.40)
HCT: 33 % — ABNORMAL LOW (ref 39.0–52.0)
Hemoglobin: 11.2 g/dL — ABNORMAL LOW (ref 13.0–17.0)
O2 Saturation: 73 %
Potassium: 3.9 mmol/L (ref 3.5–5.1)
Sodium: 130 mmol/L — ABNORMAL LOW (ref 135–145)
TCO2: 28 mmol/L (ref 22–32)
pCO2, Ven: 40.8 mmHg — ABNORMAL LOW (ref 44–60)
pH, Ven: 7.424 (ref 7.25–7.43)
pO2, Ven: 37 mmHg (ref 32–45)

## 2024-02-01 LAB — CBC
HCT: 32.6 % — ABNORMAL LOW (ref 39.0–52.0)
Hemoglobin: 10.5 g/dL — ABNORMAL LOW (ref 13.0–17.0)
MCH: 28.9 pg (ref 26.0–34.0)
MCHC: 32.2 g/dL (ref 30.0–36.0)
MCV: 89.8 fL (ref 80.0–100.0)
Platelets: 71 K/uL — ABNORMAL LOW (ref 150–400)
RBC: 3.63 MIL/uL — ABNORMAL LOW (ref 4.22–5.81)
RDW: 16.8 % — ABNORMAL HIGH (ref 11.5–15.5)
WBC: 17.9 K/uL — ABNORMAL HIGH (ref 4.0–10.5)
nRBC: 0 % (ref 0.0–0.2)

## 2024-02-01 LAB — RESP PANEL BY RT-PCR (RSV, FLU A&B, COVID)  RVPGX2
Influenza A by PCR: NEGATIVE
Influenza B by PCR: NEGATIVE
Resp Syncytial Virus by PCR: NEGATIVE
SARS Coronavirus 2 by RT PCR: POSITIVE — AB

## 2024-02-01 LAB — CBG MONITORING, ED: Glucose-Capillary: 82 mg/dL (ref 70–99)

## 2024-02-01 LAB — COMPREHENSIVE METABOLIC PANEL WITH GFR
ALT: 10 U/L (ref 0–44)
AST: 30 U/L (ref 15–41)
Albumin: 1.7 g/dL — ABNORMAL LOW (ref 3.5–5.0)
Alkaline Phosphatase: 85 U/L (ref 38–126)
Anion gap: 15 (ref 5–15)
BUN: 60 mg/dL — ABNORMAL HIGH (ref 6–20)
CO2: 23 mmol/L (ref 22–32)
Calcium: 7.7 mg/dL — ABNORMAL LOW (ref 8.9–10.3)
Chloride: 91 mmol/L — ABNORMAL LOW (ref 98–111)
Creatinine, Ser: 4.88 mg/dL — ABNORMAL HIGH (ref 0.61–1.24)
GFR, Estimated: 13 mL/min — ABNORMAL LOW (ref >60)
Glucose, Bld: 87 mg/dL (ref 70–99)
Potassium: 4 mmol/L (ref 3.5–5.1)
Sodium: 129 mmol/L — ABNORMAL LOW (ref 135–145)
Total Bilirubin: 0.8 mg/dL (ref 0.0–1.2)
Total Protein: 5.3 g/dL — ABNORMAL LOW (ref 6.5–8.1)

## 2024-02-01 LAB — SURGICAL PATHOLOGY

## 2024-02-01 LAB — CK: Total CK: 33 U/L — ABNORMAL LOW (ref 49–397)

## 2024-02-01 LAB — MAGNESIUM: Magnesium: 2.1 mg/dL (ref 1.7–2.4)

## 2024-02-01 LAB — BRAIN NATRIURETIC PEPTIDE: B Natriuretic Peptide: 610.3 pg/mL — ABNORMAL HIGH (ref 0.0–100.0)

## 2024-02-01 MED ORDER — ACETAMINOPHEN 325 MG PO TABS: 650.0000 mg | ORAL_TABLET | Freq: Four times a day (QID) | ORAL | Status: DC | PRN

## 2024-02-01 MED ORDER — CHLORHEXIDINE GLUCONATE CLOTH 2 % EX PADS
6.0000 | MEDICATED_PAD | Freq: Every day | CUTANEOUS | Status: DC
Start: 2024-02-02 — End: 2024-02-04

## 2024-02-01 MED ORDER — KETOROLAC TROMETHAMINE 15 MG/ML IJ SOLN
15.0000 mg | Freq: Once | INTRAMUSCULAR | Status: AC
  Administered 2024-02-01: 15 mg via INTRAVENOUS
  Filled 2024-02-01: qty 1

## 2024-02-01 MED ORDER — VANCOMYCIN HCL IN DEXTROSE 1-5 GM/200ML-% IV SOLN
1000.0000 mg | Freq: Once | INTRAVENOUS | Status: AC
  Administered 2024-02-01: 1000 mg via INTRAVENOUS
  Filled 2024-02-01: qty 200

## 2024-02-01 MED ORDER — LEVOTHYROXINE SODIUM 25 MCG PO TABS
25.0000 ug | ORAL_TABLET | Freq: Every day | ORAL | Status: DC
  Administered 2024-02-02 – 2024-02-03 (×2): 25 ug via ORAL
  Filled 2024-02-01 (×2): qty 1

## 2024-02-01 MED ORDER — FENTANYL CITRATE PF 50 MCG/ML IJ SOSY
50.0000 ug | PREFILLED_SYRINGE | Freq: Once | INTRAMUSCULAR | Status: AC
  Administered 2024-02-01: 50 ug via INTRAVENOUS
  Filled 2024-02-01: qty 1

## 2024-02-01 MED ORDER — ACETAMINOPHEN 650 MG RE SUPP: 650.0000 mg | Freq: Four times a day (QID) | RECTAL | Status: DC | PRN

## 2024-02-01 MED ORDER — PREDNISONE 20 MG PO TABS
20.0000 mg | ORAL_TABLET | Freq: Every day | ORAL | Status: DC
Start: 2024-02-02 — End: 2024-02-03
  Administered 2024-02-02: 20 mg via ORAL
  Filled 2024-02-01 (×2): qty 1

## 2024-02-01 MED ORDER — HYDRALAZINE HCL 20 MG/ML IJ SOLN: 5.0000 mg | Freq: Four times a day (QID) | INTRAMUSCULAR | Status: DC | PRN

## 2024-02-01 MED ORDER — CALCIUM ACETATE (PHOS BINDER) 667 MG PO CAPS
667.0000 mg | ORAL_CAPSULE | Freq: Three times a day (TID) | ORAL | Status: DC
Start: 2024-02-02 — End: 2024-02-03
  Administered 2024-02-02 – 2024-02-03 (×4): 667 mg via ORAL
  Filled 2024-02-01 (×6): qty 1

## 2024-02-01 MED ORDER — PANTOPRAZOLE SODIUM 40 MG IV SOLR
40.0000 mg | Freq: Two times a day (BID) | INTRAVENOUS | Status: DC
  Administered 2024-02-01 – 2024-02-02 (×2): 40 mg via INTRAVENOUS
  Filled 2024-02-01 (×2): qty 10

## 2024-02-01 MED ORDER — HYDROXYCHLOROQUINE SULFATE 200 MG PO TABS
200.0000 mg | ORAL_TABLET | Freq: Every day | ORAL | Status: DC
  Administered 2024-02-01 – 2024-02-02 (×2): 200 mg via ORAL
  Filled 2024-02-01 (×5): qty 1

## 2024-02-01 MED ORDER — SODIUM CHLORIDE 0.9 % IV SOLN
1.0000 g | INTRAVENOUS | Status: DC
  Administered 2024-02-01 – 2024-02-02 (×2): 1 g via INTRAVENOUS
  Filled 2024-02-01 (×3): qty 10

## 2024-02-01 MED ORDER — SODIUM CHLORIDE 0.9% FLUSH
3.0000 mL | Freq: Two times a day (BID) | INTRAVENOUS | Status: DC
  Administered 2024-02-02 – 2024-02-03 (×3): 3 mL via INTRAVENOUS

## 2024-02-01 MED ORDER — CARVEDILOL 3.125 MG PO TABS
3.1250 mg | ORAL_TABLET | Freq: Two times a day (BID) | ORAL | Status: DC
Start: 2024-02-02 — End: 2024-02-03
  Administered 2024-02-02: 3.125 mg via ORAL
  Filled 2024-02-01 (×4): qty 1

## 2024-02-01 MED ORDER — METRONIDAZOLE 500 MG/100ML IV SOLN
500.0000 mg | Freq: Once | INTRAVENOUS | Status: AC
  Administered 2024-02-01: 500 mg via INTRAVENOUS
  Filled 2024-02-01: qty 100

## 2024-02-01 MED ORDER — ONDANSETRON HCL 4 MG/2ML IJ SOLN
4.0000 mg | Freq: Once | INTRAMUSCULAR | Status: AC
  Administered 2024-02-01: 4 mg via INTRAVENOUS
  Filled 2024-02-01: qty 2

## 2024-02-01 MED ORDER — FOLIC ACID 1 MG PO TABS
1.0000 mg | ORAL_TABLET | Freq: Every day | ORAL | Status: DC
  Administered 2024-02-01 – 2024-02-02 (×2): 1 mg via ORAL
  Filled 2024-02-01 (×3): qty 1

## 2024-02-01 MED ORDER — CARVEDILOL 3.125 MG PO TABS
6.2500 mg | ORAL_TABLET | Freq: Two times a day (BID) | ORAL | Status: DC
Start: 2024-02-01 — End: 2024-02-01
  Administered 2024-02-01: 6.25 mg via ORAL
  Filled 2024-02-01: qty 2

## 2024-02-01 MED ORDER — FENTANYL CITRATE PF 50 MCG/ML IJ SOSY
25.0000 ug | PREFILLED_SYRINGE | INTRAMUSCULAR | Status: AC | PRN
  Administered 2024-02-01 – 2024-02-02 (×2): 25 ug via INTRAVENOUS
  Filled 2024-02-01 (×2): qty 1

## 2024-02-01 MED ORDER — THIAMINE MONONITRATE 100 MG PO TABS
100.0000 mg | ORAL_TABLET | Freq: Every day | ORAL | Status: DC
  Administered 2024-02-01 – 2024-02-02 (×2): 100 mg via ORAL
  Filled 2024-02-01 (×4): qty 1

## 2024-02-01 NOTE — ED Notes (Signed)
Unable to draw blood x 2 attempts

## 2024-02-01 NOTE — Progress Notes (Signed)
 [facility] Progress Note   Subjective:    Patient discharged yesterday for LGIB/hemachezia (s/p 5 units PRBCs) and chronic ascites. Seen and examined patient at bedside. He reports returning back to the ED for worsening generalized weakness. Remains overloaded but not in distress. Plan for HD either tonight or tomorrow morning.  Objective Vitals:   02/01/24 1230 02/01/24 1345 02/01/24 1500 02/01/24 1545  BP: 113/80 102/83  100/73  Pulse: 98 90    Resp: (!) 25 16  18   Temp:   97.6 F (36.4 C)   TempSrc:   Oral   SpO2: 98% 98%    Weight:      Height:       Physical Exam General: Chronically ill-appearing; frail Heart: S1 and S2; RRR Lungs: Clear anteriorly, breathing unlabored Abdomen: Distended Extremities: b/l 2-3+ pitting edema Dialysis Access: AVF   [facility] Weights   02/01/24 0904  Weight: 59 kg   No intake or output data in the 24 hours ending 02/01/24 1635  Additional Objective Labs: Basic Metabolic Panel: Recent Labs  Lab 01/26/24 0341 01/27/24 0252 01/29/24 0453 01/30/24 0144 01/31/24 0338 02/01/24 1217 02/01/24 1218  NA 127* 131*   < > 132* 132* 129* 130*  K 4.4 4.3   < > 3.5 3.7 4.0 3.9  CL 96* 92*   < > 96* 95* 91*  --   CO2 23 22   < > 26 24 23   --   GLUCOSE 89 108*   < > 99 88 87  --   BUN 52* 63*   < > 32* 44* 60*  --   CREATININE 4.07* 4.98*   < > 3.14* 3.92* 4.88*  --   CALCIUM  7.2* 7.4*   < > 7.5* 7.5* 7.7*  --   PHOS 4.2 4.5  --   --   --   --   --    < > = values in this interval not displayed.   Liver Function Tests: Recent Labs  Lab 01/30/24 0144 01/31/24 0338 02/01/24 1217  AST 19 19 30   ALT 9 8 10   ALKPHOS 84 78 85  BILITOT 0.9 1.0 0.8  PROT 4.8* 4.7* 5.3*  ALBUMIN  1.5* <1.5* 1.7*   No results for input(s): LIPASE, AMYLASE in the last 168 hours. CBC: Recent Labs  Lab 01/28/24 0304 01/29/24 0453 01/30/24 0144 01/31/24 0338 02/01/24 1217 02/01/24 1218  WBC 14.3* 12.5* 12.2* 13.6* 17.9*  --    NEUTROABS  --  12.3* 10.8* 11.9*  --   --   HGB 9.8* 9.7* 9.4* 9.5* 10.5* 11.2*  HCT 28.6* 28.4* 27.6* 28.8* 32.6* 33.0*  MCV 86.4 85.8 86.5 88.3 89.8  --   PLT 75* 77* 73* 62* 71*  --    Blood Culture    Component Value Date/Time   SDES PERITONEAL 12/27/2023 1049   SDES PERITONEAL 12/27/2023 1049   SPECREQUEST NONE 12/27/2023 1049   SPECREQUEST NONE 12/27/2023 1049   CULT  12/27/2023 1049    NO GROWTH 3 DAYS Performed at [facility], [address], [place], [place] [address]    CULT  12/27/2023 1049    NO FUNGUS ISOLATED AFTER 21 DAYS Performed at [facility], [address] [place], [place] [address]    REPTSTATUS 12/30/2023 FINAL 12/27/2023 1049   REPTSTATUS 01/17/2024 FINAL 12/27/2023 1049    Cardiac Enzymes: Recent Labs  Lab 02/01/24 1217  CKTOTAL 33*   CBG: Recent Labs  Lab 01/25/24 2015 01/26/24  0818 02/01/24 1034  GLUCAP 85 85 82   Iron Studies: No results for input(s): IRON, TIBC, TRANSFERRIN, FERRITIN in the last 72 hours. Lab Results  Component Value Date   INR 1.0 01/24/2024   INR 1.2 12/30/2023   INR 1.2 12/23/2023   Studies/Results: CT ABDOMEN PELVIS WO CONTRAST Result Date: 02/01/2024 CLINICAL DATA:  Acute abdominal pain. EXAM: CT ABDOMEN AND PELVIS WITHOUT CONTRAST TECHNIQUE: Multidetector CT imaging of the abdomen and pelvis was performed following the standard protocol without IV contrast. RADIATION DOSE REDUCTION: This exam was performed according to the departmental dose-optimization program which includes automated exposure control, adjustment of the mA and/or kV according to patient size and/or use of iterative reconstruction technique. COMPARISON:  01/25/2024 FINDINGS: Lower chest: Again seen cardiomegaly. There is new nodular airspace disease within both lower lobes and right middle lobe. Trace left pleural effusion. Hepatobiliary: Small subcapsular fluid collection about the posterior right hepatic lobe, series 3,  image 37, diminished from prior exam. Cirrhotic hepatic morphology again seen. No evidence of new liver abnormality on this unenhanced exam. Gallbladder physiologically distended, no calcified stone. No biliary dilatation. Pancreas: No ductal dilatation or inflammation. Spleen: Stable upper normal spleen size at 13.2 cm with punctate granuloma. Adrenals/Urinary Tract: No adrenal nodule. Atrophic kidneys consistent with chronic renal disease. No hydronephrosis or renal calculi. Urinary bladder is decompressed. Stomach/Bowel: Bowel assessment is limited in the absence of enteric contrast. Air and fluid distention of the stomach with equivocal gastric wall thickening. No small bowel obstruction. Few prominent air-filled loops of small bowel are present in the upper abdomen. Near pan colonic wall thickening is new from prior exam. Small volume of liquid stool in the proximal colon, with formed stool in the distal colon. Air-filled transverse colon. No bowel pneumatosis. Descending and sigmoid diverticulosis without diverticulitis. Vascular/Lymphatic: Aortic atherosclerosis. No aortic aneurysm. Stable small retroperitoneal lymph nodes. No evidence of progressive adenopathy. Reproductive: Prostate is unremarkable. Fluid collection extends into the right hemiscrotum as before. Other: There is a left-sided abdominal wall fluid collection, which may represent a large rectus sheath hematoma. This extends from the lower margin of the anterior ribs to the inguinal canal. Measurements are difficult due to irregular shape, however measures at least 26 cm cranial caudal, 7 cm AP and 15.8 cm transverse. Upper aspect is homogeneous, heterogeneous and serpiginous densities in the lower component. Unchanged appearance of organized loculated peritoneal fluid collection extending from the left abdomen, coarsely Ng obliquely into the right inguinal canal. This is slightly diminished in size from prior exam with the U shaped component in  the abdominopelvic cavity tracking into the right pericolic gutter. A bilobed thick-walled component extends into the right inguinal canal and scrotum. There is no internal gas. Generalized body wall edema and edema of the intra-abdominal fat. Musculoskeletal: Chronic avascular necrosis of both femoral heads. No acute osseous findings. IMPRESSION: 1. New large left-sided abdominal wall fluid collection, which may represent a rectus sheath hematoma. This extends from the lower margin of the anterior ribs to the inguinal canal measures at least 26 x 7 x 15.8 cm. Sterility is technically indeterminate. 2. Unchanged appearance of organized loculated peritoneal fluid collection extending from the left abdomen, coursing obliquely into the right inguinal canal. This has slightly diminished in size from prior exam. 3. Near pan colonic wall thickening is new from prior exam, suspicious for colitis. 4. New nodular airspace disease within both lower lobes and right middle lobe, typical of pneumonia. Trace left pleural effusion. 5. Cirrhosis with small  subcapsular fluid collection about the posterior right hepatic lobe, diminished from prior exam. 6. Chronic avascular necrosis of both femoral heads. Aortic Atherosclerosis (ICD10-I70.0). Electronically Signed   By: Andrea Gasman M.D.   On: 02/01/2024 13:30   DG Chest Portable 1 View Result Date: 02/01/2024 CLINICAL DATA:  Weakness. EXAM: PORTABLE CHEST 1 VIEW COMPARISON:  December 14, 2023. FINDINGS: The heart size and mediastinal contours are within normal limits. Both lungs are clear. The visualized skeletal structures are unremarkable. IMPRESSION: No active disease. Electronically Signed   By: Lynwood Landy Raddle M.D.   On: 02/01/2024 11:38    Medications:  ceFEPime  (MAXIPIME ) IV     metronidazole      vancomycin        Dialysis Orders: TTS - AF 3:45hr, 400/A1.5, EDW 58kg, 2K/2Ca bath, AVF, no heparin  - Last HD 9/25, left at 65.7kg - up on fluids - Mircera 225mcg  IV q 2 weeks - last given 9/16. Last Hgb 8.6 on 9/25 - Hectoral on hold  Assessment/Plan: 1. Worsening weakness - Per primary 2. BRBPR, ?hemorrhoidal bleeding: Recent admit LGIB; s/p EGD: LA grade D reflux esophagitis w/o bleeding, portal hypertensive gastropathy w/spontaneous oozing and friability. No varices. S/p Colonoscopy with single ulcer in rectum - biopsied, and few ulcers in distal transverse colon & congested mucosa throughout. Medical management per GI.  3. ESRD -on HD TTS. Plan for HD either tonight or tomorrow morning 4. Anemia of CKD- Current Hgb 11.2-stable 5. Secondary hyperparathyroidism - Checking labs 6. HTN/volume - Overloaded on exam but not in distress. Push UF with HD as tolerated 7. SLE - On Plaquenil  and steroids.  8. Recurrent ascites - C/b recent peritonitis  6. Nutrition - Renal diet with fluid restrictions  Charmaine Piety, NP [facility] 02/01/2024,4:35 PM  LOS: 0 days

## 2024-02-01 NOTE — ED Provider Notes (Signed)
 [place] [facility]MENT AT [facility] Provider Note   CSN: [id number] Arrival date & time: 02/01/24  0900     History Chief Complaint  Patient presents with   Weakness    HPI: [name] is a 53 y.o. male with history pertinent for lupus, ESRD on [facility] TTS, HFrEF, pancytopenia, alcohol use disorder, malnutrition, prior SBP, cirrhosis who presents complaining of generalized weakness. Patient arrived via EMS.  History provided by patient.  No interpreter required during this encounter.  Patient reports that every 1 to 2 years he developed a lupus flare characterized by general weakness and arthralgias.  Reports that he feels like he is having 1 of these typical flares, states that it usually responds well to IV opioids and Toradol .  Reports that he is also due for dialysis today, however did not go to dialysis as he was feeling poorly therefore came to the emergency department instead.  Reports that he is bedbound at baseline, however has friends who help with his caretaking at home.  Reports that he has had several months of abdominal pain, was recently diagnosed with ulcers and had a colonoscopy to help with blockages.  Reports that he has still had persistent abdominal pain despite this.  Reports that he has been anuric for 2-1/2 years  Patient's recorded medical, surgical, social, medication list and allergies were reviewed in the Snapshot window as part of the initial history.   Prior to Admission medications   Medication Sig Start Date End Date Taking? Authorizing Provider  Calcium  Acetate 667 MG TABS Take 667 mg by mouth 3 (three) times daily before meals.   Yes Provider, Historical, [name]  carvedilol  (COREG ) 6.25 MG tablet Take 1 tablet (6.25 mg total) by mouth 2 (two) times daily with a meal. 01/31/24  Yes Singh, Prashant K, [name]  ciprofloxacin (CIPRO) 500 MG tablet Take 0.5 tablets (250 mg total) by mouth daily with breakfast. 01/31/24 03/01/24 Yes Singh, Prashant K,  [name]  folic acid  (FOLVITE ) 1 MG tablet Take 1 tablet (1 mg total) by mouth daily. 01/31/24  Yes Dennise Lavada POUR, [name]  hydroxychloroquine  (PLAQUENIL ) 200 MG tablet Take 1 tablet (200 mg total) by mouth daily. 01/03/24  Yes Gonfa, Taye T, [name]  levothyroxine  (SYNTHROID ) 25 MCG tablet Take 1 tablet (25 mcg total) by mouth daily before breakfast. Patient taking differently: Take 25 mcg by mouth daily. 01/04/24  Yes Gonfa, Taye T, [name]  ondansetron  (ZOFRAN -ODT) 4 MG disintegrating tablet Take 1 tablet (4 mg total) by mouth every 8 (eight) hours as needed for nausea or vomiting. 01/03/24  Yes Gonfa, Taye T, [name]  pantoprazole  (PROTONIX ) 40 MG tablet Take 1 tablet (40 mg total) by mouth 2 (two) times daily. 01/31/24  Yes Singh, Prashant K, [name]  polyethylene glycol powder (MIRALAX ) 17 GM/SCOOP powder Take 17 grams mixed in liquid by mouth 2 (two) times daily as needed for moderate constipation or mild constipation. 01/31/24  Yes Dennise Lavada POUR, [name]  predniSONE  (DELTASONE ) 10 MG tablet Take 2 tablets (20 mg total) by mouth daily. 01/31/24 03/01/24 Yes Dennise Lavada POUR, [name]  thiamine  (VITAMIN B1) 100 MG tablet Take 1 tablet (100 mg total) by mouth daily. 01/31/24  Yes Dennise Lavada POUR, [name]  traMADol  (ULTRAM ) 50 MG tablet Take 25 mg by mouth daily as needed (pain).   Yes Provider, Historical, [name]  Methoxy PEG-Epoetin Beta (MIRCERA IJ) Inject 225 mcg into the vein every 14 (fourteen) days. Administered at dialysis clinic    Provider, Historical,  [name]  multivitamin (RENA-VIT) TABS tablet Take 1 tablet by mouth at bedtime. Patient not taking: Reported on 01/24/2024 01/03/24   Gonfa, Taye T, [name]     Allergies: Patient has no known allergies.   Review of Systems   ROS as per HPI  Physical Exam Updated Vital Signs BP 119/68   Pulse 94   Temp 97.6 F (36.4 C) (Oral)   Resp (!) 22   Ht 5' 11 (1.803 m)   Wt 59 kg   SpO2 98%   BMI 18.13 kg/m  Physical Exam Vitals and nursing note reviewed.  Constitutional:      Appearance:  Normal appearance.     Comments: Cachectic and chronically ill-appearing  HENT:     Head: Normocephalic and atraumatic.  Eyes:     Extraocular Movements: Extraocular movements intact.  Cardiovascular:     Rate and Rhythm: Normal rate.     Pulses: Normal pulses.  Pulmonary:     Effort: Pulmonary effort is normal.     Breath sounds: Rhonchi (Scattered) present.  Abdominal:     General: Abdomen is flat.     Palpations: Abdomen is soft.     Tenderness: There is abdominal tenderness. There is guarding. There is no rebound.  Musculoskeletal:     Right lower leg: Edema (3+) present.     Left lower leg: Edema (3+) present.  Skin:    General: Skin is warm and dry.     Capillary Refill: Capillary refill takes less than 2 seconds.  Neurological:     General: No focal deficit present.     Mental Status: He is alert and oriented to person, place, and time.     ED Course/ Medical Decision Making/ A&P  Procedures Procedures   Medications Ordered in ED Medications  vancomycin  (VANCOCIN ) IVPB 1000 mg/200 mL premix (has no administration in time range)  metroNIDAZOLE  (FLAGYL ) IVPB 500 mg (has no administration in time range)  ceFEPIme  (MAXIPIME ) 1 g in sodium chloride  0.9 % 100 mL IVPB (1 g Intravenous New Bag/Given 02/01/24 1646)  hydroxychloroquine  (PLAQUENIL ) tablet 200 mg (has no administration in time range)  predniSONE  (DELTASONE ) tablet 20 mg (has no administration in time range)  fentaNYL  (SUBLIMAZE ) injection 50 mcg (50 mcg Intravenous Given 02/01/24 1224)  ketorolac  (TORADOL ) 15 MG/ML injection 15 mg (15 mg Intravenous Given 02/01/24 1226)  ondansetron  (ZOFRAN ) injection 4 mg (4 mg Intravenous Given 02/01/24 1223)    Medical Decision Making:   [name] is a 53 y.o. male who presents for weakness as per above.  Physical exam is pertinent for diffuse abdominal tenderness and guarding, diffuse rhonchi, bilateral lower extremity edema.   The differential includes but is not  limited to hyperkalemia, emergent electrolyte derangement, acidosis, sepsis, pneumonia, intra-abdominal process.  Independent historian: None  External data reviewed: Notes: Reviewed patient's recent discharge summary from yesterday, patient was admitted for GI bleed as well as Klebsiella SBP.  Patient had palliative care involved in hospitalization, and has poor prognosis per discharge note, however patient and family reportedly wanted to continue pursuing aggressive management  Labs: Ordered, Independent interpretation, and Details: VBG without acidosis.  Viral panel positive for COVID.  Magnesium  WNL.  Blood glucose WNL.  CK negative.  BNP elevated at 610.  CBC with worsening leukocytosis to 17.9, stable anemia and thrombocytopenia.  CMP with redemonstration of known ESRD related renal changes, worsening hyponatremia, no emergent electrolyte derangements, notably no hyperkalemia.  No emergent LFT abnormalities  Radiology: Ordered, Independent interpretation, and  Details: Personally reviewed chest x-ray, no focal airspace opacification, cardiomediastinal silhouette gentian, pneumothorax, pleural effusion, derangement.  CT with intra-abdominal fluid collections, as well as left-sided abdominal wall fluid collection CT ABDOMEN PELVIS WO CONTRAST Result Date: 02/01/2024 CLINICAL DATA:  Acute abdominal pain. EXAM: CT ABDOMEN AND PELVIS WITHOUT CONTRAST TECHNIQUE: Multidetector CT imaging of the abdomen and pelvis was performed following the standard protocol without IV contrast. RADIATION DOSE REDUCTION: This exam was performed according to the departmental dose-optimization program which includes automated exposure control, adjustment of the mA and/or kV according to patient size and/or use of iterative reconstruction technique. COMPARISON:  01/25/2024 FINDINGS: Lower chest: Again seen cardiomegaly. There is new nodular airspace disease within both lower lobes and right middle lobe. Trace left pleural  effusion. Hepatobiliary: Small subcapsular fluid collection about the posterior right hepatic lobe, series 3, image 37, diminished from prior exam. Cirrhotic hepatic morphology again seen. No evidence of new liver abnormality on this unenhanced exam. Gallbladder physiologically distended, no calcified stone. No biliary dilatation. Pancreas: No ductal dilatation or inflammation. Spleen: Stable upper normal spleen size at 13.2 cm with punctate granuloma. Adrenals/Urinary Tract: No adrenal nodule. Atrophic kidneys consistent with chronic renal disease. No hydronephrosis or renal calculi. Urinary bladder is decompressed. Stomach/Bowel: Bowel assessment is limited in the absence of enteric contrast. Air and fluid distention of the stomach with equivocal gastric wall thickening. No small bowel obstruction. Few prominent air-filled loops of small bowel are present in the upper abdomen. Near pan colonic wall thickening is new from prior exam. Small volume of liquid stool in the proximal colon, with formed stool in the distal colon. Air-filled transverse colon. No bowel pneumatosis. Descending and sigmoid diverticulosis without diverticulitis. Vascular/Lymphatic: Aortic atherosclerosis. No aortic aneurysm. Stable small retroperitoneal lymph nodes. No evidence of progressive adenopathy. Reproductive: Prostate is unremarkable. Fluid collection extends into the right hemiscrotum as before. Other: There is a left-sided abdominal wall fluid collection, which may represent a large rectus sheath hematoma. This extends from the lower margin of the anterior ribs to the inguinal canal. Measurements are difficult due to irregular shape, however measures at least 26 cm cranial caudal, 7 cm AP and 15.8 cm transverse. Upper aspect is homogeneous, heterogeneous and serpiginous densities in the lower component. Unchanged appearance of organized loculated peritoneal fluid collection extending from the left abdomen, coarsely Ng obliquely into  the right inguinal canal. This is slightly diminished in size from prior exam with the U shaped component in the abdominopelvic cavity tracking into the right pericolic gutter. A bilobed thick-walled component extends into the right inguinal canal and scrotum. There is no internal gas. Generalized body wall edema and edema of the intra-abdominal fat. Musculoskeletal: Chronic avascular necrosis of both femoral heads. No acute osseous findings. IMPRESSION: 1. New large left-sided abdominal wall fluid collection, which may represent a rectus sheath hematoma. This extends from the lower margin of the anterior ribs to the inguinal canal measures at least 26 x 7 x 15.8 cm. Sterility is technically indeterminate. 2. Unchanged appearance of organized loculated peritoneal fluid collection extending from the left abdomen, coursing obliquely into the right inguinal canal. This has slightly diminished in size from prior exam. 3. Near pan colonic wall thickening is new from prior exam, suspicious for colitis. 4. New nodular airspace disease within both lower lobes and right middle lobe, typical of pneumonia. Trace left pleural effusion. 5. Cirrhosis with small subcapsular fluid collection about the posterior right hepatic lobe, diminished from prior exam. 6. Chronic avascular necrosis of both  femoral heads. Aortic Atherosclerosis (ICD10-I70.0). Electronically Signed   By: Angel Gasman M.D.   On: 02/01/2024 13:30   DG Chest Portable 1 View Result Date: 02/01/2024 CLINICAL DATA:  Weakness. EXAM: PORTABLE CHEST 1 VIEW COMPARISON:  December 14, 2023. FINDINGS: The heart size and mediastinal contours are within normal limits. Both lungs are clear. The visualized skeletal structures are unremarkable. IMPRESSION: No active disease. Electronically Signed   By: Angel Landy Raddle M.D.   On: 02/01/2024 11:38   EKG/Medicine tests: Ordered and Independent interpretation EKG Interpretation: Sinus tachycardia Atrial premature complexes  Left atrial enlargement Abnormal R-wave progression, late transition LVH with secondary repolarization abnormality Anterior ST elevation, probably due to LVH Confirmed by [name] ([id number]) on 02/01/2024 5:02:07 PM                Interventions: Zofran , fentanyl , Toradol   See the EMR for full details regarding lab and imaging results.  Patient presents for generalized weakness in the setting of recent hospitalization and discharge.  Patient reports that it is consistent with his prior episodes of lupus related pain, strongly requests Toradol , as well as single dose of IV opioids, discussed increased risk of Toradol  given history of ESRD as well as recent GI bleed, patient expressed understanding of risks, administered with significant improvement in pain.  Patient also given Zofran  for nausea.  Broad lab workup indicated given symptoms of generalized weakness and chronically ill status with poor baseline.  Additionally, patient does not complain of abdominal pain he is diffusely significantly tender, therefore CT is indicated.  Workup reveals diminished CK, doubt rhabdomyolysis, mildly elevated BNP consistent with volume overload, i-STAT VBG without acidosis, no hyperkalemia, patient does not require emergent dialysis, however rather urgent dialysis in setting of ESRD.  Patient does have worsening leukocytosis.  Labs do also reveal COVID.  Patient was informed of this result.  Given patient is SIRS positive, do feel that patient warrants readmission for further management.  I did consult nephrology and spoke with Dr. Gearline who will reengage and follow for dialysis during patient's hospitalization.  Radiology read notes possible pneumonia, therefore will initiate patient on broad-spectrum antibiotics given he has significant rhonchi on exam and is SIRS positive.  Additionally patient has additional fluid collection in his abdominal wall, I consulted general surgery per request of hospitalist, and spoke  with Simaan, PA-C, who reviewed imaging and agrees with radiology interpretation of rectus sheath hematoma, and does not feel that this requires further intervention or evaluation by surgery at this time.  After discussion with Dr. Tobie with hospitalist, and obtaining these consults, she is willing to accept the patient to her service, no additional acute events while patient was under my care.  Presentation is most consistent with acute complicated illness and Current presentation is complicated by underlying chronic conditions  Discussion of management or test interpretations with external provider(s): Dr. Gearline, nephrology, Dr. Tobie, nephrology, Augustus, PA-C, general surgery  Risk Drugs:Prescription drug management and Parenteral controlled substances Treatment: Decision regarding hospitalization  Disposition: ADMIT: I believe the patient requires admission for further care and management. The patient was admitted to hospitalist with nephrology consult. Please see inpatient provider note for additional treatment plan details.   MDM generated using voice dictation software and may contain dictation errors.  Please contact me for any clarification or with any questions.  Clinical Impression:  1. COVID   2. SIRS (systemic inflammatory response syndrome) (HCC)   3. Intra-abdominal fluid collection      Admit  Final Clinical Impression(s) / ED Diagnoses Final diagnoses:  COVID  SIRS (systemic inflammatory response syndrome) (HCC)  Intra-abdominal fluid collection    Rx / DC Orders ED Discharge Orders     None        Angel Jerilynn RAMAN, [name] 02/01/24 1707

## 2024-02-01 NOTE — ED Triage Notes (Addendum)
 Pt having more generalized weakness today, supposed to have dialysis today. Pt left hospital yesterday. Denies headaches, numbness,tingling. Hx of lupus. Axox4. Pt has edema to bilateral legs.

## 2024-02-01 NOTE — ED Notes (Signed)
 Phlebotomy to collect outstanding labs.

## 2024-02-01 NOTE — ED Notes (Signed)
Patient states he does not make urine.   

## 2024-02-01 NOTE — ED Notes (Signed)
 Phlebotomy to collect outstanding blood work.

## 2024-02-01 NOTE — ED Notes (Addendum)
 Phlebotomy to collect outstanding blood work. This RN unable to obtain blood.

## 2024-02-01 NOTE — ED Notes (Signed)
IV Team at bedside 

## 2024-02-01 NOTE — H&P (Addendum)
 History and Physical    Patient: [name] DOB: 08-07-1970 DOA: 02/01/2024 DOS: the patient was seen and examined on 02/01/2024 . PCP: Berneta Elsie Sayre, MD  Patient coming from: Home Chief complaint: Chief Complaint  Patient presents with   Weakness   HPI:  [name] is a 53 y.o. male with past medical history  of end-stage renal disease on hemodialysis Tuesday Thursday and Saturday schedule, lupus and lupus nephritis, ascites, history of cirrhosis, history of GI bleed, history of portal hypertensive gastropathy, history of HFrEF, history of SBP, anemia presenting today--after being discharged yesterday for generalized weakness that patient noted over the past 24 hours.  There is history of patient declining evaluation for cirrhosis by GI.  Chart review shows patient also has a Unchanged appearance of organized loculated peritoneal fluid collection extending from the left abdomen, coursing obliquely into the right inguinal canal. This has slightly diminished in size from prior exam. Pt at bedside states he want PT to come back. Pt lives alone and has been weak ,in previous note pt has declined placement.  Pt has a bruise on left side of abdomen and is firm to touch nontender.  At bedside he is weak appearing and coughing has productive cough .  Nephrology at bedside with plan for HD today.   ED Course:  Vital signs in the ED were notable for the following:  Vitals:   02/01/24 1545 02/01/24 1645 02/01/24 1839 02/01/24 2000  BP: 100/73 119/68  109/64  Pulse:  94  99  Temp:   97.6 F (36.4 C)   Resp: 18 (!) 22  (!) 27  Height:      Weight:      SpO2:  98%  100%  TempSrc:   Oral   BMI (Calculated):       >>ED evaluation thus far shows: Venous blood gas showing pCO2 of 40.8 otherwise normal. Patient is not requiring oxygen and is not hypoxic and is stable. CMP showing sodium 129 normal potassium BUN of 60 creatinine 4.8 calcium  7.7 albumin  1.7 BNP  of 610 troponin of 33. CBC showing leukocytosis of 17.9 hemoglobin of 10.5 platelets of 71. Respiratory panel positive for COVID today. Chest xray shows nodular infiltrate with covid is c/w covid pneumonia/ CAP / or HCAP. >> Abnormal CT abdomen and pelvis without contrast showing 1. New large left-sided abdominal wall fluid collection, which may represent a rectus sheath hematoma. This extends from the lower margin of the anterior ribs to the inguinal canal measures at least 26 x 7 x 15.8 cm. Sterility is technically indeterminate. 2. Unchanged appearance of organized loculated peritoneal fluid collection extending from the left abdomen, coursing obliquely into the right inguinal canal. This has slightly diminished in size from prior exam. 3. Near pan colonic wall thickening is new from prior exam, suspicious for colitis. 4. New nodular airspace disease within both lower lobes and right middle lobe, typical of pneumonia. Trace left pleural effusion. 5. Cirrhosis with small subcapsular fluid collection about the posterior right hepatic lobe, diminished from prior exam. 6. Chronic avascular necrosis of both femoral heads.  >>While in the ED patient received the following: Medications  fentaNYL  (SUBLIMAZE ) injection 50 mcg (50 mcg Intravenous Given 02/01/24 1224)  ketorolac  (TORADOL ) 15 MG/ML injection 15 mg (15 mg Intravenous Given 02/01/24 1226)  ondansetron  (ZOFRAN ) injection 4 mg (4 mg Intravenous Given 02/01/24 1223)   Review of Systems  Constitutional:  Positive for malaise/fatigue.  HENT:  Positive for congestion.  Respiratory:  Positive for cough.   Neurological:  Positive for weakness.   Past Medical History:  Diagnosis Date   ESRD on hemodialysis (HCC)    TTS at [facility])   Lupus    Thyroid  disease    Past Surgical History:  Procedure Laterality Date   AV FISTULA PLACEMENT Left 08/26/2020   Procedure: LEFT RADIOCEPHALIC ARTERIOVENOUS (AV) FISTULA CREATION;   Surgeon: Eliza Lonni RAMAN, MD;  Location: [facility] OR;  Service: Vascular;  Laterality: Left;   BONE BIOPSY  01/29/2024   Procedure: BIOPSY, GI;  Surgeon: Rollin Dover, MD;  Location: [facility] ENDOSCOPY;  Service: Gastroenterology;;   COLONOSCOPY N/A 01/29/2024   Procedure: COLONOSCOPY;  Surgeon: Rollin Dover, MD;  Location: [facility] ENDOSCOPY;  Service: Gastroenterology;  Laterality: N/A;   ESOPHAGOGASTRODUODENOSCOPY N/A 01/29/2024   Procedure: EGD (ESOPHAGOGASTRODUODENOSCOPY);  Surgeon: Rollin Dover, MD;  Location: [facility] ENDOSCOPY;  Service: Gastroenterology;  Laterality: N/A;   IR FLUORO GUIDE CV LINE RIGHT  04/30/2020   IR PARACENTESIS  05/07/2020   IR PARACENTESIS  05/10/2020   IR PARACENTESIS  06/03/2020   IR PARACENTESIS  06/24/2020   IR PARACENTESIS  07/15/2020   IR PARACENTESIS  07/29/2020   IR PARACENTESIS  08/14/2020   IR PARACENTESIS  06/11/2021   IR PARACENTESIS  08/08/2021   IR PARACENTESIS  09/12/2021   IR PARACENTESIS  10/10/2021   IR PARACENTESIS  03/09/2022   IR PARACENTESIS  07/28/2023   IR PARACENTESIS  09/03/2023   IR PARACENTESIS  10/01/2023   IR PARACENTESIS  12/15/2023   IR PARACENTESIS  12/21/2023   IR PARACENTESIS  12/24/2023   IR PERC TUN PERIT CATH WO PORT S&I /IMAG  05/03/2020   IR US  GUIDE VASC ACCESS RIGHT  04/30/2020   IR US  GUIDE VASC ACCESS RIGHT  05/03/2020    reports that he has never smoked. He has never used smokeless tobacco. He reports current alcohol use. He reports that he does not use drugs. No Known Allergies Family History  Problem Relation Age of Onset   Cancer Mother    Prior to Admission medications   Medication Sig Start Date End Date Taking? Authorizing Provider  Calcium  Acetate 667 MG TABS Take 667 mg by mouth 3 (three) times daily before meals.    [provider]  carvedilol  (COREG ) 6.25 MG tablet Take 1 tablet (6.25 mg total) by mouth 2 (two) times daily with a meal. 01/31/24   Singh, Prashant K, MD  ciprofloxacin (CIPRO) 500 MG tablet Take 0.5 tablets (250 mg  total) by mouth daily with breakfast. 01/31/24 03/01/24  Singh, Prashant K, MD  folic acid  (FOLVITE ) 1 MG tablet Take 1 tablet (1 mg total) by mouth daily. 01/31/24   Singh, Prashant K, MD  hydroxychloroquine  (PLAQUENIL ) 200 MG tablet Take 1 tablet (200 mg total) by mouth daily. 01/03/24   Gonfa, Taye T, MD  levothyroxine  (SYNTHROID ) 25 MCG tablet Take 1 tablet (25 mcg total) by mouth daily before breakfast. Patient taking differently: Take 25 mcg by mouth daily. 01/04/24   Gonfa, Taye T, MD  Methoxy PEG-Epoetin Beta (MIRCERA IJ) Inject 225 mcg into the vein every 14 (fourteen) days. Administered at dialysis clinic    [provider]  multivitamin (RENA-VIT) TABS tablet Take 1 tablet by mouth at bedtime. Patient not taking: Reported on 01/24/2024 01/03/24   Gonfa, Taye T, MD  ondansetron  (ZOFRAN -ODT) 4 MG disintegrating tablet Take 1 tablet (4 mg total) by mouth every 8 (eight) hours as needed for nausea or vomiting. 01/03/24  Gonfa, Taye T, MD  pantoprazole  (PROTONIX ) 40 MG tablet Take 1 tablet (40 mg total) by mouth 2 (two) times daily. 01/31/24   Singh, Prashant K, MD  polyethylene glycol powder (MIRALAX ) 17 GM/SCOOP powder Take 17 grams mixed in liquid by mouth 2 (two) times daily as needed for moderate constipation or mild constipation. 01/31/24   Singh, Prashant K, MD  predniSONE  (DELTASONE ) 10 MG tablet Take 2 tablets (20 mg total) by mouth daily. 01/31/24 03/01/24  Dennise Lavada POUR, MD  thiamine  (VITAMIN B1) 100 MG tablet Take 1 tablet (100 mg total) by mouth daily. 01/31/24   Singh, Prashant K, MD  traMADol  (ULTRAM ) 50 MG tablet Take 25 mg by mouth daily as needed (pain).    [provider]                                                                                 Vitals:   02/01/24 1545 02/01/24 1645 02/01/24 1839 02/01/24 2000  BP: 100/73 119/68  109/64  Pulse:  94  99  Resp: 18 (!) 22  (!) 27  Temp:   97.6 F (36.4 C)   TempSrc:   Oral   SpO2:  98%  100%  Weight:       Height:       Physical Exam Vitals reviewed.  Constitutional:      General: He is not in acute distress.    Appearance: He is cachectic. He is ill-appearing.  HENT:     Head: Normocephalic and atraumatic.  Eyes:     Extraocular Movements: Extraocular movements intact.  Cardiovascular:     Rate and Rhythm: Normal rate and regular rhythm.     Pulses:          Dorsalis pedis pulses are 2+ on the right side and 2+ on the left side.       Posterior tibial pulses are 2+ on the right side and 2+ on the left side.     Heart sounds: Normal heart sounds.  Pulmonary:     Breath sounds: Normal breath sounds.  Abdominal:     General: There is no distension.     Palpations: Abdomen is soft.     Tenderness: There is no abdominal tenderness.  Musculoskeletal:     Right lower leg: 2+ Pitting Edema present.     Left lower leg: 2+ Pitting Edema present.  Neurological:     General: No focal deficit present.     Mental Status: He is alert and oriented to person, place, and time.     Labs on Admission: I have personally reviewed following labs and imaging studies CBC: Recent Labs  Lab 01/28/24 0304 01/29/24 0453 01/30/24 0144 01/31/24 0338 02/01/24 1217 02/01/24 1218  WBC 14.3* 12.5* 12.2* 13.6* 17.9*  --   NEUTROABS  --  12.3* 10.8* 11.9*  --   --   HGB 9.8* 9.7* 9.4* 9.5* 10.5* 11.2*  HCT 28.6* 28.4* 27.6* 28.8* 32.6* 33.0*  MCV 86.4 85.8 86.5 88.3 89.8  --   PLT 75* 77* 73* 62* 71*  --    Basic Metabolic Panel: Recent Labs  Lab 01/26/24 0341 01/27/24 0252 01/29/24 0453 01/30/24 0144  01/31/24 0338 02/01/24 1217 02/01/24 1218  NA 127* 131* 133* 132* 132* 129* 130*  K 4.4 4.3 3.1* 3.5 3.7 4.0 3.9  CL 96* 92* 94* 96* 95* 91*  --   CO2 23 22 23 26 24 23   --   GLUCOSE 89 108* 87 99 88 87  --   BUN 52* 63* 50* 32* 44* 60*  --   CREATININE 4.07* 4.98* 4.38* 3.14* 3.92* 4.88*  --   CALCIUM  7.2* 7.4* 7.4* 7.5* 7.5* 7.7*  --   MG  --   --  2.1 1.9 1.9 2.1  --   PHOS 4.2 4.5  --    --   --   --   --    GFR: Estimated Creatinine Clearance: 14.4 mL/min (A) (by C-G formula based on SCr of 4.88 mg/dL (H)). Liver Function Tests: Recent Labs  Lab 01/27/24 0252 01/29/24 0453 01/30/24 0144 01/31/24 0338 02/01/24 1217  AST  --  17 19 19 30   ALT  --  9 9 8 10   ALKPHOS  --  83 84 78 85  BILITOT  --  1.1 0.9 1.0 0.8  PROT  --  4.7* 4.8* 4.7* 5.3*  ALBUMIN  <1.5* 1.5* 1.5* <1.5* 1.7*   No results for input(s): LIPASE, AMYLASE in the last 168 hours. No results for input(s): AMMONIA in the last 168 hours. Recent Labs    01/01/24 0905 01/02/24 0821 01/24/24 0535 01/25/24 0257 01/26/24 0341 01/27/24 0252 01/29/24 0453 01/30/24 0144 01/31/24 0338 02/01/24 1217  BUN 55* 41* 97* 100* 52* 63* 50* 32* 44* 60*  CREATININE 6.03* 4.58* 6.19* 6.17* 4.07* 4.98* 4.38* 3.14* 3.92* 4.88*    Cardiac Enzymes: Recent Labs  Lab 02/01/24 1217  CKTOTAL 33*   BNP (last 3 results) No results for input(s): PROBNP in the last 8760 hours. HbA1C: No results for input(s): HGBA1C in the last 72 hours. CBG: Recent Labs  Lab 01/26/24 0818 02/01/24 1034  GLUCAP 85 82   Lipid Profile: No results for input(s): CHOL, HDL, LDLCALC, TRIG, CHOLHDL, LDLDIRECT in the last 72 hours. Thyroid  Function Tests: No results for input(s): TSH, T4TOTAL, FREET4, T3FREE, THYROIDAB in the last 72 hours. Anemia Panel: No results for input(s): VITAMINB12, FOLATE, FERRITIN, TIBC, IRON, RETICCTPCT in the last 72 hours. Urine analysis:    Component Value Date/Time   COLORURINE AMBER (A) 06/29/2021 0425   APPEARANCEUR HAZY (A) 06/29/2021 0425   LABSPEC 1.018 06/29/2021 0425   PHURINE 7.0 06/29/2021 0425   GLUCOSEU NEGATIVE 06/29/2021 0425   HGBUR SMALL (A) 06/29/2021 0425   BILIRUBINUR NEGATIVE 06/29/2021 0425   KETONESUR 5 (A) 06/29/2021 0425   PROTEINUR >=300 (A) 06/29/2021 0425   NITRITE NEGATIVE 06/29/2021 0425   LEUKOCYTESUR NEGATIVE 06/29/2021  0425   Radiological Exams on Admission: CT ABDOMEN PELVIS WO CONTRAST Result Date: 02/01/2024 CLINICAL DATA:  Acute abdominal pain. EXAM: CT ABDOMEN AND PELVIS WITHOUT CONTRAST TECHNIQUE: Multidetector CT imaging of the abdomen and pelvis was performed following the standard protocol without IV contrast. RADIATION DOSE REDUCTION: This exam was performed according to the departmental dose-optimization program which includes automated exposure control, adjustment of the mA and/or kV according to patient size and/or use of iterative reconstruction technique. COMPARISON:  01/25/2024 FINDINGS: Lower chest: Again seen cardiomegaly. There is new nodular airspace disease within both lower lobes and right middle lobe. Trace left pleural effusion. Hepatobiliary: Small subcapsular fluid collection about the posterior right hepatic lobe, series 3, image 37, diminished from prior exam. Cirrhotic hepatic morphology  again seen. No evidence of new liver abnormality on this unenhanced exam. Gallbladder physiologically distended, no calcified stone. No biliary dilatation. Pancreas: No ductal dilatation or inflammation. Spleen: Stable upper normal spleen size at 13.2 cm with punctate granuloma. Adrenals/Urinary Tract: No adrenal nodule. Atrophic kidneys consistent with chronic renal disease. No hydronephrosis or renal calculi. Urinary bladder is decompressed. Stomach/Bowel: Bowel assessment is limited in the absence of enteric contrast. Air and fluid distention of the stomach with equivocal gastric wall thickening. No small bowel obstruction. Few prominent air-filled loops of small bowel are present in the upper abdomen. Near pan colonic wall thickening is new from prior exam. Small volume of liquid stool in the proximal colon, with formed stool in the distal colon. Air-filled transverse colon. No bowel pneumatosis. Descending and sigmoid diverticulosis without diverticulitis. Vascular/Lymphatic: Aortic atherosclerosis. No aortic  aneurysm. Stable small retroperitoneal lymph nodes. No evidence of progressive adenopathy. Reproductive: Prostate is unremarkable. Fluid collection extends into the right hemiscrotum as before. Other: There is a left-sided abdominal wall fluid collection, which may represent a large rectus sheath hematoma. This extends from the lower margin of the anterior ribs to the inguinal canal. Measurements are difficult due to irregular shape, however measures at least 26 cm cranial caudal, 7 cm AP and 15.8 cm transverse. Upper aspect is homogeneous, heterogeneous and serpiginous densities in the lower component. Unchanged appearance of organized loculated peritoneal fluid collection extending from the left abdomen, coarsely Ng obliquely into the right inguinal canal. This is slightly diminished in size from prior exam with the U shaped component in the abdominopelvic cavity tracking into the right pericolic gutter. A bilobed thick-walled component extends into the right inguinal canal and scrotum. There is no internal gas. Generalized body wall edema and edema of the intra-abdominal fat. Musculoskeletal: Chronic avascular necrosis of both femoral heads. No acute osseous findings. IMPRESSION: 1. New large left-sided abdominal wall fluid collection, which may represent a rectus sheath hematoma. This extends from the lower margin of the anterior ribs to the inguinal canal measures at least 26 x 7 x 15.8 cm. Sterility is technically indeterminate. 2. Unchanged appearance of organized loculated peritoneal fluid collection extending from the left abdomen, coursing obliquely into the right inguinal canal. This has slightly diminished in size from prior exam. 3. Near pan colonic wall thickening is new from prior exam, suspicious for colitis. 4. New nodular airspace disease within both lower lobes and right middle lobe, typical of pneumonia. Trace left pleural effusion. 5. Cirrhosis with small subcapsular fluid collection about the  posterior right hepatic lobe, diminished from prior exam. 6. Chronic avascular necrosis of both femoral heads. Aortic Atherosclerosis (ICD10-I70.0). Electronically Signed   By: Andrea Gasman M.D.   On: 02/01/2024 13:30   DG Chest Portable 1 View Result Date: 02/01/2024 CLINICAL DATA:  Weakness. EXAM: PORTABLE CHEST 1 VIEW COMPARISON:  December 14, 2023. FINDINGS: The heart size and mediastinal contours are within normal limits. Both lungs are clear. The visualized skeletal structures are unremarkable. IMPRESSION: No active disease. Electronically Signed   By: Lynwood Landy Raddle M.D.   On: 02/01/2024 11:38   Data Reviewed: Relevant notes from primary care and specialist visits, past discharge summaries as available in EHR, including Care Everywhere . Prior diagnostic testing as pertinent to current admission diagnoses, Updated medications and problem lists for reconciliation .ED course, including vitals, labs, imaging, treatment and response to treatment,Triage notes, nursing and pharmacy notes and ED provider's notes.Notable results as noted in HPI.Discussed case with EDMD/ ED APP/  or Specialty MD on call and as needed.  Assessment & Plan    >>Generalized weakness/ Covid PNA: Pt is stable on RA and with his liver cirrhosis have not initiated remdesivir , also he does not meet criteria for same.  Cont with antitussives. Cont with antipyretics. Start cont pulse oxygen and follow, supplemental oxygen.   >>Decompensated liver cirrhosis: With ascites and thrombocytopenia, pt states he is not in transplant list.     >>Thrombocytopenia: Monitor . INR pending.    >>Anemia/rectal bleeding: Suspect from gastropathy and colopathy. H/H is stable. Type/screen. IV PPI.   >>Rectus sheath hematoma: 2/2 to thrombocytopenia no fall or trauma. Will follow h/h.    >>Hypothyroidism: Cont synthroid .    >>SLE: Resumed  plaquenil  and prednisone  once med rec is confirmed.    >>ESRD on HD due to lupus  nephritis : Nephrology on board.Pt to have HD today.  Resumed home meds.    DVT prophylaxis:  SCD's.  Consults:  Nephrology. General surgery.    Advance Care Planning:    Code Status: Full Code   Family Communication:  None  Disposition Plan:  TBD Severity of Illness: The appropriate patient status for this patient is INPATIENT. Inpatient status is judged to be reasonable and necessary in order to provide the required intensity of service to ensure the patient's safety. The patient's presenting symptoms, physical exam findings, and initial radiographic and laboratory data in the context of their chronic comorbidities is felt to place them at high risk for further clinical deterioration. Furthermore, it is not anticipated that the patient will be medically stable for discharge from the hospital within 2 midnights of admission.   * I certify that at the point of admission it is my clinical judgment that the patient will require inpatient hospital care spanning beyond 2 midnights from the point of admission due to high intensity of service, high risk for further deterioration and high frequency of surveillance required.*  Unresulted Labs (From admission, onward)     Start     Ordered   02/02/24 0500  Comprehensive metabolic panel  Tomorrow morning,   R        02/01/24 1814   02/02/24 0500  CBC  Tomorrow morning,   R        02/01/24 1814   02/01/24 2046  Hemoglobin and hematocrit, blood  ONCE - STAT,   STAT        02/01/24 2045   02/01/24 2046  Protime-INR  Add-on,   R       Comments: ADD TO AM SPECIMEN COLLECTED IN ED.    02/01/24 2046   02/01/24 1506  Protime-INR  Add-on,   AD        02/01/24 1506   02/01/24 1505  Blood culture (routine x 2)  BLOOD CULTURE X 2,   R      02/01/24 1505            Meds ordered this encounter  Medications   fentaNYL  (SUBLIMAZE ) injection 50 mcg   ketorolac  (TORADOL ) 15 MG/ML injection 15 mg   ondansetron  (ZOFRAN ) injection 4 mg    vancomycin  (VANCOCIN ) IVPB 1000 mg/200 mL premix    Indication::   Sepsis   metroNIDAZOLE  (FLAGYL ) IVPB 500 mg    Antibiotic Indication::   Intra-abdominal Infection   ceFEPIme  (MAXIPIME ) 1 g in sodium chloride  0.9 % 100 mL IVPB    Antibiotic Indication::   Sepsis   hydroxychloroquine  (PLAQUENIL ) tablet 200 mg   predniSONE  (DELTASONE ) tablet  20 mg   calcium  acetate (PHOSLO) capsule 667 mg   DISCONTD: carvedilol  (COREG ) tablet 6.25 mg   folic acid  (FOLVITE ) tablet 1 mg   levothyroxine  (SYNTHROID ) tablet 25 mcg   thiamine  (VITAMIN B1) tablet 100 mg   pantoprazole  (PROTONIX ) injection 40 mg   Chlorhexidine  Gluconate Cloth 2 % PADS 6 each   sodium chloride  flush (NS) 0.9 % injection 3 mL   OR Linked Order Group    acetaminophen  (TYLENOL ) tablet 650 mg    acetaminophen  (TYLENOL ) suppository 650 mg   hydrALAZINE  (APRESOLINE ) injection 5 mg   fentaNYL  (SUBLIMAZE ) injection 25 mcg   carvedilol  (COREG ) tablet 3.125 mg     Orders Placed This Encounter  Procedures   Resp panel by RT-PCR (RSV, Flu A&B, Covid) Anterior Nasal Swab   Blood culture (routine x 2)   DG Chest Portable 1 View   CT ABDOMEN PELVIS WO CONTRAST   Comprehensive metabolic panel   CBC   Magnesium    CK   Brain natriuretic peptide   Protime-INR   Comprehensive metabolic panel   CBC   Hemoglobin and hematocrit, blood   Protime-INR   Diet renal with fluid restriction Fluid restriction: 1200 mL Fluid; Room service appropriate? Yes with Assist; Fluid consistency: Nectar Thick   Document Height and Actual Weight   Informed Consent Details: Physician/Practitioner Attestation; Transcribe to consent form and obtain patient signature   Pre-Hemodialysis Protocol - Day of Dialysis   Post-Dialysis Protocol - Day of Dialysis   Maintain IV access   Vital signs   Notify physician (specify)   Refer to Sidebar Report Mobility Protocol for Adult Inpatient   Initiate Adult Central Line Maintenance and Catheter Protocol for patients  with central line (CVC, PICC, Port, Hemodialysis, Trialysis)   Daily weights   Intake and Output   Initiate CHG Protocol   Do not place and if present remove PureWick   Initiate Oral Care Protocol   Initiate Carrier Fluid Protocol   RN may order General Admission PRN Orders utilizing General Admission PRN medications (through manage orders) for the following patient needs: allergy symptoms (Claritin), cold sores (Carmex), cough (Robitussin DM), eye irritation (Liquifilm Tears), hemorrhoids (Tucks), indigestion (Maalox), minor skin irritation (Hydrocortisone Cream), muscle pain (Ben Gay), nose irritation (saline nasal spray) and sore throat (Chloraseptic spray).   Cardiac Monitoring - Continuous Indefinite   Ambulate with assistance   Full code   Consult to hospitalist   Consult to nephrology  Dialysis?   Consult to general surgery   pharmacy consult   Airborne and Contact precautions   Pulse oximetry check with vital signs   Oxygen therapy Mode or (Route): Nasal cannula; Liters Per Minute: 2; Keep O2 saturation between: greater than 92 %   CBG monitoring, ED   I-Stat venous blood gas, ([facility] ED, [facility], [facility])   I-Stat CG4 Lactic Acid   ED EKG   EKG 12-Lead   Hemodialysis inpatient   Admit to Inpatient (patient's expected length of stay will be greater than 2 midnights or inpatient only procedure)    Author: Mario LULLA Blanch, MD 12 pm- 8 pm. [facility]. 02/01/2024 8:46 PM Please note for any communication after hours contact [facility] Assigned provider on call on Amion.

## 2024-02-02 ENCOUNTER — Ambulatory Visit: Admitting: Infectious Disease

## 2024-02-02 ENCOUNTER — Ambulatory Visit: Admitting: Internal Medicine

## 2024-02-02 DIAGNOSIS — R531 Weakness: Secondary | ICD-10-CM | POA: Diagnosis not present

## 2024-02-02 LAB — CBC
HCT: 30.8 % — ABNORMAL LOW (ref 39.0–52.0)
Hemoglobin: 10 g/dL — ABNORMAL LOW (ref 13.0–17.0)
MCH: 28.9 pg (ref 26.0–34.0)
MCHC: 32.5 g/dL (ref 30.0–36.0)
MCV: 89 fL (ref 80.0–100.0)
Platelets: 50 K/uL — ABNORMAL LOW (ref 150–400)
RBC: 3.46 MIL/uL — ABNORMAL LOW (ref 4.22–5.81)
RDW: 16.8 % — ABNORMAL HIGH (ref 11.5–15.5)
WBC: 17.5 K/uL — ABNORMAL HIGH (ref 4.0–10.5)
nRBC: 0 % (ref 0.0–0.2)

## 2024-02-02 LAB — TSH: TSH: 14.689 u[IU]/mL — ABNORMAL HIGH (ref 0.350–4.500)

## 2024-02-02 LAB — COMPREHENSIVE METABOLIC PANEL WITH GFR
ALT: 9 U/L (ref 0–44)
AST: 27 U/L (ref 15–41)
Albumin: 1.6 g/dL — ABNORMAL LOW (ref 3.5–5.0)
Alkaline Phosphatase: 85 U/L (ref 38–126)
Anion gap: 17 — ABNORMAL HIGH (ref 5–15)
BUN: 71 mg/dL — ABNORMAL HIGH (ref 6–20)
CO2: 22 mmol/L (ref 22–32)
Calcium: 7.7 mg/dL — ABNORMAL LOW (ref 8.9–10.3)
Chloride: 90 mmol/L — ABNORMAL LOW (ref 98–111)
Creatinine, Ser: 5.43 mg/dL — ABNORMAL HIGH (ref 0.61–1.24)
GFR, Estimated: 12 mL/min — ABNORMAL LOW (ref >60)
Glucose, Bld: 97 mg/dL (ref 70–99)
Potassium: 4.2 mmol/L (ref 3.5–5.1)
Sodium: 129 mmol/L — ABNORMAL LOW (ref 135–145)
Total Bilirubin: 0.7 mg/dL (ref 0.0–1.2)
Total Protein: 5.1 g/dL — ABNORMAL LOW (ref 6.5–8.1)

## 2024-02-02 LAB — BRAIN NATRIURETIC PEPTIDE: B Natriuretic Peptide: 588.3 pg/mL — ABNORMAL HIGH (ref 0.0–100.0)

## 2024-02-02 LAB — PROTIME-INR
INR: 1.1 (ref 0.8–1.2)
Prothrombin Time: 14.3 s (ref 11.4–15.2)

## 2024-02-02 LAB — T4, FREE: Free T4: 0.4 ng/dL — ABNORMAL LOW (ref 0.61–1.12)

## 2024-02-02 LAB — PROCALCITONIN: Procalcitonin: 1.99 ng/mL

## 2024-02-02 MED ORDER — HEPARIN SODIUM (PORCINE) 1000 UNIT/ML DIALYSIS
1000.0000 [IU] | INTRAMUSCULAR | Status: DC | PRN
Start: 2024-02-02 — End: 2024-02-04

## 2024-02-02 MED ORDER — PENTAFLUOROPROP-TETRAFLUOROETH EX AERO
INHALATION_SPRAY | CUTANEOUS | Status: AC
  Filled 2024-02-02: qty 30

## 2024-02-02 MED ORDER — LIDOCAINE-PRILOCAINE 2.5-2.5 % EX CREA: 1.0000 | TOPICAL_CREAM | CUTANEOUS | Status: DC | PRN

## 2024-02-02 MED ORDER — TRAZODONE HCL 50 MG PO TABS
50.0000 mg | ORAL_TABLET | Freq: Every evening | ORAL | Status: DC | PRN
  Administered 2024-02-02: 50 mg via ORAL
  Filled 2024-02-02: qty 1

## 2024-02-02 MED ORDER — GUAIFENESIN 100 MG/5ML PO LIQD: 5.0000 mL | ORAL | Status: DC | PRN

## 2024-02-02 MED ORDER — LIDOCAINE HCL (PF) 1 % IJ SOLN: 5.0000 mL | INTRAMUSCULAR | Status: DC | PRN

## 2024-02-02 MED ORDER — ALBUMIN HUMAN 25 % IV SOLN
25.0000 g | Freq: Once | INTRAVENOUS | Status: AC
  Administered 2024-02-02: 25 g via INTRAVENOUS
  Filled 2024-02-02: qty 100

## 2024-02-02 MED ORDER — ALTEPLASE 2 MG IJ SOLR: 2.0000 mg | Freq: Once | INTRAMUSCULAR | Status: DC | PRN

## 2024-02-02 MED ORDER — IPRATROPIUM-ALBUTEROL 0.5-2.5 (3) MG/3ML IN SOLN: 3.0000 mL | RESPIRATORY_TRACT | Status: DC | PRN

## 2024-02-02 MED ORDER — PANTOPRAZOLE SODIUM 40 MG PO TBEC
40.0000 mg | DELAYED_RELEASE_TABLET | Freq: Two times a day (BID) | ORAL | Status: DC
  Filled 2024-02-02 (×3): qty 1

## 2024-02-02 MED ORDER — GUAIFENESIN ER 600 MG PO TB12
1200.0000 mg | ORAL_TABLET | Freq: Two times a day (BID) | ORAL | Status: DC
  Administered 2024-02-02: 1200 mg via ORAL
  Filled 2024-02-02 (×3): qty 2

## 2024-02-02 MED ORDER — PENTAFLUOROPROP-TETRAFLUOROETH EX AERO
1.0000 | INHALATION_SPRAY | CUTANEOUS | Status: DC | PRN
  Administered 2024-02-02: 1 via TOPICAL

## 2024-02-02 MED ORDER — CALCIUM GLUCONATE-NACL 2-0.675 GM/100ML-% IV SOLN
2.0000 g | Freq: Once | INTRAVENOUS | Status: AC
  Administered 2024-02-02: 2000 mg via INTRAVENOUS
  Filled 2024-02-02: qty 100

## 2024-02-02 MED ORDER — SENNOSIDES-DOCUSATE SODIUM 8.6-50 MG PO TABS: 1.0000 | ORAL_TABLET | Freq: Every evening | ORAL | Status: DC | PRN

## 2024-02-02 NOTE — ED Notes (Signed)
 This RN attempted to collect overdue and due labs. Unable to collect. MD made aware

## 2024-02-02 NOTE — Progress Notes (Signed)
 [facility] Progress Note   Subjective:   Seen in room.  Not eating a whole lot-doesn't like the food.    Objective Vitals:   02/02/24 1045 02/02/24 1100 02/02/24 1106 02/02/24 1130  BP: (!) 131/91 135/70  129/71  Pulse: 91 (!) 102 99 95  Resp: 16 (!) 28 16 (!) 37  Temp:   (!) 97.3 F (36.3 C)   TempSrc:   Oral   SpO2: 100% 100% 100% 100%  Weight:      Height:       Physical Exam General: Chronically ill-appearing; frail Heart: S1 and S2; RRR Lungs: Clear anteriorly, breathing unlabored Abdomen: Distended Extremities: b/l 2-3+ pitting edema Dialysis Access: L RC AVF   Filed Weights   02/01/24 0904  Weight: 59 kg    Intake/Output Summary (Last 24 hours) at 02/02/2024 1231 Last data filed at 02/01/2024 2028 Gross per 24 hour  Intake 200 ml  Output --  Net 200 ml    Additional Objective Labs: Basic Metabolic Panel: Recent Labs  Lab 01/27/24 0252 01/29/24 0453 01/30/24 0144 01/31/24 0338 02/01/24 1217 02/01/24 1218  NA 131*   < > 132* 132* 129* 130*  K 4.3   < > 3.5 3.7 4.0 3.9  CL 92*   < > 96* 95* 91*  --   CO2 22   < > 26 24 23   --   GLUCOSE 108*   < > 99 88 87  --   BUN 63*   < > 32* 44* 60*  --   CREATININE 4.98*   < > 3.14* 3.92* 4.88*  --   CALCIUM  7.4*   < > 7.5* 7.5* 7.7*  --   PHOS 4.5  --   --   --   --   --    < > = values in this interval not displayed.   Liver Function Tests: Recent Labs  Lab 01/30/24 0144 01/31/24 0338 02/01/24 1217  AST 19 19 30   ALT 9 8 10   ALKPHOS 84 78 85  BILITOT 0.9 1.0 0.8  PROT 4.8* 4.7* 5.3*  ALBUMIN  1.5* <1.5* 1.7*   No results for input(s): LIPASE, AMYLASE in the last 168 hours. CBC: Recent Labs  Lab 01/28/24 0304 01/29/24 0453 01/30/24 0144 01/31/24 0338 02/01/24 1217 02/01/24 1218  WBC 14.3* 12.5* 12.2* 13.6* 17.9*  --   NEUTROABS  --  12.3* 10.8* 11.9*  --   --   HGB 9.8* 9.7* 9.4* 9.5* 10.5* 11.2*  HCT 28.6* 28.4* 27.6* 28.8* 32.6* 33.0*  MCV 86.4 85.8 86.5 88.3 89.8   --   PLT 75* 77* 73* 62* 71*  --    Blood Culture    Component Value Date/Time   SDES PERITONEAL 12/27/2023 1049   SDES PERITONEAL 12/27/2023 1049   SPECREQUEST NONE 12/27/2023 1049   SPECREQUEST NONE 12/27/2023 1049   CULT  12/27/2023 1049    NO GROWTH 3 DAYS Performed at [facility], [address] [place], [place] [address]    CULT  12/27/2023 1049    NO FUNGUS ISOLATED AFTER 21 DAYS Performed at [facility], [address], [place], [place] [address]    REPTSTATUS 12/30/2023 FINAL 12/27/2023 1049   REPTSTATUS 01/17/2024 FINAL 12/27/2023 1049    Cardiac Enzymes: Recent Labs  Lab 02/01/24 1217  CKTOTAL 33*   CBG: Recent Labs  Lab 02/01/24 1034  GLUCAP 82   Iron Studies: No results for input(s): IRON, TIBC, TRANSFERRIN, FERRITIN in  the last 72 hours. Lab Results  Component Value Date   INR 1.0 01/24/2024   INR 1.2 12/30/2023   INR 1.2 12/23/2023   Studies/Results: CT ABDOMEN PELVIS WO CONTRAST Result Date: 02/01/2024 CLINICAL DATA:  Acute abdominal pain. EXAM: CT ABDOMEN AND PELVIS WITHOUT CONTRAST TECHNIQUE: Multidetector CT imaging of the abdomen and pelvis was performed following the standard protocol without IV contrast. RADIATION DOSE REDUCTION: This exam was performed according to the departmental dose-optimization program which includes automated exposure control, adjustment of the mA and/or kV according to patient size and/or use of iterative reconstruction technique. COMPARISON:  01/25/2024 FINDINGS: Lower chest: Again seen cardiomegaly. There is new nodular airspace disease within both lower lobes and right middle lobe. Trace left pleural effusion. Hepatobiliary: Small subcapsular fluid collection about the posterior right hepatic lobe, series 3, image 37, diminished from prior exam. Cirrhotic hepatic morphology again seen. No evidence of new liver abnormality on this unenhanced exam. Gallbladder physiologically distended, no calcified  stone. No biliary dilatation. Pancreas: No ductal dilatation or inflammation. Spleen: Stable upper normal spleen size at 13.2 cm with punctate granuloma. Adrenals/Urinary Tract: No adrenal nodule. Atrophic kidneys consistent with chronic renal disease. No hydronephrosis or renal calculi. Urinary bladder is decompressed. Stomach/Bowel: Bowel assessment is limited in the absence of enteric contrast. Air and fluid distention of the stomach with equivocal gastric wall thickening. No small bowel obstruction. Few prominent air-filled loops of small bowel are present in the upper abdomen. Near pan colonic wall thickening is new from prior exam. Small volume of liquid stool in the proximal colon, with formed stool in the distal colon. Air-filled transverse colon. No bowel pneumatosis. Descending and sigmoid diverticulosis without diverticulitis. Vascular/Lymphatic: Aortic atherosclerosis. No aortic aneurysm. Stable small retroperitoneal lymph nodes. No evidence of progressive adenopathy. Reproductive: Prostate is unremarkable. Fluid collection extends into the right hemiscrotum as before. Other: There is a left-sided abdominal wall fluid collection, which may represent a large rectus sheath hematoma. This extends from the lower margin of the anterior ribs to the inguinal canal. Measurements are difficult due to irregular shape, however measures at least 26 cm cranial caudal, 7 cm AP and 15.8 cm transverse. Upper aspect is homogeneous, heterogeneous and serpiginous densities in the lower component. Unchanged appearance of organized loculated peritoneal fluid collection extending from the left abdomen, coarsely Ng obliquely into the right inguinal canal. This is slightly diminished in size from prior exam with the U shaped component in the abdominopelvic cavity tracking into the right pericolic gutter. A bilobed thick-walled component extends into the right inguinal canal and scrotum. There is no internal gas. Generalized  body wall edema and edema of the intra-abdominal fat. Musculoskeletal: Chronic avascular necrosis of both femoral heads. No acute osseous findings. IMPRESSION: 1. New large left-sided abdominal wall fluid collection, which may represent a rectus sheath hematoma. This extends from the lower margin of the anterior ribs to the inguinal canal measures at least 26 x 7 x 15.8 cm. Sterility is technically indeterminate. 2. Unchanged appearance of organized loculated peritoneal fluid collection extending from the left abdomen, coursing obliquely into the right inguinal canal. This has slightly diminished in size from prior exam. 3. Near pan colonic wall thickening is new from prior exam, suspicious for colitis. 4. New nodular airspace disease within both lower lobes and right middle lobe, typical of pneumonia. Trace left pleural effusion. 5. Cirrhosis with small subcapsular fluid collection about the posterior right hepatic lobe, diminished from prior exam. 6. Chronic avascular necrosis of both femoral heads.  Aortic Atherosclerosis (ICD10-I70.0). Electronically Signed   By: Andrea Gasman M.D.   On: 02/01/2024 13:30   DG Chest Portable 1 View Result Date: 02/01/2024 CLINICAL DATA:  Weakness. EXAM: PORTABLE CHEST 1 VIEW COMPARISON:  December 14, 2023. FINDINGS: The heart size and mediastinal contours are within normal limits. Both lungs are clear. The visualized skeletal structures are unremarkable. IMPRESSION: No active disease. Electronically Signed   By: Lynwood Landy Raddle M.D.   On: 02/01/2024 11:38    Medications:  ceFEPime  (MAXIPIME ) IV Stopped (02/02/24 1129)    calcium  acetate  667 mg Oral TID AC   carvedilol   3.125 mg Oral BID WC   Chlorhexidine  Gluconate Cloth  6 each Topical Q0600   folic acid   1 mg Oral Daily   hydroxychloroquine   200 mg Oral Daily   levothyroxine   25 mcg Oral Daily   pantoprazole  (PROTONIX ) IV  40 mg Intravenous Q12H   predniSONE   20 mg Oral Q breakfast   sodium chloride  flush  3  mL Intravenous Q12H   thiamine   100 mg Oral Daily    Dialysis Orders: TTS - AF 3:45hr, 400/A1.5, EDW 58kg, 2K/2Ca bath, AVF, no heparin  - Last HD 9/25, left at 65.7kg - up on fluids - Mircera 225mcg IV q 2 weeks - last given 9/16. Last Hgb 8.6 on 9/25 - Hectoral on hold  Assessment/Plan: 1. Worsening weakness - Per primary 2. BRBPR, ?hemorrhoidal bleeding: Recent admit [facility]; s/p EGD: LA grade D reflux esophagitis w/o bleeding, portal hypertensive gastropathy w/spontaneous oozing and friability. No varices. S/p Colonoscopy with single ulcer in rectum - biopsied, and few ulcers in distal transverse colon & congested mucosa throughout.  3. ESRD -on HD TTS. Plan for HD today 10/8 off schedule 4. Anemia of CKD- Current Hgb 11.2-stable 5. Secondary hyperparathyroidism - Checking labs 6. HTN/volume - Overloaded on exam but not in distress. Push UF with HD as tolerated 7. SLE - On Plaquenil  and steroids.  8. Recurrent ascites - C/b recent peritonitis  6. Nutrition - Renal diet with fluid restrictions  Almarie Bonine MD [facility] 02/02/2024,12:31 PM  LOS: 1 day

## 2024-02-02 NOTE — Hospital Course (Addendum)
 Brief Narrative:  53 year old with history of ESRD TTS, lupus, cirrhosis with ascites, GI bleed, portal hypertensive gastropathy, CHF with reduced EF, anemia presented after being discharged within the last 24 hours for weakness.  Patient recently admitted from 9/29 - 01/31/2024 due to acute blood loss anemia secondary to rectal ulcer seen on colonoscopy in grade D esophagitis on EGD.  This was treated with octreotide, PPI.  He is now admitted for COVID-19 causing generalized weakness.   Assessment & Plan:  Generalized weakness COVID-pneumonia -Patient found to have COVID-pneumonia diagnosed on 10/17.  Currently she is on room air without any signs of respiratory compromise.  Continue to provide supportive care. - Check TSH, vitamin D  -Concerns of pneumonia on the CT scan.  No need for antibiotics.  Decompensated liver cirrhosis Thrombocytopenia/ascites GI bleed with acute blood loss anemia - Patient was recently admitted for acute blood loss anemia requiring multiple PRBC transfusion.  Colonoscopy had suggested sterile coral ulcer and EGD showing esophagitis.  Patient was placed on PPI twice daily.  Continue to closely monitor this.  Patient follows Dr. Rollin from GI.  Will consult them if necessary  Large rectus sheath hematoma; New - Left lower quadrant.  Hemoglobin appears to be stable for now.  Closely monitor this.  Hypocalcemia - Repletion  Hypothyroidism - Synthroid   SLE -On prednisone  and Plaquenil   CHF with preserved EF - On Coreg   Essential hypertension - Coreg .  IV as needed  ESRD on HD due to lupus nephritis - Nephrology following, plans for HD later today   DVT prophylaxis: Place and maintain sequential compression device Start: 02/02/24 9077    Code Status: Full Code Family Communication: Family at bedside Status is: Inpatient Remains inpatient appropriate because: Continue hospital stay for weakness.  Hopefully discharge in next 24-48 hours   PT Follow  up Recs:   Subjective:  Seen and examined at bedside.  Tells me he just feels very weak and denies any respiratory issues at this time.  Denies any other complaints.  Tells me he is tolerating p.o. without any issues  Examination:  General exam: Appears calm and comfortable  Respiratory system: Clear to auscultation. Respiratory effort normal. Cardiovascular system: S1 & S2 heard, RRR. No JVD, murmurs, rubs, gallops or clicks. No pedal edema. Gastrointestinal system: Abdomen is nondistended, soft and nontender. No organomegaly or masses felt. Normal bowel sounds heard. Central nervous system: Alert and oriented. No focal neurological deficits. Extremities: Symmetric 5 x 5 power. Skin: No rashes, lesions or ulcers Psychiatry: Judgement and insight appear normal. Mood & affect appropriate.

## 2024-02-02 NOTE — ED Notes (Signed)
 Phlebotomist RT stuck the patient to obtain overdue and current labs. Unable to obtain due to arm restriction and existing scar tissue. RN RG aware -RT

## 2024-02-02 NOTE — ED Notes (Signed)
 Unable to obtain labs and cultures, Pt is restricted left arm, right arm has scarring and slightly swollen. This is the 3rd phleb to try. RN Dena aware

## 2024-02-02 NOTE — Progress Notes (Addendum)
 PROGRESS NOTE    [name]  FMW:[id number] DOB: 04-21-1971 DOA: 02/01/2024 PCP: Berneta Elsie Sayre, MD    Brief Narrative:  53 year old with history of ESRD TTS, lupus, cirrhosis with ascites, GI bleed, portal hypertensive gastropathy, CHF with reduced EF, anemia presented after being discharged within the last 24 hours for weakness.  Patient recently admitted from 9/29 - 01/31/2024 due to acute blood loss anemia secondary to rectal ulcer seen on colonoscopy in grade D esophagitis on EGD.  This was treated with octreotide, PPI.  He is now admitted for COVID-19 causing generalized weakness.   Assessment & Plan:  Generalized weakness COVID-pneumonia -Patient found to have COVID-pneumonia diagnosed on 10/17.  Currently she is on room air without any signs of respiratory compromise.  Continue to provide supportive care. - Check TSH, vitamin D  -Concerns of pneumonia on the CT scan.  No need for antibiotics.  Decompensated liver cirrhosis Thrombocytopenia/ascites GI bleed with acute blood loss anemia - Patient was recently admitted for acute blood loss anemia requiring multiple PRBC transfusion.  Colonoscopy had suggested sterile coral ulcer and EGD showing esophagitis.  Patient was placed on PPI twice daily.  Continue to closely monitor this.  Patient follows Dr. Rollin from GI.  Will consult them if necessary  Large rectus sheath hematoma; New - Left lower quadrant.  Hemoglobin appears to be stable for now.  Closely monitor this.  Hypocalcemia - Repletion  Hypothyroidism - Synthroid   SLE -On prednisone  and Plaquenil   CHF with preserved EF - On Coreg   Essential hypertension - Coreg .  IV as needed  ESRD on HD due to lupus nephritis - Nephrology following, plans for HD later today   DVT prophylaxis: Place and maintain sequential compression device Start: 02/02/24 9077    Code Status: Full Code Family Communication: Family at bedside Status is: Inpatient Remains  inpatient appropriate because: Continue hospital stay for weakness.  Hopefully discharge in next 24-48 hours   PT Follow up Recs:   Subjective:  Seen and examined at bedside.  Tells me he just feels very weak and denies any respiratory issues at this time.  Denies any other complaints.  Tells me he is tolerating p.o. without any issues  Examination:  General exam: Appears calm and comfortable  Respiratory system: Clear to auscultation. Respiratory effort normal. Cardiovascular system: S1 & S2 heard, RRR. No JVD, murmurs, rubs, gallops or clicks. No pedal edema. Gastrointestinal system: Abdomen is nondistended, soft and nontender. No organomegaly or masses felt. Normal bowel sounds heard. Central nervous system: Alert and oriented. No focal neurological deficits. Extremities: Symmetric 5 x 5 power. Skin: No rashes, lesions or ulcers Psychiatry: Judgement and insight appear normal. Mood & affect appropriate.                Diet Orders (From admission, onward)     Start     Ordered   02/01/24 1814  Diet renal with fluid restriction Fluid restriction: 1200 mL Fluid; Room service appropriate? Yes with Assist; Fluid consistency: Nectar Thick  Diet effective now       Question Answer Comment  Fluid restriction: 1200 mL Fluid   Room service appropriate? Yes with Assist   Fluid consistency: Nectar Thick      02/01/24 1814            Objective: Vitals:   02/02/24 1220 02/02/24 1225 02/02/24 1230 02/02/24 1300  BP:      Pulse: 100 (!) 101 94 (!) 102  Resp: 18 19 (!) 21 (!)  23  Temp:      TempSrc:      SpO2: 96% 96% 96% (!) 88%  Weight:      Height:        Intake/Output Summary (Last 24 hours) at 02/02/2024 1415 Last data filed at 02/01/2024 2028 Gross per 24 hour  Intake 200 ml  Output --  Net 200 ml   Filed Weights   02/01/24 0904  Weight: 59 kg    Scheduled Meds:  calcium  acetate  667 mg Oral TID AC   carvedilol   3.125 mg Oral BID WC   Chlorhexidine   Gluconate Cloth  6 each Topical Q0600   folic acid   1 mg Oral Daily   guaiFENesin  1,200 mg Oral BID   hydroxychloroquine   200 mg Oral Daily   levothyroxine   25 mcg Oral Daily   pantoprazole   40 mg Oral BID AC   predniSONE   20 mg Oral Q breakfast   sodium chloride  flush  3 mL Intravenous Q12H   thiamine   100 mg Oral Daily   Continuous Infusions:  Nutritional status     Body mass index is 18.13 kg/m.  Data Reviewed:   CBC: Recent Labs  Lab 01/28/24 0304 01/29/24 0453 01/30/24 0144 01/31/24 0338 02/01/24 1217 02/01/24 1218  WBC 14.3* 12.5* 12.2* 13.6* 17.9*  --   NEUTROABS  --  12.3* 10.8* 11.9*  --   --   HGB 9.8* 9.7* 9.4* 9.5* 10.5* 11.2*  HCT 28.6* 28.4* 27.6* 28.8* 32.6* 33.0*  MCV 86.4 85.8 86.5 88.3 89.8  --   PLT 75* 77* 73* 62* 71*  --    Basic Metabolic Panel: Recent Labs  Lab 01/27/24 0252 01/29/24 0453 01/30/24 0144 01/31/24 0338 02/01/24 1217 02/01/24 1218  NA 131* 133* 132* 132* 129* 130*  K 4.3 3.1* 3.5 3.7 4.0 3.9  CL 92* 94* 96* 95* 91*  --   CO2 22 23 26 24 23   --   GLUCOSE 108* 87 99 88 87  --   BUN 63* 50* 32* 44* 60*  --   CREATININE 4.98* 4.38* 3.14* 3.92* 4.88*  --   CALCIUM  7.4* 7.4* 7.5* 7.5* 7.7*  --   MG  --  2.1 1.9 1.9 2.1  --   PHOS 4.5  --   --   --   --   --    GFR: Estimated Creatinine Clearance: 14.4 mL/min (A) (by C-G formula based on SCr of 4.88 mg/dL (H)). Liver Function Tests: Recent Labs  Lab 01/27/24 0252 01/29/24 0453 01/30/24 0144 01/31/24 0338 02/01/24 1217  AST  --  17 19 19 30   ALT  --  9 9 8 10   ALKPHOS  --  83 84 78 85  BILITOT  --  1.1 0.9 1.0 0.8  PROT  --  4.7* 4.8* 4.7* 5.3*  ALBUMIN  <1.5* 1.5* 1.5* <1.5* 1.7*   No results for input(s): LIPASE, AMYLASE in the last 168 hours. No results for input(s): AMMONIA in the last 168 hours. Coagulation Profile: No results for input(s): INR, PROTIME in the last 168 hours. Cardiac Enzymes: Recent Labs  Lab 02/01/24 1217  CKTOTAL 33*   BNP  (last 3 results) No results for input(s): PROBNP in the last 8760 hours. HbA1C: No results for input(s): HGBA1C in the last 72 hours. CBG: Recent Labs  Lab 02/01/24 1034  GLUCAP 82   Lipid Profile: No results for input(s): CHOL, HDL, LDLCALC, TRIG, CHOLHDL, LDLDIRECT in the last 72 hours. Thyroid  Function Tests:  No results for input(s): TSH, T4TOTAL, FREET4, T3FREE, THYROIDAB in the last 72 hours. Anemia Panel: No results for input(s): VITAMINB12, FOLATE, FERRITIN, TIBC, IRON, RETICCTPCT in the last 72 hours. Sepsis Labs: No results for input(s): PROCALCITON, LATICACIDVEN in the last 168 hours.  Recent Results (from the past 240 hours)  Resp panel by RT-PCR (RSV, Flu A&B, Covid) Anterior Nasal Swab     Status: Abnormal   Collection Time: 02/01/24 10:40 AM   Specimen: Anterior Nasal Swab  Result Value Ref Range Status   SARS Coronavirus 2 by RT PCR POSITIVE (A) NEGATIVE Final   Influenza A by PCR NEGATIVE NEGATIVE Final   Influenza B by PCR NEGATIVE NEGATIVE Final    Comment: (NOTE) The Xpert Xpress SARS-CoV-2/FLU/RSV plus assay is intended as an aid in the diagnosis of influenza from Nasopharyngeal swab specimens and should not be used as a sole basis for treatment. Nasal washings and aspirates are unacceptable for Xpert Xpress SARS-CoV-2/FLU/RSV testing.  Fact Sheet for Patients: BloggerCourse.com  Fact Sheet for Healthcare Providers: SeriousBroker.it  This test is not yet approved or cleared by the [place]  FDA and has been authorized for detection and/or diagnosis of SARS-CoV-2 by FDA under an Emergency Use Authorization (EUA). This EUA will remain in effect (meaning this test can be used) for the duration of the COVID-19 declaration under Section 564(b)(1) of the Act, 21 U.S.C. section 360bbb-3(b)(1), unless the authorization is terminated or revoked.     Resp Syncytial  Virus by PCR NEGATIVE NEGATIVE Final    Comment: (NOTE) Fact Sheet for Patients: BloggerCourse.com  Fact Sheet for Healthcare Providers: SeriousBroker.it  This test is not yet approved or cleared by the [place]  FDA and has been authorized for detection and/or diagnosis of SARS-CoV-2 by FDA under an Emergency Use Authorization (EUA). This EUA will remain in effect (meaning this test can be used) for the duration of the COVID-19 declaration under Section 564(b)(1) of the Act, 21 U.S.C. section 360bbb-3(b)(1), unless the authorization is terminated or revoked.  Performed at [facility], [address], [place], [place] [address]          Radiology Studies: CT ABDOMEN PELVIS WO CONTRAST Result Date: 02/01/2024 CLINICAL DATA:  Acute abdominal pain. EXAM: CT ABDOMEN AND PELVIS WITHOUT CONTRAST TECHNIQUE: Multidetector CT imaging of the abdomen and pelvis was performed following the standard protocol without IV contrast. RADIATION DOSE REDUCTION: This exam was performed according to the departmental dose-optimization program which includes automated exposure control, adjustment of the mA and/or kV according to patient size and/or use of iterative reconstruction technique. COMPARISON:  01/25/2024 FINDINGS: Lower chest: Again seen cardiomegaly. There is new nodular airspace disease within both lower lobes and right middle lobe. Trace left pleural effusion. Hepatobiliary: Small subcapsular fluid collection about the posterior right hepatic lobe, series 3, image 37, diminished from prior exam. Cirrhotic hepatic morphology again seen. No evidence of new liver abnormality on this unenhanced exam. Gallbladder physiologically distended, no calcified stone. No biliary dilatation. Pancreas: No ductal dilatation or inflammation. Spleen: Stable upper normal spleen size at 13.2 cm with punctate granuloma. Adrenals/Urinary Tract: No adrenal nodule.  Atrophic kidneys consistent with chronic renal disease. No hydronephrosis or renal calculi. Urinary bladder is decompressed. Stomach/Bowel: Bowel assessment is limited in the absence of enteric contrast. Air and fluid distention of the stomach with equivocal gastric wall thickening. No small bowel obstruction. Few prominent air-filled loops of small bowel are present in the upper abdomen. Near pan colonic wall thickening is new  from prior exam. Small volume of liquid stool in the proximal colon, with formed stool in the distal colon. Air-filled transverse colon. No bowel pneumatosis. Descending and sigmoid diverticulosis without diverticulitis. Vascular/Lymphatic: Aortic atherosclerosis. No aortic aneurysm. Stable small retroperitoneal lymph nodes. No evidence of progressive adenopathy. Reproductive: Prostate is unremarkable. Fluid collection extends into the right hemiscrotum as before. Other: There is a left-sided abdominal wall fluid collection, which may represent a large rectus sheath hematoma. This extends from the lower margin of the anterior ribs to the inguinal canal. Measurements are difficult due to irregular shape, however measures at least 26 cm cranial caudal, 7 cm AP and 15.8 cm transverse. Upper aspect is homogeneous, heterogeneous and serpiginous densities in the lower component. Unchanged appearance of organized loculated peritoneal fluid collection extending from the left abdomen, coarsely Ng obliquely into the right inguinal canal. This is slightly diminished in size from prior exam with the U shaped component in the abdominopelvic cavity tracking into the right pericolic gutter. A bilobed thick-walled component extends into the right inguinal canal and scrotum. There is no internal gas. Generalized body wall edema and edema of the intra-abdominal fat. Musculoskeletal: Chronic avascular necrosis of both femoral heads. No acute osseous findings. IMPRESSION: 1. New large left-sided abdominal wall  fluid collection, which may represent a rectus sheath hematoma. This extends from the lower margin of the anterior ribs to the inguinal canal measures at least 26 x 7 x 15.8 cm. Sterility is technically indeterminate. 2. Unchanged appearance of organized loculated peritoneal fluid collection extending from the left abdomen, coursing obliquely into the right inguinal canal. This has slightly diminished in size from prior exam. 3. Near pan colonic wall thickening is new from prior exam, suspicious for colitis. 4. New nodular airspace disease within both lower lobes and right middle lobe, typical of pneumonia. Trace left pleural effusion. 5. Cirrhosis with small subcapsular fluid collection about the posterior right hepatic lobe, diminished from prior exam. 6. Chronic avascular necrosis of both femoral heads. Aortic Atherosclerosis (ICD10-I70.0). Electronically Signed   By: Andrea Gasman M.D.   On: 02/01/2024 13:30   DG Chest Portable 1 View Result Date: 02/01/2024 CLINICAL DATA:  Weakness. EXAM: PORTABLE CHEST 1 VIEW COMPARISON:  December 14, 2023. FINDINGS: The heart size and mediastinal contours are within normal limits. Both lungs are clear. The visualized skeletal structures are unremarkable. IMPRESSION: No active disease. Electronically Signed   By: Lynwood Landy Raddle M.D.   On: 02/01/2024 11:38           LOS: 1 day   Time spent= 35 mins    Burgess JAYSON Dare, MD [facility]  If 7PM-7AM, please contact night-coverage  02/02/2024, 2:15 PM

## 2024-02-02 NOTE — Progress Notes (Signed)
 Pt receives out-pt HD at [facility] on TTS 11:00 am chair time. Will assist as needed.   Randine Mungo Dialysis Navigator [phone]

## 2024-02-03 DIAGNOSIS — R531 Weakness: Secondary | ICD-10-CM | POA: Diagnosis not present

## 2024-02-03 LAB — CBC
HCT: 27.6 % — ABNORMAL LOW (ref 39.0–52.0)
Hemoglobin: 9.1 g/dL — ABNORMAL LOW (ref 13.0–17.0)
MCH: 29.4 pg (ref 26.0–34.0)
MCHC: 33 g/dL (ref 30.0–36.0)
MCV: 89 fL (ref 80.0–100.0)
Platelets: 45 K/uL — ABNORMAL LOW (ref 150–400)
RBC: 3.1 MIL/uL — ABNORMAL LOW (ref 4.22–5.81)
RDW: 16.6 % — ABNORMAL HIGH (ref 11.5–15.5)
WBC: 16.9 K/uL — ABNORMAL HIGH (ref 4.0–10.5)
nRBC: 0 % (ref 0.0–0.2)

## 2024-02-03 LAB — BASIC METABOLIC PANEL WITH GFR
Anion gap: 14 (ref 5–15)
BUN: 39 mg/dL — ABNORMAL HIGH (ref 6–20)
CO2: 25 mmol/L (ref 22–32)
Calcium: 7.7 mg/dL — ABNORMAL LOW (ref 8.9–10.3)
Chloride: 91 mmol/L — ABNORMAL LOW (ref 98–111)
Creatinine, Ser: 3.57 mg/dL — ABNORMAL HIGH (ref 0.61–1.24)
GFR, Estimated: 19 mL/min — ABNORMAL LOW (ref >60)
Glucose, Bld: 101 mg/dL — ABNORMAL HIGH (ref 70–99)
Potassium: 3.5 mmol/L (ref 3.5–5.1)
Sodium: 130 mmol/L — ABNORMAL LOW (ref 135–145)

## 2024-02-03 LAB — MAGNESIUM: Magnesium: 1.9 mg/dL (ref 1.7–2.4)

## 2024-02-03 LAB — MISC LABCORP TEST (SEND OUT): Labcorp test code: 81950

## 2024-02-03 LAB — GLUCOSE, CAPILLARY
Glucose-Capillary: 55 mg/dL — ABNORMAL LOW (ref 70–99)
Glucose-Capillary: 115 mg/dL — ABNORMAL HIGH (ref 70–99)

## 2024-02-03 MED ORDER — ACETAMINOPHEN 325 MG PO TABS
650.0000 mg | ORAL_TABLET | Freq: Once | ORAL | Status: AC
Start: 2024-02-03 — End: 2024-02-03
  Administered 2024-02-03: 650 mg via ORAL
  Filled 2024-02-03: qty 2

## 2024-02-03 MED ORDER — GLYCOPYRROLATE 1 MG PO TABS: 1.0000 mg | ORAL_TABLET | ORAL | Status: DC | PRN

## 2024-02-03 MED ORDER — HYDROMORPHONE HCL-NACL 50-0.9 MG/50ML-% IV SOLN
0.5000 mg/h | INTRAVENOUS | Status: DC
Start: 2024-02-03 — End: 2024-02-04
  Administered 2024-02-03: 0.5 mg/h via INTRAVENOUS
  Filled 2024-02-03: qty 50

## 2024-02-03 MED ORDER — SODIUM CHLORIDE 0.9% IV SOLUTION: Freq: Once | INTRAVENOUS | Status: DC

## 2024-02-03 MED ORDER — SODIUM CHLORIDE 0.9 % IV SOLN
INTRAVENOUS | Status: AC
Start: 2024-02-03 — End: 2024-02-04

## 2024-02-03 MED ORDER — DEXTROSE 50 % IV SOLN
12.5000 g | INTRAVENOUS | Status: DC | PRN
  Administered 2024-02-03: 12.5 g via INTRAVENOUS
  Filled 2024-02-03: qty 50

## 2024-02-03 MED ORDER — MORPHINE SULFATE (PF) 2 MG/ML IV SOLN: 1.0000 mg | INTRAVENOUS | Status: DC | PRN

## 2024-02-03 MED ORDER — GLYCOPYRROLATE 0.2 MG/ML IJ SOLN: 0.2000 mg | INTRAMUSCULAR | Status: DC | PRN

## 2024-02-03 MED ORDER — DIPHENHYDRAMINE HCL 25 MG PO CAPS
25.0000 mg | ORAL_CAPSULE | Freq: Once | ORAL | Status: AC
  Administered 2024-02-03: 25 mg via ORAL
  Filled 2024-02-03: qty 1

## 2024-02-03 MED ORDER — MORPHINE 100MG IN NS 100ML (1MG/ML) PREMIX INFUSION: 0.0000 mg/h | INTRAVENOUS | Status: DC

## 2024-02-03 MED ORDER — ACETAMINOPHEN 325 MG PO TABS: 650.0000 mg | ORAL_TABLET | Freq: Four times a day (QID) | ORAL | Status: DC | PRN

## 2024-02-03 MED ORDER — ALBUMIN HUMAN 25 % IV SOLN
50.0000 g | Freq: Once | INTRAVENOUS | Status: AC
  Administered 2024-02-03: 50 g via INTRAVENOUS
  Filled 2024-02-03: qty 200

## 2024-02-03 MED ORDER — MORPHINE BOLUS VIA INFUSION: 5.0000 mg | INTRAVENOUS | Status: DC | PRN

## 2024-02-03 MED ORDER — TRAMADOL HCL 50 MG PO TABS
25.0000 mg | ORAL_TABLET | Freq: Every day | ORAL | Status: DC | PRN
  Administered 2024-02-03: 25 mg via ORAL
  Filled 2024-02-03: qty 1

## 2024-02-03 MED ORDER — SODIUM CHLORIDE 0.9 % IV BOLUS
500.0000 mL | Freq: Once | INTRAVENOUS | Status: AC
  Administered 2024-02-03: 500 mL via INTRAVENOUS

## 2024-02-03 MED ORDER — POLYVINYL ALCOHOL 1.4 % OP SOLN: 1.0000 [drp] | Freq: Four times a day (QID) | OPHTHALMIC | Status: DC | PRN

## 2024-02-03 MED ORDER — ACETAMINOPHEN 650 MG RE SUPP: 650.0000 mg | Freq: Four times a day (QID) | RECTAL | Status: DC | PRN

## 2024-02-03 NOTE — Evaluation (Signed)
 Occupational Therapy Evaluation Patient Details Name: [name] MRN: [id number] DOB: [birth year] Today's Date: 02/03/2024   History of Present Illness   53 yo male presents to [facility] on 02/01/24 with c/o increased generalized weakness. Pt found to be COVID-19+. Of note- pt discharge from [facility] on 01/31/24 with admission due to GI Bleed with acute blood loss anemia and organized loculated peritoneal fluid  collection extending from the left abdomen, coursing obliquely into  the right inguinal canal. PMHx: Lupus, lupus nephritis, ESRD on HDSLE, ascites without diagnosis of cirrhosis, chronic HFrecEF (EF 55-60% in 11/2023; previously 35-40% in 12/2021     Clinical Impressions At baseline, pt is Mod I with ADLs and ambulates household distances Independently. Pt now presents with decreased significantly activity tolerance, generalized B UE weakness, decreased balance, pain affecting functional level, decreased cognition, and decreased safety and independence with functional tasks. Pt currently demonstrate ability to complete ADLs largely with Set up to Total assist, bed mobility with Total assist +2, and functional transfers with Total assist +2 with use of Stedy. Pt also with self-limiting behaviors and requiring max encouragement for minimal participation. However, pt has good rehab potential with increased participation. Pt will benefit from acute skilled OT services to address deficits and increase safety and independence with functional tasks. Post acute discharge, pt will benefit from intensive inpatient skilled rehab services < 3 hours per day to maximize rehab potential.      If plan is discharge home, recommend the following:   Two people to help with walking and/or transfers;A lot of help with bathing/dressing/bathroom;Assistance with cooking/housework;Assist for transportation;Help with stairs or ramp for entrance     Functional Status Assessment   Patient has had a recent decline in their  functional status and demonstrates the ability to make significant improvements in function in a reasonable and predictable amount of time.     Equipment Recommendations   Other (comment) (defer to next level of care)     Recommendations for Other Services         Precautions/Restrictions   Precautions Precautions: Fall Recall of Precautions/Restrictions: Impaired Restrictions Weight Bearing Restrictions Per Provider Order: No     Mobility Bed Mobility Overal bed mobility: Needs Assistance Bed Mobility: Supine to Sit, Sit to Supine     Supine to sit: Total assist, +2 for physical assistance, +2 for safety/equipment Sit to supine: Total assist, +2 for physical assistance, +2 for safety/equipment   General bed mobility comments: Pt able to offer some assist, but largely relying on therapists assist even with Max encouragement to participate    Transfers Overall transfer level: Needs assistance Equipment used: Ambulation equipment used Transfers: Sit to/from Stand, Bed to chair/wheelchair/BSC Sit to Stand: Total assist, +2 physical assistance, +2 safety/equipment           General transfer comment: Pt able to offer some assist, but largely relying on therapists assist even with Max encouragement to participate      Balance Overall balance assessment: Needs assistance Sitting-balance support: Single extremity supported, Feet supported, Bilateral upper extremity supported Sitting balance-Leahy Scale: Poor Sitting balance - Comments: Pt sitting in recliner with noted Right lateral lean upon arrival with pt unable to self-correct and hold position in midline without therapist assist Postural control: Right lateral lean Standing balance support: Bilateral upper extremity supported, During functional activity, Reliant on assistive device for balance Standing balance-Leahy Scale: Zero Standing balance comment: Pt required use of Stedy with B UE support and support of  therapist to maintain  standing balance                           ADL either performed or assessed with clinical judgement   ADL Overall ADL's : Needs assistance/impaired Eating/Feeding: Set up;Sitting   Grooming: Set up;Contact guard assist;Sitting   Upper Body Bathing: Moderate assistance;Maximal assistance;Sitting   Lower Body Bathing: Total assistance;Bed level   Upper Body Dressing : Moderate assistance;Maximal assistance;Sitting   Lower Body Dressing: Total assistance;Bed level   Toilet Transfer: Total assistance;+2 for physical assistance;+2 for safety/equipment;BSC/3in1 (use of Stedy) Statistician Details (indicate cue type and reason): simulated chair to bed Toileting- Clothing Manipulation and Hygiene: Total assistance;+2 for physical assistance;+2 for safety/equipment;Sit to/from stand         General ADL Comments: Pt with signficant self-limiting behaviors and requiring Max encouragement for minimal participation in session. Pt with decreased activity tolerance, fatiguing quickly during tasks     Vision Baseline Vision/History: 1 Wears glasses Ability to See in Adequate Light: 0 Adequate (with glasses donned) Patient Visual Report: No change from baseline Vision Assessment?: No apparent visual deficits (with glasses donned) Additional Comments: Vision [facility] for tasks assessed; not formally screened or evaluated     Perception         Praxis         Pertinent Vitals/Pain Pain Assessment Pain Assessment: Faces Faces Pain Scale: Hurts even more Pain Location: abdomen Pain Descriptors / Indicators: Discomfort, Grimacing, Guarding Pain Intervention(s): Limited activity within patient's tolerance, Monitored during session, Repositioned     Extremity/Trunk Assessment Upper Extremity Assessment Upper Extremity Assessment: Right hand dominant;Generalized weakness   Lower Extremity Assessment Lower Extremity Assessment: Defer to PT evaluation        Communication Communication Communication: No apparent difficulties   Cognition Arousal: Alert Behavior During Therapy: Anxious, WFL for tasks assessed/performed Cognition: Cognition impaired, No family/caregiver present to determine baseline     Awareness: Intellectual awareness intact, Online awareness intact (intermittent online awareness)   Attention impairment (select first level of impairment): Selective attention (easily internally distracted) Executive functioning impairment (select all impairments): Problem solving, Reasoning OT - Cognition Comments: Pt AAOx4 and presenting with signs of anxiety and self limiting behavior. Pt with significant self-limiting and poor insight into role of skilled rehab. Even with education, pt stating he can't do anything until he isn't feeling weak anymore.                 Following commands: Intact Following commands impaired: Follows one step commands with increased time, Only follows one step commands consistently     Cueing  General Comments   Cueing Techniques: Verbal cues;Visual cues  VSS   Exercises     Shoulder Instructions      Home Living Family/patient expects to be discharged to:: Private residence Living Arrangements: Alone Available Help at Discharge: Family;Available 24 hours/day (Per chart, pt's sister and aunt provide support) Type of Home: House Home Access: Stairs to enter [employer] of Steps: 3   Home Layout: Two level;Able to live on main level with bedroom/bathroom     Bathroom Shower/Tub: Producer, television/film/video: Standard Bathroom Accessibility: Yes How Accessible: Accessible via wheelchair Home Equipment: Rolling Walker (2 wheels);Shower seat;Wheelchair - power          Prior Functioning/Environment Prior Level of Function : Needs assist             Mobility Comments: At baseline, pt was able to walk short  household distances. ADLs Comments: At baseline, pt  is Mod I with bathing and dressing with a noted recent decline in function due to increasing weakness.    OT Problem List: Decreased strength;Decreased activity tolerance;Impaired balance (sitting and/or standing);Decreased cognition   OT Treatment/Interventions: Self-care/ADL training;Therapeutic exercise;Energy conservation;DME and/or AE instruction;Therapeutic activities;Cognitive remediation/compensation;Balance training;Patient/family education      OT Goals(Current goals can be found in the care plan section)   Acute Rehab OT Goals Patient Stated Goal: to not feel weak and not have pain OT Goal Formulation: With patient Time For Goal Achievement: 02/17/24 Potential to Achieve Goals: Fair ADL Goals Pt Will Perform Grooming: with supervision;sitting (sitting EOB with Fair balance for 3 or more minutes) Pt Will Perform Upper Body Bathing: with contact guard assist;sitting Pt Will Perform Upper Body Dressing: with contact guard assist;sitting Pt Will Perform Lower Body Dressing: with mod assist;sit to/from stand Pt Will Transfer to Toilet: with mod assist;bedside commode (step-pivot tranfer with least restrictive AD) Pt Will Perform Toileting - Clothing Manipulation and hygiene: with mod assist;sit to/from stand;sitting/lateral leans   OT Frequency:  Min 1X/week    Co-evaluation              AM-PAC OT 6 Clicks Daily Activity     Outcome Measure Help from another person eating meals?: A Little Help from another person taking care of personal grooming?: A Little Help from another person toileting, which includes using toliet, bedpan, or urinal?: Total Help from another person bathing (including washing, rinsing, drying)?: A Lot Help from another person to put on and taking off regular upper body clothing?: A Lot Help from another person to put on and taking off regular lower body clothing?: Total 6 Click Score: 12   End of Session Equipment Utilized During Treatment:  Other (comment) Laurent) Nurse Communication: Mobility status;Need for lift equipment  Activity Tolerance: Patient limited by fatigue;Other (comment) (Pt limited by anxiety; pt with self-limiting behaviors) Patient left: in bed;with call bell/phone within reach;with bed alarm set  OT Visit Diagnosis: Unsteadiness on feet (R26.81);Muscle weakness (generalized) (M62.81)                Time: 8992-8972 OT Time Calculation (min): 20 min Charges:  OT General Charges $OT Visit: 1 Visit OT Evaluation $OT Eval Moderate Complexity: 1 Mod  Margarie Rockey HERO., OTR/L, MA Acute Rehab [phone]   Margarie FORBES Horns 02/03/2024, 11:56 AM

## 2024-02-03 NOTE — TOC Initial Note (Signed)
 Transition of Care [facility]) - Initial/Assessment Note    Patient Details  Name: [name] MRN: [id number] Date of Birth: 08-14-1970  [facility]) CM/SW Contact:    [name], LCSW Phone Number: 02/03/2024, 3:18 PM  Clinical Narrative:                 Patient admitted from home with recent admission and home health set up as patient declined SNF. CSW following for SNF workup. Barrier includes COVID + status if patient is agreeable.     Barriers to Discharge: Continued Medical Work up   Patient Goals and CMS Choice            Expected Discharge Plan and Services In-house Referral: Clinical Social Work     Living arrangements for the past 2 months: Single Family Home                                      Prior Living Arrangements/Services Living arrangements for the past 2 months: Single Family Home Lives with:: Self Patient language and need for interpreter reviewed:: Yes        Need for Family Participation in Patient Care: Yes (Comment) Care giver support system in place?: Yes (comment) Current home services: DME Criminal Activity/Legal Involvement Pertinent to Current Situation/Hospitalization: No - Comment as needed  Activities of Daily Living   ADL Screening (condition at time of admission) Independently performs ADLs?: No Does the patient have a NEW difficulty with bathing/dressing/toileting/self-feeding that is expected to last >3 days?: Yes (Initiates electronic notice to provider for possible OT consult) Does the patient have a NEW difficulty with getting in/out of bed, walking, or climbing stairs that is expected to last >3 days?: Yes (Initiates electronic notice to provider for possible PT consult) Does the patient have a NEW difficulty with communication that is expected to last >3 days?: No Is the patient deaf or have difficulty hearing?: No Does the patient have difficulty seeing, even when wearing glasses/contacts?: No Does  the patient have difficulty concentrating, remembering, or making decisions?: No  Permission Sought/Granted                  Emotional Assessment Appearance:: Appears stated age     Orientation: : Oriented to Self, Oriented to Place, Oriented to  Time, Oriented to Situation Alcohol / Substance Use: Not Applicable Psych Involvement: No (comment)  Admission diagnosis:  SIRS (systemic inflammatory response syndrome) (HCC) [R65.10] Intra-abdominal fluid collection [R18.8] Generalized weakness [R53.1] COVID [U07.1] Patient Active Problem List   Diagnosis Date Noted   Generalized weakness 02/01/2024   Hypotension due to hypovolemia 01/25/2024   Anemia associated with acute blood loss 01/25/2024   End-stage renal disease on hemodialysis (HCC) 01/25/2024   Bright red blood per rectum 01/24/2024   History of ascites 01/24/2024   ABLA (acute blood loss anemia) 01/24/2024   Constipation 01/24/2024   Cirrhosis of liver with ascites (HCC) 12/31/2023   Spontaneous bacterial peritonitis (HCC) 12/14/2023   Hyperkalemia 12/14/2023   Pericardial effusion 12/01/2021   Ascites 10/01/2020   Disorder of kidney and ureter, unspecified 10/01/2020   Essential hypertension 10/01/2020   Inflammatory arthritis 10/01/2020   Lupus nephritis (HCC) 10/01/2020   Thrombocytopenia 10/01/2020   ESRD on dialysis (HCC) 07/03/2020   Weight loss 07/03/2020   Malnutrition of moderate degree 05/01/2020   Acquired hypothyroidism 11/27/2019   Cardiac murmur 11/24/2019  Chronic systolic CHF (congestive heart failure) (HCC) 11/06/2019   Pancytopenia (HCC) 02/06/2019   Alcohol use 02/06/2019   Elevated LFTs 02/06/2019   Vitamin D  deficiency 12/05/2018   False positive interferon-gamma release assay (IGRA) for tuberculosis 12/05/2018   Neurotic excoriations 11/01/2018   Polyarthralgia 06/11/2015   Systemic lupus erythematosus with lung involvement (HCC) 03/09/2015   PCP:  Berneta Elsie Sayre,  MD Pharmacy:   [facility] [address] [place] - [address] [place] [address] Phone: [phone] Fax: [phone]  [place] [facility] [address] [place] [place] [address] Phone: [phone] Fax: [phone]     Social Drivers of Health (SDOH) Social History: SDOH Screenings   Food Insecurity: No Food Insecurity (02/02/2024)  Housing: Low Risk  (02/02/2024)  Transportation Needs: No Transportation Needs (02/02/2024)  Utilities: Not At Risk (02/02/2024)  Depression (PHQ2-9): Low Risk  (01/02/2022)  Tobacco Use: Low Risk  (02/01/2024)   SDOH Interventions:     Readmission Risk Interventions    02/03/2024    3:17 PM 01/03/2024   11:13 AM  Readmission Risk Prevention Plan  Transportation Screening Complete Complete  HRI or Home Care Consult  Complete  Social Work Consult for Recovery Care Planning/Counseling  Complete  Palliative Care Screening  Not Applicable  Medication Review [employer]ographer) Complete Referral to Pharmacy  PCP or Specialist appointment within 3-5 days of discharge Complete   HRI or Home Care Consult Complete   SW Recovery Care/Counseling Consult Complete   Palliative Care Screening Complete   Skilled Nursing Facility Complete

## 2024-02-03 NOTE — Progress Notes (Signed)
 Shortly after evaluation  notified  By nursing about patient being hypotensive with a MAP in the low 70s and tachycardic in the 100 range This was not after morphine  he only received tramadol  Will bolus IV fluid 500 cc give albumin  50 and get a stat hemoglobin as he may be having a worsening rectus sheath hematoma if hemoglobin drops we would CT him noncontrast and discuss further with him and his family goals of care Palliative has already been consulted

## 2024-02-03 NOTE — Progress Notes (Signed)
 [facility] Progress Note   Subjective:   Seen in room.  Up in recliner but wants to get into the bed. Says he's in a lot of pain. He wants to continue dialysis for now.   Objective Vitals:   02/03/24 0130 02/03/24 0315 02/03/24 0400 02/03/24 0402  BP: 115/75 112/68 128/71   Pulse: (!) 111  (!) 104   Resp: (!) 21 (!) 23 (!) 21   Temp:  (!) 97.3 F (36.3 C)    TempSrc:  Oral    SpO2: 97%  92%   Weight:    68.8 kg  Height:       Physical Exam General: Chronically ill-appearing; frail Heart: S1 and S2; RRR Lungs: Clear anteriorly, breathing unlabored Abdomen: Distended Extremities: b/l 2-3+ pitting edema Dialysis Access: L RC AVF   Filed Weights   02/02/24 1715 02/02/24 2104 02/03/24 0402  Weight: 66.5 kg 65.6 kg 68.8 kg    Intake/Output Summary (Last 24 hours) at 02/03/2024 1058 Last data filed at 02/02/2024 2104 Gross per 24 hour  Intake 561.15 ml  Output 1100 ml  Net -538.85 ml    Additional Objective Labs: Basic Metabolic Panel: Recent Labs  Lab 02/01/24 1217 02/01/24 1218 02/02/24 1406 02/03/24 0257  NA 129* 130* 129* 130*  K 4.0 3.9 4.2 3.5  CL 91*  --  90* 91*  CO2 23  --  22 25  GLUCOSE 87  --  97 101*  BUN 60*  --  71* 39*  CREATININE 4.88*  --  5.43* 3.57*  CALCIUM  7.7*  --  7.7* 7.7*   Liver Function Tests: Recent Labs  Lab 01/31/24 0338 02/01/24 1217 02/02/24 1406  AST 19 30 27   ALT 8 10 9   ALKPHOS 78 85 85  BILITOT 1.0 0.8 0.7  PROT 4.7* 5.3* 5.1*  ALBUMIN  <1.5* 1.7* 1.6*   No results for input(s): LIPASE, AMYLASE in the last 168 hours. CBC: Recent Labs  Lab 01/29/24 0453 01/30/24 0144 01/31/24 0338 02/01/24 1217 02/01/24 1218 02/02/24 1406 02/03/24 0257  WBC 12.5* 12.2* 13.6* 17.9*  --  17.5* 16.9*  NEUTROABS 12.3* 10.8* 11.9*  --   --   --   --   HGB 9.7* 9.4* 9.5* 10.5* 11.2* 10.0* 9.1*  HCT 28.4* 27.6* 28.8* 32.6* 33.0* 30.8* 27.6*  MCV 85.8 86.5 88.3 89.8  --  89.0 89.0  PLT 77* 73* 62* 71*  --   50* 45*   Blood Culture    Component Value Date/Time   SDES BLOOD SITE NOT SPECIFIED 02/02/2024 1505   SPECREQUEST  02/02/2024 1505    BOTTLES DRAWN AEROBIC ONLY Blood Culture results may not be optimal due to an inadequate volume of blood received in culture bottles   CULT  02/02/2024 1505    NO GROWTH < 24 HOURS Performed at [facility], [address], [place], [place] [address]    REPTSTATUS PENDING 02/02/2024 1505    Cardiac Enzymes: Recent Labs  Lab 02/01/24 1217  CKTOTAL 33*   CBG: Recent Labs  Lab 02/01/24 1034  GLUCAP 82   Iron Studies: No results for input(s): IRON, TIBC, TRANSFERRIN, FERRITIN in the last 72 hours. Lab Results  Component Value Date   INR 1.1 02/02/2024   INR 1.0 01/24/2024   INR 1.2 12/30/2023   Studies/Results: CT ABDOMEN PELVIS WO CONTRAST Result Date: 02/01/2024 CLINICAL DATA:  Acute abdominal pain. EXAM: CT ABDOMEN AND PELVIS WITHOUT CONTRAST TECHNIQUE: Multidetector CT imaging of the abdomen and  pelvis was performed following the standard protocol without IV contrast. RADIATION DOSE REDUCTION: This exam was performed according to the departmental dose-optimization program which includes automated exposure control, adjustment of the mA and/or kV according to patient size and/or use of iterative reconstruction technique. COMPARISON:  01/25/2024 FINDINGS: Lower chest: Again seen cardiomegaly. There is new nodular airspace disease within both lower lobes and right middle lobe. Trace left pleural effusion. Hepatobiliary: Small subcapsular fluid collection about the posterior right hepatic lobe, series 3, image 37, diminished from prior exam. Cirrhotic hepatic morphology again seen. No evidence of new liver abnormality on this unenhanced exam. Gallbladder physiologically distended, no calcified stone. No biliary dilatation. Pancreas: No ductal dilatation or inflammation. Spleen: Stable upper normal spleen size at 13.2 cm with punctate  granuloma. Adrenals/Urinary Tract: No adrenal nodule. Atrophic kidneys consistent with chronic renal disease. No hydronephrosis or renal calculi. Urinary bladder is decompressed. Stomach/Bowel: Bowel assessment is limited in the absence of enteric contrast. Air and fluid distention of the stomach with equivocal gastric wall thickening. No small bowel obstruction. Few prominent air-filled loops of small bowel are present in the upper abdomen. Near pan colonic wall thickening is new from prior exam. Small volume of liquid stool in the proximal colon, with formed stool in the distal colon. Air-filled transverse colon. No bowel pneumatosis. Descending and sigmoid diverticulosis without diverticulitis. Vascular/Lymphatic: Aortic atherosclerosis. No aortic aneurysm. Stable small retroperitoneal lymph nodes. No evidence of progressive adenopathy. Reproductive: Prostate is unremarkable. Fluid collection extends into the right hemiscrotum as before. Other: There is a left-sided abdominal wall fluid collection, which may represent a large rectus sheath hematoma. This extends from the lower margin of the anterior ribs to the inguinal canal. Measurements are difficult due to irregular shape, however measures at least 26 cm cranial caudal, 7 cm AP and 15.8 cm transverse. Upper aspect is homogeneous, heterogeneous and serpiginous densities in the lower component. Unchanged appearance of organized loculated peritoneal fluid collection extending from the left abdomen, coarsely Ng obliquely into the right inguinal canal. This is slightly diminished in size from prior exam with the U shaped component in the abdominopelvic cavity tracking into the right pericolic gutter. A bilobed thick-walled component extends into the right inguinal canal and scrotum. There is no internal gas. Generalized body wall edema and edema of the intra-abdominal fat. Musculoskeletal: Chronic avascular necrosis of both femoral heads. No acute osseous  findings. IMPRESSION: 1. New large left-sided abdominal wall fluid collection, which may represent a rectus sheath hematoma. This extends from the lower margin of the anterior ribs to the inguinal canal measures at least 26 x 7 x 15.8 cm. Sterility is technically indeterminate. 2. Unchanged appearance of organized loculated peritoneal fluid collection extending from the left abdomen, coursing obliquely into the right inguinal canal. This has slightly diminished in size from prior exam. 3. Near pan colonic wall thickening is new from prior exam, suspicious for colitis. 4. New nodular airspace disease within both lower lobes and right middle lobe, typical of pneumonia. Trace left pleural effusion. 5. Cirrhosis with small subcapsular fluid collection about the posterior right hepatic lobe, diminished from prior exam. 6. Chronic avascular necrosis of both femoral heads. Aortic Atherosclerosis (ICD10-I70.0). Electronically Signed   By: Andrea Gasman M.D.   On: 02/01/2024 13:30    Medications:    calcium  acetate  667 mg Oral TID AC   carvedilol   3.125 mg Oral BID WC   Chlorhexidine  Gluconate Cloth  6 each Topical Q0600   folic acid   1  mg Oral Daily   guaiFENesin  1,200 mg Oral BID   hydroxychloroquine   200 mg Oral Daily   levothyroxine   25 mcg Oral Daily   pantoprazole   40 mg Oral BID AC   predniSONE   20 mg Oral Q breakfast   sodium chloride  flush  3 mL Intravenous Q12H   thiamine   100 mg Oral Daily    Dialysis Orders: TTS - AF 3:45hr, 400/A1.5, EDW 58kg, 2K/2Ca bath, AVF, no heparin  - Last HD 9/25, left at 65.7kg - up on fluids - Mircera 225mcg IV q 2 weeks - last given 9/16. Last Hgb 8.6 on 9/25 - Hectoral on hold  Assessment/Plan: Weakness/COVID-19 infection. Per primary  Recent admit with LGIB; s/p colonoscopy/EGD. On PPI.   ESRD -on HD TTS. HD off schedule 10/8. HD back on schedule today.  Anemia: - s/p multiple pRBCs last admit. Received Aranesp  200 on 9/30. Resume ESA when  appropriate.  Secondary hyperparathyroidism - CorrCa/Phos acceptable.  HTN/volume - Overloaded on exam but not in distress. Push UF with HD as tolerate SLE - On Plaquenil  and steroids.  Recurrent ascites - C/b recent peritonitis    Maisie Ronnald Acosta PA-C [facility] 02/03/2024,11:00 AM

## 2024-02-03 NOTE — Progress Notes (Signed)
 [facility]   ROUNDING   NOTE AKSHAJ BESANCON FMW:[id number]  DOB: [birth year]  DOA: 02/01/2024  PCP: Berneta Elsie Sayre, MD  02/03/2024,11:13 AM  LOS: 2 days    Code Status:  full     From:  home    53 year old bedbound male at baseline ESRD TTS Chronic ascites with Klebsiella SBP 11/2023 Lupus nephritis on Plaquenil  prednisone  HFpEF managed with dialysis HTN Hypothyroid Just discharged from hospital 10/6 after admission for lower GI bleed EGD grade D esophagitis no bleeding, colonoscopy single solitary ulcer and was recommended Protonix  twice daily with outpatient other medications Palliative care was consulted at that hospitalization and patient's sister who is a nurse was involved in management/care  Brought to with generalized weakness on 10/7Which usually responds if it is a lupus flare to IV opioids and steroids  was started empiric management prednisone  20, general surgery felt that the rectus sheath, which was new required no further management He remained on room air   Assessment  & Plan :    Likely spontaneous rectus sheath hematoma Moderate to severe pain he is quite tender in the left lower quadrant with erythema around that area he had requested prior physician to give him Robitussin to help expectorate more which may have caused a spontaneous worsening of his hematoma Resumed his home tramadol  has not received that yet, will add morphine  IV 1 mg Q2 as needed IV for severe pain given the tenderness in his abdomen I will give him 1 unit of platelets as his platelet count is below 50 his hemoglobin however is moderately stable If he drops hemoglobin again in the morning with severe pain we may need to have discussions with him and family about goals versus discussion with general surgery although typically these tamponade Asymptomatic COVID No further treatment beyond steroids ESRD TTS Elevated BNP probably from volume overload/ascites PhosLo 667 3 times daily-routine dialysis as per  renal Cirrhosis/chronic ascites He tells me he gets almost weekly paracenteses--- the last 1 I see in the chart by IR is 01/03/2024 His sister categorically tells me he does not have cirrhosis and that this may be related to just fluid in the abdomen Lupus nephritis Plaquenil  prednisone  Maintained at home on prednisone  20 which has been resumed here he continues his Plaquenil  200 daily Hypothyroidism- Continue levothyroxine  25 daily Encounter for palliative care May need to have further discussions about goals of care   Spoke with his sister and updated     Data Reviewed:   Sodium 130 potassium 3.5 BUN/creatinine 39/3.5 magnesium  1.9 WBC 16.9 hemoglobin 9.1 platelet 45 Blood culture X2 pending  DVT prophylaxis: SCD  Status is: Inpatient Inpatient     Current Dispo: Likely home need to discuss with palliative care     Subjective:    Awake in pain and main focus is pain States mainly in abdomen Eating some  Objective + exam Vitals:   02/03/24 0130 02/03/24 0315 02/03/24 0400 02/03/24 0402  BP: 115/75 112/68 128/71   Pulse: (!) 111  (!) 104   Resp: (!) 21 (!) 23 (!) 21   Temp:  (!) 97.3 F (36.3 C)    TempSrc:  Oral    SpO2: 97%  92%   Weight:    68.8 kg  Height:       Filed Weights   02/02/24 1715 02/02/24 2104 02/03/24 0402  Weight: 66.5 kg 65.6 kg 68.8 kg     Examination:    Eomi ncat no focal deficit wasting ++  Abd distended has significant tenderness in the LLQ with swelling No LE edema  Neurologically intact no asterixis Cannot appreciate shifting dullness  Scheduled Meds:  calcium  acetate  667 mg Oral TID AC   carvedilol   3.125 mg Oral BID WC   Chlorhexidine  Gluconate Cloth  6 each Topical Q0600   folic acid   1 mg Oral Daily   guaiFENesin  1,200 mg Oral BID   hydroxychloroquine   200 mg Oral Daily   levothyroxine   25 mcg Oral Daily   pantoprazole   40 mg Oral BID AC   predniSONE   20 mg Oral Q breakfast   sodium chloride  flush  3 mL  Intravenous Q12H   thiamine   100 mg Oral Daily   Continuous Infusions:  Time 45  Jai-Gurmukh Tashaya Ancrum, MD  [facility]

## 2024-02-03 NOTE — IPAL (Signed)
  Interdisciplinary Goals of Care Family Meeting   Date carried out: 02/03/2024  Location of the meeting: Bedside  Member's involved: Physician, Bedside Registered Nurse, and Family Member or next of kin  Durable Power of Attorney or acting medical decision maker: Sister  Discussion: We discussed goals of care for [name] .    I discussed his downward trajectory today and his poor responsivity this afternoon with low maps despite boluses of fluid, albumin  being given 50 mg We had a robust discussion about Paxlovid and the fact that this ihas lack of evidence and also because he is already on immunosuppressants the utility of this medication may not have produced any beneficial effect even if we could use it because patient did not meet criterion. I explained clearly to the patient's sister that I feel he is having cardiorespiratory embarrassment instead from splinting of his abdomen secondary to an enlarging rectus sheath hematoma Patient refused blood draws x 3 since morning when I was trying to ascertain whether he dropped his hemoglobin in order to transfuse him Note that we gave him some platelets earlier today Patient sister confirms that he is full code and then wanted to speak to him in person I facilitated that went into the room with the phone and nursing staff-patient confirmed that he does not want to be resuscitated and he does not want to be placed in an ICU I updated the code directives Sister  is asking to see if patient can come home with her today at home-I have mentioned that it may be hours given his clinical trajectory but we will attempt to get case management involved to offer choice of hospice  Code status:   Code Status: Full Code   Disposition: Home with Hospice if possible but likely will die here overnight  Time spent for the meeting: 40    Angel Grimes, MD  02/03/2024, 5:02 PM

## 2024-02-03 NOTE — Evaluation (Signed)
 Physical Therapy Evaluation Patient Details Name: [name] MRN: [id number] DOB: Sep 05, 1970 Today's Date: 02/03/2024  History of Present Illness  53 yo male presents to [facility] on 02/01/24 with c/o increased generalized weakness. Pt found to be COVID-19+. Of note- pt discharge from [facility] on 01/31/24 with admission due to GI Bleed with acute blood loss anemia and organized loculated peritoneal fluid  collection extending from the left abdomen, coursing obliquely into  the right inguinal canal. PMHx: Lupus, lupus nephritis, ESRD on HDSLE, ascites without diagnosis of cirrhosis, chronic HFrecEF (EF 55-60% in 11/2023; previously 35-40% in 12/2021  Clinical Impression  Pt presents with admitting diagnosis above. Co-treat with OT. Pt today was received in chair screaming out in pain requesting therapists to immediately return him to bed. Pt was able to transfer back to bed via stedy with +2 total A however was reporting 15/10 abdominal pain. Patient will benefit from continued inpatient follow up therapy, <3 hours/day. PT will continue to follow.         If plan is discharge home, recommend the following: A lot of help with walking and/or transfers;A lot of help with bathing/dressing/bathroom;Assistance with cooking/housework;Assist for transportation;Help with stairs or ramp for entrance   Can travel by private vehicle   No    Equipment Recommendations None recommended by PT  Recommendations for Other Services       Functional Status Assessment Patient has had a recent decline in their functional status and demonstrates the ability to make significant improvements in function in a reasonable and predictable amount of time.     Precautions / Restrictions Precautions Precautions: Fall Recall of Precautions/Restrictions: Impaired Restrictions Weight Bearing Restrictions Per Provider Order: No      Mobility  Bed Mobility Overal bed mobility: Needs Assistance Bed Mobility: Sit to Supine        Sit to supine: Total assist, +2 for physical assistance, +2 for safety/equipment   General bed mobility comments: Pt able to offer some assist, but largely relying on therapists assist even with Max encouragement to participate    Transfers Overall transfer level: Needs assistance Equipment used: Ambulation equipment used Transfers: Sit to/from Stand, Bed to chair/wheelchair/BSC Sit to Stand: Total assist, +2 physical assistance, +2 safety/equipment           General transfer comment: Pt able to offer some assist, but largely relying on therapists assist even with Max encouragement to participate Transfer via Lift Equipment: Stedy  Ambulation/Gait               General Gait Details: Unable  Stairs            Wheelchair Mobility     Tilt Bed    Modified Rankin (Stroke Patients Only)       Balance Overall balance assessment: Needs assistance Sitting-balance support: Single extremity supported, Feet supported, Bilateral upper extremity supported Sitting balance-Leahy Scale: Poor Sitting balance - Comments: Pt sitting in recliner with noted Right lateral lean upon arrival with pt unable to self-correct and hold position in midline without therapist assist Postural control: Right lateral lean Standing balance support: Bilateral upper extremity supported, During functional activity, Reliant on assistive device for balance Standing balance-Leahy Scale: Zero Standing balance comment: Pt required use of Stedy with B UE support and support of therapist to maintain standing balance                             Pertinent Vitals/Pain Pain Assessment  Pain Assessment: 0-10 Pain Score: 10-Worst pain ever Pain Location: abdomen Pain Descriptors / Indicators: Discomfort, Grimacing, Guarding, Moaning Pain Intervention(s): Limited activity within patient's tolerance, Monitored during session, Repositioned    Home Living Family/patient expects to be discharged  to:: Private residence Living Arrangements: Alone Available Help at Discharge: Family;Available 24 hours/day (Per chart, pt's sister and aunt provide support) Type of Home: House Home Access: Stairs to enter   [employer] of Steps: 3   Home Layout: Two level;Able to live on main level with bedroom/bathroom Home Equipment: Rolling Walker (2 wheels);Shower seat;Wheelchair - power      Prior Function Prior Level of Function : Needs assist             Mobility Comments: At baseline, pt was able to walk short household distances. ADLs Comments: At baseline, pt is Mod I with bathing and dressing with a noted recent decline in function due to increasing weakness.     Extremity/Trunk Assessment   Upper Extremity Assessment Upper Extremity Assessment: Defer to OT evaluation    Lower Extremity Assessment Lower Extremity Assessment: Generalized weakness       Communication   Communication Communication: No apparent difficulties    Cognition Arousal: Alert Behavior During Therapy: Anxious, WFL for tasks assessed/performed                             Following commands: Intact Following commands impaired: Follows one step commands with increased time, Only follows one step commands consistently     Cueing Cueing Techniques: Verbal cues, Visual cues     General Comments General comments (skin integrity, edema, etc.): VSS    Exercises     Assessment/Plan    PT Assessment Patient needs continued PT services  PT Problem List Decreased strength;Decreased coordination;Decreased range of motion;Decreased cognition;Decreased knowledge of use of DME;Decreased activity tolerance;Decreased safety awareness;Decreased balance;Decreased mobility       PT Treatment Interventions DME instruction;Balance training;Gait training;Neuromuscular re-education;Stair training;Functional mobility training;Therapeutic activities;Therapeutic exercise;Patient/family  education;Cognitive remediation;Manual techniques    PT Goals (Current goals can be found in the Care Plan section)  Acute Rehab PT Goals Patient Stated Goal: to have less pain when moving PT Goal Formulation: With patient Time For Goal Achievement: 02/17/24 Potential to Achieve Goals: Fair    Frequency Min 2X/week     Co-evaluation               AM-PAC PT 6 Clicks Mobility  Outcome Measure Help needed turning from your back to your side while in a flat bed without using bedrails?: A Lot Help needed moving from lying on your back to sitting on the side of a flat bed without using bedrails?: A Lot Help needed moving to and from a bed to a chair (including a wheelchair)?: Total Help needed standing up from a chair using your arms (e.g., wheelchair or bedside chair)?: Total Help needed to walk in hospital room?: Total Help needed climbing 3-5 steps with a railing? : Total 6 Click Score: 8    End of Session Equipment Utilized During Treatment: Gait belt Activity Tolerance: Patient limited by pain Patient left: in bed;with call bell/phone within reach;with bed alarm set Nurse Communication: Mobility status PT Visit Diagnosis: Unsteadiness on feet (R26.81);Other abnormalities of gait and mobility (R26.89);Difficulty in walking, not elsewhere classified (R26.2);Muscle weakness (generalized) (M62.81)    Time: 8992-8972 PT Time Calculation (min) (ACUTE ONLY): 20 min   Charges:   PT Evaluation $PT  Eval Moderate Complexity: 1 Mod   PT General Charges $$ ACUTE PT VISIT: 1 Visit         Sueellen NOVAK, PT, DPT Acute Rehab Services [phone]   Avelina Mcclurkin 02/03/2024, 2:47 PM

## 2024-02-03 NOTE — Significant Event (Signed)
 Rapid Response Event Note   Reason for Call :  Hypotension, 75/61 (66)  Initial Focused Assessment:  Pt lying in bed, eyes open. Oriented to situation, withdrawn. Round, distended abdomen. Clear breath sounds. Murmur. Skin warm, dry, pale.   VS: T 97.41F, BP 95/52 (63), HR 105, RR 21, SpO2 100% on room air CBG: 55  Interventions:  -12.5g IV Dextrose  for CBG 55  Plan of Care:  -Patient with poor oral intake. IV dextrose  for CBG of 55. Recheck CBG 15 minutes following intervention.  -IVF bolus and Albumin  running- BP 90/62 (70)  Call rapid response for additional needs  Event Summary:  MD Notified: Dr. Royal, at bedside Call Time: 1551 Arrival Time: 1555 End Time: 1410  Leonor LITTIE Danker, RN

## 2024-02-04 ENCOUNTER — Other Ambulatory Visit (HOSPITAL_COMMUNITY): Payer: Self-pay

## 2024-02-04 DIAGNOSIS — R531 Weakness: Secondary | ICD-10-CM | POA: Diagnosis not present

## 2024-02-04 LAB — PREPARE PLATELET PHERESIS: Unit division: 0

## 2024-02-04 LAB — BPAM PLATELET PHERESIS
Blood Product Expiration Date: 202510112359
ISSUE DATE / TIME: 202510091311
Unit Type and Rh: 5100

## 2024-02-04 MED ORDER — HYDROMORPHONE HCL 1 MG/ML PO LIQD
2.0000 mg | ORAL | 0 refills | Status: DC
  Filled 2024-02-04: qty 56, 3d supply, fill #0

## 2024-02-04 MED ORDER — OXYCODONE HCL 5 MG/5ML PO SOLN
7.5000 mg | ORAL | 0 refills | Status: AC | PRN
  Filled 2024-02-04: qty 30, 1d supply, fill #0

## 2024-02-04 MED ORDER — SCOPOLAMINE 1 MG/3DAYS TD PT72
1.0000 | MEDICATED_PATCH | TRANSDERMAL | 12 refills | Status: AC
  Filled 2024-02-04: qty 10, 30d supply, fill #0

## 2024-02-04 NOTE — Discharge Summary (Addendum)
 Physician Discharge Summary  [name] FMW:[id number] DOB: Mar 11, 1971 DOA: 02/01/2024  PCP: Berneta Elsie Sayre, MD  Admit date: 02/01/2024 Discharge date: 02/04/2024  Time spent: 20 minutes  Recommendations for Outpatient Follow-up:  Patient discharging home with hospice and full hospice support  Discharge Diagnoses:  MAIN problem for hospitalization   Large rectus sheath hematoma with likely expansion resulting in decompensated cirrhosis and cardiorespiratory embarrassment COVID  Please see below for itemized issues addressed in HOpsital- refer to other progress notes for clarity if needed  Discharge Condition: Very guarded  Diet recommendation: Comfort  Filed Weights   02/02/24 1715 02/02/24 2104 02/03/24 0402  Weight: 66.5 kg 65.6 kg 68.8 kg    History of present illness:  53 year old bedbound male at baseline ESRD TTS Chronic ascites with Klebsiella SBP 11/2023 Lupus nephritis on Plaquenil  prednisone  HFpEF managed with dialysis HTN Hypothyroid Just discharged from hospital 10/6 after admission for lower GI bleed EGD grade D esophagitis no bleeding, colonoscopy single solitary ulcer and was recommended Protonix  twice daily with outpatient other medications Palliative care was consulted at that hospitalization and patient's sister who is a nurse was involved in management/care   Brought to with generalized weakness on 10/7 and found to have COVID which was relatively asymptomatic other than for cough without infiltrates on chest x-ray no systemic signs or symptoms of hemodynamic compromise nor any objective leukocytosis  was started empiric management prednisone  20, general surgery felt that the rectus sheath, which was new required no further management-he was seen started on symptomatic management for COVID and maintained on his immunosuppressants inclusive of prednisone  He remained on room air  It was felt by general surgery that his rectus sheath hematoma was  spontaneous secondary to excessive coughing and he was quite tender on exam in the abdomen I saw him for 1 day on 10/9 early in the morning and then subsequently later on on 10/9 he started to have relatively severe hypotension and became more lethargic Detailed discussions were conducted with his sister who is a nurse and the patient eventually was discussed in a holistic fashion with regards to his end-stage disease process in addition to his severe comorbidities of lupus and ESRD Decision was made by family after discussion with patient who is still coherent at the time that he did wish to be comfort care as opposed to his prior decisions just earlier this month when he indicated he wanted to be full code He was started on comfort medications and pursued the comfort trajectory I evaluated him on 10/10 and he looked ill however family was quite interested in taking him home to spend the last several hours with them I have placed him on comfort medications inclusive of Opiates scopolamine patch sublingually and hospice agency will meet him and his family at home and facilitate end-of-life transition His prognosis is extremely guarded  Discharge Exam: Vitals:   02/04/24 0000 02/04/24 1130  BP: (!) 65/44 (!) 74/47  Pulse: 96 (!) 107  Resp: 10 13  Temp:    SpO2: 100% 100%    Subj on day of d/c   moribund appearing white male bitemporal supraclavicular wasting S1-S2 tachycardic Extremities are swollen he has heel protectors on Abdomen is distended it is quite tender in the entire left hemiabdomen He is incoherent and relatively unresponsive  Discharge Instructions   Discharge Instructions     Diet - low sodium heart healthy   Complete by: As directed    Increase activity slowly   Complete by:  As directed       Allergies as of 02/04/2024   No Known Allergies      Medication List     STOP taking these medications    Calcium  Acetate 667 MG Tabs   carvedilol  6.25 MG  tablet Commonly known as: COREG    ciprofloxacin 500 MG tablet Commonly known as: CIPRO   folic acid  1 MG tablet Commonly known as: FOLVITE    hydroxychloroquine  200 MG tablet Commonly known as: PLAQUENIL    levothyroxine  25 MCG tablet Commonly known as: SYNTHROID    MIRCERA IJ   multivitamin Tabs tablet   ondansetron  4 MG disintegrating tablet Commonly known as: ZOFRAN -ODT   pantoprazole  40 MG tablet Commonly known as: PROTONIX    polyethylene glycol powder 17 GM/SCOOP powder Commonly known as: MiraLax    predniSONE  10 MG tablet Commonly known as: DELTASONE    thiamine  100 MG tablet Commonly known as: VITAMIN B1   traMADol  50 MG tablet Commonly known as: ULTRAM        TAKE these medications    oxyCODONE  5 MG/5ML solution Commonly known as: ROXICODONE  Take 7.5 mLs (7.5 mg total) by mouth every 4 (four) hours as needed for severe pain (pain score 7-10).   scopolamine 1 MG/3DAYS Commonly known as: TRANSDERM-SCOP Place 1 patch (1 mg total) onto the skin every 3 (three) days.       No Known Allergies    The results of significant diagnostics from this hospitalization (including imaging, microbiology, ancillary and laboratory) are listed below for reference.    Significant Diagnostic Studies: CT ABDOMEN PELVIS WO CONTRAST Result Date: 02/01/2024 CLINICAL DATA:  Acute abdominal pain. EXAM: CT ABDOMEN AND PELVIS WITHOUT CONTRAST TECHNIQUE: Multidetector CT imaging of the abdomen and pelvis was performed following the standard protocol without IV contrast. RADIATION DOSE REDUCTION: This exam was performed according to the departmental dose-optimization program which includes automated exposure control, adjustment of the mA and/or kV according to patient size and/or use of iterative reconstruction technique. COMPARISON:  01/25/2024 FINDINGS: Lower chest: Again seen cardiomegaly. There is new nodular airspace disease within both lower lobes and right middle lobe. Trace left  pleural effusion. Hepatobiliary: Small subcapsular fluid collection about the posterior right hepatic lobe, series 3, image 37, diminished from prior exam. Cirrhotic hepatic morphology again seen. No evidence of new liver abnormality on this unenhanced exam. Gallbladder physiologically distended, no calcified stone. No biliary dilatation. Pancreas: No ductal dilatation or inflammation. Spleen: Stable upper normal spleen size at 13.2 cm with punctate granuloma. Adrenals/Urinary Tract: No adrenal nodule. Atrophic kidneys consistent with chronic renal disease. No hydronephrosis or renal calculi. Urinary bladder is decompressed. Stomach/Bowel: Bowel assessment is limited in the absence of enteric contrast. Air and fluid distention of the stomach with equivocal gastric wall thickening. No small bowel obstruction. Few prominent air-filled loops of small bowel are present in the upper abdomen. Near pan colonic wall thickening is new from prior exam. Small volume of liquid stool in the proximal colon, with formed stool in the distal colon. Air-filled transverse colon. No bowel pneumatosis. Descending and sigmoid diverticulosis without diverticulitis. Vascular/Lymphatic: Aortic atherosclerosis. No aortic aneurysm. Stable small retroperitoneal lymph nodes. No evidence of progressive adenopathy. Reproductive: Prostate is unremarkable. Fluid collection extends into the right hemiscrotum as before. Other: There is a left-sided abdominal wall fluid collection, which may represent a large rectus sheath hematoma. This extends from the lower margin of the anterior ribs to the inguinal canal. Measurements are difficult due to irregular shape, however measures at least 26 cm cranial caudal,  7 cm AP and 15.8 cm transverse. Upper aspect is homogeneous, heterogeneous and serpiginous densities in the lower component. Unchanged appearance of organized loculated peritoneal fluid collection extending from the left abdomen, coarsely Ng  obliquely into the right inguinal canal. This is slightly diminished in size from prior exam with the U shaped component in the abdominopelvic cavity tracking into the right pericolic gutter. A bilobed thick-walled component extends into the right inguinal canal and scrotum. There is no internal gas. Generalized body wall edema and edema of the intra-abdominal fat. Musculoskeletal: Chronic avascular necrosis of both femoral heads. No acute osseous findings. IMPRESSION: 1. New large left-sided abdominal wall fluid collection, which may represent a rectus sheath hematoma. This extends from the lower margin of the anterior ribs to the inguinal canal measures at least 26 x 7 x 15.8 cm. Sterility is technically indeterminate. 2. Unchanged appearance of organized loculated peritoneal fluid collection extending from the left abdomen, coursing obliquely into the right inguinal canal. This has slightly diminished in size from prior exam. 3. Near pan colonic wall thickening is new from prior exam, suspicious for colitis. 4. New nodular airspace disease within both lower lobes and right middle lobe, typical of pneumonia. Trace left pleural effusion. 5. Cirrhosis with small subcapsular fluid collection about the posterior right hepatic lobe, diminished from prior exam. 6. Chronic avascular necrosis of both femoral heads. Aortic Atherosclerosis (ICD10-I70.0). Electronically Signed   By: Andrea Gasman M.D.   On: 02/01/2024 13:30   DG Chest Portable 1 View Result Date: 02/01/2024 CLINICAL DATA:  Weakness. EXAM: PORTABLE CHEST 1 VIEW COMPARISON:  December 14, 2023. FINDINGS: The heart size and mediastinal contours are within normal limits. Both lungs are clear. The visualized skeletal structures are unremarkable. IMPRESSION: No active disease. Electronically Signed   By: Lynwood Landy Raddle M.D.   On: 02/01/2024 11:38   US  ASCITES (ABDOMEN LIMITED) Result Date: 01/29/2024 CLINICAL DATA:  Patient with a history of ESRD with  recurrent ascites. Interventional Radiology asked to evaluate patient for a diagnostic and therapeutic paracentesis. EXAM: LIMITED ABDOMEN ULTRASOUND FOR ASCITES TECHNIQUE: Limited ultrasound survey for ascites was performed in all four abdominal quadrants. COMPARISON:  None Available. FINDINGS: Limited ultrasound evaluation of the abdomen revealed small, scattered pockets of fluid and large, dilated loops of bowel. No safe percutaneous window for paracentesis. No procedure performed. IMPRESSION: Small volume ascites and large, dilated loops of bowel identified. Fluid volume insufficient to safely perform procedure. Ultrasound images reviewed by: Warren Dais, NP Electronically Signed   By: Cordella Banner   On: 01/29/2024 13:06   CT ANGIO GI BLEED Result Date: 01/25/2024 EXAM: CTA ABDOMEN AND PELVIS WITH CONTRAST 01/25/2024 07:35:00 PM TECHNIQUE: CTA images of the abdomen and pelvis with and without intravenous contrast. Three-dimensional MIP/volume rendered formations were performed. Automated exposure control, iterative reconstruction, and/or weight based adjustment of the mA/kV was utilized to reduce the radiation dose to as low as reasonably achievable. COMPARISON: CT abdomen and pelvis 02/29/2025. CLINICAL HISTORY: Aneurysm, renal or visceral; lower GI bleed-please assess for active extravasation. CT ANGIO GI BLEED; Aneurysm, renal or visceral, lower GI bleed-please assess for active extravasation. Omni 350 70mL. FINDINGS: VASCULATURE: GI BLEED: No evidence of active GI bleeding. AORTA: No acute finding. No abdominal aortic aneurysm. No dissection. CELIAC TRUNK: No acute finding. No occlusion or significant stenosis. SUPERIOR MESENTERIC ARTERY: No acute finding. No occlusion or significant stenosis. INFERIOR MESENTERIC ARTERY: No acute finding. No occlusion or significant stenosis. RENAL ARTERIES: No acute finding. No occlusion or  significant stenosis. ILIAC ARTERIES: No acute finding. No occlusion or  significant stenosis. ABDOMEN/PELVIS: LOWER CHEST: Visualized portion of the lower chest demonstrates no acute abnormality. LIVER: Cirrhotic liver morphology. Unchanged small subcapsular fluid collection along the posterior right hepatic lobe; there is again a tenuous connection with the large peritoneal fluid collection described below. GALLBLADDER AND BILE DUCTS: Gallbladder is unremarkable. No biliary ductal dilatation. SPLEEN: The spleen is unremarkable. PANCREAS: The pancreas is unremarkable. ADRENAL GLANDS: Bilateral adrenal glands demonstrate no acute abnormality. KIDNEYS, URETERS AND BLADDER: Bilateral cortical renal atrophy. Chronic nonspecific perinephric stranding. Bladder wall thickening. No stones in the kidneys or ureters. No hydronephrosis. GI AND BOWEL: Mild diffuse gastric wall thickening. Large colonic stool burden. Additional wall thickening of the proximal jejunum near the ligament of Treitz. Wall thickening of the rectum with fat stranding and edema. Extensive colonic diverticulosis. There is no bowel obstruction. No evidence of active bleeding. REPRODUCTIVE: Scrotal edema. PERITONEUM AND RETROPERITONEUM: Thick-walled organized collection extending from the inferior surface of the liver into the low anterior abdomen and right inguinal canal/scrotum is unchanged. Similar mass effect on the adjacent small bowel and mesentery. Mesenteric and omental edema. Similar free fluid in the abdomen and pelvis. No free intraperitoneal air. LYMPH NODES: Similar shotty retroperitoneal lymph nodes. BONES AND SOFT TISSUES: Avascular necrosis of both femoral heads. Body wall anasarca and scrotal edema. No acute abnormality of the bones. No acute soft tissue abnormality. IMPRESSION: 1. No active gastrointestinal bleeding. 2. Wall thickening of the stomach, jejunum near the ligament of treitz, and rectum are favored infectious/inflammatory though portal congestive Gastropathy/enteropathy/colopathy could appear  similarly. 3. Extensive colonic diverticulosis with large colonic stool burden, without evidence of diverticulitis. 4. Bladder wall thickening and nonspecific perinephric stranding. Correlate for cystitis/pyelonephritis. Electronically signed by: Norman Gatlin MD 01/25/2024 08:11 PM EDT RP Workstation: [id number]   CT ABDOMEN PELVIS WO CONTRAST Result Date: 01/24/2024 CLINICAL DATA:  53 year old male with left lower quadrant abdominal pain. History of lupus, end-stage renal disease, bacterial peritonitis, recurrent ascites. Cirrhosis suspected by imaging recently. EXAM: CT ABDOMEN AND PELVIS WITHOUT CONTRAST TECHNIQUE: Multidetector CT imaging of the abdomen and pelvis was performed following the standard protocol without IV contrast. RADIATION DOSE REDUCTION: This exam was performed according to the departmental dose-optimization program which includes automated exposure control, adjustment of the mA and/or kV according to patient size and/or use of iterative reconstruction technique. COMPARISON:  CT Abdomen and Pelvis with contrast 12/25/2023 and earlier. FINDINGS: Lower chest: Cardiomegaly. No pericardial effusion. No pleural effusion. Negative lung bases. Hepatobiliary: The gallbladder is more distended now. No convincing acute pericholecystic inflammation. Noncontrast liver appears stable, with a small subcapsular fluid collection along the posterior right hepatic lobe (series 3, image 30) stable to slightly smaller. And that collection appears to have a tenuous communication with a larger peritoneal fluid collection which is described below (series 3, image 44). Pancreas: Negative noncontrast appearance. Spleen: Stable spleen size, upper limits of normal. Punctate calcified splenic granulomas. Adrenals/Urinary Tract: Negative adrenal glands. Moderate bilateral renal atrophy. Nonobstructed kidneys. Chronic pararenal space edema/stranding. Diminutive but thick-walled urinary bladder. Stomach/Bowel:  Organized, loculated appearing peritoneal fluid collection now occupying the left abdomen, tracking across midline at the pelvic inlet, and continuing into the right inguinal canal. The largest component of this collection encompasses 122 x 157 x 93 mm (AP by transverse by CC) for an estimated intra-abdominal volume of at least 890 mL. Regional mass effect on bowel and mesentery. No gas within the collection. Background generalized mesenteric and omental edema similar  to the August CT. No abnormally dilated bowel loops. Large bowel retained stool and also fairly extensive diverticulosis of the descending and sigmoid colon. Generalized mild bowel wall thickening has not significantly changed from last month. Air in fluid in the stomach. Mostly diminutive duodenum. Vascular/Lymphatic: Normal caliber abdominal aorta. Blood pool hypodensity suggesting anemia. Relatively mild Aortoiliac calcified atherosclerosis. Small retroperitoneal lymph nodes appear stable and may be End stage renal disease related. No enlarged or progressive lymph nodes. Reproductive: Loculated peritoneal fluid collection with fairly developed rind of soft tissue tracks into the right inguinal canal, with estimated fluid volume in the canal of 200 mL (series 8, image 7), similar to mildly increased from last month. Superimposed generalized scrotal edema. Other: Presacral stranding, not significantly changed. No layering free fluid in the pelvis. Musculoskeletal: No acute osseous abnormality identified. IMPRESSION: 1. Large, loculated/organized peritoneal fluid collection mostly occupying the left abdomen and midline lower abdomen (volume there at least 890 mL) and tracks into the right inguinal canal, with inguinal/scrotal fluid volume estimated at 200 mL. Also tenuous communication of this collection also with a small right inferior hepatic lobe subcapsular fluid collection which has not progressed from last month. 2. Generalized omental,  mesenteric edema or congestion similar to last month. Mild generalized bowel wall thickening, stable, without acute bowel obstruction. 3. Renal atrophy.  Cardiomegaly.  Lung bases are clear. 4.  Aortic Atherosclerosis (ICD10-I70.0). Electronically Signed   By: VEAR Hurst M.D.   On: 01/24/2024 06:35    Microbiology: Recent Results (from the past 240 hours)  Resp panel by RT-PCR (RSV, Flu A&B, Covid) Anterior Nasal Swab     Status: Abnormal   Collection Time: 02/01/24 10:40 AM   Specimen: Anterior Nasal Swab  Result Value Ref Range Status   SARS Coronavirus 2 by RT PCR POSITIVE (A) NEGATIVE Final   Influenza A by PCR NEGATIVE NEGATIVE Final   Influenza B by PCR NEGATIVE NEGATIVE Final    Comment: (NOTE) The Xpert Xpress SARS-CoV-2/FLU/RSV plus assay is intended as an aid in the diagnosis of influenza from Nasopharyngeal swab specimens and should not be used as a sole basis for treatment. Nasal washings and aspirates are unacceptable for Xpert Xpress SARS-CoV-2/FLU/RSV testing.  Fact Sheet for Patients: BloggerCourse.com  Fact Sheet for Healthcare Providers: SeriousBroker.it  This test is not yet approved or cleared by the [place]  FDA and has been authorized for detection and/or diagnosis of SARS-CoV-2 by FDA under an Emergency Use Authorization (EUA). This EUA will remain in effect (meaning this test can be used) for the duration of the COVID-19 declaration under Section 564(b)(1) of the Act, 21 U.S.C. section 360bbb-3(b)(1), unless the authorization is terminated or revoked.     Resp Syncytial Virus by PCR NEGATIVE NEGATIVE Final    Comment: (NOTE) Fact Sheet for Patients: BloggerCourse.com  Fact Sheet for Healthcare Providers: SeriousBroker.it  This test is not yet approved or cleared by the [place]  FDA and has been authorized for detection and/or diagnosis of  SARS-CoV-2 by FDA under an Emergency Use Authorization (EUA). This EUA will remain in effect (meaning this test can be used) for the duration of the COVID-19 declaration under Section 564(b)(1) of the Act, 21 U.S.C. section 360bbb-3(b)(1), unless the authorization is terminated or revoked.  Performed at [facility], [address], [place], [place] [address]   Blood culture (routine x 2)     Status: None (Preliminary result)   Collection Time: 02/02/24  2:06 PM   Specimen: BLOOD RIGHT  ARM  Result Value Ref Range Status   Specimen Description BLOOD RIGHT ARM  Final   Special Requests   Final    BOTTLES DRAWN AEROBIC ONLY Blood Culture results may not be optimal due to an inadequate volume of blood received in culture bottles   Culture   Final    NO GROWTH 2 DAYS Performed at [facility], [address] [place], [place] [address]    Report Status PENDING  Incomplete  Blood culture (routine x 2)     Status: None (Preliminary result)   Collection Time: 02/02/24  3:05 PM   Specimen: BLOOD  Result Value Ref Range Status   Specimen Description BLOOD SITE NOT SPECIFIED  Final   Special Requests   Final    BOTTLES DRAWN AEROBIC ONLY Blood Culture results may not be optimal due to an inadequate volume of blood received in culture bottles   Culture   Final    NO GROWTH 2 DAYS Performed at [facility], [address], [place], [place] [address]    Report Status PENDING  Incomplete     Labs: Basic Metabolic Panel: Recent Labs  Lab 01/29/24 0453 01/30/24 0144 01/31/24 0338 02/01/24 1217 02/01/24 1218 02/02/24 1406 02/03/24 0257  NA 133* 132* 132* 129* 130* 129* 130*  K 3.1* 3.5 3.7 4.0 3.9 4.2 3.5  CL 94* 96* 95* 91*  --  90* 91*  CO2 23 26 24 23   --  22 25  GLUCOSE 87 99 88 87  --  97 101*  BUN 50* 32* 44* 60*  --  71* 39*  CREATININE 4.38* 3.14* 3.92* 4.88*  --  5.43* 3.57*  CALCIUM  7.4* 7.5* 7.5* 7.7*  --  7.7* 7.7*  MG 2.1 1.9 1.9 2.1  --   --  1.9    Liver Function Tests: Recent Labs  Lab 01/29/24 0453 01/30/24 0144 01/31/24 0338 02/01/24 1217 02/02/24 1406  AST 17 19 19 30 27   ALT 9 9 8 10 9   ALKPHOS 83 84 78 85 85  BILITOT 1.1 0.9 1.0 0.8 0.7  PROT 4.7* 4.8* 4.7* 5.3* 5.1*  ALBUMIN  1.5* 1.5* <1.5* 1.7* 1.6*   No results for input(s): LIPASE, AMYLASE in the last 168 hours. No results for input(s): AMMONIA in the last 168 hours. CBC: Recent Labs  Lab 01/29/24 0453 01/30/24 0144 01/31/24 0338 02/01/24 1217 02/01/24 1218 02/02/24 1406 02/03/24 0257  WBC 12.5* 12.2* 13.6* 17.9*  --  17.5* 16.9*  NEUTROABS 12.3* 10.8* 11.9*  --   --   --   --   HGB 9.7* 9.4* 9.5* 10.5* 11.2* 10.0* 9.1*  HCT 28.4* 27.6* 28.8* 32.6* 33.0* 30.8* 27.6*  MCV 85.8 86.5 88.3 89.8  --  89.0 89.0  PLT 77* 73* 62* 71*  --  50* 45*   Cardiac Enzymes: Recent Labs  Lab 02/01/24 1217  CKTOTAL 33*   BNP: BNP (last 3 results) Recent Labs    12/14/23 1150 02/01/24 1217 02/02/24 1406  BNP 1,756.7* 610.3* 588.3*    ProBNP (last 3 results) No results for input(s): PROBNP in the last 8760 hours.  CBG: Recent Labs  Lab 02/01/24 1034 02/03/24 1559 02/03/24 1629  GLUCAP 82 55* 115*    Signed:  Colen Grimes MD   [facility] 02/04/2024, 2:12 PM

## 2024-02-04 NOTE — Progress Notes (Signed)
 [facility] picked up patient. IV dilaudid  d/c'ed. IV removed. 35cc wasted in pyxis with Gladis Fail, RN.

## 2024-02-04 NOTE — TOC Transition Note (Addendum)
 Transition of Care [facility]) - Discharge Note   Patient Details  Name: [name] MRN: [id number] Date of Birth: 16-Jun-1970  Transition of Care [facility] CM/SW Contact:  Corean JAYSON Canary, RN Phone Number: 02/04/2024, 9:41 AM   Clinical Narrative:     Patient will go home with hospice, Reached out to sister Ms Teresa. She will take him home with hospice . She lives in [place]. She wants a highly rated hospice that will go there.  Reached out to [facility], they will reach out to Ms Lincroft.  Pending discharge today to her home 1145  Waiting on word for hospice to speak to sister, 1400  Hospice liaison will be at sisters house [address] [place], [place] [address]  Spoke to Ms Teresa the patents sister, oxygen should be delivered soon, the liaison will be out there from [facility] at [facility] Provider aware,  1545 ready to call [facility]. [facility] called,  the home is over 50 miles from the hospital, therefore they need  1805.72 up front or a LOG from Hospice. Messaged with team to figure this out.  1626 Hospice will not do LOG, called Sister Ms Teresa, she said  that's fine. Will have her call  [facility]  [phone] for payment.  They will set up transfer once payment is processed.    Final next level of care: Home w Hospice Care Barriers to Discharge: Continued Medical Work up   Patient Goals and CMS Choice            Discharge Placement                 Home with Hospice      Discharge Plan and Services Additional resources added to the After Visit Summary for   In-house Referral: Clinical Social Work                        HH Arranged:  Production designer, theatre/television/film)          Social Drivers of Health (SDOH) Interventions SDOH Screenings   Food Insecurity: No Food Insecurity (02/02/2024)  Housing: Low Risk  (02/02/2024)  Transportation Needs: No Transportation Needs (02/02/2024)  Utilities: Not At Risk (02/02/2024)  Depression (PHQ2-9): Low Risk  (01/02/2022)  Tobacco Use: Low Risk  (02/01/2024)      Readmission Risk Interventions    02/03/2024    3:17 PM 01/03/2024   11:13 AM  Readmission Risk Prevention Plan  Transportation Screening Complete Complete  HRI or Home Care Consult  Complete  Social Work Consult for Recovery Care Planning/Counseling  Complete  Palliative Care Screening  Not Applicable  Medication Review Oceanographer) Complete Referral to Pharmacy  PCP or Specialist appointment within 3-5 days of discharge Complete   HRI or Home Care Consult Complete   SW Recovery Care/Counseling Consult Complete   Palliative Care Screening Complete   Skilled Nursing Facility Complete

## 2024-02-04 NOTE — Progress Notes (Signed)
 Comfort measures noted. Contacted out-pt HD clinic, [facility] at this time to inform of this. No further support needed at this time.   Lavanda Earnstine Meinders Dialysis Navigator [phone]

## 2024-02-04 NOTE — Care Management (Signed)
 [facility] called

## 2024-02-04 NOTE — Progress Notes (Signed)
 [facility] [phone] [facility] Liaison Note  Received request from [place] Transitions of Care Manager, for hospice services at home after discharge. Spoke with patient's sister Rosaline to initiate education related to hospice philosophy, services, and team approach to care. She verbalized understanding of information given. Per discussion, the plan is for discharge home by [facility] on 10.10.25. [facility]  DME needs discussed. Rosaline is requesting oxygen only. The address has been verified and is correct in the chart. Rosaline is the family contact to arrange time of equipment delivery. Please send signed and completed DNR home with patient/family. Please provide prescriptions at discharge as needed to ensure ongoing symptom management.   [facility] information and contact numbers given to [place]. Above information shared with [facility] Transitions of Care Manager. Please call with any questions or concerns.   Thank you for the opportunity to participate in this patient's care.   Amy Darien BSN, RN [facility] Liaison [phone]

## 2024-02-07 ENCOUNTER — Telehealth: Payer: Self-pay

## 2024-02-07 LAB — CULTURE, BLOOD (ROUTINE X 2)
Culture: NO GROWTH
Culture: NO GROWTH

## 2024-02-07 NOTE — Transitions of Care (Post Inpatient/ED Visit) (Signed)
   02/07/2024  Name: MRK BUZBY MRN: [id number] DOB: October 15, 1970  Today's TOC FU Call Status: Today's TOC FU Call Status:: Successful TOC FU Call Completed Unsuccessful Call (1st Attempt) Date: 02/07/24 [facility] FU Call Complete Date: 02/07/24 (Call to [facility]) Patient's Name and Date of Birth confirmed.  Transition Care Management Follow-up Telephone Call Date of Discharge: 2024-03-03   [facility] RN made outreach to verify receipt of hospice referral and start of care. RN confirmed that patient is actively enrolled in hospice program. Hospice Agency:  Civil engineer, contracting RN spoke with: Reid Start of Care Date: 03/03/2024   However, deceased 2024/03/05 No further interventions at this time.   Richerd Fish, RN, BSN, CCM [facility], [facility] RN Care Manager Direct Dial: [phone]

## 2024-02-10 LAB — MTB-RIF NAA W AFB CULT, NON-SPUTUM: Acid Fast Culture: NEGATIVE

## 2024-02-11 LAB — ACID FAST CULTURE WITH REFLEXED SENSITIVITIES (MYCOBACTERIA): Acid Fast Culture: NEGATIVE

## 2024-02-26 DEATH — deceased
